# Patient Record
Sex: Male | Born: 1970 | Race: White | Hispanic: No | Marital: Single | Smoking: Never smoker
Health system: Southern US, Community
[De-identification: ages and names within clinical notes are randomized; demographics above are authoritative.]

## PROBLEM LIST (undated history)

## (undated) ENCOUNTER — Inpatient Hospital Stay: Payer: Self-pay | Admitting: *Deleted

## (undated) DIAGNOSIS — E039 Hypothyroidism, unspecified: Secondary | ICD-10-CM

## (undated) DIAGNOSIS — D229 Melanocytic nevi, unspecified: Secondary | ICD-10-CM

## (undated) DIAGNOSIS — Z87898 Personal history of other specified conditions: Secondary | ICD-10-CM

## (undated) DIAGNOSIS — J189 Pneumonia, unspecified organism: Secondary | ICD-10-CM

## (undated) DIAGNOSIS — T7840XA Allergy, unspecified, initial encounter: Secondary | ICD-10-CM

## (undated) DIAGNOSIS — E079 Disorder of thyroid, unspecified: Secondary | ICD-10-CM

## (undated) DIAGNOSIS — E220 Acromegaly and pituitary gigantism: Secondary | ICD-10-CM

## (undated) DIAGNOSIS — M25561 Pain in right knee: Secondary | ICD-10-CM

## (undated) DIAGNOSIS — E349 Endocrine disorder, unspecified: Secondary | ICD-10-CM

## (undated) DIAGNOSIS — Z9989 Dependence on other enabling machines and devices: Secondary | ICD-10-CM

## (undated) DIAGNOSIS — M1990 Unspecified osteoarthritis, unspecified site: Secondary | ICD-10-CM

## (undated) DIAGNOSIS — Z5189 Encounter for other specified aftercare: Secondary | ICD-10-CM

## (undated) DIAGNOSIS — I10 Essential (primary) hypertension: Secondary | ICD-10-CM

## (undated) DIAGNOSIS — C801 Malignant (primary) neoplasm, unspecified: Secondary | ICD-10-CM

## (undated) DIAGNOSIS — M25562 Pain in left knee: Secondary | ICD-10-CM

## (undated) HISTORY — DX: Dependence on other enabling machines and devices: Z99.89

## (undated) HISTORY — DX: Personal history of other specified conditions: Z87.898

## (undated) HISTORY — DX: Acromegaly and pituitary gigantism: E22.0

## (undated) HISTORY — DX: Disorder of thyroid, unspecified: E07.9

## (undated) HISTORY — DX: Encounter for other specified aftercare: Z51.89

## (undated) HISTORY — DX: Pain in right knee: M25.561

## (undated) HISTORY — PX: THORACIC AORTIC ANEURYSM REPAIR: SHX799

## (undated) HISTORY — PX: CRANIOTOMY FOR TUMOR: SUR345

## (undated) HISTORY — DX: Allergy, unspecified, initial encounter: T78.40XA

## (undated) HISTORY — PX: CARDIAC ELECTROPHYSIOLOGY STUDY AND ABLATION: SHX1294

## (undated) HISTORY — PX: SPINAL FUSION: SHX223

## (undated) HISTORY — DX: Endocrine disorder, unspecified: E34.9

## (undated) HISTORY — DX: Pain in left knee: M25.562

---
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
## 2005-01-20 HISTORY — PX: CERVICAL DISC SURGERY: SHX588

## 2006-04-11 ENCOUNTER — Ambulatory Visit: Payer: Self-pay | Admitting: Internal Medicine

## 2006-04-11 ENCOUNTER — Inpatient Hospital Stay (HOSPITAL_COMMUNITY): Admission: EM | Admit: 2006-04-11 | Discharge: 2006-04-14 | Payer: Self-pay | Admitting: Emergency Medicine

## 2010-05-03 ENCOUNTER — Other Ambulatory Visit: Payer: Self-pay | Admitting: Emergency Medicine

## 2010-05-03 DIAGNOSIS — J329 Chronic sinusitis, unspecified: Secondary | ICD-10-CM

## 2011-01-23 ENCOUNTER — Ambulatory Visit (INDEPENDENT_AMBULATORY_CARE_PROVIDER_SITE_OTHER): Payer: Managed Care, Other (non HMO)

## 2011-01-23 DIAGNOSIS — E220 Acromegaly and pituitary gigantism: Secondary | ICD-10-CM

## 2011-02-21 ENCOUNTER — Ambulatory Visit (INDEPENDENT_AMBULATORY_CARE_PROVIDER_SITE_OTHER): Payer: Managed Care, Other (non HMO) | Admitting: Emergency Medicine

## 2011-02-21 DIAGNOSIS — E236 Other disorders of pituitary gland: Secondary | ICD-10-CM

## 2011-02-21 DIAGNOSIS — E220 Acromegaly and pituitary gigantism: Secondary | ICD-10-CM

## 2011-02-21 MED ORDER — OCTREOTIDE ACETATE 30 MG IM KIT
30.0000 mg | PACK | INTRAMUSCULAR | Status: AC
  Administered 2011-02-21 – 2011-06-06 (×2): 30 mg via INTRAMUSCULAR

## 2011-02-24 NOTE — Progress Notes (Signed)
Patient interspersed monthly injection as ordered by his specialist that [facility]. This is for history of acromegaly. He brings the medication with him and and medications administered in our office

## 2011-04-01 ENCOUNTER — Ambulatory Visit (INDEPENDENT_AMBULATORY_CARE_PROVIDER_SITE_OTHER): Payer: Managed Care, Other (non HMO) | Admitting: Family Medicine

## 2011-04-01 DIAGNOSIS — E860 Dehydration: Secondary | ICD-10-CM

## 2011-04-01 DIAGNOSIS — E220 Acromegaly and pituitary gigantism: Secondary | ICD-10-CM | POA: Insufficient documentation

## 2011-04-01 DIAGNOSIS — R112 Nausea with vomiting, unspecified: Secondary | ICD-10-CM

## 2011-04-01 DIAGNOSIS — I429 Cardiomyopathy, unspecified: Secondary | ICD-10-CM | POA: Insufficient documentation

## 2011-04-01 DIAGNOSIS — R197 Diarrhea, unspecified: Secondary | ICD-10-CM

## 2011-04-01 DIAGNOSIS — I456 Pre-excitation syndrome: Secondary | ICD-10-CM | POA: Insufficient documentation

## 2011-04-01 DIAGNOSIS — R1110 Vomiting, unspecified: Secondary | ICD-10-CM

## 2011-04-01 LAB — POCT CBC
Granulocyte percent: 71.3 %G (ref 37–80)
HCT, POC: 40.1 % — AB (ref 43.5–53.7)
Hemoglobin: 13.1 g/dL — AB (ref 14.1–18.1)
Lymph, poc: 1.1 (ref 0.6–3.4)
MCH, POC: 31.2 pg (ref 27–31.2)
MCHC: 32.7 g/dL (ref 31.8–35.4)
MCV: 95.5 fL (ref 80–97)
MID (cbc): 0.5 (ref 0–0.9)
MPV: 8.7 fL (ref 0–99.8)
POC Granulocyte: 3.9 (ref 2–6.9)
POC LYMPH PERCENT: 19.4 %L (ref 10–50)
POC MID %: 9.3 %M (ref 0–12)
Platelet Count, POC: 146 10*3/uL (ref 142–424)
RBC: 4.2 M/uL — AB (ref 4.69–6.13)
RDW, POC: 12.8 %
WBC: 5.5 10*3/uL (ref 4.6–10.2)

## 2011-04-01 LAB — BASIC METABOLIC PANEL
BUN: 22 mg/dL (ref 6–23)
CO2: 27 mEq/L (ref 19–32)
Calcium: 9.4 mg/dL (ref 8.4–10.5)
Chloride: 98 mEq/L (ref 96–112)
Creat: 0.7 mg/dL (ref 0.50–1.35)
Glucose, Bld: 117 mg/dL — ABNORMAL HIGH (ref 70–99)
Potassium: 3.9 mEq/L (ref 3.5–5.3)
Sodium: 135 mEq/L (ref 135–145)

## 2011-04-01 MED ORDER — ONDANSETRON 4 MG PO TBDP: 4.0000 mg | ORAL_TABLET | Freq: Three times a day (TID) | ORAL | Status: AC | PRN

## 2011-04-01 NOTE — Progress Notes (Signed)
Subjective:    Patient ID: Mark Gallagher, male    DOB: 1970/05/28, 41 y.o.   MRN: [id number]  HPI  Mark Gallagher is a 41 y.o. male  Vomiting and diarrhea -6 episodes of each - 2 days ago from noon to 6pm., no vomiting or diarrhea past 2 days, but still feels fatigued, myalgias, chills and HA at times.  Decreased appetite.  Able to drink water, just food not tasting good.  Soup and saltine crackers, cheese sandwich ok.  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    No known sick contacts, no new meds/supplements.  Review of Systems  Constitutional: Positive for fatigue.  Gastrointestinal: Positive for nausea, vomiting and diarrhea.  Genitourinary: Positive for decreased urine volume. Negative for dysuria, urgency, frequency and flank pain.  Skin: Negative for rash.  Neurological: Positive for headaches. Negative for dizziness, weakness and numbness.       Generalized weak.       Objective:   Physical Exam  Constitutional: He is oriented to person, place, and time. He appears well-developed and well-nourished.        Appears fatigued, but not nontoxic.  HENT:  Head: Atraumatic. Macrocephalic.  Mouth/Throat: Mucous membranes are dry.  Eyes: Conjunctivae and EOM are normal. Pupils are equal, round, and reactive to light.  Neck: Neck supple. No thyromegaly present.  Cardiovascular: Normal rate, regular rhythm, normal heart sounds and intact distal pulses.   Pulmonary/Chest: Effort normal and breath sounds normal.  Abdominal: Soft. Bowel sounds are normal. He exhibits no distension. There is no tenderness. There is no rebound.  Lymphadenopathy:    He has no cervical adenopathy.  Neurological: He is alert and oriented to person, place, and time.  Skin: Skin is warm and dry.  Psychiatric: He has a normal mood and affect. His behavior is normal.    Results for orders placed in  visit on 04/01/11  POCT CBC      Component Value Range   WBC 5.5  4.6 - 10.2 (K/uL)   Lymph, poc 1.1  0.6 - 3.4    POC LYMPH PERCENT 19.4  10 - 50 (%L)   MID (cbc) 0.5  0 - 0.9    POC MID % 9.3  0 - 12 (%M)   POC Granulocyte 3.9  2 - 6.9    Granulocyte percent 71.3  37 - 80 (%G)   RBC 4.20 (*) 4.69 - 6.13 (M/uL)   Hemoglobin 13.1 (*) 14.1 - 18.1 (g/dL)   HCT, POC 40.9 (*) 81.1 - 53.7 (%)   MCV 95.5  80 - 97 (fL)   MCH, POC 31.2  27 - 31.2 (pg)   MCHC 32.7  31.8 - 35.4 (g/dL)   RDW, POC 91.4     Platelet Count, POC 146  142 - 424 (K/uL)   MPV 8.7  0 - 99.8 (fL)         Assessment & Plan:   Mark Gallagher is a 41 y.o. male  1. Vomiting  POCT CBC, Basic metabolic panel  2. Diarrhea  POCT CBC, Basic metabolic panel  3. Volume depletion      Resolved diarrhea, vomiting.  Suspected gastroenteritis with residual fatigue, volume depletion. Tolerating po fluids, and few solids.  Reassuring CBC.  Check BMP.  Zofran 4mg  q8h prn, push fluids/electrolyte drinks, slow resumption of solids, BRAT diet. Out of work for 3 days -  can return sooner if feeling well.  RTC if not improving, or sooner if worsening.  Borderline hemglobin - recheck next 1 month, sooner if new symptoms.

## 2011-04-01 NOTE — Patient Instructions (Signed)
Continue to slowly increase your food intake - Bananas, rice, applesauce, toast ok to start first.  Continue to drink plenty of fluids, and rest as needed.  If you are not improving in the next 2-3 days - return to clinic, sooner or to an emergency room if any acute worsening. Dehydration, Adult Dehydration is when you lose more fluids from the body than you take in. Vital organs like the kidneys, brain, and heart cannot function without a proper amount of fluids and salt. Any loss of fluids from the body can cause dehydration.  CAUSES   Vomiting.   Diarrhea.   Excessive sweating.   Excessive urine output.   Fever.  SYMPTOMS  Mild dehydration  Thirst.   Dry lips.   Slightly dry mouth.  Moderate dehydration  Very dry mouth.   Sunken eyes.   Skin does not bounce back quickly when lightly pinched and released.   Dark urine and decreased urine production.   Decreased tear production.   Headache.  Severe dehydration  Very dry mouth.   Extreme thirst.   Rapid, weak pulse (more than 100 beats per minute at rest).   Cold hands and feet.   Not able to sweat in spite of heat and temperature.   Rapid breathing.   Blue lips.   Confusion and lethargy.   Difficulty being awakened.   Minimal urine production.   No tears.  DIAGNOSIS  Your caregiver will diagnose dehydration based on your symptoms and your exam. Blood and urine tests will help confirm the diagnosis. The diagnostic evaluation should also identify the cause of dehydration. TREATMENT  Treatment of mild or moderate dehydration can often be done at home by increasing the amount of fluids that you drink. It is best to drink small amounts of fluid more often. Drinking too much at one time can make vomiting worse. Refer to the home care instructions below. Severe dehydration needs to be treated at the hospital where you will probably be given intravenous (IV) fluids that contain water and electrolytes. HOME CARE  INSTRUCTIONS   Ask your caregiver about specific rehydration instructions.   Drink enough fluids to keep your urine clear or pale yellow.   Drink small amounts frequently if you have nausea and vomiting.   Eat as you normally do.   Avoid:   Foods or drinks high in sugar.   Carbonated drinks.   Juice.   Extremely hot or cold fluids.   Drinks with caffeine.   Fatty, greasy foods.   Alcohol.   Tobacco.   Overeating.   Gelatin desserts.   Wash your hands well to avoid spreading bacteria and viruses.   Only take over-the-counter or prescription medicines for pain, discomfort, or fever as directed by your caregiver.   Ask your caregiver if you should continue all prescribed and over-the-counter medicines.   Keep all follow-up appointments with your caregiver.  SEEK MEDICAL CARE IF:  You have abdominal pain and it increases or stays in one area (localizes).   You have a rash, stiff neck, or severe headache.   You are irritable, sleepy, or difficult to awaken.   You are weak, dizzy, or extremely thirsty.  SEEK IMMEDIATE MEDICAL CARE IF:   You are unable to keep fluids down or you get worse despite treatment.   You have frequent episodes of vomiting or diarrhea.   You have blood or green matter (bile) in your vomit.   You have blood in your stool or your stool looks black  and tarry.   You have not urinated in 6 to 8 hours, or you have only urinated a small amount of very dark urine.   You have a fever.   You faint.  MAKE SURE YOU:   Understand these instructions.   Will watch your condition.   Will get help right away if you are not doing well or get worse.  Document Released: 01/06/2005 Document Revised: 12/26/2010 Document Reviewed: 08/26/2010 [facility] Patient Information 2012 [place], [place].

## 2011-04-08 ENCOUNTER — Ambulatory Visit (INDEPENDENT_AMBULATORY_CARE_PROVIDER_SITE_OTHER): Payer: Managed Care, Other (non HMO) | Admitting: Physician Assistant

## 2011-04-08 ENCOUNTER — Other Ambulatory Visit: Payer: Self-pay | Admitting: Emergency Medicine

## 2011-04-08 DIAGNOSIS — E220 Acromegaly and pituitary gigantism: Secondary | ICD-10-CM

## 2011-04-08 DIAGNOSIS — E291 Testicular hypofunction: Secondary | ICD-10-CM

## 2011-04-08 MED ORDER — OCTREOTIDE ACETATE 30 MG IM KIT
30.0000 mg | PACK | Freq: Once | INTRAMUSCULAR | Status: AC
  Administered 2011-04-08: 30 mg via INTRAMUSCULAR

## 2011-04-08 MED ORDER — OCTREOTIDE ACETATE 30 MG IM KIT: 30.0000 mg | PACK | INTRAMUSCULAR | Status: DC

## 2011-04-11 ENCOUNTER — Ambulatory Visit: Payer: Managed Care, Other (non HMO) | Admitting: *Deleted

## 2011-04-21 ENCOUNTER — Other Ambulatory Visit: Payer: Self-pay

## 2011-04-21 DIAGNOSIS — E291 Testicular hypofunction: Secondary | ICD-10-CM

## 2011-05-09 ENCOUNTER — Ambulatory Visit (INDEPENDENT_AMBULATORY_CARE_PROVIDER_SITE_OTHER): Payer: Managed Care, Other (non HMO) | Admitting: Physician Assistant

## 2011-05-09 ENCOUNTER — Other Ambulatory Visit: Payer: Self-pay | Admitting: Emergency Medicine

## 2011-05-09 DIAGNOSIS — E220 Acromegaly and pituitary gigantism: Secondary | ICD-10-CM

## 2011-05-09 MED ORDER — OCTREOTIDE ACETATE 30 MG IM KIT
30.0000 mg | PACK | Freq: Once | INTRAMUSCULAR | Status: AC
  Administered 2011-05-09: 30 mg via INTRAMUSCULAR

## 2011-06-06 ENCOUNTER — Ambulatory Visit (INDEPENDENT_AMBULATORY_CARE_PROVIDER_SITE_OTHER): Payer: Managed Care, Other (non HMO) | Admitting: Physician Assistant

## 2011-06-06 ENCOUNTER — Other Ambulatory Visit: Payer: Self-pay | Admitting: Family Medicine

## 2011-06-06 DIAGNOSIS — E220 Acromegaly and pituitary gigantism: Secondary | ICD-10-CM

## 2011-06-06 DIAGNOSIS — I456 Pre-excitation syndrome: Secondary | ICD-10-CM

## 2011-06-06 MED ORDER — OCTREOTIDE ACETATE 30 MG IM KIT
30.0000 mg | PACK | INTRAMUSCULAR | Status: AC
  Administered 2011-07-11 – 2012-09-06 (×8): 30 mg via INTRAMUSCULAR

## 2011-06-06 MED ORDER — OCTREOTIDE ACETATE 30 MG IM KIT
30.0000 mg | PACK | Freq: Once | INTRAMUSCULAR | Status: AC
  Administered 2013-02-08: 30 mg via INTRAMUSCULAR

## 2011-06-06 MED ORDER — OCTREOTIDE ACETATE 30 MG IM KIT: 30.0000 mg | PACK | Freq: Once | INTRAMUSCULAR | Status: DC

## 2011-07-11 ENCOUNTER — Ambulatory Visit (INDEPENDENT_AMBULATORY_CARE_PROVIDER_SITE_OTHER): Payer: Managed Care, Other (non HMO) | Admitting: Physician Assistant

## 2011-07-11 DIAGNOSIS — E220 Acromegaly and pituitary gigantism: Secondary | ICD-10-CM

## 2011-08-08 ENCOUNTER — Ambulatory Visit (INDEPENDENT_AMBULATORY_CARE_PROVIDER_SITE_OTHER): Payer: Managed Care, Other (non HMO) | Admitting: Physician Assistant

## 2011-08-08 DIAGNOSIS — E220 Acromegaly and pituitary gigantism: Secondary | ICD-10-CM

## 2011-09-05 ENCOUNTER — Ambulatory Visit (INDEPENDENT_AMBULATORY_CARE_PROVIDER_SITE_OTHER): Payer: Managed Care, Other (non HMO) | Admitting: Physician Assistant

## 2011-09-05 DIAGNOSIS — E220 Acromegaly and pituitary gigantism: Secondary | ICD-10-CM

## 2011-10-01 ENCOUNTER — Ambulatory Visit (INDEPENDENT_AMBULATORY_CARE_PROVIDER_SITE_OTHER): Payer: Managed Care, Other (non HMO) | Admitting: Physician Assistant

## 2011-10-01 DIAGNOSIS — E220 Acromegaly and pituitary gigantism: Secondary | ICD-10-CM

## 2011-10-01 MED ORDER — OCTREOTIDE ACETATE 30 MG IM KIT
30.0000 mg | PACK | Freq: Once | INTRAMUSCULAR | Status: AC
  Administered 2011-10-01: 30 mg via INTRAMUSCULAR

## 2011-10-01 MED ORDER — OCTREOTIDE ACETATE 30 MG IM KIT: 30.0000 mg | PACK | INTRAMUSCULAR | Status: DC

## 2011-11-07 ENCOUNTER — Ambulatory Visit (INDEPENDENT_AMBULATORY_CARE_PROVIDER_SITE_OTHER): Payer: Managed Care, Other (non HMO) | Admitting: *Deleted

## 2011-11-07 DIAGNOSIS — E220 Acromegaly and pituitary gigantism: Secondary | ICD-10-CM

## 2011-11-07 MED ORDER — OCTREOTIDE ACETATE 30 MG IM KIT: 30.0000 mg | PACK | INTRAMUSCULAR | Status: DC

## 2011-11-07 MED ORDER — OCTREOTIDE ACETATE 30 MG IM KIT
30.0000 mg | PACK | Freq: Once | INTRAMUSCULAR | Status: AC
  Administered 2011-11-07: 30 mg via INTRAMUSCULAR

## 2011-11-11 NOTE — Progress Notes (Signed)
  Subjective:    Patient ID: Mark Gallagher, male    DOB: 08/30/1970, 41 y.o.   MRN: [id number]  HPI    Review of Systems     Objective:   Physical Exam        Assessment & Plan:  Patient here for injection only 

## 2011-12-11 ENCOUNTER — Ambulatory Visit (INDEPENDENT_AMBULATORY_CARE_PROVIDER_SITE_OTHER): Payer: Managed Care, Other (non HMO) | Admitting: *Deleted

## 2011-12-11 DIAGNOSIS — E220 Acromegaly and pituitary gigantism: Secondary | ICD-10-CM

## 2011-12-11 MED ORDER — OCTREOTIDE ACETATE 30 MG IM KIT
30.0000 mg | PACK | Freq: Once | INTRAMUSCULAR | Status: AC
  Administered 2011-12-11: 30 mg via INTRAMUSCULAR

## 2011-12-11 MED ORDER — OCTREOTIDE ACETATE 30 MG IM KIT: 30.0000 mg | PACK | INTRAMUSCULAR | Status: DC

## 2012-01-06 ENCOUNTER — Ambulatory Visit (INDEPENDENT_AMBULATORY_CARE_PROVIDER_SITE_OTHER): Payer: Managed Care, Other (non HMO) | Admitting: *Deleted

## 2012-01-06 DIAGNOSIS — E220 Acromegaly and pituitary gigantism: Secondary | ICD-10-CM

## 2012-01-06 MED ORDER — OCTREOTIDE ACETATE 30 MG IM KIT
30.0000 mg | PACK | Freq: Once | INTRAMUSCULAR | Status: AC
  Administered 2012-01-06: 30 mg via INTRAMUSCULAR

## 2012-01-06 NOTE — Progress Notes (Signed)
  Subjective:    Patient ID: Mark Gallagher, male    DOB: [birth year], 41 y.o.   MRN: [id number]  HPI    Review of Systems     Objective:   Physical Exam        Assessment & Plan:  Patient here for Sandostatin LAR depot 30 mg  Injection only.

## 2012-01-18 ENCOUNTER — Ambulatory Visit (INDEPENDENT_AMBULATORY_CARE_PROVIDER_SITE_OTHER): Payer: Managed Care, Other (non HMO) | Admitting: Family Medicine

## 2012-01-18 ENCOUNTER — Encounter: Payer: Self-pay | Admitting: Family Medicine

## 2012-01-18 VITALS — BP 113/73 | HR 80 | Temp 98.0°F | Resp 18 | Ht 79.0 in | Wt 237.8 lb

## 2012-01-18 DIAGNOSIS — J069 Acute upper respiratory infection, unspecified: Secondary | ICD-10-CM

## 2012-01-18 DIAGNOSIS — J029 Acute pharyngitis, unspecified: Secondary | ICD-10-CM

## 2012-01-18 LAB — POCT RAPID STREP A (OFFICE): Rapid Strep A Screen: NEGATIVE

## 2012-01-18 MED ORDER — HYDROCOD POLST-CHLORPHEN POLST 10-8 MG/5ML PO LQCR: 5.0000 mL | Freq: Two times a day (BID) | ORAL | Status: DC | PRN

## 2012-01-18 NOTE — Progress Notes (Signed)
Subjective:    Patient ID: Mark Gallagher, male    DOB: 01/22/1970, 41 y.o.   MRN: [id number] Chief Complaint  Patient presents with  . Cough    mostly dry cough nasal congestion scratchy throat x 2 days    HPI  2d prev dev sore throat which has progressively worsened and developed nasal congestion and chest congestion, now more cough.  Has a h/o pneumonia.  Low grade fever, just illness, fatigue, sleeping a lot.  No myagias/arthralgias, no GI/GU sxs, no chills, no ear pain, HAs or sinus pressure.  No past medical history on file. Current Outpatient Prescriptions on File Prior to Visit  Medication Sig Dispense Refill  . levothyroxine (SYNTHROID, LEVOTHROID) 200 MCG tablet Take 200 mcg by mouth daily.      Marland Kitchen octreotide (SANDOSTATIN LAR DEPOT) 30 MG injection Inject 30 mg into the muscle every 28 (twenty-eight) days.  1 each  0   Current Facility-Administered Medications on File Prior to Visit  Medication Dose Route Frequency Provider Last Rate Last Dose  . octreotide (SANDOSTATIN LAR) IM injection 30 mg  30 mg Intramuscular Q30 days Collene Gobble, MD   30 mg at 06/06/11 1215  . octreotide (SANDOSTATIN LAR) IM injection 30 mg  30 mg Intramuscular Once Collene Gobble, MD      . octreotide (SANDOSTATIN LAR) IM injection 30 mg  30 mg Intramuscular Q30 days Collene Gobble, MD   30 mg at 09/05/11 1130   No Known Allergies  Review of Systems  Constitutional: Positive for fever, activity change and fatigue. Negative for chills, diaphoresis and appetite change.  HENT: Positive for congestion, sore throat and rhinorrhea. Negative for ear pain, trouble swallowing, neck pain, neck stiffness, voice change and sinus pressure.   Respiratory: Positive for cough. Negative for shortness of breath.   Gastrointestinal: Negative for nausea, vomiting and abdominal pain.  Genitourinary: Negative for dysuria and decreased urine volume.  Musculoskeletal: Negative for myalgias and arthralgias.  Neurological:  Negative for headaches.  Psychiatric/Behavioral: Negative for sleep disturbance.      BP 113/73  Pulse 80  Temp 98 F (36.7 C) (Oral)  Resp 18  Ht 6\' 7"  (2.007 m)  Wt 237 lb 12.8 oz (107.865 kg)  BMI 26.79 kg/m2  SpO2 97% Objective:   Physical Exam  Constitutional: He is oriented to person, place, and time. He appears well-developed and well-nourished. No distress.  HENT:  Head: Normocephalic and atraumatic.  Right Ear: Tympanic membrane, external ear and ear canal normal.  Left Ear: Tympanic membrane, external ear and ear canal normal.  Nose: Nose normal.  Mouth/Throat: Uvula is midline and mucous membranes are normal. Oropharyngeal exudate, posterior oropharyngeal edema and posterior oropharyngeal erythema present.  Eyes: Conjunctivae normal are normal. No scleral icterus.  Neck: Normal range of motion. Neck supple. No thyromegaly present.  Cardiovascular: Normal rate, regular rhythm, normal heart sounds and intact distal pulses.   Pulmonary/Chest: Effort normal and breath sounds normal. No respiratory distress.  Musculoskeletal: He exhibits no edema.  Lymphadenopathy:    He has no cervical adenopathy.  Neurological: He is alert and oriented to person, place, and time.  Skin: Skin is warm and dry. He is not diaphoretic. No erythema.  Psychiatric: He has a normal mood and affect. His behavior is normal.       Results for orders placed in visit on 01/18/12  POCT RAPID STREP A (OFFICE)      Component Value Range   Rapid Strep A Screen Negative  Negative    Assessment & Plan:   1. Pharyngitis  POCT rapid strep A  2. URI, acute  chlorpheniramine-HYDROcodone (TUSSIONEX PENNKINETIC ER) 10-8 MG/5ML LQCR  Suspect virally mediated.  Push fluids, rest, and treat symptomatically.  RTC if not improving within several days or if worsening.

## 2012-01-28 ENCOUNTER — Ambulatory Visit (INDEPENDENT_AMBULATORY_CARE_PROVIDER_SITE_OTHER): Payer: Managed Care, Other (non HMO) | Admitting: *Deleted

## 2012-01-28 ENCOUNTER — Ambulatory Visit: Payer: Managed Care, Other (non HMO)

## 2012-01-28 ENCOUNTER — Other Ambulatory Visit: Payer: Self-pay | Admitting: Emergency Medicine

## 2012-01-28 DIAGNOSIS — E220 Acromegaly and pituitary gigantism: Secondary | ICD-10-CM

## 2012-01-28 MED ORDER — OCTREOTIDE ACETATE 30 MG IM KIT
30.0000 mg | PACK | INTRAMUSCULAR | Status: DC
  Administered 2012-01-28: 30 mg via INTRAMUSCULAR

## 2012-01-29 DIAGNOSIS — E349 Endocrine disorder, unspecified: Secondary | ICD-10-CM | POA: Insufficient documentation

## 2012-02-27 ENCOUNTER — Ambulatory Visit (INDEPENDENT_AMBULATORY_CARE_PROVIDER_SITE_OTHER): Payer: Managed Care, Other (non HMO) | Admitting: *Deleted

## 2012-02-27 DIAGNOSIS — E220 Acromegaly and pituitary gigantism: Secondary | ICD-10-CM

## 2012-02-27 MED ORDER — OCTREOTIDE ACETATE 30 MG IM KIT
30.0000 mg | PACK | Freq: Once | INTRAMUSCULAR | Status: AC
  Administered 2012-02-27: 30 mg via INTRAMUSCULAR

## 2012-04-07 ENCOUNTER — Ambulatory Visit (INDEPENDENT_AMBULATORY_CARE_PROVIDER_SITE_OTHER): Payer: Managed Care, Other (non HMO) | Admitting: *Deleted

## 2012-04-07 DIAGNOSIS — E220 Acromegaly and pituitary gigantism: Secondary | ICD-10-CM

## 2012-05-11 ENCOUNTER — Ambulatory Visit (INDEPENDENT_AMBULATORY_CARE_PROVIDER_SITE_OTHER): Payer: Managed Care, Other (non HMO) | Admitting: *Deleted

## 2012-05-11 DIAGNOSIS — E220 Acromegaly and pituitary gigantism: Secondary | ICD-10-CM

## 2012-05-11 NOTE — Progress Notes (Signed)
  Subjective:    Patient ID: Mark Gallagher, male    DOB: 29-Aug-1970, 42 y.o.   MRN: [id number]  HPI    Review of Systems     Objective:   Physical Exam        Assessment & Plan:  Patient here for injection only

## 2012-06-02 ENCOUNTER — Ambulatory Visit (INDEPENDENT_AMBULATORY_CARE_PROVIDER_SITE_OTHER): Payer: Managed Care, Other (non HMO) | Admitting: *Deleted

## 2012-06-02 DIAGNOSIS — E220 Acromegaly and pituitary gigantism: Secondary | ICD-10-CM

## 2012-06-02 MED ORDER — OCTREOTIDE ACETATE 30 MG IM KIT
30.0000 mg | PACK | Freq: Once | INTRAMUSCULAR | Status: AC
  Administered 2012-06-02: 30 mg via INTRAMUSCULAR

## 2012-06-02 NOTE — Progress Notes (Signed)
  Subjective:    Patient ID: Mark Gallagher, male    DOB: [birth year], 42 y.o.   MRN: [id number]  HPI    Review of Systems     Objective:   Physical Exam        Assessment & Plan:  Patient seen for injection only.

## 2012-07-02 ENCOUNTER — Ambulatory Visit (INDEPENDENT_AMBULATORY_CARE_PROVIDER_SITE_OTHER): Payer: Managed Care, Other (non HMO) | Admitting: Internal Medicine

## 2012-07-02 ENCOUNTER — Ambulatory Visit (INDEPENDENT_AMBULATORY_CARE_PROVIDER_SITE_OTHER): Payer: Managed Care, Other (non HMO) | Admitting: *Deleted

## 2012-07-02 DIAGNOSIS — E220 Acromegaly and pituitary gigantism: Secondary | ICD-10-CM

## 2012-07-02 DIAGNOSIS — Z209 Contact with and (suspected) exposure to unspecified communicable disease: Secondary | ICD-10-CM

## 2012-07-02 NOTE — Progress Notes (Signed)
He asked for a quick visit to discuss recent complications from a relationship problem Relationship for 3 years with a woman who is an alcoholic and smokes Finally culminated with his first sexual activity ever in January 2014 She is now pregnant The relationship had dissolved before finding out No outside partners were involved He has no STD symptoms He has questions about proof of paternity, the best course of action would not wanting to be a father, ways to handle his family's disappointment, ways to limit any damage to the fetus from alcohol or cigarettes  He will obtain legal counsel No need for paternity testing at this point DSS may need to be involved She has been spotting for the past 2 days and is referred to [facility] to rule out spontaneous abortion  He may follow up as needed

## 2012-07-02 NOTE — Progress Notes (Signed)
  Subjective:    Patient ID: Mark Gallagher, male    DOB: 03-16-1970, 42 y.o.   MRN: [id number]  HPI    Review of Systems     Objective:   Physical Exam        Assessment & Plan:  Patient here for injection only

## 2012-08-09 ENCOUNTER — Ambulatory Visit (INDEPENDENT_AMBULATORY_CARE_PROVIDER_SITE_OTHER): Payer: Managed Care, Other (non HMO) | Admitting: *Deleted

## 2012-08-09 DIAGNOSIS — E220 Acromegaly and pituitary gigantism: Secondary | ICD-10-CM

## 2012-08-09 NOTE — Progress Notes (Signed)
  Subjective:    Patient ID: Mark Gallagher, male    DOB: 09-19-1970, 42 y.o.   MRN: [id number]  HPI    Review of Systems     Objective:   Physical Exam        Assessment & Plan:  Patient here for Sandostatin LAR Depot 30 mg injection only.

## 2012-08-17 ENCOUNTER — Other Ambulatory Visit: Payer: Self-pay | Admitting: *Deleted

## 2012-08-17 DIAGNOSIS — E220 Acromegaly and pituitary gigantism: Secondary | ICD-10-CM

## 2012-08-17 MED ORDER — OCTREOTIDE ACETATE 30 MG IM KIT
30.0000 mg | PACK | INTRAMUSCULAR | Status: DC
  Administered 2012-12-03 – 2014-09-07 (×11): 30 mg via INTRAMUSCULAR

## 2012-08-23 MED ORDER — OCTREOTIDE ACETATE 30 MG IM KIT
30.0000 mg | PACK | Freq: Once | INTRAMUSCULAR | Status: AC
  Administered 2012-11-05: 30 mg via INTRAMUSCULAR

## 2012-08-23 NOTE — Progress Notes (Signed)
  Subjective:    Patient ID: Mark Gallagher, male    DOB: Aug 23, 1970, 42 y.o.   MRN: [id number]  HPI    Review of Systems     Objective:   Physical Exampatient here for injection only        Assessment & Plan:

## 2012-09-06 ENCOUNTER — Ambulatory Visit (INDEPENDENT_AMBULATORY_CARE_PROVIDER_SITE_OTHER): Payer: Managed Care, Other (non HMO) | Admitting: *Deleted

## 2012-09-06 DIAGNOSIS — E220 Acromegaly and pituitary gigantism: Secondary | ICD-10-CM

## 2012-09-06 NOTE — Progress Notes (Signed)
  Subjective:    Patient ID: Mark Gallagher, male    DOB: [birth year], 42 y.o.   MRN: [id number]  HPI    Review of Systems     Objective:   Physical Exam        Assessment & Plan:  Patient here for Sandostatin LAR injection only.

## 2012-10-06 ENCOUNTER — Ambulatory Visit (INDEPENDENT_AMBULATORY_CARE_PROVIDER_SITE_OTHER): Payer: Managed Care, Other (non HMO) | Admitting: *Deleted

## 2012-10-06 ENCOUNTER — Encounter: Payer: Self-pay | Admitting: *Deleted

## 2012-10-06 VITALS — BP 100/60 | Temp 97.9°F

## 2012-10-06 DIAGNOSIS — E220 Acromegaly and pituitary gigantism: Secondary | ICD-10-CM

## 2012-10-06 NOTE — Progress Notes (Signed)
  Subjective:    Patient ID: Mark Gallagher, male    DOB: May 11, 1970, 42 y.o.   MRN: [id number]  HPI    Review of Systems     Objective:   Physical Exam        Assessment & Plan:  Patient here for injection only.

## 2012-10-20 ENCOUNTER — Ambulatory Visit (INDEPENDENT_AMBULATORY_CARE_PROVIDER_SITE_OTHER): Payer: Managed Care, Other (non HMO) | Admitting: *Deleted

## 2012-10-20 DIAGNOSIS — E291 Testicular hypofunction: Secondary | ICD-10-CM

## 2012-10-20 MED ORDER — TESTOSTERONE CYPIONATE 200 MG/ML IM SOLN
200.0000 mg | INTRAMUSCULAR | Status: DC
  Administered 2012-10-20 – 2012-12-31 (×3): 200 mg via INTRAMUSCULAR

## 2012-10-20 NOTE — Progress Notes (Signed)
  Subjective:    Patient ID: Mark Gallagher, male    DOB: 17-May-1970, 42 y.o.   MRN: [id number]  HPI    Review of Systems     Objective:   Physical Exam        Assessment & Plan:  Patient here for Testosterone injection only.

## 2012-10-20 NOTE — Progress Notes (Signed)
Needs to get T im 200 q 21 d as Mom too old now to give it Meds ordered this encounter  Medications  . testosterone cypionate (DEPOTESTOTERONE CYPIONATE) injection 200 mg    Sig: q 21 days

## 2012-11-05 ENCOUNTER — Ambulatory Visit (INDEPENDENT_AMBULATORY_CARE_PROVIDER_SITE_OTHER): Payer: Managed Care, Other (non HMO) | Admitting: *Deleted

## 2012-11-05 VITALS — BP 112/80 | Temp 98.2°F

## 2012-11-05 DIAGNOSIS — E291 Testicular hypofunction: Secondary | ICD-10-CM

## 2012-11-05 DIAGNOSIS — E220 Acromegaly and pituitary gigantism: Secondary | ICD-10-CM

## 2012-11-05 NOTE — Progress Notes (Signed)
  Subjective:    Patient ID: Mark Gallagher, male    DOB: [birth year], 42 y.o.   MRN: [id number]  HPI    Review of Systems     Objective:   Physical Exam        Assessment & Plan:  Patient here for Sandostatin LAR and testosterone injections only.

## 2012-12-03 ENCOUNTER — Ambulatory Visit (INDEPENDENT_AMBULATORY_CARE_PROVIDER_SITE_OTHER): Payer: Managed Care, Other (non HMO) | Admitting: *Deleted

## 2012-12-03 DIAGNOSIS — E220 Acromegaly and pituitary gigantism: Secondary | ICD-10-CM

## 2012-12-03 NOTE — Progress Notes (Signed)
  Subjective:    Patient ID: Mark Gallagher, male    DOB: 08/13/1970, 42 y.o.   MRN: [id number]  HPI    Review of Systems     Objective:   Physical Exam        Assessment & Plan:  Patient here for Sandostatin LAR injection only

## 2012-12-31 ENCOUNTER — Encounter: Payer: Self-pay | Admitting: *Deleted

## 2012-12-31 ENCOUNTER — Ambulatory Visit (INDEPENDENT_AMBULATORY_CARE_PROVIDER_SITE_OTHER): Payer: Managed Care, Other (non HMO) | Admitting: *Deleted

## 2012-12-31 VITALS — Temp 97.7°F

## 2012-12-31 DIAGNOSIS — E220 Acromegaly and pituitary gigantism: Secondary | ICD-10-CM

## 2012-12-31 NOTE — Progress Notes (Signed)
   Subjective:    Patient ID: Mark Gallagher, male    DOB: August 01, 1970, 42 y.o.   MRN: [id number]  HPI patient here for testosterone injection only.    Review of Systems     Objective:   Physical Exam        Assessment & Plan:

## 2013-02-08 ENCOUNTER — Ambulatory Visit (INDEPENDENT_AMBULATORY_CARE_PROVIDER_SITE_OTHER): Payer: Managed Care, Other (non HMO) | Admitting: *Deleted

## 2013-02-08 DIAGNOSIS — E220 Acromegaly and pituitary gigantism: Secondary | ICD-10-CM

## 2013-02-08 NOTE — Progress Notes (Signed)
   Subjective:    Patient ID: Mark Gallagher, male    DOB: [birth year], 43 y.o.   MRN: [id number]  HPI    Review of Systems     Objective:   Physical Exam        Assessment & Plan:  Patient here for 30 mg Sandostatin LAR Depot injection only.

## 2013-02-23 ENCOUNTER — Ambulatory Visit (INDEPENDENT_AMBULATORY_CARE_PROVIDER_SITE_OTHER): Payer: Managed Care, Other (non HMO) | Admitting: Family Medicine

## 2013-02-23 VITALS — BP 92/60 | HR 50 | Temp 97.6°F | Resp 16 | Ht >= 80 in | Wt 239.0 lb

## 2013-02-23 DIAGNOSIS — R112 Nausea with vomiting, unspecified: Secondary | ICD-10-CM

## 2013-02-23 DIAGNOSIS — R509 Fever, unspecified: Secondary | ICD-10-CM

## 2013-02-23 DIAGNOSIS — E860 Dehydration: Secondary | ICD-10-CM

## 2013-02-23 LAB — POCT CBC
Granulocyte percent: 60.6 %G (ref 37–80)
HCT, POC: 41.8 % — AB (ref 43.5–53.7)
Hemoglobin: 13.3 g/dL — AB (ref 14.1–18.1)
Lymph, poc: 1.3 (ref 0.6–3.4)
MCH, POC: 31.2 pg (ref 27–31.2)
MCHC: 31.8 g/dL (ref 31.8–35.4)
MCV: 98.2 fL — AB (ref 80–97)
MID (cbc): 0.4 (ref 0–0.9)
MPV: 9 fL (ref 0–99.8)
POC Granulocyte: 2.5 (ref 2–6.9)
POC LYMPH PERCENT: 30.2 %L (ref 10–50)
POC MID %: 9.2 %M (ref 0–12)
Platelet Count, POC: 138 10*3/uL — AB (ref 142–424)
RBC: 4.26 M/uL — AB (ref 4.69–6.13)
RDW, POC: 12.7 %
WBC: 4.3 10*3/uL — AB (ref 4.6–10.2)

## 2013-02-23 MED ORDER — IBUPROFEN 200 MG PO TABS: 400.0000 mg | ORAL_TABLET | Freq: Once | ORAL | Status: DC

## 2013-02-23 MED ORDER — ONDANSETRON HCL 8 MG PO TABS: 8.0000 mg | ORAL_TABLET | Freq: Three times a day (TID) | ORAL | Status: DC | PRN

## 2013-02-23 NOTE — Patient Instructions (Addendum)
Thank you for coming in today  We have given you 2 L of fluids Take ibuprofen or tylenol as needed for headache, fever, body aches, sore throat Try to continue to drink fluids - pedialyte is best. Gatorade and water next best I sent ondansetron (zofran) to your pharmacy to take as needed for nausea  Please return tomorrow for recheck or go to ER if your condition worsens (persistent vomiting, increased lethargy or confusion)  Nausea and Vomiting Nausea is a sick feeling that often comes before throwing up (vomiting). Vomiting is a reflex where stomach contents come out of your mouth. Vomiting can cause severe loss of body fluids (dehydration). Children and elderly adults can become dehydrated quickly, especially if they also have diarrhea. Nausea and vomiting are symptoms of a condition or disease. It is important to find the cause of your symptoms. CAUSES   Direct irritation of the stomach lining. This irritation can result from increased acid production (gastroesophageal reflux disease), infection, food poisoning, taking certain medicines (such as nonsteroidal anti-inflammatory drugs), alcohol use, or tobacco use.  Signals from the brain.These signals could be caused by a headache, heat exposure, an inner ear disturbance, increased pressure in the brain from injury, infection, a tumor, or a concussion, pain, emotional stimulus, or metabolic problems.  An obstruction in the gastrointestinal tract (bowel obstruction).  Illnesses such as diabetes, hepatitis, gallbladder problems, appendicitis, kidney problems, cancer, sepsis, atypical symptoms of a heart attack, or eating disorders.  Medical treatments such as chemotherapy and radiation.  Receiving medicine that makes you sleep (general anesthetic) during surgery. DIAGNOSIS Your caregiver may ask for tests to be done if the problems do not improve after a few days. Tests may also be done if symptoms are severe or if the reason for the nausea  and vomiting is not clear. Tests may include:  Urine tests.  Blood tests.  Stool tests.  Cultures (to look for evidence of infection).  X-rays or other imaging studies. Test results can help your caregiver make decisions about treatment or the need for additional tests. TREATMENT You need to stay well hydrated. Drink frequently but in small amounts.You may wish to drink water, sports drinks, clear broth, or eat frozen ice pops or gelatin dessert to help stay hydrated.When you eat, eating slowly may help prevent nausea.There are also some antinausea medicines that may help prevent nausea. HOME CARE INSTRUCTIONS   Take all medicine as directed by your caregiver.  If you do not have an appetite, do not force yourself to eat. However, you must continue to drink fluids.  If you have an appetite, eat a normal diet unless your caregiver tells you differently.  Eat a variety of complex carbohydrates (rice, wheat, potatoes, bread), lean meats, yogurt, fruits, and vegetables.  Avoid high-fat foods because they are more difficult to digest.  Drink enough water and fluids to keep your urine clear or pale yellow.  If you are dehydrated, ask your caregiver for specific rehydration instructions. Signs of dehydration may include:  Severe thirst.  Dry lips and mouth.  Dizziness.  Dark urine.  Decreasing urine frequency and amount.  Confusion.  Rapid breathing or pulse. SEEK IMMEDIATE MEDICAL CARE IF:   You have blood or brown flecks (like coffee grounds) in your vomit.  You have black or bloody stools.  You have a severe headache or stiff neck.  You are confused.  You have severe abdominal pain.  You have chest pain or trouble breathing.  You do not urinate at  least once every 8 hours.  You develop cold or clammy skin.  You continue to vomit for longer than 24 to 48 hours.  You have a fever. MAKE SURE YOU:   Understand these instructions.  Will watch your  condition.  Will get help right away if you are not doing well or get worse. Document Released: 01/06/2005 Document Revised: 03/31/2011 Document Reviewed: 06/05/2010 [facility] Patient Information 2014 [place], [place].  Dehydration, Adult Dehydration is when you lose more fluids from the body than you take in. Vital organs like the kidneys, brain, and heart cannot function without a proper amount of fluids and salt. Any loss of fluids from the body can cause dehydration.  CAUSES   Vomiting.  Diarrhea.  Excessive sweating.  Excessive urine output.  Fever. SYMPTOMS  Mild dehydration  Thirst.  Dry lips.  Slightly dry mouth. Moderate dehydration  Very dry mouth.  Sunken eyes.  Skin does not bounce back quickly when lightly pinched and released.  Dark urine and decreased urine production.  Decreased tear production.  Headache. Severe dehydration  Very dry mouth.  Extreme thirst.  Rapid, weak pulse (more than 100 beats per minute at rest).  Cold hands and feet.  Not able to sweat in spite of heat and temperature.  Rapid breathing.  Blue lips.  Confusion and lethargy.  Difficulty being awakened.  Minimal urine production.  No tears. DIAGNOSIS  Your caregiver will diagnose dehydration based on your symptoms and your exam. Blood and urine tests will help confirm the diagnosis. The diagnostic evaluation should also identify the cause of dehydration. TREATMENT  Treatment of mild or moderate dehydration can often be done at home by increasing the amount of fluids that you drink. It is best to drink small amounts of fluid more often. Drinking too much at one time can make vomiting worse. Refer to the home care instructions below. Severe dehydration needs to be treated at the hospital where you will probably be given intravenous (IV) fluids that contain water and electrolytes. HOME CARE INSTRUCTIONS   Ask your caregiver about specific rehydration  instructions.  Drink enough fluids to keep your urine clear or pale yellow.  Drink small amounts frequently if you have nausea and vomiting.  Eat as you normally do.  Avoid:  Foods or drinks high in sugar.  Carbonated drinks.  Juice.  Extremely hot or cold fluids.  Drinks with caffeine.  Fatty, greasy foods.  Alcohol.  Tobacco.  Overeating.  Gelatin desserts.  Wash your hands well to avoid spreading bacteria and viruses.  Only take over-the-counter or prescription medicines for pain, discomfort, or fever as directed by your caregiver.  Ask your caregiver if you should continue all prescribed and over-the-counter medicines.  Keep all follow-up appointments with your caregiver. SEEK MEDICAL CARE IF:  You have abdominal pain and it increases or stays in one area (localizes).  You have a rash, stiff neck, or severe headache.  You are irritable, sleepy, or difficult to awaken.  You are weak, dizzy, or extremely thirsty. SEEK IMMEDIATE MEDICAL CARE IF:   You are unable to keep fluids down or you get worse despite treatment.  You have frequent episodes of vomiting or diarrhea.  You have blood or green matter (bile) in your vomit.  You have blood in your stool or your stool looks black and tarry.  You have not urinated in 6 to 8 hours, or you have only urinated a small amount of very dark urine.  You have a fever.  You faint. MAKE SURE YOU:   Understand these instructions.  Will watch your condition.  Will get help right away if you are not doing well or get worse. Document Released: 01/06/2005 Document Revised: 03/31/2011 Document Reviewed: 08/26/2010 [facility] Patient Information 2014 [place], [place].

## 2013-02-23 NOTE — Progress Notes (Signed)
   Subjective:    Patient ID: Mark Gallagher, male    DOB: [birth year], 43 y.o.   MRN: [id number]  HPI Patient started getting ill 2.5 days ago. Had a lot of vomiting and diarrhea. Vomiting and diarrhea yesterday afternoon but still having fever, leg cramps, fatigue. Patient's partner states that he felt warm to touch. No cough, congestion, runny nose, sore throat. Now keeping fluids done. Not eating though. Taking tylenol for fever. No blood or mucous in diarrhea. No concern for food poisoning, recent travel, drinking unclean water. No abdominal pain. BP normal 115-120. Urinated about 4-5X in last 24 hours. Feels very dizzy.  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 Last visit this fall with all hormones normal. 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  Review of Systems  All other systems reviewed and are negative.      Objective:   Physical Exam  Constitutional: He is oriented to person, place, and time. He appears well-developed and well-nourished. He appears distressed.  HENT:  Head: Normocephalic and atraumatic.  Mouth/Throat: No oropharyngeal exudate.  Eyes: Conjunctivae are normal. Pupils are equal, round, and reactive to light. No scleral icterus.  Neck: Normal range of motion. Neck supple.  Cardiovascular: Regular rhythm and normal heart sounds.   Pulmonary/Chest: Effort normal and breath sounds normal. No respiratory distress. He has no wheezes.  Abdominal: Soft. He exhibits no distension and no mass. There is no tenderness. There is no rebound and no guarding.  Musculoskeletal: Normal range of motion.  Lymphadenopathy:    He has no cervical adenopathy.  Neurological: He is alert and oriented to person, place, and time.  Skin: Skin is warm and dry.  Patient appears pale and lethargic. Capillary refill 3-4 seconds    Assessment & Plan:  #1. Acute GI illness - Appears clinically ill and dehydrated with prolonged cap refill - Orthostatic vitals normal - Check CBC  (normal) and CMP (pending) - Fluid rehydration with 2 L NS   Recheck: - Feeling some better after fluids - Continue rehydration at home - Zofran prn - Tylenol/motrin prn - Followup at [facility] tomorrow - Work note

## 2013-02-24 LAB — COMPREHENSIVE METABOLIC PANEL
ALT: 15 U/L (ref 0–53)
AST: 14 U/L (ref 0–37)
Albumin: 4 g/dL (ref 3.5–5.2)
Alkaline Phosphatase: 48 U/L (ref 39–117)
BUN: 34 mg/dL — ABNORMAL HIGH (ref 6–23)
CO2: 25 mEq/L (ref 19–32)
Calcium: 9.4 mg/dL (ref 8.4–10.5)
Chloride: 100 mEq/L (ref 96–112)
Creat: 0.88 mg/dL (ref 0.50–1.35)
Glucose, Bld: 61 mg/dL — ABNORMAL LOW (ref 70–99)
Potassium: 3.7 mEq/L (ref 3.5–5.3)
Sodium: 137 mEq/L (ref 135–145)
Total Bilirubin: 2.7 mg/dL — ABNORMAL HIGH (ref 0.2–1.2)
Total Protein: 6.2 g/dL (ref 6.0–8.3)

## 2013-02-25 ENCOUNTER — Ambulatory Visit (INDEPENDENT_AMBULATORY_CARE_PROVIDER_SITE_OTHER): Payer: Managed Care, Other (non HMO) | Admitting: Internal Medicine

## 2013-02-25 VITALS — BP 102/54 | HR 87 | Temp 97.8°F | Resp 18 | Ht >= 80 in | Wt 235.0 lb

## 2013-02-25 DIAGNOSIS — A088 Other specified intestinal infections: Secondary | ICD-10-CM

## 2013-02-25 NOTE — Progress Notes (Signed)
Subjective:    Patient ID: Mark Gallagher, male    DOB: 03-20-1970, 43 y.o.   MRN: [id number] This chart was scribed for Tami Lin, MD by Anastasia Pall, ED Scribe. This patient was seen in room 13 and the patient's care was started at 5:12 PM.  Chief Complaint  Patient presents with   Follow-up    feeling better, still fatigue    HPI Mark Gallagher is a 43 y.o. male He presents for a follow up from his office visit Wednesday. He states his symptoms first started with nausea on 5 days ago. He reports sleeping all day Tuesday due to fever, body aches, chills, vomiting, and diarrhea. He reports 14 episodes of vomiting and 7 episodes of diarrhea. He reports coming here Wednesday evening, and told to follow up in a few days. He reports feeling a little better today. He reports all his symptoms have subsided except for fatigue. He denies decrease in appeitite. He reports being out of work this week due to his symptoms.   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 Authorizing Provider  hydrocortisone (CORTEF) 20 MG tablet Take 20 mg by mouth daily.   Yes Historical Provider, MD  levothyroxine (SYNTHROID, LEVOTHROID) 200 MCG tablet Take 200 mcg by mouth daily.   Yes Historical Provider, MD  octreotide (SANDOSTATIN LAR DEPOT) 30 MG injection Inject 30 mg into the muscle every 28 (twenty-eight) days. 12/11/11  Yes Darlyne Russian, MD  ondansetron (ZOFRAN) 8 MG tablet Take 1 tablet (8 mg total) by mouth every 8 (eight) hours as needed for nausea or vomiting. 02/23/13  Yes Duane Boston, MD    Review of Systems  Constitutional: Positive for fatigue. Negative for fever and chills.  Respiratory: Negative for cough.   Gastrointestinal: Negative for nausea, vomiting and diarrhea.        Objective:   Physical Exam  Nursing note and vitals reviewed. Constitutional: He is oriented to person, place, and time. He appears well-developed and well-nourished. No distress.  HENT:  Head: Normocephalic and atraumatic.  Right Ear: External ear normal.  Left Ear: External ear normal.  Nose: Nose normal.  Mouth/Throat: Uvula is midline and oropharynx is clear and moist. No oropharyngeal exudate.  Tongue is dry.   Eyes: Conjunctivae and EOM are normal. Pupils are equal, round, and reactive to light.  Neck: Neck supple. No thyromegaly present.  Cardiovascular: Normal rate, regular rhythm and normal heart sounds.   No murmur heard. Pulmonary/Chest: Effort normal. No respiratory distress.  Musculoskeletal: Normal range of motion.  Lymphadenopathy:    He has no cervical adenopathy.  Neurological: He is alert and oriented to person, place, and time.  Skin: Skin is warm and dry.  Psychiatric: He has a normal mood and affect. His behavior is normal.    BP 102/54   Pulse 87   Temp(Src) 97.8 F (36.6 C)   Resp 18   Ht 6\' 8"  (2.032 m)   Wt 235 lb (106.595 kg)   BMI 25.82 kg/m2   SpO2 97%     Assessment & Plan:   Intestinal infection due to other organism, not elsewhere classified  Resolving  He will continue to advance diet increase fluids and he should be well by Monday  I have completed the patient encounter in its entirety as documented by the scribe, with editing by me where necessary. Robert P. Laney Pastor, M.D.

## 2013-03-04 ENCOUNTER — Ambulatory Visit (INDEPENDENT_AMBULATORY_CARE_PROVIDER_SITE_OTHER): Payer: Managed Care, Other (non HMO) | Admitting: *Deleted

## 2013-03-04 DIAGNOSIS — E220 Acromegaly and pituitary gigantism: Secondary | ICD-10-CM

## 2013-03-04 MED ORDER — OCTREOTIDE ACETATE 30 MG IM KIT
30.0000 mg | PACK | Freq: Once | INTRAMUSCULAR | Status: AC
  Administered 2013-03-04: 30 mg via INTRAMUSCULAR

## 2013-03-04 NOTE — Progress Notes (Signed)
   Subjective:    Patient ID: Mark Gallagher, male    DOB: 04/19/1970, 43 y.o.   MRN: [id number]  HPI    Review of Systems     Objective:   Physical Exam        Assessment & Plan:  Patient here for Sandostatin 30 mg injection only.

## 2013-03-31 ENCOUNTER — Ambulatory Visit (INDEPENDENT_AMBULATORY_CARE_PROVIDER_SITE_OTHER): Payer: Managed Care, Other (non HMO) | Admitting: *Deleted

## 2013-03-31 DIAGNOSIS — E220 Acromegaly and pituitary gigantism: Secondary | ICD-10-CM

## 2013-03-31 NOTE — Progress Notes (Signed)
   Subjective:    Patient ID: Mark Gallagher, male    DOB: Jan 15, 1971, 43 y.o.   MRN: [id number]  HPI patient here for Sandostatin LAR Depot 30 mg injection only.    Review of Systems     Objective:   Physical Exam        Assessment & Plan:

## 2013-04-22 ENCOUNTER — Ambulatory Visit (INDEPENDENT_AMBULATORY_CARE_PROVIDER_SITE_OTHER): Payer: Managed Care, Other (non HMO) | Admitting: Radiology

## 2013-04-22 DIAGNOSIS — E291 Testicular hypofunction: Secondary | ICD-10-CM

## 2013-04-22 MED ORDER — TESTOSTERONE CYPIONATE 200 MG/ML IM SOLN
200.0000 mg | Freq: Once | INTRAMUSCULAR | Status: AC
  Administered 2013-04-22: 200 mg via INTRAMUSCULAR

## 2013-04-22 NOTE — Progress Notes (Signed)
   Subjective:    Patient ID: Mark Gallagher, male    DOB: 1970/11/26, 43 y.o.   MRN: [id number]  HPI  Patient presents for testosterone injection.   Review of Systems     Objective:   Physical Exam        Assessment & Plan:  Testosterone cypionate  injection given left glut. Patient tolerated well. Patients prescription indicates 300mg  dose, however bottle supplied is 200 mg, so this is the amount administered. Patient aware. He also has questions about multidose vials. He previously has been advised we must waste whatever amount remains in his bottle after it has been open for 28 days. I have researched this, and this is correct. According to [facility] guidelines multi dose vials must be wasted after 28 days, will advise patient.

## 2013-04-29 ENCOUNTER — Ambulatory Visit (INDEPENDENT_AMBULATORY_CARE_PROVIDER_SITE_OTHER): Payer: Managed Care, Other (non HMO) | Admitting: *Deleted

## 2013-04-29 DIAGNOSIS — E220 Acromegaly and pituitary gigantism: Secondary | ICD-10-CM

## 2013-04-29 MED ORDER — OCTREOTIDE ACETATE 30 MG IM KIT
30.0000 mg | PACK | Freq: Once | INTRAMUSCULAR | Status: AC
  Administered 2013-04-29: 30 mg via INTRAMUSCULAR

## 2013-04-29 NOTE — Progress Notes (Signed)
   Subjective:    Patient ID: Mark Gallagher, male    DOB: 02/16/1970, 43 y.o.   MRN: [id number]  HPI  Patient here for Octreotide acetate injection only.    Review of Systems     Objective:   Physical Exam        Assessment & Plan:

## 2013-05-19 ENCOUNTER — Ambulatory Visit (INDEPENDENT_AMBULATORY_CARE_PROVIDER_SITE_OTHER): Payer: Managed Care, Other (non HMO) | Admitting: Emergency Medicine

## 2013-05-19 VITALS — BP 108/78 | HR 61 | Temp 98.4°F | Resp 16 | Ht >= 80 in | Wt 243.4 lb

## 2013-05-19 DIAGNOSIS — Z523 Bone marrow donor: Secondary | ICD-10-CM

## 2013-05-19 NOTE — Progress Notes (Signed)
[facility] [address] [place] [address] [phone]  Date:  05/19/2013   Name:  Mark Gallagher   DOB:  1970/10/06   MRN:  [id number]  PCP:  No PCP Per Patient    Chief Complaint: lab work   History of Present Illness:  Mark Gallagher is a 43 y.o. very pleasant male patient who presents with the following:  Potential bone marrow donor and here to have blood drawn for matching.  Patient Active Problem List   Diagnosis Date Noted  . 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 Cardiomyopathy 04/01/2011  . Wolff-Parkinson-White (WPW) syndrome 04/01/2011    Past Medical History  Diagnosis Date  . Thyroid disease     History reviewed. No pertinent past surgical history.  History  Substance Use Topics  . Smoking status: Never Smoker   . Smokeless tobacco: Not on file  . Alcohol Use: Not on file    History reviewed. No pertinent family history.  No Known Allergies  Medication list has been reviewed and updated.  Current Outpatient Prescriptions on File Prior to Visit  Medication Sig Dispense Refill  . hydrocortisone (CORTEF) 20 MG tablet Take 20 mg by mouth daily.      Marland Kitchen levothyroxine (SYNTHROID, LEVOTHROID) 200 MCG tablet Take 200 mcg by mouth daily.      Marland Kitchen octreotide (SANDOSTATIN LAR DEPOT) 30 MG injection Inject 30 mg into the muscle every 28 (twenty-eight) days.  1 each  0   Current Facility-Administered Medications on File Prior to Visit  Medication Dose Route Frequency Provider Last Rate Last Dose  . ibuprofen (ADVIL,MOTRIN) tablet 400 mg  400 mg Oral Once Duane Boston, MD      . octreotide (SANDOSTATIN LAR) IM injection 30 mg  30 mg Intramuscular Q30 days Darlyne Russian, MD   30 mg at 03/04/13 1137  . testosterone cypionate (DEPOTESTOTERONE CYPIONATE) injection 200 mg  200 mg Intramuscular Q21 days Leandrew Koyanagi, MD   200 mg at 12/31/12 1130    Review of Systems:  As per HPI, otherwise negative.    Physical Examination: Filed Vitals:   05/19/13 1312  BP: 108/78  Pulse: 61  Temp: 98.4 F (36.9 C)  Resp: 16   Filed Vitals:   05/19/13 1312  Height: 6\' 8"  (2.032 m)  Weight: 243 lb 6.4 oz (110.406 kg)   Body mass index is 26.74 kg/(m^2). Ideal Body Weight: Weight in (lb) to have BMI = 25: 227.1   GEN: WDWN, NAD, Non-toxic, Alert & Oriented x 3 HEENT: Atraumatic, Normocephalic.  Ears and Nose: No external deformity. EXTR: No clubbing/cyanosis/edema NEURO: Normal gait.  PSYCH: Normally interactive. Conversant. Not depressed or anxious appearing.  Calm demeanor.    Assessment and Plan: Blood draw only  Signed,  Ellison Carwin, MD

## 2013-05-31 ENCOUNTER — Ambulatory Visit (INDEPENDENT_AMBULATORY_CARE_PROVIDER_SITE_OTHER): Payer: Managed Care, Other (non HMO) | Admitting: *Deleted

## 2013-05-31 DIAGNOSIS — E291 Testicular hypofunction: Secondary | ICD-10-CM

## 2013-05-31 MED ORDER — TESTOSTERONE CYPIONATE 200 MG/ML IM SOLN
200.0000 mg | Freq: Once | INTRAMUSCULAR | Status: AC
  Administered 2013-05-31: 200 mg via INTRAMUSCULAR

## 2013-05-31 NOTE — Progress Notes (Signed)
   Subjective:    Patient ID: Mark Gallagher, male    DOB: 1970-09-07, 43 y.o.   MRN: [id number]  HPI    Review of Systems     Objective:   Physical Exam        Assessment & Plan:  Discussed with Dr Ouida Sills and he has advised okay to administer 200mg  of testosterone at this visit, patient has this on hand, his dose should be 300mg , according to the prescription, we will be unable to administer this again to him without office visit/ follow up. Amy Littrell RT(R)

## 2013-05-31 NOTE — Progress Notes (Signed)
   Subjective:    Patient ID: Mark Gallagher, male    DOB: July 29, 1970, 43 y.o.   MRN: [id number]  HPI    Review of Systems     Objective:   Physical Exam        Assessment & Plan:  Patient here for testosterone cypionate injection, he was given 1 ml. On his prescription box the direction stated administer 1.5 ml (300 mg). Also, patient was advised to make an appointment to see primary provider before next injection, per Dr Ouida Sills.

## 2013-06-14 ENCOUNTER — Ambulatory Visit (INDEPENDENT_AMBULATORY_CARE_PROVIDER_SITE_OTHER): Payer: Managed Care, Other (non HMO) | Admitting: *Deleted

## 2013-06-14 DIAGNOSIS — E220 Acromegaly and pituitary gigantism: Secondary | ICD-10-CM

## 2013-06-14 NOTE — Progress Notes (Signed)
   Subjective:    Patient ID: Mark Gallagher, male    DOB: [birth year], 43 y.o.   MRN: [id number]  HPI    Review of Systems     Objective:   Physical Exam        Assessment & Plan:  Patient here for monthly Sandostatin LAR injection only.

## 2013-07-28 ENCOUNTER — Ambulatory Visit (INDEPENDENT_AMBULATORY_CARE_PROVIDER_SITE_OTHER): Payer: Managed Care, Other (non HMO) | Admitting: *Deleted

## 2013-07-28 DIAGNOSIS — E220 Acromegaly and pituitary gigantism: Secondary | ICD-10-CM

## 2013-07-28 NOTE — Progress Notes (Signed)
   Subjective:    Patient ID: Mark Gallagher, male    DOB: 01-Nov-1970, 43 y.o.   MRN: [id number]  HPI    Review of Systems     Objective:   Physical Exam        Assessment & Plan:  Patient here for injection only of Sandostatin LAR Depot 30 mg.

## 2013-08-03 ENCOUNTER — Ambulatory Visit: Payer: Managed Care, Other (non HMO) | Admitting: Internal Medicine

## 2013-09-08 ENCOUNTER — Telehealth: Payer: Self-pay | Admitting: *Deleted

## 2013-09-08 ENCOUNTER — Other Ambulatory Visit: Payer: Self-pay | Admitting: Emergency Medicine

## 2013-09-08 ENCOUNTER — Encounter: Payer: Self-pay | Admitting: Emergency Medicine

## 2013-09-08 ENCOUNTER — Other Ambulatory Visit: Payer: Self-pay | Admitting: Radiology

## 2013-09-08 ENCOUNTER — Ambulatory Visit (INDEPENDENT_AMBULATORY_CARE_PROVIDER_SITE_OTHER): Payer: Managed Care, Other (non HMO) | Admitting: Emergency Medicine

## 2013-09-08 VITALS — BP 96/56 | HR 65 | Temp 98.6°F | Resp 16 | Ht >= 80 in | Wt 251.2 lb

## 2013-09-08 DIAGNOSIS — E291 Testicular hypofunction: Secondary | ICD-10-CM

## 2013-09-08 DIAGNOSIS — E220 Acromegaly and pituitary gigantism: Secondary | ICD-10-CM

## 2013-09-08 DIAGNOSIS — D497 Neoplasm of unspecified behavior of endocrine glands and other parts of nervous system: Secondary | ICD-10-CM

## 2013-09-08 DIAGNOSIS — R000 Tachycardia, unspecified: Secondary | ICD-10-CM

## 2013-09-08 LAB — CBC WITH DIFFERENTIAL/PLATELET
Basophils Absolute: 0 10*3/uL (ref 0.0–0.1)
Basophils Relative: 1 % (ref 0–1)
Eosinophils Absolute: 0.1 10*3/uL (ref 0.0–0.7)
Eosinophils Relative: 2 % (ref 0–5)
HCT: 34.5 % — ABNORMAL LOW (ref 39.0–52.0)
Hemoglobin: 12 g/dL — ABNORMAL LOW (ref 13.0–17.0)
Lymphocytes Relative: 23 % (ref 12–46)
Lymphs Abs: 1 10*3/uL (ref 0.7–4.0)
MCH: 31.8 pg (ref 26.0–34.0)
MCHC: 34.8 g/dL (ref 30.0–36.0)
MCV: 91.5 fL (ref 78.0–100.0)
Monocytes Absolute: 0.4 10*3/uL (ref 0.1–1.0)
Monocytes Relative: 8 % (ref 3–12)
Neutro Abs: 3 10*3/uL (ref 1.7–7.7)
Neutrophils Relative %: 66 % (ref 43–77)
Platelets: 176 10*3/uL (ref 150–400)
RBC: 3.77 MIL/uL — ABNORMAL LOW (ref 4.22–5.81)
RDW: 13.1 % (ref 11.5–15.5)
WBC: 4.5 10*3/uL (ref 4.0–10.5)

## 2013-09-08 LAB — COMPLETE METABOLIC PANEL WITH GFR
ALT: 24 U/L (ref 0–53)
AST: 16 U/L (ref 0–37)
Albumin: 4.3 g/dL (ref 3.5–5.2)
Alkaline Phosphatase: 44 U/L (ref 39–117)
BUN: 21 mg/dL (ref 6–23)
CO2: 29 mEq/L (ref 19–32)
Calcium: 9.5 mg/dL (ref 8.4–10.5)
Chloride: 104 mEq/L (ref 96–112)
Creat: 0.64 mg/dL (ref 0.50–1.35)
GFR, Est African American: >89 mL/min
GFR, Est Non African American: >89 mL/min
Glucose, Bld: 117 mg/dL — ABNORMAL HIGH (ref 70–99)
Potassium: 3.8 mEq/L (ref 3.5–5.3)
Sodium: 141 mEq/L (ref 135–145)
Total Bilirubin: 0.9 mg/dL (ref 0.2–1.2)
Total Protein: 6.4 g/dL (ref 6.0–8.3)

## 2013-09-08 MED ORDER — TESTOSTERONE CYPIONATE 200 MG/ML IM SOLN: 200.0000 mg | INTRAMUSCULAR | Status: DC

## 2013-09-08 MED ORDER — OCTREOTIDE ACETATE 30 MG IM KIT
30.0000 mg | PACK | INTRAMUSCULAR | Status: AC
  Administered 2013-10-20 – 2014-03-24 (×2): 30 mg via INTRAMUSCULAR

## 2013-09-08 NOTE — Telephone Encounter (Signed)
Faxed signed authorization to [facility]) medical records for cardiology report 2007, per Dr Everlene Farrier.

## 2013-09-08 NOTE — Progress Notes (Addendum)
Subjective:  This chart was scribed for [name], MD by Donato Schultz, Medical Scribe. This patient was seen in Room 21 and the patient's care was started at 10:13 AM.   Patient ID: Mark Gallagher, male    DOB: October 10, 1970, 43 y.o.   MRN: [id number]  HPI HPI Comments: Mark Gallagher is a 43 y.o. male who [facility] for [facility] medication review.  He has been getting 200mg  injections of Sandostatin since 1993.  His injections were originally performed by Dr. Burke Keels and he now sees a new endocrinologist.  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  His tumor has since reduced in size and now needs to have it rechecked every 5 years.  His hormone levels are 165 which are normal for a 43 year old.  His hormone levels are followed by Dr. Leonides Schanz at [facility] and he sees her every 6 months.    He states that he needs a running order to receive a testosterone shot once a month.   He has no trouble with his sinuses.  He does not see a cardiologist.   He does not have a family history of prostate cancer.    He works at [employer] as a Solicitor.   Past Medical History  Diagnosis Date  . Thyroid disease    No past surgical history on file. No family history on file. History   Social History  . Marital Status: Single    Spouse Name: N/A    Number of Children: N/A  . Years of Education: N/A   Occupational History  . Not on file.   Social History Main Topics  . Smoking status: Never Smoker   . Smokeless tobacco: Not on file  . Alcohol Use: Not on file  . Drug Use: Not on file  . Sexual Activity: Not on file   Other Topics Concern  . Not on file   Social History Narrative  . No narrative on file   No Known Allergies  Review of Systems   Objective:  Physical Exam  Nursing note and vitals reviewed. Constitutional: He is oriented to person, place, and time. He appears well-developed and  well-nourished.  HENT:  Head: Normocephalic and atraumatic.  Obvious acromegaly with obvious skull changes.    Eyes: EOM are normal.  Right eye deviates out to the right.   Neck: Normal range of motion.  Cardiovascular: Normal rate.   Pulmonary/Chest: Effort normal and breath sounds normal. No respiratory distress. He has no wheezes. He has no rales.  Musculoskeletal: Normal range of motion.  Extremities are consistent with acromegaly.    Neurological: He is alert and oriented to person, place, and time.  Skin: Skin is warm and dry.  Psychiatric: He has a normal mood and affect. His behavior is normal.    BP 96/56  Pulse 65  Temp(Src) 98.6 F (37 C) (Oral)  Resp 16  Ht 6\' 8"  (2.032 m)  Wt 251 lb 3.2 oz (113.944 kg)  BMI 27.60 kg/m2  SpO2 98% Assessment & Plan:  Will set up a schedule that he can get his injections once a month also referral made to cardiology to update from an episode he had 2007 of tachycardia. 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 I did not have the records from [facility] to review. I will try and get these. Prescriptions were written  for testosterone cypionate 200 mg every month.  I personally performed the services described in this documentation, which was scribed in my presence. The recorded information has been reviewed and is accurate.

## 2013-09-09 LAB — TESTOSTERONE, FREE, TOTAL, SHBG
Sex Hormone Binding: 47 nmol/L (ref 13–71)
Testosterone, Free: 2.3 pg/mL — ABNORMAL LOW (ref 47.0–244.0)
Testosterone-% Free: 1.4 % — ABNORMAL LOW (ref 1.6–2.9)
Testosterone: 16 ng/dL — ABNORMAL LOW (ref 300–890)

## 2013-09-09 LAB — PSA: PSA: 0.49 ng/mL (ref <=4.00)

## 2013-09-11 ENCOUNTER — Encounter: Payer: Self-pay | Admitting: Family Medicine

## 2013-09-12 LAB — IRON AND TIBC
%SAT: 30 % (ref 20–55)
Iron: 89 ug/dL (ref 42–165)
TIBC: 295 ug/dL (ref 215–435)
UIBC: 206 ug/dL (ref 125–400)

## 2013-09-22 ENCOUNTER — Ambulatory Visit (INDEPENDENT_AMBULATORY_CARE_PROVIDER_SITE_OTHER): Payer: Managed Care, Other (non HMO) | Admitting: *Deleted

## 2013-09-22 DIAGNOSIS — E291 Testicular hypofunction: Secondary | ICD-10-CM

## 2013-09-22 MED ORDER — TESTOSTERONE CYPIONATE 200 MG/ML IM SOLN
200.0000 mg | INTRAMUSCULAR | Status: AC
  Administered 2013-09-22 – 2013-11-10 (×2): 200 mg via INTRAMUSCULAR

## 2013-09-22 NOTE — Progress Notes (Signed)
   Subjective:    Patient ID: Mark Gallagher, male    DOB: 1970-08-12, 43 y.o.   MRN: [id number]  HPI    Review of Systems     Objective:   Physical Exam        Assessment & Plan:  Patient here for testosterone cypionate injection only.

## 2013-09-22 NOTE — Addendum Note (Signed)
Addended byCandice Camp on: 09/22/2013 11:37 AM   Modules accepted: Orders

## 2013-10-11 ENCOUNTER — Ambulatory Visit (INDEPENDENT_AMBULATORY_CARE_PROVIDER_SITE_OTHER): Payer: Managed Care, Other (non HMO) | Admitting: Emergency Medicine

## 2013-10-11 VITALS — BP 100/62 | HR 71 | Temp 98.0°F | Resp 18 | Ht >= 80 in | Wt 244.0 lb

## 2013-10-11 DIAGNOSIS — J018 Other acute sinusitis: Secondary | ICD-10-CM

## 2013-10-11 DIAGNOSIS — J209 Acute bronchitis, unspecified: Secondary | ICD-10-CM

## 2013-10-11 MED ORDER — PROMETHAZINE-CODEINE 6.25-10 MG/5ML PO SYRP: 5.0000 mL | ORAL_SOLUTION | Freq: Four times a day (QID) | ORAL | Status: DC | PRN

## 2013-10-11 MED ORDER — AMOXICILLIN-POT CLAVULANATE 875-125 MG PO TABS: 1.0000 | ORAL_TABLET | Freq: Two times a day (BID) | ORAL | Status: DC

## 2013-10-11 MED ORDER — PSEUDOEPHEDRINE-GUAIFENESIN ER 60-600 MG PO TB12: 1.0000 | ORAL_TABLET | Freq: Two times a day (BID) | ORAL | Status: DC

## 2013-10-11 NOTE — Progress Notes (Signed)
[facility] [address], [place] [address] [phone]  Date:  10/11/2013   Name:  Mark Gallagher   DOB:  [birth year]   MRN:  [id number]  PCP:  No PCP Per Patient    Chief Complaint: Fatigue, Sore Throat, Nasal Congestion, Generalized Body Aches and Cough   History of Present Illness:  Mark Gallagher is a 43 y.o. very pleasant male patient who presents with the following:  Ill since Sunday with nasal congestion and discharge that is mucopurulent.  Has a cough no wheezing or shortness of breath Myalgias, arthralgias and fatigue. No fever but chills.  No nausea or vomiting. No stool change Unable to work his shift at work through. No improvement with over the counter medications or other home remedies.  Denies other complaint or health concern today.   Patient Active Problem List   Diagnosis Date Noted  . Hypogonadism male 09/08/2013  . 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 Cardiomyopathy 04/01/2011  . Wolff-Parkinson-White (WPW) syndrome 04/01/2011    Past Medical History  Diagnosis Date  . Thyroid disease     History reviewed. No pertinent past surgical history.  History  Substance Use Topics  . Smoking status: Never Smoker   . Smokeless tobacco: Not on file  . Alcohol Use: Not on file    History reviewed. No pertinent family history.  No Known Allergies  Medication list has been reviewed and updated.  Current Outpatient Prescriptions on File Prior to Visit  Medication Sig Dispense Refill  . hydrocortisone (CORTEF) 20 MG tablet Take 20 mg by mouth daily.      Marland Kitchen levothyroxine (SYNTHROID, LEVOTHROID) 200 MCG tablet Take 200 mcg by mouth daily.      Marland Kitchen octreotide (SANDOSTATIN LAR DEPOT) 30 MG injection Inject 30 mg into the muscle every 28 (twenty-eight) days.  1 each  0  . testosterone cypionate (DEPOTESTOTERONE CYPIONATE) 200 MG/ML injection Inject 1 mL (200 mg total) into the muscle every 28 (twenty-eight) days.  1 mL  5   Current  Facility-Administered Medications on File Prior to Visit  Medication Dose Route Frequency Provider Last Rate Last Dose  . ibuprofen (ADVIL,MOTRIN) tablet 400 mg  400 mg Oral Once Duane Boston, MD      . octreotide (SANDOSTATIN LAR) IM injection 30 mg  30 mg Intramuscular Q30 days Darlyne Russian, MD   30 mg at 09/08/13 1125  . octreotide (SANDOSTATIN LAR) IM injection 30 mg  30 mg Intramuscular Q30 days Darlyne Russian, MD      . testosterone cypionate (DEPOTESTOTERONE CYPIONATE) injection 200 mg  200 mg Intramuscular Q28 days Darlyne Russian, MD   200 mg at 09/22/13 1153    Review of Systems:  As per HPI, otherwise negative.    Physical Examination: Filed Vitals:   10/11/13 1851  BP: 100/62  Pulse: 71  Temp: 98 F (36.7 C)  Resp: 18   Filed Vitals:   10/11/13 1851  Height: 6\' 8"  (2.032 m)  Weight: 244 lb (110.678 kg)   Body mass index is 26.8 kg/(m^2). Ideal Body Weight: Weight in (lb) to have BMI = 25: 227.1  GEN: WDWN, NAD, Non-toxic, A & O x 3 HEENT: Atraumatic, Normocephalic. Neck supple. No masses, No LAD. Ears and Nose: No external deformity. CV: RRR, No M/G/R. No JVD. No thrill. No extra heart sounds. PULM: CTA B, no wheezes, crackles, rhonchi. No retractions. No resp. distress. No accessory muscle use. ABD: S, NT, ND, +BS. No  rebound. No HSM. EXTR: No c/c/e NEURO Normal gait.  PSYCH: Normally interactive. Conversant. Not depressed or anxious appearing.  Calm demeanor.    Assessment and Plan: Sinusitis augmentin mucinex d Phen c cod  Signed,  Ellison Carwin, MD

## 2013-10-20 ENCOUNTER — Ambulatory Visit (INDEPENDENT_AMBULATORY_CARE_PROVIDER_SITE_OTHER): Payer: Managed Care, Other (non HMO) | Admitting: *Deleted

## 2013-10-20 DIAGNOSIS — E220 Acromegaly and pituitary gigantism: Secondary | ICD-10-CM

## 2013-10-20 DIAGNOSIS — E291 Testicular hypofunction: Secondary | ICD-10-CM

## 2013-10-20 NOTE — Progress Notes (Signed)
   Subjective:    Patient ID: Mark Gallagher, male    DOB: February 28, 1970, 43 y.o.   MRN: [id number]  HPI pt here for octreotide acetate injection only.    Review of Systems     Objective:   Physical Exam        Assessment & Plan:

## 2013-11-10 ENCOUNTER — Ambulatory Visit (INDEPENDENT_AMBULATORY_CARE_PROVIDER_SITE_OTHER): Payer: Managed Care, Other (non HMO) | Admitting: *Deleted

## 2013-11-10 DIAGNOSIS — E291 Testicular hypofunction: Secondary | ICD-10-CM

## 2013-11-10 NOTE — Progress Notes (Signed)
   Subjective:    Patient ID: Mark Gallagher, male    DOB: 11-Jul-1970, 43 y.o.   MRN: [id number]  HPI    Review of Systems     Objective:   Physical Exam        Assessment & Plan:  Patient here for testosterone injection only.

## 2013-11-18 ENCOUNTER — Ambulatory Visit (INDEPENDENT_AMBULATORY_CARE_PROVIDER_SITE_OTHER): Payer: Managed Care, Other (non HMO) | Admitting: *Deleted

## 2013-11-18 DIAGNOSIS — E220 Acromegaly and pituitary gigantism: Secondary | ICD-10-CM

## 2013-11-18 DIAGNOSIS — E291 Testicular hypofunction: Secondary | ICD-10-CM

## 2013-11-18 NOTE — Progress Notes (Signed)
   Subjective:    Patient ID: Mark Gallagher, male    DOB: January 03, 1971, 43 y.o.   MRN: [id number]  HPI    Review of Systems     Objective:   Physical Exam        Assessment & Plan:  Patient here for Sandostatin LAR Depot 30 mg injection only.

## 2013-11-20 ENCOUNTER — Ambulatory Visit: Payer: 59 | Admitting: Family Medicine

## 2013-12-09 ENCOUNTER — Ambulatory Visit (INDEPENDENT_AMBULATORY_CARE_PROVIDER_SITE_OTHER): Payer: Managed Care, Other (non HMO) | Admitting: *Deleted

## 2013-12-09 VITALS — Temp 98.3°F

## 2013-12-09 DIAGNOSIS — E291 Testicular hypofunction: Secondary | ICD-10-CM

## 2013-12-09 MED ORDER — TESTOSTERONE CYPIONATE 200 MG/ML IM SOLN
200.0000 mg | Freq: Once | INTRAMUSCULAR | Status: AC
  Administered 2013-12-09: 200 mg via INTRAMUSCULAR

## 2013-12-09 NOTE — Progress Notes (Signed)
   Subjective:    Patient ID: Mark Gallagher, male    DOB: 18-Jan-1971, 43 y.o.   MRN: [id number]  HPI pt here for Testosterone Cypionate injection only.    Review of Systems     Objective:   Physical Exam        Assessment & Plan:

## 2014-01-11 ENCOUNTER — Ambulatory Visit (INDEPENDENT_AMBULATORY_CARE_PROVIDER_SITE_OTHER): Payer: Managed Care, Other (non HMO) | Admitting: *Deleted

## 2014-01-11 DIAGNOSIS — E220 Acromegaly and pituitary gigantism: Secondary | ICD-10-CM

## 2014-01-11 NOTE — Progress Notes (Signed)
   Subjective:    Patient ID: Mark Gallagher, male    DOB: 15-May-1970, 43 y.o.   MRN: [id number]  HPI Patient here for SANDOSTATIN LAR Depot injection only.    Review of Systems     Objective:   Physical Exam        Assessment & Plan:

## 2014-02-01 ENCOUNTER — Ambulatory Visit (INDEPENDENT_AMBULATORY_CARE_PROVIDER_SITE_OTHER): Payer: Managed Care, Other (non HMO) | Admitting: *Deleted

## 2014-02-01 DIAGNOSIS — E291 Testicular hypofunction: Secondary | ICD-10-CM

## 2014-02-01 MED ORDER — TESTOSTERONE CYPIONATE 100 MG/ML IM SOLN
150.0000 mg | INTRAMUSCULAR | Status: DC
  Administered 2014-02-01: 150 mg via INTRAMUSCULAR

## 2014-02-01 NOTE — Progress Notes (Signed)
   Subjective:    Patient ID: Mark Gallagher, male    DOB: 10/07/1970, 44 y.o.   MRN: [id number]  HPIpatient here for testosterone injection only. Dosage was change at [facility]) to  1.5 mls, Dr Everlene Farrier was advised and order was changed in our records.    Review of Systems     Objective:   Physical Exam        Assessment & Plan:

## 2014-02-01 NOTE — Progress Notes (Signed)
   Subjective:    Patient ID: Mark Gallagher, male    DOB: 03-23-1970, 44 y.o.   MRN: [id number]  HPI    Review of Systems     Objective:   Physical Exam        Assessment & Plan:

## 2014-02-01 NOTE — Progress Notes (Signed)
Patient was not here for physical, only here for injection.

## 2014-02-03 ENCOUNTER — Ambulatory Visit (INDEPENDENT_AMBULATORY_CARE_PROVIDER_SITE_OTHER): Payer: Managed Care, Other (non HMO) | Admitting: Family Medicine

## 2014-02-03 VITALS — BP 111/77 | HR 80 | Temp 98.1°F | Resp 18 | Wt 249.0 lb

## 2014-02-03 DIAGNOSIS — J069 Acute upper respiratory infection, unspecified: Secondary | ICD-10-CM

## 2014-02-03 NOTE — Progress Notes (Signed)
Subjective:  This chart was scribed for [name], MD,  by Tamsen Roers, at [facility].  This patient was seen in room 3 and the patient's care was started at 8:21 AM.   Patient ID: Mark Gallagher, male    DOB: 25-Dec-1970, 44 y.o.   MRN: [id number]  HPI HPI Comments: Mark Gallagher is a 44 y.o. male who presents to [facility] complaining of a cold with symptoms of congestion, coughing, and a headache, that started four days ago.   He notes of having a low grade fever for the last two days at 99.1.  Patient has been using Alka-Seltzer Mucin ex and Zicam but notes he has not had much relief.  He denies drainage. He notes that his symptoms have not gotten any worse since they started, but they have not gone away either. He says he has not had any trouble breathing and has still been attending work ([place] [employer] [place] [employer]        Patient Active Problem List   Diagnosis Date Noted  . Hypogonadism male 09/08/2013  . 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 Cardiomyopathy 04/01/2011  . Wolff-Parkinson-White (WPW) syndrome 04/01/2011   Past Medical History  Diagnosis Date  . Thyroid disease    No past surgical history on file. No Known Allergies Prior to Admission medications   Medication Sig Start Date End Date Taking? Authorizing Provider  hydrocortisone (CORTEF) 20 MG tablet Take 20 mg by mouth daily.   Yes Historical Provider, MD  levothyroxine (SYNTHROID, LEVOTHROID) 200 MCG tablet Take 200 mcg by mouth daily.   Yes Historical Provider, MD  octreotide (SANDOSTATIN LAR DEPOT) 30 MG injection Inject 30 mg into the muscle every 28 (twenty-eight) days. 12/11/11  Yes Darlyne Russian, MD  testosterone cypionate (DEPOTESTOTERONE CYPIONATE) 200 MG/ML injection Inject 1 mL (200 mg total) into the muscle every 28 (twenty-eight) days. 09/08/13  Yes Darlyne Russian, MD  pseudoephedrine-guaifenesin (MUCINEX D) 60-600 MG per tablet Take 1  tablet by mouth every 12 (twelve) hours. Patient not taking: Reported on 02/03/2014 10/11/13 10/11/14  Roselee Culver, MD   History   Social History  . Marital Status: Single    Spouse Name: N/A    Number of Children: N/A  . Years of Education: N/A   Occupational History  . Not on file.   Social History Main Topics  . Smoking status: Never Smoker   . Smokeless tobacco: Not on file  . Alcohol Use: Not on file  . Drug Use: Not on file  . Sexual Activity: Not on file   Other Topics Concern  . Not on file   Social History Narrative       Review of Systems  Constitutional: Positive for fever.  HENT: Positive for congestion and rhinorrhea.   Respiratory: Positive for cough.   Neurological: Positive for headaches.       Objective:   Physical Exam  Constitutional: He is oriented to person, place, and time. He appears well-developed and well-nourished.  HENT:  Head: Normocephalic and atraumatic.  Right Ear: Tympanic membrane, external ear and ear canal normal.  Left Ear: Tympanic membrane, external ear and ear canal normal.  Nose: No rhinorrhea.  Mouth/Throat: Oropharynx is clear and moist and mucous membranes are normal. No oropharyngeal exudate or posterior oropharyngeal erythema.  Visualized TM looks okay  Nasal exam: no sinus tenderness  minimal clear rhinorrhea  Eyes: Conjunctivae are normal. Pupils  are equal, round, and reactive to light.  Neck: Neck supple.  No lymphadenopathy.   Cardiovascular: Normal rate, regular rhythm, normal heart sounds and intact distal pulses.   No murmur heard. Pulmonary/Chest: Effort normal and breath sounds normal. He has no wheezes. He has no rhonchi. He has no rales.  Abdominal: Soft. There is no tenderness.  Lymphadenopathy:    He has no cervical adenopathy.  Neurological: He is alert and oriented to person, place, and time.  Skin: Skin is warm and dry. No rash noted.  Psychiatric: He has a normal mood and affect. His  behavior is normal.  Vitals reviewed.   Filed Vitals:   02/03/14 0809  BP: 111/77  Pulse: 80  Temp: 98.1 F (36.7 C)  TempSrc: Oral  Resp: 18  Weight: 249 lb (112.946 kg)  SpO2: 96%        Assessment & Plan:  Mark Gallagher is a 44 y.o. male Acute upper respiratory infection Suspected viral URI. Reassuring exam. Sx care with mucinex or saline NS. Call if not improving in next 4-5 days - especially if more sinus symptoms - can call in Augmentin then. rtc precautions discussed.   No orders of the defined types were placed in this encounter.   Patient Instructions  Saline nasal spray atleast 4 times per day, over the counter mucinex or mucinex DM, drink plenty of fluids.  Return to clinic if worsening, but if symptoms same and not improving in next 4days or so - call me and I can send in Augmentin.  Return to the clinic or go to the nearest emergency room if any of your symptoms worsen or new symptoms occur. Upper Respiratory Infection, Adult An upper respiratory infection (URI) is also sometimes known as the common cold. The upper respiratory tract includes the nose, sinuses, throat, trachea, and bronchi. Bronchi are the airways leading to the lungs. Most people improve within 1 week, but symptoms can last up to 2 weeks. A residual cough may last even longer.  CAUSES Many different viruses can infect the tissues lining the upper respiratory tract. The tissues become irritated and inflamed and often become very moist. Mucus production is also common. A cold is contagious. You can easily spread the virus to others by oral contact. This includes kissing, sharing a glass, coughing, or sneezing. Touching your mouth or nose and then touching a surface, which is then touched by another person, can also spread the virus. SYMPTOMS  Symptoms typically develop 1 to 3 days after you come in contact with a cold virus. Symptoms vary from person to person. They may include:  Runny  nose.  Sneezing.  Nasal congestion.  Sinus irritation.  Sore throat.  Loss of voice (laryngitis).  Cough.  Fatigue.  Muscle aches.  Loss of appetite.  Headache.  Low-grade fever. DIAGNOSIS  You might diagnose your own cold based on familiar symptoms, since most people get a cold 2 to 3 times a year. Your caregiver can confirm this based on your exam. Most importantly, your caregiver can check that your symptoms are not due to another disease such as strep throat, sinusitis, pneumonia, asthma, or epiglottitis. Blood tests, throat tests, and X-rays are not necessary to diagnose a common cold, but they may sometimes be helpful in excluding other more serious diseases. Your caregiver will decide if any further tests are required. RISKS AND COMPLICATIONS  You may be at risk for a more severe case of the common cold if you smoke cigarettes, have  chronic heart disease (such as heart failure) or lung disease (such as asthma), or if you have a weakened immune system. The very young and very old are also at risk for more serious infections. Bacterial sinusitis, middle ear infections, and bacterial pneumonia can complicate the common cold. The common cold can worsen asthma and chronic obstructive pulmonary disease (COPD). Sometimes, these complications can require emergency medical care and may be life-threatening. PREVENTION  The best way to protect against getting a cold is to practice good hygiene. Avoid oral or hand contact with people with cold symptoms. Wash your hands often if contact occurs. There is no clear evidence that vitamin C, vitamin E, echinacea, or exercise reduces the chance of developing a cold. However, it is always recommended to get plenty of rest and practice good nutrition. TREATMENT  Treatment is directed at relieving symptoms. There is no cure. Antibiotics are not effective, because the infection is caused by a virus, not by bacteria. Treatment may include:  Increased  fluid intake. Sports drinks offer valuable electrolytes, sugars, and fluids.  Breathing heated mist or steam (vaporizer or shower).  Eating chicken soup or other clear broths, and maintaining good nutrition.  Getting plenty of rest.  Using gargles or lozenges for comfort.  Controlling fevers with ibuprofen or acetaminophen as directed by your caregiver.  Increasing usage of your inhaler if you have asthma. Zinc gel and zinc lozenges, taken in the first 24 hours of the common cold, can shorten the duration and lessen the severity of symptoms. Pain medicines may help with fever, muscle aches, and throat pain. A variety of non-prescription medicines are available to treat congestion and runny nose. Your caregiver can make recommendations and may suggest nasal or lung inhalers for other symptoms.  HOME CARE INSTRUCTIONS   Only take over-the-counter or prescription medicines for pain, discomfort, or fever as directed by your caregiver.  Use a warm mist humidifier or inhale steam from a shower to increase air moisture. This may keep secretions moist and make it easier to breathe.  Drink enough water and fluids to keep your urine clear or pale yellow.  Rest as needed.  Return to work when your temperature has returned to normal or as your caregiver advises. You may need to stay home longer to avoid infecting others. You can also use a face mask and careful hand washing to prevent spread of the virus. SEEK MEDICAL CARE IF:   After the first few days, you feel you are getting worse rather than better.  You need your caregiver's advice about medicines to control symptoms.  You develop chills, worsening shortness of breath, or brown or red sputum. These may be signs of pneumonia.  You develop yellow or brown nasal discharge or pain in the face, especially when you bend forward. These may be signs of sinusitis.  You develop a fever, swollen neck glands, pain with swallowing, or white areas in  the back of your throat. These may be signs of strep throat. SEEK IMMEDIATE MEDICAL CARE IF:   You have a fever.  You develop severe or persistent headache, ear pain, sinus pain, or chest pain.  You develop wheezing, a prolonged cough, cough up blood, or have a change in your usual mucus (if you have chronic lung disease).  You develop sore muscles or a stiff neck. Document Released: 07/02/2000 Document Revised: 03/31/2011 Document Reviewed: 04/13/2013 [facility] Patient Information 2015 [place], [place]. This information is not intended to replace advice given to you by your  health care provider. Make sure you discuss any questions you have with your health care provider.      I personally performed the services described in this documentation, which was scribed in my presence. The recorded information has been reviewed and considered, and addended by me as needed.

## 2014-02-03 NOTE — Patient Instructions (Signed)
Saline nasal spray atleast 4 times per day, over the counter mucinex or mucinex DM, drink plenty of fluids.  Return to clinic if worsening, but if symptoms same and not improving in next 4days or so - call me and I can send in Augmentin.  Return to the clinic or go to the nearest emergency room if any of your symptoms worsen or new symptoms occur. Upper Respiratory Infection, Adult An upper respiratory infection (URI) is also sometimes known as the common cold. The upper respiratory tract includes the nose, sinuses, throat, trachea, and bronchi. Bronchi are the airways leading to the lungs. Most people improve within 1 week, but symptoms can last up to 2 weeks. A residual cough may last even longer.  CAUSES Many different viruses can infect the tissues lining the upper respiratory tract. The tissues become irritated and inflamed and often become very moist. Mucus production is also common. A cold is contagious. You can easily spread the virus to others by oral contact. This includes kissing, sharing a glass, coughing, or sneezing. Touching your mouth or nose and then touching a surface, which is then touched by another person, can also spread the virus. SYMPTOMS  Symptoms typically develop 1 to 3 days after you come in contact with a cold virus. Symptoms vary from person to person. They may include:  Runny nose.  Sneezing.  Nasal congestion.  Sinus irritation.  Sore throat.  Loss of voice (laryngitis).  Cough.  Fatigue.  Muscle aches.  Loss of appetite.  Headache.  Low-grade fever. DIAGNOSIS  You might diagnose your own cold based on familiar symptoms, since most people get a cold 2 to 3 times a year. Your caregiver can confirm this based on your exam. Most importantly, your caregiver can check that your symptoms are not due to another disease such as strep throat, sinusitis, pneumonia, asthma, or epiglottitis. Blood tests, throat tests, and X-rays are not necessary to diagnose a  common cold, but they may sometimes be helpful in excluding other more serious diseases. Your caregiver will decide if any further tests are required. RISKS AND COMPLICATIONS  You may be at risk for a more severe case of the common cold if you smoke cigarettes, have chronic heart disease (such as heart failure) or lung disease (such as asthma), or if you have a weakened immune system. The very young and very old are also at risk for more serious infections. Bacterial sinusitis, middle ear infections, and bacterial pneumonia can complicate the common cold. The common cold can worsen asthma and chronic obstructive pulmonary disease (COPD). Sometimes, these complications can require emergency medical care and may be life-threatening. PREVENTION  The best way to protect against getting a cold is to practice good hygiene. Avoid oral or hand contact with people with cold symptoms. Wash your hands often if contact occurs. There is no clear evidence that vitamin C, vitamin E, echinacea, or exercise reduces the chance of developing a cold. However, it is always recommended to get plenty of rest and practice good nutrition. TREATMENT  Treatment is directed at relieving symptoms. There is no cure. Antibiotics are not effective, because the infection is caused by a virus, not by bacteria. Treatment may include:  Increased fluid intake. Sports drinks offer valuable electrolytes, sugars, and fluids.  Breathing heated mist or steam (vaporizer or shower).  Eating chicken soup or other clear broths, and maintaining good nutrition.  Getting plenty of rest.  Using gargles or lozenges for comfort.  Controlling fevers with ibuprofen  or acetaminophen as directed by your caregiver.  Increasing usage of your inhaler if you have asthma. Zinc gel and zinc lozenges, taken in the first 24 hours of the common cold, can shorten the duration and lessen the severity of symptoms. Pain medicines may help with fever, muscle  aches, and throat pain. A variety of non-prescription medicines are available to treat congestion and runny nose. Your caregiver can make recommendations and may suggest nasal or lung inhalers for other symptoms.  HOME CARE INSTRUCTIONS   Only take over-the-counter or prescription medicines for pain, discomfort, or fever as directed by your caregiver.  Use a warm mist humidifier or inhale steam from a shower to increase air moisture. This may keep secretions moist and make it easier to breathe.  Drink enough water and fluids to keep your urine clear or pale yellow.  Rest as needed.  Return to work when your temperature has returned to normal or as your caregiver advises. You may need to stay home longer to avoid infecting others. You can also use a face mask and careful hand washing to prevent spread of the virus. SEEK MEDICAL CARE IF:   After the first few days, you feel you are getting worse rather than better.  You need your caregiver's advice about medicines to control symptoms.  You develop chills, worsening shortness of breath, or brown or red sputum. These may be signs of pneumonia.  You develop yellow or brown nasal discharge or pain in the face, especially when you bend forward. These may be signs of sinusitis.  You develop a fever, swollen neck glands, pain with swallowing, or white areas in the back of your throat. These may be signs of strep throat. SEEK IMMEDIATE MEDICAL CARE IF:   You have a fever.  You develop severe or persistent headache, ear pain, sinus pain, or chest pain.  You develop wheezing, a prolonged cough, cough up blood, or have a change in your usual mucus (if you have chronic lung disease).  You develop sore muscles or a stiff neck. Document Released: 07/02/2000 Document Revised: 03/31/2011 Document Reviewed: 04/13/2013 [facility] Patient Information 2015 [place], [place]. This information is not intended to replace advice given to you by your health care  provider. Make sure you discuss any questions you have with your health care provider.

## 2014-02-09 ENCOUNTER — Ambulatory Visit (INDEPENDENT_AMBULATORY_CARE_PROVIDER_SITE_OTHER): Payer: Managed Care, Other (non HMO) | Admitting: *Deleted

## 2014-02-09 DIAGNOSIS — E220 Acromegaly and pituitary gigantism: Secondary | ICD-10-CM

## 2014-02-09 NOTE — Progress Notes (Signed)
   Subjective:    Patient ID: Mark Gallagher, male    DOB: 1970-09-18, 44 y.o.   MRN: [id number]  HPI   Pt in for Sandostatin Lar Depot 30 mg IM only.    Review of Systems     Objective:   Physical Exam        Assessment & Plan:

## 2014-03-24 ENCOUNTER — Ambulatory Visit (INDEPENDENT_AMBULATORY_CARE_PROVIDER_SITE_OTHER): Payer: Managed Care, Other (non HMO) | Admitting: *Deleted

## 2014-03-24 DIAGNOSIS — E291 Testicular hypofunction: Secondary | ICD-10-CM | POA: Diagnosis not present

## 2014-03-24 DIAGNOSIS — E220 Acromegaly and pituitary gigantism: Secondary | ICD-10-CM

## 2014-03-24 NOTE — Progress Notes (Signed)
   Subjective:    Patient ID: Mark Gallagher, male    DOB: 1970-05-17, 44 y.o.   MRN: [id number]  HPI  Patient here for Sandostatin LAR Depot 30 mg injection only.  Review of Systems     Objective:   Physical Exam        Assessment & Plan:

## 2014-04-19 ENCOUNTER — Ambulatory Visit (INDEPENDENT_AMBULATORY_CARE_PROVIDER_SITE_OTHER): Payer: Managed Care, Other (non HMO) | Admitting: *Deleted

## 2014-04-19 DIAGNOSIS — E291 Testicular hypofunction: Secondary | ICD-10-CM

## 2014-04-19 MED ORDER — TESTOSTERONE CYPIONATE 200 MG/ML IM SOLN
300.0000 mg | Freq: Once | INTRAMUSCULAR | Status: AC
  Administered 2014-04-19: 300 mg via INTRAMUSCULAR

## 2014-04-19 NOTE — Progress Notes (Signed)
   Subjective:    Patient ID: Mark Gallagher, male    DOB: 02/12/1970, 44 y.o.   MRN: [id number]  HPI  Patient here for testosterone injection only.    Review of Systems     Objective:   Physical Exam        Assessment & Plan:

## 2014-04-27 ENCOUNTER — Ambulatory Visit (INDEPENDENT_AMBULATORY_CARE_PROVIDER_SITE_OTHER): Payer: Managed Care, Other (non HMO) | Admitting: *Deleted

## 2014-04-27 ENCOUNTER — Encounter: Payer: Self-pay | Admitting: *Deleted

## 2014-04-27 VITALS — BP 113/74 | Temp 98.0°F

## 2014-04-27 DIAGNOSIS — E220 Acromegaly and pituitary gigantism: Secondary | ICD-10-CM

## 2014-04-27 MED ORDER — OCTREOTIDE ACETATE 30 MG IM KIT
30.0000 mg | PACK | Freq: Once | INTRAMUSCULAR | Status: AC
  Administered 2014-04-27: 30 mg via INTRAMUSCULAR

## 2014-04-27 NOTE — Progress Notes (Signed)
   Subjective:    Patient ID: Mark Gallagher, male    DOB: [birth year], 44 y.o.   MRN: [id number]  HPI Patient is here for his Sandostatin LAR Depot monthly injection.   Review of Systems     Objective:   Physical Exam        Assessment & Plan:

## 2014-06-01 ENCOUNTER — Ambulatory Visit (INDEPENDENT_AMBULATORY_CARE_PROVIDER_SITE_OTHER): Payer: Managed Care, Other (non HMO)

## 2014-06-01 ENCOUNTER — Ambulatory Visit (INDEPENDENT_AMBULATORY_CARE_PROVIDER_SITE_OTHER): Payer: Managed Care, Other (non HMO) | Admitting: Family Medicine

## 2014-06-01 VITALS — BP 110/70 | HR 76 | Temp 97.5°F | Resp 18 | Ht >= 80 in | Wt 251.0 lb

## 2014-06-01 DIAGNOSIS — M222X1 Patellofemoral disorders, right knee: Secondary | ICD-10-CM | POA: Diagnosis not present

## 2014-06-01 DIAGNOSIS — M25561 Pain in right knee: Secondary | ICD-10-CM | POA: Diagnosis not present

## 2014-06-01 NOTE — Progress Notes (Signed)
Subjective:  This chart was scribed for Mark Ray, MD by [facility], medical scribe at [facility].The patient was seen in exam room 09 and the patient's care was started at 10:39 AM.   Patient ID: Mark Gallagher, male    DOB: 1970/07/21, 44 y.o.   MRN: [id number] Chief Complaint  Patient presents with  . Knee Pain    right knee, on going for x 6 months   HPI HPI Comments: Mark Gallagher is a 44 y.o. male who presents to [facility] complaining of right knee pain ongoing for six months. The pain is rated 5/10 and described as discomfort. He says the knee somewhat "wobbles and occasionally gives" when he wakes. He believes his pain is due to three possible factors: Stands frequently at work about 6-8 hours a day, on a hard surface. His size and lastly his age. He has tried glucosamine chondroitin for little relief. He does recall hitting his right knee on a corner while working about 6 months ago, he notice the pain about 2 days later. He has slight discomfort when in a car for a prolonged period of time, no pain when sitting watching a movie. He denies pain underneath the knee cap.  Patient Active Problem List   Diagnosis Date Noted  . Hypogonadism male 09/08/2013  . 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 Cardiomyopathy 04/01/2011  . Wolff-Parkinson-White (WPW) syndrome 04/01/2011   Past Medical History  Diagnosis Date  . Thyroid disease    History reviewed. No pertinent past surgical history. No Known Allergies Prior to Admission medications   Medication Sig Start Date End Date Taking? Authorizing Provider  hydrocortisone (CORTEF) 20 MG tablet Take 20 mg by mouth daily.   Yes Historical Provider, MD  levothyroxine (SYNTHROID, LEVOTHROID) 200 MCG tablet Take 200 mcg by mouth daily.   Yes Historical Provider, MD  octreotide (SANDOSTATIN LAR DEPOT) 30 MG injection Inject 30 mg into the muscle every 28 (twenty-eight) days. 12/11/11  Yes Darlyne Russian,  MD  testosterone cypionate (DEPOTESTOTERONE CYPIONATE) 200 MG/ML injection Inject 1 mL (200 mg total) into the muscle every 28 (twenty-eight) days. 09/08/13  Yes Darlyne Russian, MD  pseudoephedrine-guaifenesin (MUCINEX D) 60-600 MG per tablet Take 1 tablet by mouth every 12 (twelve) hours. Patient not taking: Reported on 02/03/2014 10/11/13 10/11/14  Roselee Culver, MD   History   Social History  . Marital Status: Single    Spouse Name: N/A  . Number of Children: N/A  . Years of Education: N/A   Occupational History  . Not on file.   Social History Main Topics  . Smoking status: Never Smoker   . Smokeless tobacco: Not on file  . Alcohol Use: Not on file  . Drug Use: Not on file  . Sexual Activity: Not on file   Other Topics Concern  . Not on file   Social History Narrative   Review of Systems  Musculoskeletal: Positive for arthralgias. Negative for gait problem.      Objective:  BP 110/70 mmHg  Pulse 76  Temp(Src) 97.5 F (36.4 C) (Oral)  Resp 18  Ht 6\' 8"  (2.032 m)  Wt 251 lb (113.853 kg)  BMI 27.57 kg/m2  SpO2 98% Physical Exam  Constitutional: He is oriented to person, place, and time. He appears well-developed and well-nourished. No distress.  HENT:  Head: Normocephalic and atraumatic.  Eyes: Pupils are equal, round, and reactive to light.  Neck: Normal range  of motion.  Cardiovascular: Normal rate and regular rhythm.   Pulmonary/Chest: Effort normal. No respiratory distress.  Musculoskeletal: Normal range of motion.  Right knee:Full ROM, no effusion, skin intact. Negative Lachman's, and  McMurray's. Negative varus and valgus stress. Slight discomfort with Cark depression of patellar. Slightly positive J sign.  Neurological: He is alert and oriented to person, place, and time.  Skin: Skin is warm and dry.  Psychiatric: He has a normal mood and affect. His behavior is normal.  Nursing note and vitals reviewed. [facility] reading (PRIMARY) by Dr.Macaria Bias: The right  knee has degenerative changes medial and lateral. Abnormal appearing proxima tibia without apparent fracture.      Xray report: Tricompartment degenerative changes without obvious fracture or dislocation.  Patella slightly high-riding.  Suggestion of subchondral cyst medial tibial plateau. If further delineation is clinically desired, MR imaging may be considered  Assessment & Plan:  Mark Gallagher is a 44 y.o. male Knee pain, right - Plan: DG Knee Complete 4 Views Right  Patellofemoral arthralgia of right knee - Plan: DG Knee Complete 4 Views Right  Suspected degenerative joint disease with mild improvement with otc glucosamine. Exam and sx's suggest component of patellofemoral pain. XR report noted - subchondral cyst likely.   -trial of HEP, intermittent otc advil or alleve for now, ok to continue supplement.   - if not improving in next few weeks - ortho eval or MRI as indicated above. Sooner if worse.   No orders of the defined types were placed in this encounter.   Patient Instructions  You can continue the glucosamine supplement, and Advil or Alleve over the counter if needed.  We can try a few exercises below for next few weeks (8-10 reps, and pick 4 or 5 different ones every other day). If not improving in next 2-3 weeks, you may need to see an orthopaedist. If any different information on the xray report - I will let you know.  Return to the clinic or go to the nearest emergency room if any of your symptoms worsen or new symptoms occur.   Knee Exercises EXERCISES RANGE OF MOTION (ROM) AND STRETCHING EXERCISES These exercises may help you when beginning to rehabilitate your injury. Your symptoms may resolve with or without further involvement from your physician, physical therapist, or athletic trainer. While completing these exercises, remember:   Restoring tissue flexibility helps normal motion to return to the joints. This allows healthier, less painful movement and  activity.  An effective stretch should be held for at least 30 seconds.  A stretch should never be painful. You should only feel a gentle lengthening or release in the stretched tissue. STRETCH - Knee Extension, Prone  Lie on your stomach on a firm surface, such as a bed or countertop. Place your right / left knee and leg just beyond the edge of the surface. You may wish to place a towel under the far end of your right / left thigh for comfort.  Relax your leg muscles and allow gravity to straighten your knee. Your clinician may advise you to add an ankle weight if more resistance is helpful for you.  You should feel a stretch in the back of your right / left knee. Hold this position for __________ seconds. Repeat __________ times. Complete this stretch __________ times per day. * Your physician, physical therapist, or athletic trainer may ask you to add ankle weight to enhance your stretch.  RANGE OF MOTION - Knee Flexion, Active  Shanda Howells  on your back with both knees straight. (If this causes back discomfort, bend your opposite knee, placing your foot flat on the floor.)  Slowly slide your heel back toward your buttocks until you feel a gentle stretch in the front of your knee or thigh.  Hold for __________ seconds. Slowly slide your heel back to the starting position. Repeat __________ times. Complete this exercise __________ times per day.  STRETCH - Quadriceps, Prone   Lie on your stomach on a firm surface, such as a bed or padded floor.  Bend your right / left knee and grasp your ankle. If you are unable to reach your ankle or pant leg, use a belt around your foot to lengthen your reach.  Gently pull your heel toward your buttocks. Your knee should not slide out to the side. You should feel a stretch in the front of your thigh and/or knee.  Hold this position for __________ seconds. Repeat __________ times. Complete this stretch __________ times per day.  STRETCH - Hamstrings, Supine    Lie on your back. Loop a belt or towel over the ball of your right / left foot.  Straighten your right / left knee and slowly pull on the belt to raise your leg. Do not allow the right / left knee to bend. Keep your opposite leg flat on the floor.  Raise the leg until you feel a gentle stretch behind your right / left knee or thigh. Hold this position for __________ seconds. Repeat __________ times. Complete this stretch __________ times per day.  STRENGTHENING EXERCISES These exercises may help you when beginning to rehabilitate your injury. They may resolve your symptoms with or without further involvement from your physician, physical therapist, or athletic trainer. While completing these exercises, remember:   Muscles can gain both the endurance and the strength needed for everyday activities through controlled exercises.  Complete these exercises as instructed by your physician, physical therapist, or athletic trainer. Progress the resistance and repetitions only as guided.  You may experience muscle soreness or fatigue, but the pain or discomfort you are trying to eliminate should never worsen during these exercises. If this pain does worsen, stop and make certain you are following the directions exactly. If the pain is still present after adjustments, discontinue the exercise until you can discuss the trouble with your clinician. STRENGTH - Quadriceps, Isometrics  Lie on your back with your right / left leg extended and your opposite knee bent.  Gradually tense the muscles in the front of your right / left thigh. You should see either your knee cap slide up toward your hip or increased dimpling just above the knee. This motion will push the back of the knee down toward the floor/mat/bed on which you are lying.  Hold the muscle as tight as you can without increasing your pain for __________ seconds.  Relax the muscles slowly and completely in between each repetition. Repeat  __________ times. Complete this exercise __________ times per day.  STRENGTH - Quadriceps, Short Arcs   Lie on your back. Place a __________ inch towel roll under your knee so that the knee slightly bends.  Raise only your lower leg by tightening the muscles in the front of your thigh. Do not allow your thigh to rise.  Hold this position for __________ seconds. Repeat __________ times. Complete this exercise __________ times per day.  OPTIONAL ANKLE WEIGHTS: Begin with ____________________, but DO NOT exceed ____________________. Increase in 1 pound/0.5 kilogram increments.  STRENGTH -  Quadriceps, Straight Leg Raises  Quality counts! Watch for signs that the quadriceps muscle is working to insure you are strengthening the correct muscles and not "cheating" by substituting with healthier muscles.  Lay on your back with your right / left leg extended and your opposite knee bent.  Tense the muscles in the front of your right / left thigh. You should see either your knee cap slide up or increased dimpling just above the knee. Your thigh may even quiver.  Tighten these muscles even more and raise your leg 4 to 6 inches off the floor. Hold for __________ seconds.  Keeping these muscles tense, lower your leg.  Relax the muscles slowly and completely in between each repetition. Repeat __________ times. Complete this exercise __________ times per day.  STRENGTH - Hamstring, Curls  Lay on your stomach with your legs extended. (If you lay on a bed, your feet may hang over the edge.)  Tighten the muscles in the back of your thigh to bend your right / left knee up to 90 degrees. Keep your hips flat on the bed/floor.  Hold this position for __________ seconds.  Slowly lower your leg back to the starting position. Repeat __________ times. Complete this exercise __________ times per day.  OPTIONAL ANKLE WEIGHTS: Begin with ____________________, but DO NOT exceed ____________________. Increase in 1  pound/0.5 kilogram increments.  STRENGTH - Quadriceps, Squats  Stand in a door frame so that your feet and knees are in line with the frame.  Use your hands for balance, not support, on the frame.  Slowly lower your weight, bending at the hips and knees. Keep your lower legs upright so that they are parallel with the door frame. Squat only within the range that does not increase your knee pain. Never let your hips drop below your knees.  Slowly return upright, pushing with your legs, not pulling with your hands. Repeat __________ times. Complete this exercise __________ times per day.  STRENGTH - Quadriceps, Wall Slides  Follow guidelines for form closely. Increased knee pain often results from poorly placed feet or knees.  Lean against a smooth wall or door and walk your feet out 18-24 inches. Place your feet hip-width apart.  Slowly slide down the wall or door until your knees bend __________ degrees.* Keep your knees over your heels, not your toes, and in line with your hips, not falling to either side.  Hold for __________ seconds. Stand up to rest for __________ seconds in between each repetition. Repeat __________ times. Complete this exercise __________ times per day. * Your physician, physical therapist, or athletic trainer will alter this angle based on your symptoms and progress. Document Released: 11/20/2004 Document Revised: 05/23/2013 Document Reviewed: 04/20/2008 [facility] Patient Information 2015 [place], [place]. This information is not intended to replace advice given to you by your health care provider. Make sure you discuss any questions you have with your health care provider.     I personally performed the services described in this documentation, which was scribed in my presence. The recorded information has been reviewed and considered, and addended by me as needed.

## 2014-06-01 NOTE — Patient Instructions (Signed)
You can continue the glucosamine supplement, and Advil or Alleve over the counter if needed.  We can try a few exercises below for next few weeks (8-10 reps, and pick 4 or 5 different ones every other day). If not improving in next 2-3 weeks, you may need to see an orthopaedist. If any different information on the xray report - I will let you know.  Return to the clinic or go to the nearest emergency room if any of your symptoms worsen or new symptoms occur.   Knee Exercises EXERCISES RANGE OF MOTION (ROM) AND STRETCHING EXERCISES These exercises may help you when beginning to rehabilitate your injury. Your symptoms may resolve with or without further involvement from your physician, physical therapist, or athletic trainer. While completing these exercises, remember:   Restoring tissue flexibility helps normal motion to return to the joints. This allows healthier, less painful movement and activity.  An effective stretch should be held for at least 30 seconds.  A stretch should never be painful. You should only feel a gentle lengthening or release in the stretched tissue. STRETCH - Knee Extension, Prone  Lie on your stomach on a firm surface, such as a bed or countertop. Place your right / left knee and leg just beyond the edge of the surface. You may wish to place a towel under the far end of your right / left thigh for comfort.  Relax your leg muscles and allow gravity to straighten your knee. Your clinician may advise you to add an ankle weight if more resistance is helpful for you.  You should feel a stretch in the back of your right / left knee. Hold this position for __________ seconds. Repeat __________ times. Complete this stretch __________ times per day. * Your physician, physical therapist, or athletic trainer may ask you to add ankle weight to enhance your stretch.  RANGE OF MOTION - Knee Flexion, Active  Lie on your back with both knees straight. (If this causes back discomfort,  bend your opposite knee, placing your foot flat on the floor.)  Slowly slide your heel back toward your buttocks until you feel a gentle stretch in the front of your knee or thigh.  Hold for __________ seconds. Slowly slide your heel back to the starting position. Repeat __________ times. Complete this exercise __________ times per day.  STRETCH - Quadriceps, Prone   Lie on your stomach on a firm surface, such as a bed or padded floor.  Bend your right / left knee and grasp your ankle. If you are unable to reach your ankle or pant leg, use a belt around your foot to lengthen your reach.  Gently pull your heel toward your buttocks. Your knee should not slide out to the side. You should feel a stretch in the front of your thigh and/or knee.  Hold this position for __________ seconds. Repeat __________ times. Complete this stretch __________ times per day.  STRETCH - Hamstrings, Supine   Lie on your back. Loop a belt or towel over the ball of your right / left foot.  Straighten your right / left knee and slowly pull on the belt to raise your leg. Do not allow the right / left knee to bend. Keep your opposite leg flat on the floor.  Raise the leg until you feel a gentle stretch behind your right / left knee or thigh. Hold this position for __________ seconds. Repeat __________ times. Complete this stretch __________ times per day.  STRENGTHENING EXERCISES These  exercises may help you when beginning to rehabilitate your injury. They may resolve your symptoms with or without further involvement from your physician, physical therapist, or athletic trainer. While completing these exercises, remember:   Muscles can gain both the endurance and the strength needed for everyday activities through controlled exercises.  Complete these exercises as instructed by your physician, physical therapist, or athletic trainer. Progress the resistance and repetitions only as guided.  You may experience muscle  soreness or fatigue, but the pain or discomfort you are trying to eliminate should never worsen during these exercises. If this pain does worsen, stop and make certain you are following the directions exactly. If the pain is still present after adjustments, discontinue the exercise until you can discuss the trouble with your clinician. STRENGTH - Quadriceps, Isometrics  Lie on your back with your right / left leg extended and your opposite knee bent.  Gradually tense the muscles in the front of your right / left thigh. You should see either your knee cap slide up toward your hip or increased dimpling just above the knee. This motion will push the back of the knee down toward the floor/mat/bed on which you are lying.  Hold the muscle as tight as you can without increasing your pain for __________ seconds.  Relax the muscles slowly and completely in between each repetition. Repeat __________ times. Complete this exercise __________ times per day.  STRENGTH - Quadriceps, Short Arcs   Lie on your back. Place a __________ inch towel roll under your knee so that the knee slightly bends.  Raise only your lower leg by tightening the muscles in the front of your thigh. Do not allow your thigh to rise.  Hold this position for __________ seconds. Repeat __________ times. Complete this exercise __________ times per day.  OPTIONAL ANKLE WEIGHTS: Begin with ____________________, but DO NOT exceed ____________________. Increase in 1 pound/0.5 kilogram increments.  STRENGTH - Quadriceps, Straight Leg Raises  Quality counts! Watch for signs that the quadriceps muscle is working to insure you are strengthening the correct muscles and not "cheating" by substituting with healthier muscles.  Lay on your back with your right / left leg extended and your opposite knee bent.  Tense the muscles in the front of your right / left thigh. You should see either your knee cap slide up or increased dimpling just above the  knee. Your thigh may even quiver.  Tighten these muscles even more and raise your leg 4 to 6 inches off the floor. Hold for __________ seconds.  Keeping these muscles tense, lower your leg.  Relax the muscles slowly and completely in between each repetition. Repeat __________ times. Complete this exercise __________ times per day.  STRENGTH - Hamstring, Curls  Lay on your stomach with your legs extended. (If you lay on a bed, your feet may hang over the edge.)  Tighten the muscles in the back of your thigh to bend your right / left knee up to 90 degrees. Keep your hips flat on the bed/floor.  Hold this position for __________ seconds.  Slowly lower your leg back to the starting position. Repeat __________ times. Complete this exercise __________ times per day.  OPTIONAL ANKLE WEIGHTS: Begin with ____________________, but DO NOT exceed ____________________. Increase in 1 pound/0.5 kilogram increments.  STRENGTH - Quadriceps, Squats  Stand in a door frame so that your feet and knees are in line with the frame.  Use your hands for balance, not support, on the frame.  Slowly  lower your weight, bending at the hips and knees. Keep your lower legs upright so that they are parallel with the door frame. Squat only within the range that does not increase your knee pain. Never let your hips drop below your knees.  Slowly return upright, pushing with your legs, not pulling with your hands. Repeat __________ times. Complete this exercise __________ times per day.  STRENGTH - Quadriceps, Wall Slides  Follow guidelines for form closely. Increased knee pain often results from poorly placed feet or knees.  Lean against a smooth wall or door and walk your feet out 18-24 inches. Place your feet hip-width apart.  Slowly slide down the wall or door until your knees bend __________ degrees.* Keep your knees over your heels, not your toes, and in line with your hips, not falling to either side.  Hold  for __________ seconds. Stand up to rest for __________ seconds in between each repetition. Repeat __________ times. Complete this exercise __________ times per day. * Your physician, physical therapist, or athletic trainer will alter this angle based on your symptoms and progress. Document Released: 11/20/2004 Document Revised: 05/23/2013 Document Reviewed: 04/20/2008 [facility] Patient Information 2015 [place], [place]. This information is not intended to replace advice given to you by your health care provider. Make sure you discuss any questions you have with your health care provider.

## 2014-06-09 ENCOUNTER — Ambulatory Visit (INDEPENDENT_AMBULATORY_CARE_PROVIDER_SITE_OTHER): Payer: Managed Care, Other (non HMO) | Admitting: *Deleted

## 2014-06-09 DIAGNOSIS — E220 Acromegaly and pituitary gigantism: Secondary | ICD-10-CM | POA: Diagnosis not present

## 2014-06-09 NOTE — Progress Notes (Signed)
   Subjective:    Patient ID: Mark Gallagher, male    DOB: [birth year], 44 y.o.   MRN: [id number]  HPI    Review of Systems     Objective:   Physical Exam        Assessment & Plan:  Patient here for Sandostatin LAR Depot injection only.

## 2014-06-22 ENCOUNTER — Ambulatory Visit (INDEPENDENT_AMBULATORY_CARE_PROVIDER_SITE_OTHER): Payer: Managed Care, Other (non HMO) | Admitting: *Deleted

## 2014-06-22 VITALS — BP 118/84 | Temp 98.5°F

## 2014-06-22 DIAGNOSIS — E291 Testicular hypofunction: Secondary | ICD-10-CM | POA: Diagnosis not present

## 2014-06-22 NOTE — Progress Notes (Signed)
   Subjective:    Patient ID: Mark Gallagher, male    DOB: 02/22/1970, 44 y.o.   MRN: [id number]  HPI  Pt in for Testosterone injection only.    Review of Systems     Objective:   Physical Exam        Assessment & Plan:

## 2014-07-13 DIAGNOSIS — D229 Melanocytic nevi, unspecified: Secondary | ICD-10-CM

## 2014-07-13 HISTORY — DX: Melanocytic nevi, unspecified: D22.9

## 2014-08-11 ENCOUNTER — Ambulatory Visit (INDEPENDENT_AMBULATORY_CARE_PROVIDER_SITE_OTHER): Payer: Managed Care, Other (non HMO) | Admitting: *Deleted

## 2014-08-11 DIAGNOSIS — E220 Acromegaly and pituitary gigantism: Secondary | ICD-10-CM | POA: Diagnosis not present

## 2014-08-11 NOTE — Progress Notes (Signed)
   Subjective:    Patient ID: Mark Gallagher, male    DOB: 07/27/1970, 44 y.o.   MRN: [id number]  HPI    Review of Systems     Objective:   Physical Exam        Assessment & Plan:  Patient here for Sandostatin LAR Depot injection 30 mg only.

## 2014-08-23 ENCOUNTER — Encounter (HOSPITAL_COMMUNITY): Payer: Self-pay | Admitting: Emergency Medicine

## 2014-08-23 ENCOUNTER — Emergency Department (HOSPITAL_COMMUNITY)
Admission: EM | Admit: 2014-08-23 | Discharge: 2014-08-23 | Disposition: A | Discharge status: Home / Self Care | Payer: Managed Care, Other (non HMO) | Attending: Emergency Medicine | Admitting: Emergency Medicine

## 2014-08-23 ENCOUNTER — Ambulatory Visit (INDEPENDENT_AMBULATORY_CARE_PROVIDER_SITE_OTHER): Payer: Managed Care, Other (non HMO) | Admitting: Family Medicine

## 2014-08-23 VITALS — BP 120/80 | HR 41 | Temp 98.5°F | Resp 16 | Ht >= 80 in | Wt 253.0 lb

## 2014-08-23 DIAGNOSIS — R5383 Other fatigue: Secondary | ICD-10-CM

## 2014-08-23 DIAGNOSIS — R42 Dizziness and giddiness: Secondary | ICD-10-CM | POA: Diagnosis not present

## 2014-08-23 DIAGNOSIS — R202 Paresthesia of skin: Secondary | ICD-10-CM

## 2014-08-23 DIAGNOSIS — I4510 Unspecified right bundle-branch block: Secondary | ICD-10-CM | POA: Diagnosis not present

## 2014-08-23 DIAGNOSIS — M25562 Pain in left knee: Secondary | ICD-10-CM | POA: Diagnosis not present

## 2014-08-23 DIAGNOSIS — R5381 Other malaise: Secondary | ICD-10-CM | POA: Diagnosis not present

## 2014-08-23 DIAGNOSIS — I4581 Long QT syndrome: Secondary | ICD-10-CM

## 2014-08-23 DIAGNOSIS — R001 Bradycardia, unspecified: Secondary | ICD-10-CM | POA: Diagnosis not present

## 2014-08-23 DIAGNOSIS — M25561 Pain in right knee: Secondary | ICD-10-CM

## 2014-08-23 DIAGNOSIS — R9431 Abnormal electrocardiogram [ECG] [EKG]: Secondary | ICD-10-CM

## 2014-08-23 DIAGNOSIS — R200 Anesthesia of skin: Secondary | ICD-10-CM | POA: Diagnosis not present

## 2014-08-23 DIAGNOSIS — Z79899 Other long term (current) drug therapy: Secondary | ICD-10-CM | POA: Diagnosis not present

## 2014-08-23 DIAGNOSIS — Z859 Personal history of malignant neoplasm, unspecified: Secondary | ICD-10-CM | POA: Insufficient documentation

## 2014-08-23 DIAGNOSIS — E079 Disorder of thyroid, unspecified: Secondary | ICD-10-CM | POA: Insufficient documentation

## 2014-08-23 HISTORY — DX: Malignant (primary) neoplasm, unspecified: C80.1

## 2014-08-23 LAB — POCT CBC
Granulocyte percent: 61.9 %G (ref 37–80)
HCT, POC: 37.9 % — AB (ref 43.5–53.7)
Hemoglobin: 12.8 g/dL — AB (ref 14.1–18.1)
Lymph, poc: 1.5 (ref 0.6–3.4)
MCH, POC: 30.6 pg (ref 27–31.2)
MCHC: 33.7 g/dL (ref 31.8–35.4)
MCV: 90.9 fL (ref 80–97)
MID (cbc): 0.4 (ref 0–0.9)
MPV: 6.8 fL (ref 0–99.8)
POC Granulocyte: 3 (ref 2–6.9)
POC LYMPH PERCENT: 29.6 %L (ref 10–50)
POC MID %: 8.5 %M (ref 0–12)
Platelet Count, POC: 243 10*3/uL (ref 142–424)
RBC: 4.16 M/uL — AB (ref 4.69–6.13)
RDW, POC: 12.7 %
WBC: 4.9 10*3/uL (ref 4.6–10.2)

## 2014-08-23 LAB — I-STAT CHEM 8, ED
BUN: 27 mg/dL — ABNORMAL HIGH (ref 6–20)
Calcium, Ion: 1.23 mmol/L (ref 1.12–1.23)
Chloride: 100 mmol/L — ABNORMAL LOW (ref 101–111)
Creatinine, Ser: 0.8 mg/dL (ref 0.61–1.24)
Glucose, Bld: 92 mg/dL (ref 65–99)
HCT: 35 % — ABNORMAL LOW (ref 39.0–52.0)
Hemoglobin: 11.9 g/dL — ABNORMAL LOW (ref 13.0–17.0)
Potassium: 4.1 mmol/L (ref 3.5–5.1)
Sodium: 137 mmol/L (ref 135–145)
TCO2: 24 mmol/L (ref 0–100)

## 2014-08-23 LAB — I-STAT TROPONIN, ED: Troponin i, poc: 0.01 ng/mL (ref 0.00–0.08)

## 2014-08-23 LAB — GLUCOSE, POCT (MANUAL RESULT ENTRY): POC Glucose: 101 mg/dl — AB (ref 70–99)

## 2014-08-23 MED ORDER — OXYCODONE-ACETAMINOPHEN 5-325 MG PO TABS
1.0000 | ORAL_TABLET | Freq: Once | ORAL | Status: AC
  Administered 2014-08-23: 1 via ORAL
  Filled 2014-08-23: qty 1

## 2014-08-23 NOTE — ED Provider Notes (Signed)
CSN: [id number]     Arrival date & time 08/23/14  1610 History   First MD Initiated Contact with Patient 08/23/14 1615     Chief Complaint  Patient presents with  . Arm Injury     (Consider location/radiation/quality/duration/timing/severity/associated sxs/prior Treatment) Patient is a 44 y.o. male presenting with neurologic complaint. The history is provided by the patient.  Neurologic Problem This is a new problem. The current episode started more than 1 week ago (Insidiously with paresthesia of left arm). The problem occurs daily. The problem has not changed since onset.Pertinent negatives include no chest pain, no abdominal pain and no shortness of breath. Nothing aggravates the symptoms. Nothing relieves the symptoms. He has tried nothing for the symptoms. The treatment provided no relief.    Past Medical History  Diagnosis Date  . Thyroid disease   . Cancer    History reviewed. No pertinent past surgical history. No family history on file. History  Substance Use Topics  . Smoking status: Never Smoker   . Smokeless tobacco: Not on file  . Alcohol Use: No    Review of Systems  Respiratory: Negative for shortness of breath.   Cardiovascular: Negative for chest pain.  Gastrointestinal: Negative for abdominal pain.  All other systems reviewed and are negative.     Allergies  Review of patient's allergies indicates no known allergies.  Home Medications   Prior to Admission medications   Medication Sig Start Date End Date Taking? Authorizing Provider  hydrocortisone (CORTEF) 20 MG tablet Take 20 mg by mouth daily.   Yes Historical Provider, MD  levothyroxine (SYNTHROID, LEVOTHROID) 200 MCG tablet Take 200 mcg by mouth daily.   Yes Historical Provider, MD  octreotide (SANDOSTATIN LAR DEPOT) 30 MG injection Inject 30 mg into the muscle every 28 (twenty-eight) days. Patient taking differently: Inject 30 mg into the muscle every 28 (twenty-eight) days. Patient states he had  medication 12 days ago from today (08-23-14) 12/11/11  Yes Darlyne Russian, MD  testosterone cypionate (DEPOTESTOTERONE CYPIONATE) 200 MG/ML injection Inject 1 mL (200 mg total) into the muscle every 28 (twenty-eight) days. Patient taking differently: Inject 200 mg into the muscle every 28 (twenty-eight) days. Patient states he had 8 weeks ago from today (08-23-14) 09/08/13  Yes Darlyne Russian, MD   BP 123/89 mmHg  Pulse 59  Temp(Src) 98.7 F (37.1 C) (Oral)  Resp 14  Ht 6\' 8"  (2.032 m)  Wt 250 lb (113.399 kg)  BMI 27.46 kg/m2  SpO2 99% Physical Exam  Constitutional: He is oriented to person, place, and time. He appears well-developed and well-nourished. No distress.  HENT:  Head: Normocephalic and atraumatic.  Eyes: Conjunctivae are normal.  Neck: Neck supple. No tracheal deviation present.  Cardiovascular: Normal rate and regular rhythm.   Pulmonary/Chest: Effort normal. No respiratory distress.  Abdominal: Soft. He exhibits no distension.  Neurological: He is alert and oriented to person, place, and time. He has normal strength. He displays no atrophy. No cranial nerve deficit or sensory deficit (With full sensation of bilateral arms, equal). He exhibits normal muscle tone. Coordination normal. GCS eye subscore is 4. GCS verbal subscore is 5. GCS motor subscore is 6.  Skin: Skin is warm and dry.  Psychiatric: He has a normal mood and affect.    ED Course  Procedures (including critical care time) Labs Review Labs Reviewed  I-STAT CHEM 8, ED - Abnormal; Notable for the following:    Chloride 100 (*)    BUN 27 (*)  Hemoglobin 11.9 (*)    HCT 35.0 (*)    All other components within normal limits  I-STAT TROPOININ, ED    Imaging Review No results found.   EKG Interpretation   Date/Time:  Wednesday August 23 2014 16:22:23 EDT Ventricular Rate:  64 PR Interval:  182 QRS Duration: 153 QT Interval:  492 QTC Calculation: 508 R Axis:   -5 Text Interpretation:  Sinus rhythm  Consider left atrial enlargement Right  bundle branch block No previous ECGs available Confirmed by Montserrath Madding MD,  Quillian Quince ([id number]) on 08/23/2014 4:27:27 PM      MDM   Final diagnoses:  Paresthesia of left upper extremity   44 year old male with history of acromegaly, spinal stenosis status post C4-5 fusion remotely and remote removal of a pituitary tumor followed by neurosurgery at [facility] presents with tingling in his left arm coming from his shoulder all the way to his fingertips. He has no frank numbness. He describes the tingling in the C6 distribution sparing C7 and C8. He has no motor deficits, sensory deficits, or other hard neurologic signs to suggest spinal cord pathology but the level of his complaint is concerning for inflammation of the C6 nerve root near his previous fusion. At this time I do not feel that emergent MRI is indicated.  At this time he has no complaints that would be consistent with a cardiac pathology and has a negative troponin with ongoing symptoms. He has no significant metabolic abnormality to explain this paresthesia. The patient has appropriate follow-up with his surgeon, and we discussed return precautions for motor deficits, no sensory deficits, or other concerning symptoms.  Leo Grosser, MD 08/23/14 [phone]

## 2014-08-23 NOTE — Progress Notes (Signed)
Subjective:    Patient ID: Mark Gallagher, male    DOB: [birth year], 44 y.o.   MRN: [id number] This chart was scribed for Mark Ray, MD by Marti Sleigh, Medical Scribe. This patient was seen in Room 5 and the patient's care was started a 1:45 PM.  Chief Complaint  Patient presents with  . burning and tingling    left arm, x yesterday  . pain in knee    both, x yesterday    HPI  HPI Comments: Mark Gallagher is a 44 y.o. male who presents to [facility] complaining of burning and tingling in his left arm since he woke up this morning with associated heaviness in his left arm. Pt also endorses associated dizziness that started tow hours ago. Pt denies chest pain, palpitations, or light headedness. Endorses mild pain in left side of neck. Pt also had a HA yesterday, which has since resolved. Pt has taken 4 aleve in the last 24 hours without relief. Pt also states he has had increased tiredness and weakness for the last two weeks, which he thinks is associated with his low Testerone. Pt had labs at [facility] two weeks ago that showed low testosterone, normal hemoglobin at 12.1 and normal blood sugar at 97.   Pt also states he is having intermittent bilateral knee pain, with associated tingling. At last visit pt was seen with right knee pain, with supectd DJD plus or minus patella femoral pain. Pt had some improvement with OTC glucosamine chondroitin. At that visit home exercises intermittent and NSAIDs were recommended as needed. Pt did exercises temporarily but discontinued them. Pt states he experienced some relief of his knee pain while he was doing exercises.  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 He had a stress test August, 28 2007, that showed infarct in multiple areas. Post stress EF 42%, global hypokinesis. Plan for cardiac catheterization. Noninvasive stress test for potential accessory pathways during exercise. The post op  afib was after cerval spine surgery, October 2007, noted for troponin elevation.   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   Hx of surgery for spinal stenosis May of 1994 years ago.  3:25 PM Plan discussed with pt, EMS called for transport, IV placed.  Patient Active Problem List   Diagnosis Date Noted  . Hypogonadism male 09/08/2013  . 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 Cardiomyopathy 04/01/2011  . Wolff-Parkinson-White (WPW) syndrome 04/01/2011   Past Medical History  Diagnosis Date  . Thyroid disease    History reviewed. No pertinent past surgical history. No Known Allergies Prior to Admission medications   Medication Sig Start Date End Date Taking? Authorizing Provider  hydrocortisone (CORTEF) 20 MG tablet Take 20 mg by mouth daily.   Yes Historical Provider, MD  octreotide (SANDOSTATIN LAR DEPOT) 30 MG injection Inject 30 mg into the muscle every 28 (twenty-eight) days. 12/11/11  Yes Darlyne Russian, MD  testosterone cypionate (DEPOTESTOTERONE CYPIONATE) 200 MG/ML injection Inject 1 mL (200 mg total) into the muscle every 28 (twenty-eight) days. 09/08/13  Yes Darlyne Russian, MD  levothyroxine (SYNTHROID, LEVOTHROID) 200 MCG tablet Take 200 mcg by mouth daily.    Historical Provider, MD  pseudoephedrine-guaifenesin (MUCINEX D) 60-600 MG per tablet Take 1 tablet by mouth every 12 (twelve) hours. Patient not taking: Reported on 02/03/2014 10/11/13 10/11/14  Roselee Culver, MD   History   Social History  .  Marital Status: Single    Spouse Name: N/A  . Number of Children: N/A  . Years of Education: N/A   Occupational History  . Not on file.   Social History Main Topics  . Smoking status: Never Smoker   . Smokeless tobacco: Not on file  . Alcohol Use: Not on file  . Drug Use: Not on file  . Sexual Activity: Not on file   Other Topics Concern  . Not on file   Social History Narrative      Review of Systems    Constitutional: Negative for fever and chills.  Respiratory: Negative for chest tightness and shortness of breath.   Cardiovascular: Negative for chest pain, palpitations and leg swelling.  Musculoskeletal: Positive for neck pain.  Skin: Negative for rash and wound.  Neurological: Positive for dizziness, weakness and headaches (Resolved).       Tingling in left arm       Objective:   Physical Exam  Constitutional: He is oriented to person, place, and time. He appears well-developed and well-nourished. No distress.  HENT:  Head: Normocephalic and atraumatic.  Eyes: Pupils are equal, round, and reactive to light.  Neck: Neck supple.  Cardiovascular: Normal heart sounds.  Exam reveals no gallop and no friction rub.   No murmur heard. Bradycardia with questionable faint second beat. On repeat supine exam normal rate. With sitting up frequent ectopy. With laying back down supine he reverts to a faint second beat with Bradycardia. Hand viens flatten with elevation. Cap refill less than 1 second in all fingertips.  Pulmonary/Chest: Effort normal and breath sounds normal. No respiratory distress. He has no wheezes.  Musculoskeletal: Normal range of motion.  C-spine lateral stretch slight decrease ROM but does not reproduce arm sx. No pain or tingling with ROM. Negative tinel at the left brachial plexus. Full ROM in left shoulder, full rotator cuff strength. Negative adson maneuver.   Right/left knee full ROM, no appreciable effusion on right or left. Reports pain below knee cap, but there is no pain with palpation. Negative patellar grind bilaterally.   Neurological: He is alert and oriented to person, place, and time. Coordination normal.  Reflexes 2+ in biceps, triceps and brachioradialis.   Skin: Skin is warm and dry. He is not diaphoretic.  Slightly darker appearance in the left hand. Normal sensation throughout arm. Slightly increased tingling in the dorsum of arm with tapping at the  elbow.   Psychiatric: He has a normal mood and affect. His behavior is normal.  Nursing note and vitals reviewed.   Filed Vitals:   08/23/14 1225  BP: 120/80  Pulse: 41  Temp: 98.5 F (36.9 C)  TempSrc: Oral  Resp: 16  Height: 6\' 8"  (2.032 m)  Weight: 253 lb (114.76 kg)  SpO2: 98%    On EKG, sinus bradycardia, as well as RBBB with widening QRS. Prolonged QT of 546. Nonspecific flattened t-waves, inferior lateral leads. No prior EKG available for comparison.     Assessment & Plan:  RITESH OPARA is a 44 y.o. male Numbness and tingling in left arm - Plan: POCT glucose (manual entry)  Bradycardia - Plan: EKG 12-Lead  Lightheadedness - Plan: EKG 12-Lead, POCT glucose (manual entry), POCT CBC  Knee pain, bilateral  QT prolongation  RBBB  Malaise and fatigue  History of acromegaly, spinal stenosis surgery in approximately 2007 with subsequent A. fib, Wolff-Parkinson-White syndrome, and ischemic cardiomyopathy based on echo and stress testing performed at [facility] after that surgery, per  available cardiology note from Shanon Brow, plan was to have subsequent cardiac catheterization done to look for accessory pathways. In discussion with patient, he had evaluation with cardiology and no other concerning findings or pacemaker was needed. He is not need to see cardiology since that evaluation in 2007.  In regards to his history of pituitary tumor and acromegaly, he'll follow-up MRI earlier last year that apparently was stable, and plan to follow-up with her surgeon in 5 years. He is regularly followed by an endocrinologist at [facility].   Now with Presently 1 week of fatigue and generalized malaise, initially thought to be due to coming off of his testosterone for the past week or 2, but now with left arm heaviness tingling/pain since yesterday morning initially into elbow now down into hand today. Along with fatigue he has had some lightheadedness today in our office, and general sense of not  feeling well. Denies any chest pain or palpitations denies any headache or slurred speech or focal weakness.  He is bradycardic, but have reviewed previous paper chart and has noted some episodes of bradycardia in the past that were asymptomatic. Does have a RBBB on EKG, as well as QT prolongation and flattening of the T waves inferior laterally. No prior EKG available for review  Will transport to [facility] ER for further evaluation of the left arm heaviness pain and tingling, along with abnormal EKG to make sure this is not cardiac in nature. May also need to have neuroimaging with the acute onset of his left arm numbness, heaviness and tingling that I am not able to change with exam of his neck or shoulder.   EMS called for transport, IV placed, and charge nurse advised at [facility] ER.  3:44 PM - report given to EMS with transfer of care.  No orders of the defined types were placed in this encounter.   There are no Patient Instructions on file for this visit.   I personally performed the services described in this documentation, which was scribed in my presence. The recorded information has been reviewed and considered, and addended by me as needed.

## 2014-08-23 NOTE — ED Notes (Signed)
Patient is a transfer from Urgent Care with complaints of  Left pain. Patient able to move extremities full ROM. Patient denies any SOB, n/v/d. Patient states urgent Care wanted to rule out cardiac issues. Patient alert and & o does not appear to be in any distress. Patient does states the left arm feel " sleepy". Patient neurologically intact.

## 2014-08-23 NOTE — ED Notes (Signed)
MD at bedside. 

## 2014-08-23 NOTE — ED Notes (Signed)
Family at bedside. 

## 2014-08-23 NOTE — Patient Instructions (Addendum)
Evaluation in ER today, then follow up with me as needed.   For your knees, still may be due to some wear and tear or patellofemoral syndrome as we discussed last time. Follow-up after your emergency department visit to determine next step. We can refer you to orthopedics if needed or physical therapy may be reasonable as well. Follow-up after emergency room visit for your other symptoms today.

## 2014-08-23 NOTE — Discharge Instructions (Signed)

## 2014-08-28 ENCOUNTER — Telehealth: Payer: Self-pay

## 2014-08-28 ENCOUNTER — Ambulatory Visit (INDEPENDENT_AMBULATORY_CARE_PROVIDER_SITE_OTHER): Payer: Managed Care, Other (non HMO) | Admitting: Family Medicine

## 2014-08-28 ENCOUNTER — Ambulatory Visit (INDEPENDENT_AMBULATORY_CARE_PROVIDER_SITE_OTHER): Payer: Managed Care, Other (non HMO)

## 2014-08-28 VITALS — BP 120/90 | HR 45 | Temp 97.7°F | Ht >= 80 in | Wt 242.5 lb

## 2014-08-28 DIAGNOSIS — M25561 Pain in right knee: Secondary | ICD-10-CM

## 2014-08-28 DIAGNOSIS — M25562 Pain in left knee: Secondary | ICD-10-CM

## 2014-08-28 DIAGNOSIS — M5410 Radiculopathy, site unspecified: Secondary | ICD-10-CM | POA: Diagnosis not present

## 2014-08-28 DIAGNOSIS — M792 Neuralgia and neuritis, unspecified: Secondary | ICD-10-CM | POA: Diagnosis not present

## 2014-08-28 NOTE — Telephone Encounter (Signed)
Referral placed.

## 2014-08-28 NOTE — Telephone Encounter (Addendum)
Pt states he is ready to be referred to an [facility], states Dr Carlota Raspberry was going to give him the name of one. Please call [phone]

## 2014-08-28 NOTE — Telephone Encounter (Signed)
Pt called in to check on referral and to let Dr Carlota Raspberry know he was still having issues. When I put message in I seen that the referral had been placed so I called him back and left a message notifying him about this. He can be reached @ [phone]. Thank you

## 2014-08-28 NOTE — Telephone Encounter (Signed)
Mark Gallagher is a 44 y.o. male Knee pain, right - Plan: DG Knee Complete 4 Views Right  Patellofemoral arthralgia of right knee - Plan: DG Knee Complete 4 Views Right  Suspected degenerative joint disease with mild improvement with otc glucosamine. Exam and sx's suggest component of patellofemoral pain. XR report noted - subchondral cyst likely.  -trial of HEP, intermittent otc advil or alleve for now, ok to continue supplement.  - if not improving in next few weeks - ortho eval or MRI as indicated above. Sooner if worse.   No orders of the defined types were placed in this encounter.   Advise on which orthopedist you refer?

## 2014-08-28 NOTE — Progress Notes (Addendum)
Subjective:  This chart was scribed for [name], MD by Thea Alken, ED Scribe. This patient was seen in room 2 and the patient's care was started at 8:37 PM.   Patient ID: Mark Gallagher, male    DOB: [birth year], 44 y.o.   MRN: [id number]  HPI  HPI Comments: Mark Gallagher is a 44 y.o. male who presents to the [facility] complaining of knee pain. See last office visit. Transported to [facility] last visit for numbness and tingling in left arm as well as light headedness. In the ER he had negative troponin. Borderline hemoglobin 11.9 (by istat) compared to 12.8 in office that same day. Suspected nerve irritation at C6 near previous fusion location.  Knee pain  Initially evaluated in May with right knee pain for 6 months. Occasional instability. Suspect DJD with component PFPS a subchondral cyst was noted at the medial tibial plateau. He was having bilateral knee pain when eval 5 fays ago. Minimal improvement with NSAID and home exercise at well at prior OTC glucosamine.  See phone not today. Referral placed to ortho for eval for knee but presents tonight with persistent pain in the knee and left hand  Pt states he feel better but still has some tingling in left wrist and left hand that he rates 3-4/10 He denies CP. He was seen by neurosurgeon, Janell Quiet, the following day and was suspected to have a pinch nerve in neck. He was given medrol dose pack that he states he should finish tomorrow after breakfast. He is also taking oxycodone twice a day, two in the afternoon and 2 at bed time. He denies having HA, light headedness and Dizziness.  Pt reports trouble walking with some knee discomfort, left worse than right. He report improvement with knee pain since last visit  but has noticed occasional tingling in left foot and ankle.He has talked to Dr. Janell Quiet about this and thought pt should see ortho.   He has seen ortho in past several years ago but states physician has possibly  retired.    Patient Active Problem List   Diagnosis Date Noted  . Hypogonadism male 09/08/2013  . 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 Cardiomyopathy 04/01/2011  . Wolff-Parkinson-White (WPW) syndrome 04/01/2011   Past Medical History  Diagnosis Date  . Thyroid disease   . Cancer    No past surgical history on file. No Known Allergies Prior to Admission medications   Medication Sig Start Date End Date Taking? Authorizing Provider  diclofenac (VOLTAREN) 50 MG EC tablet Take by mouth. 08/24/14 08/24/15 Yes Historical Provider, MD  hydrocortisone (CORTEF) 20 MG tablet Take 20 mg by mouth daily.   Yes Historical Provider, MD  levothyroxine (SYNTHROID, LEVOTHROID) 200 MCG tablet Take 200 mcg by mouth daily.   Yes Historical Provider, MD  methylPREDNISolone (MEDROL DOSEPAK) 4 MG TBPK tablet Follow package directions.  Use this medication 1st. 08/24/14  Yes Historical Provider, MD  octreotide (SANDOSTATIN LAR DEPOT) 30 MG injection Inject 30 mg into the muscle every 28 (twenty-eight) days. Patient taking differently: Inject 30 mg into the muscle every 28 (twenty-eight) days. Patient states he had medication 12 days ago from today (08-23-14) 12/11/11  Yes Darlyne Russian, MD  oxyCODONE (OXY IR/ROXICODONE) 5 MG immediate release tablet Take 1 to 2 tabs by mouth every 4 to 6 hours for severe pain. 08/24/14  Yes Historical Provider, MD  testosterone cypionate (DEPOTESTOTERONE CYPIONATE) 200 MG/ML injection Inject 1 mL (200 mg total) into the muscle  every 28 (twenty-eight) days. Patient taking differently: Inject 200 mg into the muscle every 28 (twenty-eight) days. Patient states he had 8 weeks ago from today (08-23-14) 09/08/13  Yes Darlyne Russian, MD   History   Social History  . Marital Status: Single    Spouse Name: N/A  . Number of Children: N/A  . Years of Education: N/A   Occupational History  . Not on file.   Social History Main Topics  . Smoking status: Never Smoker   . Smokeless tobacco: Not on file  .  Alcohol Use: No  . Drug Use: No  . Sexual Activity: Not on file   Other Topics Concern  . Not on file   Social History Narrative   Review of Systems  Respiratory: Negative for chest tightness and shortness of breath.   Cardiovascular: Negative for chest pain.  Musculoskeletal: Positive for arthralgias and gait problem.  Neurological: Negative for dizziness, light-headedness and headaches.       Pt does have left foot, left wrist, and left hand tingling   Objective:   Physical Exam  Constitutional: He is oriented to person, place, and time. He appears well-developed and well-nourished. No distress.  HENT:  Head: Normocephalic and atraumatic.  Eyes: Conjunctivae and EOM are normal.  Neck: Neck supple.  Cardiovascular: Normal rate.   Pulmonary/Chest: Effort normal.  Musculoskeletal: Normal range of motion.  Good ROM of left knee  Neurological: He is alert and oriented to person, place, and time. He displays no Babinski's sign on the right side. He displays no Babinski's sign on the left side.  Reflex Scores:      Patellar reflexes are 2+ on the right side and 2+ on the left side.      Achilles reflexes are 1+ on the right side and 1+ on the left side. He walks with varus gait with intoeing bilaterally.   Skin: Skin is warm and dry.  Psychiatric: He has a normal mood and affect. His behavior is normal.  Nursing note and vitals reviewed.  Filed Vitals:   08/28/14 1954  BP: 120/90  Pulse: 45  Temp: 97.7 F (36.5 C)  TempSrc: Oral  Height: 6\' 8"  (2.032 m)  Weight: 242 lb 8 oz (109.997 kg)  SpO2: 98%    [facility] reading (PRIMARY) by Dr. Carlota Raspberry: L knee: multiple cystic appearing areas/subchondral cysts in proximal tibia and fibular head.   Assessment & Plan:   Mark Gallagher is a 44 y.o. male Left knee pain - Plan: DG Knee Complete 4 Views Left  -Initial bilateral knee pain now worse with left knee pain. Subchondral cysts/degenerative changes likely. However this appears to  be affecting his gait with intoeing and varus component.  This appears to be more prominent than he has had in the past. Discussed crutches, but these were declined at present. If he is having more trouble walking can fit him for some crutches or other walking assistive device if needed.   -Refer to orthopedics for evaluation, may need further imaging with MRI - this can be discussed with ortho.   -He is finishing prednisone taper, then will be on diclofenac, and has oxycodone if needed.  Radicular pain in left arm  -Improving on prednisone. Possible C6 radiculopathy. Has had evaluation with his neurosurgeon, plan for initial treatment with prednisone. Continue follow-up plan with neurosurgeon. If dysesthesias persist, electrodiagnostic testing with nerve conduction studies may be helpful.   Plan on completing out of work paperwork until he is seen  by orthopedic given the nature of his arm and knee symptoms at present and possible sedating component of the medication he's been prescribed.  Meds ordered this encounter  Medications  . oxyCODONE (OXY IR/ROXICODONE) 5 MG immediate release tablet    Sig: Take 1 to 2 tabs by mouth every 4 to 6 hours for severe pain.  . methylPREDNISolone (MEDROL DOSEPAK) 4 MG TBPK tablet    Sig: Follow package directions.  Use this medication 1st.  . diclofenac (VOLTAREN) 50 MG EC tablet    Sig: Take by mouth.   Patient Instructions  Continue the medication as prescribed by Dr. Tommi Rumps for the patient for suspected pinched nerve in the neck. The numbness and tingling in the hand should continue to improve, but if any increase in pain, weakness, or worsening of symptoms return here or call your neurosurgeon.  For your knee pain, I will coordinate continue in to see an orthopedist in the next 10 days.  The medication prescribed by Dr. Domingo Cocking may also help the knees, but will have the orthopedist evaluate to see if we need to do other imaging for your knees.  If you  are having trouble walking or feeling unstable, let me know and we can get you some crutches. I can complete your paperwork for being out of work until evaluated by orthopedic if you are still having difficulty with walking.  Return to the clinic or go to the nearest emergency room if any of your symptoms worsen or new symptoms occur.       I personally performed the services described in this documentation, which was scribed in my presence. The recorded information has been reviewed and considered, and addended by me as needed.

## 2014-08-28 NOTE — Patient Instructions (Signed)
Continue the medication as prescribed by Dr. Tommi Rumps for the patient for suspected pinched nerve in the neck. The numbness and tingling in the hand should continue to improve, but if any increase in pain, weakness, or worsening of symptoms return here or call your neurosurgeon.  For your knee pain, I will coordinate continue in to see an orthopedist in the next 10 days.  The medication prescribed by Dr. Domingo Cocking may also help the knees, but will have the orthopedist evaluate to see if we need to do other imaging for your knees.  If you are having trouble walking or feeling unstable, let me know and we can get you some crutches. I can complete your paperwork for being out of work until evaluated by orthopedic if you are still having difficulty with walking.  Return to the clinic or go to the nearest emergency room if any of your symptoms worsen or new symptoms occur.

## 2014-08-29 ENCOUNTER — Telehealth: Payer: Self-pay | Admitting: Family Medicine

## 2014-08-29 NOTE — Telephone Encounter (Signed)
Patient's employer faxed FMLA forms on 08/28/2014 at 8:12pm. I have placed these forms in Dr. Vonna Kotyk box on 08/29/2014  for completion within 5-7 business days.

## 2014-08-31 ENCOUNTER — Ambulatory Visit (INDEPENDENT_AMBULATORY_CARE_PROVIDER_SITE_OTHER): Payer: Managed Care, Other (non HMO) | Admitting: *Deleted

## 2014-08-31 VITALS — BP 118/90 | Temp 98.0°F

## 2014-08-31 DIAGNOSIS — E291 Testicular hypofunction: Secondary | ICD-10-CM | POA: Diagnosis not present

## 2014-08-31 MED ORDER — TESTOSTERONE CYPIONATE 200 MG/ML IM SOLN
200.0000 mg | Freq: Once | INTRAMUSCULAR | Status: AC
  Administered 2014-08-31: 200 mg via INTRAMUSCULAR

## 2014-08-31 NOTE — Telephone Encounter (Signed)
Patient receives Testosterone Cypionate 200 mg/ml, dosage is 1.5 ml. He was only given 9ml, called pt to come back in to receive 0.5 ml, was unable to reach patient. Message left for pt to call the office.

## 2014-08-31 NOTE — Progress Notes (Signed)
   Subjective:    Patient ID: Mark Gallagher, male    DOB: 06/11/1970, 44 y.o.   MRN: [id number]  HPI Testosterone Cypionate injection only.    Review of Systems     Objective:   Physical Exam        Assessment & Plan:

## 2014-09-01 ENCOUNTER — Telehealth: Payer: Self-pay | Admitting: *Deleted

## 2014-09-01 NOTE — Telephone Encounter (Signed)
Spoke with patient this morning regarding remaining dosage of Testosterone Cypionate 200mg /ml, he receives 1.5 ml, but was only given 1 ml and still needs to receive 0.5 ml to complete the dosage. Patient stated that"He will be in by or before 11:00 am today". I explained to him that I will be leaving at 12:30 pm.

## 2014-09-04 ENCOUNTER — Telehealth: Payer: Self-pay | Admitting: Family Medicine

## 2014-09-04 NOTE — Telephone Encounter (Signed)
They should be ready for pickup or fax tomorrow afternoon.

## 2014-09-04 NOTE — Telephone Encounter (Signed)
Patient called to check the status of his FMLA paperwork. Patient's forms were put in your box on 08/29/2014. When they are completed please return to FMLA/Disabiilties department and I will fax them.

## 2014-09-05 NOTE — Telephone Encounter (Signed)
Done. Placed in FMLA box.

## 2014-09-06 ENCOUNTER — Telehealth: Payer: Self-pay | Admitting: Family Medicine

## 2014-09-06 NOTE — Telephone Encounter (Signed)
Notified patient that his FMLA forms have been completed and faxed. Patient wants to update Dr. Carlota Raspberry on his situation. He states that his knee is giving him a lot trouble and on a scale of 1-10 his pain is a 7. He states that his knees buckles whenever he walks. He has spoken to an orthopaedic specialist and a neurologist. They have both mentioned knee replacement and spinal surgery in the future for him. Please call patient back if needed at [phone] (H)

## 2014-09-06 NOTE — Telephone Encounter (Signed)
Relative male came to pick up fmla forms

## 2014-09-07 ENCOUNTER — Ambulatory Visit (INDEPENDENT_AMBULATORY_CARE_PROVIDER_SITE_OTHER): Payer: Managed Care, Other (non HMO) | Admitting: *Deleted

## 2014-09-07 DIAGNOSIS — E291 Testicular hypofunction: Secondary | ICD-10-CM | POA: Diagnosis not present

## 2014-09-07 NOTE — Progress Notes (Signed)
   Subjective:    Patient ID: Mark Gallagher, male    DOB: November 17, 1970, 44 y.o.   MRN: [id number]  HPI Pt in for Sandostatin LAR [place]ot 30 mg injection only.    Review of Systems     Objective:   Physical Exam        Assessment & Plan:

## 2014-09-14 DIAGNOSIS — I483 Typical atrial flutter: Secondary | ICD-10-CM | POA: Insufficient documentation

## 2014-09-18 ENCOUNTER — Inpatient Hospital Stay (HOSPITAL_COMMUNITY)
Admission: RE | Admit: 2014-09-18 | Discharge: 2014-10-05 | DRG: 551 | Disposition: A | Discharge status: Home / Self Care | Payer: Managed Care, Other (non HMO) | Source: Other Acute Inpatient Hospital | Attending: Physical Medicine & Rehabilitation | Admitting: Physical Medicine & Rehabilitation

## 2014-09-18 ENCOUNTER — Encounter: Payer: Self-pay | Admitting: *Deleted

## 2014-09-18 ENCOUNTER — Inpatient Hospital Stay: Admit: 2014-09-18 | Payer: Self-pay | Admitting: Physical Medicine & Rehabilitation

## 2014-09-18 ENCOUNTER — Encounter (HOSPITAL_COMMUNITY): Payer: Self-pay | Admitting: Physical Medicine and Rehabilitation

## 2014-09-18 ENCOUNTER — Other Ambulatory Visit (HOSPITAL_COMMUNITY): Payer: Self-pay | Admitting: Physical Medicine and Rehabilitation

## 2014-09-18 DIAGNOSIS — F4321 Adjustment disorder with depressed mood: Secondary | ICD-10-CM | POA: Insufficient documentation

## 2014-09-18 DIAGNOSIS — R1310 Dysphagia, unspecified: Secondary | ICD-10-CM

## 2014-09-18 DIAGNOSIS — E039 Hypothyroidism, unspecified: Secondary | ICD-10-CM

## 2014-09-18 DIAGNOSIS — R1314 Dysphagia, pharyngoesophageal phase: Secondary | ICD-10-CM | POA: Diagnosis not present

## 2014-09-18 DIAGNOSIS — D62 Acute posthemorrhagic anemia: Secondary | ICD-10-CM | POA: Diagnosis not present

## 2014-09-18 DIAGNOSIS — G8250 Quadriplegia, unspecified: Secondary | ICD-10-CM | POA: Diagnosis not present

## 2014-09-18 DIAGNOSIS — B370 Candidal stomatitis: Secondary | ICD-10-CM | POA: Diagnosis not present

## 2014-09-18 DIAGNOSIS — K5900 Constipation, unspecified: Secondary | ICD-10-CM

## 2014-09-18 DIAGNOSIS — M4712 Other spondylosis with myelopathy, cervical region: Principal | ICD-10-CM

## 2014-09-18 DIAGNOSIS — E871 Hypo-osmolality and hyponatremia: Secondary | ICD-10-CM

## 2014-09-18 DIAGNOSIS — Z981 Arthrodesis status: Secondary | ICD-10-CM | POA: Diagnosis not present

## 2014-09-18 DIAGNOSIS — E291 Testicular hypofunction: Secondary | ICD-10-CM

## 2014-09-18 DIAGNOSIS — R609 Edema, unspecified: Secondary | ICD-10-CM

## 2014-09-18 DIAGNOSIS — E2740 Unspecified adrenocortical insufficiency: Secondary | ICD-10-CM | POA: Diagnosis not present

## 2014-09-18 DIAGNOSIS — R0982 Postnasal drip: Secondary | ICD-10-CM

## 2014-09-18 DIAGNOSIS — I471 Supraventricular tachycardia: Secondary | ICD-10-CM

## 2014-09-18 DIAGNOSIS — E220 Acromegaly and pituitary gigantism: Secondary | ICD-10-CM | POA: Diagnosis not present

## 2014-09-18 MED ORDER — HYDROCORTISONE 10 MG PO TABS
10.0000 mg | ORAL_TABLET | Freq: Every evening | ORAL | Status: DC
  Administered 2014-09-18 – 2014-10-04 (×17): 10 mg via ORAL
  Filled 2014-09-18 (×19): qty 1

## 2014-09-18 MED ORDER — ONDANSETRON HCL 4 MG PO TABS
4.0000 mg | ORAL_TABLET | Freq: Four times a day (QID) | ORAL | Status: DC | PRN
  Administered 2014-09-28: 4 mg via ORAL
  Filled 2014-09-18: qty 1

## 2014-09-18 MED ORDER — GUAIFENESIN-DM 100-10 MG/5ML PO SYRP
5.0000 mL | ORAL_SOLUTION | Freq: Four times a day (QID) | ORAL | Status: DC | PRN
  Administered 2014-10-01 – 2014-10-03 (×3): 10 mL via ORAL
  Filled 2014-09-18 (×4): qty 10

## 2014-09-18 MED ORDER — ONDANSETRON HCL 4 MG/2ML IJ SOLN: 4.0000 mg | Freq: Four times a day (QID) | INTRAMUSCULAR | Status: DC | PRN

## 2014-09-18 MED ORDER — BOOST / RESOURCE BREEZE PO LIQD
1.0000 | Freq: Three times a day (TID) | ORAL | Status: DC
  Administered 2014-09-22 – 2014-09-25 (×2): 1 via ORAL

## 2014-09-18 MED ORDER — DIPHENHYDRAMINE HCL 12.5 MG/5ML PO ELIX: 12.5000 mg | ORAL_SOLUTION | Freq: Four times a day (QID) | ORAL | Status: DC | PRN

## 2014-09-18 MED ORDER — TRAMADOL HCL 50 MG PO TABS
50.0000 mg | ORAL_TABLET | Freq: Four times a day (QID) | ORAL | Status: DC | PRN
  Administered 2014-09-18 – 2014-10-04 (×21): 50 mg via ORAL
  Filled 2014-09-18 (×26): qty 1

## 2014-09-18 MED ORDER — ENOXAPARIN SODIUM 40 MG/0.4ML ~~LOC~~ SOLN
40.0000 mg | SUBCUTANEOUS | Status: DC
  Administered 2014-09-18 – 2014-10-04 (×17): 40 mg via SUBCUTANEOUS
  Filled 2014-09-18 (×17): qty 0.4

## 2014-09-18 MED ORDER — LEVOTHYROXINE SODIUM 100 MCG PO TABS
100.0000 ug | ORAL_TABLET | Freq: Every day | ORAL | Status: DC
  Administered 2014-09-19 – 2014-10-05 (×17): 100 ug via ORAL
  Filled 2014-09-18 (×18): qty 1

## 2014-09-18 MED ORDER — HYDROCODONE-ACETAMINOPHEN 7.5-325 MG PO TABS
1.0000 | ORAL_TABLET | ORAL | Status: DC | PRN
  Administered 2014-10-02 – 2014-10-03 (×4): 2 via ORAL
  Filled 2014-09-18 (×4): qty 2
  Filled 2014-09-18: qty 1

## 2014-09-18 MED ORDER — BACITRACIN ZINC 500 UNIT/GM EX OINT
TOPICAL_OINTMENT | Freq: Every day | CUTANEOUS | Status: DC
  Administered 2014-09-19 – 2014-09-23 (×5): via TOPICAL
  Administered 2014-09-24: 1 via TOPICAL
  Administered 2014-09-25 – 2014-09-27 (×3): via TOPICAL
  Filled 2014-09-18: qty 28.35

## 2014-09-18 MED ORDER — TESTOSTERONE CYPIONATE 200 MG/ML IM SOLN
300.0000 mg | Freq: Once | INTRAMUSCULAR | Status: DC
  Filled 2014-09-18: qty 1.5

## 2014-09-18 MED ORDER — TRAZODONE HCL 50 MG PO TABS
25.0000 mg | ORAL_TABLET | Freq: Every evening | ORAL | Status: DC | PRN
  Filled 2014-09-18 (×3): qty 1

## 2014-09-18 MED ORDER — FLEET ENEMA 7-19 GM/118ML RE ENEM
1.0000 | ENEMA | Freq: Once | RECTAL | Status: DC | PRN
Start: 2014-09-18 — End: 2014-10-05

## 2014-09-18 MED ORDER — ACETAMINOPHEN 325 MG PO TABS
325.0000 mg | ORAL_TABLET | ORAL | Status: DC | PRN
  Administered 2014-09-19 – 2014-10-04 (×32): 650 mg via ORAL
  Filled 2014-09-18 (×32): qty 2

## 2014-09-18 MED ORDER — SENNOSIDES-DOCUSATE SODIUM 8.6-50 MG PO TABS
2.0000 | ORAL_TABLET | Freq: Two times a day (BID) | ORAL | Status: DC
  Administered 2014-09-18 – 2014-10-05 (×31): 2 via ORAL
  Filled 2014-09-18 (×34): qty 2

## 2014-09-18 MED ORDER — HYDROCORTISONE 20 MG PO TABS
20.0000 mg | ORAL_TABLET | Freq: Every day | ORAL | Status: DC
  Administered 2014-09-19 – 2014-10-05 (×17): 20 mg via ORAL
  Filled 2014-09-18 (×20): qty 1

## 2014-09-18 MED ORDER — BISACODYL 10 MG RE SUPP
10.0000 mg | Freq: Every day | RECTAL | Status: DC | PRN
  Administered 2014-09-23: 10 mg via RECTAL
  Filled 2014-09-18 (×2): qty 1

## 2014-09-18 MED ORDER — METHOCARBAMOL 500 MG PO TABS: 500.0000 mg | ORAL_TABLET | Freq: Four times a day (QID) | ORAL | Status: DC | PRN

## 2014-09-18 MED ORDER — ALUM & MAG HYDROXIDE-SIMETH 200-200-20 MG/5ML PO SUSP: 30.0000 mL | ORAL | Status: DC | PRN

## 2014-09-18 MED ORDER — HYDROGEN PEROXIDE 3 % EX SOLN
Freq: Every day | CUTANEOUS | Status: DC
  Administered 2014-09-19 – 2014-09-22 (×4): via TOPICAL
  Administered 2014-09-23: 1 via TOPICAL
  Administered 2014-09-24 – 2014-09-27 (×4): via TOPICAL
  Filled 2014-09-18: qty 473

## 2014-09-18 MED ORDER — FAMOTIDINE 20 MG PO TABS
20.0000 mg | ORAL_TABLET | Freq: Two times a day (BID) | ORAL | Status: DC
  Administered 2014-09-18 – 2014-10-05 (×34): 20 mg via ORAL
  Filled 2014-09-18 (×34): qty 1

## 2014-09-18 NOTE — H&P (Signed)
Physical Medicine and Rehabilitation Admission H&P   CC: LUE tingling/pain with weakness. BLE weakness with left foot drop anddifficulty walking.    HPI: This is a 44 year old male with history of acromegaly, s/p pituitary adenoma resection, s/p cervical vertebrectomy C4/5 with C3-6 plating 2007 who started developing progressive pain LUE with tingling left arm, weakness BLE with left foot drop and multiple falls. MRI spine revealed cervical canal stenosis C6/7 with T2 signal in adjacent spinal cord. He was admitted to [facility] on 09/09/14 for C2-T3 decompression and fusion by Dr. Tommi Rumps. Post op treated with one unit PRBC for ABLA. On 08/23, he developed SVT requiring cardioversion and spontaneously converted to NSR. He had developed recurrent SVT with hypotension and underwent cardiac ablation on 08/27 with resolution of arrhthymias. He has had some improvement in LUE as well as BLE strength post op. Therapy ongoing and CIR recommended by MD and rehab team. Notes were evaluated by Rehab CM as well as Rehab MD and patient was deemed to be a good rehab candidate. He was cleared medically for admission today.    Review of Systems  HENT: Negative for hearing loss.  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  Respiratory: Negative for cough, shortness of breath and wheezing.  Cardiovascular: Negative for chest pain and palpitations.  Gastrointestinal: Negative for heartburn, nausea, vomiting, abdominal pain and constipation.  Genitourinary: Negative for urgency and frequency.  Musculoskeletal: Positive for myalgias and back pain.  Neurological: Positive for tingling (left hand and left foot), sensory change, focal weakness (left foot drop with LLE instability for past few weeks. ) and weakness. Negative for dizziness, speech change and headaches.  Psychiatric/Behavioral: Negative for depression. The patient is nervous/anxious and has insomnia.      Past Medical History  Diagnosis Date  . Thyroid disease   . Cancer   . Acromegaly   . Testosterone deficiency   . Knee pain, bilateral   . History of pituitary tumor     Past Surgical History  Procedure Laterality Date  . Cervical disc surgery  2007    Anterior C4/5 vertebrectomy and c3-C6 laminoplasty with plating  . Craniotomy for tumor      pituitary adenoma     Family History  Problem Relation Age of Onset  . High blood pressure Mother   . Acute myelogenous leukemia Brother      Social History: Single and live alone. Works for [employer] and independent till few days PTA. He reports that he has never smoked. He does not have any smokeless tobacco history on file. He reports that he does not drink alcohol or use illicit drugs.    Allergies: No Known Allergies    (Not in a hospital admission)  Home: 2 level home with 6 STE. Has 1/2 bath on 1st level 16 stairs to bedroom.   Functional History: Independent without AD PTA.   Functional Status:  Mobility: Min assist for transfers.  Min assist for ambulating short distance with walker    ADL: Min assist for bathing. Min assist with UB dressing. Mod assist with LB dressing.   Cognition:       There were no vitals taken for this visit. Physical Exam  Nursing note and vitals reviewed. Constitutional: He is oriented to person, place, and time. He appears well-developed and well-nourished. Large frame/head HENT: oral mucosa pink and moist Head: Atraumatic.  Eyes: Conjunctivae and EOM are normal. Pupils are equal, round,  and reactive to light. Right eye exhibits no discharge.  Neck:  Cervical collar in place--appears a little small  Cardiovascular: Normal rate and regular rhythm.  No murmur heard. Respiratory: Effort normal and breath sounds normal. No respiratory distress. He has no wheezes. He exhibits no tenderness.  GI: Soft.  Bowel sounds are normal. He exhibits no distension. There is no tenderness.  Musculoskeletal: He exhibits edema (1+ pedal edema bilaterally).  Ecchymosis left 4 th toe and left lateral malleolus. 1+ edema left foot and left hand.  Neurological: He is alert and oriented to person, place, and time.  RUE: 4+/5 deltoid, bicep, tricep, HI. LUE: 4/5 delt, bicep, tricep, 4-wrist and HI. Positive left pronator drift. Sensation 1+/2 LUE and LLE. RLE: 4- HF, 4/5 KE and ADF/APF. LLE: 3+HF and KE and 1/5 ADF/APF. Sensation 1++/2 left foot to LT and pain.  Able to follow commands without difficulty. Soft voice with oral secretions. Able to manage secretions with difficulty.  Skin: Skin is warm and dry.  Psychiatric: He has a normal mood and affect. His behavior is normal. Judgment and thought content normal.     Lab Results Last 48 Hours    No results found for this or any previous visit (from the past 48 hour(s)).    Imaging Results (Last 48 hours)    No results found.       Medical Problem List and Plan: 1. Functional deficits secondary to C6 SCI with incomplete myelopathy 2. DVT Prophylaxis/Anticoagulation: Pharmaceutical: Lovenox 3. Pain Management: Will continue oxycodone prn.  -complaining of low back pain more recently. Treat as indicated. kpad prn 4. Mood: LCSW to follow for evaluation and support.  5. Neuropsych: This patient is capable of making decisions on his own behalf. 6. Skin/Wound Care: Monitor wound daily. Continue wound care dialy.  7. Fluids/Electrolytes/Nutrition: Monitor I/O. Check lytes in am. Appetite has not been great 8. ABLA: follow up admission labs in the morning 9. Adrenal insufficiency: On cortef bid for supplement. 10 Hypothyroidism: On synthroid.  11. Hypogonadism: Depotestosterone every 8 weeks--follow up with patient regarding next dose  12. 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 To follow up with cardiology after discharge--currently in normal rhythm with  rate control 13. H/o Acromegaly: Treated with Sandostatin? 14. Dysphagia: likely related to surgery/collar---D3 diet initially.  Will hold on formal evaluation at present.      Post Admission Physician Evaluation: 1. Functional deficits secondary to C6 cervical myelopathy s/p decompression. 2. Patient is admitted to receive collaborative, interdisciplinary care between the physiatrist, rehab nursing staff, and therapy team. 3. Patient's level of medical complexity and substantial therapy needs in context of that medical necessity cannot be provided at a lesser intensity of care such as a SNF. 4. Patient has experienced substantial functional loss from his/her baseline which was documented above under the "Functional History" and "Functional Status" headings. Judging by the patient's diagnosis, physical exam, and functional history, the patient has potential for functional progress which will result in measurable gains while on inpatient rehab. These gains will be of substantial and practical use upon discharge in facilitating mobility and self-care at the household level. 5. Physiatrist will provide 24 hour management of medical needs as well as oversight of the therapy plan/treatment and provide guidance as appropriate regarding the interaction of the two. 6. 24 hour rehab nursing will assist with bladder management, bowel management, safety, skin/wound care, disease management, medication administration, pain management and patient education and help integrate therapy concepts, techniques,education, etc. 7. PT will  assess and treat for/with: Lower extremity strength, range of motion, stamina, balance, functional mobility, safety, adaptive techniques and equipment, NMR, surgical precautions, pain mgt, family education. Goals are: mod I. 8. OT will assess and treat for/with: ADL's, functional mobility, safety, upper extremity strength, adaptive techniques and equipment, NMR, pain control,  surgical precautions, family education. Goals are: mod I. Therapy may proceed with showering this patient with collar on only. 9. SLP will assess and treat for/with: n/a. Goals are: n/a---hold for present although he's having some dysphagia for solids. 10. Case Management and Social Worker will assess and treat for psychological issues and discharge planning. 11. Team conference will be held weekly to assess progress toward goals and to determine barriers to discharge. 12. Patient will receive at least 3 hours of therapy per day at least 5 days per week. 13. ELOS: 7-10 days  14. Prognosis: excellent     Meredith Staggers, MD, [facility] 09/18/2014

## 2014-09-18 NOTE — Progress Notes (Signed)
[facility] PMR Admission Coordinator Pre-Admission Assessment  Patient: Mark Gallagher is an 44 y.o., male MRN: [id number] DOB: [birth year] Height: 6'8" Weight: 250  Insurance Information HMO: yes PPO: PCP: IPA: 80/20: OTHER:  PRIMARY: Aetna Policy#: [id number] Subscriber: pt CM Name: Mark Gallagher Phone#: [phone] Fax#: [phone] Pre-Cert#: [id number] Employer: [employer] Benefits: Phone #: [phone] Name: 8/25/16approved 8/29 thru 9/5 when updates are due Eff. Date: 01/20/14 Deduct: $200 Out of Pocket Max: $1800 Life Max: none CIR: 90% SNF: 90% 60 days Outpatient: $35 copay per visit Co-Pay: 60 visits combined Home Health: 100% Co-Pay: no visit limit DME: 90% Co-Pay: 10% Providers: in network  SECONDARY: none  Medicaid Application Date: Case Manager:  Disability Application Date: Case Worker:  Pt on FMLA. His goal is to return to work by 10/15. I have discussed that he will have to be cleared by surgeon Mark Gallagher. 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 He developed weakness in his legs with a foot drop and reports falls. Also noted he had difficulty writing with  his left hand ( left handed). MRI performed which showed cervical canal stenosis at C6/7 with T2 signal in the adjacent spinal cord.  On 8/21 OR with Mark Gallagher and Dr. Marvel Plan for C3-T2 fusion. On 8/23 pt HR up to 204, EKG sinus tach with first degree AV block chronic and non specific T wave changes chronic. 1l bolus NS given, lopressor. Pt asymptomatic. Cardiology consulted. Transferred to ICU for cardioversion and treated with adenosine. Pt returned to sinus rhythm and palced on amiodarone drip . Upon further review by cardiology, pt had a similar episode in 2007 after surgery. 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   On 8/25 around 11 am pt went back into SVT. Pt given adenosine and returned to sinus tachycardia. Cardiac ablation 8/26 Continued with some wide complex QRS and drop in BP pt asymptomatic.8/27 but no further persistent arrhythmia.8/28.Cardiology cleared for discharge.    Patient's medical record from [facility] has been reviewed by the rehabilitation admission coordinator and physician. NIH Stroke scale:n/a Glascow Coma Scale:  Past Medical History  Past Medical History  Diagnosis Date  . Thyroid disease   . 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  Prior Rehab/Hospitalizations Has the patient had major surgery during 100 days prior to admission? No   Current Medications Aspirin Senokot Oxycodone Cortef Levothroid Octreotide inj Depo-testosterone inj  Patients Current Diet: Regular diet with thin liquids  Precautions / Restrictions Precautions Precautions: Cervical Precaution Comments: [place] J cervical immobilizer Restrictions Weight Bearing Restrictions: No   Has  the patient had 2 or more falls  or a fall with injury in the past year?Yes Fell 2 to 3 times the week pta  Prior Activity Level Community (5-7x/wk): patient worked fulltime at [employer] at [employer] [address] until 1 am fulltime for 23 years. Takes nap for 1- 2 hrs before work for always may have to work past 1 am due to work until last flight arrives at [employer]. Usually to bed by 230 am.  Prior Functional Level Self Care: Did the patient need help bathing, dressing, using the toilet or eating? Independent  Indoor Mobility: Did the patient need assistance with walking from room to room (with or without device)? Independent  Stairs: Did the patient need assistance with internal or external stairs (with or without device)? Independent  Functional Cognition: Did the patient need help planning regular tasks such as shopping or remembering to take medications? Independent  Home Assistive Devices / Equipment Home Assistive Devices/Equipment: None  Prior Device Use: Indicate devices/aids used by the patient prior to current illness, exacerbation or injury? None of the above   Prior Functional Level Current Functional Level  Bed Mobility  Independent  Min assist   Transfers  Independent  Min assist   Mobility - Walk/Wheelchair  Independent  Min assist   Upper Body Dressing  Independent  Min assist   Lower Body Dressing  Independent  Mod assist   Grooming  Independent  Min assist   Eating/Drinking  Independent  Min assist   Toilet Transfer  Independent  Min assist   Bladder Continence   continent  continent   Bowel Management  continent  continent   Stair Climbing    Other (not attempted)   Communication  intact  independent   Memory  intact  intact   Cooking/Meal Prep  independent     Housework  independent    Money Management  independent     Driving       0/03 ambulated approximately 60 feet with therapy. Some l knee weakness greater than right. Needed two brief standing rest breaks.  Special needs/care consideration Special Bed bed extender needed for pt 6'8" acromegaly Skin surgical incision Bowel mgmt:continent LBM 8/25 Bladder mgmt:continent Diabetic mgmt n/a  Previous Home Environment Living Arrangements: Alone Lives With: Alone Available Help at Discharge: Family, Available 24 hours/day (parents in their 58s can provide supervision . Sister, Mered) Type of Home: Other(Comment) (townhouse) Home Layout: Two level, Bed/bath upstairs, 1/2 bath on main level Alternate Level Stairs-Rails: Right, Left, Can reach both Alternate Level Stairs-Number of Steps: 14 Home Access: Stairs to enter Entrance Stairs-Rails: None Entrance Stairs-Number of Steps: 5 steps then 1 step in from porch Bathroom Shower/Tub: Tub/shower unit, Architectural technologist: Standard Bathroom Accessibility: Yes How Accessible: Accessible via walker [place]: No  Discharge Living Setting Plans for Discharge Living Setting: Patient's home, Alone Type of Home at Discharge: Other (Comment) (townhome) Discharge Home Layout: Two level, 1/2 bath on main level, Bed/bath upstairs Alternate Level Stairs-Rails: Right, Left, Can reach both Alternate Level Stairs-Number of Steps: 14 Discharge Home Access: Stairs to enter Entrance Stairs-Rails: None Entrance Stairs-Number of Steps: 5 then 1 step from porch in Discharge Bathroom Shower/Tub: Tub/shower unit, Curtain Discharge Bathroom Toilet: Standard Discharge Bathroom Accessibility: Yes How Accessible: Accessible via walker Does the patient have any problems obtaining your medications?: No  Social/Family/Support Systems Patient Roles: Other (Comment) (fulltime employee) Contact Information: Henrene Pastor, sister in [place] Anticipated Caregiver: parents can provide supervision  but Ailene Ravel can do anythng physical. She is a  RN who works weekends as Retail buyer of a retirement commnity in [place]: [phone] Ability/Limitations of Caregiver: parents in their 18s can provide supervision level. Ailene Ravel can provide any physical assist during the week as needed Caregiver Availability: 24/7 Discharge Plan Discussed with Primary Caregiver: Yes Is Caregiver In Agreement with Plan?: Yes Does Caregiver/Family have Issues with Lodging/Transportation while Pt is in Rehab?: No  Goals/Additional Needs Patient/Family Goal for Rehab: Mod I wiht PT and OT to possible some supervision Expected length of stay: ELOS 7-10 days Pt/Family Agrees to Admission and willing to participate: Yes Program Orientation Provided & Reviewed with Pt/Caregiver Including Roles & Responsibilities: Yes  Patient Condition: Patient will benefit from an inpatient acute rehabilitation admission .  Preadmission Screen Completed By: Cleatrice Burke, 09/18/2014 7:21 AM ______________________________________________________________________  Discussed status with Dr. Naaman Plummer on 09/18/2014 at 60 and received telephone approval for admission today.  Admission Coordinator: Cleatrice Burke, time 1106 Date 09/18/2014   Assessment/Plan: Diagnosis: cervical stenosis with myelopathy s/p fusioin 1. Does the need for close, 24 hr/day Medical supervision in concert with the patient's rehab needs make it unreasonable for this patient to be served in a less intensive setting? Yes 2. Co-Morbidities requiring supervision/potential complications: Acromegaly , pituitary adenoma 3. Due to bladder management, bowel management, safety, skin/wound care, disease management, medication administration, pain management and patient education, does the patient require 24 hr/day rehab nursing? Yes 4. Does the patient require coordinated care of a physician, rehab  nurse, PT (1-2 hrs/day, 5 days/week) and OT (1-2 hrs/day, 5 days/week) to address physical and functional deficits in the context of the above medical diagnosis(es)? Yes Addressing deficits in the following areas: balance, endurance, locomotion, strength, transferring, bowel/bladder control, bathing, dressing, feeding and grooming 5. Can the patient actively participate in an intensive therapy program of at least 3 hrs of therapy 5 days a week? Yes 6. The potential for patient to make measurable gains while on inpatient rehab is excellent 7. Anticipated functional outcomes upon discharge from inpatients are: modified independent PT, modified independent OT, n/a SLP 8. Estimated rehab length of stay to reach the above functional goals is: 7-10 days 9. Does the patient have adequate social supports to accommodate these discharge functional goals? Yes 10. Anticipated D/C setting: Home 11. Anticipated post D/C treatments: No Name therapy 12. Overall Rehab/Functional Prognosis: excellent    RECOMMENDATIONS: This patient's condition is appropriate for continued rehabilitative care in the following setting: CIR Patient has agreed to participate in recommended program. Yes Note that insurance prior authorization may be required for reimbursement for recommended care.  Comment: admit to CIR today  Meredith Staggers, MD, [facility] 09/18/2014   Cleatrice Burke 09/18/2014

## 2014-09-18 NOTE — PMR Pre-admission (Signed)
[facility] PMR Admission Coordinator Pre-Admission Assessment  Patient: Mark Gallagher is an 44 y.o., male MRN: [id number] DOB: 30-May-1970 Height:   6'8" Weight:  250  Insurance Information HMO: yes PPO: PCP: IPA: 80/20: OTHER:  PRIMARY: Aetna Policy#: [id number] Subscriber: pt CM Name: [name] Phone#: [phone] Fax#: [phone] Pre-Cert#: [id number] Employer: [employer] Benefits: Phone #: [phone] Name: 8/25/16approved 8/29 thru 9/5 when updates are due Eff. Date: 01/20/14 Deduct: $200 Out of Pocket Max: $1800 Life Max: none CIR: 90% SNF: 90% 60 days Outpatient: $35 copay per visit Co-Pay: 60 visits combined Home Health: 100% Co-Pay: no visit limit DME: 90% Co-Pay: 10% Providers: in network  SECONDARY: none  Medicaid Application Date: Case Manager:  Disability Application Date: Case Worker:  Pt on FMLA. His goal is to return to work by 10/15. I have discussed that he will have to be cleared by surgeon Dr. Tommi Rumps. 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 He developed weakness in his legs with a foot drop and reports falls. Also noted he had difficulty writing with his left hand  ( left handed). MRI performed which showed cervical canal stenosis at C6/7 with T2 signal in the adjacent spinal cord.  On 8/21 OR with Dr. Tommi Rumps and Dr. Marvel Plan for C3-T2 fusion. On 8/23 pt HR up to 204, EKG sinus tach with first degree AV block chronic and non specific T wave changes chronic. 1l bolus NS given, lopressor. Pt asymptomatic. Cardiology consulted. Transferred to ICU for cardioversion and treated with adenosine. Pt returned to sinus rhythm and palced on amiodarone drip . Upon further review by cardiology, pt had a similar episode in 2007 after surgery. 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   On 8/25 around 11 am pt went back into SVT. Pt given adenosine and returned to sinus tachycardia. Cardiac ablation 8/26 Continued with some wide complex QRS and drop in BP pt asymptomatic.8/27 but no further persistent arrhythmia.8/28.Cardiology cleared for discharge.    Patient's medical record from [facility] has been reviewed by the rehabilitation admission coordinator and physician. NIH Stroke scale:n/a Glascow Coma Scale:  Past Medical History  Past Medical History  Diagnosis Date  . Thyroid disease   . 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  Prior Rehab/Hospitalizations Has the patient had major surgery during 100 days prior to admission? No   Current Medications Aspirin Senokot Oxycodone Cortef Levothroid Octreotide inj Depo-testosterone inj  Patients Current Diet: Regular diet with thin liquids  Precautions / Restrictions Precautions Precautions: Cervical Precaution Comments: [place] J cervical immobilizer Restrictions Weight Bearing Restrictions: No   Has the patient had 2 or  more falls or a fall with injury in the past year?Yes Fell 2 to 3 times the week pta  Prior Activity Level Community (5-7x/wk): patient worked fulltime at [employer] at [employer] [address] until 1 am fulltime for 23 years. Takes nap for 1- 2 hrs before work for always may have to work past 1 am due to work until last flight arrives at [employer]. Usually to bed by 230 am.  Prior Functional Level Self Care: Did the patient need help bathing, dressing, using the toilet or eating? Independent  Indoor Mobility: Did the patient need assistance with walking from room to room (with or without device)? Independent  Stairs: Did the patient need assistance with internal or external stairs (with or without device)? Independent  Functional Cognition: Did the patient need help planning regular tasks such as shopping or remembering to take medications? Independent  Home Assistive Devices / Equipment Home Assistive Devices/Equipment: None  Prior Device Use: Indicate devices/aids used by the patient prior to current illness, exacerbation or injury? None of the above   Prior Functional Level Current Functional Level  Bed Mobility  Independent  Min assist   Transfers  Independent  Min assist   Mobility - Walk/Wheelchair  Independent  Min assist   Upper Body Dressing  Independent  Min assist   Lower Body Dressing  Independent  Mod assist   Grooming  Independent  Min assist   Eating/Drinking  Independent  Min assist   Toilet Transfer  Independent  Min assist   Bladder Continence   continent  continent   Bowel Management  continent  continent   Stair Climbing     Other (not attempted)   Communication  intact  independent   Memory  intact  intact   Cooking/Meal Prep  independent     Housework  independent    Money Management  independent    Driving        1/60 ambulated approximately 60 feet with  therapy. Some l knee weakness greater than right. Needed two brief standing rest breaks.  Special needs/care consideration Special Bed bed extender needed for pt 6'8" acromegaly Skin surgical incision Bowel mgmt:continent LBM 8/25 Bladder mgmt:continent Diabetic mgmt n/a  Previous Home Environment Living Arrangements: Alone Lives With: Alone Available Help at Discharge: Family, Available 24 hours/day (parents in their 33s can provide supervision . Sister, Mered) Type of Home: Other(Comment) (townhouse) Home Layout: Two level, Bed/bath upstairs, 1/2 bath on main level Alternate Level Stairs-Rails: Right, Left, Can reach both Alternate Level Stairs-Number of Steps: 14 Home Access: Stairs to enter Entrance Stairs-Rails: None Entrance Stairs-Number of Steps: 5 steps then 1 step in from porch Bathroom Shower/Tub: Tub/shower unit, Architectural technologist: Standard Bathroom Accessibility: Yes How Accessible: Accessible via walker [place]: No  Discharge Living Setting Plans for Discharge Living Setting: Patient's home, Alone Type of Home at Discharge: Other (Comment) (townhome) Discharge Home Layout: Two level, 1/2 bath on main level, Bed/bath upstairs Alternate Level Stairs-Rails: Right, Left, Can reach both Alternate Level Stairs-Number of Steps: 14 Discharge Home Access: Stairs to enter Entrance Stairs-Rails: None Entrance Stairs-Number of Steps: 5 then 1 step from porch in Discharge Bathroom Shower/Tub: Tub/shower unit, Curtain Discharge Bathroom Toilet: Standard Discharge Bathroom Accessibility: Yes How Accessible: Accessible via walker Does the patient have any problems obtaining your medications?: No  Social/Family/Support Systems Patient Roles: Other (Comment) (fulltime employee) Contact Information: Henrene Pastor, sister in [place] Anticipated Caregiver: parents can provide supervision but Ailene Ravel can do anythng physical.  She is a Therapist, sports who works weekends as  Retail buyer of a retirement commnity in [place]: [phone] Ability/Limitations of Caregiver: parents in their 53s can provide supervision level. Ailene Ravel can provide any physical assist during the week as needed Caregiver Availability: 24/7 Discharge Plan Discussed with Primary Caregiver: Yes Is Caregiver In Agreement with Plan?: Yes Does Caregiver/Family have Issues with Lodging/Transportation while Pt is in Rehab?: No  Goals/Additional Needs Patient/Family Goal for Rehab: Mod I wiht PT and OT to possible some supervision Expected length of stay: ELOS 7-10 days Pt/Family Agrees to Admission and willing to participate: Yes Program Orientation Provided & Reviewed with Pt/Caregiver Including Roles & Responsibilities: Yes  Patient Condition: Patient will benefit from an inpatient acute rehabilitation admission .  Preadmission Screen Completed By: Cleatrice Burke, 09/18/2014 7:21 AM ______________________________________________________________________  Discussed status with Dr. Naaman Plummer on 09/18/2014 at 56 and received telephone approval for admission today.  Admission Coordinator: Cleatrice Burke, time 1106 Date 09/18/2014   Assessment/Plan: Diagnosis: cervical stenosis with myelopathy s/p fusioin 1. Does the need for close, 24 hr/day Medical supervision in concert with the patient's rehab needs make it unreasonable for this patient to be served in a less intensive setting? Yes 2. Co-Morbidities requiring supervision/potential complications: Acromegaly , pituitary adenoma 3. Due to bladder management, bowel management, safety, skin/wound care, disease management, medication administration, pain management and patient education, does the patient require 24 hr/day rehab nursing? Yes 4. Does the patient require coordinated care of a physician, rehab nurse, PT (1-2 hrs/day, 5 days/week) and OT (1-2 hrs/day, 5 days/week)  to address physical and functional deficits in the context of the above medical diagnosis(es)? Yes Addressing deficits in the following areas: balance, endurance, locomotion, strength, transferring, bowel/bladder control, bathing, dressing, feeding and grooming 5. Can the patient actively participate in an intensive therapy program of at least 3 hrs of therapy 5 days a week? Yes 6. The potential for patient to make measurable gains while on inpatient rehab is excellent 7. Anticipated functional outcomes upon discharge from inpatients are: modified independent PT, modified independent OT, n/a SLP 8. Estimated rehab length of stay to reach the above functional goals is: 7-10 days 9. Does the patient have adequate social supports to accommodate these discharge functional goals? Yes 10. Anticipated D/C setting: Home 11. Anticipated post D/C treatments: [place] therapy 12. Overall Rehab/Functional Prognosis: excellent    RECOMMENDATIONS: This patient's condition is appropriate for continued rehabilitative care in the following setting: CIR Patient has agreed to participate in recommended program. Yes Note that insurance prior authorization may be required for reimbursement for recommended care.  Comment: admit to CIR today  Meredith Staggers, MD, [facility] 09/18/2014   Cleatrice Burke 09/18/2014

## 2014-09-18 NOTE — PMR Pre-admission (Deleted)
[facility] PMR Admission Coordinator Pre-Admission Assessment  Patient: Mark Gallagher is an 44 y.o., male MRN: [id number] DOB: [birth year] Height:   6'8" Weight:  250  Insurance Information HMO: yes    PPO:      PCP:      IPA:      80/20:      OTHER:  PRIMARY: [employer]      Policy#: [id number]      Subscriber: pt CM Name: [name]      Phone#: [phone]     Fax#: [phone] Pre-Cert#: [id number]      Employer: [employer] Benefits:  Phone #: [phone] Name: 8/25/16approved 8/29 thru 9/5 when updates are due Eff. Date: 01/20/14     Deduct: $200      Out of Pocket Max: $1800      Life Max: none CIR: 90%      SNF: 90% 60 days Outpatient: $35 copay per visit     Co-Pay: 60 visits combined Home Health: 100%      Co-Pay: no visit limit DME: 90%     Co-Pay: 10% Providers: in network  SECONDARY: none       Medicaid Application Date:       Case Manager:  Disability Application Date:       Case Worker:  Pt on FMLA. His goal is to return to work by 10/15. I have discussed that he will have to be cleared by surgeon Dr. Tommi Rumps. 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 He developed weakness in his legs with a foot drop and reports falls. Also noted he had difficulty writing with his left hand ( left  handed). MRI performed which showed cervical canal stenosis at C6/7 with T2 signal in the adjacent spinal cord.  On 8/21 OR with Dr. Tommi Rumps and Dr. Marvel Plan for C3-T2 fusion. On 8/23 pt HR up to 204, EKG sinus tach with first degree AV block chronic and non specific T wave changes chronic. 1l bolus NS given, lopressor. Pt asymptomatic. Cardiology consulted. Transferred to ICU for cardioversion and treated with adenosine. Pt returned to sinus rhythm and palced on amiodarone drip . Upon further review by cardiology, pt had a similar episode in 2007 after surgery. 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   On 8/25 around 11 am pt went back into SVT. Pt given adenosine and returned to sinus tachycardia. Cardiac ablation 8/26  Continued  with some wide complex QRS and drop in BP pt asymptomatic.8/27 but no further persistent arrhythmia.8/28.Cardiology cleared for discharge.    Patient's medical record from [facility] has been reviewed by the rehabilitation admission coordinator and physician. NIH Stroke scale:n/a Glascow Coma Scale:  Past Medical History  Past Medical History  Diagnosis Date  . Thyroid disease   . Cancer  Acromegaly diagnosed 1994 Pituitary tumor Arm pain Bilateral knee pain Stenosis cervical spine C3-6 anterior and posterior cervical fusion 2007 Craniotomy for resection of pituitary adenoma  Family History   family history is not on file.  Prior Rehab/Hospitalizations Has the patient had major surgery during 100 days prior to admission? No    Current Medications Aspirin Senokot Oxycodone Cortef Levothroid Octreotide inj Depo-testosterone inj  Patients Current Diet:  Regular diet with thin liquids  Precautions / Restrictions Precautions Precautions: Cervical Precaution Comments: [place] J cervical immobilizer Restrictions Weight Bearing Restrictions: No   Has the patient had 2 or more falls or a  fall with injury in the past year?Yes Fell 2 to 3 times the week pta  Prior Activity Level Community (5-7x/wk): patient worked fulltime at [employer] at [employer] [address] until 1 am fulltime for 23 years. Takes nap for 1- 2 hrs before work for always may have to work past 1 am due to work until last flight arrives at [employer]. Usually to bed by 230 am.  Prior Functional Level Self Care: Did the patient need help bathing, dressing, using the toilet or eating?  Independent  Indoor Mobility: Did the patient need assistance with walking from room to room (with or without device)? Independent  Stairs: Did the patient need assistance with internal or external stairs (with or without device)? Independent  Functional Cognition: Did the patient need help planning regular tasks such as shopping or remembering to take medications? Independent  Home Assistive Devices / Equipment Home Assistive Devices/Equipment: None  Prior Device Use: Indicate devices/aids used by the patient prior to current illness, exacerbation or injury? None of the above   Prior Functional Level Current Functional Level  Bed Mobility  Independent  Min assist   Transfers  Independent  Min assist   Mobility - Walk/Wheelchair  Independent  Min assist   Upper Body Dressing  Independent  Min assist   Lower Body Dressing  Independent  Mod assist   Grooming  Independent  Min assist   Eating/Drinking  Independent  Min assist   Toilet Transfer  Independent  Min assist   Bladder Continence   continent  continent   Bowel Management  continent  continent   Stair Climbing     Other (not attempted)   Communication  intact  independent   Memory  intact  intact   Cooking/Meal Prep  independent      Housework  independent    Money Management  independent    Driving        3/74 ambulated approximately 60 feet with therapy. Some l knee weakness greater than right. Needed two brief standing  rest breaks.  Special needs/care consideration Special Bed bed extender needed for pt 6'8" acromegaly Skin surgical incision Bowel mgmt:continent LBM 8/25 Bladder mgmt:continent Diabetic mgmt n/a  Previous Home Environment Living Arrangements: Alone  Lives With: Alone Available Help at Discharge: Family, Available 24 hours/day (parents in their 10s can provide supervision . Sister, Mered) Type of Home: Other(Comment) (townhouse) Home Layout: Two level, Bed/bath upstairs, 1/2 bath on main level Alternate Level Stairs-Rails: Right, Left, Can reach both Alternate Level Stairs-Number of Steps: 14 Home Access: Stairs to enter Entrance Stairs-Rails: None Entrance Stairs-Number of Steps: 5 steps then 1 step in from porch Bathroom Shower/Tub: Tub/shower unit, Architectural technologist: Standard Bathroom Accessibility: Yes How Accessible: Accessible via walker [place]: No  Discharge Living Setting Plans for Discharge Living Setting: Patient's home, Alone Type of Home at  Discharge: Other (Comment) (townhome) Discharge Home Layout: Two level, 1/2 bath on main level, Bed/bath upstairs Alternate Level Stairs-Rails: Right, Left, Can reach both Alternate Level Stairs-Number of Steps: 14 Discharge Home Access: Stairs to enter Entrance Stairs-Rails: None Entrance Stairs-Number of Steps: 5 then 1 step from porch in Discharge Bathroom Shower/Tub: Tub/shower unit, Curtain Discharge Bathroom Toilet: Standard Discharge Bathroom Accessibility: Yes How Accessible: Accessible via walker Does the patient have any problems obtaining your medications?: No  Social/Family/Support Systems Patient Roles: Other (Comment) (fulltime employee) Contact Information: Henrene Pastor, sister in [place] Anticipated Caregiver: parents can provide supervision but Ailene Ravel can do anythng physical. She is a Therapist, sports who works weekends as Retail buyer of a retirement commnity in [place]: [phone] Ability/Limitations of Caregiver: parents in their 70s can provide supervision level. Ailene Ravel can provide any physical assist during the week as needed Caregiver Availability: 24/7 Discharge Plan Discussed with Primary Caregiver: Yes Is Caregiver In Agreement with Plan?: Yes Does Caregiver/Family have Issues with Lodging/Transportation while Pt is in Rehab?: No  Goals/Additional Needs Patient/Family Goal for Rehab: Mod I wiht PT and OT to possible some supervision Expected length of stay: ELOS 7-10 days Pt/Family Agrees to Admission and willing to participate: Yes Program Orientation Provided & Reviewed with Pt/Caregiver Including Roles  & Responsibilities: Yes  Patient Condition: Patient will benefit from an inpatient acute rehabilitation admission .  Preadmission Screen Completed By:  Cleatrice Burke, 09/18/2014 7:21 AM ______________________________________________________________________   Discussed status with Dr. Naaman Plummer  on  09/18/2014 at 80 and received telephone approval for admission today.  Admission Coordinator:  Cleatrice Burke, time 1106 Date 09/18/2014   Assessment/Plan: Diagnosis: cervical stenosis with myelopathy s/p fusioin 1. Does the need for close, 24 hr/day  Medical supervision in concert with the patient's rehab needs make it unreasonable for this patient to be served in a less intensive setting? Yes 2. Co-Morbidities requiring supervision/potential complications:  Acromegaly , pituitary adenoma 3. Due to bladder management, bowel management, safety, skin/wound care, disease management, medication administration, pain management and patient education, does the patient require 24 hr/day rehab nursing? Yes 4. Does the patient require coordinated care of a physician, rehab nurse, PT (1-2 hrs/day, 5 days/week) and OT (1-2 hrs/day, 5 days/week) to address physical and functional deficits in the context of the  above medical diagnosis(es)? Yes Addressing deficits in the following areas: balance, endurance, locomotion, strength, transferring, bowel/bladder control, bathing, dressing, feeding and grooming 5. Can the patient actively participate in an intensive therapy program of at least 3 hrs of therapy 5 days a week? Yes 6. The potential for patient to make measurable gains while on inpatient rehab is excellent 7. Anticipated functional outcomes upon discharge from inpatients are: modified independent PT, modified independent OT, n/a SLP 8. Estimated rehab length of stay to reach the above functional goals is: 7-10 days 9. Does the patient have adequate social supports to accommodate these discharge functional goals? Yes 10. Anticipated D/C setting: Home 11. Anticipated post D/C treatments: [place] therapy 12. Overall Rehab/Functional Prognosis: excellent    RECOMMENDATIONS: This patient's condition is appropriate for continued rehabilitative care in the following setting: CIR Patient has agreed to participate in recommended program. Yes Note that insurance prior authorization may be required for reimbursement for recommended care.  Comment: admit to CIR today  Meredith Staggers, MD, [place] 09/18/2014   Cleatrice Burke 09/18/2014

## 2014-09-19 ENCOUNTER — Inpatient Hospital Stay (HOSPITAL_COMMUNITY): Payer: Managed Care, Other (non HMO) | Admitting: Occupational Therapy

## 2014-09-19 ENCOUNTER — Inpatient Hospital Stay (HOSPITAL_COMMUNITY): Payer: Managed Care, Other (non HMO) | Admitting: Physical Therapy

## 2014-09-19 DIAGNOSIS — G8250 Quadriplegia, unspecified: Secondary | ICD-10-CM

## 2014-09-19 DIAGNOSIS — D62 Acute posthemorrhagic anemia: Secondary | ICD-10-CM

## 2014-09-19 LAB — CBC WITH DIFFERENTIAL/PLATELET
Basophils Absolute: 0 10*3/uL (ref 0.0–0.1)
Basophils Relative: 1 % (ref 0–1)
Eosinophils Absolute: 0.1 10*3/uL (ref 0.0–0.7)
Eosinophils Relative: 2 % (ref 0–5)
HCT: 29.4 % — ABNORMAL LOW (ref 39.0–52.0)
Hemoglobin: 9.6 g/dL — ABNORMAL LOW (ref 13.0–17.0)
Lymphocytes Relative: 20 % (ref 12–46)
Lymphs Abs: 1.2 10*3/uL (ref 0.7–4.0)
MCH: 31 pg (ref 26.0–34.0)
MCHC: 32.7 g/dL (ref 30.0–36.0)
MCV: 94.8 fL (ref 78.0–100.0)
Monocytes Absolute: 0.5 10*3/uL (ref 0.1–1.0)
Monocytes Relative: 8 % (ref 3–12)
Neutro Abs: 4.3 10*3/uL (ref 1.7–7.7)
Neutrophils Relative %: 69 % (ref 43–77)
Platelets: 207 10*3/uL (ref 150–400)
RBC: 3.1 MIL/uL — ABNORMAL LOW (ref 4.22–5.81)
RDW: 13.9 % (ref 11.5–15.5)
WBC: 6.2 10*3/uL (ref 4.0–10.5)

## 2014-09-19 LAB — COMPREHENSIVE METABOLIC PANEL
ALT: 29 U/L (ref 17–63)
AST: 10 U/L — ABNORMAL LOW (ref 15–41)
Albumin: 2.6 g/dL — ABNORMAL LOW (ref 3.5–5.0)
Alkaline Phosphatase: 92 U/L (ref 38–126)
Anion gap: 6 (ref 5–15)
BUN: 5 mg/dL — ABNORMAL LOW (ref 6–20)
CO2: 29 mmol/L (ref 22–32)
Calcium: 8.4 mg/dL — ABNORMAL LOW (ref 8.9–10.3)
Chloride: 98 mmol/L — ABNORMAL LOW (ref 101–111)
Creatinine, Ser: 0.58 mg/dL — ABNORMAL LOW (ref 0.61–1.24)
GFR calc Af Amer: >60 mL/min (ref >60)
GFR calc non Af Amer: >60 mL/min (ref >60)
Glucose, Bld: 105 mg/dL — ABNORMAL HIGH (ref 65–99)
Potassium: 3.7 mmol/L (ref 3.5–5.1)
Sodium: 133 mmol/L — ABNORMAL LOW (ref 135–145)
Total Bilirubin: 1.2 mg/dL (ref 0.3–1.2)
Total Protein: 5 g/dL — ABNORMAL LOW (ref 6.5–8.1)

## 2014-09-19 MED ORDER — FERROUS SULFATE 325 (65 FE) MG PO TABS
325.0000 mg | ORAL_TABLET | Freq: Two times a day (BID) | ORAL | Status: DC
  Administered 2014-09-19 – 2014-10-05 (×33): 325 mg via ORAL
  Filled 2014-09-19 (×32): qty 1

## 2014-09-19 NOTE — Evaluation (Signed)
Physical Therapy Assessment and Plan  Patient Details  Name: Mark Gallagher MRN: [id number] Date of Birth: [birth year]  PT Diagnosis: Abnormality of gait, Cognitive deficits, Difficulty walking, Edema, Impaired sensation, Muscle weakness, Pain in neck and Quadriplegia Rehab Potential: Good ELOS: 2.5-3 weeks   Today's Date: 09/19/2014 PT Individual Time: 0900-1044 PT Individual Time Calculation (min): 104 min    Problem List:  Patient Active Problem List   Diagnosis Date Noted  . Tetraplegia 09/19/2014  . Acute blood loss anemia 09/19/2014  . Cervical spondylosis with myelopathy 09/18/2014  . Radiculopathy of arm 08/28/2014  . Hypogonadism male 09/08/2013  . 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 Cardiomyopathy 04/01/2011  . Wolff-Parkinson-White (WPW) syndrome 04/01/2011    Past Medical History:  Past Medical History  Diagnosis Date  . Thyroid disease   . Cancer   . Acromegaly   . Testosterone deficiency   . Knee pain, bilateral   . History of pituitary tumor    Past Surgical History:  Past Surgical History  Procedure Laterality Date  . Cervical disc surgery  2007    Anterior C4/5 vertebrectomy and c3-C6 laminoplasty with plating  . Craniotomy for tumor      pituitary adenoma    Assessment & Plan Clinical Impression: Patient is a 44 year old male with history of acromegaly, s/p pituitary adenoma resection, s/p cervical vertebrectomy C4/5 with C3-6 plating 2007 who started developing progressive pain LUE with tingling left arm, weakness BLE with left foot drop and multiple falls. MRI spine revealed cervical canal stenosis C6/7 with T2 signal in adjacent spinal cord. He was admitted to [facility] on 09/09/14 for C2-T3 decompression and fusion by Dr. Tommi Rumps. Post op treated with one unit PRBC for ABLA. On 08/23, he developed SVT requiring cardioversion and spontaneously converted to NSR. He had developed recurrent SVT with hypotension and underwent cardiac ablation on 08/27 with resolution  of arrhthymias.  Patient transferred to CIR on 09/18/2014 .   Patient currently requires max with mobility secondary to muscle weakness, impaired timing and sequencing, decreased coordination and decreased motor planning, decreased attention and decreased problem solving and decreased standing balance, decreased postural control and decreased balance strategies.  Prior to hospitalization, patient was independent  with mobility and lived with Alone in a Other(Comment) (pt lives in town home but will D/C to parent's home) home.  Home access is curb + 4STE + 1 from porch to enter his town home; 3 STE parent's homeStairs to enter.  Patient will benefit from skilled PT intervention to maximize safe functional mobility, minimize fall risk and decrease caregiver burden for planned discharge home with intermittent assist.  Anticipate patient will benefit from follow up [facility] at discharge.  PT - End of Session Activity Tolerance: Tolerates 30+ min activity with multiple rests Endurance Deficit: No PT Assessment Rehab Potential (ACUTE/IP ONLY): Good Barriers to Discharge: Decreased caregiver support (parents can only provide supervision-min A) PT Patient demonstrates impairments in the following area(s): Balance;Edema;Motor;Pain;Sensory;Other (comment) (cognition/sequencing) PT Transfers Functional Problem(s): Bed Mobility;Bed to Chair;Car;Furniture PT Locomotion Functional Problem(s): Ambulation;Wheelchair Mobility;Stairs PT Plan PT Intensity: Minimum of 1-2 x/day ,45 to 90 minutes PT Frequency: 5 out of 7 days PT Duration Estimated Length of Stay: 2.5-3 weeks PT Treatment/Interventions: Ambulation/gait training;Balance/vestibular training;Cognitive remediation/compensation;Community reintegration;Discharge planning;DME/adaptive equipment instruction;Functional mobility training;Neuromuscular re-education;Pain management;Patient/family education;Splinting/orthotics;Stair training;Psychosocial  support;Therapeutic Activities;Therapeutic Exercise;UE/LE Strength taining/ROM;UE/LE Coordination activities;Wheelchair propulsion/positioning PT Transfers Anticipated Outcome(s): Supervision PT Locomotion Anticipated Outcome(s): Min A PT Recommendation Recommendations for Other Services: Speech consult;Neuropsych consult Follow Up Recommendations: Home health PT  Patient destination: Home Equipment Recommended: Rolling walker with 5" wheels;Wheelchair cushion (measurements);Wheelchair (measurements)  Skilled Therapeutic Intervention Pt participated in therapy evaluation.  Performed w/c mobility in regular manual w/c, gait assessment and stair negotiation, and car transfer as below.  Due to patient's height and need for back support pt changed to reclining w/c for increased back support.  W/c fit with ELR due to edema in bilat LE and assisted with donning KHT.  Educated pt on exercises, positioning and use of taping for edema management.  At end of session discussed goals, LOS and focus of therapy with pt.  Pt verbalized agreement.  At end of session pt left in reclining w/c for OT session.   PT Evaluation Precautions/Restrictions Precautions Precautions: Cervical Precaution Comments: [place] cervical immobilizer Required Braces or Orthoses: Cervical Brace Cervical Brace: Hard collar;At all times Restrictions Weight Bearing Restrictions: No General Chart Reviewed: Yes Response to Previous Treatment: Patient with no complaints from previous session. Family/Caregiver Present: No Vital Signs  Pain Pain Assessment Pain Score: 3  Pain Type: Acute pain Pain Location: Neck Pain Descriptors / Indicators: Sore Pain Intervention(s): Heat applied Home Living/Prior Functioning Home Living Available Help at Discharge: Family;Available 24 hours/day Type of Home: Other(Comment) (pt lives in town home but will D/C to parent's home) Home Access: Stairs to enter [employer] of Steps:  curb + 4STE + 1 from porch to enter his town home; 3 STE parent's home Entrance Stairs-Rails: None (father in process of installing rails) Home Layout: Two level;Bed/bath upstairs;Other (Comment) (town home is two levels; can live on main level at parent's) Alternate Level Stairs-Number of Steps: 14 Alternate Level Stairs-Rails: Right;Left;Can reach both Bathroom Shower/Tub: Tub/shower unit;Curtain Bathroom Toilet: Standard Bathroom Accessibility: Yes Additional Comments: Parents can only provide supervision-min A  Lives With: Alone Prior Function Level of Independence: Independent with homemaking with ambulation;Independent with gait;Independent with transfers  Able to Take Stairs?: Yes Driving: Yes Vocation: Full time employment Vocation Requirements: Works 5pm-1am at Production assistant, radio at [employer] [place] Vision/Perception  Vision - Assessment Additional Comments: Pt voiced no change from baseline pt with irregular eye allignment at baseline. Did not wear corrective lenses PTA.   Cognition Orientation Level: Oriented X4 Sensation Sensation Light Touch: Impaired Detail Light Touch Impaired Details: Impaired LUE;Impaired LLE;Impaired RLE Stereognosis: Not tested Hot/Cold: Not tested Proprioception: Impaired by gross assessment Coordination Gross Motor Movements are Fluid and Coordinated: No Fine Motor Movements are Fluid and Coordinated: No Finger Nose Finger Test: Decreased accuracy and speed with R UE; WFL L UE Motor  Motor Motor: Tetraplegia;Abnormal postural alignment and control Motor - Skilled Clinical Observations: UE and LE weakness L>R; impaired postural control during transfers and standing-posterior bias.  Impaired motor planning/sequencing  Mobility   Locomotion  Gait Gait: Yes Gait Pattern: Impaired Gait Pattern: Step-through pattern;Decreased dorsiflexion - left;Left genu recurvatum;Poor foot clearance - left  Trunk/Postural Assessment  Cervical  Assessment Cervical Assessment: Exceptions to [facility] (c-collar) Thoracic Assessment Thoracic Assessment: Within Functional Limits Lumbar Assessment Lumbar Assessment: Exceptions to [facility] (posterior pelvic tilt) Postural Control Postural Control: Deficits on evaluation (posterior lean/bias)  Balance Balance Balance Assessed: Yes Static Sitting Balance Static Sitting - Balance Support: Feet supported;Right upper extremity supported;Left upper extremity supported Static Sitting - Level of Assistance: 6: Modified independent (Device/Increase time) Static Sitting - Comment/# of Minutes: Sitting EOB to eat breakfast Dynamic Sitting Balance Dynamic Sitting - Balance Support: Feet supported;Right upper extremity supported;Left upper extremity supported Dynamic Sitting - Level of Assistance: 4: Min assist Dynamic Sitting  Balance - Compensations: B UE supprt Dynamic Sitting - Balance Activities: Lateral lean/weight shifting;Forward lean/weight shifting;Reaching across midline Sitting balance - Comments: Sitting EOB to dress Static Standing Balance Static Standing - Balance Support: Right upper extremity supported;Left upper extremity supported Static Standing - Level of Assistance: 3: Mod assist Static Standing - Comment/# of Minutes: Standing to complete LB dressing Dynamic Standing Balance Dynamic Standing - Balance Support: Right upper extremity supported;Left upper extremity supported Dynamic Standing - Level of Assistance: 2: Max assist;1: +2 Total assist Dynamic Standing - Balance Activities: Reaching for objects;Lateral lean/weight shifting;Forward lean/weight shifting Dynamic Standing - Comments: Sitting EOB to dress Extremity Assessment  RUE Assessment RUE Assessment: Within Functional Limits (5/5 strength) LUE Assessment LUE Assessment: Exceptions to WFL LUE AROM (degrees) Overall AROM Left Upper Extremity: Due to pain;Deficits;Other (comment) (Able to achieve ~170 degrees shoulder  flexion, voiced increased pain with overhead movement. ) LUE Strength LUE Overall Strength: Deficits (4/5 overall; decreased grip strength. ) RLE Assessment RLE Assessment: Within Functional Limits (intact strength; poor motor planning) LLE Assessment LLE Assessment: Exceptions to [facility] LLE Strength LLE Overall Strength: Deficits LLE Overall Strength Comments: 3/5 overall  Function: Toileting Toileting             Bed Mobility Roll left and right activity        Sit to lying activity   Assist level: Touching or steadying assistance (Pt > 75%, lift 1 leg)    Lying to sitting activity   Assist level: Moderate assist (Pt 50 - 74%, lift 2 legs) Lying to sitting assistive device: Bedrails  Mobility details     Transfers Sit to stand transfer   Sit to stand assist level: Moderate assist (Pt 50 - 74%/lift 2 legs/lift or lower) Sit to stand assistive device: Armrests;Walker  Chair/bed transfer   Chair/bed transfer method: Stand pivot Chair/bed transfer assist level: Maximal assist (Pt 25 - 49%/lift and lower) Chair/bed transfer assistive device: Walker;Armrests   Chair/bed transfer details: Tactile cues for initiation;Tactile cues for sequencing;Tactile cues for weight shifting;Tactile cues for posture;Verbal cues for sequencing;Verbal cues for technique;Verbal cues for precautions/safety;Verbal cues for safe use of DME/AE;Manual facilitation for weight shifting;Manual facilitation for weight bearing   Toilet transfer Toilet transfer activity did not occur: Safety/medical concerns              Geneticist, molecular transfer assistive device: Publishing rights manager transfer assist level: Maximal assist (Pt 25 - 49%/lift and lower)    Locomotion Ambulation   Assistive device: Walker-rolling Max distance: 75 Assist level: 2 helpers  Walk 10 feet activity   Assist level: 2 helpers  Walk 50 feet with 2 turns activity   Assist level: 2 helpers  Walk 150 feet activity Walk 150 feet  activity did not occur: Safety/medical concerns (Tetraparesis)    Walk 10 feet on uneven surfaces activity Walk 10 feet on uneven surfaces activity did not occur: Safety/medical concerns    Stairs   Stairs assistive device: 2 hand rails Max number of stairs: 4 Stairs assist level: 2 helpers  Walk up/down 1 step activity     Walk up/down 1 step (curb) assist level: 2 helpers  Walk up/down 4 steps activity   Walk up/down 4 steps assist level: 2 helpers  Walk up/down 12 steps activity Walk up/down 12 steps activity did not occur: Safety/medical concerns (tetraparesis)    Pick up small objects from floor Pick up small object from the floor (from standing position) activity did not occur: Safety/medical concerns (  tetraparesis)    Wheelchair   Type: Manual Max wheelchair distance: 150 Assist Level: Moderate assistance (Pt 50 - 74%)  Wheel 50 feet with 2 turns activity   Assist Level: Moderate assistance (Pt 50 - 74%)  Wheel 150 feet activity   Assist Level: Moderate assistance (Pt 50 - 74%)   Cognition Comprehension Comprehension assist level: Follows complex conversation/direction with extra time/assistive device  Expression Expression assist level: Expresses complex ideas: With extra time/assistive device  Social Interaction Social Interaction assist level: Interacts appropriately with others - No medications needed.  Problem Solving Problem solving assist level: Solves basic 50 - 74% of the time/requires cueing 25 - 49% of the time  Memory Memory assist level: Recognizes or recalls 50 - 74% of the time/requires cueing 25 - 49% of the time    Refer to Care Plan for Long Term Goals  Recommendations for other services: Neuropsych and Other: Speech Therapy  Discharge Criteria: Patient will be discharged from PT if patient refuses treatment 3 consecutive times without medical reason, if treatment goals not met, if there is a change in medical status, if patient makes no progress towards  goals or if patient is discharged from hospital.  The above assessment, treatment plan, treatment alternatives and goals were discussed and mutually agreed upon: by patient  Malachy Mood 09/19/2014, 12:46 PM

## 2014-09-19 NOTE — Evaluation (Signed)
Occupational Therapy Assessment and Plan  Patient Details  Name: Mark Gallagher MRN: [id number] Date of Birth: 1970/10/07  OT Diagnosis: abnormal posture, acute pain, ataxia, cognitive deficits and quadriplegia at level C-6 Rehab Potential: Rehab Potential (ACUTE ONLY): Good ELOS: 18-20 days   Today's Date: 09/19/2014 OT Individual Time: 4034-7425 and 1100-1210 OT Individual Time Calculation (min): 70 min   And 70 min  Problem List:  Patient Active Problem List   Diagnosis Date Noted  . Tetraplegia 09/19/2014  . Acute blood loss anemia 09/19/2014  . Cervical spondylosis with myelopathy 09/18/2014  . Radiculopathy of arm 08/28/2014  . Hypogonadism male 09/08/2013  . 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 Cardiomyopathy 04/01/2011  . Wolff-Parkinson-White (WPW) syndrome 04/01/2011    Past Medical History:  Past Medical History  Diagnosis Date  . Thyroid disease   . Cancer   . Acromegaly   . Testosterone deficiency   . Knee pain, bilateral   . History of pituitary tumor    Past Surgical History:  Past Surgical History  Procedure Laterality Date  . Cervical disc surgery  2007    Anterior C4/5 vertebrectomy and c3-C6 laminoplasty with plating  . Craniotomy for tumor      pituitary adenoma    Assessment & Plan Clinical Impression: This is a 44 year old male with history of acromegaly, s/p pituitary adenoma resection, s/p cervical vertebrectomy C4/5 with C3-6 plating 2007 who started developing progressive pain LUE with tingling left arm, weakness BLE with left foot drop and multiple falls. MRI spine revealed cervical canal stenosis C6/7 with T2 signal in adjacent spinal cord. He was admitted to [facility] on 09/09/14 for C2-T3 decompression and fusion by Dr. Tommi Rumps. Post op treated with one unit PRBC for ABLA. On 08/23, he developed SVT requiring cardioversion and spontaneously converted to NSR. He had developed recurrent SVT with hypotension and underwent cardiac ablation on 08/27 with  resolution of arrhthymias. He has had some improvement in LUE as well as BLE strength post op. Therapy ongoing and CIR recommended by MD and rehab team. Notes were evaluated by Rehab CM as well as Rehab MD and patient was deemed to be a good rehab candidate. Patient transferred to CIR on 09/18/2014 .    Patient currently requires mod with basic self-care skills secondary to muscle weakness and muscle paralysis, impaired timing and sequencing, unbalanced muscle activation, ataxia, decreased coordination and decreased motor planning, decreased problem solving and delayed processing and decreased sitting balance, decreased standing balance, decreased postural control and decreased balance strategies.  Prior to hospitalization, patient could complete ADL/IADLs with independent .  Patient will benefit from skilled intervention to decrease level of assist with basic self-care skills, increase independence with basic self-care skills and increase level of independence with iADL prior to discharge home independently.  Anticipate patient will require 24 hour supervision and minimal physical assistance and follow up home health.      Skilled Therapeutic Intervention Session One: Pt seen for OT eval and ADL re-training session. Pt in supine upon arrival, agreeable to tx session. He transferred to EOB with mod A and VCs for technique. Pt voiced slight dizziness upon upright sitting, however, this disapated following short rest break. Pt declined bathing task this session, opting to complete it at next OT session. He dressed seated on EOB, see below for assist level required. Pt required assist due to decreased ROM in L UEs, LE pain, and decreased flexibity. Pt's height is also limiting his ability to currently complete tasks independently. Pt  set-up with chair to place foot on to assist with donning socks, however, due to increased edema, pt required assist in donning TED hose and socks. Pt able to stand from  elevated bed with RW and mod A while blocking L knee and steadying assist provided while pt pulled up pants. Pt left sitting EOB at end of session with RN present to assist in remaining tasks.  Pt educated throughout session regarding OT goals, POC, CIR, and d/c planning.   Session Two: Pt seen for OT ADL bathing session and for fine motor tasks. Pt sitting up in w/c upon arrival, agreeable to tx session. OT explained to pt that MDs have cleared pt to shower, however, pt very apprehensive regarding showering, wishing instead to complete bathing task seated in w/c at sink. See below for level of assist required. Max A required to stand at sink and steadying assist while completing LB bathing.  Attempted toilet transfer standing at RW > BSC, however, due to decreased LE strength and ataxic movements, pt unable to complete sit > stand transfer from w/c level. Attempted to do squat pivot transfer, however, pt unable to completely clear butt from surface to complete transfer. Attempted to complete toilet transfer using standard STEDY, however, pt requires use of bariatric STEADY which was not immediately available. Pt used handheld urinal for toileting task during session, requiring assist for clothing management and positioning of urinal. He completed 9 hole peg test, results below. Pt left sitting up in w/c at end of session, set-up with meal tray and all needs in reach.  9 hole peg test R 39 seconds L 4 pegs placed after 5 min.   OT Evaluation Precautions/Restrictions  Precautions Precautions: Cervical Precaution Comments: [place] cervical immobilizer Required Braces or Orthoses: Cervical Brace Cervical Brace: Hard collar;At all times Restrictions Weight Bearing Restrictions: No General Chart Reviewed: Yes Pain Patient voiced discomfort in L shoulder with movement  Home Living/Prior Functioning Home Living Living Arrangements: Alone Available Help at Discharge: Family, Available 24  hours/day Type of Home: Other(Comment) ([place]) Home Access: Stairs to enter Technical brewer of Steps: 4steps to enter house then 1 step in from [employer]:  (Pt's father in the process of building railings. ) Home Layout: Two level, Bed/bath upstairs, 1/2 bath on main level Alternate Level Stairs-Number of Steps: 14 Alternate Level Stairs-Rails: Right, Left, Can reach both Bathroom Shower/Tub: Tub/shower unit, Industrial/product designer: Yes  Lives With: Alone IADL History Homemaking Responsibilities: Yes Meal Prep Responsibility: Primary Laundry Responsibility: Primary Current License: Yes Mode of Transportation: Car Occupation: Full time employment Prior Function Level of Independence: Independent with basic ADLs, Independent with homemaking with ambulation  Able to Take Stairs?: Yes Driving: Yes Vocation: Full time employment Vision/Perception  Vision- History Baseline Vision/History: No visual deficits Patient Visual Report: No change from baseline Vision- Assessment Additional Comments: Pt voiced no change from baseline pt with irregular eye allignment at baseline. Did not wear corrective lenses PTA.   Cognition Overall Cognitive Status: Impaired/Different from baseline Arousal/Alertness: Awake/alert Orientation Level: Person;Place;Situation Person: Oriented Place: Oriented Situation: Oriented Awareness: Appears intact Problem Solving: Impaired Problem Solving Impairment: Functional basic;Verbal basic Executive Function: Sequencing Sequencing: Impaired Sequencing Impairment: Verbal complex;Functional basic;Functional complex Safety/Judgment: Appears intact Sensation Sensation Light Touch: Impaired Detail Light Touch Impaired Details: Impaired LUE;Impaired LLE;Impaired RLE Stereognosis: Not tested Hot/Cold: Not tested Proprioception: Impaired by gross assessment Coordination Gross Motor Movements are  Fluid and Coordinated: No Fine Motor Movements are Fluid and Coordinated:  No Finger Nose Finger Test: Decreased accuracy and speed with R UE; WFL L UE 9 Hole Peg Test: R: 39 seconds L: 4 pegs placed after 5 minutes Motor  Motor Motor: Tetraplegia;Abnormal postural alignment and control Motor - Skilled Clinical Observations: UE and LE weakness L>R; impaired postural control during transfers and standing-posterior bias.  Impaired motor planning/sequencing  Trunk/Postural Assessment  Cervical Assessment Cervical Assessment: Exceptions to [facility] (Cervical hard collar) Thoracic Assessment Thoracic Assessment: Within Functional Limits Lumbar Assessment Lumbar Assessment: Exceptions to [facility] (Posterior pelvic tilt) Postural Control Postural Control: Deficits on evaluation (Posterior Lean)  Balance Balance Balance Assessed: Yes Static Sitting Balance Static Sitting - Balance Support: Feet supported;Right upper extremity supported;Left upper extremity supported Static Sitting - Level of Assistance: 6: Modified independent (Device/Increase time);5: Stand by assistance Static Sitting - Comment/# of Minutes: Sitting EOB to eat breakfast Dynamic Sitting Balance Dynamic Sitting - Balance Support: Feet supported;Right upper extremity supported;Left upper extremity supported Dynamic Sitting - Level of Assistance: 4: Min assist Dynamic Sitting Balance - Compensations: B UE supprt Dynamic Sitting - Balance Activities: Lateral lean/weight shifting;Forward lean/weight shifting;Reaching across midline Sitting balance - Comments: Sitting EOB to dress Static Standing Balance Static Standing - Level of Assistance: 4: Min assist Static Standing - Comment/# of Minutes: Standing to complete LB dressing Dynamic Standing Balance Dynamic Standing - Balance Support: Bilateral upper extremity supported Dynamic Standing - Level of Assistance: 3: Mod assist;4: Min assist Dynamic Standing - Balance Activities: Reaching  for objects;Lateral lean/weight shifting;Forward lean/weight shifting Dynamic Standing - Comments: Sitting EOB to dress Extremity/Trunk Assessment RUE Assessment RUE Assessment: Within Functional Limits (5/5 strength) LUE Assessment LUE Assessment: Exceptions to WFL LUE AROM (degrees) Overall AROM Left Upper Extremity: Due to pain;Deficits;Other (comment) (Able to achieve ~170 degrees shoulder flexion, voiced increased pain with overhead movement. ) LUE Strength LUE Overall Strength: Deficits (4/5 overall; decreased grip strength. )  Function:   Eating Eating   Eating Assist Level: Set up assist for   Eating Set Up Assist For: Applying device (includes dentures)       Grooming Oral Care,Brush Teeth, Clean Dentures Activity:             Wash, Rinse, Dry Face Activity   Assist Level: Set up   Set up : To obtain items  Wash, Rinse, Dry Hands Activity   Assist Level: Set up   Set up : To obtain items  Brush, Comb Hair Activity        Shave Activity Shave activity did not occur: Safety/medical concerns        Apply Makeup Activity Apply makeup activity did not occur: Patient does not wear makeup                                                           Bathing Bathing position   Position: Wheelchair/chair at sink  Bathing parts Body parts bathed by patient: Right arm;Left arm;Chest;Abdomen;Front perineal area;Buttocks;Right upper leg;Left upper leg Body parts bathed by helper: Right lower leg;Left lower leg;Back  Bathing assist         Upper Body Dressing/Undressing Upper body dressing   What is the patient wearing?: Pull over shirt/dress     Pull over shirt/dress - Perfomed by patient: Thread/unthread right sleeve;Thread/unthread left sleeve Pull over shirt/dress - Perfomed by helper: Put  head through opening;Pull shirt over trunk        Upper body assist         Lower Body Dressing/Undressing Lower body dressing   What is the patient wearing?:  Underwear;Pants;Socks;[facility] Underwear - Performed by patient: Thread/unthread right underwear leg;Thread/unthread left underwear leg;Pull underwear up/down   Pants- Performed by patient: Thread/unthread right pants leg;Thread/unthread left pants leg;Pull pants up/down         Socks - Performed by helper: Don/doff right sock;Don/doff left sock           TED Hose - Performed by helper: Don/doff right TED hose;Don/doff left TED hose  Lower body assist         Toileting Toileting   Toileting steps completed by patient: Performs perineal hygiene Toileting steps completed by helper: Adjust clothing prior to toileting;Adjust clothing after toileting    Toileting assist     Bed Mobility Roll left and right activity      Sit to lying activity   Assist level: Touching or steadying assistance (Pt > 75%, lift 1 leg)  Lying to sitting activity   Assist level: Moderate assist (Pt 50 - 74%, lift 2 legs)  Mobility details     Transfers Sit to stand transfer   Sit to stand assist level: Moderate assist (Pt 50 - 74%/lift 2 legs/lift or lower) Sit to stand assistive device: Armrests;Walker  Chair/bed transfer   Chair/bed transfer method: Stand pivot Chair/bed transfer assist level: Maximal assist (Pt 25 - 49%/lift and lower) Chair/bed transfer assistive device: Walker;Armrests   Chair/bed transfer details: Tactile cues for initiation;Tactile cues for sequencing;Tactile cues for weight shifting;Tactile cues for posture;Verbal cues for sequencing;Verbal cues for technique;Verbal cues for precautions/safety;Verbal cues for safe use of DME/AE;Manual facilitation for weight shifting;Manual facilitation for weight bearing  Toilet transfer Toilet transfer activity did not occur: Safety/medical concerns              Tub/shower transfer Tub/shower transfer activity did not occur: Safety/medical concerns           Cognition Comprehension Comprehension assist level: Follows complex  conversation/direction with extra time/assistive device  Expression Expression assist level: Expresses complex ideas: With extra time/assistive device  Social Interaction Social Interaction assist level: Interacts appropriately with others - No medications needed.  Problem Solving Problem solving assist level: Solves basic 50 - 74% of the time/requires cueing 25 - 49% of the time  Memory Memory assist level: Recognizes or recalls 50 - 74% of the time/requires cueing 25 - 49% of the time    Refer to Care Plan for Long Term Goals  Recommendations for other services: Other: Speech Therapy  Discharge Criteria: Patient will be discharged from OT if patient refuses treatment 3 consecutive times without medical reason, if treatment goals not met, if there is a change in medical status, if patient makes no progress towards goals or if patient is discharged from hospital.  The above assessment, treatment plan, treatment alternatives and goals were discussed and mutually agreed upon: by patient  Ernestina Patches 09/19/2014, 12:34 PM

## 2014-09-19 NOTE — Progress Notes (Signed)
[place] PHYSICAL MEDICINE & REHABILITATION     PROGRESS NOTE    Subjective/Complaints: Feels that left arm tingling less but has had swelling in the hand. Generally had a good night.   ROS: Pt denies fever, rash/itching, headache, blurred or double vision, nausea, vomiting, abdominal pain, diarrhea, chest pain, shortness of breath, palpitations, dysuria, dizziness, neck or back pain, bleeding, anxiety, or depression   Objective: Vital Signs: Blood pressure 113/85, pulse 86, temperature 98.4 F (36.9 C), temperature source Oral, resp. rate 18, height 6\' 8"  (2.032 m), weight 125.465 kg (276 lb 9.6 oz), SpO2 96 %. No results found.  Recent Labs  09/19/14 0601  WBC 6.2  HGB 9.6*  HCT 29.4*  PLT 207    Recent Labs  09/19/14 0601  NA 133*  K 3.7  CL 98*  GLUCOSE 105*  BUN 5*  CREATININE 0.58*  CALCIUM 8.4*   CBG (last 3)  No results for input(s): GLUCAP in the last 72 hours.  Wt Readings from Last 3 Encounters:  09/18/14 125.465 kg (276 lb 9.6 oz)  08/28/14 109.997 kg (242 lb 8 oz)  08/23/14 113.399 kg (250 lb)    Physical Exam:  Constitutional: He is oriented to person, place, and time. He appears well-developed and well-nourished. Large frame/head HENT: oral mucosa pink and moist Head: Atraumatic.  Eyes: Conjunctivae and EOM are normal. Pupils are equal, round, and reactive to light. Right eye exhibits no discharge.  Neck:  Cervical collar in place--appears a little small  Cardiovascular: Normal rate and regular rhythm.  No murmur heard. Respiratory: Effort normal and breath sounds normal. No respiratory distress. He has no wheezes. He exhibits no tenderness.  GI: Soft. Bowel sounds are normal. He exhibits no distension. There is no tenderness.  Musculoskeletal: He exhibits edema (1+ pedal edema bilaterally). tracet edema left hand Ecchymosis left 4 th toe and left lateral malleolus. 1+ edema left foot and left hand.  Neurological: He is alert and  oriented to person, place, and time. RUE: 4+/5 deltoid, bicep, tricep, HI. LUE: 4/5 delt, bicep, tricep, 4-wrist and HI. Positive left pronator drift. Sensation 1+/2 LUE and LLE. RLE: 4- HF, 4/5 KE and ADF/APF. LLE: 3+HF and KE and 1/5 ADF/APF. Sensation 1++/2 left foot to LT and pain.  Able to follow commands without difficulty. Soft voice still with oral secretions.  Skin: Skin is warm and dry.  Psychiatric: He has a normal mood and affect. His behavior is normal. Judgment and thought content normal.    Assessment/Plan: 1. Functional deficits secondary to C6 SCI  which require 3+ hours per day of interdisciplinary therapy in a comprehensive inpatient rehab setting. Physiatrist is providing close team supervision and 24 hour management of active medical problems listed below. Physiatrist and rehab team continue to assess barriers to discharge/monitor patient progress toward functional and medical goals.  Function:  Bathing Bathing position      Bathing parts      Bathing assist        Upper Body Dressing/Undressing Upper body dressing                    Upper body assist        Lower Body Dressing/Undressing Lower body dressing                                  Lower body assist        Toileting Toileting  Toileting assist     Transfers Chair/bed Physiological scientist Comprehension    Expression    Social Interaction    Problem Solving    Memory      Medical Problem List and Plan: 1. Functional deficits secondary to C6 SCI with incomplete myelopathy 2. DVT Prophylaxis/Anticoagulation: Pharmaceutical: Lovenox indicated 3. Pain Management: Will continue oxycodone prn.  -complaining of low back pain more recently.   kpad prn 4. Mood: LCSW to follow for evaluation and support.  5. Neuropsych: This patient is capable of making decisions on his own  behalf. 6. Skin/Wound Care: Monitor wound daily. Continue wound care dialy.  7. Fluids/Electrolytes/Nutrition: Monitor I/O. Encourage po. Sodium sl low but otherwise bmet normal 8. ABLA: hgb with drop to 9.6. Add fee+ supplement, check stool for ob. Recheck cbc tomorrow. I personally reviewed the patient's labs today.  9. Adrenal insufficiency: On cortef bid for supplement. 10 Hypothyroidism: On synthroid.  11. Hypogonadism: Depotestosterone every 8 weeks--follow up with patient regarding next dose  12. 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 To follow up with cardiology after discharge--currently in normal rhythm with rate control 13. H/o Acromegaly: Treated with Sandostatin? 14. Dysphagia: likely related to surgery/collar---D3 diet initially. Consider formal evaluation if further problems.   LOS (Days) 1 A FACE TO FACE EVALUATION WAS PERFORMED  SWARTZ,ZACHARY T 09/19/2014 8:06 AM

## 2014-09-19 NOTE — Progress Notes (Signed)
Patient information reviewed and entered into eRehab system by Anup Brigham, RN, CRRN, PPS Coordinator.  Information including medical coding and functional independence measure will be reviewed and updated through discharge.    

## 2014-09-20 ENCOUNTER — Inpatient Hospital Stay (HOSPITAL_COMMUNITY): Payer: Managed Care, Other (non HMO) | Admitting: Physical Therapy

## 2014-09-20 ENCOUNTER — Inpatient Hospital Stay (HOSPITAL_COMMUNITY): Payer: Managed Care, Other (non HMO) | Admitting: *Deleted

## 2014-09-20 ENCOUNTER — Inpatient Hospital Stay (HOSPITAL_COMMUNITY): Payer: Managed Care, Other (non HMO) | Admitting: Occupational Therapy

## 2014-09-20 ENCOUNTER — Inpatient Hospital Stay (HOSPITAL_COMMUNITY): Payer: Managed Care, Other (non HMO) | Admitting: Speech Pathology

## 2014-09-20 LAB — CBC
HCT: 28.5 % — ABNORMAL LOW (ref 39.0–52.0)
Hemoglobin: 9.3 g/dL — ABNORMAL LOW (ref 13.0–17.0)
MCH: 30.9 pg (ref 26.0–34.0)
MCHC: 32.6 g/dL (ref 30.0–36.0)
MCV: 94.7 fL (ref 78.0–100.0)
Platelets: 224 10*3/uL (ref 150–400)
RBC: 3.01 MIL/uL — ABNORMAL LOW (ref 4.22–5.81)
RDW: 14.1 % (ref 11.5–15.5)
WBC: 5 10*3/uL (ref 4.0–10.5)

## 2014-09-20 MED ORDER — MUSCLE RUB 10-15 % EX CREA
TOPICAL_CREAM | CUTANEOUS | Status: DC | PRN
  Filled 2014-09-20: qty 85

## 2014-09-20 NOTE — Progress Notes (Signed)
Mark Gallagher PHYSICAL MEDICINE & REHABILITATION     PROGRESS NOTE    Subjective/Complaints: Left shoulder still sore but improving. Tingling better. Concerned about swelling in feet.  ROS: Pt denies fever, rash/itching, headache, blurred or double vision, nausea, vomiting, abdominal pain, diarrhea, chest pain, shortness of breath, palpitations, dysuria, dizziness, neck or back pain, bleeding, anxiety, or depression   Objective: Vital Signs: Blood pressure 111/66, pulse 82, temperature 98.5 F (36.9 C), temperature source Oral, resp. rate 18, height 6\' 8"  (2.032 m), weight 125.465 kg (276 lb 9.6 oz), SpO2 97 %. No results found.  Recent Labs  09/19/14 0601 09/20/14 0602  WBC 6.2 5.0  HGB 9.6* 9.3*  HCT 29.4* 28.5*  PLT 207 224    Recent Labs  09/19/14 0601  NA 133*  K 3.7  CL 98*  GLUCOSE 105*  BUN 5*  CREATININE 0.58*  CALCIUM 8.4*   CBG (last 3)  No results for input(s): GLUCAP in the last 72 hours.  Wt Readings from Last 3 Encounters:  09/18/14 125.465 kg (276 lb 9.6 oz)  08/28/14 109.997 kg (242 lb 8 oz)  08/23/14 113.399 kg (250 lb)    Physical Exam:  Constitutional: He is oriented to person, place, and time. He appears well-developed and well-nourished. Large frame/head HENT: oral mucosa pink and moist Head: Atraumatic.  Eyes: Conjunctivae and EOM are normal. Pupils are equal, round, and reactive to light. Right eye exhibits no discharge.  Neck:  Cervical collar in place  Cardiovascular: Normal rate and regular rhythm.  No murmur heard. Respiratory: Effort normal and breath sounds normal. No respiratory distress. He has no wheezes. He exhibits no tenderness.  GI: Soft. Bowel sounds are normal. He exhibits no distension. There is no tenderness.  Musculoskeletal: He exhibits edema (1+ pedal edema bilaterally). trace edema left hand Ecchymosis left 4 th toe and left lateral malleolus. 1+ edema left foot and left hand resolving. Pain over left pec  with palpation.  Neurological: He is alert and oriented to person, place, and time. RUE: 4+/5 deltoid, bicep, tricep, HI. LUE: 4/5 delt, bicep, tricep, 4-wrist and HI. Positive left pronator drift. Sensation 1+/2 LUE and LLE. RLE: 4- HF, 4/5 KE and ADF/APF. LLE: 3+HF and KE and 1/5 ADF/APF. Sensation 1++/2 left foot to LT and pain.  Able to follow commands without difficulty. Soft voice still with oral secretions.  Skin: Skin is warm and dry.  Psychiatric: He has a normal mood and affect. His behavior is normal. Judgment and thought content normal.    Assessment/Plan: 1. Functional deficits secondary to C6 SCI  which require 3+ hours per day of interdisciplinary therapy in a comprehensive inpatient rehab setting. Physiatrist is providing close team supervision and 24 hour management of active medical problems listed below. Physiatrist and rehab team continue to assess barriers to discharge/monitor patient progress toward functional and medical goals.  Function:  Bathing Bathing position   Position: Wheelchair/chair at sink  Bathing parts Body parts bathed by patient: Right arm, Left arm, Chest, Abdomen, Front perineal area, Buttocks, Right upper leg, Left upper leg Body parts bathed by helper: Right lower leg, Left lower leg, Back  Bathing assist        Upper Body Dressing/Undressing Upper body dressing   What is the patient wearing?: Pull over shirt/dress     Pull over shirt/dress - Perfomed by patient: Thread/unthread right sleeve, Thread/unthread left sleeve Pull over shirt/dress - Perfomed by helper: Put head through opening, Pull shirt over trunk  Upper body assist        Lower Body Dressing/Undressing Lower body dressing   What is the patient wearing?: Underwear, Pants, Socks, [employer]  - Performed by patient: Thread/unthread right underwear leg, Thread/unthread left underwear leg, Pull underwear up/down   Pants- Performed by patient: Thread/unthread  right pants leg, Thread/unthread left pants leg, Pull pants up/down         Socks - Performed by helper: Don/doff right sock, Don/doff left sock           TED Hose - Performed by helper: Don/doff right TED hose, Don/doff left TED hose  Lower body assist        Toileting Toileting   Toileting steps completed by patient: Performs perineal hygiene Toileting steps completed by helper: Adjust clothing prior to toileting, Adjust clothing after toileting    Toileting assist     Transfers Chair/bed transfer   Chair/bed transfer method: Stand pivot Chair/bed transfer assist level: Maximal assist (Pt 25 - 49%/lift and lower) Chair/bed transfer assistive device: Walker, Air cabin crew     Max distance: 75 Assist level: 2 helpers   Wheelchair   Type: Manual Max wheelchair distance: 150 Assist Level: Moderate assistance (Pt 50 - 74%)  Cognition Comprehension Comprehension assist level: Follows complex conversation/direction with extra time/assistive device  Expression Expression assist level: Expresses complex ideas: With extra time/assistive device  Social Interaction Social Interaction assist level: Interacts appropriately with others - No medications needed.  Problem Solving Problem solving assist level: Solves basic 90% of the time/requires cueing < 10% of the time  Memory Memory assist level: Recognizes or recalls 75 - 89% of the time/requires cueing 10 - 24% of the time    Medical Problem List and Plan: 1. Functional deficits secondary to C6 SCI with incomplete myelopathy 2. DVT Prophylaxis/Anticoagulation: Pharmaceutical: Lovenox indicated 3. Pain Management: Will continue oxycodone prn.   -  kpad prn  -  Sports cream to left pec 4. Mood: LCSW to follow for evaluation and support.  5. Neuropsych: This patient is capable of making decisions on his own behalf. 6. Skin/Wound Care: Monitor wound daily. Continue wound care dialy.  7.  Fluids/Electrolytes/Nutrition: Monitor I/O. Encourage po. Sodium sl low but otherwise bmet normal 8. ABLA: hgb with drop to 9.3. Added fe++ supplement, need stool for ob  -monitor serial cbc's 9. Adrenal insufficiency: On cortef bid for supplement. 10 Hypothyroidism: On synthroid.  11. Hypogonadism: Depotestosterone every 8 weeks--follow up with patient regarding next dose  12. 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 To follow up with cardiology after discharge--currently in normal rhythm with rate control 13. H/o Acromegaly: Treated with Sandostatin? 14. Dysphagia: likely related to surgery/collar---D3 diet initially.   -speech to eval today. Also will see for higher level cognition  15. Edema: TEDS  -ACE wrap both lower ext  LOS (Days) 2 A FACE TO FACE EVALUATION WAS PERFORMED  Gallagher,Mark T 09/20/2014 8:09 AM

## 2014-09-20 NOTE — Progress Notes (Addendum)
Social Work Assessment and Plan  Patient Details  Name: Mark Gallagher MRN: [id number] Date of Birth: 08/19/1970  Today's Date: 09/20/2014  Problem List:  Patient Active Problem List   Diagnosis Date Noted  . Tetraplegia 09/19/2014  . Acute blood loss anemia 09/19/2014  . Cervical spondylosis with myelopathy 09/18/2014  . Radiculopathy of arm 08/28/2014  . Hypogonadism male 09/08/2013  . 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 Cardiomyopathy 04/01/2011  . Wolff-Parkinson-White (WPW) syndrome 04/01/2011   Past Medical History:  Past Medical History  Diagnosis Date  . Thyroid disease   . Cancer   . Acromegaly   . Testosterone deficiency   . Knee pain, bilateral   . History of pituitary tumor    Past Surgical History:  Past Surgical History  Procedure Laterality Date  . Cervical disc surgery  2007    Anterior C4/5 vertebrectomy and c3-C6 laminoplasty with plating  . Craniotomy for tumor      pituitary adenoma   Social History:  reports that he has never smoked. He does not have any smokeless tobacco history on file. He reports that he does not drink alcohol or use illicit drugs.  Family / Support Systems Marital Status: Single Patient Roles: Other (Comment) (son; brother; friend; fulltime employee) Other Supports: Mark Gallagher - father - [phone] (h); [phone] (c)     Mark Gallagher - sister - [phone] Anticipated Caregiver: parents can provide supervision, but Mark Gallagher can do anythng physical. She is a Therapist, sports who works weekends as Retail buyer of a retirement Health and safety inspector in [place].  She is only available mon-thur. Ability/Limitations of Caregiver: parents in their 73s can provide supervision level. Mark Gallagher can provide any physical assist during the week as needed. Pt will probably need minimal assistance with some tasks.  CSW will recommend they come for family education. 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  He understands he needs to have medical clearance from his doctors, although he's very determined to return to work and not lose his insurance and position. Legal History/Current Legal Issues: none reported Guardian/Conservator: N/A - Sister is his Medical POA   Abuse/Neglect Physical Abuse: Denies Verbal Abuse: Denies Sexual Abuse: Denies Exploitation of patient/patient's resources: Denies Self-Neglect: Denies  Emotional Status Pt's affect, behavior and adjustment status: Pt is very motivated and determined to recover and get back to being able to work.  He reports being anxious during a stressful time at [facility], but he has not felt anxious since that time.  Recent Psychosocial Issues: Pt is used to his routine of work, church, and spending time with friends and family.  He is very motivated to return to this. Psychiatric History: none reported Substance Abuse History: none reported  Patient / Family Perceptions, Expectations & Goals Pt/Family understanding of illness & functional limitations: Pt seems to have a good understanding of his condition and feels his questions have been answered thus far.  Pt does seem to have some problem remembering things CSW and he had already talked about. Premorbid pt/family roles/activities: Pt enjoys sports and attending sporting events. Anticipated changes in roles/activities/participation: Pt knows he will need to be off of work for more time, but  is very eager to return to work when his FMLA time is finished. Pt/family expectations/goals: Pt wants "to be 100%" and he wants therapists to be tough on him and not sugar coat  things with him.  Community Resources Express Scripts: None Premorbid Home Care/DME Agencies: None Transportation available at discharge: family Resource referrals recommended: Neuropsychology  Discharge Planning Living Arrangements: Alone Support Systems: Parent, Other relatives, Water engineer, Social worker community Type of Residence: Private residence Insurance Resources: Multimedia programmer (specify) Scientist, clinical (histocompatibility and immunogenetics)) Financial Resources: Employment Museum/gallery curator Screen Referred: No Money Management: Patient Does the patient have any problems obtaining your medications?: No Home Management: Pt was doing this inside his townhome, but feel his family will assist him as needed. Patient/Family Preliminary Plans: Pt plans to go to his parents' home initially with transistioning to his home during the daytime and then eventually transitioning to home alone. Barriers to Discharge: Steps Social Work Anticipated Follow Up Needs: HH/OP Expected length of stay: 2 1/2 to 3 weeks  Clinical Impression CSW met with pt to introduce self and role of CSW to pt, as well as to complete assessment.  Pt was very open with CSW and we talked easily.  Pt would like to be as independent as possible, but he realizes he may need some light physical assistance.  He thinks his parents can help with this and gets a little tearful when talking about how wonderfully supportive they have been to him.  CSW to talk with pt's father to confirm d/c plan and to offer family education so that he and pt's mother will know what pt will need assistance with at d/c.  Pt is highly motivated and determined to return to work.  CSW reminded him that he will need to be cleared by his physicians before he can return to work.  He expressed understanding.  Pt has no current needs/concerns/questions, but CSW will continue to follow pt and assist with d/c plan and as needs arise.  Mark Gallagher, Silvestre Mesi 09/20/2014, 10:48 AM

## 2014-09-20 NOTE — Patient Care Conference (Signed)
Inpatient RehabilitationTeam Conference and Plan of Care Update Date: 09/19/2014   Time: 2:00 PM    Patient Name: Mark Gallagher      Medical Record Number: [id number]  Date of Birth: 30-Dec-1970 Sex: Male         Room/Bed: 4M08C/4M08C-01 Payor Info: Payor: AETNA / Plan: AETNA MANAGED / Product Type: *No Product type* /    Admitting Diagnosis: acromegaly with cervical fusion and cardiac ablation  Admit Date/Time:  09/18/2014  2:07 PM Admission Comments: No comment available   Primary Diagnosis:  Cervical spondylosis with myelopathy Principal Problem: Cervical spondylosis with myelopathy  Patient Active Problem List   Diagnosis Date Noted  . Tetraplegia 09/19/2014  . Acute blood loss anemia 09/19/2014  . Cervical spondylosis with myelopathy 09/18/2014  . Radiculopathy of arm 08/28/2014  . Hypogonadism male 09/08/2013  . 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 Cardiomyopathy 04/01/2011  . Wolff-Parkinson-White (WPW) syndrome 04/01/2011    Expected Discharge Date: Expected Discharge Date: 10/07/14  Team Members Present: Physician leading conference: Dr. Alger Simons Social Worker Present: Alfonse Alpers, LCSW Nurse Present: Heather Roberts, RN PT Present: Raylene Everts, PT OT Present: Jeannette How, OT SLP Present: Weston Anna, SLP PPS Coordinator present : Daiva Nakayama, RN, CRRN     Current Status/Progress Goal Weekly Team Focus  Medical   cervical spondylosis with myelopathy. s/p decompression, ?swallowing and cognitive issues  improve activity tolerance  swallowing, neuro-rehab, edema control   Bowel/Bladder   Pt continent of bowel and bladder. LBM 8-29.   manage bowel and bladder Mod I  assess need for any need for laxative   Swallow/Nutrition/ Hydration             ADL's   Max A LB dressing, min A UB dressing. +2 functional transfers; max- total A toileting  Overall supervision- min A  Sit <> stands, functional transfers, B dressing, AE training, fine mottor coordination    Mobility   Max to +2 A for transfers, gait, stairs; mod A w/c mobility  Mod I w/c, supervision transfers, min A gait and stairs  Motor planning, strength, safety with transfers, gait, balance   Communication             Safety/Cognition/ Behavioral Observations            Pain   pain noted to left shoulder and neck. PRN ultram 50mg  and tylenol 650mg . K PAD in use for shoulder pain  3 or less  assess pain and medicate as needed   Skin   incision to posterior neck sutures inplace. cleanse as per order and OTA.   free of skin breakdown and infection min assist. manage incision care total assist from caregiver  assess skin q shift and educate dressing change to neck    Rehab Goals Patient on target to meet rehab goals: Yes Rehab Goals Revised: none *See Care Plan and progress notes for long and short-term goals.  Barriers to Discharge: size, cognitive deficits?    Possible Resolutions to Barriers:  ongoing therapies. SLP evaluation,     Discharge Planning/Teaching Needs:  Pt plans to go to his parents' home initially and then to eventually return to his home alone.  Parents will come for family education closer to pt's d/c.   Team Discussion:  Pt transferred to CIR from [facility] and is doing well medically.  Pt has some swallowing issues and is on a D3 diet and has had some coughing.  Speech Therapy to evaluate.  Pt's good with  bowel and bladder and needs to where his collar at all times.  Pt needs cues for sequencing and motor planning.  He has been anything from min -total assist for standing.  His goals are for supervision->min assist.  Revisions to Treatment Plan:  Pt to receive 4 hours of therapy a day due to being a spinal cord patient.  He only had 3 hours scheduled on day of eval, but this will be changed going forward.   Continued Need for Acute Rehabilitation Level of Care: The patient requires daily medical management by a physician with specialized training in  physical medicine and rehabilitation for the following conditions: Daily direction of a multidisciplinary physical rehabilitation program to ensure safe treatment while eliciting the highest outcome that is of practical value to the patient.: Yes Daily medical management of patient stability for increased activity during participation in an intensive rehabilitation regime.: Yes Daily analysis of laboratory values and/or radiology reports with any subsequent need for medication adjustment of medical intervention for : Neurological problems;Post surgical problems  Mark Gallagher, Mark Gallagher 09/20/2014, 1:18 PM

## 2014-09-20 NOTE — Progress Notes (Signed)
[facility] Individual Statement of Services  Patient Name:  LADON VANDENBERGHE  Date:  09/20/2014  Welcome to the [facility].  Our goal is to provide you with an individualized program based on your diagnosis and situation, designed to meet your specific needs.  With this comprehensive rehabilitation program, you will be expected to participate in at least 3 hours of rehabilitation therapies Monday-Friday, with modified therapy programming on the weekends.  Your rehabilitation program will include the following services:  Physical Therapy (PT), Occupational Therapy (OT), Speech Therapy (ST), 24 hour per day rehabilitation nursing, Therapeutic Recreaction (TR), Case Management (Social Worker), Rehabilitation Medicine, Nutrition Services and Pharmacy Services  Weekly team conferences will be held on Tuesdays to discuss your progress.  Your Social Worker will talk with you frequently to get your input and to update you on team discussions.  Team conferences with you and your family in attendance may also be held.  Expected length of stay:  2 1/2 weeks  Overall anticipated outcome:  Minimal assistance  Depending on your progress and recovery, your program may change. Your Social Worker will coordinate services and will keep you informed of any changes. Your Social Worker's name and contact numbers are listed  below.  The following services may also be recommended but are not provided by the [facility] will be made to provide these services after discharge if needed.  Arrangements include referral to agencies that provide these services.  Your insurance has been verified to be:  Airline pilot Your primary doctor is:  Dr. Wendie Agreste  Pertinent information will be shared with your doctor and your  insurance company.  Social Worker:  Alfonse Alpers, LCSW  [phone] or ([phone]  Information discussed with and copy given to patient by: Trey Sailors, 09/20/2014, 9:55 AM

## 2014-09-20 NOTE — Progress Notes (Signed)
Occupational Therapy Session Note  Patient Details  Name: Mark Gallagher MRN: [id number] Date of Birth: 09/11/1970  Today's Date: 09/20/2014 OT Individual Time: 1025-8527 and 1100-1200 OT Individual Time Calculation (min): 60 min and 60 min   Short Term Goals: Week 1:  OT Short Term Goal 1 (Week 1): Pt will complete UB dressing with set-up OT Short Term Goal 2 (Week 1): Pt will complete toilet transfer with max A OT Short Term Goal 3 (Week 1): Pt will complete toileting task with steadying assist OT Short Term Goal 4 (Week 1): Pt will complete LB dressing with min A.  Skilled Therapeutic Interventions/Progress Updates:    Session One: Pt seen for OT ADL bathing and dressing session. Pt in supine upon arrival, agreeable to tx session. Pt ate breakfast seated on EOB, requiring increased time to use L UE to functionally feed with standard utensil and ate independently using R UE.  See below for required assist for transfers and bathing/ dressing tasks. Pt required increased assist for LB dressing due to decreased sitting balance and ROM. Assist provided to position pt's ankle over knee for pt to access B feet to wash and to thread. Demonstration and VCs provided for various techniques to try for LB bathing/ dressing. Will cont to educate and provide AE as needed. Pt completed sit <> stands from elevated bed with max A with blocking of L knee to RW. Steadying assist required while completing buttock hygiene and pulling pants up. Pt left sitting EOB with NT present assisting with TED hose and shoes.   Education provided throughout session regarding OT goals, pt progress, SCI, and d/c planning.   Session Two: Pt seen for OT therapy session focusing on functional transfers and neuromuscular re-education. Pt sitting up in w/c upon arrival, agreeable to tx session. He completed simulated toilet transfer to [facility] from w/c. +2 assist required to stand. Pt with very strong posterior push back when standing,  verbal and demonstrational cues provided for forward weight shift during transfer. Once standing, pt min A stand pivot to/from [facility] and VCs provided for sequencing of footwork and sit <> stand hand placement. Pt educated regarding use of lateral leans for clothing management while seated on BSC, however, pt felt pulling in groin area at surgical site when leaning to L and shoulder pain when leaning to R. Recommend that nursing cont to use STEADY lift when assist pt with mobility. In therapy gym, pt stood for 14 minutes in standing frame while completingclothes pin tree. Pt able to manipulate all clothes pins with exception of high resistance black clothes pin using L UE. VCs provided throughout for weight shift as pt has tendency to lean to R. Pt required mod- max A for controlled descent with VCs for technique. Pt then completed x2 sit <> stands using RW. Pt required +2 assist to stand with B LEs blocked and pushing up from w/c armrests and then RW placed in front of pt for stability. Pt stood with min A-CGA for static standing. Pt returned to room at end of session, left sitting in w/c with all needs in reach.   Therapy Documentation Precautions:  Precautions Precautions: Cervical Precaution Comments: [place] J cervical immobilizer Required Braces or Orthoses: Cervical Brace Cervical Brace: Hard collar, At all times Restrictions Weight Bearing Restrictions: No Pain: Pain Assessment Pain Score: 2  Pain Type: Acute pain Pain Location: Neck Pain Descriptors / Indicators: Aching Pain Intervention(s): Medication (See eMAR)  Function:   Eating Eating   Eating  Assist Level: Assistive Device Eating Assistive Device Comment: red foam handle                                                             Bathing Bathing position   Position: Sitting EOB  Bathing parts Body parts bathed by patient: Right arm;Left arm;Chest;Abdomen;Front perineal area;Buttocks;Right upper leg;Left upper leg Body  parts bathed by helper: Right lower leg;Left lower leg;Back  Bathing assist         Upper Body Dressing/Undressing Upper body dressing   What is the patient wearing?: Pull over shirt/dress     Pull over shirt/dress - Perfomed by patient: Thread/unthread right sleeve;Thread/unthread left sleeve;Put head through opening;Pull shirt over trunk          Upper body assist Assist Level: Set up   Set up : To obtain clothing/put away;To apply TLSO, cervical collar   Lower Body Dressing/Undressing Lower body dressing   What is the patient wearing?: Underwear;Pants;Ted Hose;Socks Underwear - Performed by patient: Pull underwear up/down   Pants- Performed by patient: Pull pants up/down         Socks - Performed by helper: Don/doff right sock;Don/doff left sock           TED Hose - Performed by helper: Don/doff right TED hose;Don/doff left TED hose  Lower body assist          Bed Mobility Roll left and right activity   Assist level: Supervision or verbal cues    Sit to lying activity   Assist level: Supervision or verbal cues    Lying to sitting activity   Assist level: Moderate assist (Pt 50 - 74%, lift 2 legs) Lying to sitting assistive device: Bedrails  Mobility details Bed mobility details: Verbal cues for precautions/safety;Verbal cues for techniques;Verbal cues for sequencing    Transfers Sit to stand transfer   Sit to stand assist level: 2 helpers Sit to stand assistive device: Armrests;Walker  Chair/bed transfer   Chair/bed transfer method: Stand pivot Chair/bed transfer assist level: Maximal assist (Pt 25 - 49%/lift and lower) Chair/bed transfer assistive device: Walker;Armrests   Chair/bed transfer details: Verbal cues for precautions/safety;Verbal cues for technique;Tactile cues for weight bearing  Toilet transfer   Toilet transfer assistive device: Bedside commode Assist level to bedside commode (at bedside): 2 helpers Assist level from bedside  commode (at bedside): 2 helpers        Tub/shower transfer             Cognition Comprehension Comprehension assist level: Follows complex conversation/direction with extra time/assistive device  Expression Expression assist level: Expresses complex ideas: With extra time/assistive device  Social Interaction Social Interaction assist level: Interacts appropriately with others with medication or extra time (anti-anxiety, antidepressant).  Problem Solving Problem solving assist level: Solves basic 90% of the time/requires cueing < 10% of the time  Memory Memory assist level: Recognizes or recalls 75 - 89% of the time/requires cueing 10 - 24% of the time    Therapy/Group: Individual Therapy  Lewis, Lofton Leon C 09/20/2014, 12:15 PM

## 2014-09-20 NOTE — Progress Notes (Signed)
Physical Therapy Session Note  Patient Details  Name: Mark Gallagher MRN: [id number] Date of Birth: 14-Dec-1970  Today's Date: 09/20/2014 PT Individual Time: 0900-1000 PT Individual Time Calculation (min): 60 min   Short Term Goals: Week 1:  PT Short Term Goal 1 (Week 1): Pt will perform bed mobility with min A PT Short Term Goal 2 (Week 1): Pt will perform bed <> chair transfers mod A of one person PT Short Term Goal 3 (Week 1): Pt will perform w/c mobility x 150' with supervision in controlled environment PT Short Term Goal 4 (Week 1): Pt will perform gait x 100' with LRAD and mod A of one person PT Short Term Goal 5 (Week 1): Pt will negotiate 4 steps with 2 rails and mod A of one person  Skilled Therapeutic Interventions/Progress Updates:   Session focused on functional mobility training, NMR, standing balance, and activity tolerance. Patient sitting edge of bed, RN present for nursing care and therapist assisted with repositioning cervical collar. Performed sit <> stand from slightly raised hospital bed with verbal cues for forward weight shift and max A, +2 present for safety and to stabilize bed to prevent from sliding. Patient performed stand step transfers using heavy duty RW with min A overall, light tactile cuing at L quadriceps in case of buckling. Patient propelled wheelchair varying from S-mod A due to LUE weakness and difficulty steering/sequencing turning. Performed bed mobility as below with dizziness noted with rolling to R and L lasting about 20-30 sec at a time, verbal cues for gaze stabilization. Patient attempted to perform supine > sit using primarily abdominal muscles, requiring mod A to come to seated position, patient instructed in log rolling technique for supine > sidelying > sit and verbalized understanding. Patient c/o soreness at L hip with L sidelying from previous falls. Gait using heavy duty RW x 90 ft with min A x 2 in controlled environment, light tactile cuing at  L knee with instability noted but did not buckle during gait. Performed multiple sit <> stand transfers using RW from 23.5-inch mat and from wheelchair with mod-max A x 2 overall, max verbal/demonstration cues for anterior weight shift and translation of weight over BLE in standing with cues to "pull belly button forward" due to posterior lean. NMR from seated position to facilitate squat position, reaching with R and L hand to place object to target with +2 assist to facilitate upright trunk and anterior weight shift. Patient left sitting in wheelchair with all needs within reach.  Therapy Documentation Precautions:  Precautions Precautions: Cervical Precaution Comments: [place] J cervical immobilizer Required Braces or Orthoses: Cervical Brace Cervical Brace: Hard collar, At all times Restrictions Weight Bearing Restrictions: No Pain: Pain Assessment Pain Score: 2  Pain Type: Acute pain Pain Location: Neck Pain Descriptors / Indicators: Aching Pain Intervention(s): Medication (See eMAR)   Function:  Toileting Toileting             Bed Mobility Roll left and right activity   Assist level: Supervision or verbal cues    Sit to lying activity   Assist level: Supervision or verbal cues    Lying to sitting activity   Assist level: Moderate assist (Pt 50 - 74%, lift 2 legs)    Mobility details Bed mobility details: Verbal cues for precautions/safety;Verbal cues for techniques;Verbal cues for sequencing   Transfers Sit to stand transfer   Sit to stand assist level: 2 helpers Sit to stand assistive device: Armrests;Walker  Chair/bed transfer  Chair/bed transfer method: Stand pivot Chair/bed transfer assist level: Maximal assist (Pt 25 - 49%/lift and lower) Chair/bed transfer assistive device: Walker;Armrests   Chair/bed transfer details: Verbal cues for precautions/safety;Verbal cues for technique;Tactile cues for weight bearing   Barista device: Walker-rolling Max distance: 90 ft Assist level: 2 helpers  Walk 10 feet activity   Assist level: 2 helpers  Walk 50 feet with 2 turns activity   Assist level: 2 helpers  Walk 150 feet activity      Walk 10 feet on uneven surfaces activity      Stairs          Walk up/down 1 step activity        Walk up/down 4 steps activity      Walk up/down 12 steps activity      Pick up small objects from floor      Wheelchair   Type: Manual Max wheelchair distance: 150 ft Assist Level: Moderate assistance (Pt 50 - 74%)  Wheel 50 feet with 2 turns activity   Assist Level: Moderate assistance (Pt 50 - 74%)  Wheel 150 feet activity   Assist Level: Moderate assistance (Pt 50 - 74%)   Cognition Comprehension Comprehension assist level: Follows complex conversation/direction with extra time/assistive device  Expression Expression assist level: Expresses complex ideas: With extra time/assistive device  Social Interaction Social Interaction assist level: Interacts appropriately with others with medication or extra time (anti-anxiety, antidepressant).  Problem Solving Problem solving assist level: Solves basic 90% of the time/requires cueing < 10% of the time  Memory Memory assist level: Recognizes or recalls 75 - 89% of the time/requires cueing 10 - 24% of the time    Therapy/Group: Individual Therapy  Carney Living Aa 09/20/2014, 10:17 AM

## 2014-09-20 NOTE — Evaluation (Signed)
Speech Language Pathology Assessment and Plan  Patient Details  Name: Mark Gallagher MRN: [id number] Date of Birth: 1970/02/28  SLP Diagnosis: Dysphagia  Rehab Potential: Excellent ELOS: 10/07/14    Today's Date: 09/20/2014 SLP Individual Time: 1400-1510 SLP Individual Time Calculation (min): 70 min   Problem List:  Patient Active Problem List   Diagnosis Date Noted  . Tetraplegia 09/19/2014  . Acute blood loss anemia 09/19/2014  . Cervical spondylosis with myelopathy 09/18/2014  . Radiculopathy of arm 08/28/2014  . Hypogonadism male 09/08/2013  . 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 Cardiomyopathy 04/01/2011  . Wolff-Parkinson-White (WPW) syndrome 04/01/2011   Past Medical History:  Past Medical History  Diagnosis Date  . Thyroid disease   . Cancer   . Acromegaly   . Testosterone deficiency   . Knee pain, bilateral   . History of pituitary tumor    Past Surgical History:  Past Surgical History  Procedure Laterality Date  . Cervical disc surgery  2007    Anterior C4/5 vertebrectomy and c3-C6 laminoplasty with plating  . Craniotomy for tumor      pituitary adenoma    Assessment / Plan / Recommendation Clinical Impression Patient is a 44 year old male with history of acromegaly, s/p pituitary adenoma resection, s/p cervical vertebrectomy C4/5 with C3-6 plating 2007 who started developing progressive pain LUE with tingling left arm, weakness BLE with left foot drop and multiple falls. MRI spine revealed cervical canal stenosis C6/7 with T2 signal in adjacent spinal cord. He was admitted to [facility] on 09/09/14 for C2-T3 decompression and fusion by Dr. Tommi Rumps.  Post op treated with one unit PRBC for ABLA. On 08/23, he developed SVT requiring cardioversion and spontaneously converted to NSR. He had developed recurrent SVT with hypotension and underwent cardiac ablation on 08/27 with resolution of arrhthymias.  Transferred to CIR on 8/29 and reporting difficulty with swallowing.  SLP  administered a cognitive-linguistic evaluation and BSE. Patient's overall cognitive-linguistic function appears [facility] for all tasks assessed. Patient was able to direct his care in regards to a transfer from the wheelchair to the bed with the [facility] with Mod I. Patient consumed thin liquids via straw without overt s/s of aspiration and utilized multiple swallows as a "precaution." Patient also demonstrated efficient mastication with Dys. 3 textures without overt s/s of aspiration but reported difficulty consuming meats. SLP educated patient in regards to current swallowing function, diet recommendations, appropriate textures and swallowing compensatory strategies (sitting EOB or OOB, self-feeding and utilizing extra sauce/gravy for meat). Patient verbalized understanding of all information. Patient would benefit from skilled SLP intervention to maximize swallowing function and overall functional independence prior to discharge.   Skilled Therapeutic Interventions          Administered a cognitive-linguistic evaluation and BSE. Please see above for details. Educated patient in regards to current swallowing function, diet recommendations, appropriate textures and swallowing compensatory strategies (sitting EOB or OOB, self-feeding and utilizing extra sauce/gravy for meat). Patient verbalized understanding of all information.   SLP Assessment  Patient will need skilled Speech Lanaguage Pathology Services during CIR admission    Recommendations  SLP Diet Recommendations: Dysphagia 3 (Mech soft);Thin Liquid Administration via: Straw Medication Administration: Whole meds with liquid (or puree, per patient preference ) Supervision: Patient able to self feed;Intermittent supervision to cue for compensatory strategies Compensations: Slow rate Postural Changes and/or Swallow Maneuvers: Out of bed for meals;Seated upright 90 degrees;Upright 30-60 min after meal Oral Care Recommendations: Oral care BID Patient  destination: Home Follow up Recommendations:  (  TBD) Equipment Recommended: None recommended by SLP    SLP Frequency 1 to 3 out of 7 days   SLP Treatment/Interventions Environmental controls;Dysphagia/aspiration precaution training;Patient/family education;Therapeutic Activities;Functional tasks;Cueing hierarchy   Pain Pain Assessment Pain Score: 4  Pain Type: Acute pain Pain Location: Neck Pain Descriptors / Indicators: Aching Pain Intervention(s): Medication (See eMAR)  Function:  Eating Eating   Modified Consistency Diet: Yes Eating Assist Level: Assistive Device Eating Assistive Device Comment: red foam handle Eating Set Up Assist For: Opening containers       Cognition Comprehension Comprehension assist level: Follows complex conversation/direction with extra time/assistive device  Expression   Expression assist level: Expresses complex ideas: With extra time/assistive device  Social Interaction Social Interaction assist level: Interacts appropriately with others with medication or extra time (anti-anxiety, antidepressant).  Problem Solving Problem solving assist level: Solves basic problems with no assist  Memory Memory assist level: More than reasonable amount of time   Short Term Goals: Week 1: SLP Short Term Goal 1 (Week 1): Patient will consume current diet with minimal overt s/s of aspiration with supervision verbal cues for use of swallowing compensatory strategies.  SLP Short Term Goal 2 (Week 1): Patient will demonstrate efficient masticaiton of regular textures without overt s/s of aspiration with supervision over 3 sessions prior to upgrade.   Refer to Care Plan for Long Term Goals  Recommendations for other services: None  Discharge Criteria: Patient will be discharged from SLP if patient refuses treatment 3 consecutive times without medical reason, if treatment goals not met, if there is a change in medical status, if patient makes no progress towards goals  or if patient is discharged from hospital.  The above assessment, treatment plan, treatment alternatives and goals were discussed and mutually agreed upon: by patient  Jaysie Benthall 09/20/2014, 4:16 PM

## 2014-09-21 ENCOUNTER — Inpatient Hospital Stay (HOSPITAL_COMMUNITY): Payer: Managed Care, Other (non HMO) | Admitting: Occupational Therapy

## 2014-09-21 ENCOUNTER — Inpatient Hospital Stay (HOSPITAL_COMMUNITY): Payer: Managed Care, Other (non HMO) | Admitting: Speech Pathology

## 2014-09-21 ENCOUNTER — Inpatient Hospital Stay (HOSPITAL_COMMUNITY): Payer: Managed Care, Other (non HMO) | Admitting: Physical Therapy

## 2014-09-21 LAB — CBC
HCT: 28.4 % — ABNORMAL LOW (ref 39.0–52.0)
Hemoglobin: 9.3 g/dL — ABNORMAL LOW (ref 13.0–17.0)
MCH: 31 pg (ref 26.0–34.0)
MCHC: 32.7 g/dL (ref 30.0–36.0)
MCV: 94.7 fL (ref 78.0–100.0)
Platelets: 251 10*3/uL (ref 150–400)
RBC: 3 MIL/uL — ABNORMAL LOW (ref 4.22–5.81)
RDW: 14.1 % (ref 11.5–15.5)
WBC: 4.7 10*3/uL (ref 4.0–10.5)

## 2014-09-21 NOTE — Progress Notes (Signed)
Social Work Patient ID: [name], male   DOB: [birth year], 44 y.o.   MRN: [id number]   CSW met with pt to update him on team conference discussion and later spoke with his mother via telephone to update her, as well.  She was already aware of pt's d/c date, as pt shared it with her.  She stated that she and her husband will be prepared to take pt to their home at d/c.  CSW explained that he will have some tasks he will need min assist with and she seemed to feel comfortable with this.  CSW told her that we would schedule family education time prior to d/c so that family knew what would be needed.  She was appreciative.  She stated that pt's sister would be here 09-21-14 to visit pt and observe him in therapies.  She is a Marine scientist in [place].  Pt and mother are appreciative of pt's opportunity for rehab through 10-07-14, although pt would be happy to d/c sooner should he progress faster than anticipated.  CSW will continue to follow for support and assistance as needed.

## 2014-09-21 NOTE — Progress Notes (Signed)
[place] PHYSICAL MEDICINE & REHABILITATION     PROGRESS NOTE    Subjective/Complaints: Left shoulder feels better. Slept well last night---rehab tired him out!! Wore ACE's all night.  ROS: Pt denies fever, rash/itching, headache, blurred or double vision, nausea, vomiting, abdominal pain, diarrhea, chest pain, shortness of breath, palpitations, dysuria, dizziness, neck or back pain, bleeding, anxiety, or depression   Objective: Vital Signs: Blood pressure 112/73, pulse 71, temperature 98 F (36.7 C), temperature source Oral, resp. rate 16, height 6\' 8"  (2.032 m), weight 121.292 kg (267 lb 6.4 oz), SpO2 97 %. No results found.  Recent Labs  09/20/14 0602 09/21/14 0406  WBC 5.0 4.7  HGB 9.3* 9.3*  HCT 28.5* 28.4*  PLT 224 251    Recent Labs  09/19/14 0601  NA 133*  K 3.7  CL 98*  GLUCOSE 105*  BUN 5*  CREATININE 0.58*  CALCIUM 8.4*   CBG (last 3)  No results for input(s): GLUCAP in the last 72 hours.  Wt Readings from Last 3 Encounters:  09/20/14 121.292 kg (267 lb 6.4 oz)  08/28/14 109.997 kg (242 lb 8 oz)  08/23/14 113.399 kg (250 lb)    Physical Exam:  Constitutional: He is oriented to person, place, and time. He appears well-developed and well-nourished. Large frame/head HENT: oral mucosa pink and moist Head: Atraumatic.  Eyes: Conjunctivae and EOM are normal. Pupils are equal, round, and reactive to light. Right eye exhibits no discharge.  Neck:  Cervical collar in place  Cardiovascular: Normal rate and regular rhythm.  No murmur heard. Respiratory: Effort normal and breath sounds normal. No respiratory distress. He has no wheezes. He exhibits no tenderness.  GI: Soft. Bowel sounds are normal. He exhibits no distension. There is no tenderness.  Musculoskeletal: He exhibits edema (1+ pedal edema bilaterally). trace edema left hand Ecchymosis left 4 th toe and left lateral malleolus. Trace to 1+ edema left foot and left hand improved Pain over  left pec with palpation.  Neurological: He is alert and oriented to person, place, and time. RUE: 4+/5 deltoid, bicep, tricep, HI. LUE: 4/5 delt, bicep, tricep, 4-wrist and HI. Positive left pronator drift. Sensation 1+/2 LUE and LLE. RLE: 4- HF, 4/5 KE and ADF/APF. LLE: 3+HF and KE and 1/5 ADF/APF. Sensation 1++/2 left foot to LT and pain.  Able to follow commands without difficulty. Soft voice still with oral secretions.  Skin: Skin is warm and dry.  Psychiatric: He has a normal mood and affect. His behavior is normal. Judgment and thought content normal.    Assessment/Plan: 1. Functional deficits secondary to C6 SCI  which require 3+ hours per day of interdisciplinary therapy in a comprehensive inpatient rehab setting. Physiatrist is providing close team supervision and 24 hour management of active medical problems listed below. Physiatrist and rehab team continue to assess barriers to discharge/monitor patient progress toward functional and medical goals.  Function:  Bathing Bathing position   Position: Sitting EOB  Bathing parts Body parts bathed by patient: Right arm, Left arm, Chest, Abdomen, Front perineal area, Buttocks, Right upper leg, Left upper leg Body parts bathed by helper: Right lower leg, Left lower leg, Back  Bathing assist        Upper Body Dressing/Undressing Upper body dressing   What is the patient wearing?: Pull over shirt/dress     Pull over shirt/dress - Perfomed by patient: Thread/unthread right sleeve, Thread/unthread left sleeve, Put head through opening, Pull shirt over trunk Pull over shirt/dress - Perfomed by helper:  Put head through opening, Pull shirt over trunk        Upper body assist Assist Level: Set up   Set up : To obtain clothing/put away, To apply TLSO, cervical collar  Lower Body Dressing/Undressing Lower body dressing   What is the patient wearing?: Underwear, Pants, [employer], Socks Underwear - Performed by patient: Pull underwear  up/down   Pants- Performed by patient: Pull pants up/down         Socks - Performed by helper: Don/doff right sock, Don/doff left sock           TED Hose - Performed by helper: Don/doff right TED hose, Don/doff left TED hose  Lower body assist        Toileting Toileting   Toileting steps completed by patient: Performs perineal hygiene Toileting steps completed by helper: Adjust clothing after toileting    Toileting assist     Transfers Chair/bed transfer   Chair/bed transfer method: Stand pivot Chair/bed transfer assist level: Maximal assist (Pt 25 - 49%/lift and lower) Chair/bed transfer assistive device: Walker, Air cabin crew     Max distance: 90 ft Assist level: 2 helpers   Wheelchair   Type: Manual Max wheelchair distance: 150 ft Assist Level: Moderate assistance (Pt 50 - 74%)  Cognition Comprehension Comprehension assist level: Follows complex conversation/direction with no assist  Expression Expression assist level: Expresses complex ideas: With no assist  Social Interaction Social Interaction assist level: Interacts appropriately 90% of the time - Needs monitoring or encouragement for participation or interaction.  Problem Solving Problem solving assist level: Solves basic 25 - 49% of the time - needs direction more than half the time to initiate, plan or complete simple activities  Memory Memory assist level: More than reasonable amount of time    Medical Problem List and Plan: 1. Functional deficits secondary to C6 SCI with incomplete myelopathy 2. DVT Prophylaxis/Anticoagulation: Pharmaceutical: Lovenox indicated 3. Pain Management: Will continue oxycodone prn.   -  kpad prn  -  Sports cream to left pec prn 4. Mood: LCSW to follow for evaluation and support.  5. Neuropsych: This patient is capable of making decisions on his own behalf. 6. Skin/Wound Care: Monitor wound daily. Continue wound care dialy.  7.  Fluids/Electrolytes/Nutrition: Monitor I/O. Encourage po. Sodium sl low but otherwise bmet normal 8. ABLA: hgb stable at 9.3. Added fe++ supplement, need stool for ob still  -check hgb again tomorrow 9. Adrenal insufficiency: On cortef bid for supplement. 10 Hypothyroidism: On synthroid.  11. Hypogonadism: Depotestosterone every 8 weeks--follow up with patient regarding next dose  12. 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 To follow up with cardiology after discharge--currently in normal rhythm with rate control 13. H/o Acromegaly: Treated with Sandostatin? 14. Dysphagia: likely related to surgery/collar---D3 diet initially.   -speech to eval today. Also will see for higher level cognition  15. Edema: TEDS  -continue ACE wraps  LOS (Days) 3 A FACE TO FACE EVALUATION WAS PERFORMED  Darryll Raju T 09/21/2014 9:01 AM

## 2014-09-21 NOTE — Progress Notes (Signed)
Occupational Therapy Session Note  Patient Details  Name: Mark Gallagher MRN: [id number] Date of Birth: 08/20/1970  Today's Date: 09/21/2014 OT Individual Time: 2458-0998 and 1030-1100 OT Individual Time Calculation (min): 60 min and 30 min   Short Term Goals: Week 1:  OT Short Term Goal 1 (Week 1): Pt will complete UB dressing with set-up OT Short Term Goal 2 (Week 1): Pt will complete toilet transfer with max A OT Short Term Goal 3 (Week 1): Pt will complete toileting task with steadying assist OT Short Term Goal 4 (Week 1): Pt will complete LB dressing with min A.  Skilled Therapeutic Interventions/Progress Updates:    Session One: Pt seen for OT ADL bathing and dressing session. Pt in supine upon arrival, agreeable to tx session. See below to required assist for transfers and bathing/ dressing tasks. Pt provided with and educated regarding use of LH sponge for LB bathing, demonstrating understanding. Pt likes to use L UE in order to strengthen weaker extremity, recommended using B UEs on LH sponge in order to have control and pressure to use sponge in functional manner while also strengthening L UE. Pt required max A to stand with VCs for technique. He has strong posterior push when coming sit > stand, pushing back seating surface when coming into standing. Verbal and tactile cues provided for sit <> stand technique. Pt left in sitting in recliner at end of session, set-up with breakfast tray.   Session Two: Pt seen for OT therapy session focusing on fine motor coordination, specifically with handwriting. Pt's sister present for session.  Pt in recliner upon arrival, requiring max A to stand and complete stand pivot transfer using RW to w/c. Pt taken to therapy day room total A for time. Pt set-up with hand witting task, writing out his personal address using his dominant L UE. Various techniques and set-up trials provided with pt demonstrated most success at high/low table with elbow supported  on pillow. Stress ball placed in palm of hand and pen on top. Pt required increased time and concentration to write address, able to write address legibly. Pt returned to room at end of session, left sitting in w/c with all needs in reach.   Pt educated throughout session regarding rate of return,  Fine motor coordination, 3:1 hospital stay to recovery ration, energy conservation, and d/c planning.    Therapy Documentation Precautions:  Precautions Precautions: Cervical Precaution Comments: [place] cervical immobilizer Required Braces or Orthoses: Cervical Brace Cervical Brace: Hard collar, At all times Restrictions Weight Bearing Restrictions: No Pain: Pain Assessment Pain Assessment: 0-10 Pain Score: 3  Pain Type: Acute pain Pain Location: Neck Pain Orientation: Posterior Pain Descriptors / Indicators: Aching Pain Onset: On-going Pain Intervention(s): Rest  Function:   Eating Eating   Eating Assist Level: Assistive Device Eating Assistive Device Comment: red foam handle         Grooming Oral Care,Brush Teeth, Clean Dentures Activity:      Assist Level: Set up   Set up : To obtain items  Wash, Rinse, Dry Face Activity   Assist Level: Set up      Wash, Rinse, Dry Hands Activity          Brush, Comb Hair Activity   Assist Level: Set up to obtain items    Shave Activity Shave activity did not occur: Safety/medical concerns        Apply Makeup Activity Apply makeup activity did not occur: Patient does not wear makeup  Bathing Bathing position   Position: Sitting EOB  Bathing parts Body parts bathed by patient: Right arm;Left arm;Chest;Abdomen;Front perineal area;Buttocks;Right upper leg;Left upper leg;Right lower leg;Left lower leg Body parts bathed by helper: Back  Bathing assist Assist Level: Supervision or verbal cues;Assistive device Assistive Device Comment: LH Sponge     Upper Body  Dressing/Undressing Upper body dressing   What is the patient wearing?: Pull over shirt/dress     Pull over shirt/dress - Perfomed by patient: Thread/unthread right sleeve;Thread/unthread left sleeve;Put head through opening;Pull shirt over trunk          Upper body assist Assist Level: Set up   Set up : To obtain clothing/put away;To apply TLSO, cervical collar   Lower Body Dressing/Undressing Lower body dressing   What is the patient wearing?: Underwear;Pants;Ted Hose;Socks Underwear - Performed by patient: Thread/unthread right underwear leg;Thread/unthread left underwear leg;Pull underwear up/down   Pants- Performed by patient: Thread/unthread right pants leg;Thread/unthread left pants leg;Pull pants up/down         Socks - Performed by helper: Don/doff right sock;Don/doff left sock           TED Hose - Performed by helper: Don/doff right TED hose;Don/doff left TED hose  Lower body assist Assist Level: Touching or steadying assistance (Pt > 75%)        Bed Mobility Roll left and right activity        Sit to lying activity   Assist level: No help, No cues, assistive device, takes more than a reasonable amount of time Sit to lying assistive device: Bedrails;HOB elevated  Lying to sitting activity        Mobility details      Transfers Sit to stand transfer   Sit to stand assist level: Maximal assist (Pt 25 - 49%/lift and lower) Sit to stand assistive device: Armrests;Walker  Chair/bed transfer   Chair/bed transfer method: Stand pivot Chair/bed transfer assist level: Maximal assist (Pt 25 - 49%/lift and lower) Chair/bed transfer assistive device: Walker;Armrests   Chair/bed transfer details: Verbal cues for precautions/safety;Verbal cues for technique;Tactile cues for weight bearing  Toilet transfer                Tub/shower transfer             Cognition Comprehension Comprehension assist level: Follows complex conversation/direction with  extra time/assistive device  Expression Expression assist level: Expresses complex ideas: With extra time/assistive device  Social Interaction Social Interaction assist level: Interacts appropriately with others with medication or extra time (anti-anxiety, antidepressant).  Problem Solving Problem solving assist level: Solves basic problems with no assist  Memory Memory assist level: More than reasonable amount of time    Therapy/Group: Individual Therapy  Lewis, Ladasia Sircy C 09/21/2014, 11:25 AM

## 2014-09-21 NOTE — IPOC Note (Signed)
Overall Plan of Care [facility]) Patient Details Name: Mark Gallagher MRN: [id number] DOB: Jan 17, 1971  Admitting Diagnosis: acromegaly with cervical fusion and cardiac ablation  Hospital Problems: Principal Problem:   Cervical spondylosis with myelopathy Active Problems:   Acromegaly   Tetraplegia   Acute blood loss anemia     Functional Problem List: Nursing Behavior, Edema, Endurance, Medication Management, Motor, Nutrition, Pain, Safety, Skin Integrity  PT Balance, Edema, Motor, Pain, Sensory, Other (comment) (cognition/sequencing)  OT Balance, Cognition, Edema, Pain, Sensory, Safety  SLP    TR Edema, Endurance, Motor, Pain, Safety, Skin Integrity       Basic ADL's: OT Eating, Grooming, Bathing, Dressing, Toileting     Advanced  ADL's: OT Simple Meal Preparation, Laundry     Transfers: PT Bed Mobility, Bed to Chair, Car, Manufacturing systems engineer, Metallurgist: PT Ambulation, Emergency planning/management officer, Stairs     Additional Impairments: OT Fuctional Use of Upper Extremity  SLP Swallowing      TR      Anticipated Outcomes Item Anticipated Outcome  Self Feeding Mod I  Swallowing  Mod I with least restrictive diet    Basic self-care  Media planner Transfers Supervision- Min A  Bowel/Bladder  Min assist  Transfers  Supervision  Locomotion  Min A  Communication     Cognition     Pain  <3 on a 0-10 scale  Safety/Judgment  Mod a ssist   Therapy Plan: PT Intensity: Minimum of 1-2 x/day ,45 to 90 minutes PT Frequency: 5 out of 7 days PT Duration Estimated Length of Stay: 2.5-3 weeks OT Intensity: Minimum of 1-2 x/day, 45 to 90 minutes OT Frequency: 5 out of 7 days OT Duration/Estimated Length of Stay: 18-20 days SLP Intensity: Minumum of 1-2 x/day, 30 to 90 minutes SLP Frequency: 1 to 3 out of 7 days SLP Duration/Estimated Length of Stay: 10/07/14       Team Interventions: Nursing Interventions Patient/Family  Education, Disease Management/Prevention, Pain Management, Medication Management, Skin Care/Wound Management, Discharge Planning, Psychosocial Support  PT interventions Ambulation/gait training, Training and development officer, Cognitive remediation/compensation, Community reintegration, Discharge planning, DME/adaptive equipment instruction, Functional mobility training, Neuromuscular re-education, Pain management, Patient/family education, Splinting/orthotics, Stair training, Psychosocial support, Therapeutic Activities, Therapeutic Exercise, UE/LE Strength taining/ROM, UE/LE Coordination activities, Wheelchair propulsion/positioning  OT Interventions Training and development officer, Cognitive remediation/compensation, Academic librarian, Discharge planning, DME/adaptive equipment instruction, Functional mobility training, Pain management, Patient/family education, Psychosocial support, Self Care/advanced ADL retraining, Therapeutic Activities, Therapeutic Exercise, UE/LE Strength taining/ROM, UE/LE Coordination activities, Visual/perceptual remediation/compensation, Wheelchair propulsion/positioning  SLP Interventions Environmental controls, Dysphagia/aspiration precaution training, Patient/family education, Therapeutic Activities, Functional tasks, Cueing hierarchy  TR Interventions Adaptive equipment instruction, 1:1 session, Training and development officer, Functional mobility training, Academic librarian, Barrister's clerk education, Therapeutic activities, Recreation/leisure participation, Therapeutic exercise, UE/LE Coordination activities, Wheelchair propulsion/positioning  SW/CM Interventions Discharge Planning, Barrister's clerk, Engineer, mining    Team Discharge Planning: Destination: PT-Home ,OT- Home (Parent's home until able to live independently) , SLP-Home Projected Follow-up: PT-Home health PT, OT-  Home health OT, SLP- (TBD) Projected Equipment Needs: PT-Rolling walker with 5"  wheels, Wheelchair cushion (measurements), Wheelchair (measurements), OT- To be determined, SLP-None recommended by SLP Equipment Details: PT- , OT-  Patient/family involved in discharge planning: PT- Patient,  OT-Patient, SLP-Patient  MD ELOS: 18-20 days Medical Rehab Prognosis:  Excellent Assessment: The patient has been admitted for CIR therapies with the diagnosis of cervical myelopathy. The team will be addressing functional mobility, strength, stamina, balance, safety, adaptive  techniques and equipment, self-care, bowel and bladder mgt, patient and caregiver education, pain mgt, edema control, orthotics, cognition, swallowing. Goals have been set at supervision for basic self-care, ADL's, transfers, min assist for ambulation, supervision to mod I with swallowing/cognition.    Meredith Staggers, MD, FAAPMR      See Team Conference Notes for weekly updates to the plan of care

## 2014-09-21 NOTE — Progress Notes (Signed)
Speech Language Pathology Daily Session Note  Patient Details  Name: Mark Gallagher MRN: [id number] Date of Birth: 16-Sep-1970  Today's Date: 09/21/2014 SLP Individual Time: 1305-1405 SLP Individual Time Calculation (min): 60 min  Short Term Goals: Week 1: SLP Short Term Goal 1 (Week 1): Patient will consume current diet with minimal overt s/s of aspiration with supervision verbal cues for use of swallowing compensatory strategies.  SLP Short Term Goal 2 (Week 1): Patient will demonstrate efficient masticaiton of regular textures without overt s/s of aspiration with supervision over 3 sessions prior to upgrade.   Skilled Therapeutic Interventions: Skilled treatment session focused on dysphagia goals. Upon arrival, patient was asleep while supine in bed but was easily awakened and agreeable to participate in treatment session. SLP transferred the patient to the wheelchair via the [facility] with +2 assist for safety. Patient consumed lunch meal of Dys. 3 textures with thin liquids via straw without overt s/s of aspiration and required supervision verbal cues for use of small bites. Recommend to continue current diet. Patient's sister present and educated on patient's current swallowing function, diet recommendations and appropriate textures. She verbalized understanding of all information. Patient left upright in wheelchair with all needs within reach and sister present. Continue with current plan of care.    Function:  Eating Eating   Modified Consistency Diet: Yes Eating Assist Level: Assistive Device;Supervision or verbal cues Eating Assistive Device Comment: red foam handle         Cognition Comprehension Comprehension assist level: Follows complex conversation/direction with extra time/assistive device  Expression   Expression assist level: Expresses complex ideas: With extra time/assistive device  Social Interaction Social Interaction assist level: Interacts appropriately with others  with medication or extra time (anti-anxiety, antidepressant).  Problem Solving Problem solving assist level: Solves basic problems with no assist  Memory Memory assist level: More than reasonable amount of time    Pain No/Denies Pain   Therapy/Group: Individual Therapy  [name] 09/21/2014, 2:12 PM

## 2014-09-21 NOTE — Progress Notes (Signed)
Physical Therapy Session Note  Patient Details  Name: Mark Gallagher MRN: [id number] Date of Birth: October 07, 1970  Today's Date: 09/21/2014 PT Individual Time: 0900-1000 PT Individual Time Calculation (min): 60 min   Short Term Goals: Week 1:  PT Short Term Goal 1 (Week 1): Pt will perform bed mobility with min A PT Short Term Goal 2 (Week 1): Pt will perform bed <> chair transfers mod A of one person PT Short Term Goal 3 (Week 1): Pt will perform w/c mobility x 150' with supervision in controlled environment PT Short Term Goal 4 (Week 1): Pt will perform gait x 100' with LRAD and mod A of one person PT Short Term Goal 5 (Week 1): Pt will negotiate 4 steps with 2 rails and mod A of one person  Skilled Therapeutic Interventions/Progress Updates:   Session focused wheelchair mobility in controlled environment using BUE with supervision and more than reasonable time due to inefficient steering/path corrections, neuromuscular re-education with sit <> stand transfers from raised mat working in squat position with clothespin reaching activity using alternating UE with emphasis on anterior weight shift and decreasing posterior pushing using BLE against surface with max multimodal cues and up to max A to facilitate movement, functional ambulation using heavy duty RW x 200 ft with min tactile cuing at L quad with no buckling noted and intermittent knee hyperextension/instability, and patient/family education with patient's sister regarding focus of therapy and goals. Patient was able to transfer sit > stand from wheelchair with max A of one person, improved from +2A required yesterday. Patient left sitting in recliner with BLE elevated, needs within reach, and sister in room.   Therapy Documentation Precautions:  Precautions Precautions: Cervical Precaution Comments: [place] J cervical immobilizer Required Braces or Orthoses: Cervical Brace Cervical Brace: Hard collar, At all times Restrictions Weight  Bearing Restrictions: No Pain: Pain Assessment Pain Assessment: 0-10 Pain Score: 3  Pain Type: Acute pain Pain Location: Neck Pain Orientation: Posterior Pain Descriptors / Indicators: Aching Pain Onset: On-going Pain Intervention(s): Rest   Function:  Toileting Toileting             Bed Mobility Roll left and right activity        Sit to lying activity        Lying to sitting activity        Mobility details     Transfers Sit to stand transfer   Sit to stand assist level: Maximal assist (Pt 25 - 49%/lift and lower) Sit to stand assistive device: Armrests;Walker  Chair/bed transfer   Chair/bed transfer method: Stand pivot Chair/bed transfer assist level: Maximal assist (Pt 25 - 49%/lift and lower) Chair/bed transfer assistive device: Walker;Armrests   Chair/bed transfer details: Verbal cues for precautions/safety;Verbal cues for technique;Tactile cues for weight bearing   Personnel officer device: Walker-rolling Max distance: 200 ft Assist level: Touching or steadying assistance (Pt > 75%)  Walk 10 feet activity   Assist level: Touching or steadying assistance (Pt > 75%)  Walk 50 feet with 2 turns activity   Assist level: Touching or steadying assistance (Pt > 75%)  Walk 150 feet activity   Assist level: Touching or steadying assistance (Pt > 75%)  Walk 10 feet on uneven surfaces activity      Stairs  Walk up/down 1 step activity        Walk up/down 4 steps activity      Walk up/down 12 steps activity      Pick up small objects from floor      Wheelchair   Type: Manual Max wheelchair distance: 200 ft Assist Level: Supervision or verbal cues  Wheel 50 feet with 2 turns activity   Assist Level: Supervision or verbal cues  Wheel 150 feet activity   Assist Level: Supervision or verbal cues   Cognition Comprehension Comprehension assist level: Follows  complex conversation/direction with extra time/assistive device  Expression Expression assist level: Expresses complex ideas: With extra time/assistive device  Social Interaction Social Interaction assist level: Interacts appropriately with others with medication or extra time (anti-anxiety, antidepressant).  Problem Solving Problem solving assist level: Solves basic problems with no assist  Memory Memory assist level: More than reasonable amount of time    Therapy/Group: Individual Therapy  Laretta Alstrom 09/21/2014, 10:19 AM

## 2014-09-21 NOTE — Progress Notes (Signed)
Occupational Therapy Session Note  Patient Details  Name: Mark Gallagher MRN: [id number] Date of Birth: 17-Sep-1970  Today's Date: 09/21/2014 OT Individual Time: 1500-1535 OT Individual Time Calculation (min): 35 min    Skilled Therapeutic Interventions/Progress Updates:    Pt worked on sit to stand transitions from elevated therapy mat during session.  Pt with decreased timing of hip and knee extension during transitions both to standing and to sitting.  Pt with increased posterior lean during attempted standing.  Performed multiple sit to stand transitions with hands on knees with mod facilitation needed.  Transferred back to the wheelchair at end of session and returned back to room.  Pt's sister also present for session.    Therapy Documentation Precautions:  Precautions Precautions: Cervical Precaution Comments: [place] J cervical immobilizer Required Braces or Orthoses: Cervical Brace Cervical Brace: Hard collar, At all times Restrictions Weight Bearing Restrictions: No  Pain: Pain Assessment Pain Assessment: No/denies pain ADL:  Function:                 Cognition Comprehension Comprehension assist level: Follows complex conversation/direction with extra time/assistive device  Expression Expression assist level: Expresses complex ideas: With extra time/assistive device  Social Interaction Social Interaction assist level: Interacts appropriately with others with medication or extra time (anti-anxiety, antidepressant).  Problem Solving Problem solving assist level: Solves basic problems with no assist  Memory Memory assist level: More than reasonable amount of time    Therapy/Group: Individual Therapy  Davette Nugent OTR/L 09/21/2014, 3:51 PM

## 2014-09-22 ENCOUNTER — Inpatient Hospital Stay (HOSPITAL_COMMUNITY): Payer: Managed Care, Other (non HMO) | Admitting: Occupational Therapy

## 2014-09-22 ENCOUNTER — Inpatient Hospital Stay (HOSPITAL_COMMUNITY): Payer: Managed Care, Other (non HMO)

## 2014-09-22 ENCOUNTER — Inpatient Hospital Stay (HOSPITAL_COMMUNITY): Payer: Managed Care, Other (non HMO) | Admitting: Speech Pathology

## 2014-09-22 LAB — CBC
HCT: 28.1 % — ABNORMAL LOW (ref 39.0–52.0)
Hemoglobin: 9.1 g/dL — ABNORMAL LOW (ref 13.0–17.0)
MCH: 30.8 pg (ref 26.0–34.0)
MCHC: 32.4 g/dL (ref 30.0–36.0)
MCV: 95.3 fL (ref 78.0–100.0)
Platelets: 271 10*3/uL (ref 150–400)
RBC: 2.95 MIL/uL — ABNORMAL LOW (ref 4.22–5.81)
RDW: 14.2 % (ref 11.5–15.5)
WBC: 4.7 10*3/uL (ref 4.0–10.5)

## 2014-09-22 NOTE — Progress Notes (Signed)
Physical Therapy Session Note  Patient Details  Name: Mark Gallagher MRN: [id number] Date of Birth: February 28, 1970  Today's Date: 09/22/2014 PT Individual Time: 1100-1200 PT Individual Time Calculation (min): 60 min   Short Term Goals: Week 1:  PT Short Term Goal 1 (Week 1): Pt will perform bed mobility with min A PT Short Term Goal 2 (Week 1): Pt will perform bed <> chair transfers mod A of one person PT Short Term Goal 3 (Week 1): Pt will perform w/c mobility x 150' with supervision in controlled environment PT Short Term Goal 4 (Week 1): Pt will perform gait x 100' with LRAD and mod A of one person PT Short Term Goal 5 (Week 1): Pt will negotiate 4 steps with 2 rails and mod A of one person  Skilled Therapeutic Interventions/Progress Updates:   Session focused on education on edema control and positioning while in the chair/OOB, functional transfer training with RW (mod A for sit to stands), neuro re-ed for sit to stand technique with focus on eccentric control and not hyperextending knees to push off from bed during sit to stand x 5 reps each at 23 and 24" height with min A/close S and facilitation for weightshift, gait training to/from therapy gym with focus on knee control in stance phase on L with cues for heel strike and step length at min A level, stair negotiation for home entry practice (see details below) with min to mod A and verbal cues for which foot to lead with, and recommended to get measurements of height of furniture at parent's home to determine ease for transfers especially due to pt's tall height. Pt very proactive and asking great questions in regards to d/c planning and progress towards goals. Pt making great gains.   Therapy Documentation Precautions:  Precautions Precautions: Cervical Precaution Comments: [place] J cervical immobilizer Required Braces or Orthoses: Cervical Brace Cervical Brace: Hard collar, At all times Restrictions Weight Bearing Restrictions:  No  Pain:  Reports some L pectoral pain - using heat. Suggested trial with ice as well.   Function:    Transfers Sit to stand transfer   Sit to stand assist level: Moderate assist (Pt 50 - 74%/lift 2 legs/lift or lower) Sit to stand assistive device: Armrests;Walker  Chair/bed transfer   Chair/bed transfer method: Ambulatory Chair/bed transfer assist level: Moderate assist (Pt 50 - 74%/lift or lower) Chair/bed transfer assistive device: Walker;Armrests   Chair/bed transfer details: Verbal cues for precautions/safety;Verbal cues for technique;Manual facilitation for weight shifting;Verbal cues for safe use of DME/AE   Toilet transfer                Car transfer          Locomotion Ambulation   Assistive device: Walker-rolling Max distance: 200 ft Assist level: Touching or steadying assistance (Pt > 75%)  Walk 10 feet activity   Assist level: Touching or steadying assistance (Pt > 75%)  Walk 50 feet with 2 turns activity   Assist level: Touching or steadying assistance (Pt > 75%)  Walk 150 feet activity   Assist level: Touching or steadying assistance (Pt > 75%)  Walk 10 feet on uneven surfaces activity      Stairs   Stairs assistive device: 2 hand rails Max number of stairs: 4 Stairs assist level: Moderate assist (Pt 50 - 74%)  Walk up/down 1 step activity     Walk up/down 1 step (curb) assist level: Touching or steadying assistance (Pt > 75%)  Walk up/down 4  steps activity   Walk up/down 4 steps assist level: Moderate assist (Pt 50 - 74%)  Walk up/down 12 steps activity      Pick up small objects from floor      Wheelchair          Wheel 50 feet with 2 turns activity      Wheel 150 feet activity       Cognition Comprehension    Expression    Social Interaction    Problem Solving    Memory      Therapy/Group: Individual Therapy  Canary Brim Ivory Broad, PT, DPT  09/22/2014, 12:29 PM

## 2014-09-22 NOTE — Progress Notes (Signed)
Occupational Therapy Session Note  Patient Details  Name: Mark Gallagher MRN: [id number] Date of Birth: 09/06/1970  Today's Date: 09/22/2014 OT Individual Time: 0800-0900  1st session:                                     1400-1500  (60 min)        2nd session OT Individual Time Calculation (min): 60 min    Short Term Goals: Week 1:  OT Short Term Goal 1 (Week 1): Pt will complete UB dressing with set-up OT Short Term Goal 2 (Week 1): Pt will complete toilet transfer with max A OT Short Term Goal 3 (Week 1): Pt will complete toileting task with steadying assist OT Short Term Goal 4 (Week 1): Pt will complete LB dressing with min A. Week 2:     Skilled Therapeutic Interventions/Progress Updates:    1st session:  Addressed bathing and dressing at shower level.  Pt. Was lying in bed.  Went from supine to sit with min assist. Pt. Sat EOB and engaged in donning footies.  SEE Below for levels.  Pt. Ambulated with RW to shower stall.  Transferred to shower bench.and bathed self with sit to stand with mod assist.  Pt completed dressing at tub bench level with increased time for reaching feet.  Pt. Ambulated back to wc and left with all needs in reach.         2nd session:  Addressed sit to stand from hosp bed, wc, tub bench, regular bed with 10 inch raise.    Pt. Able to go from sit to stand with min assist form 22 inch hard surface bathtub bench.  Needed mod assist from soft surface.sofa.  Rased bed toto 29 inch height and pt was supervision.  SEE below for levels    Therapy Documentation Precautions:  Precautions Precautions: Cervical Precaution Comments: [place] J cervical immobilizer Required Braces or Orthoses: Cervical Brace Cervical Brace: Hard collar, At all times Restrictions Weight Bearing Restrictions: No      Pain: Pain Assessment Pain Assessment: No/denies pain Pain Score: 2            Function:   Eating Eating   Eating Assist Level: No help, No cues            Grooming Oral Care,Brush Teeth, Clean Dentures Activity:      Assist Level: Set up   Set up : To obtain items  Wash, Rinse, Dry Face Activity   Assist Level: Set up   Set up : To obtain items  Wash, Rinse, Dry Hands Activity   Assist Level: Set up      Brush, Comb Hair Activity   Assist Level: Set up to obtain items    Shave Activity Shave activity did not occur: Safety/medical concerns        Apply Makeup Activity Apply makeup activity did not occur: Patient does not wear makeup                                                           Bathing Bathing position   Position: Sitting EOB  Bathing parts Body parts bathed by patient: Right arm;Left arm;Chest;Abdomen;Front perineal area;Right upper leg;Left upper leg;Right lower  leg;Left lower leg Body parts bathed by helper: Back;Buttocks  Bathing assist Assist Level: Touching or steadying assistance(Pt > 75%) Assistive Device Comment: LH Sponge     Upper Body Dressing/Undressing Upper body dressing   What is the patient wearing?: Pull over shirt/dress     Pull over shirt/dress - Perfomed by patient: Thread/unthread right sleeve;Thread/unthread left sleeve;Put head through opening;Pull shirt over trunk Pull over shirt/dress - Perfomed by helper: Put head through opening;Pull shirt over trunk        Upper body assist Assist Level: Set up   Set up : To obtain clothing/put away;To apply TLSO, cervical collar   Lower Body Dressing/Undressing Lower body dressing   What is the patient wearing?: Underwear;Pants;Socks Underwear - Performed by patient: Thread/unthread right underwear leg;Thread/unthread left underwear leg;Pull underwear up/down   Pants- Performed by patient: Thread/unthread right pants leg;Thread/unthread left pants leg;Pull pants up/down         Socks - Performed by helper: Don/doff right sock;Don/doff left sock           TED Hose - Performed by helper: Don/doff right TED hose;Don/doff left TED  hose  Lower body assist         Toileting Toileting          Toileting assist      Bed Mobility Roll left and right activity        Sit to lying activity        Lying to sitting activity        Mobility details      Transfers Sit to stand transfer        Chair/bed transfer              Toilet transfer   Toilet transfer assistive device: Elevated toilet seat/BSC over toilet;Walker       Assist level to toilet: Moderate assist (Pt 50 - 74%/lift or lower) Assist level from toilet: Moderate assist (Pt 50 - 74%/lift or lower)  Tub/shower transfer   Tub/shower assistive device: Grab bars;Walk in shower;Tub transfer bench   Assist level into tub: Touching or steadying assistance (Pt > 75%/lift 1 leg) Assist level out of tub: Touching or steadying assistance (Pt > 75%)   Cognition Comprehension Comprehension assist level: Follows complex conversation/direction with no assist  Expression Expression assist level: Expresses complex 90% of the time/cues < 10% of the time  Social Interaction Social Interaction assist level: Interacts appropriately with others with medication or extra time (anti-anxiety, antidepressant).  Problem Solving Problem solving assist level: Solves basic problems with no assist  Memory Memory assist level: More than reasonable amount of time    Therapy/Group: Individual Therapy  Lisa Roca 09/22/2014, 5:52 PM

## 2014-09-22 NOTE — Progress Notes (Signed)
[facility]     PROGRESS NOTE    Subjective/Complaints: Left shoulder feels better. Slept well last night---rehab tired him out!! Wore ACE's all night.  ROS: Pt denies fever, rash/itching, headache, blurred or double vision, nausea, vomiting, abdominal pain, diarrhea, chest pain, shortness of breath, palpitations, dysuria, dizziness, neck or back pain, bleeding, anxiety, or depression   Objective: Vital Signs: Blood pressure 107/59, pulse 72, temperature 97.6 F (36.4 C), temperature source Oral, resp. rate 18, height 6\' 8"  (2.032 m), weight 121.292 kg (267 lb 6.4 oz), SpO2 96 %. No results found.  Recent Labs  09/21/14 0406 09/22/14 0450  WBC 4.7 4.7  HGB 9.3* 9.1*  HCT 28.4* 28.1*  PLT 251 271   No results for input(s): NA, K, CL, GLUCOSE, BUN, CREATININE, CALCIUM in the last 72 hours.  Invalid input(s): CO CBG (last 3)  No results for input(s): GLUCAP in the last 72 hours.  Wt Readings from Last 3 Encounters:  09/20/14 121.292 kg (267 lb 6.4 oz)  08/28/14 109.997 kg (242 lb 8 oz)  08/23/14 113.399 kg (250 lb)    Physical Exam:  Constitutional: He is oriented to person, place, and time. He appears well-developed and well-nourished. Large frame/head HENT: oral mucosa pink and moist Head: Atraumatic.  Eyes: Conjunctivae and EOM are normal. Pupils are equal, round, and reactive to light. Right eye exhibits no discharge.  Neck:  Cervical collar in place  Cardiovascular: Normal rate and regular rhythm.  No murmur heard. Respiratory: Effort normal and breath sounds normal. No respiratory distress. He has no wheezes. He exhibits no tenderness.  GI: Soft. Bowel sounds are normal. He exhibits no distension. There is no tenderness.  Musculoskeletal: He exhibits edema (1+ pedal edema bilaterally). Trace to no edema left hand Ecchymosis left 4 th toe and left lateral malleolus. Trace to 1+ edema left foot and left hand improved Pain  over left pec with palpation is improved Neurological: He is alert and oriented to person, place, and time. RUE: 4+/5 deltoid, bicep, tricep, HI. LUE: 4/5 delt, bicep, tricep, 4-wrist and HI. Positive left pronator drift. Sensation 1+/2 LUE and LLE. RLE: 4- HF, 4/5 KE and ADF/APF. LLE: 3+HF and KE and 1/5 ADF/APF. Sensation 1++/2 left foot to LT and pain.  Able to follow commands without difficulty. Soft voice still with oral secretions.  Skin: Skin is warm and dry.  Psychiatric: He has a normal mood and affect. His behavior is normal. Judgment and thought content normal.    Assessment/Plan: 1. Functional deficits secondary to C6 SCI  which require 3+ hours per day of interdisciplinary therapy in a comprehensive inpatient rehab setting. Physiatrist is providing close team supervision and 24 hour management of active medical problems listed below. Physiatrist and rehab team continue to assess barriers to discharge/monitor patient progress toward functional and medical goals.  Function:  Bathing Bathing position   Position: Shower  Bathing parts Body parts bathed by patient: Right arm, Left arm, Chest, Abdomen, Front perineal area, Right upper leg, Left upper leg, Right lower leg, Left lower leg Body parts bathed by helper: Back, Buttocks  Bathing assist Assist Level: Touching or steadying assistance(Pt > 75%) Assistive Device Comment: LH Sponge    Upper Body Dressing/Undressing Upper body dressing   What is the patient wearing?: Pull over shirt/dress     Pull over shirt/dress - Perfomed by patient: Thread/unthread right sleeve, Thread/unthread left sleeve, Put head through opening, Pull shirt over trunk Pull over shirt/dress - Perfomed  by helper: Put head through opening, Pull shirt over trunk        Upper body assist Assist Level: Set up   Set up : To obtain clothing/put away, To apply TLSO, cervical collar  Lower Body Dressing/Undressing Lower body dressing   What is the  patient wearing?: Underwear, Pants, Socks Underwear - Performed by patient: Thread/unthread right underwear leg, Thread/unthread left underwear leg, Pull underwear up/down   Pants- Performed by patient: Thread/unthread right pants leg, Thread/unthread left pants leg, Pull pants up/down         Socks - Performed by helper: Don/doff right sock, Don/doff left sock           TED Hose - Performed by helper: Don/doff right TED hose, Don/doff left TED hose  Lower body assist Assist Level: Touching or steadying assistance (Pt > 75%)      Toileting Toileting   Toileting steps completed by patient: Performs perineal hygiene Toileting steps completed by helper: Adjust clothing after toileting    Toileting assist Assist level: Two helpers (per [employer], NT)   Transfers Chair/bed transfer   Chair/bed transfer method: Stand pivot Chair/bed transfer assist level: Maximal assist (Pt 25 - 49%/lift and lower) Chair/bed transfer assistive device: Walker, Air cabin crew     Max distance: 200 ft Assist level: Touching or steadying assistance (Pt > 75%)   Wheelchair   Type: Manual Max wheelchair distance: 200 ft Assist Level: Supervision or verbal cues  Cognition Comprehension Comprehension assist level: Follows complex conversation/direction with no assist  Expression Expression assist level: Expresses complex 90% of the time/cues < 10% of the time  Social Interaction Social Interaction assist level: Interacts appropriately with others with medication or extra time (anti-anxiety, antidepressant).  Problem Solving Problem solving assist level: Solves basic problems with no assist  Memory Memory assist level: More than reasonable amount of time    Medical Problem List and Plan: 1. Functional deficits secondary to C6 SCI with incomplete myelopathy 2. DVT Prophylaxis/Anticoagulation: Pharmaceutical: Lovenox indicated 3. Pain Management: Will continue oxycodone prn.    -  kpad prn  -  Sports cream to left pec prn -discussed appropriate technique with mobilty/transfers at pertains to his upper extremities 4. Mood: LCSW to follow for evaluation and support.  5. Neuropsych: This patient is capable of making decisions on his own behalf. 6. Skin/Wound Care: Monitor wound daily. Continue wound care dialy.  7. Fluids/Electrolytes/Nutrition: Monitor I/O. Encourage po. Sodium sl low but otherwise bmet normal 8. ABLA: hgb stable at 9.3 to 9.1. continue fe++ supplement, need stool for ob still  -check serially----check again monday 9. Adrenal insufficiency: On cortef bid for supplement. 10 Hypothyroidism: On synthroid.  11. Hypogonadism: Depotestosterone every 8 weeks--follow up with patient regarding next dose  12. 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 To follow up with cardiology after discharge--currently in normal rhythm with rate control 13. H/o Acromegaly: Treated with Sandostatin? 14. Dysphagia: likely related to surgery/collar---D3 diet still   -speech eval/treatment appreciated  15. Edema: TEDS  -continue ACE wraps and elevation   -improved  LOS (Days) 4 A FACE TO FACE EVALUATION WAS PERFORMED  SWARTZ,ZACHARY T 09/22/2014 11:11 AM

## 2014-09-22 NOTE — Progress Notes (Signed)
Speech Language Pathology Daily Session Note  Patient Details  Name: Mark Gallagher MRN: [id number] Date of Birth: 10/16/1970  Today's Date: 09/22/2014 SLP Individual Time: 1300-1400 SLP Individual Time Calculation (min): 60 min  Short Term Goals: Week 1: SLP Short Term Goal 1 (Week 1): Patient will consume current diet with minimal overt s/s of aspiration with supervision verbal cues for use of swallowing compensatory strategies.  SLP Short Term Goal 2 (Week 1): Patient will demonstrate efficient masticaiton of regular textures without overt s/s of aspiration with supervision over 3 sessions prior to upgrade.   Skilled Therapeutic Interventions: Skilled treatment session focused on dysphagia goals. Upon arrival, patient was awake while sitting upright in the wheelchair and agreeable to participate in treatment session. Patient consumed lunch meal of Dys. 3 textures with thin liquids via straw without overt s/s of aspiration and was Mod I for use of swallowing compensatory strategies. Patient reported he felt he would be "ready" for trials of regular textures at the first of the week, therefore, will administer trials of regular textures at next scheduled appointment.  Patient asking appropriate questions in regards to progress and f/u therapy, all questions were answered at this time. Patient left upright in wheelchair with all needs within reach and sister present. Continue with current plan of care.   Function:  Eating Eating   Modified Consistency Diet: Yes Eating Assist Level: No help, No cues           Cognition Comprehension Comprehension assist level: Follows complex conversation/direction with no assist  Expression   Expression assist level: Expresses complex 90% of the time/cues < 10% of the time  Social Interaction Social Interaction assist level: Interacts appropriately with others with medication or extra time (anti-anxiety, antidepressant).  Problem Solving Problem  solving assist level: Solves basic problems with no assist  Memory Memory assist level: More than reasonable amount of time    Pain Pain Assessment Pain Assessment: No/denies pain  Therapy/Group: Individual Therapy  Sally Reimers 09/22/2014, 4:00 PM

## 2014-09-23 ENCOUNTER — Inpatient Hospital Stay (HOSPITAL_COMMUNITY): Payer: Managed Care, Other (non HMO) | Admitting: Occupational Therapy

## 2014-09-23 ENCOUNTER — Inpatient Hospital Stay (HOSPITAL_COMMUNITY): Payer: Managed Care, Other (non HMO) | Admitting: *Deleted

## 2014-09-23 DIAGNOSIS — M4712 Other spondylosis with myelopathy, cervical region: Principal | ICD-10-CM

## 2014-09-23 LAB — OCCULT BLOOD X 1 CARD TO LAB, STOOL: Fecal Occult Bld: NEGATIVE

## 2014-09-23 NOTE — Plan of Care (Signed)
Problem: SCI BOWEL ELIMINATION Goal: RH STG SCI MANAGE BOWEL WITH MEDICATION WITH ASSISTANCE STG SCI Manage bowel with medication with Mod I assistance.  Outcome: Progressing Patient denied use of suppository HS but agreed to have in AM since [facility] 8/29.

## 2014-09-23 NOTE — Progress Notes (Addendum)
RN went in to check on patient at approximately 1420, called his name to see if he was awake but he did not respond and was sound asleep. 1422 he called and Rn told him that she will be with him shortly. In less than 15 seconds he called again and ask for the restroom, Rn told him that she will be with him after she was done with a patient in less than 5 minutes as nobody was available to assist. Patient then had an incontinent episode and was furious claiming that he had been waiting for over 40 minutes. RN apologized and helped patient get cleaned.

## 2014-09-23 NOTE — Progress Notes (Signed)
Physical Therapy Session Note  Patient Details  Name: Mark Gallagher MRN: [id number] Date of Birth: 23-Sep-1970  Today's Date: 09/23/2014 PT Individual Time: 0800-0900 PT Individual Time Calculation (min): 60 min     Skilled Therapeutic Interventions/Progress Updates:  Session I 0800-0900 (60 min )  Patient in the bathroom at the beginning of the session, with standing frame assisted to stand, totally dependant for hygiene. Assisted patient in donning socks and shoes.  Training in gait to and from gym with FWW and min A and manual guidance for step length and to decrease scissoring.  Sit to stand training from mat positioned at 23 and 22 inches with and w/o use of UE. Facilitation of weight shifting and quad activation by going half way up and stopping in half squat position, followed by full standing.  Gross motor control training for b LE.  Session II 1450-1550 (60 min )  Session delayed due to patient having an incontinence episode and needed to be cleaned by nursing staff.  Training in gait with RW to and from gym with decreased speed but increased accuracy of steps and decreased buckling of L knee.  Focus of this session was to increase ability to transfer in and out of bed-trained in an apartment x 2 with min A and cues for technique when standing up from bed.  Training in sit to stand from mat placed at 21 and 20 inches with 2 yoga blocks to push up from , -min to CGA with repeated practice. Triceps dips 2 x 10 to increase strength and ability to push up needed for improved independence in transfers. Educated patient in isometric exercises for neck. Patient returned to room with all needs within reach.   Therapy Documentation Precautions:  Precautions Precautions: Cervical Precaution Comments: [place] J cervical immobilizer Required Braces or Orthoses: Cervical Brace Cervical Brace: Hard collar, At all times Restrictions Weight Bearing Restrictions: No Pain: Pain Assessment Pain  Assessment: 0-10 Pain Score: 2  Pain Type: Acute pain Pain Location: Neck Pain Orientation: Left Pain Descriptors / Indicators: Aching Pain Frequency: Intermittent Pain Onset: On-going Patients Stated Pain Goal: 1 Pain Intervention(s): Medication (See eMAR)                    Therapy/Group: Individual Therapy  [name] 09/23/2014, 12:16 PM

## 2014-09-23 NOTE — Progress Notes (Signed)
Occupational Therapy Session Note  Patient Details  Name: Mark Gallagher MRN: [id number] Date of Birth: 1970-02-19  Today's Date: 09/23/2014 OT Individual Time: 1030-1200 OT Individual Time Calculation (min): 90 min    Short Term Goals: Week 1:  OT Short Term Goal 1 (Week 1): Pt will complete UB dressing with set-up OT Short Term Goal 2 (Week 1): Pt will complete toilet transfer with max A OT Short Term Goal 3 (Week 1): Pt will complete toileting task with steadying assist OT Short Term Goal 4 (Week 1): Pt will complete LB dressing with min A.  Skilled Therapeutic Interventions/Progress Updates:  Upon entering the room, pt seated in wheelchair awaiting therapist. Pt with 2/10 c/o pain in neck this session. Skilled OT intervention with focus on self care retraining, functional mobility/transfers, and sit <>stands. Pt ambulating 20' from wheelchair to shower with min A and use of RW this session. Pt requiring min verbal cues for hand placement,safety,and proper technique for transfers this session. Pt performed bathing from shower level while seated on TTB. See below for details. Pt sitting on edge of TTB to don shorts by crossing ankles over to knee in order to maintain restrictions. Pt ambulated back to wheelchair in same manner as above. Pt completed further dressing from wheelchair. Sit <>stand from wheelchair x 3 this session with mod A , RW, and min verbal cues for proper technique. Pt remained seated in wheelchair at end of session with TEDs donned and B LEs elevated with leg rests. Call bell and all other needed items within reach.   Therapy Documentation Precautions:  Precautions Precautions: Cervical Precaution Comments: [place] J cervical immobilizer Required Braces or Orthoses: Cervical Brace Cervical Brace: Hard collar, At all times Restrictions Weight Bearing Restrictions: No Pain: Pain Assessment Pain Assessment: 0-10 Pain Score: 2  Pain Type: Acute pain Pain Location:  Neck Pain Orientation: Left Pain Descriptors / Indicators: Aching Pain Frequency: Intermittent Pain Onset: On-going Patients Stated Pain Goal: 1 Pain Intervention(s): Medication (See eMAR)  Function:                                                       Bathing Bathing position   Position: Shower  Bathing parts Body parts bathed by patient: Right arm;Left arm;Chest;Abdomen;Front perineal area;Right upper leg;Left upper leg;Right lower leg;Left lower leg Body parts bathed by helper: Back;Buttocks  Bathing assist Assist Level: Touching or steadying assistance(Pt > 75%) Assistive Device Comment: LH Sponge     Upper Body Dressing/Undressing Upper body dressing   What is the patient wearing?: Pull over shirt/dress     Pull over shirt/dress - Perfomed by patient: Thread/unthread right sleeve;Thread/unthread left sleeve;Put head through opening;Pull shirt over trunk          Upper body assist Assist Level: Set up       Lower Body Dressing/Undressing Lower body dressing   What is the patient wearing?: Pants;Socks     Pants- Performed by patient: Thread/unthread right pants leg;Thread/unthread left pants leg;Pull pants up/down         Socks - Performed by helper: Don/doff right sock;Don/doff left sock           TED Hose - Performed by helper: Don/doff right TED hose;Don/doff left TED hose  Lower body assist Assist Level: Touching or steadying assistance (Pt > 75%)  Transfers Sit to stand transfer   Sit to stand assist level: Moderate assist (Pt 50 - 74%/lift 2 legs/lift or lower) Sit to stand assistive device: Armrests;Walker  Chair/bed Brewing technologist Comprehension Comprehension assist level: Follows complex conversation/direction with no assist  Expression Expression assist level: Expresses complex 90% of the time/cues < 10% of the time  Social  Interaction Social Interaction assist level: Interacts appropriately with others with medication or extra time (anti-anxiety, antidepressant).  Problem Solving Problem solving assist level: Solves basic problems with no assist  Memory      Therapy/Group: Individual Therapy  Mark Gallagher 09/23/2014, 12:45 PM

## 2014-09-23 NOTE — Progress Notes (Signed)
  [facility] PHYSICAL MEDICINE & REHABILITATION     PROGRESS NOTE    Subjective/Complaints: Mark Gallagher has a lot of ss this morning. He wonders why yesterday evening his right foot tingles at times.He wonders whether his left arm is swelling. Overall he is feeling quite well. Appetite is normal.   Objective: Vital Signs: Blood pressure 113/72, pulse 72, temperature 98.5 F (36.9 C), temperature source Oral, resp. rate 18, height 6\' 8"  (2.032 m), weight 267 lb 6.4 oz (121.292 kg), SpO2 96 %.  o acute distress. Acromegaly features. Chest clear to auscultation Cardiac exam S1 and S2 are regular Abdominal exam active bowel sounds, soft Extremities trace edema at the ankles. No edema ofthe upper extremities.   Assessment/Plan: 1. Functional deficits secondary to C6 SCI    Medical Problem List and Plan: 1. Functional deficits secondary to C6 SCI with incomplete myelopathy 2. DVT Prophylaxis/Anticoagulation: Pharmaceutical: Lovenox indicated 3. Pain Management: continue current regimen.  4. Mood: LCSW to follow for evaluation and support.  5. Neuropsych: This patient is capable of making decisions on his own behalf. 6. Skin/Wound Care: Monitor wound daily. Continue wound care dialy.  7. Fluids/Electrolytes/Nutrition: Monitor I/O. Encourage po. Sodium sl low but otherwise bmet normal 8. ABLA: hgb stable  Lab Results  Component Value Date   HGB 9.1* 09/22/2014    9. Adrenal insufficiency: On cortef bid for supplement. 10 Hypothyroidism: On synthroid.  11. Hypogonadism: Depotestosterone every 8 weeks--follow up with patient regarding next dose  12. 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 To follow up with cardiology after discharge--currently in normal rhythm with rate control 13. H/o Acromegaly: Treated with Sandostatin? 14. Dysphagia: likely related to surgery/collar---D3 diet still   -speech eval/treatment appreciated  15. Edema: minimal edema at this time.  LOS (Days) 5 A  FACE TO FACE EVALUATION WAS PERFORMED  Mark Gallagher 09/23/2014 6:50 AM

## 2014-09-24 ENCOUNTER — Inpatient Hospital Stay (HOSPITAL_COMMUNITY): Payer: Managed Care, Other (non HMO)

## 2014-09-24 NOTE — Plan of Care (Signed)
Problem: RH SKIN INTEGRITY Goal: RH STG ABLE TO PERFORM INCISION/WOUND CARE W/ASSISTANCE STG Able To Perform Incision/Wound Care With total Assistance from caregiver Outcome: Progressing Staff provide care for incision under [place].

## 2014-09-24 NOTE — Plan of Care (Signed)
Problem: SCI BLADDER ELIMINATION Goal: RH STG MANAGE BLADDER WITH ASSISTANCE STG Manage Bladder With Mod I Assistance  Outcome: Progressing Staff hold urinal in place.

## 2014-09-24 NOTE — Progress Notes (Signed)
  [place] PHYSICAL MEDICINE & REHABILITATION     PROGRESS NOTE    Subjective/Complaints: Patient feels well today. He is eating well.No specific complaints.   Objective: Vital Signs: Blood pressure 109/74, pulse 71, temperature 98.8 F (37.1 C), temperature source Oral, resp. rate 19, height 6\' 8"  (2.032 m), weight 267 lb 6.4 oz (121.292 kg), SpO2 97 %.  no acute distress. Acromegaly features. Chest clear to auscultation Cardiac exam S1 and S2 are regular Abdominal exam active bowel sounds, soft Extremities Bilateral lower extremities are wrapped this morning.  UNABLE TO PALPATE ANY EDEMA  UNDER THE WRAPPING   Assessment/Plan: 1. Functional deficits secondary to C6 SCI    Medical Problem List and Plan: 1. Functional deficits secondary to C6 SCI with incomplete myelopathy 2. DVT Prophylaxis/Anticoagulation: Pharmaceutical: Lovenox indicated 3. Pain Management: continue current regimen.  4. Mood: LCSW to follow for evaluation and support.  5. Neuropsych: This patient is capable of making decisions on his own behalf. 6. Skin/Wound Care: Monitor wound daily. Continue wound care dialy.  7. Fluids/Electrolytes/Nutrition: Monitor I/O. Encourage po. Sodium sl low but otherwise bmet normal 8. ABLA: hgb stable  Lab Results  Component Value Date   HGB 9.1* 09/22/2014    9. Adrenal insufficiency: On cortef bid for supplement. 10 Hypothyroidism: On synthroid.  11. Hypogonadism: Depotestosterone every 8 weeks--follow up with patient regarding next dose  12. 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 To follow up with cardiology after discharge--currently in normal rhythm with rate control 13. H/o Acromegaly: Treated with Sandostatin? 14. Dysphagia: likely related to surgery/collar---D3 diet still   -speech eval/treatment appreciated  15. Edema: minimal/if any edema at this time.  LOS (Days) 6 A FACE TO FACE EVALUATION WAS PERFORMED  SWORDS,BRUCE HENRY 09/24/2014 8:24 AM

## 2014-09-24 NOTE — Progress Notes (Signed)
Physical Therapy Session Note  Patient Details  Name: Mark Gallagher MRN: [id number] Date of Birth: 27-Nov-1970  Today's Date: 09/24/2014 PT Individual Time: 7356-7014 PT Individual Time Calculation (min): 65 min   Short Term Goals: Week 1:  PT Short Term Goal 1 (Week 1): Pt will perform bed mobility with min A PT Short Term Goal 2 (Week 1): Pt will perform bed <> chair transfers mod A of one person PT Short Term Goal 3 (Week 1): Pt will perform w/c mobility x 150' with supervision in controlled environment PT Short Term Goal 4 (Week 1): Pt will perform gait x 100' with LRAD and mod A of one person PT Short Term Goal 5 (Week 1): Pt will negotiate 4 steps with 2 rails and mod A of one person  Skilled Therapeutic Interventions/Progress Updates:    Session focused on functional transfer training including furniture transfer (low couch in ADL apartment required mod A) and various height surfaces to simulate home environment (measurement sheet for pt's home present in room with lowest at 17 1/2" with highest at 20" (his bed)) transfers, education on recommending to build up surfaces to decrease burden with sit to stands due to tall stature and increase independence/confidence to modify positioning and set-up at home as well, gait training with RW to/from therapy gym and on carpeted surface at min A level > 150' with cues for shorter step length, step through pattern, and quad contraction in stance on L for increased knee stability, and stair training on curb step with RW to simulate home entry with min A and verbal cues for positioning of RW and technique.   Pt reports he plans now to d/c to his home instead of his parent's which will require him to climb a flight of stairs (B handrails) once a day to get to his bedroom. Will benefit from continued stair training for increased independence and safety with this task. Also discussed walking with nursing staff into the bathroom instead of using the [place] at  this point to simulate home environment more - NT also made aware. Pt still unable to fit into his shoes due to edema though it is decreasing. Once has shoes, discussed going outdoors to practice uneven surfaces for gait.   End of session pt returned to supine at S level including transfer back to bed and into supine with extra time. Positioned for comfort and all needs in reach.     Therapy Documentation Precautions:  Precautions Precautions: Cervical Precaution Comments: [place] J cervical immobilizer Required Braces or Orthoses: Cervical Brace Cervical Brace: Hard collar, At all times Restrictions Weight Bearing Restrictions: No   Pain: Neck pain at end of session - request to lay down for relief.    Therapy/Group: Individual Therapy  Canary Brim Ivory Broad, PT, DPT  09/24/2014, 2:45 PM

## 2014-09-25 ENCOUNTER — Inpatient Hospital Stay (HOSPITAL_COMMUNITY): Payer: Managed Care, Other (non HMO) | Admitting: Occupational Therapy

## 2014-09-25 ENCOUNTER — Inpatient Hospital Stay (HOSPITAL_COMMUNITY): Payer: Managed Care, Other (non HMO)

## 2014-09-25 ENCOUNTER — Inpatient Hospital Stay (HOSPITAL_COMMUNITY): Payer: Managed Care, Other (non HMO) | Admitting: Speech Pathology

## 2014-09-25 LAB — CBC
HCT: 28.8 % — ABNORMAL LOW (ref 39.0–52.0)
Hemoglobin: 9.4 g/dL — ABNORMAL LOW (ref 13.0–17.0)
MCH: 31.1 pg (ref 26.0–34.0)
MCHC: 32.6 g/dL (ref 30.0–36.0)
MCV: 95.4 fL (ref 78.0–100.0)
Platelets: 304 10*3/uL (ref 150–400)
RBC: 3.02 MIL/uL — ABNORMAL LOW (ref 4.22–5.81)
RDW: 14.3 % (ref 11.5–15.5)
WBC: 4.1 10*3/uL (ref 4.0–10.5)

## 2014-09-25 NOTE — Progress Notes (Signed)
Mark Gallagher     PROGRESS NOTE    Subjective/Complaints: Swelling better LUE. Needs replacement pads for collar  ROS: Pt denies fever, rash/itching, headache, blurred or double vision, nausea, vomiting, abdominal pain, diarrhea, chest pain, shortness of breath, palpitations, dysuria, dizziness, neck or back pain, bleeding, anxiety, or depression   Objective: Vital Signs: Blood pressure 114/69, pulse 68, temperature 98.7 F (37.1 C), temperature source Oral, resp. rate 18, height 6\' 8"  (2.032 m), weight 121.292 kg (267 lb 6.4 oz), SpO2 98 %. No results found.  Recent Labs  09/25/14 0519  WBC 4.1  HGB 9.4*  HCT 28.8*  PLT 304   No results for input(s): NA, K, CL, GLUCOSE, BUN, CREATININE, CALCIUM in the last 72 hours.  Invalid input(s): CO CBG (last 3)  No results for input(s): GLUCAP in the last 72 hours.  Wt Readings from Last 3 Encounters:  09/20/14 121.292 kg (267 lb 6.4 oz)  08/28/14 109.997 kg (242 lb 8 oz)  08/23/14 113.399 kg (250 lb)    Physical Exam:  Constitutional: Mark Gallagher is oriented to person, place, and time. Mark Gallagher appears well-developed and well-nourished. Large frame/head HENT: oral mucosa pink and moist Head: Atraumatic.  Eyes: Conjunctivae and EOM are normal. Pupils are equal, round, and reactive to light. Right eye exhibits no discharge.  Neck:  Cervical collar in place  Cardiovascular: Normal rate and regular rhythm.  No murmur heard. Respiratory: Effort normal and breath sounds normal. No respiratory distress. Mark Gallagher has no wheezes. Mark Gallagher exhibits no tenderness.  GI: Soft. Bowel sounds are normal. Mark Gallagher exhibits no distension. There is no tenderness.  Musculoskeletal: Mark Gallagher exhibits edema (1+ pedal edema bilaterally). Trace to no edema left hand Ecchymosis left 4th toe and left lateral malleolus. Trace to 1+ edema left foot and left hand improved Pain over left pec with palpation is improved Neurological: Mark Gallagher is alert and  oriented to person, place, and time. RUE: 4+/5 deltoid, bicep, tricep, HI. LUE: 4/5 delt, bicep, tricep, 4-wrist and HI. Positive left pronator drift. Sensation 1+/2 LUE and LLE. RLE: 4- HF, 4/5 KE and ADF/APF. LLE: 3+HF and KE and 1/5 ADF/APF. Sensation 1++/2 left foot to LT and pain.  Able to follow commands without difficulty. Soft voice still with oral secretions.  Skin: Skin is warm and dry.  Psychiatric: Mark Gallagher has a normal mood and affect. His behavior is normal. Judgment and thought content normal.    Assessment/Plan: 1. Functional deficits secondary to C6 SCI  which require 3+ hours per day of interdisciplinary therapy in a comprehensive inpatient rehab setting. Physiatrist is providing close team supervision and 24 hour management of active medical problems listed below. Physiatrist and rehab team continue to assess barriers to discharge/monitor patient progress toward functional and medical goals.  Function:  Bathing Bathing position   Position: Shower  Bathing parts Body parts bathed by patient: Right arm, Left arm, Chest, Abdomen, Front perineal area, Right upper leg, Left upper leg, Right lower leg, Left lower leg Body parts bathed by helper: Buttocks, Back  Bathing assist Assist Level: Touching or steadying assistance(Pt > 75%) Assistive Device Comment: LH Sponge    Upper Body Dressing/Undressing Upper body dressing   What is the patient wearing?: Pull over shirt/dress     Pull over shirt/dress - Perfomed by patient: Thread/unthread right sleeve, Thread/unthread left sleeve, Put head through opening, Pull shirt over trunk Pull over shirt/dress - Perfomed by helper: Put head through opening, Pull shirt over trunk  Upper body assist Assist Level: Set up   Set up : To obtain clothing/put away, To apply TLSO, cervical collar  Lower Body Dressing/Undressing Lower body dressing   What is the patient wearing?: Pants, Socks Underwear - Performed by patient:  Thread/unthread right underwear leg, Thread/unthread left underwear leg, Pull underwear up/down   Pants- Performed by patient: Thread/unthread right pants leg, Thread/unthread left pants leg, Pull pants up/down         Socks - Performed by helper: Don/doff right sock, Don/doff left sock           TED Hose - Performed by helper: Don/doff right TED hose, Don/doff left TED hose  Lower body assist Assist Level: Touching or steadying assistance (Pt > 75%)      Toileting Toileting   Toileting steps completed by patient: Performs perineal hygiene Toileting steps completed by helper: Adjust clothing after toileting    Toileting assist Assist level: Two helpers (per [employer], NT)   Transfers Chair/bed transfer   Chair/bed transfer method: Ambulatory Chair/bed transfer assist level: Touching or steadying assistance (Pt > 75%) Chair/bed transfer assistive device: Walker, Air cabin crew     Max distance: 200 Assist level: Touching or steadying assistance (Pt > 75%)   Wheelchair   Type: Manual Max wheelchair distance: 200 ft Assist Level: Supervision or verbal cues  Cognition Comprehension Comprehension assist level: Follows complex conversation/direction with no assist  Expression Expression assist level: Expresses complex ideas: With no assist  Social Interaction Social Interaction assist level: Interacts appropriately with others - No medications needed.  Problem Solving Problem solving assist level: Solves complex problems: Recognizes & self-corrects  Memory Memory assist level: More than reasonable amount of time    Medical Problem List and Plan: 1. Functional deficits secondary to C6 SCI with incomplete myelopathy  -will contact orthotist about getting replacement pads 2. DVT Prophylaxis/Anticoagulation: Pharmaceutical: Lovenox indicated 3. Pain Management: Will continue oxycodone prn.   -  kpad prn  -  Sports cream to left pec prn -discussed  appropriate technique with mobilty/transfers at pertains to his upper extremities 4. Mood: LCSW to follow for evaluation and support.  5. Neuropsych: This patient is capable of making decisions on his own behalf. 6. Skin/Wound Care: Monitor wound daily. Continue wound care dialy.  7. Fluids/Electrolytes/Nutrition: Monitor I/O. Encourage po. Sodium sl low but otherwise bmet normal 8. ABLA: hgb stable at 9.4. continue fe++ supplement  9. Adrenal insufficiency: On cortef bid for supplement. 10 Hypothyroidism: On synthroid.  11. Hypogonadism: Depotestosterone every 8 weeks--follow up with patient regarding next dose  12. 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 To follow up with cardiology after discharge--currently in normal rhythm with rate control 13. H/o Acromegaly: Treated with Sandostatin? 14. Dysphagia: likely related to surgery/collar---D3 diet still   -speech eval/treatment appreciated  15. Edema: TEDS  -continue ACE wraps and elevation   -improved LUE  -slower in LE's due to size  LOS (Days) 7 A FACE TO FACE EVALUATION WAS PERFORMED  SWARTZ,ZACHARY T 09/25/2014 9:15 AM

## 2014-09-25 NOTE — Progress Notes (Signed)
Speech Language Pathology Daily Session Note  Patient Details  Name: Mark Gallagher MRN: [id number] Date of Birth: 1970-10-19  Today's Date: 09/25/2014 SLP Individual Time: 1300-1400 SLP Individual Time Calculation (min): 60 min  Short Term Goals: Week 1: SLP Short Term Goal 1 (Week 1): Patient will consume current diet with minimal overt s/s of aspiration with supervision verbal cues for use of swallowing compensatory strategies.  SLP Short Term Goal 2 (Week 1): Patient will demonstrate efficient masticaiton of regular textures without overt s/s of aspiration with supervision over 3 sessions prior to upgrade.   Skilled Therapeutic Interventions: Skilled treatment session focused on dysphagia goals. Upon arrival, patient was awake while sitting upright in the wheelchair and agreeable to participate in treatment session. Patient consumed lunch meal of Dys. 3 textures with thin liquids via straw without overt s/s of aspiration and was Mod I for use of swallowing compensatory strategies. Patient also consumed trials of regular textures without overt s/s of aspiration and required intermittent liquid washes to clear oral residue. Recommend trial tray of regular textures prior to upgrade. Patient educated on goals and plan of skilled SLP intervention, he verbalized understanding. Patient left upright in wheelchair with all needs within reach and sister present. Continue with current plan of care.   Function:  Eating Eating   Modified Consistency Diet: Yes Eating Assist Level: Assistive Device Eating Assistive Device Comment: adaptive equipment Eating Set Up Assist For: Opening containers;Cutting food;Applying device (includes dentures)       Cognition Comprehension Comprehension assist level: Follows complex conversation/direction with no assist  Expression   Expression assist level: Expresses complex ideas: With no assist  Social Interaction Social Interaction assist level: Interacts  appropriately with others - No medications needed.  Problem Solving Problem solving assist level: Solves complex problems: Recognizes & self-corrects  Memory Memory assist level: More than reasonable amount of time    Pain No/Denies Pain   Therapy/Group: Individual Therapy  Mark Gallagher 09/25/2014, 3:35 PM

## 2014-09-25 NOTE — Progress Notes (Signed)
Occupational Therapy Session Note  Patient Details  Name: Mark Gallagher MRN: [id number] Date of Birth: 04/13/1970  Today's Date: 09/25/2014 OT Individual Time:  - 0740-900  (80 min)  1st session                                         1115-1150  (35 min)  2nd session      Short Term Goals: Week 1:  OT Short Term Goal 1 (Week 1): Pt will complete UB dressing with set-up OT Short Term Goal 2 (Week 1): Pt will complete toilet transfer with max A OT Short Term Goal 3 (Week 1): Pt will complete toileting task with steadying assist OT Short Term Goal 4 (Week 1): Pt will complete LB dressing with min A. Week 2:    Week 3:     Skilled Therapeutic Interventions/Progress Updates:     1st session:  Pt. Sitting EOB with breakfast tray.  Pt practiced eating with left hand.  Pt.ate 100% with LUE.  He was able to drink from cup with no spills.  Pt.ambulated with RW to shower bench.  Needed cues for stand to sit safely with min assist.  Utilized LH sponge for feet, back with instructional cues.  Instructed pt on using reacher for LB drying and towel on floor for bottoms of feet.  Pt ambulated out to sink.  Stood to [employer] hair using left hand.  Pt. Ambulated back to wc and left with all needs in reach.   2nd session: Instructed patient in handwriting exercises using red gripper adaptation to ink pen.  Pt used red ball on palmer surface for support.  Provided exercises to do when not in therapy.    Therapy Documentation Precautions:  Precautions Precautions: Cervical Precaution Comments: [place] J cervical immobilizer Required Braces or Orthoses: Cervical Brace Cervical Brace: Hard collar, At all times Restrictions Weight Bearing Restrictions: No General:   Vital Signs: TOxygen Therapy SpO2: 98 % O2 Device: Not Delivered Pain:  3/10 neck pain 1st and 2nd session  Pain Assessment Pain Assessment: 0-10 Pain Score: 0-No pain Pain Type: Acute pain Pain Location: Shoulder Pain Orientation:  Left Pain Descriptors / Indicators: Aching Pain Frequency: Intermittent Pain Onset: With Activity Patients Stated Pain Goal: 3 Pain Intervention(s): Medication (See eMAR) ADL:   Exercises:   Other Treatments:    Function:   Eating Eating   Eating Assist Level: More than reasonable amount of time Eating Assistive Device Comment: red gripper (red gripper) Eating Set Up Assist For: Opening containers;Cutting food;Applying device (includes dentures)       Grooming Oral Care,Brush Teeth, Clean Dentures Activity:             Wash, Rinse, Dry Face Activity          Wash, Rinse, Dry Hands Activity          Brush, Comb Hair Activity        Shave Activity          Apply Makeup Activity                                                             Bathing Bathing position   Position: Shower  Bathing parts Body parts bathed by patient: Right arm;Left arm;Chest;Abdomen;Front perineal area;Right upper leg;Left upper leg;Right lower leg;Left lower leg Body parts bathed by helper: Buttocks;Back  Bathing assist Assist Level: Touching or steadying assistance(Pt > 75%) Assistive Device Comment: LH Sponge     Upper Body Dressing/Undressing Upper body dressing   What is the patient wearing?: Pull over shirt/dress     Pull over shirt/dress - Perfomed by patient: Thread/unthread right sleeve;Thread/unthread left sleeve;Put head through opening;Pull shirt over trunk          Upper body assist Assist Level: Set up   Set up : To obtain clothing/put away;To apply TLSO, cervical collar   Lower Body Dressing/Undressing Lower body dressing                                  Lower body assist         Toileting Toileting          Toileting assist      Bed Mobility Roll left and right activity        Sit to lying activity        Lying to sitting activity        Mobility details      Transfers Sit to stand transfer   Sit to stand assist  level: Touching or steadying assistance (Pt > 75%/lift 1 leg) (Simultaneous filing. User may not have seen previous data.) Sit to stand assistive device: Armrests;Walker  Chair/bed transfer   Chair/bed transfer method: Ambulatory Chair/bed transfer assist level: Touching or steadying assistance (Pt > 75%) Chair/bed transfer assistive device: Walker;Armrests   Chair/bed transfer details: Verbal cues for safe use of DME/AE  Toilet transfer                Tub/shower transfer   Tub/shower assistive device: Grab bars;Walk in shower;Tub transfer bench   Assist level into tub: Touching or steadying assistance (Pt > 75%/lift 1 leg) Assist level out of tub: Touching or steadying assistance (Pt > 75%)   Cognition Comprehension Comprehension assist level: Follows complex conversation/direction with no assist  Expression Expression assist level: Expresses complex ideas: With no assist  Social Interaction Social Interaction assist level: Interacts appropriately with others - No medications needed.  Problem Solving Problem solving assist level: Solves complex problems: Recognizes & self-corrects  Memory Memory assist level: More than reasonable amount of time    Therapy/Group: Individual Therapy  Lisa Roca 09/25/2014, 12:21 PM

## 2014-09-25 NOTE — Progress Notes (Signed)
Physical Therapy Weekly Progress Note  Patient Details  Name: Mark Gallagher MRN: [id number] Date of Birth: 1970/03/08  Beginning of progress report period: September 19, 2014 End of progress report period: September 24, 2013  Today's Date: 09/25/2014   Session #1: PT Individual Time: 0900-1000 PT Individual Time Calculation (min): 60 min   2/10 pain in neck - premedicated. Session focused on functional gait training with RW (therapist donned B Tedhose for edema control and able to tolerate shoes today) with focus on gait pattern (progressing from step-to to step-through) with demonstrating improved L knee stability noted at steady A progressing to close S level > 200', gait trial without AD in parallel bars (in parallel bars for safety in case pt needed UE support suddenly) with mod A x 20' (back and forth) to challenge balance and allow patient to feel where he is at without AD at this time (pt aware that this was very difficult and unsafe for him), stair negotiation with RW for curb step (min A level) with verbal cues for technique and sequencing and up/down 4 steps with rails at min A level to prepare for accessing second level at home. Pt very appreciative of recommendations and therapy session. Left up in w/c with all needs in reach.   Session #2: Time: 2637-8588 (30 min) No complaints of pain. Introduced HEP for [employer] exercises to address strength and balance. Pt performed 10 reps for BLE including LAQ with 5 second hold, standing hip abduction, standing hamstring curls, mini squats, and standing heel raises and seated toe raises (unabel to perform in standing). Pt with increased difficulty with L side due to weakness and pt made aware of deficits on L side agreeing that he needs to work on this more. Recommended to do this at home 1-2 times a day with someone there to assist at this time.    Patient has met 5 of 5 short term goals.  Pt is making excellent progress with mobility since  admission. LTG's were upgraded due to progress.   Patient continues to demonstrate the following deficits: decreased coordination, decreased strength, decreased sensation, decreased endurance, decreased balance, decreased functional mobility and therefore will continue to benefit from skilled PT intervention to enhance overall performance with activity tolerance, balance, ability to compensate for deficits, functional use of  left upper extremity and left lower extremity, awareness and coordination.  Patient progressing toward long term goals..  Plan of care revisions: Goals upgraded due to progress. See Care Plan for details..  PT Short Term Goals Week 1:  PT Short Term Goal 1 (Week 1): Pt will perform bed mobility with min A PT Short Term Goal 1 - Progress (Week 1): Met PT Short Term Goal 2 (Week 1): Pt will perform bed <> chair transfers mod A of one person PT Short Term Goal 2 - Progress (Week 1): Met PT Short Term Goal 3 (Week 1): Pt will perform w/c mobility x 150' with supervision in controlled environment PT Short Term Goal 3 - Progress (Week 1): Met PT Short Term Goal 4 (Week 1): Pt will perform gait x 100' with LRAD and mod A of one person PT Short Term Goal 4 - Progress (Week 1): Met PT Short Term Goal 5 (Week 1): Pt will negotiate 4 steps with 2 rails and mod A of one person PT Short Term Goal 5 - Progress (Week 1): Met Week 2:  PT Short Term Goal 1 (Week 2): = LTGs  Skilled Therapeutic Interventions/Progress Updates:  Ambulation/gait training;Balance/vestibular training;Cognitive remediation/compensation;Community reintegration;Discharge planning;DME/adaptive equipment instruction;Functional mobility training;Neuromuscular re-education;Pain management;Patient/family education;Splinting/orthotics;Stair training;Psychosocial support;Therapeutic Activities;Therapeutic Exercise;UE/LE Strength taining/ROM;UE/LE Coordination activities;Wheelchair propulsion/positioning;Skin care/wound  management;Disease management/prevention   Therapy Documentation Precautions:  Precautions Precautions: Cervical Precaution Comments: [place] J cervical immobilizer Required Braces or Orthoses: Cervical Brace Cervical Brace: Hard collar, At all times Restrictions Weight Bearing Restrictions: No   Therapy/Group: Individual Therapy  Canary Brim Ivory Broad, PT, DPT  09/25/2014, 3:50 PM

## 2014-09-26 ENCOUNTER — Inpatient Hospital Stay (HOSPITAL_COMMUNITY): Payer: Managed Care, Other (non HMO) | Admitting: Speech Pathology

## 2014-09-26 ENCOUNTER — Inpatient Hospital Stay (HOSPITAL_COMMUNITY): Payer: Managed Care, Other (non HMO) | Admitting: Occupational Therapy

## 2014-09-26 ENCOUNTER — Inpatient Hospital Stay (HOSPITAL_COMMUNITY): Payer: Managed Care, Other (non HMO)

## 2014-09-26 NOTE — Progress Notes (Signed)
Occupational Therapy Session Note  Patient Details  Name: Mark Gallagher MRN: [id number] Date of Birth: December 15, 1970  Today's Date: 09/26/2014 OT Individual Time: 0900-1000 OT Individual Time Calculation (min): 60 min    Short Term Goals: Week 1:  OT Short Term Goal 1 (Week 1): Pt will complete UB dressing with set-up OT Short Term Goal 2 (Week 1): Pt will complete toilet transfer with max A OT Short Term Goal 3 (Week 1): Pt will complete toileting task with steadying assist OT Short Term Goal 4 (Week 1): Pt will complete LB dressing with min A.  Skilled Therapeutic Interventions/Progress Updates:  Patient found supine in bed. Pt engaged in bed mobility and sat EOB. Therapist raised height of bed slightly to increase sit>stand independence. Pt attempted to stand once, but feet started slipping and he was unsuccessful with stand. Pt attempted again with successful sit>stand. Pt ambulated into BR for ADL retraining using tub transfer bench. Verbally educated pt on use of TTB in tub/shower combination for safety and to maximize independence with shower post rehab, at home. Pt using reacher prn to increase overall independence. Pt with no complaints of pain during session and eager to regain strength and independence. Therapist encouraged use of LUE as much as possible as pt is left handed and pt with weaker LUE. Therapist administered bath mit for LUE to increase independence with bathing, pt with complaints of not being able to hold onto wash cloth. Donned bilateral XL TEDs. At end of session, left pt seated in w/c with all needs within reach.   Therapy Documentation Precautions:  Precautions Precautions: Cervical Precaution Comments: [place] J cervical immobilizer Required Braces or Orthoses: Cervical Brace Cervical Brace: Hard collar, At all times Restrictions Weight Bearing Restrictions: No   Function:   Eating Eating   Eating Assist Level: Assistive Device Eating Assistive Device  Comment: adaptive equipment Eating Set Up Assist For: Applying device (includes dentures)       Grooming Oral Care,Brush Teeth, Clean Dentures Activity:      Assist Level: Supervision or verbal cues      Wash, Rinse, Dry Face Activity   Assist Level: Supervision or verbal cues      Wash, Rinse, Dry Hands Activity   Assist Level: Supervision or verbal cues      Brush, Comb Hair Activity   Assist Level: Supervision or verbal cues    Shave Activity    did not occur      Apply Makeup Activity    n/a                                                         Bathing Bathing position   Position: Shower  Bathing parts Body parts bathed by patient: Right arm;Left arm;Chest;Abdomen;Front perineal area;Right upper leg;Left upper leg;Right lower leg;Left lower leg;Buttocks Body parts bathed by helper: Back  Bathing assist Assist Level: Supervision or verbal cues Assistive Device Comment: LH Sponge, administerred bath mit for pt to use next session     Upper Body Dressing/Undressing Upper body dressing   What is the patient wearing?: Pull over shirt/dress     Pull over shirt/dress - Perfomed by patient: Thread/unthread right sleeve;Thread/unthread left sleeve;Put head through opening;Pull shirt over trunk          Upper body assist Assist Level: Set  up   Set up : To obtain clothing/put away;To apply TLSO, cervical collar   Lower Body Dressing/Undressing Lower body dressing   What is the patient wearing?: Pants;Underwear;Shoes;Non-skid slipper socks;[facility] Underwear - Performed by patient: Thread/unthread right underwear leg;Thread/unthread left underwear leg;Pull underwear up/down   Pants- Performed by patient: Thread/unthread right pants leg;Thread/unthread left pants leg;Pull pants up/down   Non-skid slipper socks- Performed by patient: Don/doff right sock Non-skid slipper socks- Performed by helper: Don/doff left sock     Shoes - Performed by patient: Don/doff  right shoe;Don/doff left shoe (left unfastened when therapist left room, pt aware not to get up without assistance from staff)         TED Hose - Performed by helper: Don/doff right TED hose;Don/doff left TED hose  Lower body assist Assist Level: Supervision or verbal cues (close supervision)       Toileting Toileting Toileting activity did not occur: No continent bowel/bladder event        Toileting assist      Bed Mobility Roll left and right activity   Assist level: Supervision or verbal cues    Sit to lying activity    did not occur    Lying to sitting activity    supervision    Mobility details      Transfers Sit to stand transfer   Sit to stand assist level: Touching or steadying assistance (Pt > 75%/lift 1 leg) Sit to stand assistive device: Walker  Chair/bed transfer      did not Counselling psychologist transfer activity did not occur: N/A              Tub/shower transfer   Tub/shower assistive device: Grab bars;Walk in shower;Tub transfer bench   Assist level into tub: Touching or steadying assistance (Pt > 75%/lift 1 leg) Assist level out of tub: Touching or steadying assistance (Pt > 75%)   Cognition Comprehension Comprehension assist level: Follows complex conversation/direction with no assist  Expression Expression assist level: Expresses complex ideas: With no assist  Social Interaction Social Interaction assist level: Interacts appropriately with others - No medications needed.  Problem Solving Problem solving assist level: Solves complex problems: Recognizes & self-corrects  Memory Memory assist level: More than reasonable amount of time    Therapy/Group: Individual Therapy  Josemaria Brining , MS, OTR/L, CLT Pager: [phone]  09/26/2014, 11:13 AM

## 2014-09-26 NOTE — Progress Notes (Signed)
Occupational Therapy Session Note  Patient Details  Name: Mark Gallagher MRN: [id number] Date of Birth: 27-Jan-1970  Today's Date: 09/26/2014 OT Individual Time: 3734-2876 OT Individual Time Calculation (min): 60 min    Short Term Goals: Week 1:  OT Short Term Goal 1 (Week 1): Pt will complete UB dressing with set-up OT Short Term Goal 2 (Week 1): Pt will complete toilet transfer with max A OT Short Term Goal 3 (Week 1): Pt will complete toileting task with steadying assist OT Short Term Goal 4 (Week 1): Pt will complete LB dressing with min A.  Skilled Therapeutic Interventions/Progress Updates:    ADL retraining with focus on functional mobility, sit <> stand, and functional use of dominant LUE.  Pt seated EOB eating breakfast upon arrival.  Encouraged increased use of dominant LUE with self-feeding.  Pt reports built up handle helping with grasp and functional use of LUE.  Pt donned gripper sock on Lt foot, requiring setup assist to don Rt sock.  Pt completed sit <> stand from EOB with min guard, demonstrating increased trunk control and LB strength.  Ambulated around room with RW and min guard to retrieve clothing in preparation for next OT session.  Grooming completed in standing at sink with supervision.   Therapy Documentation Precautions:  Precautions Precautions: Cervical Precaution Comments: [place] J cervical immobilizer Required Braces or Orthoses: Cervical Brace Cervical Brace: Hard collar, At all times Restrictions Weight Bearing Restrictions: No Pain: Pt reports pain 1-2/10 in neck, repositioned.  Function:   Eating Eating   Eating Assist Level: Assistive Device Eating Assistive Device Comment: adaptive equipment Eating Set Up Assist For: Applying device (includes dentures)       Grooming Oral Care,Brush Teeth, Clean Dentures Activity:      Assist Level: Supervision or verbal cues      Wash, Rinse, Dry Face Activity   Assist Level: Supervision or verbal  cues      Wash, Rinse, Dry Hands Activity   Assist Level: Supervision or verbal cues      Brush, Comb Hair Activity   Assist Level: Supervision or verbal cues    Shave Activity          Apply Makeup Activity                                                              Cognition Comprehension Comprehension assist level: Follows complex conversation/direction with no assist  Expression Expression assist level: Expresses complex ideas: With no assist  Social Interaction Social Interaction assist level: Interacts appropriately with others - No medications needed.  Problem Solving Problem solving assist level: Solves complex problems: Recognizes & self-corrects  Memory Memory assist level: More than reasonable amount of time    Therapy/Group: Individual Therapy  Jesse Nosbisch, [place] 09/26/2014, 10:14 AM

## 2014-09-26 NOTE — Progress Notes (Signed)
Speech Language Pathology Daily Session Note  Patient Details  Name: Mark Gallagher MRN: [id number] Date of Birth: 1970/03/16  Today's Date: 09/26/2014 SLP Individual Time: 1120-1200 SLP Individual Time Calculation (min): 40 min  Short Term Goals: Week 1: SLP Short Term Goal 1 (Week 1): Patient will consume current diet with minimal overt s/s of aspiration with supervision verbal cues for use of swallowing compensatory strategies.  SLP Short Term Goal 2 (Week 1): Patient will demonstrate efficient masticaiton of regular textures without overt s/s of aspiration with supervision over 3 sessions prior to upgrade.   Skilled Therapeutic Interventions: Skilled treatment session focused on dysphagia goals. SLP facilitated session by providing skilled observation with lunch meal of regular textures with thin liquids. Patient consumed meal without overt s/s of aspiration and was Mod I for use of swallowing compensatory strategies, therefore, recommend patient upgrade to regular textures. Patient was also Mod I for self-feeding. Patient left upright in wheelchair with RN present. Continue with current plan of care.    Function:  Eating Eating   Modified Consistency Diet: No Eating Assist Level: Assistive Device Eating Assistive Device Comment: adaptive equipment Eating Set Up Assist For: Applying device (includes dentures)       Cognition Comprehension Comprehension assist level: Follows complex conversation/direction with no assist  Expression   Expression assist level: Expresses complex ideas: With no assist  Social Interaction Social Interaction assist level: Interacts appropriately with others - No medications needed.  Problem Solving Problem solving assist level: Solves complex problems: Recognizes & self-corrects  Memory Memory assist level: More than reasonable amount of time    Pain Reports pain in left arm, patient administered medications   Therapy/Group: Individual  Therapy  Clista Rainford 09/26/2014, 2:08 PM

## 2014-09-26 NOTE — Progress Notes (Signed)
Recreational Therapy Session Note  Patient Details  Name: Mark Gallagher MRN: [id number] Date of Birth: 1970/09/27 Today's Date: 09/26/2014  Pain: no c/o Skilled Therapeutic Interventions/Progress Updates: Session focused on activity tolerance, dynamic standing balance and UE use.  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  Pt performed sit-stand with contact guard assist.    Therapy/Group: Co-Treatment   Evelean Bigler 09/26/2014, 4:00 PM

## 2014-09-26 NOTE — Progress Notes (Signed)
Occupational Therapy Session Note  Patient Details  Name: COLEBY YETT MRN: [id number] Date of Birth: [birth year]  Today's Date: 09/26/2014 OT Individual Time: 1030-1100 OT Individual Time Calculation (min): 30 min    Short Term Goals: Week 1:  OT Short Term Goal 1 (Week 1): Pt will complete UB dressing with set-up OT Short Term Goal 2 (Week 1): Pt will complete toilet transfer with max A OT Short Term Goal 3 (Week 1): Pt will complete toileting task with steadying assist OT Short Term Goal 4 (Week 1): Pt will complete LB dressing with min A.  Skilled Therapeutic Interventions/Progress Updates: Therapeutic activity with focus on improved left hand functioning, grip/pinch strengthening.   Reinforcement provided on functional hand-writing task from earlier treatments.  Pt advised to practice printing letters of the alphabet, individually, and later combining letters to form words.  Pt instructed on 3 of 11 grip/pinch strengthening exercises for left hand.    Pt completes 3 of 11 with mod instructional cues and hand guidance to perform exercise correctly.   Recommended hand grip/pinch strength assessment and continued functional tasks to enhance [facility].     Therapy Documentation Precautions:  Precautions Precautions: Cervical Precaution Comments: [place] J cervical immobilizer Required Braces or Orthoses: Cervical Brace Cervical Brace: Hard collar, At all times Restrictions Weight Bearing Restrictions: No  Pain: No/denies pain   Therapy/Group: Individual Therapy  [place] 09/26/2014, 12:50 PM

## 2014-09-26 NOTE — Progress Notes (Signed)
Physical Therapy Session Note  Patient Details  Name: Mark Gallagher MRN: [id number] Date of Birth: Jun 07, 1970  Today's Date: 09/26/2014 PT Individual Time: 1430-1530 PT Individual Time Calculation (min): 60 min   Short Term Goals: Week 2:  PT Short Term Goal 1 (Week 2): = LTGs  Skilled Therapeutic Interventions/Progress Updates:    Session focused on functional gait training with RW with focus on improved gait pattern (trying to progress from step to pattern to more of a step through pattern) > 150' at close S/steady A level for balance and cues for step length, gait pattern, and placement of RW, stair negotiation for home mobility to second floor x 12 steps with min A with B rails with cues for technique, and dynamic standing balance standing in front of computer to simulate work setting x 6 min at S/steady A level working on fine motor task to type on computer. Discussed home and potential work modifications to accommodate patient's deficits and impairments.   Therapy Documentation Precautions:  Precautions Precautions: Cervical Precaution Comments: [place] J cervical immobilizer Required Braces or Orthoses: Cervical Brace Cervical Brace: Hard collar, At all times Restrictions Weight Bearing Restrictions: No  Pain: Denies pain.   Function:   Transfers Sit to stand transfer   Sit to stand assist level: Touching or steadying assistance (Pt > 75%/lift 1 leg) Sit to stand assistive device: Walker;Orthosis  Chair/bed transfer   Chair/bed transfer method: Ambulatory Chair/bed transfer assist level: Touching or steadying assistance (Pt > 75%) Chair/bed transfer assistive device: Scientist, clinical (histocompatibility and immunogenetics) device: Walker-rolling Max distance: 200 Assist level: Touching or steadying assistance (Pt > 75%)  Walk 10 feet activity   Assist level: Supervision or verbal cues  Walk 50 feet  with 2 turns activity   Assist level: Touching or steadying assistance (Pt > 75%)  Walk 150 feet activity   Assist level: Touching or steadying assistance (Pt > 75%)  Walk 10 feet on uneven surfaces activity      Stairs   Stairs assistive device: 2 hand rails;Orthosis Max number of stairs: 12 Stairs assist level: Touching or steadying assistance (Pt > 75%)  Walk up/down 1 step activity     Walk up/down 1 step (curb) assist level: Touching or steadying assistance (Pt > 75%)  Walk up/down 4 steps activity   Walk up/down 4 steps assist level: Touching or steadying assistance (Pt > 75%)  Walk up/down 12 steps activity   Walk up/down 12 steps assist level: Touching or steadying assistance (Pt > 75%)  Pick up small objects from floor      Wheelchair          Wheel 50 feet with 2 turns activity      Wheel 150 feet activity          Therapy/Group: Individual Therapy  Canary Brim Ivory Broad, PT, DPT  09/26/2014, 3:38 PM

## 2014-09-26 NOTE — Progress Notes (Signed)
[facility] PHYSICAL MEDICINE & REHABILITATION     PROGRESS NOTE    Subjective/Complaints: Left pec a little sore. Otherwise doing well. Asked why he had three OT sessions today  ROS: Pt denies fever, rash/itching, headache, blurred or double vision, nausea, vomiting, abdominal pain, diarrhea, chest pain, shortness of breath, palpitations, dysuria, dizziness, neck or back pain, bleeding, anxiety, or depression   Objective: Vital Signs: Blood pressure 123/68, pulse 67, temperature 98.2 F (36.8 C), temperature source Tympanic, resp. rate 16, height 6\' 8"  (2.032 m), weight 121.292 kg (267 lb 6.4 oz), SpO2 98 %. No results found.  Recent Labs  09/25/14 0519  WBC 4.1  HGB 9.4*  HCT 28.8*  PLT 304   No results for input(s): NA, K, CL, GLUCOSE, BUN, CREATININE, CALCIUM in the last 72 hours.  Invalid input(s): CO CBG (last 3)  No results for input(s): GLUCAP in the last 72 hours.  Wt Readings from Last 3 Encounters:  09/20/14 121.292 kg (267 lb 6.4 oz)  08/28/14 109.997 kg (242 lb 8 oz)  08/23/14 113.399 kg (250 lb)    Physical Exam:  Constitutional: He is oriented to person, place, and time. He appears well-developed and well-nourished. Large frame/head HENT: oral mucosa pink and moist Head: Atraumatic.  Eyes: Conjunctivae and EOM are normal. Pupils are equal, round, and reactive to light. Right eye exhibits no discharge.  Neck:  Cervical collar in place  Cardiovascular: Normal rate and regular rhythm.  No murmur heard. Respiratory: Effort normal and breath sounds normal. No respiratory distress. He has no wheezes. He exhibits no tenderness.  GI: Soft. Bowel sounds are normal. He exhibits no distension. There is no tenderness.  Musculoskeletal: He exhibits improved edema ( trace 1+ pedal edema bilaterally). Trace to no edema left hand Ecchymosis left 4th toe and left lateral malleolus. Trace to 1+ edema left foot and left hand improved Pain over left pec with  palpation is improved Neurological: He is alert and oriented to person, place, and time. RUE: 4+/5 deltoid, bicep, tricep, HI. LUE: 4/5 delt, bicep, tricep, 4-wrist and HI. Positive left pronator drift. Sensation 1+/2 LUE and LLE. RLE: 4- HF, 4/5 KE and ADF/APF. LLE: 3+HF and KE and 1/5 ADF/APF. Sensation 1++/2 left foot to LT and pain.  Able to follow commands without difficulty. Soft voice still with oral secretions.  Skin: Skin is warm and dry.  Psychiatric: He has a normal mood and affect. His behavior is normal. Judgment and thought content normal.    Assessment/Plan: 1. Functional deficits secondary to C6 SCI  which require 3+ hours per day of interdisciplinary therapy in a comprehensive inpatient rehab setting. Physiatrist is providing close team supervision and 24 hour management of active medical problems listed below. Physiatrist and rehab team continue to assess barriers to discharge/monitor patient progress toward functional and medical goals.  Function:  Bathing Bathing position   Position: Shower  Bathing parts Body parts bathed by patient: Right arm, Left arm, Chest, Abdomen, Front perineal area, Right upper leg, Left upper leg, Right lower leg, Left lower leg Body parts bathed by helper: Buttocks, Back  Bathing assist Assist Level: Touching or steadying assistance(Pt > 75%) Assistive Device Comment: LH Sponge    Upper Body Dressing/Undressing Upper body dressing   What is the patient wearing?: Pull over shirt/dress     Pull over shirt/dress - Perfomed by patient: Thread/unthread right sleeve, Thread/unthread left sleeve, Put head through opening, Pull shirt over trunk Pull over shirt/dress - Perfomed by helper: Put  head through opening, Pull shirt over trunk        Upper body assist Assist Level: Set up   Set up : To obtain clothing/put away, To apply TLSO, cervical collar  Lower Body Dressing/Undressing Lower body dressing   What is the patient wearing?: Pants,  Socks Underwear - Performed by patient: Thread/unthread right underwear leg, Thread/unthread left underwear leg, Pull underwear up/down   Pants- Performed by patient: Thread/unthread right pants leg, Thread/unthread left pants leg, Pull pants up/down         Socks - Performed by helper: Don/doff right sock, Don/doff left sock           TED Hose - Performed by helper: Don/doff right TED hose, Don/doff left TED hose  Lower body assist Assist Level: Touching or steadying assistance (Pt > 75%)      Toileting Toileting   Toileting steps completed by patient: Adjust clothing prior to toileting, Performs perineal hygiene, Adjust clothing after toileting Toileting steps completed by helper: Adjust clothing after toileting Toileting Assistive Devices: Grab bar or rail  Toileting assist Assist level: More than reasonable time   Transfers Chair/bed transfer   Chair/bed transfer method: Ambulatory Chair/bed transfer assist level: Touching or steadying assistance (Pt > 75%) Chair/bed transfer assistive device: Walker, Air cabin crew     Max distance: 200 Assist level: Touching or steadying assistance (Pt > 75%)   Wheelchair   Type: Manual Max wheelchair distance: 200 ft Assist Level: Supervision or verbal cues  Cognition Comprehension Comprehension assist level: Follows complex conversation/direction with no assist  Expression Expression assist level: Expresses complex ideas: With no assist  Social Interaction Social Interaction assist level: Interacts appropriately with others - No medications needed.  Problem Solving Problem solving assist level: Solves complex problems: Recognizes & self-corrects  Memory Memory assist level: More than reasonable amount of time    Medical Problem List and Plan: 1. Functional deficits secondary to C6 SCI with incomplete myelopathy  -have contacted orthotist about getting replacement pads  -team conference today 2. DVT  Prophylaxis/Anticoagulation: Pharmaceutical: Lovenox indicated 3. Pain Management: Will continue oxycodone prn.   -  kpad prn  -  Sports cream to left pec prn 4. Mood: LCSW to follow for evaluation and support.  5. Neuropsych: This patient is capable of making decisions on his own behalf. 6. Skin/Wound Care: Monitor wound daily. Continue wound care dialy.  7. Fluids/Electrolytes/Nutrition: Monitor I/O. Encourage po. Sodium sl low but otherwise bmet normal 8. ABLA: hgb stable at 9.4. continue fe++ supplement  9. Adrenal insufficiency: On cortef bid for supplement. 10 Hypothyroidism: On synthroid.  11. Hypogonadism: Depotestosterone every 8 weeks--follow up with patient regarding next dose  12. 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 To follow up with cardiology after discharge--currently in normal rhythm with rate control 13. H/o Acromegaly: Treated with Sandostatin? 14. Dysphagia: likely related to surgery/collar---D3 diet still   -speech eval/treatment appreciated  15. Edema: TEDS  - ACE wraps and elevation helping      LOS (Days) 8 A FACE TO FACE EVALUATION WAS PERFORMED  SWARTZ,ZACHARY T 09/26/2014 7:50 AM

## 2014-09-27 ENCOUNTER — Inpatient Hospital Stay (HOSPITAL_COMMUNITY): Payer: Managed Care, Other (non HMO) | Admitting: Occupational Therapy

## 2014-09-27 ENCOUNTER — Inpatient Hospital Stay (HOSPITAL_COMMUNITY): Payer: Managed Care, Other (non HMO)

## 2014-09-27 ENCOUNTER — Inpatient Hospital Stay (HOSPITAL_COMMUNITY): Payer: Managed Care, Other (non HMO) | Admitting: *Deleted

## 2014-09-27 ENCOUNTER — Inpatient Hospital Stay (HOSPITAL_COMMUNITY): Payer: Managed Care, Other (non HMO) | Admitting: Physical Therapy

## 2014-09-27 NOTE — Progress Notes (Signed)
Occupational Therapy Session Note  Patient Details  Name: Mark Gallagher MRN: [id number] Date of Birth: 26-Dec-1970  Today's Date: 09/27/2014 OT Individual Time: 1000-1058 OT Individual Time Calculation (min): 58 min    Short Term Goals: Week 1:  OT Short Term Goal 1 (Week 1): Pt will complete UB dressing with set-up OT Short Term Goal 2 (Week 1): Pt will complete toilet transfer with max A OT Short Term Goal 3 (Week 1): Pt will complete toileting task with steadying assist OT Short Term Goal 4 (Week 1): Pt will complete LB dressing with min A.  Skilled Therapeutic Interventions/Progress Updates:  Pt seated in wheelchair  with 3/10 c/o pain this session in B shoulders upon entering the room.However, pt is agreeable to OT intervention this session. OT propelled pt wheelchair to day room this session for energy conservation. OT educated and demonstrated fine motor coordination tasks for L hand for finger isolation, palmar translation tasks, and use of card game for coordination and to encourage past leisure activity. Pt having increased difficulty with finger isolation especially in regards to 4th and 5th digits this session. With card game, pt having multiple drops of cards from L hand, pt unable to shuffle cards, and difficulty sliding cards from palm onto table with use of L thumb. OT also providing pencil grip to encourage tripod grasp for writing. Pt will practice handwriting on his own further when not in therapy session. Pt returned to room with call bell and all needed items within reach upon exiting the room.   Therapy Documentation Precautions:  Precautions Precautions: Cervical Precaution Comments: [place] J cervical immobilizer Required Braces or Orthoses: Cervical Brace Cervical Brace: Hard collar, At all times Restrictions Weight Bearing Restrictions: No  See Function Navigator for Current Functional Status.   Therapy/Group: Individual Therapy  Phineas Semen 09/27/2014,  12:51 PM

## 2014-09-27 NOTE — Patient Care Conference (Signed)
Inpatient RehabilitationTeam Conference and Plan of Care Update Date: 09/26/2014   Time: 2:00 PM    Patient Name: Mark Gallagher      Medical Record Number: [id number]  Date of Birth: 21-Jun-1970 Sex: Male         Room/Bed: 4M08C/4M08C-01 Payor Info: Payor: [employer] / Plan: [employer] MANAGED / Product Type: *No Product type* /    Admitting Diagnosis: acromegaly with cervical fusion and cardiac ablation  Admit Date/Time:  09/18/2014  2:07 PM Admission Comments: No comment available   Primary Diagnosis:  Cervical spondylosis with myelopathy Principal Problem: Cervical spondylosis with myelopathy  Patient Active Problem List   Diagnosis Date Noted  . Tetraplegia 09/19/2014  . Acute blood loss anemia 09/19/2014  . Cervical spondylosis with myelopathy 09/18/2014  . Radiculopathy of arm 08/28/2014  . Hypogonadism male 09/08/2013  . 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 Cardiomyopathy 04/01/2011  . Wolff-Parkinson-White (WPW) syndrome 04/01/2011    Expected Discharge Date: Expected Discharge Date: 10/07/14  Team Members Present: Physician leading conference: Dr. Alger Gallagher Social Worker Present: Mark Alpers, LCSW Nurse Present: Other (comment) Mark Frederickson, RN) PT Present: Mark Gallagher, PT OT Present: Mark Gallagher, OT SLP Present: Mark Gallagher, SLP Other (Discipline and Name): Mark Baxter, RN [facility]) PPS Coordinator present : Mark Nakayama, RN, [facility]     Current Status/Progress Goal Weekly Team Focus  Medical   pain issues left shoulder/neck. gradual neurological progress. swallowing progresssing  improve balance  edema control, pain, anemia   Bowel/Bladder   Continent of bowel and bladder; LBM 9/4  Mod I  Assess and treat for constipation as needed   Swallow/Nutrition/ Hydration   Regular textures with thin liquids, Mod I  Mod I  Tolerance of current diet upgrade    ADL's   Min assist sit <> stand and basic transfers, setup grooming and UB dressing, min-mod assist LB dressing   Overall supervision- min A  sit <> stand, functional transfers, LB dressing, dominant LUE NMR and FMC   Mobility   min to light mod A for transfers, min A gait and stairs  S gait and transfers; min A stairs; d/c w/c mobility goal  strength, balance, gait, stair training, d/c planning, neuro re-ed, endurance, bed mobility   Communication             Safety/Cognition/ Behavioral Observations            Pain   Soreness to left shoulder and neck; takes tylenol 650mg  q4h and ultram 50mg  po prn  < 3  Assess and treat for pain q shift and prn   Skin   Removed sutures to neck; approximated; performing cleanse as ordered  Remain free from skin breakdown or infection with min assist  Assess skin q shift and prn; educate on incisional care to neck    Rehab Goals Patient on target to meet rehab goals: Yes Rehab Goals Revised: Many of pt's goals have been upgraded to provide pt with more functional independence and to alleviate elderly parents of hands on physical care. *See Care Plan and progress notes for long and short-term goals.  Barriers to Discharge: size,persistent neuro deficits    Possible Resolutions to Barriers:  supervision at home.     Discharge Planning/Teaching Needs:  Pt and family have decided that it would be better for pt/family to provide pt with 24/7 supervision in his own home.  His sister and parents will be his caregivers. Pt also has a flight of stairs he will need to be able  to navigate at least twice a day and therapists will be focusing on that throughout his stay, in addition to other goals. Parents will come for family education closer to pt's d/c.   Team Discussion:  Pt is improving neurologically and his edema is decreasing.  He has mild pain issues in his neck and shoulder, but is working through these.  His anemia is becoming more stable, although MD will continue to monitor this closely.  Pt is continent, but is a high fall risk, especially with hx of falls.   Pt's on a regular diet.  Pt's mobility is improving and PT has talked with pt about building up lower surfaces at home - bed, chair, sofa, etc.  PT is working on flight of stairs.  OT is working with pt to get him to have more functionally use of his left hand.   Revisions to Treatment Plan:  none   Continued Need for Acute Rehabilitation Level of Care: The patient requires daily medical management by a physician with specialized training in physical medicine and rehabilitation for the following conditions: Daily direction of a multidisciplinary physical rehabilitation program to ensure safe treatment while eliciting the highest outcome that is of practical value to the patient.: Yes Daily medical management of patient stability for increased activity during participation in an intensive rehabilitation regime.: Yes Daily analysis of laboratory values and/or radiology reports with any subsequent need for medication adjustment of medical intervention for : Neurological problems;Post surgical problems  Mark Gallagher, Mark Gallagher 09/27/2014, 9:50 AM

## 2014-09-27 NOTE — Progress Notes (Signed)
Recreational Therapy Session Note  Patient Details  Name: Mark Gallagher MRN: [id number] Date of Birth: 06/08/1970 Today's Date: 09/27/2014  Pain: no c/o Skilled Therapeutic Interventions/Progress Updates: Session focused on activity tolerance & functional mobility throughout the hospital and outside on community surfaces.  Pt ambulated using RW with supervision throughout the hospital >1000' including accessing elevators.  Pt required close supervision and min instructional cues for ambulating on outdoor uneven surfaces/ramp and going up & down a curb using RW.  Pt stood at [employer] to sign in/out of unit log with supervision.  Also practiced sit-stands from various heights of benches with 1UE and education provided on identifying potential seating locations if choices available.  Pt stated understanding.  Therapy/Group: Co-Treatment   Nobuo Nunziata 09/27/2014, 9:41 AM

## 2014-09-27 NOTE — Progress Notes (Signed)
Occupational Therapy Session Note  Patient Details  Name: LINK BURGESON MRN: [id number] Date of Birth: 08-01-1970  Today's Date: 09/27/2014 OT Individual Time: 0700-0800 OT Individual Time Calculation (min): 60 min    Short Term Goals: Week 1:  OT Short Term Goal 1 (Week 1): Pt will complete UB dressing with set-up OT Short Term Goal 2 (Week 1): Pt will complete toilet transfer with max A OT Short Term Goal 3 (Week 1): Pt will complete toileting task with steadying assist OT Short Term Goal 4 (Week 1): Pt will complete LB dressing with min A.  Skilled Therapeutic Interventions/Progress Updates:    Pt engaged in BADL retraining including bathing at shower level and dressing with sit<>stand from w/c.  Pt amb with RW to bathroom and completed bathing tasks with supervision (See Functional Navigator). Pt required min A for sit<>stand from tub bench without use of grab bars.  Pt states he does not have grab bars at home but will have them installed.  Pt required more than a reasonable amount of time to complete tasks.  Focus on sit<>stand, standing balance, functional amb with RW, safety awareness, and discharge planning to increase independence with BADLs.  Therapy Documentation Precautions:  Precautions Precautions: Cervical Precaution Comments: [place] J cervical immobilizer Required Braces or Orthoses: Cervical Brace Cervical Brace: Hard collar, At all times Restrictions Weight Bearing Restrictions: No Pain: Pain Assessment Pain Assessment: No/denies pain Pain Score: 2  Pain Type: Acute pain;Surgical pain Pain Location: Neck Pain Orientation: Left Pain Descriptors / Indicators: Sore Pain Onset: On-going Pain Intervention(s):Meds admin prior to therapy session Multiple Pain Sites: No    See Function Navigator for Current Functional Status.   Therapy/Group: Individual Therapy  Leroy Libman 09/27/2014, 9:01 AM

## 2014-09-27 NOTE — Progress Notes (Signed)
Physical Therapy Session Note  Patient Details  Name: Mark Gallagher MRN: [id number] Date of Birth: [birth year]  Today's Date: 09/27/2014 PT Individual Time: 0830-0930 Treatment Session 2: 1405-1510 PT Individual Time Calculation (min): 60 min Treatment Session 2: 65 min  Short Term Goals: Week 2:  PT Short Term Goal 1 (Week 2): = LTGs  Skilled Therapeutic Interventions/Progress Updates:    Treatment Session 1:  Pt received up in high back w/c with hard collar in place, agreeable to PT session. Community Integration - See below for ambulation details. Pt walks a distance of > 1,000' with RW and close SBA for safety on uneven terrain, negotiating elevator thresholds, up/down a steep ramp - with verbal cues for technique. PT instructs pt in managing a curb with RW req min A for safety. Pt completes furniture transfer from low park bench with one armrail req verbal cues for technique. Pt ended up in w/c with all needs in reach. Community ambulation is coming along - one LOB that pt self corrects.   Treatment Session 2: Pt received sitting eob - SW present. Therapeutic Activity - see Function tab for bed mobility details. Gait Training - From room to/from gym with RW req SBA - focus on neutral foot position (toes/hips tend to IR). See Function for stair training - ascending with strong leg, descending with weak leg - in step to pattern. Neuromuscular Reeducation - PT instructs pt in [place] level A exercises req verbal and visual cues for technique x 10 reps each. Pt demonstrates very weak L knee flexors, extremely weak calves bilaterally with attempted heel raises (knee flex and pt is at danger of buckling both knees) and instability in L knee on stance leg. PT takes standing heel/toe raises out of HEP and gives pt seated AROM and theraband resisted ankle PF/DF and pt req max cues for use of theraband with this exercise. Pt ended up in w/c with all needs in reach. Continue per PT POC.   Therapy  Documentation Precautions:  Precautions Precautions: Cervical Precaution Comments: [place] J cervical immobilizer Required Braces or Orthoses: Cervical Brace Cervical Brace: Hard collar, At all times Restrictions Weight Bearing Restrictions: No Pain: Pain Assessment Pain Assessment: 0-10 Pain Score: 6  Pain Type: Acute pain Pain Location: Shoulder Pain Orientation: Right;Left Pain Descriptors / Indicators: Aching Pain Frequency: Intermittent Pain Onset: With Activity Patients Stated Pain Goal: 3 Pain Intervention(s): Medication (See eMAR) Multiple Pain Sites: No Treatment Session 2: Pt c/o 3/10 neck pain soreness from surgery and PT uses rest to decrease pt's pain.   See Function Navigator for Current Functional Status.   Therapy/Group: Individual Therapy, Co-Treatment and with Rec Therapy during AM session  Swanson Farnell M 09/27/2014, 12:12 PM

## 2014-09-27 NOTE — Progress Notes (Signed)
[place] PHYSICAL MEDICINE & REHABILITATION     PROGRESS NOTE    Subjective/Complaints: UP on toilet with OT. No new complaints. Feels that swelling is better  ROS: Pt denies fever, rash/itching, headache, blurred or double vision, nausea, vomiting, abdominal pain, diarrhea, chest pain, shortness of breath, palpitations, dysuria, dizziness, neck or back pain, bleeding, anxiety, or depression   Objective: Vital Signs: Blood pressure 108/67, pulse 61, temperature 97.7 F (36.5 C), temperature source Oral, resp. rate 16, height 6\' 8"  (2.032 m), weight 121.292 kg (267 lb 6.4 oz), SpO2 97 %. No results found.  Recent Labs  09/25/14 0519  WBC 4.1  HGB 9.4*  HCT 28.8*  PLT 304   No results for input(s): NA, K, CL, GLUCOSE, BUN, CREATININE, CALCIUM in the last 72 hours.  Invalid input(s): CO CBG (last 3)  No results for input(s): GLUCAP in the last 72 hours.  Wt Readings from Last 3 Encounters:  09/20/14 121.292 kg (267 lb 6.4 oz)  08/28/14 109.997 kg (242 lb 8 oz)  08/23/14 113.399 kg (250 lb)    Physical Exam:  Constitutional: He is oriented to person, place, and time. He appears well-developed and well-nourished. Large frame/head HENT: oral mucosa pink and moist Head: Atraumatic.  Eyes: Conjunctivae and EOM are normal. Pupils are equal, round, and reactive to light. Right eye exhibits no discharge.  Neck:  Cervical collar in place  Cardiovascular: Normal rate and regular rhythm.  No murmur heard. Respiratory: Effort normal and breath sounds normal. No respiratory distress. He has no wheezes. He exhibits no tenderness.  GI: Soft. Bowel sounds are normal. He exhibits no distension. There is no tenderness.  Musculoskeletal: He exhibits improved edema ( trace to 1+ pedal edema bilaterally). Trace to no edema left hand Ecchymosis left 4th toe and left lateral malleolus. Trace to 1+ edema left foot and left hand improved Pain over left pec with palpation is  improved Neurological: He is alert and oriented to person, place, and time. RUE: 4+/5 deltoid, bicep, tricep, HI. LUE: 4/5 delt, bicep, tricep, 4-wrist and HI. Positive left pronator drift. Sensation 1+/2 LUE and LLE. RLE: 4- HF, 4/5 KE and ADF/APF. LLE: 3+HF and KE and 1/5 ADF/APF. Sensation 1++/2 left foot to LT and pain.  Skin: Skin is warm and dry.  Psychiatric: He has a normal mood and affect. His behavior is normal. Judgment and thought content normal.    Assessment/Plan: 1. Functional deficits secondary to C6 SCI  which require 3+ hours per day of interdisciplinary therapy in a comprehensive inpatient rehab setting. Physiatrist is providing close team supervision and 24 hour management of active medical problems listed below. Physiatrist and rehab team continue to assess barriers to discharge/monitor patient progress toward functional and medical goals.  Function:  Bathing Bathing position   Position: Shower  Bathing parts Body parts bathed by patient: Right arm, Left arm, Chest, Abdomen, Front perineal area, Right upper leg, Left upper leg, Right lower leg, Left lower leg, Buttocks Body parts bathed by helper: Back  Bathing assist Assist Level: Supervision or verbal cues Assistive Device Comment: LH Sponge, administerred bath mit for pt to use next session    Upper Body Dressing/Undressing Upper body dressing   What is the patient wearing?: Pull over shirt/dress     Pull over shirt/dress - Perfomed by patient: Thread/unthread right sleeve, Thread/unthread left sleeve, Put head through opening, Pull shirt over trunk Pull over shirt/dress - Perfomed by helper: Put head through opening, Pull shirt over trunk  Upper body assist Assist Level: Set up   Set up : To obtain clothing/put away, To apply TLSO, cervical collar  Lower Body Dressing/Undressing Lower body dressing   What is the patient wearing?: Pants, Underwear, Shoes, Non-skid slipper socks, Ted Hose Underwear -  Performed by patient: Thread/unthread right underwear leg, Thread/unthread left underwear leg, Pull underwear up/down   Pants- Performed by patient: Thread/unthread right pants leg, Thread/unthread left pants leg, Pull pants up/down   Non-skid slipper socks- Performed by patient: Don/doff right sock Non-skid slipper socks- Performed by helper: Don/doff left sock   Socks - Performed by helper: Don/doff right sock, Don/doff left sock Shoes - Performed by patient: Don/doff right shoe, Don/doff left shoe (left unfastened when therapist left room, pt aware not to get up without assistance from staff)         TED Hose - Performed by helper: Don/doff right TED hose, Don/doff left TED hose  Lower body assist Assist Level: Supervision or verbal cues (close supervision)      Toileting Toileting Toileting activity did not occur: No continent bowel/bladder event Toileting steps completed by patient: Adjust clothing prior to toileting, Performs perineal hygiene, Adjust clothing after toileting Toileting steps completed by helper: Adjust clothing prior to toileting Toileting Assistive Devices: Grab bar or rail  Toileting assist Assist level: More than reasonable time   Transfers Chair/bed transfer   Chair/bed transfer method: Ambulatory Chair/bed transfer assist level: Touching or steadying assistance (Pt > 75%) Chair/bed transfer assistive device: Walker, Air cabin crew     Max distance: 200 Assist level: Touching or steadying assistance (Pt > 75%)   Wheelchair   Type: Manual Max wheelchair distance: 200 ft Assist Level: Supervision or verbal cues  Cognition Comprehension Comprehension assist level: Follows basic conversation/direction with no assist  Expression Expression assist level: Expresses complex ideas: With no assist  Social Interaction Social Interaction assist level: Interacts appropriately with others - No medications needed.  Problem Solving Problem  solving assist level: Solves complex problems: Recognizes & self-corrects  Memory Memory assist level: More than reasonable amount of time    Medical Problem List and Plan: 1. Functional deficits secondary to C6 SCI with incomplete myelopathy  -have contacted orthotist about getting replacement pads---brought aspen pads mistakenly    2. DVT Prophylaxis/Anticoagulation: Pharmaceutical: Lovenox indicated 3. Pain Management: Will continue oxycodone prn.   -  kpad prn  -  Sports cream to left pec prn 4. Mood: LCSW to follow for evaluation and support.  5. Neuropsych: This patient is capable of making decisions on his own behalf. 6. Skin/Wound Care: Monitor wound daily. Continue wound care dialy.  7. Fluids/Electrolytes/Nutrition: Monitor I/O. Encourage po. Sodium sl low but otherwise bmet normal 8. ABLA: hgb stable at 9.4. continue fe++ supplement  9. Adrenal insufficiency: On cortef bid for supplement. 10 Hypothyroidism: On synthroid.  11. Hypogonadism: Depotestosterone every 8 weeks--follow up with patient regarding next dose  12. 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 To follow up with cardiology after discharge--currently in normal rhythm with rate control 13. H/o Acromegaly: Treated with Sandostatin? 14. Dysphagia: likely related to surgery/collar---improved---advanced to regular diet   -speech eval/treatment appreciated  15. Edema: TEDS  - ACE wraps and elevation helping---continue as long as needed      LOS (Days) 9 A FACE TO FACE EVALUATION WAS PERFORMED  Mark Gallagher T 09/27/2014 8:12 AM

## 2014-09-28 ENCOUNTER — Inpatient Hospital Stay (HOSPITAL_COMMUNITY): Payer: Managed Care, Other (non HMO)

## 2014-09-28 ENCOUNTER — Encounter (HOSPITAL_COMMUNITY): Payer: Managed Care, Other (non HMO)

## 2014-09-28 ENCOUNTER — Inpatient Hospital Stay (HOSPITAL_COMMUNITY): Payer: Managed Care, Other (non HMO) | Admitting: Speech Pathology

## 2014-09-28 ENCOUNTER — Inpatient Hospital Stay (HOSPITAL_COMMUNITY): Payer: Managed Care, Other (non HMO) | Admitting: Physical Therapy

## 2014-09-28 DIAGNOSIS — F4321 Adjustment disorder with depressed mood: Secondary | ICD-10-CM

## 2014-09-28 MED ORDER — TESTOSTERONE CYPIONATE 200 MG/ML IM SOLN: 300.0000 mg | Freq: Once | INTRAMUSCULAR | Status: DC

## 2014-09-28 NOTE — Progress Notes (Signed)
Occupational Therapy Session Note  Patient Details  Name: Mark Gallagher MRN: [id number] Date of Birth: 06-06-1970  Today's Date: 09/28/2014 OT Individual Time: 0700-0800 OT Individual Time Calculation (min): 60 min    Short Term Goals: Week 1:  OT Short Term Goal 1 (Week 1): Pt will complete UB dressing with set-up OT Short Term Goal 2 (Week 1): Pt will complete toilet transfer with max A OT Short Term Goal 3 (Week 1): Pt will complete toileting task with steadying assist OT Short Term Goal 4 (Week 1): Pt will complete LB dressing with min A.  Skilled Therapeutic Interventions/Progress Updates:    Pt engaged in BADL retraining including bathing at shower level and dressing with sit<>stand from w/c.  See Function Navigator for functional status.  Pt amb with RW to bathroom to enter shower.  Pt completed bathing tasks with sit<>stand to bathe buttocks and used AE to assist with LB bathing tasks.  Pt was able to perform sit<>stand from tub bench this morning without assistance while using grab bar.  Pt states he is going to have grab bars installed at home.  Pt is very methodical and requires more than a reasonable amount of time to complete tasks.  Focus on functional amb with RW, sit<>stand, standing balance, continued discharge planning, and safety awareness to increase independence with BADLs.   Therapy Documentation Precautions:  Precautions Precautions: Cervical Precaution Comments: [place] J cervical immobilizer Required Braces or Orthoses: Cervical Brace Cervical Brace: Hard collar, At all times Restrictions Weight Bearing Restrictions: No  Pain: Pain Assessment Pain Assessment: No/denies pain   See Function Navigator for Current Functional Status.   Therapy/Group: Individual Therapy  Leroy Libman 09/28/2014, 8:01 AM

## 2014-09-28 NOTE — Progress Notes (Signed)
[place] PHYSICAL MEDICINE & REHABILITATION     PROGRESS NOTE    Subjective/Complaints: Walked quite a bit yesterday. Received pads for collar  ROS: Pt denies fever, rash/itching, headache, blurred or double vision, nausea, vomiting, abdominal pain, diarrhea, chest pain, shortness of breath, palpitations, dysuria, dizziness, neck or back pain, bleeding, anxiety, or depression   Objective: Vital Signs: Blood pressure 126/91, pulse 64, temperature 97.9 F (36.6 C), temperature source Oral, resp. rate 18, height 6\' 8"  (2.032 m), weight 121.292 kg (267 lb 6.4 oz), SpO2 96 %. No results found. No results for input(s): WBC, HGB, HCT, PLT in the last 72 hours. No results for input(s): NA, K, CL, GLUCOSE, BUN, CREATININE, CALCIUM in the last 72 hours.  Invalid input(s): CO CBG (last 3)  No results for input(s): GLUCAP in the last 72 hours.  Wt Readings from Last 3 Encounters:  09/20/14 121.292 kg (267 lb 6.4 oz)  08/28/14 109.997 kg (242 lb 8 oz)  08/23/14 113.399 kg (250 lb)    Physical Exam:  Constitutional: He is oriented to person, place, and time. He appears well-developed and well-nourished. Large frame/head HENT: oral mucosa pink and moist Head: Atraumatic.  Eyes: Conjunctivae and EOM are normal. Pupils are equal, round, and reactive to light. Right eye exhibits no discharge.  Neck:  Cervical collar fitting appropriately Cardiovascular: Normal rate and regular rhythm.  No murmur heard. Respiratory: Effort normal and breath sounds normal. No respiratory distress. He has no wheezes. He exhibits no tenderness.  GI: Soft. Bowel sounds are normal. He exhibits no distension. There is no tenderness.  Musculoskeletal: He exhibits improving edema ( trace   pedal edema bilaterally---none in legs).   no edema left hand   Pain over left pec with palpation is improved Neurological: He is alert and oriented to person, place, and time. RUE: 4+/5 deltoid, bicep, tricep, HI. LUE:  4/5 delt, bicep, tricep, 4-wrist and HI. Positive left pronator drift. Sensation 1+/2 LUE and LLE. RLE: 4- HF, 4/5 KE and ADF/APF. LLE: 3+HF and KE and 1/5 ADF/APF. Sensation 1++/2 left foot to LT and pain.  Skin: Skin is warm and dry.  Psychiatric: He has a normal mood and affect. His behavior is normal. Judgment and thought content normal.    Assessment/Plan: 1. Functional deficits secondary to C6 SCI  which require 3+ hours per day of interdisciplinary therapy in a comprehensive inpatient rehab setting. Physiatrist is providing close team supervision and 24 hour management of active medical problems listed below. Physiatrist and rehab team continue to assess barriers to discharge/monitor patient progress toward functional and medical goals.  Function:  Bathing Bathing position   Position: Shower  Bathing parts Body parts bathed by patient: Right arm, Left arm, Chest, Abdomen, Front perineal area, Right upper leg, Left upper leg, Right lower leg, Left lower leg, Buttocks Body parts bathed by helper: Back  Bathing assist Assist Level: Supervision or verbal cues Assistive Device Comment: LH Sponge    Upper Body Dressing/Undressing Upper body dressing   What is the patient wearing?: Pull over shirt/dress     Pull over shirt/dress - Perfomed by patient: Thread/unthread right sleeve, Thread/unthread left sleeve, Put head through opening, Pull shirt over trunk Pull over shirt/dress - Perfomed by helper: Put head through opening, Pull shirt over trunk        Upper body assist Assist Level: Set up   Set up : To obtain clothing/put away, To apply TLSO, cervical collar  Lower Body Dressing/Undressing Lower body dressing  What is the patient wearing?: Pants, Underwear, Shoes, [employer]  - Performed by patient: Thread/unthread right underwear leg, Thread/unthread left underwear leg, Pull underwear up/down   Pants- Performed by patient: Thread/unthread right pants leg,  Thread/unthread left pants leg, Pull pants up/down   Non-skid slipper socks- Performed by patient: Don/doff right sock Non-skid slipper socks- Performed by helper: Don/doff left sock   Socks - Performed by helper: Don/doff right sock, Don/doff left sock Shoes - Performed by patient: Don/doff right shoe, Don/doff left shoe         TED Hose - Performed by helper: Don/doff right TED hose, Don/doff left TED hose  Lower body assist Assist Level: Supervision or verbal cues (close supervision)      Toileting Toileting Toileting activity did not occur: No continent bowel/bladder event Toileting steps completed by patient: Adjust clothing prior to toileting, Performs perineal hygiene, Adjust clothing after toileting Toileting steps completed by helper: Adjust clothing prior to toileting Toileting Assistive Devices: Grab bar or rail  Toileting assist Assist level: More than reasonable time   Transfers Chair/bed transfer   Chair/bed transfer method: Ambulatory Chair/bed transfer assist level: Supervision or verbal cues Chair/bed transfer assistive device: Orthosis, Environmental consultant, Air cabin crew     Max distance: 500 Assist level: Supervision or verbal cues   Wheelchair   Type: Manual Max wheelchair distance: 200 ft Assist Level: Supervision or verbal cues  Cognition Comprehension Comprehension assist level: Follows complex conversation/direction with extra time/assistive device  Expression Expression assist level: Expresses complex ideas: With no assist  Social Interaction Social Interaction assist level: Interacts appropriately with others - No medications needed.  Problem Solving Problem solving assist level: Solves complex problems: Recognizes & self-corrects  Memory Memory assist level: More than reasonable amount of time    Medical Problem List and Plan: 1. Functional deficits secondary to C6 SCI with incomplete myelopathy  -continues to progress. On track for  goals    2. DVT Prophylaxis/Anticoagulation: Pharmaceutical: Lovenox indicated 3. Pain Management: Will continue oxycodone prn.   -  kpad prn  -  Sports cream to left pec prn 4. Mood: LCSW to follow for evaluation and support.  5. Neuropsych: This patient is capable of making decisions on his own behalf. 6. Skin/Wound Care: Monitor wound daily. Continue wound care dialy.  7. Fluids/Electrolytes/Nutrition: Monitor I/O. Encourage po. Sodium sl low but otherwise bmet normal 8. ABLA: hgb stable at 9.4. continue fe++ supplement  9. Adrenal insufficiency: On cortef bid for supplement. 10 Hypothyroidism: On synthroid.  11. Hypogonadism: Depotestosterone every 8 weeks--follow up with patient regarding next dose  12. 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 To follow up with cardiology after discharge--currently in normal rhythm with rate control 13. H/o Acromegaly: Treated with Sandostatin? 14. Dysphagia: -advanced to regular diet   -speech eval/treatment appreciated  15. Edema: TEDS  - ACE wraps and elevation effective---continue as long as needed      LOS (Days) 10 A FACE TO FACE EVALUATION WAS PERFORMED  Kikuye Korenek T 09/28/2014 7:12 AM

## 2014-09-28 NOTE — Progress Notes (Signed)
Social Work Patient ID: Mark Gallagher, male   DOB: 1970-04-19, 44 y.o.   MRN: [id number]  CSW spoke with pt 09-27-14 to update him on team conference discussion.  Pt is pleased with his progress and shared this with CSW.  Pt was asking CSW questions about raising sofa and other surfaces and PT, [employer], entered as we were talking and CSW suggested pt discuss this further with her.  Pt also asked CSW about recommended supervision at home and if his parent could leave for a few hours at a time.  CSW looked to [facility] to answer this question, as well.  She feels pt would be okay to be alone for a chunk of time and they plan to work toward this goal with the time they have left.  CSW left a message for pt's parents to keep them updated on team conference discussion and pt's progress.  Await return call.  CSW also sent updated clinicals to [facility] yesterday and today and await continued stay approval from them.

## 2014-09-28 NOTE — Progress Notes (Signed)
Speech Language Pathology Session Note & Discharge Summary  Patient Details  Name: Mark Gallagher MRN: [id number] Date of Birth: October 09, 1970  Today's Date: 09/28/2014 SLP Individual Time: 1000-1055 SLP Individual Time Calculation (min): 55 min   Skilled Therapeutic Interventions:  Skilled treatment session focused on dysphagia goals. SLP facilitated session by providing skilled observation with snack of regular textures. Patient demonstrated efficient mastication with cough X 1, suspect due to talking with a full oral cavity. Patient reports he feels his swallowing function is at baseline and that he is tolerating his current diet without difficulty, therefore, patient will be discharged from skilled SLP intervention. Patient educated on recommendations and verbalized understanding and agreement. Patient left upright in wheelchair with all needs within reach.  Patient has met 3 of 3 long term goals.  Patient to discharge at overall Modified Independent level.   Reasons goals not met: N/A    Clinical Impression/Discharge Summary: Patient has made functional gains and has met 3 of 3 LTG's this admission due to improved swallowing function. Currently, patient is consuming regular textures with thin liquids without overt s/s of aspiration and is Mod I for use of swallowing compensatory strategies. Patient reports he feels his swallowing function is now at baseline and is reporting no difficulty at this time with his current diet, therefore, skilled SLP intervention is no longer warranted at this time.    Recommendation:  None      Equipment: N/A    Reasons for discharge: Treatment goals met   Patient/Family Agrees with Progress Made and Goals Achieved: Yes   Function:  Eating Eating   Modified Consistency Diet: No Eating Assist Level: Assistive Device Eating Assistive Device Comment: adaptive equipment         Cognition Comprehension Comprehension assist level: Follows complex  conversation/direction with extra time/assistive device  Expression   Expression assist level: Expresses complex ideas: With no assist  Social Interaction Social Interaction assist level: Interacts appropriately with others - No medications needed.  Problem Solving Problem solving assist level: Solves complex problems: Recognizes & self-corrects  Memory Memory assist level: More than reasonable amount of time   [name] 09/28/2014, 3:14 PM

## 2014-09-28 NOTE — Progress Notes (Signed)
Occupational Therapy Weekly Progress Note  Patient Details  Name: Mark Gallagher MRN: [id number] Date of Birth: [birth year]  Beginning of progress report period: September 21, 2014 End of progress report period: September 28, 2014   Patient has met 4 of 4 short term goals.  Pt has made steady progress with BADLs since admission and currently requires supervision/steady A to complete tasks.  Pt requires more than a reasonable amount of time to complete all tasks.  Pt requires min A/steady A for sit<>stand from lower surfaces.   Patient continues to demonstrate the following deficits: decreased I in self care, decreased I in balance, decreased I in functional mobility/transfers, decreased coordination and decreased safety awareness,  and therefore will continue to benefit from skilled OT intervention to enhance overall performance with BADL.  Patient progressing toward long term goals..  Continue plan of care.  OT Short Term Goals Week 1:  OT Short Term Goal 1 (Week 1): Pt will complete UB dressing with set-up OT Short Term Goal 1 - Progress (Week 1): Met OT Short Term Goal 2 (Week 1): Pt will complete toilet transfer with max A OT Short Term Goal 2 - Progress (Week 1): Met OT Short Term Goal 3 (Week 1): Pt will complete toileting task with steadying assist OT Short Term Goal 3 - Progress (Week 1): Met OT Short Term Goal 4 (Week 1): Pt will complete LB dressing with min A. OT Short Term Goal 4 - Progress (Week 1): Met Week 2:  OT Short Term Goal 1 (Week 2): STG=LTG secondary to ELOS      Therapy Documentation Precautions:  Precautions Precautions: Cervical Precaution Comments: [place] J cervical immobilizer Required Braces or Orthoses: Cervical Brace Cervical Brace: Hard collar, At all times Restrictions Weight Bearing Restrictions: No  See Function Navigator for Current Functional Status.     Leotis Shames [facility] 09/28/2014, 9:00 AM

## 2014-09-28 NOTE — Progress Notes (Signed)
Physical Therapy Session Note  Patient Details  Name: Mark Gallagher MRN: [id number] Date of Birth: January 14, 1971  Today's Date: 09/28/2014 PT Individual Time: 1305-1400 PT Individual Time Calculation (min): 55 min   Short Term Goals: Week 1:  PT Short Term Goal 1 (Week 1): Pt will perform bed mobility with min A PT Short Term Goal 1 - Progress (Week 1): Met PT Short Term Goal 2 (Week 1): Pt will perform bed <> chair transfers mod A of one person PT Short Term Goal 2 - Progress (Week 1): Met PT Short Term Goal 3 (Week 1): Pt will perform w/c mobility x 150' with supervision in controlled environment PT Short Term Goal 3 - Progress (Week 1): Met PT Short Term Goal 4 (Week 1): Pt will perform gait x 100' with LRAD and mod A of one person PT Short Term Goal 4 - Progress (Week 1): Met PT Short Term Goal 5 (Week 1): Pt will negotiate 4 steps with 2 rails and mod A of one person PT Short Term Goal 5 - Progress (Week 1): Met Week 2:  PT Short Term Goal 1 (Week 2): = LTGs  Skilled Therapeutic Interventions/Progress Updates:   Session focused on functional transfers with RW (close S to min A needed with cues for hand placement and technique), gait training with RW with focus on heel strike on L and L knee stability in stance, dynamic gait through obstacle course to simulate home and community mobility including sidestepping and stepping over simulated thresholds, neuro re-ed for balance retraining and coordination with and without UE support including ball toss/catch, standing kicks with alternating LE, and progressing to alternating between the two tasks at min A level, and addressing overall endurance and strengthening. On the way back to room with gait, used ACE wrap on L foot for increased dorsiflexion which has some improvement with increasing heel strike. Continue PT POC.   LTG's upgraded to mod I transfers and household gait as well as modified stair goal to reflect new d/c plan. See Care Plan  for details.   Therapy Documentation Precautions:  Precautions Precautions: Cervical Precaution Comments: [place] J cervical immobilizer Required Braces or Orthoses: Cervical Brace Cervical Brace: Hard collar, At all times Restrictions Weight Bearing Restrictions: No  Pain: 3/10 pain in neck and L pectoral region - rest breaks as needed and using heat. Declines pain medication at this time.   See Function Navigator for Current Functional Status.   Therapy/Group: Individual Therapy  Canary Brim Ivory Broad, PT, DPT  09/28/2014, 2:33 PM

## 2014-09-28 NOTE — Progress Notes (Signed)
Physical Therapy Session Note  Patient Details  Name: Mark Gallagher MRN: [id number] Date of Birth: 1970-07-12  Today's Date: 09/28/2014 PT Individual Time: 1100-1200 PT Individual Time Calculation (min): 60 min   Short Term Goals: Week 2:  PT Short Term Goal 1 (Week 2): = LTGs  Skilled Therapeutic Interventions/Progress Updates:   Session focused on functional ambulation, standing balance, LLE NMR, and activity tolerance. Patient performed sit <> stands using RW and ambulated to/from gym using RW with supervision. Patient demonstrates L hip hike and absent L knee flexion in terminal stance/initial swing phase with decreased ankle DF. Patient negotiated up/down 16 6-inch stairs using 2 rails for functional strengthening and access to second floor of home with close supervision and reciprocal pattern ascending/step-to pattern descending. Provided patient with left DF ace wrap assist for improved gait mechanics/increased DF to facilitate L heel strike during gait. Patient requested to work on gait in parallel bars without AD. Patient ambulated in // bars with min A and greatly increased compensatory strategies. Therefore remainder of session focused on swing phase and L heel strike with ball kicks using LLE, starting in split stance and progressed to walking forward while kicking ball with BUE support. Patient demonstrates decreased L hip hike, increased L knee flexion, and improved L heel strike following intervention. Performed sit <> stand from slightly raised mat to focus on functional BLE strengthening as patient noted to be very dependent on BUE during standing with hands on knees progressed to no UE support with mod-max A to facilitate forward weight shift during sit <> stand. Patient ambulated back to room and left sitting in wheelchair with all needs within reach.   Therapy Documentation Precautions:  Precautions Precautions: Cervical Precaution Comments: [place] J cervical  immobilizer Required Braces or Orthoses: Cervical Brace Cervical Brace: Hard collar, At all times Restrictions Weight Bearing Restrictions: No Pain: Pain Assessment Pain Assessment: No/denies pain  See Function Navigator for Current Functional Status.   Therapy/Group: Individual Therapy  Laretta Alstrom 09/28/2014, 12:17 PM

## 2014-09-29 ENCOUNTER — Inpatient Hospital Stay (HOSPITAL_COMMUNITY): Payer: Managed Care, Other (non HMO) | Admitting: Physical Therapy

## 2014-09-29 ENCOUNTER — Inpatient Hospital Stay (HOSPITAL_COMMUNITY): Payer: Managed Care, Other (non HMO) | Admitting: Occupational Therapy

## 2014-09-29 ENCOUNTER — Inpatient Hospital Stay (HOSPITAL_COMMUNITY): Payer: Managed Care, Other (non HMO)

## 2014-09-29 DIAGNOSIS — F4321 Adjustment disorder with depressed mood: Secondary | ICD-10-CM | POA: Insufficient documentation

## 2014-09-29 MED ORDER — NYSTATIN 100000 UNIT/ML MT SUSP
5.0000 mL | Freq: Four times a day (QID) | OROMUCOSAL | Status: AC
  Administered 2014-09-29 – 2014-10-03 (×17): 500000 [IU] via ORAL
  Filled 2014-09-29 (×20): qty 5

## 2014-09-29 MED ORDER — MENTHOL 3 MG MT LOZG
1.0000 | LOZENGE | OROMUCOSAL | Status: DC | PRN
  Administered 2014-09-30 – 2014-10-03 (×5): 3 mg via ORAL
  Filled 2014-09-29 (×10): qty 9

## 2014-09-29 NOTE — Progress Notes (Signed)
Physical Therapy Session Note  Patient Details  Name: Mark Gallagher MRN: [id number] Date of Birth: 08-Nov-1970  Today's Date: 09/29/2014 PT Individual Time: 0800-0900 PT Individual Time Calculation (min): 60 min   Short Term Goals: Week 2:  PT Short Term Goal 1 (Week 2): = LTGs  Skilled Therapeutic Interventions/Progress Updates:    Pt received ambulating out of bathroom with NA present with supervision and RW. Pt washed hands at sink with supervision. Pt sat on EOB while donning ted hose and shoes. Required maxA for donning TEDs, shoes with supervision with exception of tieing L shoe which pt states is due to L hip discomfort from recent fall. Ambulation to gym with RW and supervision x150'; noted LLE hip hiking and decrease L knee flexion, with foot drop and inversion. Stabilized pelvis and cues for knee pull-through with decrease in compensatory movements, however unable to maintain without stabilization. Stair training x12 stairs 6" height with supervision and BUEs; performed leading ascent with LLE for functional strengthening. Assessed LLE strength; no palpable contraction of tibialis anterior; educated pt on foot drop and use of brace to assist with gait and increase safety. Gait for 3 trials of 18' with PLS and GRAFO to assess function. Noted improvements in knee flexion, reduced hip hike during swing phase, with improved foot clearance. Pt returned to room and remained seated in w/c with all needs within reach and RN present at completion of session.   Therapy Documentation Precautions:  Precautions Precautions: Cervical Precaution Comments: [place] J cervical immobilizer Required Braces or Orthoses: Cervical Brace Cervical Brace: Hard collar, At all times Restrictions Weight Bearing Restrictions: No Pain: Pain Assessment Pain Assessment: 0-10 Pain Score: 2  Faces Pain Scale: Hurts a little bit Pain Type: Acute pain Pain Location: Head Pain Orientation: Mid Pain Descriptors /  Indicators: Headache Pain Onset: Gradual Patients Stated Pain Goal: 0 Pain Intervention(s): Ambulation/increased activity Multiple Pain Sites: No   See Function Navigator for Current Functional Status.   Therapy/Group: Individual Therapy  Luberta Mutter 09/29/2014, 12:14 PM

## 2014-09-29 NOTE — Progress Notes (Signed)
PHYSICAL MEDICINE & REHABILITATION     PROGRESS NOTE    Subjective/Complaints: Continues to progress functionally. Complains of sore throat  ROS: Pt denies fever, rash/itching, headache, blurred or double vision, nausea, vomiting, abdominal pain, diarrhea, chest pain, shortness of breath, palpitations, dysuria, dizziness, neck or back pain, bleeding, anxiety, or depression   Objective: Vital Signs: Blood pressure 112/74, pulse 60, temperature 98.3 F (36.8 C), temperature source Oral, resp. rate 18, height 6\' 8"  (2.032 m), weight 121.292 kg (267 lb 6.4 oz), SpO2 98 %. No results found. No results for input(s): WBC, HGB, HCT, PLT in the last 72 hours. No results for input(s): NA, K, CL, GLUCOSE, BUN, CREATININE, CALCIUM in the last 72 hours.  Invalid input(s): CO CBG (last 3)  No results for input(s): GLUCAP in the last 72 hours.  Wt Readings from Last 3 Encounters:  09/20/14 121.292 kg (267 lb 6.4 oz)  08/28/14 109.997 kg (242 lb 8 oz)  08/23/14 113.399 kg (250 lb)    Physical Exam:  Constitutional: He is oriented to person, place, and time. He appears well-developed and well-nourished. Large frame/head HENT: thrush on tongue Head: Atraumatic.  Eyes: Conjunctivae and EOM are normal. Pupils are equal, round, and reactive to light. Right eye exhibits no discharge.  Neck:  Cervical collar fitting appropriately Cardiovascular: Normal rate and regular rhythm.  No murmur heard. Respiratory: Effort normal and breath sounds normal. No respiratory distress. He has no wheezes. He exhibits no tenderness.  GI: Soft. Bowel sounds are normal. He exhibits no distension. There is no tenderness.  Musculoskeletal: He exhibits improving edema ( trace   pedal edema bilaterally---none in legs).   no edema left hand   Pain over left pec with palpation is improved Neurological: He is alert and oriented to person, place, and time. RUE: 4+/5 deltoid, bicep, tricep, HI. LUE: 4/5  delt, bicep, tricep, 4-wrist and HI. Positive left pronator drift. Sensation 1+/2 LUE and LLE. RLE: 4- HF, 4/5 KE and ADF/APF. LLE: 3+HF and KE and 1/5 ADF/APF. Sensation 1++/2 left foot to LT and pain.  Skin: Skin is warm and dry.  Psychiatric: He has a normal mood and affect. His behavior is normal. Judgment and thought content normal.    Assessment/Plan: 1. Functional deficits secondary to C6 SCI  which require 3+ hours per day of interdisciplinary therapy in a comprehensive inpatient rehab setting. Physiatrist is providing close team supervision and 24 hour management of active medical problems listed below. Physiatrist and rehab team continue to assess barriers to discharge/monitor patient progress toward functional and medical goals.  Function:  Bathing Bathing position   Position: Shower  Bathing parts Body parts bathed by patient: Right arm, Left arm, Chest, Abdomen, Front perineal area, Right upper leg, Left upper leg, Right lower leg, Left lower leg, Buttocks, Back Body parts bathed by helper: Back  Bathing assist Assist Level: Supervision or verbal cues Assistive Device Comment: LH Sponge    Upper Body Dressing/Undressing Upper body dressing   What is the patient wearing?: Pull over shirt/dress     Pull over shirt/dress - Perfomed by patient: Thread/unthread right sleeve, Thread/unthread left sleeve, Put head through opening, Pull shirt over trunk Pull over shirt/dress - Perfomed by helper: Put head through opening, Pull shirt over trunk        Upper body assist Assist Level: Set up   Set up : To obtain clothing/put away, To apply TLSO, cervical collar  Lower Body Dressing/Undressing Lower body dressing   What  is the patient wearing?: Pants, Underwear, Shoes, [employer]  - Performed by patient: Thread/unthread right underwear leg, Thread/unthread left underwear leg, Pull underwear up/down   Pants- Performed by patient: Thread/unthread right pants leg,  Thread/unthread left pants leg, Pull pants up/down   Non-skid slipper socks- Performed by patient: Don/doff right sock Non-skid slipper socks- Performed by helper: Don/doff right sock, Don/doff left sock   Socks - Performed by helper: Don/doff right sock, Don/doff left sock Shoes - Performed by patient: Don/doff right shoe, Don/doff left shoe         TED Hose - Performed by helper: Don/doff right TED hose, Don/doff left TED hose  Lower body assist Assist Level: Supervision or verbal cues      Toileting Toileting Toileting activity did not occur: No continent bowel/bladder event Toileting steps completed by patient: Adjust clothing prior to toileting, Performs perineal hygiene, Adjust clothing after toileting Toileting steps completed by helper: Adjust clothing prior to toileting Toileting Assistive Devices: Grab bar or rail  Toileting assist Assist level: More than reasonable time   Transfers Chair/bed transfer   Chair/bed transfer method: Ambulatory Chair/bed transfer assist level: Touching or steadying assistance (Pt > 75%) Chair/bed transfer assistive device: Walker, Air cabin crew     Max distance: 200 ft Assist level: Supervision or verbal cues   Wheelchair   Type: Manual Max wheelchair distance: 200 ft Assist Level: Supervision or verbal cues  Cognition Comprehension Comprehension assist level: Follows complex conversation/direction with no assist  Expression Expression assist level: Expresses complex ideas: With no assist  Social Interaction Social Interaction assist level: Interacts appropriately with others - No medications needed.  Problem Solving Problem solving assist level: Solves complex problems: Recognizes & self-corrects  Memory Memory assist level: More than reasonable amount of time    Medical Problem List and Plan: 1. Functional deficits secondary to C6 SCI with incomplete myelopathy   2. DVT Prophylaxis/Anticoagulation:  Pharmaceutical: Lovenox indicated 3. Pain Management: Will continue oxycodone prn.   -  kpad prn  -  Sports cream to left pec prn 4. Mood: LCSW to follow for evaluation and support.  5. Neuropsych: This patient is capable of making decisions on his own behalf. 6. Skin/Wound Care: Monitor wound daily. Continue wound care dialy.  7. Fluids/Electrolytes/Nutrition: Monitor I/O. Encourage po. Sodium sl low but otherwise bmet normal 8. ABLA: hgb stable at 9.4. continue fe++ supplement  9. Adrenal insufficiency: On cortef bid for supplement. 10 Hypothyroidism: On synthroid.  11. Hypogonadism: Depotestosterone every 8 weeks--follow up with patient regarding next dose  12. 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 To follow up with cardiology after discharge--currently in normal rhythm with rate control 13. H/o Acromegaly: Treated with Sandostatin? 14. Dysphagia: -advanced to regular diet     15. Edema: TEDS  - ACE wraps and elevation effective---continue as long as needed  16. Thrush: nystatin rinse     LOS (Days) 11 A FACE TO FACE EVALUATION WAS PERFORMED  Mark Gallagher,Mark Gallagher 09/29/2014 8:12 AM

## 2014-09-29 NOTE — Progress Notes (Signed)
Physical Therapy Session Note  Patient Details  Name: Mark Gallagher MRN: [id number] Date of Birth: [birth year]  Today's Date: 09/29/2014 PT Individual Time: 1300-1330 PT Individual Time Calculation (min): 30 min   Short Term Goals: Week 2:  PT Short Term Goal 1 (Week 2): = LTGs  Skilled Therapeutic Interventions/Progress Updates:    Pt received up in w/c with sister present entire session. Pt agreeable to participate in therapy, but sad his LOS may be cut short. Therapeutic Activity - see function tab for transfer and bed mobility details. PT instructs pt in hand placement when rolling R/L. PT instructs pt in car transfer at simulated [place] height req up to min A overall to stand from low car. PT instructs pt to practice x 5 reps total with various hand placements, including family training of sister stabilizing RW and providing CGA at pt's mid back - pt and sister verbalize and demonstrate understanding and comfort with this assist. Therapeutic Exercise: PT instructs pt in L LE strengthening exercises: ankle PF with orange resistance band around metatarsal heads x 10 reps, L SLR x 10 reps, and bilateral hip ER in supine hook lie with orange resistance band around knees. Pt unable to perform a clam shell or side lie hip abduction and c/o significant pain in L hip when attempting, but the hook lie exercise is fine. Pt assisted back to room and w/c with sister present. Pt continues to req an AFO for L DF assist, maintaining ankle in neutral inversion/eversion, and assisting in L knee stability with gait in order to improve safety. Continue per PT POC.   Therapy Documentation Precautions:  Precautions Precautions: Cervical Precaution Comments: [place] J cervical immobilizer Required Braces or Orthoses: Cervical Brace Cervical Brace: Hard collar, At all times Restrictions Weight Bearing Restrictions: No Pain: Pain Assessment Pain Assessment: No/denies pain   See Function Navigator for  Current Functional Status.   Therapy/Group: Individual Therapy  Rhianne Soman M 09/29/2014, 4:26 PM

## 2014-09-29 NOTE — Progress Notes (Signed)
Orthopedic Tech Progress Note Patient Details:  Mark Gallagher February 09, 1970 [id number] Brace order completed by [employer] vendor. Patient ID: KORT STETTLER, male   DOB: 05/06/1970, 44 y.o.   MRN: [id number]   Braulio Bosch 09/29/2014, 4:14 PM

## 2014-09-29 NOTE — Progress Notes (Signed)
Occupational Therapy Session Note  Patient Details  Name: Mark Gallagher MRN: [id number] Date of Birth: March 02, 1970  Today's Date: 09/29/2014 OT Individual Time: 1100-1200 OT Individual Time Calculation (min): 60 min    Short Term Goals: Week 2:  OT Short Term Goal 1 (Week 2): STG=LTG secondary to ELOS  Skilled Therapeutic Interventions/Progress Updates: Pt received in w/c awaiting therapist although tearful d/t anxiety relating to unplanned imminent discharge.   OT provides emotional support and encourages pt to progress through planned task, bathing/dressing at shower level.   Pt engages in session and is able to direct caregiver for needed setup assist.   Pt ambulates to shower with standby assist and doffs clothing sitting/standing from tub bench with steadying assist.   Pt bathes below level of his neck d/t hard collar and requests assist to wash back; pt uses LH sponge to wash his feet.   Pt dresses in bathroom with steadying assist while donning pants and mod assist to don TEDs and shoes.  Pt reports attempts at using AE for lower body dressing are unproductive d/t decreased left hand [place] and limited reach, restricted by orthotics.    Pt requests setup assist to self-feed at end of session.   Pt reports that his home has not been modified as needed with grab bars and rails and he is fearful of falling.   Pt reports need for BSC and tub bench.   OT alerts SW to pt's concerns and equipment needs; staff aware and in process of advocating for pt's needs.     Therapy Documentation Precautions:  Precautions Precautions: Cervical Precaution Comments: [place] J cervical immobilizer Required Braces or Orthoses: Cervical Brace Cervical Brace: Hard collar, At all times Restrictions Weight Bearing Restrictions: No  Pain: Pain Assessment Pain Assessment: 0-10 Pain Score: 2  Faces Pain Scale: Hurts a little bit Pain Type: Acute pain Pain Location: Head Pain Orientation: Mid Pain Descriptors /  Indicators: Headache Pain Onset: Gradual Patients Stated Pain Goal: 0 Pain Intervention(s): Ambulation/increased activity Multiple Pain Sites: No  See Function Navigator for Current Functional Status.   Therapy/Group: Individual Therapy  Trueman Worlds 09/29/2014, 1:22 PM

## 2014-09-29 NOTE — Progress Notes (Signed)
Social Work Patient ID: Mark Gallagher, male   DOB: July 29, 1970, 44 y.o.   MRN: [id number]   CSW received a phone call this morning at 8:30 am from Koren Bound, [employer] case manager, stating that pt's last covered day was indeed 09-27-15 even after updated clinicals sent.  CSW asked that this decision be appealed and requested a peer to peer with [facility] and Dr. Naaman Plummer. Meanwhile, CSW talked with pt and pt's sister to update them on the situation.  Obviously, pt and sister were upset and sister kept pt's parents updated.  CSW explained appeal and peer to peer to them.  This occurred today and pt is authorized through 10-04-14.  CSW informed pt/family and staff working with pt.  Pt/sister were very pleased with the extension of his therapy.  CSW to continue to make arrangements for pt's d/c.  Pt to have outpt therapy and DME and AFO.

## 2014-09-29 NOTE — Progress Notes (Signed)
Physical Therapy Session Note  Patient Details  Name: Mark Gallagher MRN: [id number] Date of Birth: Nov 17, 1970  Today's Date: 09/29/2014 PT Individual Time: 1400-1430 PT Individual Time Calculation (min): 30 min   Short Term Goals: Week 2:  PT Short Term Goal 1 (Week 2): = LTGs  Skilled Therapeutic Interventions/Progress Updates:    Pt received sitting in w/c and agreeable to therapy.  Pt ambulated to/from therapy gym with RW and supervision with verbal cues for L knee control during gait and upright posture.  PT instructed patient in LLE D1 PNF pattern x2 minutes PROM, x20 reps AROM, and 2x10 reps with resistance from PT. PT provided verbal and tactile cues for correct form and muscle control.  Pt demonstrates improved L knee control during ambulation returning to room where he was positioned in w/c with call bell in reach and needs met.    Therapy Documentation Precautions:  Precautions Precautions: Cervical Precaution Comments: [place] J cervical immobilizer Required Braces or Orthoses: Cervical Brace Cervical Brace: Hard collar, At all times Restrictions Weight Bearing Restrictions: No General:   Vital Signs: Therapy Vitals Temp: 97.4 F (36.3 C) Temp Source: Oral Pulse Rate: 66 Resp: 20 BP: 128/78 mmHg Patient Position (if appropriate): Sitting Oxygen Therapy SpO2: 98 % O2 Device: Not Delivered Pain: Pain Assessment Pain Assessment: No/denies pain Mobility:   Locomotion :    Trunk/Postural Assessment :    Balance:   Exercises:   Other Treatments:     See Function Navigator for Current Functional Status.   Therapy/Group: Individual Therapy  Earnest Conroy Penven-Crew 09/29/2014, 4:14 PM

## 2014-09-29 NOTE — Consult Note (Signed)
INITIAL PSYCHODIAGNOSTIC EXAMINATION - [place]   [name] is a 44 year old man, who was seen for an initial psychodiagnostic examination to evaluate his emotional state following spinal and cardiac surgery.  According to his medical record, he was admitted to [facility] on 09/09/14 for C2-T3 decompression and fusion.  On 09/12/14 he developed symptoms that ultimately required a cardiac ablation procedure on 09/16/14, which was felt to successfully resolve precipitating symptoms.  Although it is reportedly improving, owing to resultant left upper extremity weakness and bilateral lower extremity weakness, he was deemed appropriate for inpatient rehabilitation.    During the clinical interview, Mark Gallagher described general optimism for recovery and stated that he has found success with using faith (e.g. prayer) and taking one day at a time to help keep him motivated throughout his physical recovery.  He also stated that he is able to appreciate small improvements that he is making physically and that he has significant support from his family.  However, he noted that he desires to return to work eventually and expressed concern that he may not be physically ready to return when his short-term disability timeframe is over.  He was tearful when discussing this.  He acknowledged that when he thinks about his future and it is uncertain, he sometimes experiences low mood.  However, he denied other symptoms of depression, including suicidal ideation and he denied any history of major depression.  His fears and frustrations were validated and normalized.  Time was spent challenging his fears and exploring his options in case he is unable to return to work.  He was able to consider alternative plans that he felt would suit him, but said that changing his visions of his future is difficult.    In addition to the aforementioned more pronounced concerns,  he mentioned that he has had trouble sleeping on the unit due to nighttime frequency of urination and noise on the unit.  He plans to start listening to an "ocean waves" CD that his niece gave him to hopefully drown out ancillary noise and improve his sleep.    IMPRESSIONS AND RECOMMENDATIONS: Mark Gallagher seems to be experiencing mild depressed mood in response to his physical limitations, which likely represents an adjustment disorder.  As such, he does not likely require medication intervention to improve his mood.  He would, however, likely benefit from continued psychological support while on the unit.  Follow-up with the neuropsychologist can be requested for that purpose, as time allows.  In addition, he could consider engaging in individual psychotherapy post-discharge and contact information on providers in his area should be included in his discharge paperwork for that purpose.    DIAGNOSIS:   Adjustment disorder with depressed mood  Marlane Hatcher, Psy.D.  Clinical Neuropsychologist

## 2014-09-29 NOTE — Progress Notes (Signed)
Orthopedic Tech Progress Note Patient Details:  Mark Gallagher [birth year] [id number]  Patient ID: Mark Gallagher, male   DOB: April 19, 1970, 44 y.o.   MRN: [id number] Called in advanced brace order; spoke with Mark Gallagher, Mark Gallagher 09/29/2014, 12:44 PM

## 2014-09-29 NOTE — Progress Notes (Signed)
Occupational Therapy Session Note  Patient Details  Name: Mark Gallagher MRN: [id number] Date of Birth: August 05, 1970  Today's Date: 09/29/2014 OT Individual Time: 1500-1557 OT Individual Time Calculation (min): 57 min    Short Term Goals: Week 2:  OT Short Term Goal 1 (Week 2): STG=LTG secondary to ELOS  Skilled Therapeutic Interventions/Progress Updates:   Pt received in hallway, family present and pt just fitted for new AFO this session. His sister, Ailene Ravel, remained to observe therapy session. Pt ambulated with use of RW and AFO donned on L LE 1000+ feet this session. Pt getting onto elevator and ambulating in gift shop. Pt able to navigate tight aisles by side stepping with RW and min verbal guidance to do so. Pt taking 3 standing rest breaks this session for fatigue but never sitting. Pt then ambulated further with RW on various outside surfaces with continued overall supervision. Pt requiring min verbal cues to navigate back to room. OT educated pt and sister on outpatient OT services and answered questions regarding any concerns they had with this next venue of care. All questions answered. Pt seated on EOB and calling for pain medication and sister remaining present in room as therapist exited.   Therapy Documentation Precautions:  Precautions Precautions: Cervical Precaution Comments: [place] J cervical immobilizer Required Braces or Orthoses: Cervical Brace Cervical Brace: Hard collar, At all times Restrictions Weight Bearing Restrictions: No Vital Signs: Therapy Vitals Temp: 97.4 F (36.3 C) Temp Source: Oral Pulse Rate: 66 Resp: 20 BP: 128/78 mmHg Patient Position (if appropriate): Sitting Oxygen Therapy SpO2: 98 % O2 Device: Not Delivered Pain: Pain Assessment Pain Assessment: No/denies pain  See Function Navigator for Current Functional Status.   Therapy/Group: Individual Therapy  Phineas Semen 09/29/2014, 4:20 PM

## 2014-09-30 ENCOUNTER — Inpatient Hospital Stay (HOSPITAL_COMMUNITY): Payer: Managed Care, Other (non HMO) | Admitting: Physical Therapy

## 2014-09-30 DIAGNOSIS — D62 Acute posthemorrhagic anemia: Secondary | ICD-10-CM

## 2014-09-30 DIAGNOSIS — G8250 Quadriplegia, unspecified: Secondary | ICD-10-CM

## 2014-09-30 DIAGNOSIS — E220 Acromegaly and pituitary gigantism: Secondary | ICD-10-CM

## 2014-09-30 MED ORDER — PHENOL 1.4 % MT LIQD
1.0000 | OROMUCOSAL | Status: DC | PRN
  Administered 2014-09-30: 1 via OROMUCOSAL
  Filled 2014-09-30 (×2): qty 177

## 2014-09-30 NOTE — Progress Notes (Signed)
Cepacol lozenge given.wbb

## 2014-09-30 NOTE — Progress Notes (Signed)
Physical Therapy Session Note  Patient Details  Name: Mark Gallagher MRN: [id number] Date of Birth: 10/10/1970  Today's Date: 09/30/2014 PT Individual Time: 1020-1105 PT Individual Time Calculation (min): 45 min   Short Term Goals: Week 2:  PT Short Term Goal 1 (Week 2): = LTGs  Skilled Therapeutic Interventions/Progress Updates:   Session focused on functional mobility training, NMR, standing balance, and activity tolerance. Patient c/o sore throat, requesting to postpone therapy until receiving medication, missed 15 minutes of skilled intervention. Gait training with L AFO donned using RW 2 x 200 ft with supervision overall. Stair training up/down 16 stairs using 2 rails with step-to pattern, decreased L foot clearance despite AFO donned and required max verbal cues for L hip flexion to clear step, supervision overall with one episode of min A due to getting L foot caught behind R foot. Neuro re-ed from slightly raised mat working on sit <> stands without UE support and anterior weight shift, mod A > min A to facilitate weight shifting and max verbal/tactile cues for technique. Standing alternating R/L step taps without UE support to 4" step, mod-max A for balance and weight shifting. Upon returning to room, patient requested to return to bed. Patient left supine in bed with all needs within reach.   Therapy Documentation Precautions:  Precautions Precautions: Cervical Precaution Comments: [place] J cervical immobilizer Required Braces or Orthoses: Cervical Brace Cervical Brace: Hard collar, At all times Restrictions Weight Bearing Restrictions: No Pain: Pain Assessment Pain Assessment: Faces Faces Pain Scale: Hurts whole lot Pain Type: Acute pain Pain Location: Throat Pain Descriptors / Indicators: Discomfort;Grimacing (painful to speak and swallow) Pain Onset: On-going Pain Intervention(s): RN made aware;Emotional support  See Function Navigator for Current Functional  Status.   Therapy/Group: Individual Therapy  Laretta Alstrom 09/30/2014, 11:09 AM

## 2014-09-30 NOTE — Progress Notes (Signed)
Mark Gallagher is a 44 y.o. male 08-13-1970 [id number]  Subjective: No new complaints. C/o ST - not new. No new problems. Slept well. Feeling OK.  Objective: Vital signs in last 24 hours: Temp:  [97.4 F (36.3 C)-97.6 F (36.4 C)] 97.6 F (36.4 C) (09/10 0623) Pulse Rate:  [59-66] 59 (09/10 0623) Resp:  [18-20] 18 (09/10 0623) BP: (117-128)/(78) 117/78 mmHg (09/10 0623) SpO2:  [95 %-98 %] 95 % (09/10 0623) Weight change:  Last BM Date: 09/28/14  Intake/Output from previous day: 09/09 0701 - 09/10 0700 In: 1080 [P.O.:1080] Out: 400 [Urine:400] Last cbgs: CBG (last 3)  No results for input(s): GLUCAP in the last 72 hours.   Physical Exam General: No apparent distress   HEENT: not dry Lungs: Normal effort. Lungs clear to auscultation, no crackles or wheezes. Cardiovascular: Regular rate and rhythm, no edema Abdomen: S/NT/ND; BS(+) Musculoskeletal: in a w/c unchanged - acromegaly Neurological: No new neurological deficits Wounds: N/A    Skin: clear  Aging changes Mental state: Alert, oriented, cooperative, talkative    Lab Results: BMET    Component Value Date/Time   NA 133* 09/19/2014 0601   K 3.7 09/19/2014 0601   CL 98* 09/19/2014 0601   CO2 29 09/19/2014 0601   GLUCOSE 105* 09/19/2014 0601   BUN 5* 09/19/2014 0601   CREATININE 0.58* 09/19/2014 0601   CREATININE 0.64 09/08/2013 1221   CALCIUM 8.4* 09/19/2014 0601   GFRNONAA >60 09/19/2014 0601   GFRNONAA >89 09/08/2013 1221   GFRAA >60 09/19/2014 0601   GFRAA >89 09/08/2013 1221   CBC    Component Value Date/Time   WBC 4.1 09/25/2014 0519   WBC 4.9 08/23/2014 1452   RBC 3.02* 09/25/2014 0519   RBC 4.16* 08/23/2014 1452   HGB 9.4* 09/25/2014 0519   HGB 12.8* 08/23/2014 1452   HCT 28.8* 09/25/2014 0519   HCT 37.9* 08/23/2014 1452   PLT 304 09/25/2014 0519   MCV 95.4 09/25/2014 0519   MCV 90.9 08/23/2014 1452   MCH 31.1 09/25/2014 0519   MCH 30.6 08/23/2014 1452   MCHC 32.6 09/25/2014 0519   MCHC 33.7 08/23/2014 1452   RDW 14.3 09/25/2014 0519   LYMPHSABS 1.2 09/19/2014 0601   MONOABS 0.5 09/19/2014 0601   EOSABS 0.1 09/19/2014 0601   BASOSABS 0.0 09/19/2014 0601    Studies/Results: No results found.  Medications: I have reviewed the patient's current medications.  Assessment/Plan:  1. Functional deficits secondary to C6 SCI with incomplete myelopathy  2. DVT Prophylaxis/Anticoagulation: Pharmaceutical: Lovenox indicated 3. Pain Management: Will continue oxycodone prn.  - kpad prn - Sports cream to left pec prn 4. Mood: LCSW to follow for evaluation and support.  5. Neuropsych: This patient is capable of making decisions on his own behalf. 6. Skin/Wound Care: Monitor wound daily. Continue wound care dialy.  7. Fluids/Electrolytes/Nutrition: Monitor I/O. Encourage po. Sodium sl low but otherwise bmet normal 8. ABLA: hgb stable at 9.4. continue fe++ supplement 9. Adrenal insufficiency: On cortef bid for supplement. 10 Hypothyroidism: On synthroid.  11. Hypogonadism: Depotestosterone every 8 weeks--follow up with patient regarding next dose  12. 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 To follow up with cardiology after discharge--currently in normal rhythm with rate control 13. H/o Acromegaly: Treated with Sandostatin? 14. Dysphagia: -advanced to regular diet    15. Edema: TEDS - ACE wraps and elevation effective---continue as long as needed 16. Thrush: nystatin rinse, chloraseptic prn    Length of stay, days: 12  [name] ,  MD 09/30/2014, 9:14 AM

## 2014-10-01 ENCOUNTER — Inpatient Hospital Stay (HOSPITAL_COMMUNITY): Payer: Managed Care, Other (non HMO) | Admitting: Physical Therapy

## 2014-10-01 MED ORDER — FLUTICASONE PROPIONATE 50 MCG/ACT NA SUSP
2.0000 | Freq: Every day | NASAL | Status: DC
  Administered 2014-10-01 – 2014-10-05 (×5): 2 via NASAL
  Filled 2014-10-01: qty 16

## 2014-10-01 NOTE — Progress Notes (Signed)
Mark Gallagher is a 44 y.o. male 07/17/1970 [id number]  Subjective: No new complaints. C/o post-nasal drip and cough. No new problems otherwise. Slept well. Feeling OK.  Objective: Vital signs in last 24 hours: Temp:  [98.1 F (36.7 C)-98.4 F (36.9 C)] 98.4 F (36.9 C) (09/11 0610) Pulse Rate:  [70-78] 78 (09/11 0610) Resp:  [18] 18 (09/11 0610) BP: (109-125)/(78-80) 109/78 mmHg (09/11 0610) SpO2:  [97 %-98 %] 97 % (09/11 0610) Weight change:  Last BM Date: 09/28/14  Intake/Output from previous day: 09/10 0701 - 09/11 0700 In: 360 [P.O.:360] Out: -  Last cbgs: CBG (last 3)  No results for input(s): GLUCAP in the last 72 hours.   Physical Exam General: No apparent distress   HEENT: not dry, clear Lungs: Normal effort. Lungs clear to auscultation, no crackles or wheezes. Cardiovascular: Regular rate and rhythm, no edema Abdomen: S/NT/ND; BS(+) Musculoskeletal: in a w/c unchanged - acromegaly Neurological: No new neurological deficits Wounds: N/A    Skin: clear  Mental state: Alert, oriented, cooperative    Lab Results: BMET    Component Value Date/Time   NA 133* 09/19/2014 0601   K 3.7 09/19/2014 0601   CL 98* 09/19/2014 0601   CO2 29 09/19/2014 0601   GLUCOSE 105* 09/19/2014 0601   BUN 5* 09/19/2014 0601   CREATININE 0.58* 09/19/2014 0601   CREATININE 0.64 09/08/2013 1221   CALCIUM 8.4* 09/19/2014 0601   GFRNONAA >60 09/19/2014 0601   GFRNONAA >89 09/08/2013 1221   GFRAA >60 09/19/2014 0601   GFRAA >89 09/08/2013 1221   CBC    Component Value Date/Time   WBC 4.1 09/25/2014 0519   WBC 4.9 08/23/2014 1452   RBC 3.02* 09/25/2014 0519   RBC 4.16* 08/23/2014 1452   HGB 9.4* 09/25/2014 0519   HGB 12.8* 08/23/2014 1452   HCT 28.8* 09/25/2014 0519   HCT 37.9* 08/23/2014 1452   PLT 304 09/25/2014 0519   MCV 95.4 09/25/2014 0519   MCV 90.9 08/23/2014 1452   MCH 31.1 09/25/2014 0519   MCH 30.6 08/23/2014 1452   MCHC 32.6 09/25/2014 0519   MCHC 33.7  08/23/2014 1452   RDW 14.3 09/25/2014 0519   LYMPHSABS 1.2 09/19/2014 0601   MONOABS 0.5 09/19/2014 0601   EOSABS 0.1 09/19/2014 0601   BASOSABS 0.0 09/19/2014 0601    Studies/Results: No results found.  Medications: I have reviewed the patient's current medications.  Assessment/Plan:  1. Functional deficits secondary to C6 SCI with incomplete myelopathy  2. DVT Prophylaxis/Anticoagulation: Pharmaceutical: Lovenox indicated 3. Pain Management: Will continue oxycodone prn.  - kpad prn - Sports cream to left pec prn 4. Mood: LCSW to follow for evaluation and support.  5. Neuropsych: This patient is capable of making decisions on his own behalf. 6. Skin/Wound Care: Monitor wound daily. Continue wound care dialy.  7. Fluids/Electrolytes/Nutrition: Monitor I/O. Encourage po. Sodium sl low but otherwise bmet normal 8. ABLA: hgb stable at 9.4. continue fe++ supplement 9. Adrenal insufficiency: On cortef bid for supplement. 10 Hypothyroidism: On synthroid.  11. Hypogonadism: Depotestosterone every 8 weeks--follow up with patient regarding next dose  12. 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 To follow up with cardiology after discharge--currently in normal rhythm with rate control 13. H/o Acromegaly: Treated with Sandostatin? 14. Dysphagia: -advanced to regular diet    15. Edema: TEDS - ACE wraps and elevation effective---continue as long as needed 16. Thrush: nystatin rinse, chloraseptic prn 17. Post-nasal drip. Start [place]. May add Afrin    Length  of stay, days: [place] , MD 10/01/2014, 8:49 AM

## 2014-10-01 NOTE — Progress Notes (Signed)
Cepacol lozenge given.wbb

## 2014-10-01 NOTE — Progress Notes (Signed)
Congested productive cough, thick, clear secretions,A; robitussin Dm given.wbb

## 2014-10-01 NOTE — Progress Notes (Signed)
Physical Therapy Session Note  Patient Details  Name: Mark Gallagher MRN: [id number] Date of Birth: 07-30-1970  Today's Date: 10/01/2014 PT Individual Time: 6979-4801 PT Individual Time Calculation (min): 45 min   Short Term Goals: Week 1:  PT Short Term Goal 1 (Week 1): Pt will perform bed mobility with min A PT Short Term Goal 1 - Progress (Week 1): Met PT Short Term Goal 2 (Week 1): Pt will perform bed <> chair transfers mod A of one person PT Short Term Goal 2 - Progress (Week 1): Met PT Short Term Goal 3 (Week 1): Pt will perform w/c mobility x 150' with supervision in controlled environment PT Short Term Goal 3 - Progress (Week 1): Met PT Short Term Goal 4 (Week 1): Pt will perform gait x 100' with LRAD and mod A of one person PT Short Term Goal 4 - Progress (Week 1): Met PT Short Term Goal 5 (Week 1): Pt will negotiate 4 steps with 2 rails and mod A of one person PT Short Term Goal 5 - Progress (Week 1): Met Week 2:  PT Short Term Goal 1 (Week 2): = LTGs Week 3:    Week 4:     Skilled Therapeutic Interventions/Progress Updates:      Therapy Documentation Precautions:  Precautions Precautions: Cervical Precaution Comments: [place] J cervical immobilizer Required Braces or Orthoses: Cervical Brace Cervical Brace: Hard collar, At all times Restrictions Weight Bearing Restrictions: No General:   Vital Signs:   Pain: Pain Assessment Pain Assessment: 0-10 Pain Score: 2  Pain Location: Neck Pain Descriptors / Indicators:  (graded activity) Pain Onset: On-going Pain Intervention(s): Medication (See eMAR) Mobility:  SBA with cues for weight shift Locomotion :   SBA with RW with cues for AD, Mod A 40' with cues for posture, weight shift, and sequencing Other Treatments:  Pt educated on rehab plan, progressing mobility, and role of core stability. Heel raises, standing B/L weight shifts, toe-taps, marching 2x10. Pre-gait BLE advancement 2x10. Transfers x10 in session.  Static standing without AD 2'x2. Pt performs sitting dynamic balance with dual motor tasks within functional context.   See Function Navigator for Current Functional Status.   Therapy/Group: Individual Therapy  Monia Pouch 10/01/2014, 1:35 PM

## 2014-10-02 ENCOUNTER — Inpatient Hospital Stay (HOSPITAL_COMMUNITY): Payer: Managed Care, Other (non HMO) | Admitting: Occupational Therapy

## 2014-10-02 ENCOUNTER — Inpatient Hospital Stay (HOSPITAL_COMMUNITY): Payer: Managed Care, Other (non HMO)

## 2014-10-02 ENCOUNTER — Inpatient Hospital Stay (HOSPITAL_COMMUNITY): Payer: Managed Care, Other (non HMO) | Admitting: Physical Therapy

## 2014-10-02 MED ORDER — BROMPHENIRAMINE-PSEUDOEPH 1-15 MG/5ML PO ELIX
5.0000 mL | ORAL_SOLUTION | Freq: Three times a day (TID) | ORAL | Status: DC
  Filled 2014-10-02 (×2): qty 5

## 2014-10-02 MED ORDER — DM-GUAIFENESIN ER 30-600 MG PO TB12: 1.0000 | ORAL_TABLET | Freq: Two times a day (BID) | ORAL | Status: DC

## 2014-10-02 MED ORDER — LORATADINE 10 MG PO TABS
10.0000 mg | ORAL_TABLET | Freq: Every day | ORAL | Status: DC
  Administered 2014-10-02 – 2014-10-05 (×4): 10 mg via ORAL
  Filled 2014-10-02 (×4): qty 1

## 2014-10-02 MED ORDER — PSEUDOEPHEDRINE HCL ER 120 MG PO TB12
120.0000 mg | ORAL_TABLET | Freq: Two times a day (BID) | ORAL | Status: DC
  Administered 2014-10-02 – 2014-10-04 (×5): 120 mg via ORAL
  Filled 2014-10-02 (×9): qty 1

## 2014-10-02 NOTE — Progress Notes (Signed)
Occupational Therapy Session Note  Patient Details  Name: Mark Gallagher MRN: [id number] Date of Birth: 1970/05/08  Today's Date: 10/02/2014 OT Individual Time: 1310-1400 OT Individual Time Calculation (min): 50 min    Short Term Goals: Week 2:  OT Short Term Goal 1 (Week 2): STG=LTG secondary to ELOS  Skilled Therapeutic Interventions/Progress Updates:    Pt seen for OT therapy session with co-tx with recreational therapy. Pt sitting up in w/c upon arrival, agreeable to tx session. Pt ambulated throughout unit with RW and supervision with min VCs provided for RW management. In therapy day room, pt participated in game of [place] with emphasis on fine motor coordination, as pt required to roll and grasp/releae small dice and using pen to write out scores. He demonstrated functional pincer grasp and finger opposition with non-dominant L hand to pick up game pieces and wrote legibaly with dominant hand using standard pen without AE. Pt returned to room at end of session, left sitting in w/c with all needs in reach.  Pt educated regarding energy conservation, POC, and d/c planning.   Therapy Documentation Precautions:  Precautions Precautions: Cervical Precaution Comments: [place] J cervical immobilizer Required Braces or Orthoses: Cervical Brace Cervical Brace: Hard collar, At all times Restrictions Weight Bearing Restrictions: No Pain: Pain Assessment Pain Assessment: No/denies pain   See Function Navigator for Current Functional Status.   Therapy/Group: Individual Therapy  Lewis, Cherysh Epperly C 10/02/2014, 7:26 AM

## 2014-10-02 NOTE — Progress Notes (Signed)
Physical Therapy Session Note  Patient Details  Name: Mark Gallagher MRN: [id number] Date of Birth: 08-26-1970  Today's Date: 10/02/2014 PT Individual Time: 1400-1500 PT Individual Time Calculation (min): 60 min   Short Term Goals: Week 2:  PT Short Term Goal 1 (Week 2): = LTGs  Skilled Therapeutic Interventions/Progress Updates:    Pt received in w/c and agreeable to PT.  Pt performed sit<>stand with supervision and amb to therapy gym with RW and supervision demonstrating increased L knee control in stance phase.  PT instructed patient in LLE D1/D2 PNF pattern x2 PROM, 2x10 reps AROM, and 2x10 reps with resistance for each pattern.  Rest breaks as needed between PROM, AROM, and resistance.  PT instructed patient in dynamic standing balance activity focusing on balance during activity with no UE support.  Pt played Wii tennis standing with RW with no UE support x12 minutes.  PT instructed patient in ambulation with RW back towards room with hand off to OT in hallway as pt stated he wanted to use the Wii some more today.    Therapy Documentation Precautions:  Precautions Precautions: Cervical Precaution Comments: [place] J cervical immobilizer Required Braces or Orthoses: Cervical Brace Cervical Brace: Hard collar, At all times Restrictions Weight Bearing Restrictions: No Pain: Pain Assessment Pain Assessment: No/denies pain  See Function Navigator for Current Functional Status.   Therapy/Group: Individual Therapy  Tin Engram E Penven-Crew 10/02/2014, 4:20 PM

## 2014-10-02 NOTE — Progress Notes (Signed)
Occupational Therapy Session Note  Patient Details  Name: Mark Gallagher MRN: [id number] Date of Birth: 06/02/1970  Today's Date: 10/02/2014 OT Individual Time: 0935-1005 OT Individual Time Calculation (min): 30 min    Short Term Goals: Week 1:  OT Short Term Goal 1 (Week 1): Pt will complete UB dressing with set-up OT Short Term Goal 1 - Progress (Week 1): Met OT Short Term Goal 2 (Week 1): Pt will complete toilet transfer with max A OT Short Term Goal 2 - Progress (Week 1): Met OT Short Term Goal 3 (Week 1): Pt will complete toileting task with steadying assist OT Short Term Goal 3 - Progress (Week 1): Met OT Short Term Goal 4 (Week 1): Pt will complete LB dressing with min A. OT Short Term Goal 4 - Progress (Week 1): Met Week 2:  OT Short Term Goal 1 (Week 2): STG=LTG secondary to ELOS  Skilled Therapeutic Interventions/Progress Updates:  Pt found supine in bed. Pt with no complaints of pain. Pt requested that therapist doff bilateral ace wraps and don bilateral TEDs. Encouraged pt to doff ace wraps himself to focus on functional use & strengthening of BUEs/hands, especially left hand. Pt then used bilateral hands to roll ace wraps up. Therapist donned bilateral TEDs. Pt then sat EOB without use of bed rails and HOB down. Pt stood with RW, then ambulated from EOB > sink for grooming task of brushing teeth. Therapist encouraged pt to use cup for rinse/spit after brushing teeth to avoid breaking cervical precautions. After grooming task, pt ambulated > w/c beside bed. Therapist left pt seated in w/c with bilateral leg rests donned, call bell/phone within reach, and went over theraputty exercises for left hand. Pt aware to call for all needs.   Therapy Documentation Precautions:  Precautions Precautions: Cervical Precaution Comments: [place] J cervical immobilizer Required Braces or Orthoses: Cervical Brace Cervical Brace: Hard collar, At all times Restrictions Weight Bearing  Restrictions: No  See Function Navigator for Current Functional Status.  Therapy/Group: Individual Therapy  Cyree Chuong , MS, OTR/L, CLT Pager: [phone]  10/02/2014, 10:30 AM

## 2014-10-02 NOTE — Progress Notes (Signed)
Recreational Therapy Session Note  Patient Details  Name: Mark Gallagher MRN: [id number] Date of Birth: Jul 26, 1970 Today's Date: 10/02/2014  Pain: no c/o Skilled Therapeutic Interventions/Progress Updates: Session focused on activity tolerance, sit-stands, ambulation using RW, fine & gross motor use of BUE's.  Pt ambulated to and from therapy using RW with supervision.  Pt sat to play tabletop board game, [employer] utilizing LUE to [employer] during game play and to write scoring on score card.  Pt states noticeable improvement in his handwriting abilities since last week and is encouraged by progress . Therapy/Group: Co-Treatment   Alcie Runions 10/02/2014, 3:38 PM

## 2014-10-02 NOTE — Progress Notes (Signed)
[facility] PHYSICAL MEDICINE & REHABILITATION     PROGRESS NOTE    Subjective/Complaints: No new issues. Had a quiet day.   ROS: Pt denies fever, rash/itching, headache, blurred or double vision, nausea, vomiting, abdominal pain, diarrhea, chest pain, shortness of breath, palpitations, dysuria, dizziness, neck or back pain, bleeding, anxiety, or depression   Objective: Vital Signs: Blood pressure 105/72, pulse 76, temperature 98.7 F (37.1 C), temperature source Oral, resp. rate 18, height 6\' 8"  (2.032 m), weight 121.292 kg (267 lb 6.4 oz), SpO2 96 %. No results found. No results for input(s): WBC, HGB, HCT, PLT in the last 72 hours. No results for input(s): NA, K, CL, GLUCOSE, BUN, CREATININE, CALCIUM in the last 72 hours.  Invalid input(s): CO CBG (last 3)  No results for input(s): GLUCAP in the last 72 hours.  Wt Readings from Last 3 Encounters:  09/20/14 121.292 kg (267 lb 6.4 oz)  08/28/14 109.997 kg (242 lb 8 oz)  08/23/14 113.399 kg (250 lb)    Physical Exam:  Constitutional: He is oriented to person, place, and time. He appears well-developed and well-nourished. Large frame/head HENT: thrush on tongue Head: Atraumatic.  Eyes: Conjunctivae and EOM are normal. Pupils are equal, round, and reactive to light. Right eye exhibits no discharge.  Neck:  Cervical collar fitting appropriately Cardiovascular: Normal rate and regular rhythm.  No murmur heard. Respiratory: Effort normal and breath sounds normal. No respiratory distress. He has no wheezes. He exhibits no tenderness.  GI: Soft. Bowel sounds are normal. He exhibits no distension. There is no tenderness.  Musculoskeletal: He exhibits improving edema (trace pedal edema bilaterally---none in legs).   no edema left hand   Pain over left pec with palpation is improved Neurological: He is alert and oriented to person, place, and time. RUE: 4+/5 deltoid, bicep, tricep, HI. LUE: 4/5 delt, bicep, tricep, 4-wrist and  HI. Positive left pronator drift. Sensation 1+/2 LUE and LLE. RLE: 4- HF, 4/5 KE and ADF/APF. LLE: 3+HF and KE and 1/5 ADF/APF. Sensation 1++/2 left foot to LT and pain.  Skin: Skin is warm and dry.  Psychiatric: He has a normal mood and affect. His behavior is normal. Judgment and thought content normal.    Assessment/Plan: 1. Functional deficits secondary to C6 SCI  which require 3+ hours per day of interdisciplinary therapy in a comprehensive inpatient rehab setting. Physiatrist is providing close team supervision and 24 hour management of active medical problems listed below. Physiatrist and rehab team continue to assess barriers to discharge/monitor patient progress toward functional and medical goals.  Function:  Bathing Bathing position   Position: Shower  Bathing parts Body parts bathed by patient: Right arm, Left arm, Chest, Abdomen, Front perineal area, Buttocks, Right upper leg, Left upper leg, Right lower leg, Left lower leg Body parts bathed by helper: Back  Bathing assist Assist Level: Touching or steadying assistance(Pt > 75%) Assistive Device Comment: LH Sponge    Upper Body Dressing/Undressing Upper body dressing   What is the patient wearing?: Pull over shirt/dress     Pull over shirt/dress - Perfomed by patient: Thread/unthread right sleeve, Thread/unthread left sleeve, Put head through opening, Pull shirt over trunk Pull over shirt/dress - Perfomed by helper: Put head through opening, Pull shirt over trunk        Upper body assist Assist Level: Set up   Set up : To obtain clothing/put away, To apply TLSO, cervical collar  Lower Body Dressing/Undressing Lower body dressing   What is the  patient wearing?: Underwear, Pants Underwear - Performed by patient: Thread/unthread right underwear leg, Thread/unthread left underwear leg, Pull underwear up/down   Pants- Performed by patient: Thread/unthread right pants leg, Thread/unthread left pants leg, Pull pants  up/down, Fasten/unfasten pants   Non-skid slipper socks- Performed by patient: Don/doff right sock Non-skid slipper socks- Performed by helper: Don/doff right sock, Don/doff left sock   Socks - Performed by helper: Don/doff right sock, Don/doff left sock Shoes - Performed by patient: Don/doff right shoe, Don/doff left shoe, Fasten right Shoes - Performed by helper: Don/doff right shoe, Don/doff left shoe, Fasten right, Fasten left       TED Hose - Performed by helper: Don/doff right TED hose, Don/doff left TED hose  Lower body assist Assist Level: Touching or steadying assistance (Pt > 75%)      Toileting Toileting Toileting activity did not occur: No continent bowel/bladder event Toileting steps completed by patient: Adjust clothing prior to toileting, Performs perineal hygiene, Adjust clothing after toileting Toileting steps completed by helper: Adjust clothing prior to toileting Toileting Assistive Devices: Grab bar or rail  Toileting assist Assist level: More than reasonable time   Transfers Chair/bed transfer   Chair/bed transfer method: Ambulatory Chair/bed transfer assist level: Supervision or verbal cues Chair/bed transfer assistive device: Walker, Air cabin crew     Max distance: 200 ft Assist level: Supervision or verbal cues   Wheelchair   Type: Manual Max wheelchair distance: 200 ft Assist Level: Supervision or verbal cues  Cognition Comprehension Comprehension assist level: Follows complex conversation/direction with no assist  Expression Expression assist level: Expresses complex ideas: With no assist  Social Interaction Social Interaction assist level: Interacts appropriately with others - No medications needed.  Problem Solving Problem solving assist level: Solves complex problems: Recognizes & self-corrects  Memory Memory assist level: More than reasonable amount of time    Medical Problem List and Plan: 1. Functional deficits  secondary to C6 SCI with incomplete myelopathy   2. DVT Prophylaxis/Anticoagulation: Pharmaceutical: Lovenox indicated 3. Pain Management: Will continue oxycodone prn.   -prn het 4. Mood: LCSW to follow for evaluation and support.  5. Neuropsych: This patient is capable of making decisions on his own behalf. 6. Skin/Wound Care: Monitor wound daily. Continue wound care dialy.  7. Fluids/Electrolytes/Nutrition: Monitor I/O. Encourage po. Sodium sl low but otherwise bmet normal 8. ABLA: hgb stable at 9.4. continue fe++ supplement  9. Adrenal insufficiency: On cortef bid for supplement. 10 Hypothyroidism: On synthroid.  11. Hypogonadism: Depotestosterone every 8 weeks--follow up with patient regarding next dose  12. 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 To follow up with cardiology after discharge--currently in normal rhythm with rate control 13. H/o Acromegaly: Treated with Sandostatin? 14. Dysphagia: -advanced to regular diet     15. Edema: TEDS  - ACE wraps and elevation effective---continue for now 16. Thrush: nystatin rinse     LOS (Days) 14 A FACE TO FACE EVALUATION WAS PERFORMED  SWARTZ,ZACHARY T 10/02/2014 7:58 AM

## 2014-10-02 NOTE — Progress Notes (Signed)
Occupational Therapy Session Note  Patient Details  Name: Mark Gallagher MRN: [id number] Date of Birth: [birth year]  Today's Date: 10/02/2014 OT Individual Time: 1500-1533 OT Individual Time Calculation (min): 33 min   Skilled Therapeutic Interventions/Progress Updates:    Pt worked on standing sit to stand in the therapy gym while incorporating LUE functional use and coordination.  Provided low chair with 19 inch chair to work on sit to stand.  Min to mod assist for sit to stand with min guard for standing while using LUE to engage in Wii bowling task. Pt able to isolate digit movement in coordination with arm movement to play game with mod instructional cueing.  Pt perform 4-5 intervals of sit to stand with decreased ability to perform adequate timing with knee and hip extension.  Pt tends to flex trunk forward in combination with knee extension but lacks hip extension until after achieving almost full knee flexion.  Pt ambulated back to the room with supervision using the RW.   Pt's family present at end of session.  (brother and mom)  Call button within reach.   Therapy Documentation Precautions:  Precautions Precautions: Cervical Precaution Comments: [place] J cervical immobilizer Required Braces or Orthoses: Cervical Brace Cervical Brace: Hard collar, At all times Restrictions Weight Bearing Restrictions: No  Pain: Pain Assessment Pain Assessment: No/denies pain ADL: See Function Navigator for Current Functional Status.   Therapy/Group: Individual Therapy  Wynelle Dreier OTR/L 10/02/2014, 5:11 PM

## 2014-10-03 ENCOUNTER — Inpatient Hospital Stay (HOSPITAL_COMMUNITY): Payer: Managed Care, Other (non HMO)

## 2014-10-03 ENCOUNTER — Inpatient Hospital Stay (HOSPITAL_COMMUNITY): Payer: Managed Care, Other (non HMO) | Admitting: Occupational Therapy

## 2014-10-03 ENCOUNTER — Inpatient Hospital Stay (HOSPITAL_COMMUNITY): Payer: Managed Care, Other (non HMO) | Admitting: Physical Therapy

## 2014-10-03 ENCOUNTER — Inpatient Hospital Stay (HOSPITAL_COMMUNITY): Payer: Managed Care, Other (non HMO) | Admitting: *Deleted

## 2014-10-03 NOTE — Progress Notes (Signed)
Physical Therapy Session Note  Patient Details  Name: Mark Gallagher MRN: [id number] Date of Birth: [birth year]  Today's Date: 9/12/16PT Individual Time:1105-1205  PT Individual Time Calculation (min): 60 min   Short Term Goals: Week 2:  PT Short Term Goal 1 (Week 2): = LTGs  Skilled Therapeutic Interventions/Progress Updates:    Patient seen with focus of treatment on gait on grass due to planning to go through grass to enter home from back where he has one step (4 steps no rails at front entrance).  Patient in room in wheelchair sit to stand supervision with UE support.  Gait to therapy gym with RW and left AFO supervision occasional minguard due to left knee buckling x 16'.  Negotiated 8 steps bilat rails supervision.  Patient ambulated to central elevators and down elevators to atrium and outside over brick, sloped concrete with RW and close S, then over grass for about 60' with min assist cues to lift walker each step and place carefully.  Overall about 500-600'.  Patient left sitting in chair in room all needs in reach.  Therapy Documentation Precautions:  Precautions Precautions: Cervical Precaution Comments: [place] J cervical immobilizer Required Braces or Orthoses: Cervical Brace Cervical Brace: Hard collar, At all times Restrictions Weight Bearing Restrictions: No Pain: Pain Assessment Pain Assessment: No/denies pain Pain Score: 2    See Function Navigator for Current Functional Status.   Therapy/Group: Individual Therapy  Mark Gallagher, Mark Gallagher 10/03/2014  10/03/2014, 9:20 AM

## 2014-10-03 NOTE — Progress Notes (Signed)
Occupational Therapy Session Note  Patient Details  Name: Mark Gallagher MRN: [id number] Date of Birth: [birth year]  Today's Date: 10/03/2014 OT Individual Time: 8828-0034 and 1300-1400 OT Individual Time Calculation (min): 60 min and 60 min   Short Term Goals: Week 2:  OT Short Term Goal 1 (Week 2): STG=LTG secondary to ELOS  Skilled Therapeutic Interventions/Progress Updates:    Session One: Pt seen for OT eval and ADL bathing/dressing session. Pt in supine upon arrival, agreeable to tx session. He completed all mobility and transfers with supervision using RW. He gathered clothing items from drawer, demonstrating functional standing balance to bend down to obtain items. He bathed seated on shower chair utilizing LH sponge to assist. Dressing completed on tub transfer bench for shirt and pants. Assist provided for shoes/socks due to time constraints. Total A provided for donning TED hose. Pt left sitting EOB at end of session set-up with breakfast tray, all needs in reach anticipating hand off to PT.  Pt inquiring regarding possibility for return to work, HHOT vs OPOT, and d/c planning. Education provided regarding OT goals, need for safety/ assist, energy conservation, and rate of return/progression of deficits. Will cont to work with pt and other members of medical team regarding d/c planning. Pt currently requires supervision with functional mobility while utilizing RW.  Session Two: Pt seen for OT therapy session focusing on ADL re-training. Pt received in w/c. He completed LB dressing tasks of shoes, socks, and orthosis with emphasis on increased independence with task. Pt able to complete task at overall supervision level with VCs for problem solving and technique to don orthosis. Pt able to cross ankle over knee to access B feet. Education provided regarding sock aid and LH shoe horn, however, pt demonstrated ability to complete task without need for AE. He then ambulated with RW to ADL  kitchen where he completed functional task for removing/ replacing items from cabinet with emphasis on IADL re-training, functional activity tolerance, and UE strengthening/ coordination. Completed task at supervision level. He then completed simulated tub bench transfer with VCs for technique/ sequencing. He then completed toilet transfer to standard toilet of 22" which is what he believes his toilet seat height is at home. He completed transfer with use of grab bars and supervision. Education provided regarding use of 3-1 BSC over toilet to raise toilet seat height and provide arm rests to assist with safe sit<> stand. Pt ambulated back to room at end of session and requested return to bed. Pt left in supine at end of session, all needs in reach.   Therapy Documentation Precautions:  Precautions Precautions: Cervical Precaution Comments: [place] J cervical immobilizer Required Braces or Orthoses: Cervical Brace Cervical Brace: Hard collar, At all times Restrictions Weight Bearing Restrictions: No Pain: Pain Assessment Pain Assessment: 0-10 Pain Score: 2   See Function Navigator for Current Functional Status.   Therapy/Group: Individual Therapy  Lewis, Jontay Maston C 10/03/2014, 7:07 AM

## 2014-10-03 NOTE — Progress Notes (Addendum)
Physical Therapy Session Note  Patient Details  Name: Mark Gallagher MRN: [id number] Date of Birth: 12/31/1970  Today's Date: 10/03/2014 PT Individual Time: 0830-0930  PT Individual Time Calculation (min): 60 min   Short Term Goals: Week 2:  PT Short Term Goal 1 (Week 2): = LTGs  Skilled Therapeutic Interventions/Progress Updates:    Patient seated edge of bed, sit<>stand supervision and ambulated 130' to therapy gym.  Demonstrated floor transfer and patient performed with min assist and cues for technique.  Discussed/educated in fall risk reduction techniques for home and patient verbalized understanding.  Car transfers with min assist from low car position (reports going home in [place]).  Therex for left LE strengthening x 10 reps each with orange theraband, seated LAQ, ankle DF, hamstring curls, sidelying hip abduction (with assist,) SLR, bilateral and unilateral bridging x 10 each.  Patient ambulated back to room and left seated in wheelchair with breakfast re-heated and call bell in reach.  Therapy Documentation Precautions:  Precautions Precautions: Cervical Precaution Comments: [place] J cervical immobilizer Required Braces or Orthoses: Cervical Brace Cervical Brace: Hard collar, At all times Restrictions Weight Bearing Restrictions: No Pain: Pain Assessment Pain Assessment: No/denies pain   See Function Navigator for Current Functional Status.   Therapy/Group: Individual Therapy  McDonald, [place] 10/03/2014  10/03/2014, 12:54 PM

## 2014-10-03 NOTE — Progress Notes (Signed)
Recreational Therapy Session Note  Patient Details  Name: Mark Gallagher MRN: [id number] Date of Birth: Apr 09, 1970 Today's Date: 10/03/2014  Pain: no c/o Skilled Therapeutic Interventions/Progress Updates: Session focused on discharge planning during co-treat with PT.  Initial conversation with pt about purpose of community reintegration with plans to schedule an outing tomorrow.  Pt agreeable.  Further discussion with pt about discharge home revealed that hand rails would not be installed prior to discharge.  Clinicians concerned about home entry based on pt's description.  Home evaluation discussed and pt agreeable for this to be scheduled tomorrow instead of an outing.  SW & OT made aware. Therapy/Group: Co-Treatment  Merie Wulf 10/03/2014, 3:06 PM

## 2014-10-03 NOTE — Progress Notes (Signed)
Physical Therapy Session Note  Patient Details  Name: Mark Gallagher MRN: [id number] Date of Birth: 02-Jan-1971  Today's Date: 10/03/2014 PT Individual Time: 1006-1100 PT Individual Time Calculation (min): 54 min   Short Term Goals: Week 2:  PT Short Term Goal 1 (Week 2): = LTGs  Skilled Therapeutic Interventions/Progress Updates:   Co-treat with recreation therapy.  Pt received in w/c; majority of session consisted of problem solving discussion/education with pt regarding current level of function and progress, goals for remainder of therapy, D/C plan and barriers to D/C, ability to return to work in one month, equipment needs and plan for f/u therapy at D/C due to D/C date being moved up to Thursday due to insurance.  RW has been ordered.  Pt feels he should return to his town home with his parents present to assist with home entry/exit and transportation to outpatient therapy and eventually work.  Pt states he will stay on main level and sleep in the recliner so that he does not have to negotiate full flight of stairs on his own.  Pt did not wish to use a hospital bed.  He also states that his outside entry rails have not been constructed because he was awaiting approval by [facility] and is now Futures trader; he reports he will have walk across the grass into the level back entrance.  He reports his parents have not completed training for this or car transfer training yet.  Will set up outing/home evaluation tomorrow with parents at town home to assess safety with home entry/exit, gait across grass, car transfer and safety in the town home.  Discussed possibility of [place] therapy of staying with his parents if town home is not a safe option currently.  Pt states his parents also do no have a rail on stairs for home entry/exit; pt is also very adamant about starting outpatient therapy as soon as possible to reach functional level to return to work in October; pt very concerned about losing his job.   Discussed with pt realistic expectations and likelihood of being able to stand all day and ambulate to/from parking lot independently following SCI and discussed job concessions to allow pt to work part time.  Pt willing to discuss this with his employer.   Performed gait with pt without AD but with bilat HHA mod A +2 to assess pt reliance on UE for support/trunk control.  Pt still presents with decreased L knee control and increased reliance on UE due to impaired proximal stability.  Also performed one step/curb negotiation training with RW up/down one step x 4 reps and min A overall and min verbal cues for sequencing and safety.  Pt left in w/c in room with all items within reach.           Therapy Documentation Precautions:  Precautions Precautions: Cervical Precaution Comments: [place] J cervical immobilizer Required Braces or Orthoses: Cervical Brace Cervical Brace: Hard collar, At all times Restrictions Weight Bearing Restrictions: No Pain:  No c/o pain  See Function Navigator for Current Functional Status.   Therapy/Group: Individual Therapy  Raylene Everts [facility] 10/03/2014, 12:21 PM

## 2014-10-03 NOTE — Progress Notes (Signed)
PHYSICAL MEDICINE & REHABILITATION     PROGRESS NOTE    Subjective/Complaints: Up with therapy getting into shower. Pleased with progress.   ROS: Pt denies fever, rash/itching, headache, blurred or double vision, nausea, vomiting, abdominal pain, diarrhea, chest pain, shortness of breath, palpitations, dysuria, dizziness, neck or back pain, bleeding, anxiety, or depression   Objective: Vital Signs: Blood pressure 109/74, pulse 74, temperature 97.7 F (36.5 C), temperature source Oral, resp. rate 20, height 6\' 8"  (2.032 m), weight 121.292 kg (267 lb 6.4 oz), SpO2 99 %. No results found. No results for input(s): WBC, HGB, HCT, PLT in the last 72 hours. No results for input(s): NA, K, CL, GLUCOSE, BUN, CREATININE, CALCIUM in the last 72 hours.  Invalid input(s): CO CBG (last 3)  No results for input(s): GLUCAP in the last 72 hours.  Wt Readings from Last 3 Encounters:  09/20/14 121.292 kg (267 lb 6.4 oz)  08/28/14 109.997 kg (242 lb 8 oz)  08/23/14 113.399 kg (250 lb)    Physical Exam:  Constitutional: He is oriented to person, place, and time. He appears well-developed and well-nourished. Large frame/head HENT: thrush on tongue Head: Atraumatic.  Eyes: Conjunctivae and EOM are normal. Pupils are equal, round, and reactive to light. Right eye exhibits no discharge.  Neck:  Cervical collar fitting appropriately. New pads Cardiovascular: Normal rate and regular rhythm.  No murmur heard. Respiratory: Effort normal and breath sounds normal. No respiratory distress. He has no wheezes. He exhibits no tenderness.  GI: Soft. Bowel sounds are normal. He exhibits no distension. There is no tenderness.  Musculoskeletal: He exhibits improving edema (trace pedal edema bilaterally---none in legs).   no edema left hand   Pain over left pec with palpation is improved Neurological: He is alert and oriented to person, place, and time. RUE: 4+/5 deltoid, bicep, tricep, HI.  LUE: 4/5 delt, bicep, tricep, 4wrist and HI. Positive left pronator drift. Sensation 1+/2 LUE and LLE. RLE: 4 HF, 4 to 4+/5 KE and ADF/APF. LLE: 3+HF and KE and 1/5 ADF/APF. Sensation 1++/2 left foot to LT and pain. Improved balance with rolling walker Skin: Skin is warm and dry.  Psychiatric: He has a normal mood and affect. His behavior is normal. Judgment and thought content normal.    Assessment/Plan: 1. Functional deficits secondary to C6 SCI  which require 3+ hours per day of interdisciplinary therapy in a comprehensive inpatient rehab setting. Physiatrist is providing close team supervision and 24 hour management of active medical problems listed below. Physiatrist and rehab team continue to assess barriers to discharge/monitor patient progress toward functional and medical goals.  Function:  Bathing Bathing position   Position: Shower  Bathing parts Body parts bathed by patient: Right arm, Left arm, Chest, Abdomen, Front perineal area, Buttocks, Right upper leg, Left upper leg, Right lower leg, Left lower leg Body parts bathed by helper: Back  Bathing assist Assist Level: Touching or steadying assistance(Pt > 75%) Assistive Device Comment: LH Sponge    Upper Body Dressing/Undressing Upper body dressing   What is the patient wearing?: Pull over shirt/dress     Pull over shirt/dress - Perfomed by patient: Thread/unthread right sleeve, Thread/unthread left sleeve, Put head through opening, Pull shirt over trunk Pull over shirt/dress - Perfomed by helper: Put head through opening, Pull shirt over trunk        Upper body assist Assist Level: Set up   Set up : To obtain clothing/put away, To apply TLSO, cervical collar  Lower  Body Dressing/Undressing Lower body dressing   What is the patient wearing?: Underwear, Pants Underwear - Performed by patient: Thread/unthread right underwear leg, Thread/unthread left underwear leg, Pull underwear up/down   Pants- Performed by patient:  Thread/unthread right pants leg, Thread/unthread left pants leg, Pull pants up/down, Fasten/unfasten pants   Non-skid slipper socks- Performed by patient: Don/doff right sock Non-skid slipper socks- Performed by helper: Don/doff right sock, Don/doff left sock   Socks - Performed by helper: Don/doff right sock, Don/doff left sock Shoes - Performed by patient: Don/doff right shoe, Don/doff left shoe, Fasten right Shoes - Performed by helper: Don/doff right shoe, Don/doff left shoe, Fasten right, Fasten left       TED Hose - Performed by helper: Don/doff right TED hose, Don/doff left TED hose  Lower body assist Assist Level: Touching or steadying assistance (Pt > 75%)      Toileting Toileting Toileting activity did not occur: No continent bowel/bladder event Toileting steps completed by patient: Adjust clothing prior to toileting, Performs perineal hygiene, Adjust clothing after toileting Toileting steps completed by helper: Adjust clothing prior to toileting Toileting Assistive Devices: Grab bar or rail  Toileting assist Assist level: More than reasonable time   Transfers Chair/bed transfer   Chair/bed transfer method: Ambulatory Chair/bed transfer assist level: Supervision or verbal cues Chair/bed transfer assistive device: Walker, Air cabin crew     Max distance: 200 ft Assist level: Supervision or verbal cues   Wheelchair   Type: Manual Max wheelchair distance: 200 ft Assist Level: Supervision or verbal cues  Cognition Comprehension Comprehension assist level: Follows basic conversation/direction with no assist  Expression Expression assist level: Expresses basic needs/ideas: With no assist  Social Interaction Social Interaction assist level: Interacts appropriately with others - No medications needed.  Problem Solving Problem solving assist level: Solves complex 90% of the time/cues < 10% of the time  Memory Memory assist level: Recognizes or recalls  90% of the time/requires cueing < 10% of the time    Medical Problem List and Plan: 1. Functional deficits secondary to C6 SCI with incomplete myelopathy   2. DVT Prophylaxis/Anticoagulation: Pharmaceutical: Lovenox indicated 3. Pain Management: Will continue oxycodone prn.   -prn heat to left shoulder 4. Mood: LCSW to follow for evaluation and support.  5. Neuropsych: This patient is capable of making decisions on his own behalf. 6. Skin/Wound Care: Monitor wound daily. Continue wound care dialy.  7. Fluids/Electrolytes/Nutrition: Monitor I/O. Encourage po. Sodium sl low but otherwise bmet normal 8. ABLA: hgb stable at 9.4. continue fe++ supplement  9. Adrenal insufficiency: On cortef bid for supplement. 10 Hypothyroidism: On synthroid.  11. Hypogonadism: Depotestosterone every 8 weeks--follow up with patient regarding next dose  12. 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 To follow up with cardiology after discharge--currently in normal rhythm with rate control 13. H/o Acromegaly: Treated with Sandostatin? 14. Dysphagia: -advanced to regular diet     15. Edema: TEDS  - ACE wraps and elevation effective---continue for now and PRN after discharge 16. Thrush: nystatin rinse---has improved     LOS (Days) 15 A FACE TO FACE EVALUATION WAS PERFORMED  SWARTZ,ZACHARY T 10/03/2014 8:23 AM

## 2014-10-03 NOTE — Progress Notes (Signed)
Occupational Therapy Discharge Summary  Patient Details  Name: Mark Gallagher MRN: [id number] Date of Birth: 1970/11/20   Patient has met 77 of 14 long term goals due to improved activity tolerance, improved balance, postural control, ability to compensate for deficits, functional use of  RIGHT upper and LEFT upper extremity and improved coordination.  Patient to discharge at overall Modified Independent level.  Patient's care partner is independent to provide the necessary physical assistance at discharge.    Extensive family education including home visit completed. Caregivers and pt voiced feeling comfortable and confident with pt's d/c disposition and overall plan of mod I with supervision for stairs, uneven terrain, and shower transfers.   Recommendation:  Patient will benefit from ongoing skilled OT services in outpatient setting to continue to advance functional skills in the area of BADL, iADL, Vocation and Reduce care partner burden.  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)  Cognition Overall Cognitive Status: Within Functional Limits for tasks assessed Arousal/Alertness: Awake/alert Orientation Level: Oriented X4 Selective Attention: Appears intact Memory: Appears intact Awareness: Appears intact Problem Solving: Appears intact Problem Solving Impairment: Functional basic;Verbal basic Sequencing: Appears  intact Safety/Judgment: Appears intact Sensation Sensation Light Touch: Impaired Detail Light Touch Impaired Details: Impaired RUE;Impaired LUE Coordination Gross Motor Movements are Fluid and Coordinated: No Fine Motor Movements are Fluid and Coordinated: No 9 Hole Peg Test: R: 33 seconds L: 1 min 34 sec Motor  Motor Motor: Tetraplegia;Abnormal postural alignment and control  Trunk/Postural Assessment  Cervical Assessment Cervical Assessment: Exceptions to WFL (C- collar) Thoracic Assessment Thoracic Assessment: Within Functional Limits Lumbar Assessment Lumbar Assessment: Exceptions to [facility] (Posterior Pelvic tilt) Postural Control Postural Control: Deficits on evaluation (Slight posterior lean)  Balance Balance Balance Assessed: Yes Static Sitting Balance Static Sitting - Level of Assistance: 6: Modified independent (Device/Increase time) Dynamic Sitting Balance Dynamic Sitting - Balance Support: Feet supported Dynamic Sitting - Level of Assistance: 6: Modified independent (Device/Increase time) Static Standing Balance Static Standing - Level of Assistance: 6: Modified independent (Device/Increase time) Dynamic Standing Balance Dynamic Standing - Balance Support: During functional activity Dynamic Standing - Level of Assistance: 6: Modified independent (Device/Increase time) Dynamic Standing - Balance Activities: Reaching for objects;Lateral lean/weight shifting;Forward lean/weight shifting Dynamic Standing - Comments: During functional bathing/ dressing tasks.  Extremity/Trunk Assessment RUE Assessment RUE Assessment: Within Functional Limits (Deminished grip strength/ fine motor coordination.) LUE Assessment LUE Assessment: Within Functional Limits LUE AROM (degrees) Overall AROM Left Upper Extremity: Within functional limits for tasks assessed LUE Strength LUE Overall Strength: Within Functional Limits for tasks assessed (Deminished grip strength/ fine motor  coordination. )   See Function Navigator for Current Functional Status.  Lewis, Mark Gallagher C 10/04/2014, 3:30 PM

## 2014-10-04 ENCOUNTER — Inpatient Hospital Stay (HOSPITAL_COMMUNITY): Payer: Managed Care, Other (non HMO) | Admitting: Occupational Therapy

## 2014-10-04 ENCOUNTER — Encounter (HOSPITAL_COMMUNITY): Payer: Managed Care, Other (non HMO) | Admitting: Physical Therapy

## 2014-10-04 ENCOUNTER — Inpatient Hospital Stay (HOSPITAL_COMMUNITY): Payer: Managed Care, Other (non HMO) | Admitting: Physical Therapy

## 2014-10-04 NOTE — Progress Notes (Signed)
Physical Therapy Session Note  Patient Details  Name: RANKIN COOLMAN MRN: [id number] Date of Birth: [birth year]  Today's Date: 10/04/2014 PT Individual Time: 0830-1030 PT Individual Time Calculation (min): 120 min   Short Term Goals: Week 2:  PT Short Term Goal 1 (Week 2): = LTGs  Skilled Therapeutic Interventions/Progress Updates:   Pt participated in home evaluation with focus on hands on training with parents and sister.    Pt and parents performed actual low car transfer x 2 reps with pt performing sit <> stand from low car with min A but able to bring LE into and out of car with extra time to clear head/neck.    Pt and parents performed gait across back yard/uneven grassy terrain and level patio with RW x150' x2 and parents providing min A for balance and therapist providing verbal cues for safe negotiation with RW.  Once on patio pt and parents educated on and return demonstrated one step negotiation from patio <> living room with verbal cues for sequence and parent's providing min A for safety/balance.  Pt demonstrated safe ambulation in the home across carpet and linoleum floor with RW Mod I.  Pt educated on use of lateral stepping with RW to enter/exit 1/2 bath downstairs (due to doorway width) and use of raised toilet seat due to low height of toilet.  Pt also performed transfer into recliner downstairs; pt unable to stand from recliner due to height and soft seating and required mod A to stand; discussed building and placing recliner on 6" platform to allow independent transfers during the day and moving recliner to the side to avoid hitting pt's head on chandelier.  Pt also demonstrated difficulty with rising from kitchen chair; recommending pt utilize bar stool at counter initially or be set up with simple meals during the day in a cooler beside recliner to avoid having to carry/ambulate with RW and food.    Performed stair negotiation training up/down full flight 14 stairs with bilat  rails initially changing to L rail only and then L rail + rail posts at top of stairs.  Pt performed stair negotiation with mother providing min A with pt beginning with forwards to ascend and descend and then switching to lateral negotiation for 3-4 steps when only one rail present on L; pt and family required verbal cues for safe step to sequence.  Also discussed sequence of having pt's mother put RW at top and bottom of stairs prior to beginning negotiation.  At top of stairs pt demonstrated safe sit <> stand from bed (23" high) and lateral stepping into bathroom with RW.  In bathroom discussed placement of tub bench and raised toilet seat and placement of grab bars to allow pt to have UE support without use of RW due to small space.    Recommending pt have two raise toilet seats for each bathroom and will ask OT to measure height of tub bench to determine specific height of grab bar placement in shower.    Discussed barriers and recommendations with pt on trip to return to hospital and option of staying at his parents if unable to manage safely initially.  Pt verbalized understanding and states parents front entrance does have one rail.       Therapy Documentation Precautions:  Precautions Precautions: Cervical Precaution Comments: [place] J cervical immobilizer Required Braces or Orthoses: Cervical Brace Cervical Brace: Hard collar, At all times Restrictions Weight Bearing Restrictions: No Pain: Pain Assessment Pain Assessment: 0-10 Pain Score: 0-No  pain (premedicated)  See Function Navigator for Current Functional Status.   Therapy/Group: Home evaluation  Raylene Everts [facility] 10/04/2014, 11:22 AM

## 2014-10-04 NOTE — Progress Notes (Signed)
Recreational Therapy Session Note  Patient Details  Name: Mark Gallagher MRN: [id number] Date of Birth: [birth year] Today's Date: 10/04/2014  Pain: no c/o Skilled Therapeutic Interventions/Progress Updates: Pt participated in home eval with focus on family education.  Education included home entry-walking over grassy surface using RW, car transfers, step/thresh hold negotiation, going up and down 14 stairs, functional mobility throughout his home& problem solving through meal planning.  All appreciated of home eval.  See PT not for further details.  Therapy/Group: Co-Treatment   Ioana Louks 10/04/2014, 12:12 PM

## 2014-10-04 NOTE — Progress Notes (Signed)
Occupational Therapy Session Note  Patient Details  Name: Mark Gallagher MRN: [id number] Date of Birth: 1970-03-18  Today's Date: 10/04/2014 OT Individual Time: 1100-1203 and 1400-1430 OT Individual Time Calculation (min): 63 min and 30 min   Short Term Goals: Week 2:  OT Short Term Goal 1 (Week 2): STG=LTG secondary to ELOS  Skilled Therapeutic Interventions/Progress Updates:    Session One: Pt seen for OT ADL bathing and dressing session with emphasis on family training. Pt in w/c upon arrival, voicing fatigue from home visit, however, agreeable to tx session. Pt ambulated throughout room using RW. He gathered clothing items from low drawer. Pt verbalized feeling slightly dizzy when bending over, however, subsided with standing rest break. Education provided regarding keeping commonly used items at easy to reach level and squating with knees vs bending over. He bathed seated on tub bench mod I using LH sponge. He returned to supine in bed following shower. Caregivers provided education and demonstration regarding changing of cervical brace pads and care. Pt returned to w/c at end of session, left sitting in w/c with all needs in reach and caregivers present.   Session Two:  Pt seen for OT session focusing on family training and functional transfers/ DME training. Pt sitting up in w/c upon arrival with caregivers present, agreeable to tx session requesting need for toileting task. Pt ambulated throughout room and completed standing toileting task using RW with supervision- mod I. He ambulated to ADL apartment where pt and family educated regarding set-up of tub transfer bench. Discussion/ education regarding placement of grab bars in bathroom with recommendations made for safest transfer methods.  Pt then completed 9 hole peg test, see results below. Pt and caregivers very pleased with progress made on peg board score and with gains in independence made during rehab stay. He returned to room at  end of session, left sitting up in w/c with all needs in reach.   9 Hole Peg Test:  L: 1 min 34 sec R: 33 sec  Therapy Documentation Precautions:  Precautions Precautions: Cervical Precaution Comments: [place] J cervical immobilizer Required Braces or Orthoses: Cervical Brace Cervical Brace: Hard collar, At all times Restrictions Weight Bearing Restrictions: No Pain: Pain Assessment Pain Assessment: No/denies pain Pain Score: 0-No pain  See Function Navigator for Current Functional Status.   Therapy/Group: Individual Therapy  Lewis, Keiarah Orlowski C 10/04/2014, 7:17 AM

## 2014-10-04 NOTE — Progress Notes (Signed)
Physical Therapy Discharge Summary  Patient Details  Name: Mark Gallagher MRN: [id number] Date of Birth: Dec 13, 1970  Today's Date: 10/04/2014 PT Individual Time: 1300-1330 PT Individual Time Calculation (min): 30 min    Patient has met 8 of 8 long term goals due to improved activity tolerance, improved balance, improved postural control, increased strength, decreased pain, ability to compensate for deficits, functional use of  right upper extremity, right lower extremity, left upper extremity and left lower extremity, improved attention, improved awareness and improved coordination.  Patient to discharge at an ambulatory level Modified Independent for household ambulation but min A for gait outside over uneven terrain, for car transfers and stair negotiation.   Patient's care partner is independent to provide the necessary physical assistance at discharge.  Reasons goals not met: All goals met  Recommendation:  Patient will benefit from ongoing skilled PT services in outpatient setting to continue to advance safe functional mobility, address ongoing impairments in UE, LE and core weakness, impaired coordination, impaired postural control, balance, gait, and minimize fall risk.  Equipment: RW  Reasons for discharge: treatment goals met and discharge from hospital  Patient/family agrees with progress made and goals achieved: Yes  PT Discharge Pt received in room with family present.  Pt and family engaged in continued education and hands on training for home.  Performed bed mobility training on flat bed with pt performing transfer to/from bed and sit <>supine and rolling L and R mod I.  Reviewed stair negotiation training again on full flight of stairs with mother providing min A and pt performing with improved sequencing.  Also performed one step/larger curb training with RW x 2 reps with father performing hands on min A.  Pt returned to room and left in w/c to rest before OT session; no  further questions or concerns from family.  Pain Pain Assessment Pain Assessment: 0-10 Pain Score: Asleep Pain Type: Acute pain Pain Location: Neck Pain Descriptors / Indicators: Aching Pain Frequency: Intermittent Pain Onset: With Activity Patients Stated Pain Goal: 0 Pain Intervention(s): Medication (See eMAR) Sensation Sensation Light Touch: Impaired by gross assessment Light Touch Impaired Details: Impaired RLE;Impaired LLE Proprioception: Impaired by gross assessment Coordination Gross Motor Movements are Fluid and Coordinated: No Motor  Motor Motor: Tetraplegia;Abnormal postural alignment and control  Mobility Bed Mobility Bed Mobility: Rolling Right;Rolling Left;Supine to Sit;Sit to Supine Rolling Right: 6: Modified independent (Device/Increase time) Rolling Left: 6: Modified independent (Device/Increase time) Supine to Sit: 6: Modified independent (Device/Increase time) Sit to Supine: 6: Modified independent (Device/Increase time) Locomotion  Gait Gait Pattern: Impaired Gait Pattern: Step-through pattern;Decreased step length - left;Decreased dorsiflexion - left;Left flexed knee in stance;Trunk flexed Stairs / Additional Locomotion Curb: 4: Min assist  Trunk/Postural Assessment  Cervical Assessment Cervical Assessment: Exceptions to [facility] (in c-collar) Thoracic Assessment Thoracic Assessment: Within Functional Limits Lumbar Assessment Lumbar Assessment: Exceptions to [facility] (posterior tilt) Postural Control Postural Control: Deficits on evaluation (impaired balance reactions)  Balance Static Sitting Balance Static Sitting - Level of Assistance: 7: Independent Dynamic Sitting Balance Dynamic Sitting - Level of Assistance: 6: Modified independent (Device/Increase time) Static Standing Balance Static Standing - Balance Support: Right upper extremity supported;Left upper extremity supported Static Standing - Level of Assistance: 6: Modified independent  (Device/Increase time) Dynamic Standing Balance Dynamic Standing - Balance Support: Right upper extremity supported;Left upper extremity supported Dynamic Standing - Level of Assistance: 5: Stand by assistance Extremity Assessment  RLE Assessment RLE Assessment: Within Functional Limits (poor functional endurance and motor planning) LLE  Strength LLE Overall Strength Comments: Strength improved to 4-5/5 but still with poor functional endurance and motor planning   See Function Navigator for Current Functional Status.  Raylene Everts [place] 10/04/2014, 8:57 PM

## 2014-10-04 NOTE — Progress Notes (Signed)
Recreational Therapy Discharge Summary Patient Details  Name: JEREMYAH JELLEY MRN: [id number] Date of Birth: 1970-11-10 Today's Date: 10/04/2014  Comments on progress toward goals: Pt has made great progress during LOS and is ready for discharge home tomorrow with intermittent assistance from family.  Pt is Mod I for simple TR tasks and is anxious to return to previously enjoyed leisure activities and community pursuits. Reasons for discharge: discharge from hospital   Patient/family agrees with progress made and goals achieved: Yes  Vaughn Beaumier 10/04/2014, 12:18 PM

## 2014-10-04 NOTE — Discharge Summary (Signed)
Physician Discharge Summary  Patient ID: Mark Gallagher MRN: [id number] DOB/AGE: Jul 25, 1970 44 y.o.  Admit date: 09/18/2014 Discharge date: 10/05/2014  Discharge Diagnoses:  Principal Problem:   Cervical spondylosis with myelopathy Active Problems:   Acromegaly   Tetraplegia   Acute blood loss anemia   Adjustment disorder with depressed mood   Discharged Condition:  Stable.   Significant Diagnostic Studies: No results found.  Labs:  Basic Metabolic Panel: BMP Latest Ref Rng 09/19/2014 08/23/2014 09/08/2013  Glucose 65 - 99 mg/dL 105(H) 92 117(H)  BUN 6 - 20 mg/dL 5(L) 27(H) 21  Creatinine 0.61 - 1.24 mg/dL 0.58(L) 0.80 0.64  Sodium 135 - 145 mmol/L 133(L) 137 141  Potassium 3.5 - 5.1 mmol/L 3.7 4.1 3.8  Chloride 101 - 111 mmol/L 98(L) 100(L) 104  CO2 22 - 32 mmol/L 29 - 29  Calcium 8.9 - 10.3 mg/dL 8.4(L) - 9.5     CBC: CBC Latest Ref Rng 09/25/2014 09/22/2014 09/21/2014  WBC 4.0 - 10.5 K/uL 4.1 4.7 4.7  Hemoglobin 13.0 - 17.0 g/dL 9.4(L) 9.1(L) 9.3(L)  Hematocrit 39.0 - 52.0 % 28.8(L) 28.1(L) 28.4(L)  Platelets 150 - 400 K/uL 304 271 251     CBG: No results for input(s): GLUCAP in the last 168 hours.  Brief HPI:   This is a 44 year old male with history of acromegaly, s/p pituitary adenoma resection, s/p cervical vertebrectomy C4/5 with C3-6 plating 2007 who started developing progressive pain LUE with tingling left arm, weakness BLE with left foot drop and multiple falls. MRI spine revealed cervical canal stenosis C6/7 with T2 signal in adjacent spinal cord. He was admitted to [facility] on 09/09/14 for C2-T3 decompression and fusion by Dr. Tommi Rumps. Post op treated with one unit PRBC for ABLA. On 08/23, he developed SVT requiring cardioversion and spontaneously converted to NSR. He had developed recurrent SVT with hypotension and underwent cardiac ablation on 08/27 with resolution of arrhthymias. He has had some improvement in LUE as well as BLE strength post op. Therapy ongoing  and CIR recommended by MD and rehab team. Notes were evaluated by Rehab CM as well as Rehab MD and patient was deemed to be a good rehab candidate. He was cleared medically for admission today.     Hospital Course: Mark Gallagher was admitted to rehab 09/18/2014 for inpatient therapies to consist of PT, ST and OT at least three hours five days a week. Past admission physiatrist, therapy team and rehab RN have worked together to provide customized collaborative inpatient rehab. Neck incision has healed well without signs or symptoms of infection.  Pain in neck and left shoulder has improve and is currently managed on ultram alone.  He was started on senna bid to help manage constipation.  Blood pressure and heart rate were monitored on bid basis and have been controlled without recurrent tachycardia.  Follow up labs reveal mild hyponatremia. Serial CBC shows improvement in ABLA. Po intake has been good and he is continent of bowel and bladder. He is tolerating regular diet without difficulty. He has made steady progress and is independent in supervised setting. He will continue to receive follow up Outpatient PT and OT at [facility] after discharge.    Rehab course: During patient's stay in rehab weekly team conferences were held to monitor patient's progress, set goals and discuss barriers to discharge. At admission, patient required moderate assist with ADL tasks and max assist with mobility. He has had improvement in activity tolerance, balance, postural control,  as well as ability to compensate for deficits. Speech therapy has educated patient on swallow strategies as well as diet recommendations and he is able to follow this independently  He is modified independent for transfers and ambulation in home setting. He requires min assist for ambulating out of home, on uneven terrain, for stair navigation and car transfers.  He is able to complete bathing and upper body dressing tasks with set up  assist and use of AD. He requires min assist to don/ doff AFO and TEDs.  Family education as well as home evaluation was done prior to discharge.     Disposition: 01-Home or Self Care  Diet: Regular  Special Instructions: 1. Intermittent supervision. 2. No driving.  3. No alcohol.      Medication List    STOP taking these medications        methylPREDNISolone 4 MG Tbpk tablet  Commonly known as:  MEDROL DOSEPAK     oxyCODONE 5 MG immediate release tablet  Commonly known as:  Oxy IR/ROXICODONE     testosterone cypionate 200 MG/ML injection  Commonly known as:  DEPOTESTOSTERONE CYPIONATE      TAKE these medications        acetaminophen 325 MG tablet  Commonly known as:  TYLENOL  Take 1-2 tablets (325-650 mg total) by mouth every 4 (four) hours as needed for mild pain.     diclofenac 50 MG EC tablet  Commonly known as:  VOLTAREN  Take by mouth.     famotidine 20 MG tablet  Commonly known as:  PEPCID  Take 1 tablet (20 mg total) by mouth 2 (two) times daily. For reflux     ferrous sulfate 325 (65 FE) MG tablet  Take 1 tablet (325 mg total) by mouth 2 (two) times daily with a meal.     fluticasone 50 MCG/ACT nasal spray  Commonly known as:  FLONASE  Place 2 sprays into both nostrils daily.     hydrocortisone 20 MG tablet  Commonly known as:  CORTEF  Take 20 mg in am and 10 mg in evenings     levothyroxine 200 MCG tablet  Commonly known as:  SYNTHROID, LEVOTHROID  Take 1 tablet (200 mcg total) by mouth daily.     MUSCLE RUB 10-15 % Crea  Apply 1 application topically as needed for muscle pain.     octreotide 30 MG injection  Commonly known as:  SANDOSTATIN LAR DEPOT  Inject 30 mg into the muscle every 28 (twenty-eight) days.     senna-docusate 8.6-50 MG per tablet  Commonly known as:  Senokot-S  Take 2 tablets by mouth 2 (two) times daily. For constipation--adjust as needed     traMADol 50 MG tablet  Commonly known as:  ULTRAM  Take 1 tablet (50 mg  total) by mouth every 6 (six) hours as needed for moderate pain.       Follow-up Information    Follow up with Meredith Staggers, MD On 10/30/2014.   Specialty:  Physical Medicine and Rehabilitation   Why:  Be there at 1:20 for 1:40 pm appointment   Contact information:   [address], [place] [address] [phone]       Follow up with Leandrew Koyanagi, MD On 10/16/2014.   Specialties:  Internal Medicine, Adolescent Medicine   Why:  @ 11:15 AM with Dr. Carlota Raspberry - If you need to be seen sooner, he will be at the walk-in clinic on 10-09-14 from 2pm-8:30pm  Contact information:   [place] [id number] [phone]       Signed: Bary Leriche 10/09/2014, 2:19 PM

## 2014-10-04 NOTE — Progress Notes (Signed)
[facility]ICINE & RE[facility]ABILITATION     PROGRESS NOTE    Subjective/Complaints: Up with therapy getting into shower. Pleased with progress.   ROS: Pt denies fever, rash/itching, headache, blurred or double vision, nausea, vomiting, abdominal pain, diarrhea, chest pain, shortness of breath, palpitations, dysuria, dizziness, neck or back pain, bleeding, anxiety, or depression   Objective: Vital Signs: Blood pressure 105/66, pulse 65, temperature 98.5 F (36.9 C), temperature source Oral, resp. rate 18, height 6\' 8"  (2.032 m), weight 121.292 kg (267 lb 6.4 oz), SpO2 98 %. No results found. No results for input(s): WBC, HGB, HCT, PLT in the last 72 hours. No results for input(s): NA, K, CL, GLUCOSE, BUN, CREATININE, CALCIUM in the last 72 hours.  Invalid input(s): CO CBG (last 3)  No results for input(s): GLUCAP in the last 72 hours.  Wt Readings from Last 3 Encounters:  09/20/14 121.292 kg (267 lb 6.4 oz)  08/28/14 109.997 kg (242 lb 8 oz)  08/23/14 113.399 kg (250 lb)    Physical Exam:  Constitutional: He is oriented to person, place, and time. He appears well-developed and well-nourished. Large frame/head HENT: thrush on tongue Head: Atraumatic.  Eyes: Conjunctivae and EOM are normal. Pupils are equal, round, and reactive to light. Right eye exhibits no discharge.  Neck:  Cervical collar fitting appropriately. New pads Cardiovascular: Normal rate and regular rhythm.  No murmur heard. Respiratory: Effort normal and breath sounds normal. No respiratory distress. He has no wheezes. He exhibits no tenderness.  GI: Soft. Bowel sounds are normal. He exhibits no distension. There is no tenderness.  Musculoskeletal: He exhibits improving edema (trace pedal edema bilaterally---none in legs).   no edema left hand   Pain over left pec with palpation is improved Neurological: He is alert and oriented to person, place, and time. RUE: 4+/5 deltoid, bicep, tricep, HI.  LUE: 4/5 delt, bicep, tricep, 4wrist and HI. Positive left pronator drift. Sensation 1+/2 LUE and LLE. RLE: 4 HF, 4 to 4+/5 KE and ADF/APF. LLE: 3+HF and KE and 1/5 ADF/APF. Sensation 1++/2 left foot to LT and pain. Improved balance with rolling walker Skin: Skin is warm and dry.  Psychiatric: He has a normal mood and affect. His behavior is normal. Judgment and thought content normal.    Assessment/Plan: 1. Functional deficits secondary to C6 SCI  which require 3+ hours per day of interdisciplinary therapy in a comprehensive inpatient rehab setting. Physiatrist is providing close team supervision and 24 hour management of active medical problems listed below. Physiatrist and rehab team continue to assess barriers to discharge/monitor patient progress toward functional and medical goals.  Function:  Bathing Bathing position   Position: Shower  Bathing parts Body parts bathed by patient: Right arm, Left arm, Chest, Abdomen, Front perineal area, Buttocks, Right upper leg, Left upper leg, Right lower leg, Left lower leg, Back Body parts bathed by helper: Back  Bathing assist Assist Level: More than reasonable time Assistive Device Comment: LH Sponge    Upper Body Dressing/Undressing Upper body dressing   What is the patient wearing?: Pull over shirt/dress     Pull over shirt/dress - Perfomed by patient: Thread/unthread right sleeve, Thread/unthread left sleeve, Put head through opening, Pull shirt over trunk Pull over shirt/dress - Perfomed by helper: Put head through opening, Pull shirt over trunk        Upper body assist Assist Level: Set up   Set up : To apply TLSO, cervical collar  Lower Body Dressing/Undressing Lower body dressing  What is the patient wearing?: Underwear, Pants, Ted Hose, AFO, Shoes Underwear - Performed by patient: Thread/unthread right underwear leg, Thread/unthread left underwear leg, Pull underwear up/down   Pants- Performed by patient: Thread/unthread  right pants leg, Thread/unthread left pants leg, Pull pants up/down, Fasten/unfasten pants   Non-skid slipper socks- Performed by patient: Don/doff right sock Non-skid slipper socks- Performed by helper: Don/doff right sock, Don/doff left sock   Socks - Performed by helper: Don/doff right sock, Don/doff left sock Shoes - Performed by patient: Don/doff right shoe, Don/doff left shoe, Fasten right Shoes - Performed by helper: Don/doff right shoe, Don/doff left shoe, Fasten right, Fasten left   AFO - Performed by helper: Don/doff left AFO   TED Hose - Performed by helper: Don/doff right TED hose, Don/doff left TED hose  Lower body assist Assist Level: Touching or steadying assistance (Pt > 75%)      Toileting Toileting Toileting activity did not occur: No continent bowel/bladder event Toileting steps completed by patient: Adjust clothing prior to toileting, Performs perineal hygiene, Adjust clothing after toileting Toileting steps completed by helper: Adjust clothing prior to toileting Toileting Assistive Devices: Grab bar or rail  Toileting assist Assist level: More than reasonable time   Transfers Chair/bed transfer   Chair/bed transfer method: Ambulatory Chair/bed transfer assist level: Supervision or verbal cues Chair/bed transfer assistive device: Medical sales representative     Max distance: 160 Assist level: Supervision or verbal cues   Wheelchair   Type: Manual Max wheelchair distance: 200 ft Assist Level: Supervision or verbal cues  Cognition Comprehension Comprehension assist level: Follows basic conversation/direction with no assist  Expression Expression assist level: Expresses basic needs/ideas: With no assist  Social Interaction Social Interaction assist level: Interacts appropriately with others with medication or extra time (anti-anxiety, antidepressant).  Problem Solving Problem solving assist level: Solves basic 75 - 89% of the time/requires cueing 10 -  24% of the time  Memory Memory assist level: Recognizes or recalls 90% of the time/requires cueing < 10% of the time    Medical Problem List and Plan: 1. Functional deficits secondary to C6 SCI with incomplete myelopathy   -home eval today. Dc tomorrow  -had discussion about realistic goals, return to work. 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 DVT Prophylaxis/Anticoagulation: Pharmaceutical: Lovenox indicated 3. Pain Management: Will continue oxycodone prn.   -prn heat to left shoulder 4. Mood: LCSW to follow for evaluation and support.  5. Neuropsych: This patient is capable of making decisions on his own behalf. 6. Skin/Wound Care: Monitor wound daily. Continue wound care dialy.  7. Fluids/Electrolytes/Nutrition: Monitor I/O. Encourage po. Sodium sl low but otherwise bmet normal 8. ABLA: hgb stable at 9.4. continue fe++ supplement. Recheck tomorrow before dc  9. Adrenal insufficiency: On cortef bid for supplement. 10 Hypothyroidism: On synthroid.  11. Hypogonadism: Depotestosterone every 8 weeks--follow up with patient regarding next dose  12. 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 To follow up with cardiology after discharge--currently in normal rhythm with rate control 13. H/o Acromegaly: Treated with Sandostatin? 14. Dysphagia: -advanced to regular diet     15. Edema: TEDS  - ACE wraps and elevation effective---continue for now and PRN after discharge 16. Thrush: nystatin rinse---has improved     LOS (Days) 16 A FACE TO FACE EVALUATION WAS PERFORMED  Anayla Giannetti T 10/04/2014 8:14 AM

## 2014-10-05 ENCOUNTER — Ambulatory Visit (INDEPENDENT_AMBULATORY_CARE_PROVIDER_SITE_OTHER): Payer: Managed Care, Other (non HMO) | Admitting: *Deleted

## 2014-10-05 DIAGNOSIS — E220 Acromegaly and pituitary gigantism: Secondary | ICD-10-CM

## 2014-10-05 LAB — CBC
HCT: 34.8 % — ABNORMAL LOW (ref 39.0–52.0)
Hemoglobin: 10.9 g/dL — ABNORMAL LOW (ref 13.0–17.0)
MCH: 30.4 pg (ref 26.0–34.0)
MCHC: 31.3 g/dL (ref 30.0–36.0)
MCV: 96.9 fL (ref 78.0–100.0)
Platelets: 249 10*3/uL (ref 150–400)
RBC: 3.59 MIL/uL — ABNORMAL LOW (ref 4.22–5.81)
RDW: 14.1 % (ref 11.5–15.5)
WBC: 3.6 10*3/uL — ABNORMAL LOW (ref 4.0–10.5)

## 2014-10-05 MED ORDER — FERROUS SULFATE 325 (65 FE) MG PO TABS: 325.0000 mg | ORAL_TABLET | Freq: Two times a day (BID) | ORAL | Status: AC

## 2014-10-05 MED ORDER — HYDROCORTISONE 20 MG PO TABS: ORAL_TABLET | ORAL | Status: DC

## 2014-10-05 MED ORDER — SENNOSIDES-DOCUSATE SODIUM 8.6-50 MG PO TABS: 2.0000 | ORAL_TABLET | Freq: Two times a day (BID) | ORAL | Status: DC

## 2014-10-05 MED ORDER — MUSCLE RUB 10-15 % EX CREA: 1.0000 "application " | TOPICAL_CREAM | CUTANEOUS | Status: DC | PRN

## 2014-10-05 MED ORDER — LEVOTHYROXINE SODIUM 200 MCG PO TABS: 100.0000 ug | ORAL_TABLET | Freq: Every day | ORAL | Status: DC

## 2014-10-05 MED ORDER — OCTREOTIDE ACETATE 30 MG IM KIT
30.0000 mg | PACK | Freq: Once | INTRAMUSCULAR | Status: AC
  Administered 2014-10-05: 30 mg via INTRAMUSCULAR

## 2014-10-05 MED ORDER — FLUTICASONE PROPIONATE 50 MCG/ACT NA SUSP: 2.0000 | Freq: Every day | NASAL | Status: DC

## 2014-10-05 MED ORDER — TRAMADOL HCL 50 MG PO TABS: 50.0000 mg | ORAL_TABLET | Freq: Four times a day (QID) | ORAL | Status: DC | PRN

## 2014-10-05 MED ORDER — ACETAMINOPHEN 325 MG PO TABS: 325.0000 mg | ORAL_TABLET | ORAL | Status: DC | PRN

## 2014-10-05 MED ORDER — FAMOTIDINE 20 MG PO TABS: 20.0000 mg | ORAL_TABLET | Freq: Two times a day (BID) | ORAL | Status: DC

## 2014-10-05 MED ORDER — LEVOTHYROXINE SODIUM 200 MCG PO TABS: 200.0000 ug | ORAL_TABLET | Freq: Every day | ORAL | Status: AC

## 2014-10-05 NOTE — Progress Notes (Signed)
[place] PHYSICAL MEDICINE & REHABILITATION     PROGRESS NOTE    Subjective/Complaints: Home eval went fairly well. Stairs will be a bit of a challenge but doable.    ROS: Pt denies fever, rash/itching, headache, blurred or double vision, nausea, vomiting, abdominal pain, diarrhea, chest pain, shortness of breath, palpitations, dysuria, dizziness, neck or back pain, bleeding, anxiety, or depression   Objective: Vital Signs: Blood pressure 118/82, pulse 72, temperature 97.7 F (36.5 C), temperature source Oral, resp. rate 18, height 6\' 8"  (2.032 m), weight 121.292 kg (267 lb 6.4 oz), SpO2 97 %. No results found. No results for input(s): WBC, HGB, HCT, PLT in the last 72 hours. No results for input(s): NA, K, CL, GLUCOSE, BUN, CREATININE, CALCIUM in the last 72 hours.  Invalid input(s): CO CBG (last 3)  No results for input(s): GLUCAP in the last 72 hours.  Wt Readings from Last 3 Encounters:  09/20/14 121.292 kg (267 lb 6.4 oz)  08/28/14 109.997 kg (242 lb 8 oz)  08/23/14 113.399 kg (250 lb)    Physical Exam:  Constitutional: He is oriented to person, place, and time. He appears well-developed and well-nourished. Large frame/head HENT: thrush on tongue Head: Atraumatic.  Eyes: Conjunctivae and EOM are normal. Pupils are equal, round, and reactive to light. Right eye exhibits no discharge.  Neck:  Cervical collar fitting appropriately.  Cardiovascular: Normal rate and regular rhythm.  No murmur heard. Respiratory: Effort normal and breath sounds normal. No respiratory distress. He has no wheezes. He exhibits no tenderness.  GI: Soft. Bowel sounds are normal. He exhibits no distension. There is no tenderness.  Musculoskeletal: He exhibits improving edema (trace pedal edema bilaterally---none in legs).   no edema left hand   Pain over left pec with palpation is improved Neurological: He is alert and oriented to person, place, and time. RUE: 4+/5 deltoid, bicep,  tricep, HI. LUE: 4/5 delt, bicep, tricep, 4wrist and HI. Positive left pronator drift. Sensation 1+/2 LUE and LLE. RLE: 4 HF, 4 to 4+/5 KE and ADF/APF. LLE: 3+HF and KE and 1/5 ADF/APF. Sensation 1++/2 left foot to LT and pain. Improved balance with rolling walker Skin: Skin is warm and dry.  Psychiatric: He has a normal mood and affect. His behavior is normal. Judgment and thought content normal.    Assessment/Plan: 1. Functional deficits secondary to C6 SCI  which require 3+ hours per day of interdisciplinary therapy in a comprehensive inpatient rehab setting. Physiatrist is providing close team supervision and 24 hour management of active medical problems listed below. Physiatrist and rehab team continue to assess barriers to discharge/monitor patient progress toward functional and medical goals.  Function:  Bathing Bathing position   Position: Shower  Bathing parts Body parts bathed by patient: Right arm, Left arm, Chest, Abdomen, Front perineal area, Buttocks, Right upper leg, Left upper leg, Right lower leg, Left lower leg, Back Body parts bathed by helper: Back  Bathing assist Assist Level: More than reasonable time Assistive Device Comment: LH Sponge    Upper Body Dressing/Undressing Upper body dressing   What is the patient wearing?: Pull over shirt/dress     Pull over shirt/dress - Perfomed by patient: Thread/unthread right sleeve, Thread/unthread left sleeve, Put head through opening, Pull shirt over trunk Pull over shirt/dress - Perfomed by helper: Put head through opening, Pull shirt over trunk        Upper body assist Assist Level: Set up   Set up : To apply TLSO, cervical collar  Lower Body Dressing/Undressing Lower body dressing   What is the patient wearing?: Underwear, Pants, Ted Hose, AFO, Shoes Underwear - Performed by patient: Thread/unthread right underwear leg, Thread/unthread left underwear leg, Pull underwear up/down   Pants- Performed by patient:  Thread/unthread right pants leg, Thread/unthread left pants leg, Pull pants up/down   Non-skid slipper socks- Performed by patient: Don/doff right sock, Don/doff left sock Non-skid slipper socks- Performed by helper: Don/doff right sock, Don/doff left sock   Socks - Performed by helper: Don/doff right sock, Don/doff left sock Shoes - Performed by patient: Don/doff right shoe, Don/doff left shoe, Fasten right, Fasten left Shoes - Performed by helper: Don/doff right shoe, Don/doff left shoe, Fasten right, Fasten left   AFO - Performed by helper: Don/doff left AFO   TED Hose - Performed by helper: Don/doff right TED hose, Don/doff left TED hose  Lower body assist Assist Level: Touching or steadying assistance (Pt > 75%)      Toileting Toileting Toileting activity did not occur: No continent bowel/bladder event Toileting steps completed by patient: Adjust clothing prior to toileting, Performs perineal hygiene, Adjust clothing after toileting Toileting steps completed by helper: Adjust clothing prior to toileting Toileting Assistive Devices:  (Standing at [employer])  Toileting assist Assist level: More than reasonable time   Transfers Chair/bed transfer   Chair/bed transfer method: Ambulatory Chair/bed transfer assist level: No Help, no cues, assistive device, takes more than a reasonable amount of time Chair/bed transfer assistive device: Environmental consultant, Orthosis     Locomotion Ambulation     Max distance: 150 Assist level: Supervision or verbal cues   Wheelchair Wheelchair activity did not occur: N/A Type: Manual Max wheelchair distance: 200 ft Assist Level: Supervision or verbal cues  Cognition Comprehension Comprehension assist level: Follows basic conversation/direction with no assist  Expression Expression assist level: Expresses basic needs/ideas: With no assist  Social Interaction Social Interaction assist level: Interacts appropriately with others with medication or extra time  (anti-anxiety, antidepressant).  Problem Solving Problem solving assist level: Solves basic 75 - 89% of the time/requires cueing 10 - 24% of the time  Memory Memory assist level: Recognizes or recalls 90% of the time/requires cueing < 10% of the time    Medical Problem List and Plan: 1. Functional deficits secondary to C6 SCI with incomplete myelopathy   -home today  -follow up with me in 4 weeks. 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 DVT Prophylaxis/Anticoagulation: Pharmaceutical: Lovenox indicated 3. Pain Management: Will continue oxycodone prn.   -prn heat to left shoulder 4. Mood: LCSW to follow for evaluation and support.  5. Neuropsych: This patient is capable of making decisions on his own behalf. 6. Skin/Wound Care: Monitor wound daily. Continue wound care dialy.  7. Fluids/Electrolytes/Nutrition: Monitor I/O. Encourage po. Sodium sl low but otherwise bmet normal 8. ABLA: hgb stable at 9.4. continue fe++ supplement. Today's cbc pending  9. Adrenal insufficiency: On cortef bid for supplement. 10 Hypothyroidism: On synthroid.  11. Hypogonadism: Depotestosterone every 8 weeks--follow up with patient regarding next dose  12. 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 To follow up with cardiology after discharge--currently in normal rhythm with rate control 13. H/o Acromegaly: Treated with Sandostatin? 14. Dysphagia: -advanced to regular diet     15. Edema: TEDS  - ACE wraps and elevation effective---  PRN once home 16. Thrush: nystatin rinse---has improved     LOS (Days) 17 A FACE TO FACE EVALUATION WAS PERFORMED  Seeley Hissong T 10/05/2014 7:55 AM    [facility]IL[facility]     PROGRESS NOTE    Subjective/Complaints: Up with therapy getting into shower. Pleased with progress.   ROS: Pt denies fever, rash/itching, headache, blurred or double vision, nausea, vomiting, abdominal pain, diarrhea, chest pain, shortness of breath, palpitations, dysuria,  dizziness, neck or back pain, bleeding, anxiety, or depression   Objective: Vital Signs: Blood pressure 118/82, pulse 72, temperature 97.7 F (36.5 C), temperature source Oral, resp. rate 18, height 6\' 8"  (2.032 m), weight 121.292 kg (267 lb 6.4 oz), SpO2 97 %. No results found. No results for input(s): WBC, HGB, HCT, PLT in the last 72 hours. No results for input(s): NA, K, CL, GLUCOSE, BUN, CREATININE, CALCIUM in the last 72 hours.  Invalid input(s): CO CBG (last 3)  No results for input(s): GLUCAP in the last 72 hours.  Wt Readings from Last 3 Encounters:  09/20/14 121.292 kg (267 lb 6.4 oz)  08/28/14 109.997 kg (242 lb 8 oz)  08/23/14 113.399 kg (250 lb)    Physical Exam:  Constitutional: He is oriented to person, place, and time. He appears well-developed and well-nourished. Large frame/head HENT: thrush on tongue Head: Atraumatic.  Eyes: Conjunctivae and EOM are normal. Pupils are equal, round, and reactive to light. Right eye exhibits no discharge.  Neck:  Cervical collar fitting appropriately. New pads Cardiovascular: Normal rate and regular rhythm.  No murmur heard. Respiratory: Effort normal and breath sounds normal. No respiratory distress. He has no wheezes. He exhibits no tenderness.  GI: Soft. Bowel sounds are normal. He exhibits no distension. There is no tenderness.  Musculoskeletal: He exhibits improving edema (trace pedal edema bilaterally---none in legs).   no edema left hand   Pain over left pec with palpation is improved Neurological: He is alert and oriented to person, place, and time. RUE: 4+/5 deltoid, bicep, tricep, HI. LUE: 4/5 delt, bicep, tricep, 4wrist and HI. Positive left pronator drift. Sensation 1+/2 LUE and LLE. RLE: 4 HF, 4 to 4+/5 KE and ADF/APF. LLE: 3+HF and KE and 1/5 ADF/APF. Sensation 1++/2 left foot to LT and pain. Improved balance with rolling walker Skin: Skin is warm and dry.  Psychiatric: He has a normal mood and affect. His  behavior is normal. Judgment and thought content normal.    Assessment/Plan: 1. Functional deficits secondary to C6 SCI  which require 3+ hours per day of interdisciplinary therapy in a comprehensive inpatient rehab setting. Physiatrist is providing close team supervision and 24 hour management of active medical problems listed below. Physiatrist and rehab team continue to assess barriers to discharge/monitor patient progress toward functional and medical goals.  Function:  Bathing Bathing position   Position: Shower  Bathing parts Body parts bathed by patient: Right arm, Left arm, Chest, Abdomen, Front perineal area, Buttocks, Right upper leg, Left upper leg, Right lower leg, Left lower leg, Back Body parts bathed by helper: Back  Bathing assist Assist Level: More than reasonable time Assistive Device Comment: LH Sponge    Upper Body Dressing/Undressing Upper body dressing   What is the patient wearing?: Pull over shirt/dress     Pull over shirt/dress - Perfomed by patient: Thread/unthread right sleeve, Thread/unthread left sleeve, Put head through opening, Pull shirt over trunk Pull over shirt/dress - Perfomed by helper: Put head through opening, Pull shirt over trunk        Upper body assist Assist Level: Set up   Set up : To apply TLSO, cervical collar  Lower Body Dressing/Undressing Lower body dressing  What is the patient wearing?: Underwear, Pants, Ted Hose, AFO, Shoes Underwear - Performed by patient: Thread/unthread right underwear leg, Thread/unthread left underwear leg, Pull underwear up/down   Pants- Performed by patient: Thread/unthread right pants leg, Thread/unthread left pants leg, Pull pants up/down   Non-skid slipper socks- Performed by patient: Don/doff right sock, Don/doff left sock Non-skid slipper socks- Performed by helper: Don/doff right sock, Don/doff left sock   Socks - Performed by helper: Don/doff right sock, Don/doff left sock Shoes - Performed  by patient: Don/doff right shoe, Don/doff left shoe, Fasten right, Fasten left Shoes - Performed by helper: Don/doff right shoe, Don/doff left shoe, Fasten right, Fasten left   AFO - Performed by helper: Don/doff left AFO   TED Hose - Performed by helper: Don/doff right TED hose, Don/doff left TED hose  Lower body assist Assist Level: Touching or steadying assistance (Pt > 75%)      Toileting Toileting Toileting activity did not occur: No continent bowel/bladder event Toileting steps completed by patient: Adjust clothing prior to toileting, Performs perineal hygiene, Adjust clothing after toileting Toileting steps completed by helper: Adjust clothing prior to toileting Toileting Assistive Devices:  (Standing at [employer])  Toileting assist Assist level: More than reasonable time   Transfers Chair/bed transfer   Chair/bed transfer method: Ambulatory Chair/bed transfer assist level: No Help, no cues, assistive device, takes more than a reasonable amount of time Chair/bed transfer assistive device: Environmental consultant, Orthosis     Locomotion Ambulation     Max distance: 150 Assist level: Supervision or verbal cues   Wheelchair Wheelchair activity did not occur: N/A Type: Manual Max wheelchair distance: 200 ft Assist Level: Supervision or verbal cues  Cognition Comprehension Comprehension assist level: Follows basic conversation/direction with no assist  Expression Expression assist level: Expresses basic needs/ideas: With no assist  Social Interaction Social Interaction assist level: Interacts appropriately with others with medication or extra time (anti-anxiety, antidepressant).  Problem Solving Problem solving assist level: Solves basic 75 - 89% of the time/requires cueing 10 - 24% of the time  Memory Memory assist level: Recognizes or recalls 90% of the time/requires cueing < 10% of the time    Medical Problem List and Plan: 1. Functional deficits secondary to C6 SCI with incomplete  myelopathy   -home eval today. Dc tomorrow  -had discussion about realistic goals, return to work. 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 DVT Prophylaxis/Anticoagulation: Pharmaceutical: Lovenox indicated 3. Pain Management: Will continue oxycodone prn.   -prn heat to left shoulder 4. Mood: LCSW to follow for evaluation and support.  5. Neuropsych: This patient is capable of making decisions on his own behalf. 6. Skin/Wound Care: Monitor wound daily. Continue wound care dialy.  7. Fluids/Electrolytes/Nutrition: Monitor I/O. Encourage po. Sodium sl low but otherwise bmet normal 8. ABLA: hgb stable at 9.4. continue fe++ supplement. Recheck tomorrow before dc  9. Adrenal insufficiency: On cortef bid for supplement. 10 Hypothyroidism: On synthroid.  11. Hypogonadism: Depotestosterone every 8 weeks--follow up with patient regarding next dose  12. 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 To follow up with cardiology after discharge--currently in normal rhythm with rate control 13. H/o Acromegaly: Treated with Sandostatin? 14. Dysphagia: -advanced to regular diet     15. Edema: TEDS  - ACE wraps and elevation effective---continue for now and PRN after discharge 16. Thrush: nystatin rinse---has improved     LOS (Days) 17 A FACE TO FACE EVALUATION WAS PERFORMED  Alyshia Kernan T 10/05/2014 7:55 AM

## 2014-10-05 NOTE — Progress Notes (Signed)
Social Work Patient ID: Mark Gallagher, male   DOB: 02/11/1970, 44 y.o.   MRN: [id number]   CSW spoke with pt and his family (sister and parents) multiple times after team conference to update them on discussion and d/c plans.  CSW worked with them to arrange outpt rehab, DME, handicap placard application, etc.  Pt requested a letter from MD to support his case to expedite the installation of handrails at townhome, as the family was told it would be 3 weeks.  CSW aided pt in receiving letter and faxed and mailed it to company hired to install rails.  Pt and family were pleased with care.  Pt received BSC, but questioned if pt could physically use it.  Sister stated they would go home and try it and problem solve once they get home.  CSW remains available as needed, however pt is set for d/c today.

## 2014-10-05 NOTE — Patient Care Conference (Signed)
Inpatient RehabilitationTeam Conference and Plan of Care Update Date: 10/03/2014   Time: 2:05 PM    Patient Name: Mark Gallagher      Medical Record Number: [id number]  Date of Birth: 1970-02-12 Sex: Male         Room/Bed: 4M08C/4M08C-01 Payor Info: Payor: A[employer] / Plan: [employer] MANAGED / Product Type: *No Product type* /    Admitting Diagnosis: acromegaly with cervical fusion and cardiac ablation  Admit Date/Time:  09/18/2014  2:07 PM Admission Comments: No comment available   Primary Diagnosis:  Cervical spondylosis with myelopathy Principal Problem: Cervical spondylosis with myelopathy  Patient Active Problem List   Diagnosis Date Noted  . Adjustment disorder with depressed mood   . Tetraplegia 09/19/2014  . Acute blood loss anemia 09/19/2014  . Cervical spondylosis with myelopathy 09/18/2014  . Radiculopathy of arm 08/28/2014  . Hypogonadism male 09/08/2013  . 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 Cardiomyopathy 04/01/2011  . Wolff-Parkinson-White (WPW) syndrome 04/01/2011    Expected Discharge Date: Expected Discharge Date: 10/05/14  Team Members Present: Physician leading conference: Dr. Alger Simons Social Worker Present: Alfonse Alpers, LCSW Nurse Present: Heather Roberts, RN PT Present: Raylene Everts, PT OT Present: Napoleon Form, OT SLP Present: Weston Anna, SLP PPS Coordinator present : Ileana Ladd, PT     Current Status/Progress Goal Weekly Team Focus  Medical   continued progress. edema improved  improve stamina, safety  safety, pain   Bowel/Bladder   Continent to bowel and bladder.  To continue continent to B & B with Mod. I.  Assess Bowel and Bladder function Q shift.   Swallow/Nutrition/ Hydration             ADL's   Supervision overall   Mod I overall  functional transfers, family training, LB dressing, IADLs, [place]   Mobility   Supervision-min A for bed mobility, transfers, gait with RW and stairs  Mod I transfers and home gait; min A car transfer and stairs; d/c w/c  mobility  Home eval to assess parent's ability to assist across grass; education and D/C preparation   Communication             Safety/Cognition/ Behavioral Observations            Pain   Soreness on neck and shoulders.Using 2 Norco Q 4 hrs. PRN.  pain level less than 3 on scale 1 to 10  Assess pain levels Q 2-3 hrs and PRN   Skin   Incion on the neck OTA.  Remain free from breakdown or infection with mod. assisst.  Assess skin Q shift and PRN.    Rehab Goals Patient on target to meet rehab goals: Yes Rehab Goals Revised: none *See Care Plan and progress notes for long and short-term goals.  Barriers to Discharge: safety issues at home    Possible Resolutions to Barriers:  home eval    Discharge Planning/Teaching Needs:  Pt and family have decided that it would be better for pt/family to provide pt with 24/7 supervision in his own home.  His sister and parents will be his caregivers.  Pt's parents and sister will meet pt and therapist at pt's townhome for home eval/outing/family education.   Team Discussion:  Pt is doing well medically minor pain issues.  His swelling continues to improve.  PT to do home eval/outing/family education at pt's home on 10-04-14.  Pt plans to only climb the stairs in his home for bed and come down in the morning. OT continues to work  on home plans, as well.  Revisions to Treatment Plan:  none   Continued Need for Acute Rehabilitation Level of Care: The patient requires daily medical management by a physician with specialized training in physical medicine and rehabilitation for the following conditions: Daily direction of a multidisciplinary physical rehabilitation program to ensure safe treatment while eliciting the highest outcome that is of practical value to the patient.: Yes Daily analysis of laboratory values and/or radiology reports with any subsequent need for medication adjustment of medical intervention for : Post surgical problems;Pulmonary  problems  Menucha Dicesare, Silvestre Mesi 10/05/2014, 2:09 PM

## 2014-10-05 NOTE — Discharge Instructions (Signed)
Inpatient Rehab Discharge Instructions  Mark Gallagher Discharge date and time:  10/05/14  Activities/Precautions/ Functional Status: Activity: No lifting, driving, or strenuous exercise till cleared by MD. Wear collar at all times. Needs supervision if out of home setting.  Diet: regular diet Wound Care: keep wound clean and dry   Functional status:  ___ No restrictions     ___ Walk up steps independently ___ 24/7 supervision/assistance   ___ Walk up steps with assistance _X__ Intermittent supervision/assistance  ___ Bathe/dress independently ___ Walk with walker     ___ Bathe/dress with assistance ___ Walk Independently    ___ Shower independently ___ Walk with assistance    _X__ Shower with assistance _X__ No alcohol     ___ Return to work/school ________    COMMUNITY REFERRALS UPON DISCHARGE:   Outpatient: PT     OT  Agency:  [facility]   Phone:  [phone]  Appointment Date/Time:  10-09-14 at 2:00 PM (OT) and 2:45 PM (PT)  Arrive 20 minutes early Medical Equipment/Items Ordered:  Wide rolling walker with double wheels; bedside commode; tub transfer bench  Agency/Supplier:  [facility]                   Phone:  [phone]   Special Instructions:     My questions have been answered and I understand these instructions. I will adhere to these goals and the provided educational materials after my discharge from the hospital.  Patient/Caregiver Signature _______________________________ Date __________  Clinician Signature _______________________________________ Date __________  Please bring this form and your medication list with you to all your follow-up doctor's appointments.

## 2014-10-05 NOTE — Progress Notes (Signed)
Social Work Discharge Note  The overall goal for the admission was met for:   Discharge location: Yes - pt's home  Length of Stay: Yes - 17 days  Discharge activity level: Yes - supervision with mod I for some tasks  Home/community participation: Yes  Services provided included: MD, RD, PT, OT, SLP, RN, TR, Pharmacy, Neuropsych and SW  Financial Services: Private Insurance: [employer]  Follow-up services arranged: Outpatient: PT/OT, DME: wide rolling walker with double wheels; tub transfer bench; bedside commode and Patient/Family request agency HH: Pt wants to receive OT and PT in same facility, therefore he chose [facility],, DME: [facility] (per insurance, also)  Comments (or additional information):  Pt went to his home with his parents and sister to assist him there.  They will also get him to outpt appointments and therapy.  Pt was pleased with rehab and is looking forward to going home.  Patient/Family verbalized understanding of follow-up arrangements: Yes  Individual responsible for coordination of the follow-up plan: pt with his parents and sister for support  Confirmed correct DME delivered: Trey Sailors 10/05/2014    Sincere Berlanga, Silvestre Mesi

## 2014-10-05 NOTE — Progress Notes (Signed)
   Subjective:    Patient ID: Mark Gallagher, male    DOB: 03/13/1970, 44 y.o.   MRN: [id number]  HPI    Review of Systems     Objective:   Physical Exam        Assessment & Plan:  Patient is here for Sandostatin [place] 30 mg injection only.

## 2014-10-05 NOTE — Progress Notes (Signed)
Patient discharged home.  Patient left floor ambulatory with walker, escorted by nursing staff and family.  Patient verbalized understanding of discharge instructions as given by Algis Liming, PA.  All patient belongings sent with patient.  Appears to be in no immediate distress at this time.  Brita Romp, RN

## 2014-10-09 ENCOUNTER — Ambulatory Visit: Payer: Managed Care, Other (non HMO) | Admitting: Occupational Therapy

## 2014-10-09 ENCOUNTER — Encounter: Payer: Self-pay | Admitting: Occupational Therapy

## 2014-10-09 ENCOUNTER — Ambulatory Visit: Payer: Managed Care, Other (non HMO) | Attending: Physical Medicine & Rehabilitation | Admitting: Physical Therapy

## 2014-10-09 ENCOUNTER — Encounter: Payer: Self-pay | Admitting: Physical Therapy

## 2014-10-09 DIAGNOSIS — R531 Weakness: Secondary | ICD-10-CM | POA: Diagnosis present

## 2014-10-09 DIAGNOSIS — M6289 Other specified disorders of muscle: Secondary | ICD-10-CM | POA: Insufficient documentation

## 2014-10-09 DIAGNOSIS — G8250 Quadriplegia, unspecified: Secondary | ICD-10-CM | POA: Diagnosis present

## 2014-10-09 DIAGNOSIS — M6281 Muscle weakness (generalized): Secondary | ICD-10-CM | POA: Diagnosis present

## 2014-10-09 DIAGNOSIS — R278 Other lack of coordination: Secondary | ICD-10-CM

## 2014-10-09 DIAGNOSIS — R279 Unspecified lack of coordination: Secondary | ICD-10-CM | POA: Insufficient documentation

## 2014-10-09 DIAGNOSIS — M21372 Foot drop, left foot: Secondary | ICD-10-CM | POA: Insufficient documentation

## 2014-10-09 DIAGNOSIS — R29898 Other symptoms and signs involving the musculoskeletal system: Secondary | ICD-10-CM

## 2014-10-09 DIAGNOSIS — R269 Unspecified abnormalities of gait and mobility: Secondary | ICD-10-CM

## 2014-10-09 NOTE — Therapy (Signed)
[place] [address] [place], [place], [address] Phone: [phone]   Fax:  [phone]  Physical Therapy Evaluation  Patient Details  Name: Mark Gallagher MRN: [id number] Date of Birth: 1970/05/12 Referring Provider:  Charlett Blake, MD  Encounter Date: 10/09/2014      PT End of Session - 10/09/14 2057    Visit Number 1   Number of Visits 21  eval + 20 visits   Authorization Type Aetna   PT Start Time 1451   PT Stop Time 1537   PT Time Calculation (min) 46 min   Equipment Utilized During Treatment Cervical collar   Activity Tolerance Patient tolerated treatment well   Behavior During Therapy [facility] for tasks assessed/performed      Past Medical History  Diagnosis Date  . Thyroid disease   . Cancer   . Acromegaly   . Testosterone deficiency   . Knee pain, bilateral   . History of pituitary tumor     Past Surgical History  Procedure Laterality Date  . Cervical disc surgery  2007    Anterior C4/5 vertebrectomy and c3-C6 laminoplasty with plating  . Craniotomy for tumor      pituitary adenoma    There were no vitals filed for this visit.  Visit Diagnosis:  Abnormality of gait - Plan: PT plan of care cert/re-cert  Muscle weakness (generalized) - Plan: PT plan of care cert/re-cert  Foot drop, left - Plan: PT plan of care cert/re-cert  Tetraparesis - Plan: PT plan of care cert/re-cert      Subjective Assessment - 10/09/14 1458    Subjective Prior to surgery, pt was living a normal life, working FT, and walking without assistance/assistive device. On 8/23, pt required cardioversion due to SVT, then required cardiac ablation on 8/27 due to recurrent SVT and hypotension. [facility] inpatient [facility]hab unit from 8/29 - 10/04/14. Discharged home, where parents are currently staying with pt.Currently, pt utilizes RW for all mobility and reports LLE instability. Pt would like to return to work at [employer], which would require 6.5 - 7 hours of standing/walking per day. Pt's bedroom is on second floor of home; therefore, pt must negotiate 14 stairs to access room. Pt does so twice per day with assist of both parents.   Pertinent History Cervical precautions. PMH: cervical spondylosis with myelopathy; s/p cervical vertebrectomy C4/5 with C3-6 plating 2007 acromegaly s/p pituitary tumor resection (1994); hypogonadism; Wolff-Parkinson-White Syndrome; cardiomyopathy; acute blood loss anemia; adjustment disorder with depressed mood   Patient Stated Goals "I'd like to be able to walk by myself with no assistance, stand up out of a chair by myself, be able to get up and down stairs without assistance.   Currently in Pain? Yes   Pain Score 3    Pain Location Neck   Pain Orientation Mid   Pain Descriptors / Indicators Sharp   Pain Type Acute pain;Surgical pain   Pain Onset 1 to 4 weeks ago   Pain Frequency Intermittent   Aggravating Factors  lean over to eat   Pain Relieving Factors pain meds   Multiple Pain Sites No            [facility] PT Assessment - 10/09/14 1503    Assessment   Onset Date/Surgical Date 09/10/14   Hand Dominance Left   Precautions   Precautions Cervical  no bending, no lifting >5lbs, driving, collar all the time   Required Braces or Orthoses Cervical Brace;Other  Brace/Splint   Other Brace/Splint L AFO (Blue rocker)   Balance Screen   Has the patient fallen in the past 6 months Yes   How many times? 3   Has the patient had a decrease in activity level because of a fear of falling?  Yes   Is the patient reluctant to leave their home because of a fear of falling?  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   ROM / Strength   AROM / PROM / Strength Strength   Strength   Overall Strength Deficits   Overall Strength Comments RLE grossly WF. L hip flexion/knee extension 4/5; L ankle PF 2+/5; DF 0/5.   Transfers   Transfers Sit to Stand;Stand to [employer] Transfers   Sit to Stand 5: Supervision   Sit to Stand Details (indicate cue type and reason) with RW; requires UE use   Stand to Sit 5: Supervision;4: Min guard   Stand to Sit Details with RW; min guard for safe positioning in front of standard chair due to inability to rotate nec   Ambulation/Gait   Ambulation/Gait Yes   Ambulation/Gait Assistance 5: Supervision;4: Min guard   Ambulation Distance (Feet) 275 Feet   Assistive device Rolling walker  bariatric RW; L AFO   Gait Pattern Step-through pattern;Decreased hip/knee flexion - left;Decreased weight shift to left;Left flexed knee in stance   Ambulation Surface Level;Indoor   Stairs Yes   Stairs Assistance 1: +2 Total assist;4: Min assist   Stairs Assistance Details (indicate cue type and reason) additional therapist providing SBA for safety only   Stair Management Technique Two rails;Step to pattern;Forwards   Number of Stairs 4   Height of Stairs 6                            PT Education - 10/09/14 2055    Education provided Yes   Education Details PT eval findings, goals, and POC.   Person(s) Educated Patient;Parent(s);Other (comment)  sister   Methods Explanation   Comprehension Verbalized understanding          PT Short Term Goals - 10/09/14 2113    PT SHORT TERM GOAL #1   Title Pt will perform initial HEP with mod I using paper handout to indicate safe HEP compliance, maximize functional gains made in PT. Target date: 11/06/14   PT SHORT TERM GOAL #2   Title Perform 6  Minute Walk Test to assess/address functional endurance. Target date: 11/06/14   PT SHORT TERM GOAL #3   Title Perform TUG to assess/address efficiency of functional mobility. Target date: 11/06/14   PT SHORT TERM GOAL #4   Title Pt will ambulate 200' over level, indoor surfaces with mod I using LRAD to indicate increased safety/indepedence with household ambulation. Target date: 11/06/14   PT SHORT TERM GOAL #5   Title Pt will negotiate 14 stairs with B rails and min A of single person to indicate increased independence accessing second floor of home. Target date: 11/06/14           PT Long Term Goals - 10/09/14 2125    PT LONG TERM GOAL #1   Title Pt will improve 6 Minute Walk distance by 150' from baseline to indicate significant improvement in functional endurance. Target date: 12/04/14   PT LONG TERM GOAL #2   Title Pt will improve TUG score by 10.8 seconds from baseline to indicate significant improvement in efficiency of functional mobility.  Target date: 12/04/14   PT LONG TERM GOAL #3   Title Pt will ambulate 500' over unlevel, paved surfaces with mod I using LRAD to indicate increased stability/independence with community mobility. Target date: 12/04/14   PT LONG TERM GOAL #4   Title Pt will negotiate standard ramp and curb step with mod I using LRAD to indicate increased safety/indepedence traversing community obstacles.  Target date: 12/04/14   PT LONG TERM GOAL #5   Title Pt will negotiate 14 stairs with 2 rails and mod I to indicate increased independence accessing second floor of home.  Target date: 12/04/14               Plan - 10/09/14 2140    Clinical Impression Statement Pt is a 44 y/o M referred to outpatient PT to address functional impairments associated with myelopathy s/p C3-T2 spinal fusion surgery (09/10/14). Pt hospitalized 8/20 - 10/04/14 (including inpatient rehab from 8/29 - 10/04/14). Prior to surgery, pt was completely independent with all functional mobility without AD and working FT. PT evaluation reveals the following impairments: generalized weakness (most prominent in LLE); gait impairments, including L foot drop; impaired sensation and proprioception in LLE; decreased coordination on L > R LE; impaired standing balance; decreased stability/independence with all aspects of functional mobility. Pt will benefit from skilled outpatient PT 3x/week for 4 weeks followed by 2x/week for subsequent 4 weeks to address said impairments.    Pt will benefit from skilled therapeutic intervention in order to improve on the following deficits Abnormal gait;Decreased activity tolerance;Decreased balance;Decreased endurance;Decreased coordination;Decreased mobility;Decreased strength;Impaired sensation;Pain;Other (comment)  pain will be monitored but not directly address in PT due to nature of pain (surgical)   Rehab Potential Good   PT Frequency Other (comment)  3x/week for 4 weeks followed by 2x/week for subsequent 4 weeks    PT Duration Other (comment)  See above.   PT Treatment/Interventions Vestibular;ADLs/Self Care Home Management;DME Instruction;Balance training;Therapeutic exercise;Therapeutic activities;Functional mobility training;Stair training;Gait training;Neuromuscular re-education;Patient/family education   PT Next Visit Plan Perform TUG and 6MWT. Address L knee control. Initiate HEP.    Consulted and Agree with Plan of Care Patient;Family member/caregiver   Family Member Consulted mother, father, and sister         Problem List Patient Active Problem List   Diagnosis Date Noted  . Adjustment disorder with depressed mood   . Tetraplegia 09/19/2014  . Acute blood loss anemia 09/19/2014  . Cervical spondylosis with myelopathy  09/18/2014  . Radiculopathy of arm 08/28/2014  . Hypogonadism male 09/08/2013  . 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 Cardiomyopathy 04/01/2011  . Wolff-Parkinson-White (WPW) syndrome 04/01/2011    Billie Ruddy, PT, DPT [facility] [address] [place], [place], [address] Phone: [phone]   Fax:  [phone] 10/09/2014, 9:54 PM

## 2014-10-09 NOTE — Therapy (Signed)
[place] [address] [place], [place], [address] Phone: [phone]   Fax:  [phone]  Occupational Therapy Evaluation  Patient Details  Name: Mark Gallagher MRN: [id number] Date of Birth: 1970/01/23 Referring Provider:  Meredith Staggers, MD  Encounter Date: 10/09/2014      OT End of Session - 10/09/14 1542    Visit Number 1   Number of Visits 17   Date for OT Re-Evaluation 12/08/14   Authorization Type Aetna--not yet verified   OT Start Time 4193   OT Stop Time 1450   OT Time Calculation (min) 45 min   Activity Tolerance Patient tolerated treatment well   Behavior During Therapy [facility] for tasks assessed/performed      Past Medical History  Diagnosis Date  . Thyroid disease   . Cancer   . Acromegaly   . Testosterone deficiency   . Knee pain, bilateral   . History of pituitary tumor     Past Surgical History  Procedure Laterality Date  . Cervical disc surgery  2007    Anterior C4/5 vertebrectomy and c3-C6 laminoplasty with plating  . Craniotomy for tumor      pituitary adenoma    There were no vitals filed for this visit.  Visit Diagnosis:  Weakness generalized  Left hand weakness  Decreased coordination      Subjective Assessment - 10/09/14 1410    Subjective  Pt/family reports increasing difficulty walking with fast progression and 3 falls before surgery   Patient is accompained by: Family member  parents, sister   Pertinent History C3-T2 fusion 09/10/14 with cardiac surgery after surgery, hx of previous C1-C6 fusion (2007)   Patient Stated Goals return to work   Currently in Pain? Yes   Pain Score 3    Pain Location Neck   Pain Descriptors / Indicators Sharp   Pain Type Surgical pain;Acute pain   Aggravating Factors  lean over to eat   Pain Relieving Factors pain meds   Effect of Pain on Daily Activities OT will monitor, but will not directly address due to location/nature            [facility] OT Assessment - 10/09/14 0001    Assessment   Diagnosis cervical stenosis, mylelopathy s/p Cervical fusion, C3-T2 fusion   Onset Date 09/02/14  surgery   Precautions   Precautions Cervical  no bending, no lifting >5lbs, driving, collar all the time   Required Braces or Orthoses Cervical Brace   Balance Screen   Has the patient fallen in the past 6 months Yes   How many times? 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   Vocation Requirements standing 6-7.5 hrs, typing, writing   Leisure spending time with family, swim, walk, be outdoord  ADL   Eating/Feeding Set up   Grooming Shaving  assist due to cervical collar, performing lying down   Upper Body Bathing Minimal assistance   Lower Body Bathing Minimal assistance   Upper Body Dressing Minimal assistance  for cervical collar   Lower Body Dressing Moderate assistance   Toilet Tranfer Modified independent   Toileting - Clothing Manipulation Modified independent   Tub/Shower Transfer Minimal assistance   Tub/Shower Transfer Equipment Grab bars   IADL   Prior Level of Function Shopping independent   Shopping --  not performing   Prior Level of Function Light Housekeeping independent   Light Housekeeping --  not  performing   Prior Level of Function Meal Prep independent   Meal Prep --  not performing   Prior Level of Function Community Mobility drove'   Community Mobility Relies on family or friends for transportation   Mobility   Mobility Status History of falls  with supervision and RW, 2 person assist for stairs   Written Expression   Dominant Hand Left   Handwriting Increased time;100% legible   Vision - History   Additional Comments not assessed, pt denies difficulty reading   Cognition   Overall Cognitive Status --  WFL for eval   Sensation   Light Touch Impaired by gross assessment   Additional Comments tingling in L hand   Coordination   9 Hole Peg Test Right;Left   Right 9 Hole Peg Test 39.53   Left 9 Hole Peg Test 77.34   ROM / Strength   AROM / PROM / Strength AROM   AROM   Overall AROM  Within functional limits for tasks performed  did not test overhead movements due to cervical precautions   Strength   Overall Strength Due to precautions  proximal UE strength   Hand Function   Right Hand Grip (lbs) 76   Left Hand Grip (lbs) 30                         OT Education - 10/09/14 1504    Education provided Yes   Education Details OT plan of care, review of precautions, recommended pt/family call insurance company regarding benefits, recommended crossing legs to get pants/underwear started on feet and then standing to pull up, recommended pt/family call Dr. Naaman Plummer regarding how long he needs to wear compression stockings   Person(s) Educated Patient;Parent(s);Other (comment)  sister   Methods Explanation   Comprehension Verbalized understanding          OT Short Term Goals - 10/09/14 1556    OT SHORT TERM GOAL #1   Title Pt will verbalize understanding of coordination/hand strength HEP--check STGs 11/08/14   Time 4   Period Weeks   Status New   OT SHORT TERM GOAL #2   Title Pt will perfom BADLs mod I.   Time 4   Period Weeks   Status New    OT SHORT TERM GOAL #3   Title Pt will improve L hand coordination for ADLs as shown by completing 9-hole peg test in less than 55sec.   Baseline 77.34sec   Time 4   Period Weeks   Status New   OT SHORT TERM GOAL #4   Title Pt will demo at least 40lbs L grip strength for ADLs/opening containers.   Baseline 30lbs   Time 4   Period Weeks   Status New           OT Long  Term Goals - 10/09/14 1558    OT LONG TERM GOAL #1   Title Pt will be independent with proximal UE HEP when cleared from cervical/lifting precautions.--check LTGs 12/09/14   Time 8   Period Weeks   Status New   OT LONG TERM GOAL #2   Title Pt will perform simple meal prep/home maintenance tasks mod I.   Time 8   Period Weeks   Status New   OT LONG TERM GOAL #3   Title Pt will improve L hand coordination for ADLs as shown by completing 9-hole peg test in less than 45sec.   Baseline 77.34sec   Time 8   Period Weeks   Status New   OT LONG TERM GOAL #4   Title Pt will demo at least 55lbs L grip strength for ADLs/opening containers.   Baseline 30lbs   Time 8   Period Weeks   Status New               Plan - 10/09/14 1547    Clinical Impression Statement Pt is a 44 y.o. male with hx of cervical stenosis and previous C1-C6 fusion (2007).  Pt s/p C3-T2 fusion 09/10/14 after progressive weakness and falls.  Pt presents today with decreased strength (including dominant LUE weakness), decreased coordination, decreased balance for ADLs/IADLs and cervical precautions limiting ADL/IADL performance.  Pt would benefit from occupational therapy for coordination, strengthening, and ADL/IADL performance including balance for ADLs.     Pt will benefit from skilled therapeutic intervention in order to improve on the following deficits (Retired) Decreased activity tolerance;Decreased balance;Decreased mobility;Decreased strength;Pain;Impaired UE functional use;Decreased knowledge of use of DME;Decreased coordination   Rehab  Potential Good   OT Frequency 2x / week   OT Duration 8 weeks  +eval   OT Treatment/Interventions Passive range of motion;Moist Heat;Cryotherapy;DME and/or AE instruction;Therapeutic activities;Energy conservation;Therapeutic exercises;Neuromuscular education;Manual Therapy;Self-care/ADL training;Therapeutic exercise;Functional Mobility Training;Patient/family education   Plan coordination/hand strength HEP   Consulted and Agree with Plan of Care Patient;Family member/caregiver   Family Member Consulted parents, sister        Problem List Patient Active Problem List   Diagnosis Date Noted  . Adjustment disorder with depressed mood   . Tetraplegia 09/19/2014  . Acute blood loss anemia 09/19/2014  . Cervical spondylosis with myelopathy 09/18/2014  . Radiculopathy of arm 08/28/2014  . Hypogonadism male 09/08/2013  . 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 Cardiomyopathy 04/01/2011  . Wolff-Parkinson-White (WPW) syndrome 04/01/2011    [facility] 10/09/2014, 4:04 PM  [place] [address] [place], [place], [address] Phone: [phone]   Fax:  [place], OTR/L 10/09/2014 4:05 PM

## 2014-10-10 ENCOUNTER — Ambulatory Visit: Payer: Managed Care, Other (non HMO) | Admitting: Physical Therapy

## 2014-10-10 DIAGNOSIS — M6281 Muscle weakness (generalized): Secondary | ICD-10-CM

## 2014-10-10 DIAGNOSIS — M21372 Foot drop, left foot: Secondary | ICD-10-CM

## 2014-10-10 DIAGNOSIS — R269 Unspecified abnormalities of gait and mobility: Secondary | ICD-10-CM | POA: Diagnosis not present

## 2014-10-10 NOTE — Therapy (Signed)
[facility] [address] [place], [place], [address] Phone: [phone]   Fax:  [phone]  Physical Therapy Treatment  Patient Details  Name: Mark Gallagher MRN: [id number] Date of Birth: [birth year] Referring Provider:  Leandrew Koyanagi, MD  Encounter Date: 10/10/2014      PT End of Session - 10/10/14 1828    Visit Number 2   Number of Visits 21   Authorization Type [employer]    PT Start Time 2542   PT Stop Time 1541   PT Time Calculation (min) 54 min   Equipment Utilized During Treatment Cervical collar   Activity Tolerance Patient tolerated treatment well   Behavior During Therapy [facility] for tasks assessed/performed      Past Medical History  Diagnosis Date  . Thyroid disease   . Cancer   . Acromegaly   . Testosterone deficiency   . Knee pain, bilateral   . History of pituitary tumor     Past Surgical History  Procedure Laterality Date  . Cervical disc surgery  2007    Anterior C4/5 vertebrectomy and c3-C6 laminoplasty with plating  . Craniotomy for tumor      pituitary adenoma    There were no vitals filed for this visit.  Visit Diagnosis:  Abnormality of gait  Muscle weakness (generalized)  Foot drop, left      Subjective Assessment - 10/10/14 1452    Pertinent History Cervical precautions. PMH: cervical spondylosis with myelopathy; s/p cervical vertebrectomy C4/5 with C3-6 plating 2007 acromegaly s/p pituitary tumor resection (1994); hypogonadism; Wolff-Parkinson-White Syndrome; cardiomyopathy; acute blood loss anemia; adjustment disorder with depressed mood   Patient Stated Goals "I'd like to be able to walk by myself with no assistance, stand up out of a chair by myself, be able to get up and down stairs without assistance.   Currently in Pain? Yes   Pain Score 2    Pain Location Neck   Pain Orientation Mid   Pain Descriptors / Indicators Aching   Pain Type Acute pain;Surgical pain   Pain Onset 1 to 4 weeks ago   Pain Frequency Intermittent   Aggravating Factors  leaning over to eat   Pain Relieving Factors pain meds   Multiple Pain Sites No            [facility] PT Assessment - 10/10/14 0001    6 minute walk test results    Aerobic Endurance Distance Walked 556   Endurance additional comments with bariatric RW and L AFO  no rest breaks   Standardized Balance Assessment   Standardized Balance Assessment Timed Up and Go Test   Timed Up and Go Test   TUG Normal TUG   Normal TUG (seconds) 28.3  with bariatric RW and L AFO; avg of 3 trials                     [facility] Adult PT Treatment/Exercise - 10/10/14 0001    Transfers   Transfers Sit to Stand;Stand to Sit;Stand Pivot Transfers   Sit to Stand 5: Supervision   Stand to Sit 5: Supervision;4: Min guard   Ambulation/Gait   Ambulation/Gait Yes   Ambulation/Gait Assistance 5: Supervision;4: Min guard   Ambulation Distance (Feet) 556 Feet  6MWT   Assistive device Rolling walker  bariatric RW; L AFO   Gait Pattern Step-through pattern;Decreased hip/knee flexion - left;Decreased weight shift to left;Left flexed knee in stance   Stairs --   Stairs Assistance --  Stair Management Technique --   Number of Stairs --   Height of Stairs --   Gait Comments Completed 6MWT and TUG; see specific outcome measures for detailed findings.   Neuro Re-ed    Neuro Re-ed Details  Performed sit <> stand from 23" mat table (to simulate pt's home bed) with verbal, tactile, and demonstration cueing for anterior pelvic tilt/ B foot placement during setup, full anterior weight shift, erect trunk flexion (focus on hinging at waist for upright posture, to prevent strain on cervical spine). Initially utilized wedge (to address posterior pelvic tilt), provided tactile cueing at L knee for increased proprioceptive input; and provided tactile cueing for full anterior weight shift. Within-session, pt able to perform sit > stand  without wedge or said cueing with supervision, no UE use.   Exercises   Exercises Other Exercises   Other Exercises  With PT instruction and multimodal cueing for technique, pt performed sit <> stand without UE support, Tband-resisted L TKE. See Pt Instructions for details of exercises, reps, sets, resistance.                PT Education - 10/10/14 1825    Education provided Yes   Education Details Explained findings on 6MWT and TUG, functional implications, fall risk. HEP intiated; see Pt instructions.   Person(s) Educated Patient;Parent(s)   Methods Explanation;Demonstration;Tactile cues;Verbal cues;Handout   Comprehension Verbalized understanding;Need further instruction;Verbal cues required          PT Short Term Goals - 10/10/14 1517    PT SHORT TERM GOAL #1   Title Pt will perform initial HEP with mod I using paper handout to indicate safe HEP compliance, maximize functional gains made in PT. Target date: 11/06/14   Baseline Initiated 9/20.   PT SHORT TERM GOAL #2   Title Perform 6 Minute Walk Test to assess/address functional endurance. Target date: 11/06/14   Baseline 9/20: 556' with RW   Status Achieved   PT SHORT TERM GOAL #3   Title Perform TUG to assess/address efficiency of functional mobility. Target date: 11/06/14   Baseline 9/20: TUG time = 28.3 seconds with RW   Status Achieved   PT SHORT TERM GOAL #4   Title Pt will ambulate 200' over level, indoor surfaces with mod I using LRAD to indicate increased safety/indepedence with household ambulation. Target date: 11/06/14   PT SHORT TERM GOAL #5   Title Pt will negotiate 14 stairs with B rails and min A of single person to indicate increased independence accessing second floor of home. Target date: 11/06/14           PT Long Term Goals - 10/09/14 2125    PT LONG TERM GOAL #1   Title Pt will improve 6 Minute Walk distance by 150' from baseline to indicate significant improvement in functional endurance.  Target date: 12/04/14   PT LONG TERM GOAL #2   Title Pt will improve TUG score by 10.8 seconds from baseline to indicate significant improvement in efficiency of functional mobility.  Target date: 12/04/14   PT LONG TERM GOAL #3   Title Pt will ambulate 500' over unlevel, paved surfaces with mod I using LRAD to indicate increased stability/independence with community mobility. Target date: 12/04/14   PT LONG TERM GOAL #4   Title Pt will negotiate standard ramp and curb step with mod I using LRAD to indicate increased safety/indepedence traversing community obstacles. Target date: 12/04/14   PT LONG TERM GOAL #5   Title Pt will  negotiate 14 stairs with 2 rails and mod I to indicate increased independence accessing second floor of home.  Target date: 12/04/14               Plan - 10/10/14 1850    Clinical Impression Statement Skilled session focused on initiating HEP and assessing/addressing functional endurance and fall risk with 6MWT and TUG, respectively. TUG time of 28.3 seconds indicates fall risk. Initiated HEP with focus on functional LE strengthening. Continue per POC.   Pt will benefit from skilled therapeutic intervention in order to improve on the following deficits Abnormal gait;Decreased activity tolerance;Decreased balance;Decreased endurance;Decreased coordination;Decreased mobility;Decreased strength;Impaired sensation;Pain;Other (comment)  pain will be monitored but not directly addressed in PT due to nature of pain   Rehab Potential Good   PT Frequency Other (comment)  3x/week for 4 weeks followed by 2x/week for subsequent 4 weeks    PT Duration Other (comment)  See above.   PT Treatment/Interventions Vestibular;ADLs/Self Care Home Management;DME Instruction;Balance training;Therapeutic exercise;Therapeutic activities;Functional mobility training;Stair training;Gait training;Neuromuscular re-education;Patient/family education   PT Next Visit Plan Check for safe/correct  HEP performance. Add to HEP.   Consulted and Agree with Plan of Care Patient;Family member/caregiver   Family Member Consulted mother/father        Problem List Patient Active Problem List   Diagnosis Date Noted  . Adjustment disorder with depressed mood   . Tetraplegia 09/19/2014  . Acute blood loss anemia 09/19/2014  . Cervical spondylosis with myelopathy 09/18/2014  . Radiculopathy of arm 08/28/2014  . Hypogonadism male 09/08/2013  . 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 Cardiomyopathy 04/01/2011  . Wolff-Parkinson-White (WPW) syndrome 04/01/2011    Billie Ruddy, PT, DPT [facility] [address] [place], [place], [address] Phone: [phone]   Fax:  [phone] 10/10/2014, 6:53 PM

## 2014-10-10 NOTE — Patient Instructions (Signed)
Knee Extension: Terminal - Standing (Single Leg)   Stand with both hands on rolling walker. LEFT foot should be positioned slightly in front of right foot. Stand up nice and tall. Loop RED theraband around the top of your LEFT knee; anchor the other side of the band by placing the knotted ends of the stockinette in the door closure (as we discussed during PT).  Use your glute muscles to straighten out your LEFT knee. * Be sure not to let it go too straight. Relax. Repeat. Perform 20 reps, 3 times per day.  Then, turn around 180 degrees with the band still looped just above LEFT knee. Perform the same exercise, ensuring your knee doesn't straighten out too much. Perform 15 reps, 3 times per day.   Functional Quadriceps: Sit to Stand   Sit on edge of you 23 inch bed with your rolling walker in front of you (just in case). Stand up SLOWLY without using hands. Then SLOWLY sit back down (without plopping).  Perform 10 reps. 3 times per day.

## 2014-10-12 ENCOUNTER — Ambulatory Visit: Payer: Managed Care, Other (non HMO) | Admitting: Occupational Therapy

## 2014-10-12 ENCOUNTER — Ambulatory Visit: Payer: Managed Care, Other (non HMO) | Admitting: Physical Therapy

## 2014-10-12 ENCOUNTER — Telehealth: Payer: Self-pay | Admitting: *Deleted

## 2014-10-12 DIAGNOSIS — R279 Unspecified lack of coordination: Principal | ICD-10-CM

## 2014-10-12 DIAGNOSIS — R269 Unspecified abnormalities of gait and mobility: Secondary | ICD-10-CM | POA: Diagnosis not present

## 2014-10-12 DIAGNOSIS — M21372 Foot drop, left foot: Secondary | ICD-10-CM

## 2014-10-12 DIAGNOSIS — M6281 Muscle weakness (generalized): Secondary | ICD-10-CM

## 2014-10-12 DIAGNOSIS — R278 Other lack of coordination: Secondary | ICD-10-CM

## 2014-10-12 DIAGNOSIS — R29898 Other symptoms and signs involving the musculoskeletal system: Secondary | ICD-10-CM

## 2014-10-12 NOTE — Therapy (Signed)
[facility] [address] [place], [place], [address] Phone: [phone]   Fax:  [phone]  Physical Therapy Treatment  Patient Details  Name: Mark Gallagher MRN: [id number] Date of Birth: 1970/07/17 Referring Provider:  Leandrew Koyanagi, MD  Encounter Date: 10/12/2014      PT End of Session - 10/12/14 2104    Visit Number 3   Number of Visits 21   Date for PT Re-Evaluation 12/08/14   Authorization Type Aet[employer]    PT Start Time [phone]   PT Stop Time 1021   PT Time Calculation (min) 47 min   Equipment Utilized During Treatment Cervical collar   Activity Tolerance Patient tolerated treatment well;No increased pain;Patient limited by fatigue  frequent rest breaks required   Behavior During Therapy [facility] for tasks assessed/performed      Past Medical History  Diagnosis Date  . Thyroid disease   . Cancer   . Acromegaly   . Testosterone deficiency   . Knee pain, bilateral   . History of pituitary tumor     Past Surgical History  Procedure Laterality Date  . Cervical disc surgery  2007    Anterior C4/5 vertebrectomy and c3-C6 laminoplasty with plating  . Craniotomy for tumor      pituitary adenoma    There were no vitals filed for this visit.  Visit Diagnosis:  Abnormality of gait  Muscle weakness (generalized)  Foot drop, left      Subjective Assessment - 10/12/14 0938    Subjective Parents report pt able to get out of standard chair in kitchen now, which he has been unable to do thusfar. Surgical pain grossly unchanged. Pt/parents report no issues with HEP.   Patient is accompained by: Family member   Pertinent History Cervical precautions. Hard cervical collar at all times. PMH: cervical spondylosis with myelopathy; s/p cervical vertebrectomy C4/5 with C3-6 plating 2007 acromegaly s/p pituitary tumor resection (1994); hypogonadism; Wolff-Parkinson-White Syndrome; cardiomyopathy; acute blood loss  anemia; adjustment disorder with depressed mood   Patient Stated Goals "I'd like to be able to walk by myself with no assistance, stand up out of a chair by myself, be able to get up and down stairs without assistance.   Currently in Pain? Yes   Pain Score 2    Pain Location Neck   Pain Orientation Mid   Pain Descriptors / Indicators Aching   Pain Type Acute pain;Surgical pain   Pain Onset 1 to 4 weeks ago   Pain Frequency Intermittent   Aggravating Factors  leaning over to eat   Pain Relieving Factors pain meds   Effect of Pain on Daily Activities PT will monitor but will not directly address due to nature of pain   Multiple Pain Sites No                         [facility] Adult PT Treatment/Exercise - 10/12/14 0001    Transfers   Transfers Sit to Stand;Stand to Sit   Sit to Stand 5: Supervision;4: Min assist;6: Modified independent (Device/Increase time)   Sit to Stand Details (indicate cue type and reason) mod I for sit > stand with use of BUE's. (S) to stand with single UE use. Min A to stand from 23" surface (to simulate home bed) without UE support. Cueing focused on avoiding pushing legs against bed to stand.    Stand to Sit 5: Supervision;4: Min assist;6: Modified independent (Device/Increase time)  Stand to Sit Details See above.   Ambulation/Gait   Ambulation/Gait Yes   Ambulation/Gait Assistance 5: Supervision   Ambulation Distance (Feet) 300 Feet   Assistive device Rolling walker  bariatric RW; L AFO   Gait Pattern Step-through pattern;Decreased hip/knee flexion - left;Decreased weight shift to left;Left flexed knee in stance;Trendelenburg  L Trendelenburg with increased fatigue   Ambulation Surface Level;Indoor   Exercises   Exercises Other Exercises   Other Exercises  Pt performed home exercises provided during last session with mod I using paper handout. Modified sit <> stand to enable single UE use to prevent compensation via pt pushing  LE's again  surface. Also modified Tband-resisted TKE such that resistance line of pull is posterior only. Added supine resisted hip ABD. See Pt Instructions for details on all exercises, reps, and sets performed.                PT Education - 10/12/14 2055    Education provided Yes   Education Details HEP: added hip ABD strengthening exercise.   Person(s) Educated Patient   Methods Explanation;Demonstration;Tactile cues;Verbal cues;Handout   Comprehension Verbalized understanding;Returned demonstration          PT Short Term Goals - 10/12/14 2053    PT SHORT TERM GOAL #1   Title Pt will perform initial HEP with mod I using paper handout to indicate safe HEP compliance, maximize functional gains made in PT. Target date: 11/06/14   Baseline Met 9/22.   Status Achieved   PT SHORT TERM GOAL #2   Title Perform 6 Minute Walk Test to assess/address functional endurance. Target date: 11/06/14   Baseline 9/20: 556' with RW   Status Achieved   PT SHORT TERM GOAL #3   Title Perform TUG to assess/address efficiency of functional mobility. Target date: 11/06/14   Baseline 9/20: TUG time = 28.3 seconds with RW   Status Achieved   PT SHORT TERM GOAL #4   Title Pt will ambulate 200' over level, indoor surfaces with mod I using LRAD to indicate increased safety/indepedence with household ambulation. Target date: 11/06/14   PT SHORT TERM GOAL #5   Title Pt will negotiate 14 stairs with B rails and min A of single person to indicate increased independence accessing second floor of home. Target date: 11/06/14           PT Long Term Goals - 10/09/14 2125    PT LONG TERM GOAL #1   Title Pt will improve 6 Minute Walk distance by 150' from baseline to indicate significant improvement in functional endurance. Target date: 12/04/14   PT LONG TERM GOAL #2   Title Pt will improve TUG score by 10.8 seconds from baseline to indicate significant improvement in efficiency of functional mobility.  Target date:  12/04/14   PT LONG TERM GOAL #3   Title Pt will ambulate 500' over unlevel, paved surfaces with mod I using LRAD to indicate increased stability/independence with community mobility. Target date: 12/04/14   PT LONG TERM GOAL #4   Title Pt will negotiate standard ramp and curb step with mod I using LRAD to indicate increased safety/indepedence traversing community obstacles. Target date: 12/04/14   PT LONG TERM GOAL #5   Title Pt will negotiate 14 stairs with 2 rails and mod I to indicate increased independence accessing second floor of home.  Target date: 12/04/14               Plan - 10/12/14 2105    Clinical  Impression Statement STG 1 met for safe/proper performance of initial HEP. Added hip ABD strengthening to address L Trendelenburg noted during this session with increased fatigue. Continue per POC.   Pt will benefit from skilled therapeutic intervention in order to improve on the following deficits Abnormal gait;Decreased activity tolerance;Decreased balance;Decreased endurance;Decreased coordination;Decreased mobility;Decreased strength;Impaired sensation;Pain;Other (comment)  pain will be monitored but not directly addressed in PT due to nature of pain   Rehab Potential Good   PT Frequency Other (comment)  3x/week for 4 weeks followed by 2x/week for subsequent 4 weeks    PT Duration Other (comment)  See above.   PT Treatment/Interventions Vestibular;ADLs/Self Care Home Management;DME Instruction;Balance training;Therapeutic exercise;Therapeutic activities;Functional mobility training;Stair training;Gait training;Neuromuscular re-education;Patient/family education   PT Next Visit Plan Assess outdoor ambulation/mobility. Re-visit stair negotiation.   Consulted and Agree with Plan of Care Patient;Family member/caregiver   Family Member Consulted mother/father        Problem List Patient Active Problem List   Diagnosis Date Noted  . Adjustment disorder with depressed mood    . Tetraplegia 09/19/2014  . Acute blood loss anemia 09/19/2014  . Cervical spondylosis with myelopathy 09/18/2014  . Radiculopathy of arm 08/28/2014  . Hypogonadism male 09/08/2013  . 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 Cardiomyopathy 04/01/2011  . Wolff-Parkinson-White (WPW) syndrome 04/01/2011    Billie Ruddy, PT, DPT [facility] [address] [place], [place], [address] Phone: [phone]   Fax:  [phone] 10/12/2014, 9:08 PM

## 2014-10-12 NOTE — Patient Instructions (Signed)
Knee Extension: Terminal - Standing (Single Leg)   Stand with both hands on rolling walker. LEFT foot should be positioned slightly in front of right foot. Stand up nice and tall. Loop RED theraband around the top of your LEFT knee; anchor the other side of the band by placing the knotted ends of the stockinette in the door closure (as we discussed during PT).  Use your glute muscles to straighten out your LEFT knee. * Be sure not to let it go too straight. Relax. Repeat. Perform 15 reps, 3 times per day.    Functional Quadriceps: Sit to Stand   Sit on edge of you 23 inch bed with your rolling walker in front of you (just in case). Stand up SLOWLY using one hand only. Try to avoid pushing the back fo your knees against the bed to stand. Then SLOWLY sit back down (without plopping).  Perform 10 reps. 3 times per day.    Strengthening: Hip Abductor - Resisted   Lie down flat on back and loop RED band around both legs above knees. Push BOTH thighs outward.Then, SLOWLY  return to starting position. Repeat __10__ times per set. Do __3__ sets per day.

## 2014-10-12 NOTE — Telephone Encounter (Signed)
Mark Gallagher called saying that he is in outpt therapy and he is wearing TED hose.  He is asking for Dr Naaman Plummer phone number.  He is wanting to know about if he has to continue with the TED hose.  His mother Stanton Kidney showed up at the front desk of the office this am (10/12/14) asking about the TED hose I have asked Zella Ball to contact Algis Liming PA about this.

## 2014-10-12 NOTE — Patient Instructions (Signed)
  Coordination Activities  Perform the following activities for 20 minutes 1-2 times per day with left hand(s).   Rotate ball in fingertips (clockwise and counter-clockwise).  Ball push-ups:  Hold ball in fingertips and then bring palm then straighten fingers out.  Rotate 2 golf balls in palm (clock-wise and counter clockwise)  Flip cards 1 at a time as fast as you can.  Deal cards with your thumb (Hold deck in hand and push card off top with thumb).  Pick up coins, buttons, marbles, dried beans/pasta, paperclips, beads, etc. of different sizes and place in container.  Pick up coins and stack.  Pick up coins one at a time until you get 5-10 in your hand, then move coins from palm to fingertips to stack one at a time.  Practice writing.  Finger taps  Continue red putty exercises from the hospital  Adult coloring books

## 2014-10-12 NOTE — Telephone Encounter (Signed)
[name] mother showed up to office asking why her son Mark Gallagher has to wear the TED hose. I called and spoke to Ms. Mark Chew PA regarding the above. She states while [name] was in hospital on rehab floor his lower extremities was noted with pitting edema. This was relayed to Ms. Mark Gallagher ( mother), she states her son is walking 30 minutes a day and attending physical and occupational therapy. She also stated the TEDS stockings were cutting into her son's skin. Mark Gallagher states they have decided not wear the TED's stocking. She was encouraged to follow up with her PCP. Also reiterated why the TEDS were being used, she's adamant about not using them. She will F/U with Dr. Naaman Plummer.

## 2014-10-12 NOTE — Therapy (Signed)
[place] [address] [place], [place], [address] Phone: [phone]   Fax:  [phone]  Occupational Therapy Treatment  Patient Details  Name: Mark Gallagher MRN: [id number] Date of Birth: November 02, 1970 Referring Provider:  Leandrew Koyanagi, MD  Encounter Date: 10/12/2014      OT End of Session - 10/12/14 1335    Visit Number 2   Number of Visits 17   Date for OT Re-Evaluation 12/08/14   Authorization Type [employer], 60 visit limit combined   OT Start Time 1320   OT Stop Time 1407   OT Time Calculation (min) 47 min   Activity Tolerance Patient tolerated treatment well   Behavior During Therapy [facility] for tasks assessed/performed      Past Medical History  Diagnosis Date  . Thyroid disease   . Cancer   . Acromegaly   . Testosterone deficiency   . Knee pain, bilateral   . History of pituitary tumor     Past Surgical History  Procedure Laterality Date  . Cervical disc surgery  2007    Anterior C4/5 vertebrectomy and c3-C6 laminoplasty with plating  . Craniotomy for tumor      pituitary adenoma    There were no vitals filed for this visit.  Visit Diagnosis:  Decreased coordination  Left hand weakness      Subjective Assessment - 10/12/14 1324    Subjective  Pt rports working on handwriting at home.  Pt expresses concerns on whether hand will improve   Patient is accompained by: Family member  parents   Pertinent History C3-T2 fusion 09/10/14 with cardiac surgery after surgery, hx of previous C1-C6 fusion (2007)   Patient Stated Goals return to work   Currently in Pain? Yes   Pain Score 2    Pain Location Neck   Pain Descriptors / Indicators Aching   Aggravating Factors  leaning over to eat   Pain Relieving Factors pain meds           Distal control worksheet for incr pen control in prep for writing with mod difficulty/decr accuracy.                   OT Education -  10/12/14 1325    Education Details Coordination HEP (has red putty from hospital- verbally reviewed)   Person(s) Educated Patient   Methods Explanation;Demonstration;Handout;Verbal cues   Comprehension Verbalized understanding;Returned demonstration          OT Short Term Goals - 10/09/14 1556    OT SHORT TERM GOAL #1   Title Pt will verbalize understanding of coordination/hand strength HEP--check STGs 11/08/14   Time 4   Period Weeks   Status New   OT SHORT TERM GOAL #2   Title Pt will perfom BADLs mod I.   Time 4   Period Weeks   Status New   OT SHORT TERM GOAL #3   Title Pt will improve L hand coordination for ADLs as shown by completing 9-hole peg test in less than 55sec.   Baseline 77.34sec   Time 4   Period Weeks   Status New   OT SHORT TERM GOAL #4   Title Pt will demo at least 40lbs L grip strength for ADLs/opening containers.   Baseline 30lbs   Time 4   Period Weeks   Status New           OT Long Term Goals - 10/09/14 1558    OT LONG TERM GOAL #  1   Title Pt will be independent with proximal UE HEP when cleared from cervical/lifting precautions.--check LTGs 12/09/14   Time 8   Period Weeks   Status New   OT LONG TERM GOAL #2   Title Pt will perform simple meal prep/home maintenance tasks mod I.   Time 8   Period Weeks   Status New   OT LONG TERM GOAL #3   Title Pt will improve L hand coordination for ADLs as shown by completing 9-hole peg test in less than 45sec.   Baseline 77.34sec   Time 8   Period Weeks   Status New   OT LONG TERM GOAL #4   Title Pt will demo at least 55lbs L grip strength for ADLs/opening containers.   Baseline 30lbs   Time 8   Period Weeks   Status New               Plan - 10/12/14 1413    Clinical Impression Statement Pt/family verbalized understanding of coordination HEP once instructed.  Pt demo difficulty with in hand manipulation.   Plan coordination, hand strengthening   Consulted and Agree with Plan of  Care Patient;Family member/caregiver   Family Member Consulted parents        Problem List Patient Active Problem List   Diagnosis Date Noted  . Adjustment disorder with depressed mood   . Tetraplegia 09/19/2014  . Acute blood loss anemia 09/19/2014  . Cervical spondylosis with myelopathy 09/18/2014  . Radiculopathy of arm 08/28/2014  . Hypogonadism male 09/08/2013  . 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 Cardiomyopathy 04/01/2011  . Wolff-Parkinson-White (WPW) syndrome 04/01/2011    [facility] 10/12/2014, 2:20 PM  [place] [address] [place], [place], [address] Phone: [phone]   Fax:  [place], OTR/L 10/12/2014 2:20 PM

## 2014-10-16 ENCOUNTER — Ambulatory Visit: Payer: Managed Care, Other (non HMO) | Admitting: Physical Therapy

## 2014-10-16 ENCOUNTER — Encounter: Payer: Self-pay | Admitting: Occupational Therapy

## 2014-10-16 ENCOUNTER — Ambulatory Visit: Payer: Managed Care, Other (non HMO) | Admitting: Family Medicine

## 2014-10-16 ENCOUNTER — Ambulatory Visit: Payer: Managed Care, Other (non HMO) | Admitting: Occupational Therapy

## 2014-10-16 DIAGNOSIS — R279 Unspecified lack of coordination: Principal | ICD-10-CM

## 2014-10-16 DIAGNOSIS — R269 Unspecified abnormalities of gait and mobility: Secondary | ICD-10-CM

## 2014-10-16 DIAGNOSIS — M21372 Foot drop, left foot: Secondary | ICD-10-CM

## 2014-10-16 DIAGNOSIS — M6281 Muscle weakness (generalized): Secondary | ICD-10-CM

## 2014-10-16 DIAGNOSIS — R278 Other lack of coordination: Secondary | ICD-10-CM

## 2014-10-16 DIAGNOSIS — R29898 Other symptoms and signs involving the musculoskeletal system: Secondary | ICD-10-CM

## 2014-10-16 NOTE — Therapy (Signed)
[place] [address] [place], [place], [address] Phone: [phone]   Fax:  [phone]  Physical Therapy Treatment  Patient Details  Name: Mark Gallagher MRN: [id number] Date of Birth: 1970-09-06 Referring Provider:  Leandrew Koyanagi, MD  Encounter Date: 10/16/2014      PT End of Session - 10/16/14 1803    Visit Number 4   Number of Visits 21   Date for PT Re-Evaluation 12/08/14   Authorization Type [employer]    PT Start Time 9476   PT Stop Time 1617   PT Time Calculation (min) 44 min   Equipment Utilized During Treatment Cervical collar   Activity Tolerance Patient tolerated treatment well;No increased pain   Behavior During Therapy [facility] for tasks assessed/performed      Past Medical History  Diagnosis Date  . Thyroid disease   . Cancer   . Acromegaly   . Testosterone deficiency   . Knee pain, bilateral   . History of pituitary tumor     Past Surgical History  Procedure Laterality Date  . Cervical disc surgery  2007    Anterior C4/5 vertebrectomy and c3-C6 laminoplasty with plating  . Craniotomy for tumor      pituitary adenoma    There were no vitals filed for this visit.  Visit Diagnosis:  Abnormality of gait  Muscle weakness (generalized)  Foot drop, left      Subjective Assessment - 10/16/14 1544    Subjective Pt reports no significant changes. Pt's mother reports that rails have been installed on 7 stairs to enter home. Both parents still helping patient up/down stairs inside townhome.   Patient is accompained by: Family member  mother   Pertinent History Cervical precautions. Hard cervical collar at all times. PMH: cervical spondylosis with myelopathy; s/p cervical vertebrectomy C4/5 with C3-6 plating 2007 acromegaly s/p pituitary tumor resection (1994); hypogonadism; Wolff-Parkinson-White Syndrome; cardiomyopathy; acute blood loss anemia; adjustment disorder with depressed mood   Patient Stated Goals "I'd like to be able to walk by myself with no assistance, stand up out of a chair by myself, be able to get up and down stairs without assistance.   Currently in Pain? Yes   Pain Score 2    Pain Location Neck   Pain Orientation Mid   Pain Descriptors / Indicators Aching   Pain Type Acute pain;Surgical pain   Pain Onset 1 to 4 weeks ago   Pain Frequency Intermittent   Aggravating Factors  leaning over to eat   Pain Relieving Factors rest   Effect of Pain on Daily Activities PT will monitor    Multiple Pain Sites No                         [facility] Adult PT Treatment/Exercise - 10/16/14 1558    Transfers   Transfers Sit to Stand;Stand to Sit   Sit to Stand 5: Supervision;4: Min assist;6: Modified independent (Device/Increase time)   Stand to Sit 5: Supervision;4: Min assist;6: Modified independent (Device/Increase time)   Ambulation/Gait   Ambulation/Gait Yes   Ambulation/Gait Assistance 5: Supervision   Ambulation/Gait Assistance Details x350' outdoors; x250' total indoors  seated rest breaks (< 3 minutes duration) between trials   Ambulation Distance (Feet) 600 Feet   Assistive device Rolling walker;Straight cane  bariatric RW and SPC; L AFO   Gait Pattern Step-through pattern;Decreased hip/knee flexion - left;Decreased weight shift to left;Left flexed knee in stance;Trendelenburg  L Trendelenburg  with increased fatigue   Ambulation Surface Level;Unlevel;Indoor;Outdoor;Paved   Stairs Yes   Stairs Assistance 4: Min guard;4: Tree surgeon Details (indicate cue type and reason) Pt negotiated 4 stairs x4 trials total; initial 3 consecutive trials with single UE support at L rail (to simulate stairs to access 2nd floor of home) with min guard of PT to ascend, min A and tactile cueing for placement during descent.Cueing required for technique (BUE support at L rail, negotiating laterally then diagonally). Final trial with min guard of  mother and cueing for technique, safe LE placement on step during descent.    Stair Management Technique Sideways;Step to pattern;One rail Left   Number of Stairs 12  3 stairs x4 trials   Height of Stairs 6   Door Management 1: +1 Total assist  due to heavy door, cervical precautions   Curb 5: Supervision   Curb Details (indicate cue type and reason) with bariatric RW   Gait Comments Outdoor gait training performed with bariatric RW; indoor gait with bariatric SPC and min guard to min A, multimodal cueing for proper technique/sequencing with SPC.   Exercises   Exercises --   Other Exercises  --                PT Education - 10/16/14 1650    Education provided Yes   Education Details Recommend waiting until further practice of negotiating stairs with single rail in PT prior to attempting at home.   Person(s) Educated Patient;Parent(s)   Methods Explanation   Comprehension Verbalized understanding          PT Short Term Goals - 10/12/14 2053    PT SHORT TERM GOAL #1   Title Pt will perform initial HEP with mod I using paper handout to indicate safe HEP compliance, maximize functional gains made in PT. Target date: 11/06/14   Baseline Met 9/22.   Status Achieved   PT SHORT TERM GOAL #2   Title Perform 6 Minute Walk Test to assess/address functional endurance. Target date: 11/06/14   Baseline 9/20: 556' with RW   Status Achieved   PT SHORT TERM GOAL #3   Title Perform TUG to assess/address efficiency of functional mobility. Target date: 11/06/14   Baseline 9/20: TUG time = 28.3 seconds with RW   Status Achieved   PT SHORT TERM GOAL #4   Title Pt will ambulate 200' over level, indoor surfaces with mod I using LRAD to indicate increased safety/indepedence with household ambulation. Target date: 11/06/14   PT SHORT TERM GOAL #5   Title Pt will negotiate 14 stairs with B rails and min A of single person to indicate increased independence accessing second floor of home.  Target date: 11/06/14           PT Long Term Goals - 10/09/14 2125    PT LONG TERM GOAL #1   Title Pt will improve 6 Minute Walk distance by 150' from baseline to indicate significant improvement in functional endurance. Target date: 12/04/14   PT LONG TERM GOAL #2   Title Pt will improve TUG score by 10.8 seconds from baseline to indicate significant improvement in efficiency of functional mobility.  Target date: 12/04/14   PT LONG TERM GOAL #3   Title Pt will ambulate 500' over unlevel, paved surfaces with mod I using LRAD to indicate increased stability/independence with community mobility. Target date: 12/04/14   PT LONG TERM GOAL #4   Title Pt will negotiate standard ramp and  curb step with mod I using LRAD to indicate increased safety/indepedence traversing community obstacles. Target date: 12/04/14   PT LONG TERM GOAL #5   Title Pt will negotiate 14 stairs with 2 rails and mod I to indicate increased independence accessing second floor of home.  Target date: 12/04/14               Plan - 10/16/14 1808    Clinical Impression Statement Skilled session focused on gait training with SPC and stair negotiation. Pt required min guard - min A for all gait with bariatric SPC. Pt will require further practice of lateral stair negotiation technique prior to performing with assistance of parents at home. Continue per POC.   Pt will benefit from skilled therapeutic intervention in order to improve on the following deficits Abnormal gait;Decreased activity tolerance;Decreased balance;Decreased endurance;Decreased coordination;Decreased mobility;Decreased strength;Impaired sensation;Pain;Other (comment)  pain will be monitored but not directly addressed in PT due to nature of pain   Rehab Potential Good   PT Frequency Other (comment)  3x/week for 4 weeks followed by 2x/week for subsequent 4 weeks    PT Duration Other (comment)  See above.   PT Treatment/Interventions Vestibular;ADLs/Self  Care Home Management;DME Instruction;Balance training;Therapeutic exercise;Therapeutic activities;Functional mobility training;Stair training;Gait training;Neuromuscular re-education;Patient/family education   PT Next Visit Plan Practice lateral stair negotiation with mother/father. Continue gait training with SPC.   Consulted and Agree with Plan of Care Patient;Family member/caregiver   Family Member Consulted mother        Problem List Patient Active Problem List   Diagnosis Date Noted  . Adjustment disorder with depressed mood   . Tetraplegia 09/19/2014  . Acute blood loss anemia 09/19/2014  . Cervical spondylosis with myelopathy 09/18/2014  . Radiculopathy of arm 08/28/2014  . Hypogonadism male 09/08/2013  . 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 Cardiomyopathy 04/01/2011  . Wolff-Parkinson-White (WPW) syndrome 04/01/2011    Billie Ruddy, PT, DPT [facility] [address] [place], [place], [address] Phone: [phone]   Fax:  [phone] 10/16/2014, 6:10 PM

## 2014-10-16 NOTE — Therapy (Signed)
[place] [address] [place], [place], [address] Phone: [phone]   Fax:  [phone]  Occupational Therapy Treatment  Patient Details  Name: Mark Gallagher MRN: [id number] Date of Birth: 12/17/1970 Referring Provider:  Leandrew Koyanagi, MD  Encounter Date: 10/16/2014      OT End of Session - 10/16/14 1515    Visit Number 3   Number of Visits 17   Date for OT Re-Evaluation 12/08/14   Authorization Type [employer], 60 visit limit combined   OT Start Time 1445   OT Stop Time 1530   OT Time Calculation (min) 45 min   Activity Tolerance Patient tolerated treatment well   Behavior During Therapy [facility] for tasks assessed/performed      Past Medical History  Diagnosis Date  . Thyroid disease   . Cancer   . Acromegaly   . Testosterone deficiency   . Knee pain, bilateral   . History of pituitary tumor     Past Surgical History  Procedure Laterality Date  . Cervical disc surgery  2007    Anterior C4/5 vertebrectomy and c3-C6 laminoplasty with plating  . Craniotomy for tumor      pituitary adenoma    There were no vitals filed for this visit.  Visit Diagnosis:  Decreased coordination  Muscle weakness (generalized)  Left hand weakness      Subjective Assessment - 10/16/14 1459    Subjective  Been working on HEP, goes to surgeons next week   Patient is accompained by: Family member  mother   Pertinent History C3-T2 fusion 09/10/14 with cardiac surgery after surgery, hx of previous C1-C6 fusion (2007)   Patient Stated Goals return to work   Currently in Pain? Yes   Pain Score 2    Pain Location Neck   Pain Descriptors / Indicators Aching   Pain Type Acute pain;Surgical pain   Aggravating Factors  leaning over to eat   Pain Relieving Factors pain meds                      OT Treatments/Exercises (OP) - 10/16/14 0001    ADLs   Work Typing test with 5wpm and 90% accuracy.  Pt demo  incr time/difficulty with L hand.   Exercises   Exercises Hand   Hand Exercises   Hand Gripper with Medium Beads Picking up blocks using 25lbs sustained grip strength with mod difficulty.   Fine Motor Coordination   Fine Motor Coordination Grooved pegs;Small Pegboard   Small Pegboard Placing small pegs to place in pegboard to copy design with incr time/min difficulty   Grooved pegs placing in 2 rows with significant incr time/mod-max difficulty.                 OT Education - 10/16/14 1506    Education Details Recommended performing light home maintenance tasks in standing (without bending) to incr endurance (folding laundry)   Person(s) Educated Patient   Methods Explanation   Comprehension Verbalized understanding          OT Short Term Goals - 10/09/14 1556    OT SHORT TERM GOAL #1   Title Pt will verbalize understanding of coordination/hand strength HEP--check STGs 11/08/14   Time 4   Period Weeks   Status New   OT SHORT TERM GOAL #2   Title Pt will perfom BADLs mod I.   Time 4   Period Weeks   Status New   OT SHORT  TERM GOAL #3   Title Pt will improve L hand coordination for ADLs as shown by completing 9-hole peg test in less than 55sec.   Baseline 77.34sec   Time 4   Period Weeks   Status New   OT SHORT TERM GOAL #4   Title Pt will demo at least 40lbs L grip strength for ADLs/opening containers.   Baseline 30lbs   Time 4   Period Weeks   Status New           OT Long Term Goals - 10/09/14 1558    OT LONG TERM GOAL #1   Title Pt will be independent with proximal UE HEP when cleared from cervical/lifting precautions.--check LTGs 12/09/14   Time 8   Period Weeks   Status New   OT LONG TERM GOAL #2   Title Pt will perform simple meal prep/home maintenance tasks mod I.   Time 8   Period Weeks   Status New   OT LONG TERM GOAL #3   Title Pt will improve L hand coordination for ADLs as shown by completing 9-hole peg test in less than 45sec.    Baseline 77.34sec   Time 8   Period Weeks   Status New   OT LONG TERM GOAL #4   Title Pt will demo at least 55lbs L grip strength for ADLs/opening containers.   Baseline 30lbs   Time 8   Period Weeks   Status New               Plan - 10/16/14 1538    Clinical Impression Statement Pt continues to demo difficulty with in-hand manipulation, but demo improved ability to pick up small objects.   Plan coordination, hand strengthening, typing   Consulted and Agree with Plan of Care Patient;Family member/caregiver   Family Member Consulted mother        Problem List Patient Active Problem List   Diagnosis Date Noted  . Adjustment disorder with depressed mood   . Tetraplegia 09/19/2014  . Acute blood loss anemia 09/19/2014  . Cervical spondylosis with myelopathy 09/18/2014  . Radiculopathy of arm 08/28/2014  . Hypogonadism male 09/08/2013  . 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 Cardiomyopathy 04/01/2011  . Wolff-Parkinson-White (WPW) syndrome 04/01/2011    [facility] 10/16/2014, 3:39 PM  [place] [address] [place], [place], [address] Phone: [phone]   Fax:  [place], OTR/L 10/16/2014 3:39 PM

## 2014-10-17 ENCOUNTER — Ambulatory Visit: Payer: Managed Care, Other (non HMO) | Admitting: Physical Therapy

## 2014-10-17 ENCOUNTER — Ambulatory Visit: Payer: Managed Care, Other (non HMO) | Admitting: Occupational Therapy

## 2014-10-17 DIAGNOSIS — R269 Unspecified abnormalities of gait and mobility: Secondary | ICD-10-CM

## 2014-10-17 DIAGNOSIS — R531 Weakness: Secondary | ICD-10-CM

## 2014-10-17 DIAGNOSIS — M6281 Muscle weakness (generalized): Secondary | ICD-10-CM

## 2014-10-17 DIAGNOSIS — R279 Unspecified lack of coordination: Principal | ICD-10-CM

## 2014-10-17 DIAGNOSIS — R278 Other lack of coordination: Secondary | ICD-10-CM

## 2014-10-17 DIAGNOSIS — R29898 Other symptoms and signs involving the musculoskeletal system: Secondary | ICD-10-CM

## 2014-10-17 NOTE — Therapy (Signed)
[place] [address] [place], [place], [address] Phone: [phone]   Fax:  [phone]  Occupational Therapy Treatment  Patient Details  Name: Mark Gallagher MRN: [id number] Date of Birth: 1970/12/29 Referring Provider:  Leandrew Koyanagi, MD  Encounter Date: 10/17/2014      OT End of Session - 10/17/14 1408    Visit Number 4   Number of Visits 17   Date for OT Re-Evaluation 12/08/14   Authorization Type [employer], 60 visit limit combined   OT Start Time 1402   OT Stop Time 1445   OT Time Calculation (min) 43 min   Activity Tolerance Patient tolerated treatment well   Behavior During Therapy [facility] for tasks assessed/performed      Past Medical History  Diagnosis Date  . Thyroid disease   . Cancer   . Acromegaly   . Testosterone deficiency   . Knee pain, bilateral   . History of pituitary tumor     Past Surgical History  Procedure Laterality Date  . Cervical disc surgery  2007    Anterior C4/5 vertebrectomy and c3-C6 laminoplasty with plating  . Craniotomy for tumor      pituitary adenoma    There were no vitals filed for this visit.  Visit Diagnosis:  Decreased coordination  Muscle weakness (generalized)  Left hand weakness      Subjective Assessment - 10/17/14 1406    Pain Score 2    Pain Location Neck   Pain Descriptors / Indicators Aching   Pain Type Acute pain;Surgical pain   Pain Frequency Intermittent   Aggravating Factors  leaning over   Pain Relieving Factors rest                      OT Treatments/Exercises (OP) - 10/17/14 0001    ADLs   Eating Simulated eating with LUE to scoop dried beans with spoon and place in raised container.  Pt with only 1 minor spill initially, but then good control.   Writing Writing 5 sentences with significant incr time and 95% legibility.   Work Practiced typing [employer]  approx 90% accuracy, but required approx 53min to type  50 words.   Exercises   Exercises Hand   Hand Exercises   Theraputty - Locate Pegs pulling pegs from red putty for incr hand strength                OT Education - 10/17/14 1506    Education Details Recommended pt attempt to eat with LUE some at home (with spoon with "sticky" foods)   Person(s) Educated Patient;Parent(s)   Methods Explanation   Comprehension Verbalized understanding          OT Short Term Goals - 10/09/14 1556    OT SHORT TERM GOAL #1   Title Pt will verbalize understanding of coordination/hand strength HEP--check STGs 11/08/14   Time 4   Period Weeks   Status New   OT SHORT TERM GOAL #2   Title Pt will perfom BADLs mod I.   Time 4   Period Weeks   Status New   OT SHORT TERM GOAL #3   Title Pt will improve L hand coordination for ADLs as shown by completing 9-hole peg test in less than 55sec.   Baseline 77.34sec   Time 4   Period Weeks   Status New   OT SHORT TERM GOAL #4   Title Pt will demo at least 40lbs  L grip strength for ADLs/opening containers.   Baseline 30lbs   Time 4   Period Weeks   Status New           OT Long Term Goals - 10/09/14 1558    OT LONG TERM GOAL #1   Title Pt will be independent with proximal UE HEP when cleared from cervical/lifting precautions.--check LTGs 12/09/14   Time 8   Period Weeks   Status New   OT LONG TERM GOAL #2   Title Pt will perform simple meal prep/home maintenance tasks mod I.   Time 8   Period Weeks   Status New   OT LONG TERM GOAL #3   Title Pt will improve L hand coordination for ADLs as shown by completing 9-hole peg test in less than 45sec.   Baseline 77.34sec   Time 8   Period Weeks   Status New   OT LONG TERM GOAL #4   Title Pt will demo at least 55lbs L grip strength for ADLs/opening containers.   Baseline 30lbs   Time 8   Period Weeks   Status New               Plan - 10/17/14 1505    Clinical Impression Statement Pt progressing towards goals with incr ease  with writing and typing, but needs significant amount of time.   Plan coordination, hand strengthening   Consulted and Agree with Plan of Care Patient;Family member/caregiver   Family Member Consulted parents        Problem List Patient Active Problem List   Diagnosis Date Noted  . Adjustment disorder with depressed mood   . Tetraplegia 09/19/2014  . Acute blood loss anemia 09/19/2014  . Cervical spondylosis with myelopathy 09/18/2014  . Radiculopathy of arm 08/28/2014  . Hypogonadism male 09/08/2013  . 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 Cardiomyopathy 04/01/2011  . Wolff-Parkinson-White (WPW) syndrome 04/01/2011    [facility] 10/17/2014, 3:09 PM  [place] [address] [place], [place], [address] Phone: [phone]   Fax:  [place], OTR/L 10/17/2014 3:09 PM

## 2014-10-17 NOTE — Therapy (Signed)
[facility] [address] [place], [place], [address] Phone: [phone]   Fax:  [phone]  Physical Therapy Treatment  Patient Details  Name: Mark Gallagher MRN: [id number] Date of Birth: 1970/03/19 Referring Provider:  Leandrew Koyanagi, MD  Encounter Date: 10/17/2014      PT End of Session - 10/17/14 1637    Visit Number 5   Number of Visits 21   Date for PT Re-Evaluation 12/08/14   Authorization Type Aetna    PT Start Time 1446   PT Stop Time 1533   PT Time Calculation (min) 47 min   Equipment Utilized During Treatment Cervical collar   Activity Tolerance Patient tolerated treatment well;No increased pain   Behavior During Therapy [facility] for tasks assessed/performed      Past Medical History  Diagnosis Date  . Thyroid disease   . Cancer   . Acromegaly   . Testosterone deficiency   . Knee pain, bilateral   . History of pituitary tumor     Past Surgical History  Procedure Laterality Date  . Cervical disc surgery  2007    Anterior C4/5 vertebrectomy and c3-C6 laminoplasty with plating  . Craniotomy for tumor      pituitary adenoma    There were no vitals filed for this visit.  Visit Diagnosis:  Abnormality of gait  Weakness generalized      Subjective Assessment - 10/17/14 1507    Subjective "Mark Gallagher bathed and dressed himself completely for the first time today," per mother. No significant changes to report.    Patient is accompained by: Family member  mother/father                         [facility] Adult PT Treatment/Exercise - 10/17/14 1625    Transfers   Transfers Sit to Stand;Stand to Sit   Sit to Stand 5: Supervision;4: Min assist;6: Modified independent (Device/Increase time)   Stand to Sit 5: Supervision;4: Min assist;6: Modified independent (Device/Increase time)   Ambulation/Gait   Ambulation/Gait Yes   Ambulation/Gait Assistance 5: Supervision;4: Min guard;4: Min  assist   Ambulation/Gait Assistance Details Performed multiple trials of gait training x10' with BUE support at parallel bars then single UE support at parallel bars with multimodal cueing for increased L hip/knee flexion during LLE advancement, L heel strike, anterior weight shift (emphasis during RLE advancement), and L stance stability to prevent L genu recurvatum (1 occurence this session).   Ambulation Distance (Feet) 450 Feet   Assistive device Rolling walker;Straight cane  bariatric RW and SPC; L AFO   Gait Pattern Step-through pattern;Decreased hip/knee flexion - left;Decreased weight shift to left;Left flexed knee in stance;Trendelenburg;Left genu recurvatum  L Trendelenburg with increased fatigue   Ambulation Surface Level;Indoor   Stairs Yes   Stairs Assistance 4: Min guard;4: Min assist   Stairs Assistance Details (indicate cue type and reason) with min guard of mother (front) and father (back) and intermittent verbal cues from father for LLE placement on descent   Stair Management Technique Sideways;Step to pattern;One rail Left   Number of Stairs 12  4 stairs x3 trials   Height of Stairs 6   Door Management --   Curb --   Gait Comments --   Exercises   Exercises Other Exercises   Other Exercises  Pt performed supine Tband-resisted ABD x10 reps with cueing for technique with effective return demonstration.  PT Education - 10/17/14 1624    Education provided Yes   Education Details Lateral stair negotiatio technique   Person(s) Educated Patient;Parent(s)   Methods Explanation;Demonstration;Tactile cues;Verbal cues   Comprehension Verbalized understanding;Returned demonstration          PT Short Term Goals - 10/12/14 2053    PT SHORT TERM GOAL #1   Title Pt will perform initial HEP with mod I using paper handout to indicate safe HEP compliance, maximize functional gains made in PT. Target date: 11/06/14   Baseline Met 9/22.   Status Achieved    PT SHORT TERM GOAL #2   Title Perform 6 Minute Walk Test to assess/address functional endurance. Target date: 11/06/14   Baseline 9/20: 556' with RW   Status Achieved   PT SHORT TERM GOAL #3   Title Perform TUG to assess/address efficiency of functional mobility. Target date: 11/06/14   Baseline 9/20: TUG time = 28.3 seconds with RW   Status Achieved   PT SHORT TERM GOAL #4   Title Pt will ambulate 200' over level, indoor surfaces with mod I using LRAD to indicate increased safety/indepedence with household ambulation. Target date: 11/06/14   PT SHORT TERM GOAL #5   Title Pt will negotiate 14 stairs with B rails and min A of single person to indicate increased independence accessing second floor of home. Target date: 11/06/14           PT Long Term Goals - 10/09/14 2125    PT LONG TERM GOAL #1   Title Pt will improve 6 Minute Walk distance by 150' from baseline to indicate significant improvement in functional endurance. Target date: 12/04/14   PT LONG TERM GOAL #2   Title Pt will improve TUG score by 10.8 seconds from baseline to indicate significant improvement in efficiency of functional mobility.  Target date: 12/04/14   PT LONG TERM GOAL #3   Title Pt will ambulate 500' over unlevel, paved surfaces with mod I using LRAD to indicate increased stability/independence with community mobility. Target date: 12/04/14   PT LONG TERM GOAL #4   Title Pt will negotiate standard ramp and curb step with mod I using LRAD to indicate increased safety/indepedence traversing community obstacles. Target date: 12/04/14   PT LONG TERM GOAL #5   Title Pt will negotiate 14 stairs with 2 rails and mod I to indicate increased independence accessing second floor of home.  Target date: 12/04/14               Plan - 10/17/14 1647    Clinical Impression Statement Session continued to focus on gait training with bariatric SPC and stair negotiation. Educated pt's parent's on guarding/cueing pt lateral  stair negotiation to increase pt independence getting up/down stairs to access bedroom on second floor of home. Continue per POC   Pt will benefit from skilled therapeutic intervention in order to improve on the following deficits Abnormal gait;Decreased activity tolerance;Decreased balance;Decreased endurance;Decreased coordination;Decreased mobility;Decreased strength;Impaired sensation;Pain;Other (comment)  pain will be monitored but not directly addressed in PT due to nature of pain   Rehab Potential Good   PT Frequency Other (comment)  3x/week for 4 weeks followed by 2x/week for subsequent 4 weeks    PT Duration Other (comment)  See above.   PT Treatment/Interventions Vestibular;ADLs/Self Care Home Management;DME Instruction;Balance training;Therapeutic exercise;Therapeutic activities;Functional mobility training;Stair training;Gait training;Neuromuscular re-education;Patient/family education   PT Next Visit Plan Continue functional LE strengthening, gait training. Consider tall kneeling for increased proximal stability, proprioceptive input in  LLE   Consulted and Agree with Plan of Care Patient;Family member/caregiver   Family Member Consulted mother and father        Problem List Patient Active Problem List   Diagnosis Date Noted  . Adjustment disorder with depressed mood   . Tetraplegia 09/19/2014  . Acute blood loss anemia 09/19/2014  . Cervical spondylosis with myelopathy 09/18/2014  . Radiculopathy of arm 08/28/2014  . Hypogonadism male 09/08/2013  . 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 Cardiomyopathy 04/01/2011  . Wolff-Parkinson-White (WPW) syndrome 04/01/2011    Billie Ruddy, PT, DPT [facility] [address] [place], [place], [address] Phone: [phone]   Fax:  [phone] 10/17/2014, 4:48 PM

## 2014-10-19 ENCOUNTER — Ambulatory Visit: Payer: Managed Care, Other (non HMO) | Admitting: Rehabilitation

## 2014-10-19 ENCOUNTER — Ambulatory Visit (INDEPENDENT_AMBULATORY_CARE_PROVIDER_SITE_OTHER): Payer: Managed Care, Other (non HMO)

## 2014-10-19 ENCOUNTER — Ambulatory Visit (INDEPENDENT_AMBULATORY_CARE_PROVIDER_SITE_OTHER): Payer: Managed Care, Other (non HMO) | Admitting: Family Medicine

## 2014-10-19 VITALS — BP 120/78 | HR 72 | Temp 98.4°F | Resp 16 | Ht >= 80 in | Wt 231.0 lb

## 2014-10-19 DIAGNOSIS — M4322 Fusion of spine, cervical region: Secondary | ICD-10-CM | POA: Diagnosis not present

## 2014-10-19 DIAGNOSIS — R001 Bradycardia, unspecified: Secondary | ICD-10-CM | POA: Diagnosis not present

## 2014-10-19 DIAGNOSIS — E291 Testicular hypofunction: Secondary | ICD-10-CM

## 2014-10-19 MED ORDER — TESTOSTERONE CYPIONATE 200 MG/ML IM SOLN
200.0000 mg | INTRAMUSCULAR | Status: DC
  Administered 2014-10-19 – 2015-06-15 (×9): 200 mg via INTRAMUSCULAR

## 2014-10-19 NOTE — Patient Instructions (Addendum)
Please move slowly and carefully, and be sure to use your walker.   If you have any further problems overnight please call me or go to the ER ([facility] might make sense if you do have to go)  Your x-rays look ok to me; I will let you know if the radiologist has any other concerns   Appt tomorrow 9/30 at 8 am with [facility] electrophysiology with Talbot Grumbling Address is [place]

## 2014-10-19 NOTE — Progress Notes (Addendum)
[facility] [address] [place] [address] [phone]  Date:  10/19/2014   Name:  Mark Gallagher   DOB:  Oct 30, 1970   MRN:  [id number]  PCP:  Leandrew Koyanagi, MD    Chief Complaint: Dizziness   History of Present Illness:  Mark Gallagher is a 44 y.o. very pleasant male patient who presents with the following:  History of acromegaly, tetraplegia, cervical spondylosis.  Here today to evaluate following a fall that occurred yesterday.   He was recently admitted to [facility] rehab from 8/29 to 9/15; he had a C2-T3 decompression and fusion on 8/20 at [facility].  He had anemia post-op requiring transfusion and then SVT requiring cardioversion.  Recurrent SVT required an ablation on 8/27 Prior to this he did not have any known cardiac concerns.  Most regent Hg in our system was 10.9, then 11.9 at appt yesterday  They were at [facility] yesterday for endocrine appt- noted to have a pulse of 40. However he was feeling ok so nothing further was done at that time He has an appt to be seen by [facility] cardiology- however this is not for about 10 days from now.  Last night he had a couple of dizzy episodes and fell once- it sounds like he actually passed out momentarily but is not quite sure, his sister noted that his pulse was down to 40 BPM around the time of his syncope. He was only out for a moment or so, no incontinence occurred.   He does not think that he injured himself when he fell, but would like to have an x-ray of his neck to ensure all ok He describes the dizziness as vertigo, but also seemed to have some lightheadedness  He was also given his testosterone Rx yesterday- they filled this and would like to get his shot today.  He has the medication with him.   Per instructions given by [facility] endocrinology yesterday (they have his AVS from their system) he is to receive depo- testosterone 200/mg, inject 15ml (200 mg) every 14 days.  We will give this today  Garlon denies any  pain, no headache. His nerve pain that triggered his neck surgery is improved.   He does feel that he is tired today but no more specific sx.   Patient Active Problem List   Diagnosis Date Noted  . Adjustment disorder with depressed mood   . Tetraplegia 09/19/2014  . Acute blood loss anemia 09/19/2014  . Cervical spondylosis with myelopathy 09/18/2014  . Radiculopathy of arm 08/28/2014  . Hypogonadism male 09/08/2013  . 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 Cardiomyopathy 04/01/2011  . Wolff-Parkinson-White (WPW) syndrome 04/01/2011    Past Medical History  Diagnosis Date  . Thyroid disease   . Cancer   . Acromegaly   . Testosterone deficiency   . Knee pain, bilateral   . History of pituitary tumor     Past Surgical History  Procedure Laterality Date  . Cervical disc surgery  2007    Anterior C4/5 vertebrectomy and c3-C6 laminoplasty with plating  . Craniotomy for tumor      pituitary adenoma    Social History  Substance Use Topics  . Smoking status: Never Smoker   . Smokeless tobacco: None  . Alcohol Use: No    Family History  Problem Relation Age of Onset  . High blood pressure Mother   . Acute myelogenous leukemia Brother     Allergies  Allergen Reactions  .  Oxycodone     Nausea?    Medication list has been reviewed and updated.  Current Outpatient Prescriptions on File Prior to Visit  Medication Sig Dispense Refill  . ferrous sulfate 325 (65 FE) MG tablet Take 1 tablet (325 mg total) by mouth 2 (two) times daily with a meal. 60 tablet 1  . levothyroxine (SYNTHROID, LEVOTHROID) 200 MCG tablet Take 1 tablet (200 mcg total) by mouth daily.    . Naproxen Sodium (ALEVE PO) Take by mouth.    Marland Kitchen octreotide (SANDOSTATIN LAR DEPOT) 30 MG injection Inject 30 mg into the muscle every 28 (twenty-eight) days. 1 each 0  . senna-docusate (SENOKOT-S) 8.6-50 MG per tablet Take 2 tablets by mouth 2 (two) times daily. For constipation--adjust as needed 100 tablet 1  . acetaminophen  (TYLENOL) 325 MG tablet Take 1-2 tablets (325-650 mg total) by mouth every 4 (four) hours as needed for mild pain. (Patient not taking: Reported on 10/09/2014)    . diclofenac (VOLTAREN) 50 MG EC tablet Take by mouth.    . famotidine (PEPCID) 20 MG tablet Take 1 tablet (20 mg total) by mouth 2 (two) times daily. For reflux (Patient not taking: Reported on 10/09/2014) 60 tablet 1  . fluticasone (FLONASE) 50 MCG/ACT nasal spray Place 2 sprays into both nostrils daily. (Patient not taking: Reported on 10/09/2014) 9.9 g 1  . hydrocortisone (CORTEF) 20 MG tablet Take 20 mg in am and 10 mg in evenings (Patient not taking: Reported on 10/19/2014)    . Menthol-Methyl Salicylate (MUSCLE RUB) 10-15 % CREA Apply 1 application topically as needed for muscle pain. (Patient not taking: Reported on 10/09/2014)  0  . traMADol (ULTRAM) 50 MG tablet Take 1 tablet (50 mg total) by mouth every 6 (six) hours as needed for moderate pain. (Patient not taking: Reported on 10/09/2014) 60 tablet 0   Current Facility-Administered Medications on File Prior to Visit  Medication Dose Route Frequency Provider Last Rate Last Dose  . testosterone cypionate (DEPOTESTOTERONE CYPIONATE) injection 150 mg  150 mg Intramuscular Q21 days Darlyne Russian, MD   150 mg at 02/01/14 1215    Review of Systems:  As per HPI- otherwise negative.   Physical Examination: Filed Vitals:   10/19/14 1154  BP: 120/78  Pulse: 72  Temp: 98.4 F (36.9 C)  Resp: 16   Filed Vitals:   10/19/14 1154  Height: 6\' 8"  (2.032 m)  Weight: 231 lb (104.781 kg)   Body mass index is 25.38 kg/(m^2). Ideal Body Weight: Weight in (lb) to have BMI = 25: 227.1  GEN: WDWN, NAD, Non-toxic, A & O x 3, evidence of acromegaly HEENT: Atraumatic, acromegaly changes of skull. No masses, No LAD.  PEERL Wearing a hard cervical collar- did not remove Ears and Nose: No external deformity. CV: RRR, No M/G/R. No JVD. No thrill. No extra heart sounds. PULM: CTA B, no wheezes,  crackles, rhonchi. No retractions. No resp. distress. No accessory muscle use. ABD: S, NT.   No rebound. No HSM. EXTR: No c/c/e NEURO slow wide based gait, uses a walker.  Normal strength of both hands.  He does wear a left sided AFO PSYCH: Normally interactive. Conversant. Not depressed or anxious appearing.  Calm demeanor.   EKG: sinus rhythm with rate of 68 BPM, RBB that is stable from August.  No other abnl noted.  [facility] reading (PRIMARY) by  Dr. Lorelei Pont. Cervical spine: hardware and fusion, no acute fracture noted, he does have acromegaly.    CERVICAL  SPINE 4+ VIEWS  COMPARISON: None.  FINDINGS: Osteopenia. Bony interbody fusion C3-4 and C4-5 and C5-6. Anterior plate at U9-3 with missing upper screws.  Total laminectomy C3 through C7. Posterior rod and screw fusion C3 through T1. No hardware failure.  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  Negative for fracture.  IMPRESSION: Postsurgical changes as above. 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  Negative for fracture.  Assessment and Plan: Acquired fusion of cervical spine - Plan: DG Cervical Spine Complete  Bradycardia - Plan: EKG 12-Lead  Hypogonadism in male - Plan: testosterone cypionate (DEPOTESTOSTERONE CYPIONATE) injection 200 mg  Here today following an episode of syncope yesterday- may have been triggered by bradycardia.  He does not appear to have damaged his neck- continue to wear hard collar Called [facility] and was able to move his electrophysiology appt to tomorrow am No evidence of acute fracture on neck films today Did his T injection as per endocrinology instructions His other sx of fatigue are non- specific.  Reviewed labs from [facility] drawn yesterday- Hg 11.9. Na 134, TSH low/ T4 normal, LFTs low but OW labs are ok.  In any case, given his recent health issues encouraged them to go to [facility] ER if any changing of his sx or any further syncopal episodes   Called in  the evening and checked in with his sister Ailene Ravel who reports that Ilir is doing well and has felt better as the day went on Signed Lamar Blinks, MD

## 2014-10-20 DIAGNOSIS — M25362 Other instability, left knee: Secondary | ICD-10-CM | POA: Insufficient documentation

## 2014-10-23 ENCOUNTER — Ambulatory Visit: Payer: Managed Care, Other (non HMO) | Attending: Physical Medicine & Rehabilitation | Admitting: Physical Therapy

## 2014-10-23 ENCOUNTER — Encounter: Payer: Self-pay | Admitting: Occupational Therapy

## 2014-10-23 ENCOUNTER — Ambulatory Visit: Payer: Managed Care, Other (non HMO) | Admitting: Occupational Therapy

## 2014-10-23 DIAGNOSIS — R278 Other lack of coordination: Secondary | ICD-10-CM

## 2014-10-23 DIAGNOSIS — R531 Weakness: Secondary | ICD-10-CM | POA: Diagnosis present

## 2014-10-23 DIAGNOSIS — R269 Unspecified abnormalities of gait and mobility: Secondary | ICD-10-CM | POA: Diagnosis present

## 2014-10-23 DIAGNOSIS — M6281 Muscle weakness (generalized): Secondary | ICD-10-CM

## 2014-10-23 DIAGNOSIS — M6289 Other specified disorders of muscle: Secondary | ICD-10-CM | POA: Insufficient documentation

## 2014-10-23 DIAGNOSIS — M21372 Foot drop, left foot: Secondary | ICD-10-CM | POA: Diagnosis present

## 2014-10-23 DIAGNOSIS — R29898 Other symptoms and signs involving the musculoskeletal system: Secondary | ICD-10-CM

## 2014-10-23 DIAGNOSIS — R2681 Unsteadiness on feet: Secondary | ICD-10-CM | POA: Diagnosis present

## 2014-10-23 DIAGNOSIS — R42 Dizziness and giddiness: Secondary | ICD-10-CM | POA: Insufficient documentation

## 2014-10-23 DIAGNOSIS — R279 Unspecified lack of coordination: Principal | ICD-10-CM

## 2014-10-23 NOTE — Therapy (Signed)
[facility] [address] [place], [place], [address] Phone: [phone]   Fax:  [phone]  Occupational Therapy Treatment  Patient Details  Name: Mark Gallagher MRN: [id number] Date of Birth: April 26, 1970 Referring Provider:  Leandrew Koyanagi, MD  Encounter Date: 10/23/2014      OT End of Session - 10/23/14 1459    Visit Number 5   Number of Visits 17   Date for OT Re-Evaluation 12/08/14   Authorization Type Aetna, 60 visit limit combined   OT Start Time 1451   OT Stop Time 1530   OT Time Calculation (min) 39 min   Activity Tolerance Patient tolerated treatment well   Behavior During Therapy [facility] for tasks assessed/performed      Past Medical History  Diagnosis Date  . Thyroid disease   . Cancer ([place])   . Acromegaly ([place])   . Testosterone deficiency   . Knee pain, bilateral   . History of pituitary tumor     Past Surgical History  Procedure Laterality Date  . Cervical disc surgery  2007    Anterior C4/5 vertebrectomy and c3-C6 laminoplasty with plating  . Craniotomy for tumor      pituitary adenoma    There were no vitals filed for this visit.  Visit Diagnosis:  Decreased coordination  Muscle weakness (generalized)  Left hand weakness      Subjective Assessment - 10/23/14 1454    Subjective  Had 2 falls with low heart rate over weekend.  Went to cardiologist last week and had 48hr heart monitor due to low HR.     Patient is accompained by: Family member   Pertinent History C3-T2 fusion 09/10/14 with cardiac surgery after surgery, hx of previous C1-C6 fusion (2007)   Patient Stated Goals return to work   Currently in Pain? Yes   Pain Score 2    Pain Location Neck   Pain Descriptors / Indicators Sore   Aggravating Factors  leaning over   Pain Relieving Factors rest                      OT Treatments/Exercises (OP) - 10/23/14 0001    ADLs   Overall ADLs Pt dressing with  min A for L brace only.   Fine Motor Coordination   Fine Motor Coordination Small Pegboard   Small Pegboard Functional mid-range reaching with LUE to place small pegs in vertical pegboard for coordination and LUE endurance.  Pt with min drops.   Hand Exercises   Other Hand Exercises Picking up blocks using 25lbs sustained grip strength with LUE with min-mod difficulty/drops   Functional Reaching Activities   Mid Level LUE functional reaching to place washers on vertical poles (various sizes) with min difficulty for incr coordination, LUE activity tolerance.                OT Education - 10/23/14 1512    Education Details Recommended pt/parents ask ortho/neuro surgeons about collar, lifting restrictions, if cleared for overhead reaching/movement   Person(s) Educated Patient   Methods Explanation   Comprehension Verbalized understanding          OT Short Term Goals - 10/09/14 1556    OT SHORT TERM GOAL #1   Title Pt will verbalize understanding of coordination/hand strength HEP--check STGs 11/08/14   Time 4   Period Weeks   Status New   OT SHORT TERM GOAL #2   Title Pt will perfom BADLs mod I.  Time 4   Period Weeks   Status New   OT SHORT TERM GOAL #3   Title Pt will improve L hand coordination for ADLs as shown by completing 9-hole peg test in less than 55sec.   Baseline 77.34sec   Time 4   Period Weeks   Status New   OT SHORT TERM GOAL #4   Title Pt will demo at least 40lbs L grip strength for ADLs/opening containers.   Baseline 30lbs   Time 4   Period Weeks   Status New           OT Long Term Goals - 10/09/14 1558    OT LONG TERM GOAL #1   Title Pt will be independent with proximal UE HEP when cleared from cervical/lifting precautions.--check LTGs 12/09/14   Time 8   Period Weeks   Status New   OT LONG TERM GOAL #2   Title Pt will perform simple meal prep/home maintenance tasks mod I.   Time 8   Period Weeks   Status New   OT LONG TERM GOAL #3    Title Pt will improve L hand coordination for ADLs as shown by completing 9-hole peg test in less than 45sec.   Baseline 77.34sec   Time 8   Period Weeks   Status New   OT LONG TERM GOAL #4   Title Pt will demo at least 55lbs L grip strength for ADLs/opening containers.   Baseline 30lbs   Time 8   Period Weeks   Status New               Plan - 10/23/14 1509    Clinical Impression Statement Pt progressing with ADLs per pt report  (dressing with min A for L brace ony).  Pt/family report falls due to low HR.    Plan coordination, hand strengthening, UBE no resistance   Consulted and Agree with Plan of Care Patient;Family member/caregiver   Family Member Consulted parents        Problem List Patient Active Problem List   Diagnosis Date Noted  . Adjustment disorder with depressed mood   . Tetraplegia ([place]) 09/19/2014  . Acute blood loss anemia 09/19/2014  . Cervical spondylosis with myelopathy 09/18/2014  . Radiculopathy of arm 08/28/2014  . Hypogonadism male 09/08/2013  . Acromegaly ([place]) 04/01/2011  . Cardiomyopathy 04/01/2011  . Wolff-Parkinson-White (WPW) syndrome 04/01/2011    [facility] 10/23/2014, 4:47 PM  [facility] [address] [place], [place], [address] Phone: [phone]   Fax:  [place], OTR/L 10/23/2014 4:47 PM

## 2014-10-23 NOTE — Therapy (Signed)
[facility] [address] [place], [place], [address] Phone: [phone]   Fax:  [phone]  Physical Therapy Treatment  Patient Details  Name: Mark Gallagher MRN: [id number] Date of Birth: 08-09-1970 Referring Provider:  Leandrew Koyanagi, MD  Encounter Date: 10/23/2014      PT End of Session - 10/23/14 1929    Visit Number 6   Number of Visits 21   Date for PT Re-Evaluation 12/08/14   Authorization Type [employer]et[employer]    PT Start Time 1530   PT Stop Time 1615   PT Time Calculation (min) 45 min   Equipment Utilized During Treatment Cervical collar   Activity Tolerance Patient tolerated treatment well   Behavior During Therapy [facility] for tasks assessed/performed      Past Medical History  Diagnosis Date  . Thyroid disease   . Cancer ([place])   . 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 Testosterone deficiency   . Knee pain, bilateral   . History of pituitary tumor     Past Surgical History  Procedure Laterality Date  . Cervical disc surgery  2007    Anterior C4/5 vertebrectomy and c3-C6 laminoplasty with plating  . Craniotomy for tumor      pituitary adenoma    There were no vitals filed for this visit.  Visit Diagnosis:  Dizziness and giddiness  Abnormality of gait      Subjective Assessment - 10/23/14 1535    Subjective Pt has sustained 2 falls since last session. Initial LOB was last Thursday (9/29); father was able to catch patient. Pt fell 2 additional times and did hit the ground but was not injured. Pt describing room-spinning dizziness prior to said falls.   Patient is accompained by: Family member  mother and father   Pertinent History Cervical precautions. Hard cervical collar at all times. PMH: cervical spondylosis with myelopathy; s/p cervical vertebrectomy C4/5 with C3-6 plating 2007 acromegaly s/p pituitary tumor resection (1994); hypogonadism; Wolff-Parkinson-White Syndrome; cardiomyopathy; acute blood loss anemia;  adjustment disorder with depressed mood   Patient Stated Goals "I'd like to be able to walk by myself with no assistance, stand up out of a chair by myself, be able to get up and down stairs without assistance.   Currently in Pain? Yes   Pain Score 3    Pain Location Neck                Vestibular Assessment - 10/23/14 0001    Vestibular Assessment   General Observation Pt reporting "room-spinning dizziness" preceding recent falls x3   Symptom Behavior   Type of Dizziness Spinning   Frequency of Dizziness "mostly at night time"   Duration of Dizziness < 1 minute   Aggravating Factors Rolling to right;Comment  R side lying   Relieving Factors No known relieving factors   Occulomotor Exam   Occulomotor Alignment Abnormal   Positional Testing   Horizontal Canal Testing Horizontal Canal Right;Horizontal Canal Right Intensity;Horizontal Canal Left;Horizontal Canal Left Intensity   Horizontal Canal Right   Horizontal Canal Right Duration 25 seconds   Horizontal Canal Right Symptoms Geotrophic;Nystagmus   Horizontal Canal Left   Horizontal Canal Left Duration 20 seconds   Horizontal Canal Left Symptoms Geotrophic;Nystagmus   Horizontal Canal Right Intensity   Horizontal Canal Right Intensity Severe   Horizontal Canal Left Intensity   Horizontal Canal Left Intensity Moderate                 [facility] Adult PT  Treatment/Exercise - 10/23/14 1925    Neuro Re-ed    Neuro Re-ed Details  Performed Modified Roll Test, which revealed geotropic nystagmus intensity on R > L (20-25 seconds duration) with accompanying vertigo. Findings suggestive of R horizontal canal BPPV. Due to cervical spine collar and precautions, unable to perform BBQ roll maneuver. Therefore, educated pt/parents on modified self-treatment for R horizontal canal BPPV. Also educated pt/parents on how to safely lay on L side in bed for pressure relief/positional change during sleep. Recommending mother or father  provide min guard during all standing/ambulation due to 2 falls and 1 near-fall within the past week. Pt/parents verbalized understanding and were in full agreement.         Vestibular Treatment/Exercise - 10/23/14 0001    Vestibular Treatment/Exercise   Vestibular Treatment Provided Canalith Repositioning   Canalith Repositioning Comment   OTHER   Comment Unable to perform BBQ roll maneuver or Gufani due to cervical spine precautions; therefore instructed pt/parents in self-treatment using modified rolling technique.               PT Education - 10/23/14 1924    Education provided Yes   Education Details Recommending min guard of parents during all standing/walking due to recent onset of frequent falls. Modified self-treatment for R horizontal canal BPPV while adhering to cervical spine precautions.   Person(s) Educated Patient   Methods Explanation;Demonstration   Comprehension Verbalized understanding;Returned demonstration          PT Short Term Goals - 10/12/14 2053    PT SHORT TERM GOAL #1   Title Pt will perform initial HEP with mod I using paper handout to indicate safe HEP compliance, maximize functional gains made in PT. Target date: 11/06/14   Baseline Met 9/22.   Status Achieved   PT SHORT TERM GOAL #2   Title Perform 6 Minute Walk Test to assess/address functional endurance. Target date: 11/06/14   Baseline 9/20: 556' with RW   Status Achieved   PT SHORT TERM GOAL #3   Title Perform TUG to assess/address efficiency of functional mobility. Target date: 11/06/14   Baseline 9/20: TUG time = 28.3 seconds with RW   Status Achieved   PT SHORT TERM GOAL #4   Title Pt will ambulate 200' over level, indoor surfaces with mod I using LRAD to indicate increased safety/indepedence with household ambulation. Target date: 11/06/14   PT SHORT TERM GOAL #5   Title Pt will negotiate 14 stairs with B rails and min A of single person to indicate increased independence  accessing second floor of home. Target date: 11/06/14           PT Long Term Goals - 10/09/14 2125    PT LONG TERM GOAL #1   Title Pt will improve 6 Minute Walk distance by 150' from baseline to indicate significant improvement in functional endurance. Target date: 12/04/14   PT LONG TERM GOAL #2   Title Pt will improve TUG score by 10.8 seconds from baseline to indicate significant improvement in efficiency of functional mobility.  Target date: 12/04/14   PT LONG TERM GOAL #3   Title Pt will ambulate 500' over unlevel, paved surfaces with mod I using LRAD to indicate increased stability/independence with community mobility. Target date: 12/04/14   PT LONG TERM GOAL #4   Title Pt will negotiate standard ramp and curb step with mod I using LRAD to indicate increased safety/indepedence traversing community obstacles. Target date: 12/04/14   PT LONG TERM GOAL #5  Title Pt will negotiate 14 stairs with 2 rails and mod I to indicate increased independence accessing second floor of home.  Target date: 12/04/14               Plan - 10/23/14 1934    Clinical Impression Statement Pt with recent onset of falls x2 within past week. Pt reports that falls are preceded by "spinning" dizziness. Modified testing (due to cervical spine precautions) reveals R horizontal canalithiasis BPPV. Unable to use traditional treatment (BBQ roill maneuver) due to cervical spine precautions; therefore, educated pt/family on modified self-treatment to adhere to cervical precautions. Will plan on re-testing when cervical precautions lifted within 2 weeks. Continue per POC.   Pt will benefit from skilled therapeutic intervention in order to improve on the following deficits Abnormal gait;Decreased activity tolerance;Decreased balance;Decreased endurance;Decreased coordination;Decreased mobility;Decreased strength;Impaired sensation;Pain;Other (comment)  pain will be monitored but not directly addressed in PT due to  nature of pain   Rehab Potential Good   PT Frequency Other (comment)  3x/week for 4 weeks followed by 2x/week for subsequent 4 weeks    PT Duration Other (comment)  See above.   PT Treatment/Interventions Vestibular;ADLs/Self Care Home Management;DME Instruction;Balance training;Therapeutic exercise;Therapeutic activities;Functional mobility training;Stair training;Gait training;Neuromuscular re-education;Patient/family education   PT Next Visit Plan Continue functional LE strengthening, gait training. Consider tall kneeling for increased proximal stability, proprioceptive input in LLE   PT Home Exercise Plan Ask about dizziness, falls. Continue to address goals.   Consulted and Agree with Plan of Care Patient;Family member/caregiver   Family Member Consulted mother and father        Problem List Patient Active Problem List   Diagnosis Date Noted  . Adjustment disorder with depressed mood   . Tetraplegia ([place]) 09/19/2014  . Acute blood loss anemia 09/19/2014  . Cervical spondylosis with myelopathy 09/18/2014  . Radiculopathy of arm 08/28/2014  . Hypogonadism male 09/08/2013  . 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 Cardiomyopathy 04/01/2011  . Wolff-Parkinson-White (WPW) syndrome 04/01/2011   Billie Ruddy, PT, DPT [facility] [address] [place], [place], [address] Phone: [phone]   Fax:  [phone] 10/23/2014, 7:35 PM

## 2014-10-25 ENCOUNTER — Telehealth: Payer: Self-pay

## 2014-10-25 NOTE — Telephone Encounter (Signed)
Pt has followed the directions that dr copland gave him the other day to  Help with constipation but it is not working and it has been 4 days and no improvement and he would like to know what to do next

## 2014-10-25 NOTE — Telephone Encounter (Signed)
Called them back- Mark Gallagher had me talk to his dad. I actually had not discussed constipation with them yet.  They wondered if I had any ideas about how to relieve his constipation that is now going on 5 days. They have tried miralax but only one dose a day- suggested that they increase to 2-3 doses a day until effect. They will do so and keep me posted

## 2014-10-26 ENCOUNTER — Telehealth: Payer: Self-pay

## 2014-10-26 ENCOUNTER — Ambulatory Visit: Payer: Managed Care, Other (non HMO) | Admitting: Occupational Therapy

## 2014-10-26 ENCOUNTER — Ambulatory Visit: Payer: Managed Care, Other (non HMO) | Admitting: Physical Therapy

## 2014-10-26 DIAGNOSIS — R269 Unspecified abnormalities of gait and mobility: Secondary | ICD-10-CM

## 2014-10-26 DIAGNOSIS — F320 Major depressive disorder, single episode, mild: Secondary | ICD-10-CM

## 2014-10-26 DIAGNOSIS — R29898 Other symptoms and signs involving the musculoskeletal system: Secondary | ICD-10-CM

## 2014-10-26 DIAGNOSIS — M6281 Muscle weakness (generalized): Secondary | ICD-10-CM

## 2014-10-26 DIAGNOSIS — R278 Other lack of coordination: Secondary | ICD-10-CM

## 2014-10-26 DIAGNOSIS — R42 Dizziness and giddiness: Secondary | ICD-10-CM | POA: Diagnosis not present

## 2014-10-26 DIAGNOSIS — R279 Unspecified lack of coordination: Secondary | ICD-10-CM

## 2014-10-26 NOTE — Therapy (Signed)
[facility] [address] [place], [place], [address] Phone: [phone]   Fax:  [phone]  Physical Therapy Treatment  Patient Details  Name: Mark Gallagher MRN: [id number] Date of Birth: 12/18/1970 Referring Provider:  Leandrew Koyanagi, MD  Encounter Date: 10/26/2014      PT End of Session - 10/26/14 1629    Visit Number 7   Number of Visits 21   Date for PT Re-Evaluation 12/08/14   Authorization Type Aetna    PT Start Time 1445   PT Stop Time 1527   PT Time Calculation (min) 42 min   Equipment Utilized During Treatment Cervical collar   Activity Tolerance Patient tolerated treatment well   Behavior During Therapy [facility] for tasks assessed/performed      Past Medical History  Diagnosis Date  . Thyroid disease   . Cancer ([place])   . 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 Testosterone deficiency   . Knee pain, bilateral   . History of pituitary tumor     Past Surgical History  Procedure Laterality Date  . Cervical disc surgery  2007    Anterior C4/5 vertebrectomy and c3-C6 laminoplasty with plating  . Craniotomy for tumor      pituitary adenoma    There were no vitals filed for this visit.  Visit Diagnosis:  Dizziness and giddiness  Abnormality of gait      Subjective Assessment - 10/26/14 1611    Subjective Pt denies falls since prior to last PT session. When asked about vertigo, pt replied, "I think it's a little better." Pt/mother report that MD cleared pt to remove hard cervical collar for short times to perform ROM. Cervical collar planned to removed on 12/20/14.   Patient is accompained by: Family member  mother   Pertinent History Cervical precautions. Hard cervical collar at all times. PMH: cervical spondylosis with myelopathy; s/p cervical vertebrectomy C4/5 with C3-6 plating 2007 acromegaly s/p pituitary tumor resection (1994); hypogonadism; Wolff-Parkinson-White Syndrome; cardiomyopathy; acute blood loss  anemia; adjustment disorder with depressed mood   Patient Stated Goals "I'd like to be able to walk by myself with no assistance, stand up out of a chair by myself, be able to get up and down stairs without assistance.   Currently in Pain? Yes   Pain Score 1    Pain Location Neck   Pain Orientation Mid   Pain Descriptors / Indicators Sore   Pain Type Acute pain;Surgical pain   Pain Onset 1 to 4 weeks ago   Pain Frequency Intermittent   Aggravating Factors  eating, malpositioning   Pain Relieving Factors repositioning   Effect of Pain on Daily Activities Pt will monitor but will not directly address cervical spine pain due to nature.   Multiple Pain Sites No                Vestibular Assessment - 10/26/14 0001    Positional Testing   Horizontal Canal Testing Horizontal Canal Right   Horizontal Canal Right   Horizontal Canal Right Duration Persistent   Horizontal Canal Right Symptoms Ageotrophic;Nystagmus   Horizontal Canal Left   Horizontal Canal Left Duration Persistent   Horizontal Canal Left Symptoms Ageotrophic;Nystagmus   Horizontal Canal Right Intensity   Horizontal Canal Right Intensity Moderate   Horizontal Canal Left Intensity   Horizontal Canal Left Intensity Severe                 [facility] Adult PT Treatment/Exercise - 10/26/14 0001  Ambulation/Gait   Ambulation/Gait Yes   Ambulation/Gait Assistance 5: Supervision   Ambulation Distance (Feet) 115 Feet   Assistive device Rolling walker  bariatric   Gait Pattern Step-through pattern;Decreased hip/knee flexion - left;Decreased weight shift to left;Left flexed knee in stance  L Trendelenburg with increased fatigue   Ambulation Surface Level;Indoor         Vestibular Treatment/Exercise - 10/26/14 0001    Vestibular Treatment/Exercise   Vestibular Treatment Provided Canalith Repositioning   Canalith Repositioning Comment   OTHER   Comment Performed R horizontal cupulolithiasis treatment [place]  2012) modified for rotation of body (as opposed to cervical spine rotation) with the following modifications due to cervical precautions, recent cervical/thoracic spinal fusion surgery (09/10/14): to exclude vibration; for rotation of body as opposed to cervical spine. Each position held for 3 minutes. Pt denied nausea throughout maneuver.                PT Education - 10/26/14 1615    Education provided Yes   Education Details Recommending parents continue to provide min guard with all standing/walking until vertiginous symptoms consistently resolved. Horizontal roll habirutation see Pt Instructions.   Person(s) Educated Patient;Parent(s)   Methods Explanation   Comprehension Verbalized understanding          PT Short Term Goals - 10/12/14 2053    PT SHORT TERM GOAL #1   Title Pt will perform initial HEP with mod I using paper handout to indicate safe HEP compliance, maximize functional gains made in PT. Target date: 11/06/14   Baseline Met 9/22.   Status Achieved   PT SHORT TERM GOAL #2   Title Perform 6 Minute Walk Test to assess/address functional endurance. Target date: 11/06/14   Baseline 9/20: 556' with RW   Status Achieved   PT SHORT TERM GOAL #3   Title Perform TUG to assess/address efficiency of functional mobility. Target date: 11/06/14   Baseline 9/20: TUG time = 28.3 seconds with RW   Status Achieved   PT SHORT TERM GOAL #4   Title Pt will ambulate 200' over level, indoor surfaces with mod I using LRAD to indicate increased safety/indepedence with household ambulation. Target date: 11/06/14   PT SHORT TERM GOAL #5   Title Pt will negotiate 14 stairs with B rails and min A of single person to indicate increased independence accessing second floor of home. Target date: 11/06/14           PT Long Term Goals - 10/09/14 2125    PT LONG TERM GOAL #1   Title Pt will improve 6 Minute Walk distance by 150' from baseline to indicate significant improvement in  functional endurance. Target date: 12/04/14   PT LONG TERM GOAL #2   Title Pt will improve TUG score by 10.8 seconds from baseline to indicate significant improvement in efficiency of functional mobility.  Target date: 12/04/14   PT LONG TERM GOAL #3   Title Pt will ambulate 500' over unlevel, paved surfaces with mod I using LRAD to indicate increased stability/independence with community mobility. Target date: 12/04/14   PT LONG TERM GOAL #4   Title Pt will negotiate standard ramp and curb step with mod I using LRAD to indicate increased safety/indepedence traversing community obstacles. Target date: 12/04/14   PT LONG TERM GOAL #5   Title Pt will negotiate 14 stairs with 2 rails and mod I to indicate increased independence accessing second floor of home.  Target date: 12/04/14  Plan - 10/26/14 1656    Clinical Impression Statement Modified supine Roll Test suggested R Horizontal Cupulolithiasis. Performed Cupulolith Repositioning Maneuver Maudie Mercury 2012) modified to adhere to cervical spine precautions and to exclude vibration. Pt tolerated treatment well, reported no nausea, and was able to ambulate x115' without dizziness post-maneuver. Verbal and written instructions provided for horizontal roll habituation. Continue per POC.   Pt will benefit from skilled therapeutic intervention in order to improve on the following deficits Abnormal gait;Decreased activity tolerance;Decreased balance;Decreased endurance;Decreased coordination;Decreased mobility;Decreased strength;Impaired sensation;Pain;Other (comment)  pain will be monitored but not directly addressed due to nature of pain   Rehab Potential Good   PT Frequency Other (comment)  3x/week for 4 weeks followed by 2x/week for susequent 4 weeks   PT Duration Other (comment)  see above   PT Treatment/Interventions Vestibular;ADLs/Self Care Home Management;DME Instruction;Balance training;Therapeutic exercise;Therapeutic  activities;Functional mobility training;Stair training;Gait training;Neuromuscular re-education;Patient/family education   PT Home Exercise Plan Ask about dizziness, falls. Reassess BPPV. Continue to address goals.   Consulted and Agree with Plan of Care Patient;Family member/caregiver   Family Member Consulted mother        Problem List Patient Active Problem List   Diagnosis Date Noted  . Adjustment disorder with depressed mood   . Tetraplegia ([place]) 09/19/2014  . Acute blood loss anemia 09/19/2014  . Cervical spondylosis with myelopathy 09/18/2014  . Radiculopathy of arm 08/28/2014  . Hypogonadism male 09/08/2013  . 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 Cardiomyopathy 04/01/2011  . Wolff-Parkinson-White (WPW) syndrome 04/01/2011    Billie Ruddy, PT, DPT [facility] [address] [place], [place], [address] Phone: [phone]   Fax:  [phone] 10/26/2014, 5:06 PM

## 2014-10-26 NOTE — Patient Instructions (Addendum)
Do not start until 10/27/14.  Horizontal Canal   Sit on bed, head straight forward. Lie slowly on right side. Hold position for 2 minutes. Roll from the right to the left side. Repeat sequence __2__ times per session. Do __2__ sessions per day.

## 2014-10-26 NOTE — Telephone Encounter (Signed)
Called his mom [facility] and [facility]- an enema sounds like a good idea. OW I do not understand message I'm afraid; not sure if it means they want me to rx Elavil or if a Dr. Domingo Cocking is doing this.  Let me know if I can help

## 2014-10-26 NOTE — Telephone Encounter (Signed)
Patient has not had a BM in five days, will do an enema today.   Mother Stanton [facility]ney [phone]  Saw neurosurgeon at [facility] - patient is depressed.  Put on elivel in the past and it worked well and wants more for a brief period of time.  Dr. Domingo Cocking.    Dr. Lorelei Pont

## 2014-10-26 NOTE — Therapy (Signed)
[facility] [address] [place], [place], [address] Phone: [phone]   Fax:  [phone]  Occupational Therapy Treatment  Patient Details  Name: Mark Gallagher MRN: [id number] Date of Birth: Jun 25, 1970 Referring Provider:  Leandrew Koyanagi, MD  Encounter Date: 10/26/2014      OT End of Session - 10/26/14 1614    Visit Number 6   Number of Visits 17   Date for OT Re-Evaluation 12/08/14   Authorization Type A[employer], 60 visit limit combined   OT Start Time 1535   OT Stop Time 1615   OT Time Calculation (min) 40 min   Activity Tolerance Patient tolerated treatment well   Behavior During Therapy [facility] for tasks assessed/performed      Past Medical History  Diagnosis Date  . Thyroid disease   . Cancer ([place])   . 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 Testosterone deficiency   . Knee pain, bilateral   . History of pituitary tumor     Past Surgical History  Procedure Laterality Date  . Cervical disc surgery  2007    Anterior C4/5 vertebrectomy and c3-C6 laminoplasty with plating  . Craniotomy for tumor      pituitary adenoma    There were no vitals filed for this visit.  Visit Diagnosis:  Muscle weakness (generalized)  Decreased coordination  Left hand weakness      Subjective Assessment - 10/26/14 1537    Patient is accompained by: Family member   Pertinent History C3-T2 fusion 09/10/14 with cardiac surgery after surgery, hx of previous C1-C6 fusion (2007)   Patient Stated Goals return to work   Currently in Pain? Yes   Pain Score 1    Pain Location Neck   Pain Type Acute pain;Surgical pain   Pain Onset 1 to 4 weeks ago   Pain Frequency Intermittent   Aggravating Factors  malpositioning   Pain Relieving Factors reposistioning   Multiple Pain Sites No       Treatment: Putty exercises for grip and pinch, pulling small pegs from putty for increased coordination LUE, min v.c. Gripper set at 25 lbs for 1/2  blocks and 35 lbs for 1/2 blocks to pick up with sustained grip Handwriting/ tracing activity for increased hand / eye coordination with foam grip, min v.c./ difficulty Standing to remove graded clothespins form vertical antennae with LUE for sustained pinch/ functional midrange reach, min v.c. for weightshift to left side Arm bike x 6 mins no resistance for conditioning                         OT Short Term Goals - 10/09/14 1556    OT SHORT TERM GOAL #1   Title Pt will verbalize understanding of coordination/hand strength HEP--check STGs 11/08/14   Time 4   Period Weeks   Status New   OT SHORT TERM GOAL #2   Title Pt will perfom BADLs mod I.   Time 4   Period Weeks   Status New   OT SHORT TERM GOAL #3   Title Pt will improve L hand coordination for ADLs as shown by completing 9-hole peg test in less than 55sec.   Baseline 77.34sec   Time 4   Period Weeks   Status New   OT SHORT TERM GOAL #4   Title Pt will demo at least 40lbs L grip strength for ADLs/opening containers.   Baseline 30lbs   Time 4   Period Weeks  Status New           OT Long Term Goals - 10/09/14 1558    OT LONG TERM GOAL #1   Title Pt will be independent with proximal UE HEP when cleared from cervical/lifting precautions.--check LTGs 12/09/14   Time 8   Period Weeks   Status New   OT LONG TERM GOAL #2   Title Pt will perform simple meal prep/home maintenance tasks mod I.   Time 8   Period Weeks   Status New   OT LONG TERM GOAL #3   Title Pt will improve L hand coordination for ADLs as shown by completing 9-hole peg test in less than 45sec.   Baseline 77.34sec   Time 8   Period Weeks   Status New   OT LONG TERM GOAL #4   Title Pt will demo at least 55lbs L grip strength for ADLs/opening containers.   Baseline 30lbs   Time 8   Period Weeks   Status New               Plan - 10/26/14 1605    Clinical Impression Statement Pt is progressing towards goals for LUE  strength and coordination. Pt saw MD and he reports MD said he could remove his neck collar some.   Pt will benefit from skilled therapeutic intervention in order to improve on the following deficits (Retired) Decreased activity tolerance;Decreased balance;Decreased mobility;Decreased strength;Pain;Impaired UE functional use;Decreased knowledge of use of DME;Decreased coordination   Rehab Potential Good   OT Frequency 2x / week   OT Duration 8 weeks   OT Treatment/Interventions Passive range of motion;Moist Heat;Cryotherapy;DME and/or AE instruction;Therapeutic activities;Energy conservation;Therapeutic exercises;Neuromuscular education;Manual Therapy;Self-care/ADL training;Therapeutic exercise;Functional Mobility Training;Patient/family education   Plan coordiantion, hand strengthening, UBE no resistance   Consulted and Agree with Plan of Care Patient;Family member/caregiver   Family Member Consulted mother        Problem List Patient Active Problem List   Diagnosis Date Noted  . Adjustment disorder with depressed mood   . Tetraplegia ([place]) 09/19/2014  . Acute blood loss anemia 09/19/2014  . Cervical spondylosis with myelopathy 09/18/2014  . Radiculopathy of arm 08/28/2014  . Hypogonadism male 09/08/2013  . 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 Cardiomyopathy 04/01/2011  . Wolff-Parkinson-White (WPW) syndrome 04/01/2011    RINE,KATHRYN 10/26/2014, 4:17 PM Theone Murdoch, OTR/L [phone] Phone: [phone] 4:17 PM 10/26/2014 [facility] [address] [place], [place], [address] Phone: [phone]   Fax:  [phone]

## 2014-10-26 NOTE — Telephone Encounter (Signed)
Mother would like Dr. Lorelei Pont to call in the prescription.    [facility] [address]   Wants to know if they have to continue the enema on a regular basis.  Unless you have a better idea.  [phone]  Medication is causing constipation.

## 2014-10-27 ENCOUNTER — Ambulatory Visit: Payer: Managed Care, Other (non HMO) | Admitting: Physical Therapy

## 2014-10-27 DIAGNOSIS — M6281 Muscle weakness (generalized): Secondary | ICD-10-CM

## 2014-10-27 DIAGNOSIS — R42 Dizziness and giddiness: Secondary | ICD-10-CM | POA: Diagnosis not present

## 2014-10-27 DIAGNOSIS — R269 Unspecified abnormalities of gait and mobility: Secondary | ICD-10-CM

## 2014-10-27 MED ORDER — AMITRIPTYLINE HCL 25 MG PO TABS: 25.0000 mg | ORAL_TABLET | Freq: Every day | ORAL | Status: DC

## 2014-10-27 NOTE — Patient Instructions (Signed)
Tip Card 1.The goal of habituation training is to assist in decreasing symptoms of vertigo, dizziness, or nausea provoked by specific head and body motions. 2.These exercises may initially increase symptoms; however, be persistent and work through symptoms. With repetition and time, the exercises will assist in reducing or eliminating symptoms. 3.Exercises should be stopped and discussed with the therapist if you experience any of the following: - Sudden change or fluctuation in hearing - New onset of ringing in the ears, or increase in current intensity - Any fluid discharge from the ear - Severe pain in neck or back - Extreme nausea  Copyright  VHI. All rights reserved.  Rolling   With pillow under head, start on back. Roll to your right side.  Hold until dizziness stops, plus 20 seconds and then roll to the left side.  Hold until dizziness stops, plus 20 seconds.  Repeat sequence 5 times per session. Do 2 sessions per day.       

## 2014-10-27 NOTE — Telephone Encounter (Signed)
Called but was not able to reach her. LMOM. I think taking amitriptyline should be ok.  However because he has had some issues with his heart that are not clear to me (his cardiologist is at [facility]) I would recommend that they make sure this ok prior to starting the medication.  I'm afraid that I do not have any great ideas about the constipation I wish there was more that I could do to help

## 2014-10-28 NOTE — Therapy (Signed)
[facility] [address] [place], [place], [address] Phone: [phone]   Fax:  [phone]  Physical Therapy Treatment  Patient Details  Name: Mark Gallagher MRN: [id number] Date of Birth: 09-Jun-1970 Referring Provider:  Leandrew Koyanagi, MD  Encounter Date: 10/27/2014      PT End of Session - 10/28/14 1545    Visit Number 8   Number of Visits 21   Date for PT Re-Evaluation 12/08/14   Authorization Type Aetna    PT Start Time 1316   PT Stop Time 1407   PT Time Calculation (min) 51 min   Equipment Utilized During Treatment Cervical collar   Activity Tolerance Patient tolerated treatment well;No increased pain   Behavior During Therapy [facility] for tasks assessed/performed      Past Medical History  Diagnosis Date  . Thyroid disease   . Cancer ([place])   . 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 Testosterone deficiency   . Knee pain, bilateral   . History of pituitary tumor     Past Surgical History  Procedure Laterality Date  . Cervical disc surgery  2007    Anterior C4/5 vertebrectomy and c3-C6 laminoplasty with plating  . Craniotomy for tumor      pituitary adenoma    There were no vitals filed for this visit.  Visit Diagnosis:  Dizziness and giddiness  Abnormality of gait  Muscle weakness (generalized)      Subjective Assessment - 10/27/14 1341    Subjective Pt expressing discouragement with slow progress, stating, "I'm like an 44 year old man." When this PT offered information on resources, like couseling, for coping, pt declined. Pt's mother explained that pt planning to start antidepressants soon.  Pt reports having had dizziness for "just a few seconds" when getting in and out of bed last night and this morning.   Patient is accompained by: Family member  mother and father   Pertinent History Cervical precautions. Hard cervical collar at all times. PMH: cervical spondylosis with myelopathy; s/p cervical  vertebrectomy C4/5 with C3-6 plating 2007 acromegaly s/p pituitary tumor resection (1994); hypogonadism; Wolff-Parkinson-White Syndrome; cardiomyopathy; acute blood loss anemia; adjustment disorder with depressed mood   Patient Stated Goals "I'd like to be able to walk by myself with no assistance, stand up out of a chair by myself, be able to get up and down stairs without assistance.   Currently in Pain? Yes   Pain Score 2    Pain Location Neck   Pain Orientation Mid   Pain Descriptors / Indicators Sore   Pain Type Acute pain;Surgical pain   Pain Onset 1 to 4 weeks ago   Pain Frequency Intermittent   Aggravating Factors  malpositioning   Pain Relieving Factors repositioning   Multiple Pain Sites No                Vestibular Assessment - 10/28/14 0001    Vestibular Assessment   General Observation Pt reporing having had dizziness for "only a few seconds" on night of 10/6 and morning of 10/7.   Symptom Behavior   Type of Dizziness Spinning   Positional Testing   Horizontal Canal Testing Horizontal Canal Right   Horizontal Canal Right   Horizontal Canal Right Duration < 3 seconds   Horizontal Canal Right Symptoms Geotrophic;Nystagmus   Horizontal Canal Left   Horizontal Canal Left Duration < 3 seconds   Horizontal Canal Left Symptoms Geotrophic;Nystagmus   Horizontal Canal Right Intensity   Horizontal Canal Right Intensity Mild  Horizontal Canal Left Intensity   Horizontal Canal Left Intensity Mild                 [facility] Adult PT Treatment/Exercise - 10/28/14 0001    Transfers   Transfers Sit to Stand;Stand to Sit   Sit to Stand 5: Supervision;6: Modified independent (Device/Increase time)   Sit to Stand Details (indicate cue type and reason) mod I with RW; (S) with SPC   Stand to Sit 6: Modified independent (Device/Increase time);5: Supervision   Stand to Sit Details Mod I with RW; (S) with SPC   Ambulation/Gait   Ambulation/Gait Yes   Ambulation/Gait  Assistance 4: Min guard;4: Min assist   Ambulation Distance (Feet) 345 Feet   Assistive device Straight cane  bariatric SPC: L AFO    Gait Pattern Step-through pattern;Decreased hip/knee flexion - left;Decreased weight shift to left;Left flexed knee in stance;Poor foot clearance - left  intermittent L toe catch   Ambulation Surface Level;Indoor   Gait Comments Gait x345' with SPC with cueing for sequencing, for L heel strike. Required min guard - min A secondary to intermittent L toe catch.    Self-Care   Self-Care Other Self-Care Comments   Other Self-Care Comments  Per pt/parents questions about vertigo, explained BPPV, treatment, nystagmus. Pt/parents verbalized understanding of all education.   Neuro Re-ed    Neuro Re-ed Details  See vestibular assessment/treatment for details/         Vestibular Treatment/Exercise - 10/28/14 0001    Vestibular Treatment/Exercise   Vestibular Treatment Provided Habituation   OTHER   Comment Explained, demonstrated horizontal canal habituation, explaining that pt should wait 20 seconds after symptoms stop to change position. Pt verbalized understanding and gave effective return demonstration. Paper handout provided to promote carryover.               PT Education - 10/28/14 1534    Education provided Yes   Education Details Horizontal roll habituation (decreased time in each position due to pt presentation today); see Pt Instructions.   Person(s) Educated Patient;Parent(s)   Methods Explanation;Demonstration;Verbal cues;Handout   Comprehension Verbalized understanding;Returned demonstration          PT Short Term Goals - 10/12/14 2053    PT SHORT TERM GOAL #1   Title Pt will perform initial HEP with mod I using paper handout to indicate safe HEP compliance, maximize functional gains made in PT. Target date: 11/06/14   Baseline Met 9/22.   Status Achieved   PT SHORT TERM GOAL #2   Title Perform 6 Minute Walk Test to assess/address  functional endurance. Target date: 11/06/14   Baseline 9/20: 556' with RW   Status Achieved   PT SHORT TERM GOAL #3   Title Perform TUG to assess/address efficiency of functional mobility. Target date: 11/06/14   Baseline 9/20: TUG time = 28.3 seconds with RW   Status Achieved   PT SHORT TERM GOAL #4   Title Pt will ambulate 200' over level, indoor surfaces with mod I using LRAD to indicate increased safety/indepedence with household ambulation. Target date: 11/06/14   PT SHORT TERM GOAL #5   Title Pt will negotiate 14 stairs with B rails and min A of single person to indicate increased independence accessing second floor of home. Target date: 11/06/14           PT Long Term Goals - 10/09/14 2125    PT LONG TERM GOAL #1   Title Pt will improve 6 Minute Walk distance  by 150' from baseline to indicate significant improvement in functional endurance. Target date: 12/04/14   PT LONG TERM GOAL #2   Title Pt will improve TUG score by 10.8 seconds from baseline to indicate significant improvement in efficiency of functional mobility.  Target date: 12/04/14   PT LONG TERM GOAL #3   Title Pt will ambulate 500' over unlevel, paved surfaces with mod I using LRAD to indicate increased stability/independence with community mobility. Target date: 12/04/14   PT LONG TERM GOAL #4   Title Pt will negotiate standard ramp and curb step with mod I using LRAD to indicate increased safety/indepedence traversing community obstacles. Target date: 12/04/14   PT LONG TERM GOAL #5   Title Pt will negotiate 14 stairs with 2 rails and mod I to indicate increased independence accessing second floor of home.  Target date: 12/04/14               Plan - 10/28/14 1549    Clinical Impression Statement Per modified Roll Test, R horziontal cupulolithiasis converted to very mild R horizontal canalithiasis. Provided education on horizontal canal habituation. Pt continues to require min guard-min A for ambulation  with bariatric SPC. Continue per POC.   Pt will benefit from skilled therapeutic intervention in order to improve on the following deficits Abnormal gait;Decreased activity tolerance;Decreased balance;Decreased endurance;Decreased coordination;Decreased mobility;Decreased strength;Impaired sensation;Pain;Other (comment)  pain will be monitored but not directly addressed due to nature of pain   Rehab Potential Good   PT Frequency Other (comment)  3x/week for 4 weeks followed by 2x/week for susequent 4 weeks   PT Duration Other (comment)  see above   PT Treatment/Interventions Vestibular;ADLs/Self Care Home Management;DME Instruction;Balance training;Therapeutic exercise;Therapeutic activities;Functional mobility training;Stair training;Gait training;Neuromuscular re-education;Patient/family education   PT Next Visit Plan Again, ask pt/parents about dizziness/falls. Reassess BPPV.  Progress current HEP. Consider tall kneelng for increased hip strengthening, increased propioceptive input in LLE.   PT Home Exercise Plan Progress current HEP.   Consulted and Agree with Plan of Care Patient;Family member/caregiver   Family Member Consulted mother and father        Problem List Patient Active Problem List   Diagnosis Date Noted  . Adjustment disorder with depressed mood   . Tetraplegia ([place]) 09/19/2014  . Acute blood loss anemia 09/19/2014  . Cervical spondylosis with myelopathy 09/18/2014  . Radiculopathy of arm 08/28/2014  . Hypogonadism male 09/08/2013  . 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 Cardiomyopathy 04/01/2011  . Wolff-Parkinson-White (WPW) syndrome 04/01/2011    Billie Ruddy, PT, DPT [facility] [address] [place], [place], [address] Phone: [phone]   Fax:  [phone] 10/28/2014, 3:52 PM

## 2014-10-30 ENCOUNTER — Encounter: Payer: Self-pay | Admitting: Physical Medicine & Rehabilitation

## 2014-10-30 ENCOUNTER — Ambulatory Visit: Payer: Managed Care, Other (non HMO) | Admitting: Physical Therapy

## 2014-10-30 ENCOUNTER — Encounter
Payer: Managed Care, Other (non HMO) | Attending: Physical Medicine & Rehabilitation | Admitting: Physical Medicine & Rehabilitation

## 2014-10-30 VITALS — BP 124/77 | HR 89

## 2014-10-30 DIAGNOSIS — H8113 Benign paroxysmal vertigo, bilateral: Secondary | ICD-10-CM

## 2014-10-30 DIAGNOSIS — K5900 Constipation, unspecified: Secondary | ICD-10-CM | POA: Diagnosis not present

## 2014-10-30 DIAGNOSIS — R531 Weakness: Secondary | ICD-10-CM | POA: Diagnosis not present

## 2014-10-30 DIAGNOSIS — M25562 Pain in left knee: Secondary | ICD-10-CM | POA: Diagnosis not present

## 2014-10-30 DIAGNOSIS — R202 Paresthesia of skin: Secondary | ICD-10-CM | POA: Diagnosis not present

## 2014-10-30 DIAGNOSIS — E220 Acromegaly and pituitary gigantism: Secondary | ICD-10-CM | POA: Insufficient documentation

## 2014-10-30 DIAGNOSIS — M25561 Pain in right knee: Secondary | ICD-10-CM | POA: Insufficient documentation

## 2014-10-30 DIAGNOSIS — R296 Repeated falls: Secondary | ICD-10-CM | POA: Insufficient documentation

## 2014-10-30 DIAGNOSIS — M25362 Other instability, left knee: Secondary | ICD-10-CM | POA: Insufficient documentation

## 2014-10-30 DIAGNOSIS — C801 Malignant (primary) neoplasm, unspecified: Secondary | ICD-10-CM | POA: Insufficient documentation

## 2014-10-30 DIAGNOSIS — M4804 Spinal stenosis, thoracic region: Secondary | ICD-10-CM | POA: Diagnosis not present

## 2014-10-30 DIAGNOSIS — M4802 Spinal stenosis, cervical region: Secondary | ICD-10-CM | POA: Diagnosis not present

## 2014-10-30 DIAGNOSIS — R42 Dizziness and giddiness: Secondary | ICD-10-CM | POA: Diagnosis not present

## 2014-10-30 DIAGNOSIS — Z79899 Other long term (current) drug therapy: Secondary | ICD-10-CM | POA: Diagnosis not present

## 2014-10-30 DIAGNOSIS — M4712 Other spondylosis with myelopathy, cervical region: Secondary | ICD-10-CM | POA: Diagnosis not present

## 2014-10-30 DIAGNOSIS — R269 Unspecified abnormalities of gait and mobility: Secondary | ICD-10-CM

## 2014-10-30 DIAGNOSIS — M21372 Foot drop, left foot: Secondary | ICD-10-CM | POA: Diagnosis not present

## 2014-10-30 DIAGNOSIS — E079 Disorder of thyroid, unspecified: Secondary | ICD-10-CM | POA: Diagnosis not present

## 2014-10-30 DIAGNOSIS — M6281 Muscle weakness (generalized): Secondary | ICD-10-CM

## 2014-10-30 DIAGNOSIS — R278 Other lack of coordination: Secondary | ICD-10-CM

## 2014-10-30 DIAGNOSIS — R279 Unspecified lack of coordination: Secondary | ICD-10-CM

## 2014-10-30 DIAGNOSIS — D352 Benign neoplasm of pituitary gland: Secondary | ICD-10-CM | POA: Insufficient documentation

## 2014-10-30 NOTE — Progress Notes (Deleted)
   Subjective:    Patient ID: Mark Gallagher, male    DOB: March 05, 1970, 44 y.o.   MRN: [id number]  HPI    Review of Systems     Objective:   Physical Exam        Assessment & Plan:

## 2014-10-30 NOTE — Progress Notes (Signed)
Subjective:    Patient ID: Mark Gallagher, male    DOB: 1970/08/24, 44 y.o.   MRN: [id number]  HPI This is a 44 year old male with history of acromegaly, s/p pituitary adenoma resection, s/p cervical vertebrectomy C4/5 with C3-6 plating 2007 who started developing progressive pain LUE with tingling left arm, weakness BLE with left foot drop and multiple falls. MRI spine revealed cervical canal stenosis C6/7 with T2 signal in adjacent spinal cord. Pt was admitted to [facility] and discharged on 10/05/14.  Since discharge, pt has had dizziness for about 1 week. He saw someone at [facility] was placed on a Holter monitor.  He was having vertigo as well.  However, over the last 2 days he has not any spells.  The results of the Holter monitor have not been recorded.  He notes the dizziness when in particular positions.  He had associated falls. He was given exercises by therapies and notes improvement with therapies.  His parents are providing 24/7 support currently.   He also notes persistent tingling predominately in his 4th and 5th digits of his left hand.  Overall, this has improved.  He continues to have knee instability, which is improved with the brace.  He also continues to left foot drop, which is improved with his AFO.   He continues with 3 days PT and 2 days with OT.   He is using a knee brace, AFO on LLE and rolling walker for ambulation.    Pain Inventory Average Pain 2 Pain Right Now 2 My pain is tingling  In the last 24 hours, has pain interfered with the following? General activity 0 Relation with others 0 Enjoyment of life 0 What TIME of day is your pain at its worst? all Sleep (in general) Fair  Pain is worse with: other Pain improves with: rest Relief from Meds: 8  Mobility use a walker how many minutes can you walk? 10 ability to climb steps?  yes do you drive?  no  Function disabled: date disabled 08/22/2014 I need assistance with the following:  dressing, bathing, toileting,  meal prep, household duties and shopping  Neuro/Psych weakness numbness tingling trouble walking dizziness  Prior Studies Any changes since last visit?  no  Physicians involved in your care Any changes since last visit?  no   Family History  Problem Relation Age of Onset  . High blood pressure Mother   . Acute myelogenous leukemia Brother    Social History   Social History  . Marital Status: Single    Spouse Name: N/A  . Number of Children: N/A  . Years of Education: N/A   Social History Main Topics  . Smoking status: Never Smoker   . Smokeless tobacco: None  . Alcohol Use: No  . Drug Use: No  . Sexual Activity: Not Asked   Other Topics Concern  . None   Social History Narrative   Past Surgical History  Procedure Laterality Date  . Cervical disc surgery  2007    Anterior C4/5 vertebrectomy and c3-C6 laminoplasty with plating  . Craniotomy for tumor      pituitary adenoma   Past Medical History  Diagnosis Date  . Thyroid disease   . Cancer ([place])   . 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 Testosterone deficiency   . Knee pain, bilateral   . History of pituitary tumor    BP 124/77 mmHg  Pulse 89  SpO2 97%  Opioid Risk Score:   Fall Risk Score:  `  1  Depression screen PHQ 2/9  Depression screen [facility] 2/9 10/19/2014 08/23/2014 09/08/2013  Decreased Interest 0 0 0  Down, Depressed, Hopeless 0 0 0  PHQ - 2 Score 0 0 0    Review of Systems  Constitutional: Positive for unexpected weight change.  Gastrointestinal: Positive for constipation.  Musculoskeletal: Positive for gait problem.  Neurological: Positive for dizziness, weakness and numbness.       Tingling  All other systems reviewed and are negative.      Objective:   Physical Exam  Constitutional: He is oriented to person, place, and time. He appears well-developed and well-nourished.  HENT:  Head: Normocephalic.  Right eye deviation  Eyes: Conjunctivae and EOM are normal.  Neck:  Cervical collar in place   Cardiovascular: Normal rate and regular rhythm.   Pulmonary/Chest: Effort normal and breath sounds normal.  Abdominal: Soft. Bowel sounds are normal.  Musculoskeletal: He exhibits no edema.  RUE, RLE 5/5 grossly LUE, 4/5 grossly LLE hip flex 4/5, knee brace and AFO in place Gait: Step to gait  Neurological: He is alert and oriented to person, place, and time.  Sensation intact to light touch throughout Negative Homan's b/l  Skin: Skin is warm and dry.  Psychiatric: He has a normal mood and affect. His behavior is normal.  Vitals reviewed.         Assessment & Plan:  44 y/o male with pmh o facromegaly, s/p pituitary adenoma resection, s/p cervical vertebrectomy C4/5 with C3-6 plating 2007 who started developing progressive pain LUE with tingling left arm, weakness BLE with left foot drop and multiple falls. MRI spine revealed cervical canal stenosis C6/7 with T2 signal in adjacent spinal cord   1. Cervical myelopathy  Gait instability  Cont therapies  Cont braces and rolling walker for safety   2. BPPV  Cont PT, with stress on vestibular rehab being mindful of cervical precautions  Aggressive therapy will have to wait  Pt's symptoms are improving, however will consider medications if symptoms return or become exacerbated  3. Constipation  Cont Miralax daily  Cont Senna 2 tabs  Will add colace 100mg  BID  Pt may also be stopping his Verapamil soon, which may help as well  Will consider further medications if these two changes do not provide result  4. Tingling 4th and 5th digits of LUE  Pt would not like to try medications at present  5. Left knee instability  Cont knee brace  Cont PT/OT  Cont rolling walker  6. Left foot drop  Cont AFO   Cont PT/OT  Pt and family prefer to follow up in 2 months

## 2014-10-30 NOTE — Therapy (Signed)
[facility] [address] [place], [place], [address] Phone: [phone]   Fax:  [phone]  Physical Therapy Treatment  Patient Details  Name: Mark Gallagher MRN: [id number] Date of Birth: 1970-12-28 Referring Provider:  Leandrew Koyanagi, MD  Encounter Date: 10/30/2014      PT End of Session - 10/30/14 1819    Visit Number 9   Number of Visits 21   Date for PT Re-Evaluation 12/08/14   Authorization Type Aetna    PT Start Time 4235   PT Stop Time 1619   PT Time Calculation (min) 44 min   Equipment Utilized During Treatment Cervical collar   Activity Tolerance Patient tolerated treatment well;No increased pain   Behavior During Therapy [facility] for tasks assessed/performed      Past Medical History  Diagnosis Date  . Thyroid disease   . Cancer ([place])   . 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 Testosterone deficiency   . Knee pain, bilateral   . History of pituitary tumor     Past Surgical History  Procedure Laterality Date  . Cervical disc surgery  2007    Anterior C4/5 vertebrectomy and c3-C6 laminoplasty with plating  . Craniotomy for tumor      pituitary adenoma    There were no vitals filed for this visit.  Visit Diagnosis:  Abnormality of gait  Muscle weakness (generalized)  Decreased coordination  Weakness generalized      Subjective Assessment - 10/30/14 1540    Subjective Pt reports no dizziness for the past 2 days. Denies falls. Both parents still helping patient up/down stairs daily. Pt received new hinged L knee orthosis from orthopedist at [facility]. Parents inquiring as to how to put it on.   Patient is accompained by: Family member  mother and father   Pertinent History Cervical precautions. Hard cervical collar at all times. PMH: cervical spondylosis with myelopathy; s/p cervical vertebrectomy C4/5 with C3-6 plating 2007 acromegaly s/p pituitary tumor resection (1994); hypogonadism; Wolff-Parkinson-White  Syndrome; cardiomyopathy; acute blood loss anemia; adjustment disorder with depressed mood   Patient Stated Goals "I'd like to be able to walk by myself with no assistance, stand up out of a chair by myself, be able to get up and down stairs without assistance.   Currently in Pain? Yes   Pain Score 2    Pain Location Neck   Pain Orientation Mid   Pain Descriptors / Indicators Sore   Pain Type Acute pain   Pain Onset 1 to 4 weeks ago   Pain Frequency Intermittent   Aggravating Factors  malpositioning   Pain Relieving Factors repositioning   Effect of Pain on Daily Activities PT will monitor but will not directly address cervical spine pain due to nature.   Multiple Pain Sites No                         [facility] Adult PT Treatment/Exercise - 10/30/14 0001    Transfers   Transfers Sit to Stand;Stand to Sit   Sit to Stand 6: Modified independent (Device/Increase time)   Sit to Stand Details (indicate cue type and reason) with single UE use; unable to perform without UE support   Stand to Sit 6: Modified independent (Device/Increase time)   Ambulation/Gait   Ambulation/Gait Yes   Ambulation/Gait Assistance 6: Modified independent (Device/Increase time)   Ambulation Distance (Feet) 180 Feet   Assistive device Rolling walker  L AFO   Gait Pattern  Step-through pattern;Decreased hip/knee flexion - left;Decreased weight shift to left;Left flexed knee in stance;Poor foot clearance - left  intermittent L toe catch   Ambulation Surface Level;Indoor   Stairs Assistance 4: Min guard   Stairs Assistance Details (indicate cue type and reason) Negotiated 4 stairs x4 consecutive trials with supervision, cueing for sequencing with effective within-session carryover. Ascended forward-facing for 12 stairs, laterally with BUE support on L rail for 4 stairs. Descended all stairs laterally with BUE support at L rail (to simulate home environment).   Stair Management Technique One rail Left;Two  rails;Step to pattern;Sideways;Forwards   Number of Stairs 16   Height of Stairs 6   Self-Care   Self-Care Other Self-Care Comments   Other Self-Care Comments  Explained, demonstrated how to properly don/doff L hinged knee orthosis with effective return demonstration from pt's mother.   Neuro Re-ed    Neuro Re-ed Details  Pt performed the following in tall kneeling without UE support to increase proximal stability, to increase proproprioceptive input in LLE: forward/retro stepping 4 steps x4 reps; sidestepping 3 x8 steps per direction with mirror in front of pt, multimodal cueing for increased attention to LLE position during sidestepping to L side, as pt with poor LLE placement, excessive lateral weight shift to L causing near LOB to L side.   Exercises   Exercises Other Exercises   Other Exercises  Reviewed and progressed all current home exercises, with exception of sit <> stand. See Pt Instructions for all details on exercises, reps, and sets.                PT Education - 10/30/14 1808    Education provided Yes   Education Details HEP progressed. See Pt Instructions. Proper technique for donning/doffing L hinged knee orthosis. Recommended pt negotiate stairs with assist of only 1 parent; educated on technique for guarding.   Person(s) Educated Patient;Parent(s)   Methods Explanation;Demonstration;Handout   Comprehension Returned demonstration;Verbalized understanding          PT Short Term Goals - 10/30/14 1822    PT SHORT TERM GOAL #1   Title Pt will perform initial HEP with mod I using paper handout to indicate safe HEP compliance, maximize functional gains made in PT. Target date: 11/06/14   Baseline Met 9/22.   Status Achieved   PT SHORT TERM GOAL #2   Title Perform 6 Minute Walk Test to assess/address functional endurance. Target date: 11/06/14   Baseline 9/20: 556' with RW   Status Achieved   PT SHORT TERM GOAL #3   Title Perform TUG to assess/address efficiency  of functional mobility. Target date: 11/06/14   Baseline 9/20: TUG time = 28.3 seconds with RW   Status Achieved   PT SHORT TERM GOAL #4   Title Pt will ambulate 200' over level, indoor surfaces with mod I using LRAD to indicate increased safety/indepedence with household ambulation. Target date: 11/06/14   PT SHORT TERM GOAL #5   Title Pt will negotiate 14 stairs with B rails and min A of single person to indicate increased independence accessing second floor of home. Target date: 11/06/14   Baseline 10/10: required min guard of PT for stairs x16   Status Achieved           PT Long Term Goals - 10/09/14 2125    PT LONG TERM GOAL #1   Title Pt will improve 6 Minute Walk distance by 150' from baseline to indicate significant improvement in functional endurance. Target date: 12/04/14  PT LONG TERM GOAL #2   Title Pt will improve TUG score by 10.8 seconds from baseline to indicate significant improvement in efficiency of functional mobility.  Target date: 12/04/14   PT LONG TERM GOAL #3   Title Pt will ambulate 500' over unlevel, paved surfaces with mod I using LRAD to indicate increased stability/independence with community mobility. Target date: 12/04/14   PT LONG TERM GOAL #4   Title Pt will negotiate standard ramp and curb step with mod I using LRAD to indicate increased safety/indepedence traversing community obstacles. Target date: 12/04/14   PT LONG TERM GOAL #5   Title Pt will negotiate 14 stairs with 2 rails and mod I to indicate increased independence accessing second floor of home.  Target date: 12/04/14               Plan - 10/30/14 1820    Clinical Impression Statement Session focused on education with emphasis on progressing HEP and donning/doffing L knee orthosis. STG met for negotiation of 14 stairs with min A of one person; therefoe, pt will require assist of only one parent when negotiating 14 stairs to access second floor fo home. Continue per POC.    Pt will  benefit from skilled therapeutic intervention in order to improve on the following deficits Abnormal gait;Decreased activity tolerance;Decreased balance;Decreased endurance;Decreased coordination;Decreased mobility;Decreased strength;Impaired sensation;Pain;Other (comment)  pain will be monitored but not directly addressed due to nature of pain   Rehab Potential Good   PT Frequency Other (comment)  3x/week for 4 weeks followed by 2x/week for susequent 4 weeks   PT Duration Other (comment)  see above   PT Treatment/Interventions Vestibular;ADLs/Self Care Home Management;DME Instruction;Balance training;Therapeutic exercise;Therapeutic activities;Functional mobility training;Stair training;Gait training;Neuromuscular re-education;Patient/family education   PT Next Visit Plan Continue to address functional strengthening, proximal stability, decreased LLE proprioception.   PT Home Exercise Plan --   Consulted and Agree with Plan of Care Patient;Family member/caregiver   Family Member Consulted mother and father        Problem List Patient Active Problem List   Diagnosis Date Noted  . Adjustment disorder with depressed mood   . Tetraplegia ([place]) 09/19/2014  . Acute blood loss anemia 09/19/2014  . Cervical spondylosis with myelopathy 09/18/2014  . Radiculopathy of arm 08/28/2014  . Hypogonadism male 09/08/2013  . 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 Cardiomyopathy 04/01/2011  . Wolff-Parkinson-White (WPW) syndrome 04/01/2011    Billie Ruddy, PT, DPT [facility] [address] [place], [place], [address] Phone: [phone]   Fax:  [phone] 10/30/2014, 6:30 PM

## 2014-10-30 NOTE — Patient Instructions (Addendum)
Knee Extension: Terminal - Standing (Single Leg)   Stand with both hands on rolling walker. LEFT foot should be positioned slightly in front of right foot. Stand up nice and tall. Loop GREEN theraband around the top of your LEFT knee; anchor the other side of the band by placing the knotted ends of the stockinette in the door closure (as we discussed during PT).  Use your glute muscles to straighten out your LEFT knee. * Be sure not to let it go too straight. Relax. Repeat. Perform 15 reps, 3 times per day.    Functional Quadriceps: Sit to Stand   Sit on edge of you 23 inch bed with your rolling walker in front of you (just in case). Stand up SLOWLY using one hand only. Try to avoid pushing the back fo your knees against the bed to stand. Then SLOWLY sit back down (without plopping).  Perform 10 reps. 3 times per day.    Strengthening: Hip Abductor - Resisted   Lie down flat on back and loop GREEN band around both legs above knees. Push BOTH thighs outward.Then, SLOWLY return to starting position. Repeat __20__ times per set. Do __3__ sets per day.

## 2014-11-01 ENCOUNTER — Ambulatory Visit: Payer: Managed Care, Other (non HMO) | Admitting: Physical Therapy

## 2014-11-01 ENCOUNTER — Ambulatory Visit: Payer: Managed Care, Other (non HMO) | Admitting: Occupational Therapy

## 2014-11-01 DIAGNOSIS — R42 Dizziness and giddiness: Secondary | ICD-10-CM | POA: Diagnosis not present

## 2014-11-01 DIAGNOSIS — M6281 Muscle weakness (generalized): Secondary | ICD-10-CM

## 2014-11-01 DIAGNOSIS — R279 Unspecified lack of coordination: Secondary | ICD-10-CM

## 2014-11-01 DIAGNOSIS — R269 Unspecified abnormalities of gait and mobility: Secondary | ICD-10-CM

## 2014-11-01 DIAGNOSIS — R531 Weakness: Secondary | ICD-10-CM

## 2014-11-01 DIAGNOSIS — R278 Other lack of coordination: Secondary | ICD-10-CM

## 2014-11-01 NOTE — Therapy (Signed)
[facility] [address] [place], [place], [address] Phone: [phone]   Fax:  [phone]  Occupational Therapy Treatment  Patient Details  Name: Mark Gallagher MRN: [id number] Date of Birth: [birth year] Referring Provider:  Leandrew Koyanagi, MD  Encounter Date: 11/01/2014      OT End of Session - 11/01/14 1657    Visit Number 7   Number of Visits 17   Date for OT Re-Evaluation 12/08/14   Authorization Type Aetna, 60 visit limit combined   OT Start Time 1622   OT Stop Time 1700   OT Time Calculation (min) 38 min   Activity Tolerance Patient tolerated treatment well   Behavior During Therapy [facility] for tasks assessed/performed      Past Medical History  Diagnosis Date  . Thyroid disease   . Cancer ([place])   . 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 Testosterone deficiency   . Knee pain, bilateral   . History of pituitary tumor     Past Surgical History  Procedure Laterality Date  . Cervical disc surgery  2007    Anterior C4/5 vertebrectomy and c3-C6 laminoplasty with plating  . Craniotomy for tumor      pituitary adenoma    There were no vitals filed for this visit.  Visit Diagnosis:  Muscle weakness (generalized)  Decreased coordination      Subjective Assessment - 11/01/14 1630    Subjective  pt reports he is removing neck brace some   Pertinent History C3-T2 fusion 09/10/14 with cardiac surgery after surgery, hx of previous C1-C6 fusion (2007)   Patient Stated Goals return to work   Currently in Pain? Yes   Pain Score 2    Pain Location Neck   Pain Descriptors / Indicators Sore   Pain Type Acute pain   Pain Onset 1 to 4 weeks ago   Pain Frequency Intermittent   Aggravating Factors  malpositioning   Pain Relieving Factors repositioning   Multiple Pain Sites No         Treatment: Arm bike x 6 mins level 1 for conditioning. Gripper set at level 3 for picking up 1 inch blocks several rest breaks required  due to fatigue. Standing to perform functional mid range reaching to place graded clothespins on vertical antennae with LUE for sustained pinch.  Therapist  recommended pt use long handled sponge for washing his back as pt reports his father is washing his back for him. Pt reports he will try at home. Handwriting activity, min v.c., Pt demonstrates grossly 80% legibility due to fatigue. Therapist provided pt with built up grip for his pen at home.                        OT Short Term Goals - 11/01/14 1640    OT SHORT TERM GOAL #1   Title Pt will verbalize understanding of coordination/hand strength HEP--check STGs 11/08/14   Time 4   Period Weeks   Status On-going   OT SHORT TERM GOAL #2   Title Pt will perfom BADLs mod I.   Baseline min A bathing   Time 4   Period Weeks   Status On-going   OT SHORT TERM GOAL #3   Title Pt will improve L hand coordination for ADLs as shown by completing 9-hole peg test in less than 55sec.   Baseline 77.34sec   Time 4   Period Weeks   Status New   OT SHORT  TERM GOAL #4   Title Pt will demo at least 40lbs L grip strength for ADLs/opening containers.   Baseline 30lbs   Time 4   Period Weeks   Status New           OT Long Term Goals - 10/09/14 1558    OT LONG TERM GOAL #1   Title Pt will be independent with proximal UE HEP when cleared from cervical/lifting precautions.--check LTGs 12/09/14   Time 8   Period Weeks   Status New   OT LONG TERM GOAL #2   Title Pt will perform simple meal prep/home maintenance tasks mod I.   Time 8   Period Weeks   Status New   OT LONG TERM GOAL #3   Title Pt will improve L hand coordination for ADLs as shown by completing 9-hole peg test in less than 45sec.   Baseline 77.34sec   Time 8   Period Weeks   Status New   OT LONG TERM GOAL #4   Title Pt will demo at least 55lbs L grip strength for ADLs/opening containers.   Baseline 30lbs   Time 8   Period Weeks   Status New                Plan - 11/01/14 1638    Clinical Impression Statement Pt is progressing towards goals for LUE strength and endurance   Pt will benefit from skilled therapeutic intervention in order to improve on the following deficits (Retired) Decreased activity tolerance;Decreased balance;Decreased mobility;Decreased strength;Pain;Impaired UE functional use;Decreased knowledge of use of DME;Decreased coordination   Rehab Potential Good   OT Frequency 2x / week   OT Duration 8 weeks   OT Treatment/Interventions Passive range of motion;Moist Heat;Cryotherapy;DME and/or AE instruction;Therapeutic activities;Energy conservation;Therapeutic exercises;Neuromuscular education;Manual Therapy;Self-care/ADL training;Therapeutic exercise;Functional Mobility Training;Patient/family education   Plan coordination, hand strength, endurance   OT Home Exercise Plan issued coordination 10/12/14   Consulted and Agree with Plan of Care Patient;Family member/caregiver   Family Member Consulted mother        Problem List Patient Active Problem List   Diagnosis Date Noted  . Adjustment disorder with depressed mood   . Tetraplegia ([place]) 09/19/2014  . Acute blood loss anemia 09/19/2014  . Cervical spondylosis with myelopathy 09/18/2014  . Radiculopathy of arm 08/28/2014  . Hypogonadism male 09/08/2013  . 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 Cardiomyopathy 04/01/2011  . Wolff-Parkinson-White (WPW) syndrome 04/01/2011    Lylliana Kitamura 11/01/2014, 5:25 PM Theone Murdoch, OTR/L [phone] Phone: [phone] 5:25 PM 11/01/2014 [facility] [address] [place], [place], [address] Phone: [phone]   Fax:  [phone]

## 2014-11-01 NOTE — Patient Instructions (Addendum)
Knee Extension: Terminal - Standing (Single Leg)   Stand with both hands on rolling walker. LEFT foot should be positioned slightly in front of right foot. Stand up nice and tall. Loop GREEN theraband around the top of your LEFT knee; anchor the other side of the band by placing the knotted ends of the stockinette in the door closure (as we discussed during PT).  The band should be pulling from behind your knee.  Use your glute muscles to straighten out your LEFT knee. * Be sure not to let it go too straight. Relax. Repeat. Perform 20 reps, 3 times per day.

## 2014-11-01 NOTE — Therapy (Signed)
[facility] [address] [place], [place], [address] Phone: [phone]   Fax:  [phone]  Physical Therapy Treatment  Patient Details  Name: Mark Gallagher MRN: [id number] Date of Birth: 24-Jul-1970 Referring Provider:  Leandrew Koyanagi, MD  Encounter Date: 11/01/2014      PT End of Session - 11/01/14 1659    Visit Number 10   Number of Visits 21   Date for PT Re-Evaluation 12/08/14   Authorization Type Aetna    PT Start Time 0254   PT Stop Time 2706   PT Time Calculation (min) 38 min   Equipment Utilized During Treatment Cervical collar   Activity Tolerance Patient tolerated treatment well;No increased pain   Behavior During Therapy [facility] for tasks assessed/performed      Past Medical History  Diagnosis Date  . Thyroid disease   . Cancer ([place])   . 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 Testosterone deficiency   . Knee pain, bilateral   . History of pituitary tumor     Past Surgical History  Procedure Laterality Date  . Cervical disc surgery  2007    Anterior C4/5 vertebrectomy and c3-C6 laminoplasty with plating  . Craniotomy for tumor      pituitary adenoma    There were no vitals filed for this visit.  Visit Diagnosis:  Abnormality of gait  Muscle weakness (generalized)  Weakness generalized      Subjective Assessment - 11/01/14 1544    Subjective Pt reports that dizziness has not returned for several days. Pt/mother report no falls. Mother also reports pt has been going up/down the stairs with one person. Pt reports feeling "a little tired and out of it today." Asking for clarification on knee exercise.   Patient is accompained by: Family member   Pertinent History Cervical precautions. Hard cervical collar at all times. PMH: cervical spondylosis with myelopathy; s/p cervical vertebrectomy C4/5 with C3-6 plating 2007 acromegaly s/p pituitary tumor resection (1994); hypogonadism; Wolff-Parkinson-White  Syndrome; cardiomyopathy; acute blood loss anemia; adjustment disorder with depressed mood   Patient Stated Goals "I'd like to be able to walk by myself with no assistance, stand up out of a chair by myself, be able to get up and down stairs without assistance.   Currently in Pain? Yes   Pain Score 2    Pain Location Neck   Pain Orientation Mid   Pain Descriptors / Indicators Sore   Pain Type Acute pain   Pain Onset 1 to 4 weeks ago   Pain Frequency Intermittent   Aggravating Factors  malpositioning   Pain Relieving Factors repositioning   Effect of Pain on Daily Activities PT will monitor but will not directly address pain due to nature.   Multiple Pain Sites No                         [facility] Adult PT Treatment/Exercise - 11/01/14 0001    Transfers   Transfers Sit to Stand;Stand to Sit   Sit to Stand 6: Modified independent (Device/Increase time);4: Min assist   Sit to Stand Details (indicate cue type and reason) to bariatric RW; required min A for sit > stand from low chair.   Stand to Sit 6: Modified independent (Device/Increase time)   Ambulation/Gait   Ambulation/Gait Yes   Ambulation/Gait Assistance 6: Modified independent (Device/Increase time)   Ambulation/Gait Assistance Details x115' with bariatric [facility] with supervision to min guard. x200' with bariatric RW with  mod I   Ambulation Distance (Feet) 315 Feet   Assistive device Rolling walker;Large base quad cane  L AFO   Gait Pattern Step-through pattern;Decreased hip/knee flexion - left;Decreased weight shift to left;Left flexed knee in stance;Poor foot clearance - left  intermittent L toe catch   Ambulation Surface Level;Indoor   Stairs Assistance 4: Min guard   Stair Management Technique One rail Left;Two rails;Step to pattern;Sideways;Forwards   Number of Stairs 16   Height of Stairs 6   Standardized Balance Assessment   Standardized Balance Assessment Timed Up and Go Test   Timed Up and Go Test   TUG  Normal TUG   Normal TUG (seconds) 22.72  with RW   Self-Care   Self-Care --   Other Self-Care Comments  --   Neuro Re-ed    Neuro Re-ed Details  --   Exercises   Exercises Other Exercises   Other Exercises  Per to request, reviewed L TKE resisted by green Tband; increased reps to 20; see Pt Instructions. Cueing focused on ensuring L knee flexion/extension rather than AP weight shifting with effective within-session carryover. Reminded pt to remove L AFO during exericse.                PT Education - 11/01/14 1646    Education provided Yes   Education Details TKE home exercise progressed. Discussed stopping habituation, as vertigo resolved. Discussed bariatric LBQC for household mobility.   Person(s) Educated Patient;Parent(s)   Methods Explanation;Demonstration;Tactile cues;Verbal cues;Handout   Comprehension Verbalized understanding;Returned demonstration          PT Short Term Goals - 11/01/14 1657    PT SHORT TERM GOAL #1   Title Pt will perform initial HEP with mod I using paper handout to indicate safe HEP compliance, maximize functional gains made in PT. Target date: 11/06/14   Baseline Met 9/22.   Status Achieved   PT SHORT TERM GOAL #2   Title Perform 6 Minute Walk Test to assess/address functional endurance. Target date: 11/06/14   Baseline 9/20: 556' with RW   Status Achieved   PT SHORT TERM GOAL #3   Title Perform TUG to assess/address efficiency of functional mobility. Target date: 11/06/14   Baseline 9/20: TUG time = 28.3 seconds with RW   Status Achieved   PT SHORT TERM GOAL #4   Title Pt will ambulate 200' over level, indoor surfaces with mod I using LRAD to indicate increased safety/indepedence with household ambulation. Target date: 11/06/14   Baseline Met 10/12 with bariatric RW.   Status Achieved   PT SHORT TERM GOAL #5   Title Pt will negotiate 14 stairs with B rails and min A of single person to indicate increased independence accessing second  floor of home. Target date: 11/06/14   Baseline 10/10: required min guard of PT for stairs x16   Status Achieved           PT Long Term Goals - 10/09/14 2125    PT LONG TERM GOAL #1   Title Pt will improve 6 Minute Walk distance by 150' from baseline to indicate significant improvement in functional endurance. Target date: 12/04/14   PT LONG TERM GOAL #2   Title Pt will improve TUG score by 10.8 seconds from baseline to indicate significant improvement in efficiency of functional mobility.  Target date: 12/04/14   PT LONG TERM GOAL #3   Title Pt will ambulate 500' over unlevel, paved surfaces with mod I using LRAD to indicate increased stability/independence  with community mobility. Target date: 12/04/14   PT LONG TERM GOAL #4   Title Pt will negotiate standard ramp and curb step with mod I using LRAD to indicate increased safety/indepedence traversing community obstacles. Target date: 12/04/14   PT LONG TERM GOAL #5   Title Pt will negotiate 14 stairs with 2 rails and mod I to indicate increased independence accessing second floor of home.  Target date: 12/04/14               Plan - 11/01/14 1709    Clinical Impression Statement 5 of 5 STG's met, suggesting impovement in stability/independence with ambulation, stair negotiation, and safety/compliance with HEP. Pt required supervision to min guard with for gait withLBQC during this session. Continue per POC.   Pt will benefit from skilled therapeutic intervention in order to improve on the following deficits Abnormal gait;Decreased activity tolerance;Decreased balance;Decreased endurance;Decreased coordination;Decreased mobility;Decreased strength;Impaired sensation;Pain;Other (comment)  pain will be monitored but not directly addressed due to nature of pain   Rehab Potential Good   PT Frequency Other (comment)  3x/week for 4 weeks followed by 2x/week for susequent 4 weeks   PT Duration Other (comment)  see above   PT  Treatment/Interventions Vestibular;ADLs/Self Care Home Management;DME Instruction;Balance training;Therapeutic exercise;Therapeutic activities;Functional mobility training;Stair training;Gait training;Neuromuscular re-education;Patient/family education   PT Next Visit Plan Continue gait training with bariatric LBQC. Check to see if MD placed order for [facility].   Consulted and Agree with Plan of Care Patient;Family member/caregiver   Family Member Consulted mother        Problem List Patient Active Problem List   Diagnosis Date Noted  . Adjustment disorder with depressed mood   . Tetraplegia ([place]) 09/19/2014  . Acute blood loss anemia 09/19/2014  . Cervical spondylosis with myelopathy 09/18/2014  . Radiculopathy of arm 08/28/2014  . Hypogonadism male 09/08/2013  . 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 Cardiomyopathy 04/01/2011  . Wolff-Parkinson-White (WPW) syndrome 04/01/2011    Billie Ruddy, PT, DPT [facility] [address] [place], [place], [address] Phone: [phone]   Fax:  [phone] 11/01/2014, 5:11 PM

## 2014-11-02 ENCOUNTER — Ambulatory Visit (INDEPENDENT_AMBULATORY_CARE_PROVIDER_SITE_OTHER): Payer: Managed Care, Other (non HMO) | Admitting: *Deleted

## 2014-11-02 ENCOUNTER — Telehealth: Payer: Self-pay | Admitting: Physical Therapy

## 2014-11-02 VITALS — BP 110/82 | HR 76 | Temp 98.1°F

## 2014-11-02 DIAGNOSIS — Z23 Encounter for immunization: Secondary | ICD-10-CM

## 2014-11-02 DIAGNOSIS — E291 Testicular hypofunction: Secondary | ICD-10-CM

## 2014-11-02 DIAGNOSIS — E220 Acromegaly and pituitary gigantism: Secondary | ICD-10-CM

## 2014-11-02 MED ORDER — OCTREOTIDE ACETATE 30 MG IM KIT
30.0000 mg | PACK | Freq: Once | INTRAMUSCULAR | Status: AC
  Administered 2014-11-02: 30 mg via INTRAMUSCULAR

## 2014-11-02 NOTE — Telephone Encounter (Signed)
Dr. Posey Pronto,  Mr. Dismore has been utilizing a bariatric large base quad cane during outpatient PT/OT sessions. Pt would benefit from a personal quad cane to enable pt to begin to progress to ambulating household distances with assistance of parents using cane.  If you agree, please submit an order for a bariatric large base quad cane.  Thank you,  Billie Ruddy, PT, DPT [facility] [address] [place], [place], [address] Phone: [phone]   Fax:  [phone] 11/02/2014, 8:36 AM

## 2014-11-02 NOTE — Progress Notes (Signed)
   Subjective:    Patient ID: Mark Gallagher, male    DOB: 1970-08-03, 44 y.o.   MRN: [id number]  HPI  Pt in for injections only. Sandostatin LAR 30 MG KT., pt also here for Testosterone Cypionate Injection 200mg /ml  and Flu injection.    Review of Systems     Objective:   Physical Exam        Assessment & Plan:

## 2014-11-03 ENCOUNTER — Ambulatory Visit: Payer: Managed Care, Other (non HMO) | Admitting: Rehabilitative and Restorative Service Providers"

## 2014-11-03 ENCOUNTER — Ambulatory Visit: Payer: Managed Care, Other (non HMO) | Admitting: *Deleted

## 2014-11-03 ENCOUNTER — Telehealth: Payer: Self-pay | Admitting: Physical Medicine & Rehabilitation

## 2014-11-03 DIAGNOSIS — R42 Dizziness and giddiness: Secondary | ICD-10-CM | POA: Diagnosis not present

## 2014-11-03 DIAGNOSIS — R269 Unspecified abnormalities of gait and mobility: Secondary | ICD-10-CM

## 2014-11-03 DIAGNOSIS — M6281 Muscle weakness (generalized): Secondary | ICD-10-CM

## 2014-11-03 DIAGNOSIS — R278 Other lack of coordination: Secondary | ICD-10-CM

## 2014-11-03 DIAGNOSIS — R279 Unspecified lack of coordination: Principal | ICD-10-CM

## 2014-11-03 DIAGNOSIS — R29898 Other symptoms and signs involving the musculoskeletal system: Secondary | ICD-10-CM

## 2014-11-03 DIAGNOSIS — R531 Weakness: Secondary | ICD-10-CM

## 2014-11-03 NOTE — Therapy (Signed)
[facility] [address] [place], [place], [address] Phone: [phone]   Fax:  [phone]  Physical Therapy Treatment  Patient Details  Name: Mark Gallagher MRN: [id number] Date of Birth: May 30, 1970 No Data Recorded  Encounter Date: 11/03/2014      PT End of Session - 11/03/14 1603    Visit Number 11   Number of Visits 21   Date for PT Re-Evaluation 12/08/14   Authorization Type Aetna    PT Start Time 1194   PT Stop Time 1535   PT Time Calculation (min) 40 min   Equipment Utilized During Treatment Cervical collar;Gait belt   Activity Tolerance Patient tolerated treatment well;No increased pain   Behavior During Therapy [facility] for tasks assessed/performed      Past Medical History  Diagnosis Date  . Thyroid disease   . Cancer ([place])   . 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 Testosterone deficiency   . Knee pain, bilateral   . History of pituitary tumor     Past Surgical History  Procedure Laterality Date  . Cervical disc surgery  2007    Anterior C4/5 vertebrectomy and c3-C6 laminoplasty with plating  . Craniotomy for tumor      pituitary adenoma    There were no vitals filed for this visit.  Visit Diagnosis:  Abnormality of gait  Weakness generalized      Subjective Assessment - 11/03/14 1451    Subjective The patient reports feeling fatigue this week.  His mother reports finding a tall large based quad cane at [facility].  PT provided the patient's mother with printout of physician order for quad cane and she plans to pick it up this afternoon.   Patient is accompained by: Family member   Pertinent History Cervical precautions. Hard cervical collar at all times. PMH: cervical spondylosis with myelopathy; s/p cervical vertebrectomy C4/5 with C3-6 plating 2007 acromegaly s/p pituitary tumor resection (1994); hypogonadism; Wolff-Parkinson-White Syndrome; cardiomyopathy; acute blood loss anemia; adjustment  disorder with depressed mood   Patient Stated Goals "I'd like to be able to walk by myself with no assistance, stand up out of a chair by myself, be able to get up and down stairs without assistance.   Currently in Pain? Yes   Pain Score 2    Pain Location Neck   Pain Orientation Mid   Pain Descriptors / Indicators Sore   Pain Type Acute pain   Pain Onset 1 to 4 weeks ago   Pain Frequency Intermittent          [facility] Adult PT Treatment/Exercise - 11/03/14 1552    Ambulation/Gait   Ambulation/Gait Yes   Ambulation/Gait Assistance 4: Min guard   Ambulation/Gait Assistance Details Patient initially required close sup to CGA with LBQC, at end of session when fatigued pt requires min guard with L knee increased flexion during stance phase     Ambulation Distance (Feet) 345 Feet  ft with [facility] and then 115 ft with [facility]   Assistive device Large base quad cane   Gait Pattern Step-through pattern;Decreased hip/knee flexion - left;Decreased weight shift to left;Left flexed knee in stance;Poor foot clearance - left  intermittent L toe catch   Ambulation Surface Level   Self-Care   Self-Care Other Self-Care Comments   Other Self-Care Comments  Patient inquired about sleeping positions, PT reviewed UE placement when in R sidelying    Neuro Re-ed    Neuro Re-ed Details  Standing side-stepping with UE decreasing UE support  with progression in parallel bars with min A x 10 feet x 4 reps each direction, lateral weight shift with return to midline with min A with multiple losses of balance requiring min A to recover, marching in place with R UE support x 10 reps each leg, weight shifting in stride position for hip strategy and balance control with CGA to min A for balance/support.  Seated on physioball with lateral and ant/post weight shifting, attempted march with hold x 2 sec R and L sides with min A while sitting on physioball.   Exercises   Exercises Other Exercises   Other Exercises  Seated  hamstring stretching, standing mini squats x 10 reps with UE support and L knee guidance, seated marching with cues on posture and core contraction 10 times R and L, sit<>stand x 10 reps without UE support focusing on stnading posture upon rising.           PT Education - 11/03/14 1601    Education provided Yes   Education Details Provided patient's family printed MD order for [facility].  REcommended he only use for household mobility when family member providing contact guard assist.  Discussed benefits of using a gait belt for safety.   Person(s) Educated Patient;Parent(s)   Methods Explanation;Demonstration   Comprehension Verbalized understanding          PT Short Term Goals - 11/01/14 1657    PT SHORT TERM GOAL #1   Title Pt will perform initial HEP with mod I using paper handout to indicate safe HEP compliance, maximize functional gains made in PT. Target date: 11/06/14   Baseline Met 9/22.   Status Achieved   PT SHORT TERM GOAL #2   Title Perform 6 Minute Walk Test to assess/address functional endurance. Target date: 11/06/14   Baseline 9/20: 556' with RW   Status Achieved   PT SHORT TERM GOAL #3   Title Perform TUG to assess/address efficiency of functional mobility. Target date: 11/06/14   Baseline 9/20: TUG time = 28.3 seconds with RW   Status Achieved   PT SHORT TERM GOAL #4   Title Pt will ambulate 200' over level, indoor surfaces with mod I using LRAD to indicate increased safety/indepedence with household ambulation. Target date: 11/06/14   Baseline Met 10/12 with bariatric RW.   Status Achieved   PT SHORT TERM GOAL #5   Title Pt will negotiate 14 stairs with B rails and min A of single person to indicate increased independence accessing second floor of home. Target date: 11/06/14   Baseline 10/10: required min guard of PT for stairs x16   Status Achieved           PT Long Term Goals - 10/09/14 2125    PT LONG TERM GOAL #1   Title Pt will improve 6 Minute Walk  distance by 150' from baseline to indicate significant improvement in functional endurance. Target date: 12/04/14   PT LONG TERM GOAL #2   Title Pt will improve TUG score by 10.8 seconds from baseline to indicate significant improvement in efficiency of functional mobility.  Target date: 12/04/14   PT LONG TERM GOAL #3   Title Pt will ambulate 500' over unlevel, paved surfaces with mod I using LRAD to indicate increased stability/independence with community mobility. Target date: 12/04/14   PT LONG TERM GOAL #4   Title Pt will negotiate standard ramp and curb step with mod I using LRAD to indicate increased safety/indepedence traversing community obstacles. Target date: 12/04/14  PT LONG TERM GOAL #5   Title Pt will negotiate 14 stairs with 2 rails and mod I to indicate increased independence accessing second floor of home.  Target date: 12/04/14               Plan - 11/03/14 1603    Clinical Impression Statement The patient is progressing gait distance with [facility].  PT continues to recomment caregiver proving CGA due to decreased balance strategies in standing.  Patient demonstrates L LE fatigue after exercise today.   PT Next Visit Plan Continue gait training with bariatric LBQC, balance training, core strengthening   Consulted and Agree with Plan of Care Patient;Family member/caregiver   Family Member Consulted mother        Problem List Patient Active Problem List   Diagnosis Date Noted  . Adjustment disorder with depressed mood   . Tetraplegia ([place]) 09/19/2014  . Acute blood loss anemia 09/19/2014  . Cervical spondylosis with myelopathy 09/18/2014  . Radiculopathy of arm 08/28/2014  . Hypogonadism male 09/08/2013  . 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 Cardiomyopathy 04/01/2011  . Wolff-Parkinson-White (WPW) syndrome 04/01/2011    Ruberta Holck, PT 11/03/2014, 4:05 PM  [facility] [address] [place], [place], [address] Phone: [phone]   Fax:  [phone]  Name: PASCO MARCHITTO MRN: [id number] Date of Birth: Apr 30, 1970

## 2014-11-03 NOTE — Telephone Encounter (Signed)
Therapists believe pt would benefit from a personal bariatric wide based quad cane, which he has been using in therapies.  Will order for pt.

## 2014-11-03 NOTE — Therapy (Signed)
[facility] [address] [place], [place], [address] Phone: [phone]   Fax:  [phone]  Occupational Therapy Treatment  Patient Details  Name: Mark Gallagher MRN: [id number] Date of Birth: 1970-03-15 No Data Recorded  Encounter Date: 11/03/2014      OT End of Session - 11/03/14 1620    Visit Number 8   Number of Visits 17   Date for OT Re-Evaluation 12/08/14   Authorization Type Aetna, 60 visit limit combined   OT Start Time 1410   OT Stop Time 1455   OT Time Calculation (min) 45 min   Activity Tolerance Patient tolerated treatment well   Behavior During Therapy [facility] for tasks assessed/performed      Past Medical History  Diagnosis Date  . Thyroid disease   . Cancer ([place])   . 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 Testosterone deficiency   . Knee pain, bilateral   . History of pituitary tumor     Past Surgical History  Procedure Laterality Date  . Cervical disc surgery  2007    Anterior C4/5 vertebrectomy and c3-C6 laminoplasty with plating  . Craniotomy for tumor      pituitary adenoma    There were no vitals filed for this visit.  Visit Diagnosis:  Decreased coordination  Muscle weakness (generalized)  Left hand weakness      Subjective Assessment - 11/03/14 1417    Subjective  Pt reports pain in neck and still some numbness & tingling in palm/digits 3-5. Pt says he is taking his neck brace off for ~10 minutes a day.   Patient is accompained by: Family member  mom   Pertinent History C3-T2 fusion 09/10/14 with cardiac surgery after surgery, hx of previous C1-C6 fusion (2007)   Patient Stated Goals return to work   Currently in Pain? Yes   Pain Score 2    Pain Location Neck   Pain Orientation Mid   Pain Descriptors / Indicators Sore   Pain Type Acute pain   Pain Onset 1 to 4 weeks ago   Pain Frequency Intermittent   Multiple Pain Sites No        Treatment: Engaged patient in LUE therapeutic  activities in order to increase overall strength and fine/gross motor coordination/control. Patient's 9-hole peg test improved to 62 seconds (77 seconds on eval). Patient's grip strength improved to 35lbs(30lbs on eval). Patient put small pegs in peg board, used tweezers for grooved pegboard, in hand manipulation using pennies, and used 35lb gripper for picking up blocks. Pt eager to regain his strength and function. Discussed cervical precautions with patient, no bending/arching/twisting of neck.                         OT Short Term Goals - 11/03/14 1619    OT SHORT TERM GOAL #1   Title Pt will verbalize understanding of coordination/hand strength HEP--check STGs 11/08/14   Time 4   Period Weeks   Status On-going   OT SHORT TERM GOAL #2   Title Pt will perfom BADLs mod I.   Baseline min A bathing   Time 4   Period Weeks   Status On-going   OT SHORT TERM GOAL #3   Title Pt will improve L hand coordination for ADLs as shown by completing 9-hole peg test in less than 55sec.   Baseline 77.34sec   Time 4   Period Weeks   Status New   OT SHORT  TERM GOAL #4   Title Pt will demo at least 40lbs L grip strength for ADLs/opening containers.   Baseline 30lbs   Time 4   Period Weeks   Status New           OT Long Term Goals - 11/03/14 1620    OT LONG TERM GOAL #1   Title Pt will be independent with proximal UE HEP when cleared from cervical/lifting precautions.--check LTGs 12/09/14   Time 8   Period Weeks   Status New   OT LONG TERM GOAL #2   Title Pt will perform simple meal prep/home maintenance tasks mod I.   Time 8   Period Weeks   Status New   OT LONG TERM GOAL #3   Title Pt will improve L hand coordination for ADLs as shown by completing 9-hole peg test in less than 45sec.   Baseline 77.34sec   Time 8   Period Weeks   Status New   OT LONG TERM GOAL #4   Title Pt will demo at least 55lbs L grip strength for ADLs/opening containers.   Baseline 30lbs    Time 8   Period Weeks   Status New               Plan - 11/03/14 1618    Clinical Impression Statement Pt continues to progress towards goals for LUE strength. Patient's 9-hole peg test time decreased from 77 seconds to 62 seconds. Patient's left grip strength increased from 30lbs to 35lbs.    Pt will benefit from skilled therapeutic intervention in order to improve on the following deficits (Retired) Decreased activity tolerance;Decreased balance;Decreased mobility;Decreased strength;Pain;Impaired UE functional use;Decreased knowledge of use of DME;Decreased coordination   Rehab Potential Good   OT Frequency 2x / week   OT Duration 8 weeks   OT Treatment/Interventions Passive range of motion;Moist Heat;Cryotherapy;DME and/or AE instruction;Therapeutic activities;Energy conservation;Therapeutic exercises;Neuromuscular education;Manual Therapy;Self-care/ADL training;Therapeutic exercise;Functional Mobility Training;Patient/family education   Plan coordination, hand strength, endurance, fine motor   OT Home Exercise Plan issued coordination 10/12/14   Consulted and Agree with Plan of Care Patient;Family member/caregiver   Family Member Consulted mother        Problem List Patient Active Problem List   Diagnosis Date Noted  . Adjustment disorder with depressed mood   . Tetraplegia ([place]) 09/19/2014  . Acute blood loss anemia 09/19/2014  . Cervical spondylosis with myelopathy 09/18/2014  . Radiculopathy of arm 08/28/2014  . Hypogonadism male 09/08/2013  . 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 Cardiomyopathy 04/01/2011  . Wolff-Parkinson-White (WPW) syndrome 04/01/2011    Decklin Weddington , MS, OTR/L, CLT  11/03/2014, 4:21 PM  [facility] [address] [place], [place], [address] Phone: [phone]   Fax:  [phone]  Name: Mark Gallagher MRN: [id number] Date of Birth: 1970/08/11

## 2014-11-06 ENCOUNTER — Ambulatory Visit: Payer: Managed Care, Other (non HMO) | Admitting: Occupational Therapy

## 2014-11-06 ENCOUNTER — Ambulatory Visit: Payer: Managed Care, Other (non HMO) | Admitting: Physical Therapy

## 2014-11-06 DIAGNOSIS — R29898 Other symptoms and signs involving the musculoskeletal system: Secondary | ICD-10-CM

## 2014-11-06 DIAGNOSIS — M6281 Muscle weakness (generalized): Secondary | ICD-10-CM

## 2014-11-06 DIAGNOSIS — R269 Unspecified abnormalities of gait and mobility: Secondary | ICD-10-CM

## 2014-11-06 DIAGNOSIS — R42 Dizziness and giddiness: Secondary | ICD-10-CM | POA: Diagnosis not present

## 2014-11-06 DIAGNOSIS — M21372 Foot drop, left foot: Secondary | ICD-10-CM

## 2014-11-06 DIAGNOSIS — R279 Unspecified lack of coordination: Secondary | ICD-10-CM

## 2014-11-06 DIAGNOSIS — R278 Other lack of coordination: Secondary | ICD-10-CM

## 2014-11-06 NOTE — Therapy (Addendum)
[facility] [address] [place], [place], [address] Phone: [phone]   Fax:  [phone]  Physical Therapy Treatment  Patient Details  Name: Mark Gallagher MRN: [id number] Date of Birth: [birth year] No Data Recorded  Encounter Date: 11/06/2014      PT End of Session - 11/06/14 1624    Visit Number 12   Number of Visits 21   Date for PT Re-Evaluation 12/08/14   Authorization Type [employer]    PT Start Time 3007   PT Stop Time 1539   PT Time Calculation (min) 50 min   Equipment Utilized During Treatment Cervical collar;Gait belt   Activity Tolerance Patient tolerated treatment well;No increased pain   Behavior During Therapy [facility] for tasks assessed/performed      Past Medical History  Diagnosis Date  . Thyroid disease   . Cancer ([place])   . 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 Testosterone deficiency   . Knee pain, bilateral   . History of pituitary tumor     Past Surgical History  Procedure Laterality Date  . Cervical disc surgery  2007    Anterior C4/5 vertebrectomy and c3-C6 laminoplasty with plating  . Craniotomy for tumor      pituitary adenoma    There were no vitals filed for this visit.  Visit Diagnosis:  Abnormality of gait  Muscle weakness (generalized)  Foot drop, left      Subjective Assessment - 11/06/14 1501    Subjective Pt reporting ongoing fatigue. Continues to report 2/10 pain in neck.   Patient is accompained by: Family member   Pertinent History Cervical precautions. Hard cervical collar at all times. PMH: cervical spondylosis with myelopathy; s/p cervical vertebrectomy C4/5 with C3-6 plating 2007 acromegaly s/p pituitary tumor resection (1994); hypogonadism; Wolff-Parkinson-White Syndrome; cardiomyopathy; acute blood loss anemia; adjustment disorder with depressed mood   Patient Stated Goals "I'd like to be able to walk by myself with no assistance, stand up out of a chair by myself, be able to  get up and down stairs without assistance.   Currently in Pain? Yes   Pain Score 2    Pain Location Neck   Pain Orientation Mid   Pain Descriptors / Indicators Sore   Pain Type Acute pain   Pain Onset 1 to 4 weeks ago   Pain Frequency Intermittent   Aggravating Factors  malpositioning   Pain Relieving Factors repositioning   Effect of Pain on Daily Activities PT will monitor but will not directly address pain due to nature.                         [facility] Adult PT Treatment/Exercise - 11/06/14 1611    Ambulation/Gait   Ambulation/Gait Yes   Ambulation/Gait Assistance 4: Min assist;6: Modified independent (Device/Increase time);4: Min guard   Ambulation/Gait Assistance Details Mod I with bariatric RW; min guard-min A with bariatric LBQC   Ambulation Distance (Feet) 600 Feet  x200' outdoors; 2 x200' indoors   Assistive device Large base quad cane;Rolling walker  L hinged knee brace; L AFO   Gait Pattern Step-through pattern;Decreased hip/knee flexion - left;Decreased weight shift to left;Left flexed knee in stance;Poor foot clearance - left;Left genu recurvatum  intermittent L toe catch   Ambulation Surface Level;Unlevel;Indoor;Outdoor;Paved   Curb 4: Min assist   Curb Details (indicate cue type and reason) negotiated standard curb step  x2 trials with [facility] requiring min A to ascend, min guard to descend;  initial trial with cueing for sequencing/technique, subsequent trial with tactile cueing at L knee for proprioceptive input.   Gait Comments During ambulation over outdoor surfaces with [facility], noted L knee wavering between buckling (flexion) and recurvatum   Neuro Re-ed    Neuro Re-ed Details  Pt performed the following in tall kneeling without UE support to increase proximal stability/control in LLE, for proprioceptive input in LLE: RLE forward/retro advancement x10 reps per direction; sidestepping    Exercises   Exercises Other Exercises   Other Exercises  Partial  squats x8 reps, x10 reps with mirror in front/to L of pt for visual feedback requiring manual approximation at L knee for proprioception, tactile cueing at posterior L knee for HS control.                 PT Education - 11/06/14 1610    Education provided Yes   Education Details Per pt/mother questions, discussed options for obtaining bariatric LBQC (ordering vs. buying from medical supply store).   Person(s) Educated Patient;Parent(s)  mother   Methods Explanation   Comprehension Verbalized understanding          PT Short Term Goals - 11/01/14 1657    PT SHORT TERM GOAL #1   Title Pt will perform initial HEP with mod I using paper handout to indicate safe HEP compliance, maximize functional gains made in PT. Target date: 11/06/14   Baseline Met 9/22.   Status Achieved   PT SHORT TERM GOAL #2   Title Perform 6 Minute Walk Test to assess/address functional endurance. Target date: 11/06/14   Baseline 9/20: 556' with RW   Status Achieved   PT SHORT TERM GOAL #3   Title Perform TUG to assess/address efficiency of functional mobility. Target date: 11/06/14   Baseline 9/20: TUG time = 28.3 seconds with RW   Status Achieved   PT SHORT TERM GOAL #4   Title Pt will ambulate 200' over level, indoor surfaces with mod I using LRAD to indicate increased safety/indepedence with household ambulation. Target date: 11/06/14   Baseline Met 10/12 with bariatric RW.   Status Achieved   PT SHORT TERM GOAL #5   Title Pt will negotiate 14 stairs with B rails and min A of single person to indicate increased independence accessing second floor of home. Target date: 11/06/14   Baseline 10/10: required min guard of PT for stairs x16   Status Achieved           PT Long Term Goals - 10/09/14 2125    PT LONG TERM GOAL #1   Title Pt will improve 6 Minute Walk distance by 150' from baseline to indicate significant improvement in functional endurance. Target date: 12/04/14   PT LONG TERM GOAL #2    Title Pt will improve TUG score by 10.8 seconds from baseline to indicate significant improvement in efficiency of functional mobility.  Target date: 12/04/14   PT LONG TERM GOAL #3   Title Pt will ambulate 500' over unlevel, paved surfaces with mod I using LRAD to indicate increased stability/independence with community mobility. Target date: 12/04/14   PT LONG TERM GOAL #4   Title Pt will negotiate standard ramp and curb step with mod I using LRAD to indicate increased safety/indepedence traversing community obstacles. Target date: 12/04/14   PT LONG TERM GOAL #5   Title Pt will negotiate 14 stairs with 2 rails and mod I to indicate increased independence accessing second floor of home.  Target date: 12/04/14  Plan - 11/06/14 1625    Clinical Impression Statement Initiated gait with [facility] over unlevel, paved surfaces as well as curb step negotiation with LBQC. Due to increased L knee instability (wavering between buckling and L genu recurvatum). Continue per POC.   Pt will benefit from skilled therapeutic intervention in order to improve on the following deficits Abnormal gait;Decreased activity tolerance;Decreased balance;Decreased endurance;Decreased coordination;Decreased mobility;Decreased strength;Impaired sensation;Pain   Rehab Potential Good   PT Frequency Other (comment)  3x/week for initial 4 weeks; 2x/week for subsequent 4 weeks   PT Duration Other (comment)  see above   PT Treatment/Interventions Vestibular;ADLs/Self Care Home Management;DME Instruction;Balance training;Therapeutic exercise;Therapeutic activities;Functional mobility training;Stair training;Gait training;Neuromuscular re-education;Patient/family education   PT Next Visit Plan Continue gait training with bariatric LBQC, balance training, core strengthening   Consulted and Agree with Plan of Care Patient;Family member/caregiver   Family Member Consulted mother        Problem List Patient  Active Problem List   Diagnosis Date Noted  . Adjustment disorder with depressed mood   . Tetraplegia ([place]) 09/19/2014  . Acute blood loss anemia 09/19/2014  . Cervical spondylosis with myelopathy 09/18/2014  . Radiculopathy of arm 08/28/2014  . Hypogonadism male 09/08/2013  . 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 Cardiomyopathy 04/01/2011  . Wolff-Parkinson-White (WPW) syndrome 04/01/2011    Billie Ruddy, PT, DPT [facility] [address] [place], [place], [address] Phone: [phone]   Fax:  [phone] 11/06/2014, 4:57 PM   Name: Mark Gallagher MRN: [id number] Date of Birth: [birth year]

## 2014-11-06 NOTE — Therapy (Signed)
[facility] [address] [place], [place], [address] Phone: [phone]   Fax:  [phone]  Occupational Therapy Treatment  Patient Details  Name: Mark Gallagher MRN: [id number] Date of Birth: 07/06/1970 No Data Recorded  Encounter Date: 11/06/2014      OT End of Session - 11/06/14 1410    Visit Number 9   Number of Visits 17   Date for OT Re-Evaluation 12/08/14   Authorization Type [employer], 60 visit limit combined   OT Start Time 1402   OT Stop Time 1445   OT Time Calculation (min) 43 min   Activity Tolerance Patient tolerated treatment well   Behavior During Therapy [facility] for tasks assessed/performed      Past Medical History  Diagnosis Date  . Thyroid disease   . Cancer ([place])   . 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 Testosterone deficiency   . Knee pain, bilateral   . History of pituitary tumor     Past Surgical History  Procedure Laterality Date  . Cervical disc surgery  2007    Anterior C4/5 vertebrectomy and c3-C6 laminoplasty with plating  . Craniotomy for tumor      pituitary adenoma    There were no vitals filed for this visit.  Visit Diagnosis:  Muscle weakness (generalized)  Left hand weakness  Decreased coordination      Subjective Assessment - 11/06/14 1407    Subjective  Pt reports continued tingling in L hand and concerns regarding fatigue.   Patient is accompained by: Family member   Pertinent History C3-T2 fusion 09/10/14 with cardiac surgery after surgery, hx of previous C1-C6 fusion (2007)   Patient Stated Goals return to work   Currently in Pain? Yes   Pain Score 2    Pain Location Neck   Pain Orientation Mid   Pain Descriptors / Indicators Sore   Pain Type Acute pain   Aggravating Factors  malpositioning    Pain Relieving Factors repositioning                      OT Treatments/Exercises (OP) - 11/06/14 0001    ADLs   Overall ADLs Checked STGs and discussed  progress--see goals section.  BADL performance limited by cervical precautions/brace.  Discussed that pt is not ready to return to work as he originally hoped, but that he is still making slow, steady progress.  Will send note to spine surgeon via fax regarding continued precautions regarding overhead reaching, low-range strengthening, and lifting restrictions.  Pt/mother verbalized understanding.     Hand Exercises   Other Hand Exercises Picking up blocks using 35lbs sustained grip strength with LUE with min difficulty/drops and incr time   Fine Motor Coordination   Fine Motor Coordination Purdue Pegboard   Purdue Pegboard with min difficulty and incr time for coordination.                  OT Short Term Goals - 11/06/14 1417    OT SHORT TERM GOAL #1   Title Pt will verbalize understanding of coordination/hand strength HEP--check STGs 11/08/14   Time 4   Period Weeks   Status Achieved   OT SHORT TERM GOAL #2   Title Pt will perfom BADLs mod I.   Baseline min A bathing   Time 4   Period Weeks   Status On-going  11/06/14:  supervision for shower transfer, min A for R brace/neck brace   OT SHORT TERM GOAL #3  Title Pt will improve L hand coordination for ADLs as shown by completing 9-hole peg test in less than 55sec.   Baseline 77.34sec   Time 4   Period Weeks   Status On-going  11/06/14:  56.94sec   OT SHORT TERM GOAL #4   Title Pt will demo at least 40lbs L grip strength for ADLs/opening containers.   Baseline 30lbs   Time 4   Period Weeks   Status Achieved  11/06/14:  45lbs           OT Long Term Goals - 11/03/14 1620    OT LONG TERM GOAL #1   Title Pt will be independent with proximal UE HEP when cleared from cervical/lifting precautions.--check LTGs 12/09/14   Time 8   Period Weeks   Status New   OT LONG TERM GOAL #2   Title Pt will perform simple meal prep/home maintenance tasks mod I.   Time 8   Period Weeks   Status New   OT LONG TERM GOAL #3    Title Pt will improve L hand coordination for ADLs as shown by completing 9-hole peg test in less than 45sec.   Baseline 77.34sec   Time 8   Period Weeks   Status New   OT LONG TERM GOAL #4   Title Pt will demo at least 55lbs L grip strength for ADLs/opening containers.   Baseline 30lbs   Time 8   Period Weeks   Status New               Plan - 11/06/14 1526    Clinical Impression Statement Pt has been making good progress with coordination and hand strength.  Pt met 2/4 STGs and is approaching remaining 2 STGs but is limited with ADLs due to cervical precautions/brace.   Plan coordination, hand strength, endurance   OT Home Exercise Plan issued coordination 10/12/14   Recommended Other Services sent note to spinal surgeon 11/06/14 regarding precautions (see pt instruction for note sent via fax)   Consulted and Agree with Plan of Care Patient;Family member/caregiver   Family Member Consulted mother        Problem List Patient Active Problem List   Diagnosis Date Noted  . Adjustment disorder with depressed mood   . Tetraplegia ([place]) 09/19/2014  . Acute blood loss anemia 09/19/2014  . Cervical spondylosis with myelopathy 09/18/2014  . Radiculopathy of arm 08/28/2014  . Hypogonadism male 09/08/2013  . 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 Cardiomyopathy 04/01/2011  . Wolff-Parkinson-White (WPW) syndrome 04/01/2011    [facility] 11/06/2014, 3:29 PM  [facility] [address] [place], [place], [address] Phone: [phone]   Fax:  [phone]  Name: Mark Gallagher MRN: [id number] Date of Birth: [birth year]  Vianne Bulls, OTR/L 11/06/2014 3:29 PM

## 2014-11-06 NOTE — Patient Instructions (Signed)
11/06/14  Dr. Marin Shutter,  I have been seeing Mark Gallagher (DOB: 22-Aug-1970) for occupational therapy s/p cervical fusion.  We have been focusing on coordination with L hand and L hand strengthening as well as low-mid range reaching for incr activity tolerance/endurance.  Pt has been making good progress.  Please clarify the following so that we can continue to progress his therapy as appropriate.  Pt reports that he has to wear cervical brace until 12/20/14, so I wanted to see if he can begin increasing activity or if he needs to wait until cervical brace is discharged.  1.  Is pt allowed to perform overhead reaching?  If not, when will he be cleared?  2.  Is pt able to perform low-range strengthening (i.e. Light theraband)?  If not, when will he be cleared?  3.  What is his current lifting restriction?  Please contact me with any questions or concerns.  Thank you,  Vianne Bulls, OTR/L 11/06/2014 3:22 PM [place]  [phone] phone [phone] fax

## 2014-11-07 ENCOUNTER — Telehealth: Payer: Self-pay | Admitting: Physical Therapy

## 2014-11-07 ENCOUNTER — Ambulatory Visit: Payer: Managed Care, Other (non HMO) | Admitting: Occupational Therapy

## 2014-11-07 ENCOUNTER — Ambulatory Visit: Payer: Managed Care, Other (non HMO) | Admitting: Physical Therapy

## 2014-11-07 DIAGNOSIS — R278 Other lack of coordination: Secondary | ICD-10-CM

## 2014-11-07 DIAGNOSIS — R42 Dizziness and giddiness: Secondary | ICD-10-CM | POA: Diagnosis not present

## 2014-11-07 DIAGNOSIS — R29898 Other symptoms and signs involving the musculoskeletal system: Secondary | ICD-10-CM

## 2014-11-07 DIAGNOSIS — M6281 Muscle weakness (generalized): Secondary | ICD-10-CM

## 2014-11-07 DIAGNOSIS — R279 Unspecified lack of coordination: Secondary | ICD-10-CM

## 2014-11-07 DIAGNOSIS — R2681 Unsteadiness on feet: Secondary | ICD-10-CM

## 2014-11-07 NOTE — Therapy (Signed)
[facility] [address] [place], [place], [address] Phone: [phone]   Fax:  [phone]  Physical Therapy Treatment  Patient Details  Name: Mark Gallagher MRN: [id number] Date of Birth: Aug 03, 1970 No Data Recorded  Encounter Date: 11/07/2014      PT End of Session - 11/07/14 1814    Visit Number 13   Number of Visits 21   Date for PT Re-Evaluation 12/08/14   Authorization Type Aetna    PT Start Time 1450   PT Stop Time 1530   PT Time Calculation (min) 40 min   Equipment Utilized During Treatment Cervical collar;Gait belt   Activity Tolerance Patient tolerated treatment well;Treatment limited secondary to medical complications (Comment)  Dizziness, lightheadedness, HR irregularity   Behavior During Therapy [facility] for tasks assessed/performed      Past Medical History  Diagnosis Date  . Thyroid disease   . Cancer ([place])   . 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 Testosterone deficiency   . Knee pain, bilateral   . History of pituitary tumor     Past Surgical History  Procedure Laterality Date  . Cervical disc surgery  2007    Anterior C4/5 vertebrectomy and c3-C6 laminoplasty with plating  . Craniotomy for tumor      pituitary adenoma    There were no vitals filed for this visit.  Visit Diagnosis:  Dizziness and giddiness  Unsteadiness      Subjective Assessment - 11/07/14 1454    Subjective Pt reporting a "teeny bit" of dizziness this morning when he woke up. Pt continues to note "just feeling really tired," per pt. Pt reports no falls.   Patient is accompained by: Family member   Pertinent History Cervical precautions. Hard cervical collar at all times. PMH: cervical spondylosis with myelopathy; s/p cervical vertebrectomy C4/5 with C3-6 plating 2007 acromegaly s/p pituitary tumor resection (1994); hypogonadism; Wolff-Parkinson-White Syndrome; cardiomyopathy; acute blood loss anemia; adjustment disorder with  depressed mood   Patient Stated Goals "I'd like to be able to walk by myself with no assistance, stand up out of a chair by myself, be able to get up and down stairs without assistance.   Currently in Pain? Yes   Pain Score 2    Pain Location Neck   Pain Orientation Mid   Pain Descriptors / Indicators Sore   Pain Type Acute pain   Pain Onset 1 to 4 weeks ago   Pain Frequency Intermittent   Aggravating Factors  malpositioning   Pain Relieving Factors repositioning   Effect of Pain on Daily Activities PT will monitor but will not directly address pain due to nature.   Multiple Pain Sites No                Vestibular Assessment - 11/07/14 0001    Vestibular Assessment   General Observation Pt reporting a "teeny bit of dizziness" upon getting OOB this am.   Symptom Behavior   Type of Dizziness Spinning   Frequency of Dizziness first thing in morning   Duration of Dizziness < 1 min   Aggravating Factors Mornings   Relieving Factors No known relieving factors   Positional Testing   Horizontal Canal Testing Horizontal Canal Left;Horizontal Canal Right   Horizontal Canal Right   Horizontal Canal Right Symptoms Geotrophic   Horizontal Canal Left   Horizontal Canal Left Symptoms Geotrophic   Horizontal Canal Right Intensity   Horizontal Canal Right Intensity Severe   Right Intensity Comment 45-second latency; duration  of approx. 15 seconds (nystagmus and dizziness)   Horizontal Canal Left Intensity   Horizontal Canal Left Intensity Mild   Orthostatics   BP supine (x 5 minutes) 104/68 mmHg   HR supine (x 5 minutes) 50  regularly irregular rhythm   BP standing (after 1 minute) 110/82 mmHg   HR standing (after 1 minute) 78   BP standing (after 3 minutes) 114/90 mmHg   HR standing (after 3 minutes) 76  Pt reports feeling "tired"                 [facility] Adult PT Treatment/Exercise - 11/07/14 0001    Transfers   Transfers Sit to Stand;Stand to Sit   Sit to Stand 6:  Modified independent (Device/Increase time);4: Min assist   Sit to Stand Details (indicate cue type and reason) Mod I for all sit > stand transfers with RW, with exception of one transfer during which pt sustained significant LOB (posterior and to R side)   Stand to Sit 6: Modified independent (Device/Increase time)   Ambulation/Gait   Ambulation/Gait Yes   Ambulation/Gait Assistance 6: Modified independent (Device/Increase time)   Ambulation Distance (Feet) 220 Feet  x200' outdoors; 2 x200' indoors   Assistive device Rolling walker  L hinged knee brace; L AFO   Gait Pattern Step-through pattern;Decreased hip/knee flexion - left;Decreased weight shift to left;Left flexed knee in stance;Poor foot clearance - left;Left genu recurvatum  intermittent L toe catch   Ambulation Surface Level;Indoor   Curb --   Gait Comments --   Neuro Re-ed    Neuro Re-ed Details  Pt performed the following in tall kneeling without UE support to increase proximal stability/control in LLE, for proprioceptive input in LLE: RLE forward/retro advancement x10 reps per direction; sidestepping    Exercises   Exercises Other Exercises   Other Exercises  Partial squats x8 reps, x10 reps with mirror in front/to L of pt for visual feedback requiring manual approximation at L knee for proprioception, tactile cueing at posterior L knee for HS control.          Vestibular Treatment/Exercise - 11/07/14 0001    Vestibular Treatment/Exercise   Vestibular Treatment Provided Habituation   OTHER   Comment Initially explained and demonstrated horizontal canal habituation with emphasis on maintaining sidelyinfg x60 seconds due to latency of symptoms, nystagmus during this session. However, after pt reported increased fatigue and exhibited significant LOB in standing, recommended no habituation to avoid exacerbation of change in HR, symptoms.      Position BP HR Symptoms  Supine x5 min 104/68 50 (regularly irregular)   Standing  x1 min 110/82 78   Standing x3 min 114/90 76 "tired"             PT Education - 11/07/14 1811    Education provided Yes   Education Details Recommended pt/mother contact cardiologist if symptoms of fatigue, dizziness worsen. Recommending no habituation for vertigo at this time.   Person(s) Educated Patient;Parent(s)   Methods Explanation   Comprehension Verbalized understanding          PT Short Term Goals - 11/01/14 1657    PT SHORT TERM GOAL #1   Title Pt will perform initial HEP with mod I using paper handout to indicate safe HEP compliance, maximize functional gains made in PT. Target date: 11/06/14   Baseline Met 9/22.   Status Achieved   PT SHORT TERM GOAL #2   Title Perform 6 Minute Walk Test to assess/address functional endurance. Target  date: 11/06/14   Baseline 9/20: 67' with RW   Status Achieved   PT SHORT TERM GOAL #3   Title Perform TUG to assess/address efficiency of functional mobility. Target date: 11/06/14   Baseline 9/20: TUG time = 28.3 seconds with RW   Status Achieved   PT SHORT TERM GOAL #4   Title Pt will ambulate 200' over level, indoor surfaces with mod I using LRAD to indicate increased safety/indepedence with household ambulation. Target date: 11/06/14   Baseline Met 10/12 with bariatric RW.   Status Achieved   PT SHORT TERM GOAL #5   Title Pt will negotiate 14 stairs with B rails and min A of single person to indicate increased independence accessing second floor of home. Target date: 11/06/14   Baseline 10/10: required min guard of PT for stairs x16   Status Achieved           PT Long Term Goals - 10/09/14 2125    PT LONG TERM GOAL #1   Title Pt will improve 6 Minute Walk distance by 150' from baseline to indicate significant improvement in functional endurance. Target date: 12/04/14   PT LONG TERM GOAL #2   Title Pt will improve TUG score by 10.8 seconds from baseline to indicate significant improvement in efficiency of functional  mobility.  Target date: 12/04/14   PT LONG TERM GOAL #3   Title Pt will ambulate 500' over unlevel, paved surfaces with mod I using LRAD to indicate increased stability/independence with community mobility. Target date: 12/04/14   PT LONG TERM GOAL #4   Title Pt will negotiate standard ramp and curb step with mod I using LRAD to indicate increased safety/indepedence traversing community obstacles. Target date: 12/04/14   PT LONG TERM GOAL #5   Title Pt will negotiate 14 stairs with 2 rails and mod I to indicate increased independence accessing second floor of home.  Target date: 12/04/14               Plan - 11/07/14 1823    Clinical Impression Statement Pt with reports of dizziness at beginning of session. Findings consistent with R horizontal canal canalithiasis with uncharacteristically long latency of 45 seconds. Initially educated pt/mother on horziontal canal habituation x60 seconds; however, pt with significant LOB and dizziness upon performing sit > stand. Therefore, measured orthostatic vitals, which revealed low BP, low HR (50 bpm) and regularly irregular HR in supine. Sent in-basket message to notify patient's provider at cardiology office. Now recommending no habituation at this time due to HR irregularity. Continue per POC.   Pt will benefit from skilled therapeutic intervention in order to improve on the following deficits Abnormal gait;Decreased activity tolerance;Decreased balance;Decreased endurance;Decreased coordination;Decreased mobility;Decreased strength;Impaired sensation;Pain   Rehab Potential Good   PT Frequency Other (comment)  3x/week for initial 4 weeks; 2x/week for subsequent 4 weeks   PT Duration Other (comment)  see above   PT Treatment/Interventions Vestibular;ADLs/Self Care Home Management;DME Instruction;Balance training;Therapeutic exercise;Therapeutic activities;Functional mobility training;Stair training;Gait training;Neuromuscular  re-education;Patient/family education   PT Next Visit Plan Ask about dizziness. Ask if pt contacted cardiologist. Continue gait training with bariatric [facility], balance training, core strengthening   Recommended Other Services 10/18: sent in-basket message to cardiologist's NP to notify of syptoms and HR irregularity in supine   Consulted and Agree with Plan of Care Patient;Family member/caregiver   Family Member Consulted mother        Problem List Patient Active Problem List   Diagnosis Date Noted  . Adjustment disorder  with depressed mood   . Tetraplegia ([place]) 09/19/2014  . Acute blood loss anemia 09/19/2014  . Cervical spondylosis with myelopathy 09/18/2014  . Radiculopathy of arm 08/28/2014  . Hypogonadism male 09/08/2013  . 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 Cardiomyopathy 04/01/2011  . Wolff-Parkinson-White (WPW) syndrome 04/01/2011   Billie Ruddy, PT, DPT [facility] [address] [place], [place], [address] Phone: [phone]   Fax:  [phone] 11/07/2014, 6:31 PM   Name: OMRI BERTRAN MRN: [id number] Date of Birth: 06/13/1970

## 2014-11-07 NOTE — Patient Instructions (Signed)
Cane exercises: Seated hold broomstick in both hands, raise arms to 90*(eye level) then lower, 20 reps 1-2 x day  Hold broomstick in both hands raise arm out to the side, 90* 20 reps 1-2x day    ROM: Abduction - Wand   Sit Holding wand with left hand palm up, push wand directly out to side, leading with other hand palm down, until stretch is felt. Hold 5 seconds. Repeat with other arm Repeat 20 times per set. Do 2 sessions per day.             Elbow Flexion: Resisted   Sit With tubing held in _one_____ hand(s) and other end secured under foot, curl arm up as far as possible. Repeat _20___ times per set. Do _1-2___ sessions per day, every other day.    Elbow Extension: Resisted   Sit in chair with resistive band secured at armrest (or hold with other hand) and __one_____ elbow bent. Straighten elbow. Repeat _20__ times per set.  Do _1-2___ sessions per day, every other day.   Copyright  VHI. All rights reserved.

## 2014-11-07 NOTE — Telephone Encounter (Signed)
Mark Gallagher been seeing Mark Gallagher in outpatient physical therapy.Today, the patient sustained a significant loss of balance accompanied by pt-reported "dizziness" immediately after sit to stand. See orthostatic vital signs below.  Position BP HR Symptoms  Supine x5 min 104/68 50 (regularly irregular)   Standing x1 min 110/82 78   Standing x3 min 114/90 76 "tired"   The patient has reported increased fatigue over the past week. I just wanted to make you aware of the HR irregularity in supine due to recent change in pt report and presentation.  Please feel free to contact me with any questions.  Thank you,  Billie Ruddy, PT, DPT [facility] [address] [place] [employer] [place], [place], [address] Phone: [phone]   Fax:  [phone] 11/07/2014, 4:01 PM

## 2014-11-07 NOTE — Therapy (Signed)
[facility] [address] [place], [place], [address] Phone: [phone]   Fax:  [phone]  Occupational Therapy Treatment  Patient Details  Name: Mark Gallagher MRN: [id number] Date of Birth: Apr 17, 1970 No Data Recorded  Encounter Date: 11/07/2014      OT End of Session - 11/07/14 1652    Visit Number 10   Number of Visits 17   Date for OT Re-Evaluation 12/08/14   Authorization Type A[employer], 60 visit limit combined   OT Start Time 1540   OT Stop Time 1625   OT Time Calculation (min) 45 min   Activity Tolerance Patient tolerated treatment well   Behavior During Therapy [facility] for tasks assessed/performed      Past Medical History  Diagnosis Date  . Thyroid disease   . Cancer ([place])   . 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 Testosterone deficiency   . Knee pain, bilateral   . History of pituitary tumor     Past Surgical History  Procedure Laterality Date  . Cervical disc surgery  2007    Anterior C4/5 vertebrectomy and c3-C6 laminoplasty with plating  . Craniotomy for tumor      pituitary adenoma    There were no vitals filed for this visit.  Visit Diagnosis:  Muscle weakness (generalized)  Left hand weakness  Decreased coordination      Subjective Assessment - 11/07/14 1624    Patient is accompained by: Family member   Pertinent History C3-T2 fusion 09/10/14 with cardiac surgery after surgery, hx of previous C1-C6 fusion (2007)   Patient Stated Goals return to work   Currently in Pain? Yes   Pain Score 2    Pain Location Neck   Pain Type Acute pain   Pain Onset 1 to 4 weeks ago   Pain Frequency Intermittent   Aggravating Factors  malpositioning   Pain Relieving Factors repositioning   Effect of Pain on Daily Activities OT is monitoring but not addressing directly   Multiple Pain Sites No         Treatment: Therapist updated HEP. See pt instructions. Arm bike x 8 mins no resistance for conditioning.  Gripper set at 35 lbs for sustained grip /resistance, mod difficulty / drops with LUE.                     OT Education - 11/07/14 1649    Education provided Yes   Education Details updated HEP   Person(s) Educated Patient;Parent(s)   Methods Explanation;Demonstration;Verbal cues;Handout   Comprehension Verbalized understanding;Returned demonstration          OT Short Term Goals - 11/06/14 1417    OT SHORT TERM GOAL #1   Title Pt will verbalize understanding of coordination/hand strength HEP--check STGs 11/08/14   Time 4   Period Weeks   Status Achieved   OT SHORT TERM GOAL #2   Title Pt will perfom BADLs mod I.   Baseline min A bathing   Time 4   Period Weeks   Status On-going  11/06/14:  supervision for shower transfer, min A for R brace/neck brace   OT SHORT TERM GOAL #3   Title Pt will improve L hand coordination for ADLs as shown by completing 9-hole peg test in less than 55sec.   Baseline 77.34sec   Time 4   Period Weeks   Status On-going  11/06/14:  56.94sec   OT SHORT TERM GOAL #4   Title Pt will demo at least  40lbs L grip strength for ADLs/opening containers.   Baseline 30lbs   Time 4   Period Weeks   Status Achieved  11/06/14:  45lbs           OT Long Term Goals - 11/03/14 1620    OT LONG TERM GOAL #1   Title Pt will be independent with proximal UE HEP when cleared from cervical/lifting precautions.--check LTGs 12/09/14   Time 8   Period Weeks   Status New   OT LONG TERM GOAL #2   Title Pt will perform simple meal prep/home maintenance tasks mod I.   Time 8   Period Weeks   Status New   OT LONG TERM GOAL #3   Title Pt will improve L hand coordination for ADLs as shown by completing 9-hole peg test in less than 45sec.   Baseline 77.34sec   Time 8   Period Weeks   Status New   OT LONG TERM GOAL #4   Title Pt will demo at least 55lbs L grip strength for ADLs/opening containers.   Baseline 30lbs   Time 8   Period Weeks    Status New               Plan - 11/07/14 1650    Clinical Impression Statement Pt is progressing towards goals. Pt was limited today by vertigo   Pt will benefit from skilled therapeutic intervention in order to improve on the following deficits (Retired) Decreased activity tolerance;Decreased balance;Decreased mobility;Decreased strength;Pain;Impaired UE functional use;Decreased knowledge of use of DME;Decreased coordination   Rehab Potential Good   OT Frequency 2x / week   OT Duration 8 weeks   OT Treatment/Interventions Passive range of motion;Moist Heat;Cryotherapy;DME and/or AE instruction;Therapeutic activities;Energy conservation;Therapeutic exercises;Neuromuscular education;Manual Therapy;Self-care/ADL training;Therapeutic exercise;Functional Mobility Training;Patient/family education   Plan coordination, hand strength, endurance   OT Home Exercise Plan issued coordination 10/12/14, cane/ theraband for biceps/ triceps   Recommended Other Services await response from spinal surgeon   Consulted and Agree with Plan of Care Patient;Family member/caregiver   Family Member Consulted mother        Problem List Patient Active Problem List   Diagnosis Date Noted  . Adjustment disorder with depressed mood   . Tetraplegia ([place]) 09/19/2014  . Acute blood loss anemia 09/19/2014  . Cervical spondylosis with myelopathy 09/18/2014  . Radiculopathy of arm 08/28/2014  . Hypogonadism male 09/08/2013  . 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 Cardiomyopathy 04/01/2011  . Wolff-Parkinson-White (WPW) syndrome 04/01/2011    RINE,KATHRYN 11/07/2014, 4:52 PM Theone Murdoch, OTR/L [phone] Phone: [phone] 4:53 PM 11/07/2014 [facility] [address] [place], [place], [address] Phone: [phone]   Fax:  [phone]  Name: Mark Gallagher MRN: [id number] Date of Birth: 12-24-1970

## 2014-11-08 ENCOUNTER — Ambulatory Visit: Payer: Managed Care, Other (non HMO) | Admitting: Physical Therapy

## 2014-11-08 DIAGNOSIS — R42 Dizziness and giddiness: Secondary | ICD-10-CM | POA: Diagnosis not present

## 2014-11-08 DIAGNOSIS — R2681 Unsteadiness on feet: Secondary | ICD-10-CM

## 2014-11-08 DIAGNOSIS — M6281 Muscle weakness (generalized): Secondary | ICD-10-CM

## 2014-11-08 DIAGNOSIS — R269 Unspecified abnormalities of gait and mobility: Secondary | ICD-10-CM

## 2014-11-08 NOTE — Therapy (Signed)
[facility] [address] [place], [place], [address] Phone: [phone]   Fax:  [phone]  Physical Therapy Treatment  Patient Details  Name: Mark Gallagher MRN: [id number] Date of Birth: 12-Mar-1970 No Data Recorded  Encounter Date: 11/08/2014      PT End of Session - 11/08/14 1945    Visit Number 14   Number of Visits 21   Date for PT Re-Evaluation 12/08/14   Authorization Type Aetna    PT Start Time 1445   PT Stop Time 1535   PT Time Calculation (min) 50 min   Equipment Utilized During Treatment Cervical collar;Gait belt   Activity Tolerance Patient tolerated treatment well   Behavior During Therapy [facility] for tasks assessed/performed      Past Medical History  Diagnosis Date  . Thyroid disease   . Cancer ([place])   . 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 Testosterone deficiency   . Knee pain, bilateral   . History of pituitary tumor     Past Surgical History  Procedure Laterality Date  . Cervical disc surgery  2007    Anterior C4/5 vertebrectomy and c3-C6 laminoplasty with plating  . Craniotomy for tumor      pituitary adenoma    There were no vitals filed for this visit.  Visit Diagnosis:  Abnormality of gait  Muscle weakness (generalized)  Unsteadiness      Subjective Assessment - 11/08/14 1452    Subjective Pt reporting no dizziness and states, "I'm feeling a lot better today. I've had a lot of water to drink."   Patient is accompained by: Family member  mother, father   Pertinent History Cervical precautions. Hard cervical collar at all times. PMH: cervical spondylosis with myelopathy; s/p cervical vertebrectomy C4/5 with C3-6 plating 2007 acromegaly s/p pituitary tumor resection (1994); hypogonadism; Wolff-Parkinson-White Syndrome; cardiomyopathy; acute blood loss anemia; adjustment disorder with depressed mood   Patient Stated Goals "I'd like to be able to walk by myself with no assistance, stand up out  of a chair by myself, be able to get up and down stairs without assistance.   Currently in Pain? Yes   Pain Score 2    Pain Location Neck   Pain Orientation Mid   Pain Descriptors / Indicators Sore   Pain Type Surgical pain   Pain Onset More than a month ago   Pain Frequency Intermittent   Aggravating Factors  malpositioning   Pain Relieving Factors repositioning   Effect of Pain on Daily Activities PT will monitor but not directly address pain due to nature of surgical pain                         [facility] Adult PT Treatment/Exercise - 11/08/14 0001    Transfers   Transfers Sit to Stand;Stand to Sit   Sit to Stand 6: Modified independent (Device/Increase time)   Stand to Sit 6: Modified independent (Device/Increase time)   Ambulation/Gait   Ambulation/Gait Yes   Ambulation/Gait Assistance 6: Modified independent (Device/Increase time);5: Supervision;4: Min guard;4: Min assist   Ambulation/Gait Assistance Details x250 with bariatric LBQC requiring min guard to min A.   Ambulation Distance (Feet) 450 Feet  total   Assistive device Rolling walker;Parallel bars  L hinged knee brace; L AFO   Gait Pattern Step-through pattern;Decreased hip/knee flexion - left;Decreased weight shift to left;Left flexed knee in stance;Left genu recurvatum;Narrow base of support;Lateral trunk lean to right  intermittent L toe catch  Ambulation Surface Level;Indoor   Gait Comments Blocked practice of gait training at parallel bars without UE support requiring min guard; cueing focused on L stance stability to prevent high velocity knee extension (and occasionally L knee hyperextension). Verbal cueing focused on upright posture, wider BOS, and slower gait velocity; manual approximation provided at L knee for proprioceptive input; mirror anterior to pt for visual input of L knee position.   Neuro Re-ed    Neuro Re-ed Details  Standing with RUE support at parallel bars, mirror in front of pt for  visual feedback, and green Tband anchored at [facility] (A/P resistance) to increase proprioceptive input during activity involving multidirectional LLE then RLE tapping onto targets of varying size/height. Verbal cueing focused on increasing pt awareness of speed of movement, LLE position (prior to initiating movement), and L knee position (via mirror) to ensure L knee stable prior to advancing/tapping object with RLE.                PT Education - 11/08/14 1931    Education provided Yes   Education Details Emphasized slower gait velocity to increase safety/control during ambulation.   Person(s) Educated Patient;Parent(s)   Methods Explanation;Demonstration;Tactile cues;Verbal cues   Comprehension Verbalized understanding;Returned demonstration          PT Short Term Goals - 11/01/14 1657    PT SHORT TERM GOAL #1   Title Pt will perform initial HEP with mod I using paper handout to indicate safe HEP compliance, maximize functional gains made in PT. Target date: 11/06/14   Baseline Met 9/22.   Status Achieved   PT SHORT TERM GOAL #2   Title Perform 6 Minute Walk Test to assess/address functional endurance. Target date: 11/06/14   Baseline 9/20: 556' with RW   Status Achieved   PT SHORT TERM GOAL #3   Title Perform TUG to assess/address efficiency of functional mobility. Target date: 11/06/14   Baseline 9/20: TUG time = 28.3 seconds with RW   Status Achieved   PT SHORT TERM GOAL #4   Title Pt will ambulate 200' over level, indoor surfaces with mod I using LRAD to indicate increased safety/indepedence with household ambulation. Target date: 11/06/14   Baseline Met 10/12 with bariatric RW.   Status Achieved   PT SHORT TERM GOAL #5   Title Pt will negotiate 14 stairs with B rails and min A of single person to indicate increased independence accessing second floor of home. Target date: 11/06/14   Baseline 10/10: required min guard of PT for stairs x16   Status Achieved            PT Long Term Goals - 10/09/14 2125    PT LONG TERM GOAL #1   Title Pt will improve 6 Minute Walk distance by 150' from baseline to indicate significant improvement in functional endurance. Target date: 12/04/14   PT LONG TERM GOAL #2   Title Pt will improve TUG score by 10.8 seconds from baseline to indicate significant improvement in efficiency of functional mobility.  Target date: 12/04/14   PT LONG TERM GOAL #3   Title Pt will ambulate 500' over unlevel, paved surfaces with mod I using LRAD to indicate increased stability/independence with community mobility. Target date: 12/04/14   PT LONG TERM GOAL #4   Title Pt will negotiate standard ramp and curb step with mod I using LRAD to indicate increased safety/indepedence traversing community obstacles. Target date: 12/04/14   PT LONG TERM GOAL #5   Title Pt will  negotiate 14 stairs with 2 rails and mod I to indicate increased independence accessing second floor of home.  Target date: 12/04/14               Plan - 11/08/14 1948    Clinical Impression Statement Session focused on increasing proprioceptive and visual input during dynamic single limb stance activities to increase L knee stability during gait/mobility. Emphasized decreasing gait velocity to increase control/safety during gait. Pt with no reports of dizziness, ligthheadedness, or vertiginous symptoms today. Continue per POC.   Pt will benefit from skilled therapeutic intervention in order to improve on the following deficits Abnormal gait;Decreased activity tolerance;Decreased balance;Decreased endurance;Decreased coordination;Decreased mobility;Decreased strength;Impaired sensation;Pain   Rehab Potential Good   PT Frequency Other (comment)  3x/week for initial 4 weeks; 2x/week for subsequent 4 weeks   PT Duration Other (comment)  see above   PT Treatment/Interventions Vestibular;ADLs/Self Care Home Management;DME Instruction;Balance training;Therapeutic exercise;Therapeutic  activities;Functional mobility training;Stair training;Gait training;Neuromuscular re-education;Patient/family education   PT Next Visit Plan  Continue gait training with bariatric LBQC, balance training, core strengthening. Address L knee control.   Consulted and Agree with Plan of Care Patient;Family member/caregiver   Family Member Consulted mother and father        Problem List Patient Active Problem List   Diagnosis Date Noted  . Adjustment disorder with depressed mood   . Tetraplegia ([place]) 09/19/2014  . Acute blood loss anemia 09/19/2014  . Cervical spondylosis with myelopathy 09/18/2014  . Radiculopathy of arm 08/28/2014  . Hypogonadism male 09/08/2013  . 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 Cardiomyopathy 04/01/2011  . Wolff-Parkinson-White (WPW) syndrome 04/01/2011    Billie Ruddy, PT, DPT [facility] [address] [place], [place], [address] Phone: [phone]   Fax:  [phone] 11/08/2014, 7:49 PM  Name: Mark Gallagher MRN: [id number] Date of Birth: 24-Aug-1970

## 2014-11-13 ENCOUNTER — Ambulatory Visit: Payer: Managed Care, Other (non HMO) | Admitting: Occupational Therapy

## 2014-11-13 ENCOUNTER — Ambulatory Visit: Payer: Managed Care, Other (non HMO) | Admitting: Physical Therapy

## 2014-11-13 DIAGNOSIS — R279 Unspecified lack of coordination: Secondary | ICD-10-CM

## 2014-11-13 DIAGNOSIS — R42 Dizziness and giddiness: Secondary | ICD-10-CM | POA: Diagnosis not present

## 2014-11-13 DIAGNOSIS — M6281 Muscle weakness (generalized): Secondary | ICD-10-CM

## 2014-11-13 DIAGNOSIS — R2681 Unsteadiness on feet: Secondary | ICD-10-CM

## 2014-11-13 DIAGNOSIS — R278 Other lack of coordination: Secondary | ICD-10-CM

## 2014-11-13 DIAGNOSIS — R29898 Other symptoms and signs involving the musculoskeletal system: Secondary | ICD-10-CM

## 2014-11-13 DIAGNOSIS — R269 Unspecified abnormalities of gait and mobility: Secondary | ICD-10-CM

## 2014-11-13 NOTE — Therapy (Signed)
[facility] [address] [place], [place], [address] Phone: [phone]   Fax:  [phone]  Physical Therapy Treatment  Patient Details  Name: Mark Gallagher MRN: [id number] Date of Birth: [birth year] No Data Recorded  Encounter Date: 11/13/2014      PT End of Session - 11/13/14 1255    Visit Number 15   Number of Visits 21   Date for PT Re-Evaluation 12/08/14   Authorization Type Aetna    PT Start Time 5102   PT Stop Time 1230   PT Time Calculation (min) 45 min   Equipment Utilized During Treatment Cervical collar;Gait belt   Activity Tolerance Patient tolerated treatment well   Behavior During Therapy [facility] for tasks assessed/performed      Past Medical History  Diagnosis Date  . Thyroid disease   . Cancer ([place])   . 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 Testosterone deficiency   . Knee pain, bilateral   . History of pituitary tumor     Past Surgical History  Procedure Laterality Date  . Cervical disc surgery  2007    Anterior C4/5 vertebrectomy and c3-C6 laminoplasty with plating  . Craniotomy for tumor      pituitary adenoma    There were no vitals filed for this visit.  Visit Diagnosis:  Abnormality of gait  Muscle weakness (generalized)  Unsteadiness      Subjective Assessment - 11/13/14 1241    Subjective Pt reporting pain in lateral aspect of L lower leg which increases with LLE weightbearing during walking. Continues to report intermittent dizziness (<5 seconds duration) with rolling in bed. Mother spoke with [facility] cardiologist's office, who reviewed HR monitor findings with mother. Per mother, cardiologist's NP feels as though current medication is appropriate. Pt has not experienced any further episodes of lightheadedness or dizziness with standing up, walking.   Patient is accompained by: Family member   Pertinent History Cervical precautions. Hard cervical collar at all times. PMH: cervical spondylosis  with myelopathy; s/p cervical vertebrectomy C4/5 with C3-6 plating 2007 acromegaly s/p pituitary tumor resection (1994); hypogonadism; Wolff-Parkinson-White Syndrome; cardiomyopathy; acute blood loss anemia; adjustment disorder with depressed mood   Patient Stated Goals "I'd like to be able to walk by myself with no assistance, stand up out of a chair by myself, be able to get up and down stairs without assistance.   Currently in Pain? Yes   Pain Score 2    Pain Location Neck   Pain Orientation Mid   Pain Descriptors / Indicators Sore   Pain Type Surgical pain   Pain Onset More than a month ago   Pain Frequency Intermittent   Aggravating Factors  malpositioning   Pain Relieving Factors repositioning   Effect of Pain on Daily Activities PT will monitor but will not directly address due to nature of surgical pain.   Multiple Pain Sites Yes   Pain Score 3   Pain Location Leg   Pain Orientation Left;Lower   Pain Descriptors / Indicators Sharp   Pain Type Acute pain   Pain Onset Yesterday   Pain Frequency Intermittent   Aggravating Factors  LLE weightbearing during ambulation   Pain Relieving Factors sitting; walking with L toe pointing outward   Effect of Pain on Daily Activities limits walking tolerance            [facility] PT Assessment - 11/13/14 0001    Sensation   Additional Comments Reproduced pain in lateral aspect of L lower leg  with A/P mobilization of L fibular head.   Flexibility   Soft Tissue Assessment /Muscle Length yes   ITB L modified Ober Test (-) with no reproduction of concordant pain.            Vestibular Assessment - 11/13/14 0001    Positional Testing   Horizontal Canal Testing Horizontal Canal Right;Horizontal Canal Left   Horizontal Canal Right   Horizontal Canal Right Duration < 3 seconds   Horizontal Canal Right Symptoms Geotrophic   Horizontal Canal Left   Horizontal Canal Left Duration no nystagmus observed   Horizontal Canal Right Intensity    Horizontal Canal Right Intensity Mild   Right Intensity Comment No latent nystagmus (remained in R side lying for > 3 minutes)   Horizontal Canal Left Intensity   Horizontal Canal Left Intensity --  N/A                 [facility] Adult PT Treatment/Exercise - 11/13/14 0001    Transfers   Transfers Sit to Stand   Sit to Stand 6: Modified independent (Device/Increase time)   Stand to Sit 6: Modified independent (Device/Increase time)   Ambulation/Gait   Ambulation/Gait Yes   Ambulation/Gait Assistance 6: Modified independent (Device/Increase time);5: Supervision   Ambulation/Gait Assistance Details supervision, tactile/verbal cueing for neutral rotation at L tibia (able to achieve this with verbal cueing for L "toe out"), which mitigated pain in LLE.   Ambulation Distance (Feet) 300 Feet   Assistive device Rolling walker  L hinged knee brace; L AFO   Gait Pattern Step-through pattern;Decreased hip/knee flexion - left;Decreased weight shift to left;Left flexed knee in stance;Left genu recurvatum;Narrow base of support;Lateral trunk lean to right  intermittent L toe catch   Ambulation Surface Level;Indoor   Exercises   Exercises Other Exercises   Other Exercises  Explained, demonstrated B clamshells; see Pt Instructions for details on exercise and reps/sets.                PT Education - 11/13/14 1240    Education provided Yes   Education Details Neutral tibial rotation during gait to mitigate pain in lateral aspect of L lower leg. Modified Tband-resisted B hip ABD to clamshells.   Person(s) Educated Patient;Parent(s)   Methods Explanation;Demonstration;Tactile cues;Verbal cues   Comprehension Verbalized understanding;Returned demonstration          PT Short Term Goals - 11/01/14 1657    PT SHORT TERM GOAL #1   Title Pt will perform initial HEP with mod I using paper handout to indicate safe HEP compliance, maximize functional gains made in PT. Target date: 11/06/14    Baseline Met 9/22.   Status Achieved   PT SHORT TERM GOAL #2   Title Perform 6 Minute Walk Test to assess/address functional endurance. Target date: 11/06/14   Baseline 9/20: 556' with RW   Status Achieved   PT SHORT TERM GOAL #3   Title Perform TUG to assess/address efficiency of functional mobility. Target date: 11/06/14   Baseline 9/20: TUG time = 28.3 seconds with RW   Status Achieved   PT SHORT TERM GOAL #4   Title Pt will ambulate 200' over level, indoor surfaces with mod I using LRAD to indicate increased safety/indepedence with household ambulation. Target date: 11/06/14   Baseline Met 10/12 with bariatric RW.   Status Achieved   PT SHORT TERM GOAL #5   Title Pt will negotiate 14 stairs with B rails and min A of single person to indicate increased independence  accessing second floor of home. Target date: 11/06/14   Baseline 10/10: required min guard of PT for stairs x16   Status Achieved           PT Long Term Goals - 10/09/14 2125    PT LONG TERM GOAL #1   Title Pt will improve 6 Minute Walk distance by 150' from baseline to indicate significant improvement in functional endurance. Target date: 12/04/14   PT LONG TERM GOAL #2   Title Pt will improve TUG score by 10.8 seconds from baseline to indicate significant improvement in efficiency of functional mobility.  Target date: 12/04/14   PT LONG TERM GOAL #3   Title Pt will ambulate 500' over unlevel, paved surfaces with mod I using LRAD to indicate increased stability/independence with community mobility. Target date: 12/04/14   PT LONG TERM GOAL #4   Title Pt will negotiate standard ramp and curb step with mod I using LRAD to indicate increased safety/indepedence traversing community obstacles. Target date: 12/04/14   PT LONG TERM GOAL #5   Title Pt will negotiate 14 stairs with 2 rails and mod I to indicate increased independence accessing second floor of home.  Target date: 12/04/14               Plan -  11/13/14 1255    Clinical Impression Statement Pt reporting recent onset of pain in lateral aspect of L lower leg (just distal to L knee) with LLE W B during gait. Able to reproduce concordant pain with A/P mobilization of L fibular head. Pain mitigated when pt cued for neutral L tibial rotation during gait. Continue per POC.   Pt will benefit from skilled therapeutic intervention in order to improve on the following deficits Abnormal gait;Decreased activity tolerance;Decreased balance;Decreased endurance;Decreased coordination;Decreased mobility;Decreased strength;Impaired sensation;Pain   PT Frequency Other (comment)  3x/week for initial 4 weeks; 2x/week for subsequent 4 weeks   PT Duration Other (comment)  See above.   PT Treatment/Interventions Vestibular;ADLs/Self Care Home Management;DME Instruction;Balance training;Therapeutic exercise;Therapeutic activities;Functional mobility training;Stair training;Gait training;Neuromuscular re-education;Patient/family education   PT Next Visit Plan  Continue gait training with bariatric LBQC, balance training, core strengthening. Address L knee control.   Consulted and Agree with Plan of Care Patient;Family member/caregiver   Family Member Consulted mother and father        Problem List Patient Active Problem List   Diagnosis Date Noted  . Adjustment disorder with depressed mood   . Tetraplegia ([place]) 09/19/2014  . Acute blood loss anemia 09/19/2014  . Cervical spondylosis with myelopathy 09/18/2014  . Radiculopathy of arm 08/28/2014  . Hypogonadism male 09/08/2013  . 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 Cardiomyopathy 04/01/2011  . Wolff-Parkinson-White (WPW) syndrome 04/01/2011    Billie Ruddy, PT, DPT [facility] [address] [place], [place], [address] Phone: [phone]   Fax:  [phone] 11/13/2014, 1:01 PM   Name: DERRIC DEALMEIDA MRN: [id number] Date of Birth: 10-18-1970

## 2014-11-13 NOTE — Therapy (Signed)
[facility] [address] [place], [place], [address] Phone: [phone]   Fax:  [phone]  Occupational Therapy Treatment  Patient Details  Name: Mark Gallagher MRN: [id number] Date of Birth: 1971-01-10 No Data Recorded  Encounter Date: 11/13/2014      OT End of Session - 11/13/14 1325    Visit Number 11   Number of Visits 17   Date for OT Re-Evaluation 12/08/14   Authorization Type Aetna, 60 visit limit combined   OT Start Time 1317   OT Stop Time 1400   OT Time Calculation (min) 43 min   Activity Tolerance Patient tolerated treatment well   Behavior During Therapy [facility] for tasks assessed/performed      Past Medical History  Diagnosis Date  . Thyroid disease   . Cancer ([place])   . 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 Testosterone deficiency   . Knee pain, bilateral   . History of pituitary tumor     Past Surgical History  Procedure Laterality Date  . Cervical disc surgery  2007    Anterior C4/5 vertebrectomy and c3-C6 laminoplasty with plating  . Craniotomy for tumor      pituitary adenoma    There were no vitals filed for this visit.  Visit Diagnosis:  Muscle weakness (generalized)  Left hand weakness  Decreased coordination      Subjective Assessment - 11/13/14 1320    Subjective  Pt reports that tingling seems to be a little better with using putty.   Patient is accompained by: Family member   Pertinent History C3-T2 fusion 09/10/14 with cardiac surgery after surgery, hx of previous C1-C6 fusion (2007)   Patient Stated Goals return to work   Currently in Pain? Yes   Pain Score 2    Pain Location Neck   Pain Descriptors / Indicators Sore   Pain Type Surgical pain   Pain Frequency Intermittent   Aggravating Factors  malpositioning   Pain Relieving Factors repoositioning                      OT Treatments/Exercises (OP) - 11/13/14 1334    ADLs   Work Pt reports that his FMLA has ended  and that he is talking with HR regarding extending leave.  Pt/family given [facility] contact info as resource to assist with accommodations, questions, and eventual return to work if needed.  Pt concerned that he may lose his job.   ADL Comments Recommended pt try light home management tasks and snack prep with countertop support to incr activity tolerance.  Pt verbalized understanding.   Exercises   Exercises Shoulder   Shoulder Exercises: Seated   Other Seated Exercises Arm bike x16min level 1 for conditioning without rest to maintain >40rpms   Fine Motor Coordination   Grooved pegs with min-mod difficulty for coordination/in-hand manipulation, min v.c. for compensation   Functional Reaching Activities   Mid Level Functional reaching to place medium pegs in vertical pegboard for incr activity tolerance and coordination with min v.c. for compensation.                  OT Education - 11/13/14 1322    Education Details Reviewed updated HEP (cane/theraband)   Person(s) Educated Patient   Methods Explanation;Demonstration;Verbal cues   Comprehension Verbalized understanding;Returned demonstration;Verbal cues required          OT Short Term Goals - 11/06/14 1417    OT SHORT TERM GOAL #1  Title Pt will verbalize understanding of coordination/hand strength HEP--check STGs 11/08/14   Time 4   Period Weeks   Status Achieved   OT SHORT TERM GOAL #2   Title Pt will perfom BADLs mod I.   Baseline min A bathing   Time 4   Period Weeks   Status On-going  11/06/14:  supervision for shower transfer, min A for R brace/neck brace   OT SHORT TERM GOAL #3   Title Pt will improve L hand coordination for ADLs as shown by completing 9-hole peg test in less than 55sec.   Baseline 77.34sec   Time 4   Period Weeks   Status On-going  11/06/14:  56.94sec   OT SHORT TERM GOAL #4   Title Pt will demo at least 40lbs L grip strength for ADLs/opening containers.   Baseline 30lbs   Time 4    Period Weeks   Status Achieved  11/06/14:  45lbs           OT Long Term Goals - 11/03/14 1620    OT LONG TERM GOAL #1   Title Pt will be independent with proximal UE HEP when cleared from cervical/lifting precautions.--check LTGs 12/09/14   Time 8   Period Weeks   Status New   OT LONG TERM GOAL #2   Title Pt will perform simple meal prep/home maintenance tasks mod I.   Time 8   Period Weeks   Status New   OT LONG TERM GOAL #3   Title Pt will improve L hand coordination for ADLs as shown by completing 9-hole peg test in less than 45sec.   Baseline 77.34sec   Time 8   Period Weeks   Status New   OT LONG TERM GOAL #4   Title Pt will demo at least 55lbs L grip strength for ADLs/opening containers.   Baseline 30lbs   Time 8   Period Weeks   Status New               Plan - 11/13/14 1338    Clinical Impression Statement Pt is progressing well with coordination, strength, endurance.   Plan simple home maintenance task, coordination, hand strength, endurance   OT Home Exercise Plan issued coordination 10/12/14   Consulted and Agree with Plan of Care Patient;Family member/caregiver   Family Member Consulted mother        Problem List Patient Active Problem List   Diagnosis Date Noted  . Adjustment disorder with depressed mood   . Tetraplegia ([place]) 09/19/2014  . Acute blood loss anemia 09/19/2014  . Cervical spondylosis with myelopathy 09/18/2014  . Radiculopathy of arm 08/28/2014  . Hypogonadism male 09/08/2013  . 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 Cardiomyopathy 04/01/2011  . Wolff-Parkinson-White (WPW) syndrome 04/01/2011    [facility] 11/13/2014, 4:52 PM  [facility] [address] [place], [place], [address] Phone: [phone]   Fax:  [phone]  Name: Mark Gallagher MRN: [id number] Date of Birth: 1970-08-15  Vianne Bulls, OTR/L 11/13/2014 4:52 PM

## 2014-11-13 NOTE — Patient Instructions (Addendum)
Patient Instructions     Knee Extension: Terminal - Standing (Single Leg)   Stand with both hands on rolling walker. LEFT foot should be positioned slightly in front of right foot. Stand up nice and tall. Loop GREEN theraband around the top of your LEFT knee; anchor the other side of the band by placing the knotted ends of the stockinette in the door closure (as we discussed during PT).  Use your glute muscles to straighten out your LEFT knee. * Be sure not to let it go too straight. Relax. Repeat. Perform 15 reps, 3 times per day.    Functional Quadriceps: Sit to Stand   Sit on edge of you 23 inch bed with your rolling walker in front of you (just in case). Stand up SLOWLY using one hand only. Try to avoid pushing the back fo your knees against the bed to stand. Then SLOWLY sit back down (without plopping).  Perform 10 reps. 3 times per day.    Abduction: Clam (Eccentric) - Side-Lying    Lie on side with knees bent. Lift top knee, keeping feet together. Keep trunk steady. Slowly lower for 3-5 seconds.  RIGHT leg: 15 reps, 3 times per day LEFT leg: 8 reps, 3 times per day      Self Treatment for Horizontal Canalithiasis - Right Side Affected    Beginning on back, roll onto right side. If you get dizzy, wait until dizziness ends then add 20 seconds. Perform the same exercise to the opposite side. Do 5 times per session; 2 sessions per side.

## 2014-11-14 ENCOUNTER — Ambulatory Visit: Payer: Managed Care, Other (non HMO) | Admitting: Physical Therapy

## 2014-11-14 ENCOUNTER — Ambulatory Visit: Payer: Managed Care, Other (non HMO) | Admitting: Occupational Therapy

## 2014-11-14 DIAGNOSIS — R2681 Unsteadiness on feet: Secondary | ICD-10-CM

## 2014-11-14 DIAGNOSIS — R269 Unspecified abnormalities of gait and mobility: Secondary | ICD-10-CM

## 2014-11-14 DIAGNOSIS — R279 Unspecified lack of coordination: Secondary | ICD-10-CM

## 2014-11-14 DIAGNOSIS — R278 Other lack of coordination: Secondary | ICD-10-CM

## 2014-11-14 DIAGNOSIS — M6281 Muscle weakness (generalized): Secondary | ICD-10-CM

## 2014-11-14 DIAGNOSIS — R42 Dizziness and giddiness: Secondary | ICD-10-CM | POA: Diagnosis not present

## 2014-11-14 NOTE — Therapy (Signed)
[facility] [address] [place], [place], [address] Phone: [phone]   Fax:  [phone]  Occupational Therapy Treatment  Patient Details  Name: Mark Gallagher MRN: [id number] Date of Birth: 06/02/1970 No Data Recorded  Encounter Date: 11/14/2014      OT End of Session - 11/14/14 1557    Visit Number 12   Number of Visits 17   Date for OT Re-Evaluation 12/08/14   Authorization Type [employer], 60 visit limit combined   OT Start Time 1405   OT Stop Time 1445   OT Time Calculation (min) 40 min   Activity Tolerance Patient tolerated treatment well   Behavior During Therapy [facility] for tasks assessed/performed      Past Medical History  Diagnosis Date  . Thyroid disease   . Cancer ([place])   . 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 Testosterone deficiency   . Knee pain, bilateral   . History of pituitary tumor     Past Surgical History  Procedure Laterality Date  . Cervical disc surgery  2007    Anterior C4/5 vertebrectomy and c3-C6 laminoplasty with plating  . Craniotomy for tumor      pituitary adenoma    There were no vitals filed for this visit.  Visit Diagnosis:  Muscle weakness (generalized)  Decreased coordination  Unsteadiness      Subjective Assessment - 11/14/14 1412    Subjective  Pt reports that he is not performing in cleaning/meal prep tasks at home currently.   Patient is accompained by: Family member   Pertinent History C3-T2 fusion 09/10/14 with cardiac surgery after surgery, hx of previous C1-C6 fusion (2007)   Patient Stated Goals return to work   Currently in Pain? Yes   Pain Score 2    Pain Location Neck   Pain Descriptors / Indicators Sore   Pain Type Surgical pain   Aggravating Factors  malpositioning   Pain Relieving Factors repositioning                      OT Treatments/Exercises (OP) - 11/14/14 0001    ADLs   Home Maintenance In standing, placing clothes in washer, then  removing to put in dryer, followed by removing from dryer (in sitting to place in laundry basket).  Then, pt stood to fold clothes.  Pt needed 2 rest breaks due to fatigue.  Pt instructed in RW placement while in kitchen or when pt has countertop support.  Pt verbalized understanding.   Instructed pt to load/unload dishwasher (top rack), fold laundry (but do not carry/lift laundry basket), attempt to make sandwich, warm items in microwave, etc.    Hand Exercises   Other Hand Exercises Picking up blocks using 55lbs sustained grip strength with LUE with min-mod difficulty/drops   Fine Motor Coordination   Small Pegboard Placing small pegs in pegboard with L hand with min difficulty                OT Education - 11/14/14 1555    Education Details RW placement when in the kitchen, strategies for incr IADL activity, recommendation to incr participation in light IADLs at home    Person(s) Educated Patient   Methods Explanation;Verbal cues;Demonstration   Comprehension Verbalized understanding;Verbal cues required;Returned demonstration          OT Short Term Goals - 11/06/14 1417    OT SHORT TERM GOAL #1   Title Pt will verbalize understanding of coordination/hand strength HEP--check STGs 11/08/14  Time 4   Period Weeks   Status Achieved   OT SHORT TERM GOAL #2   Title Pt will perfom BADLs mod I.   Baseline min A bathing   Time 4   Period Weeks   Status On-going  11/06/14:  supervision for shower transfer, min A for R brace/neck brace   OT SHORT TERM GOAL #3   Title Pt will improve L hand coordination for ADLs as shown by completing 9-hole peg test in less than 55sec.   Baseline 77.34sec   Time 4   Period Weeks   Status On-going  11/06/14:  56.94sec   OT SHORT TERM GOAL #4   Title Pt will demo at least 40lbs L grip strength for ADLs/opening containers.   Baseline 30lbs   Time 4   Period Weeks   Status Achieved  11/06/14:  45lbs           OT Long Term Goals -  11/03/14 1620    OT LONG TERM GOAL #1   Title Pt will be independent with proximal UE HEP when cleared from cervical/lifting precautions.--check LTGs 12/09/14   Time 8   Period Weeks   Status New   OT LONG TERM GOAL #2   Title Pt will perform simple meal prep/home maintenance tasks mod I.   Time 8   Period Weeks   Status New   OT LONG TERM GOAL #3   Title Pt will improve L hand coordination for ADLs as shown by completing 9-hole peg test in less than 45sec.   Baseline 77.34sec   Time 8   Period Weeks   Status New   OT LONG TERM GOAL #4   Title Pt will demo at least 55lbs L grip strength for ADLs/opening containers.   Baseline 30lbs   Time 8   Period Weeks   Status New               Plan - 11/14/14 1558    Clinical Impression Statement Pt is progressing with coordination, strength, and endurance.  However, encouraged pt to incr participation in light snack prep and home maintenance tasks.     Plan simple home maintenance task/snack prep, coordination, endurance   OT Home Exercise Plan Education issued: coordination 10/12/14, cane HEP and yellow band (bicep/triceps)   Consulted and Agree with Plan of Care Patient;Family member/caregiver   Family Member Consulted mother, father        Problem List Patient Active Problem List   Diagnosis Date Noted  . Adjustment disorder with depressed mood   . Tetraplegia ([place]) 09/19/2014  . Acute blood loss anemia 09/19/2014  . Cervical spondylosis with myelopathy 09/18/2014  . Radiculopathy of arm 08/28/2014  . Hypogonadism male 09/08/2013  . 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 Cardiomyopathy 04/01/2011  . Wolff-Parkinson-White (WPW) syndrome 04/01/2011    [facility] 11/14/2014, 4:11 PM  [facility] [address] [place], [place], [address] Phone: [phone]   Fax:  [phone]  Name: Mark Gallagher MRN: [id number] Date of Birth: Jul 03, 1970  Vianne Bulls,  OTR/L 11/14/2014 4:11 PM

## 2014-11-14 NOTE — Therapy (Signed)
[facility] [address] [place], [place], [address] Phone: [phone]   Fax:  [phone]  Physical Therapy Treatment  Patient Details  Name: Mark Gallagher MRN: [id number] Date of Birth: [birth year] No Data Recorded  Encounter Date: 11/14/2014      PT End of Session - 11/14/14 2124    Visit Number 16   Number of Visits 21   Date for PT Re-Evaluation 12/08/14   Authorization Type [employer]    PT Start Time 1533   PT Stop Time 1622   PT Time Calculation (min) 49 min   Equipment Utilized During Treatment Cervical collar;Gait belt   Activity Tolerance Patient tolerated treatment well   Behavior During Therapy [facility] for tasks assessed/performed      Past Medical History  Diagnosis Date  . Thyroid disease   . Cancer ([place])   . 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 Testosterone deficiency   . Knee pain, bilateral   . History of pituitary tumor     Past Surgical History  Procedure Laterality Date  . Cervical disc surgery  2007    Anterior C4/5 vertebrectomy and c3-C6 laminoplasty with plating  . Craniotomy for tumor      pituitary adenoma    There were no vitals filed for this visit.  Visit Diagnosis:  Abnormality of gait  Unsteadiness  Muscle weakness (generalized)      Subjective Assessment - 11/14/14 1538    Subjective Pt reporting improvement in L knee pain today, as compared with previous session. Denies falls and dizziness. Pt also states, "I did the stairs almost by myself today. My mom want just standing there but she didn't help me at all."   Patient is accompained by: Family member  mother and father   Pertinent History Cervical precautions. Hard cervical collar at all times. PMH: cervical spondylosis with myelopathy; s/p cervical vertebrectomy C4/5 with C3-6 plating 2007 acromegaly s/p pituitary tumor resection (1994); hypogonadism; Wolff-Parkinson-White Syndrome; cardiomyopathy; acute blood loss anemia;  adjustment disorder with depressed mood   Patient Stated Goals "I'd like to be able to walk by myself with no assistance, stand up out of a chair by myself, be able to get up and down stairs without assistance.   Currently in Pain? Yes   Pain Score 2    Pain Location Neck   Pain Orientation Mid   Pain Descriptors / Indicators Sore   Pain Type Surgical pain   Pain Onset More than a month ago   Pain Frequency Intermittent   Aggravating Factors  mapositioning   Pain Relieving Factors repositioning   Effect of Pain on Daily Activities PT will monitor but will ont directly address due to nature of surgical pain.   Multiple Pain Sites Yes   Pain Score 1   Pain Location Leg   Pain Orientation Left;Lower   Pain Descriptors / Indicators Sharp   Pain Type Acute pain   Pain Onset Yesterday   Pain Frequency Intermittent   Aggravating Factors  with walking   Pain Relieving Factors sitting                         [facility] Adult PT Treatment/Exercise - 11/14/14 0001    Transfers   Transfers Sit to Stand;Stand to Sit   Sit to Stand 6: Modified independent (Device/Increase time)   Sit to Stand Details (indicate cue type and reason) increased time; with bariatric LBQC   Stand to Sit 6: Modified  independent (Device/Increase time)   Ambulation/Gait   Ambulation/Gait Yes   Ambulation/Gait Assistance 5: Supervision;4: Min guard;4: Min assist   Ambulation/Gait Assistance Details x230' with [facility] requiring supervision to min guard; x230' without AD requiring min guard-min A; x275' with LBQC over unlevel surfaces requiring min guard   Ambulation Distance (Feet) 735 Feet  indoors: 2 x230' (rest break between trials); outdoors: x275   Assistive device None;Large base quad cane   Gait Pattern Step-through pattern;Decreased hip/knee flexion - left;Decreased weight shift to left;Left flexed knee in stance;Narrow base of support;Lateral trunk lean to right  intermittent L toe catch   Ambulation  Surface Level;Indoor   Door Management 4: Min assist;1: +1 Total assist   Door Managment Details (indicate cue type and reason) with LBQC, pt required min A to manage lighter swinging door, Total A for heavy door to adhere to cervical precautions.   Ramp 4: Min assist   Ramp Details (indicate cue type and reason) min guard using LBQC   Curb 5: Supervision   Curb Details (indicate cue type and reason) using LBQC, required supervision, cueing for technique/sequencing   Gait Comments During initial gait trial (x230'), 2 lb weight on LLE for increased proprioceptive input. During gait training, noted improved L knee control (no episodes of L genu recurvatum). During second gait trial (x230') without AD, noted excessive lateral weight shift to L during RLE advancement.    Neuro Re-ed    Neuro Re-ed Details  Performed the following in tall kneeling without UE support to increase proximal stability, improve proprioceptive input to increase gait stability: RLE forward/retro stepping with tactile cueing at L ribcage, R shoulder for trunk control (to prevent excessive lateral trunk lean/displacement to L side); lateral stepping in B directions 5 x4 steps per direction. Pt required mod A to recover from single LOB to L side due to excessive lateral trunk lean to L side during L side stepping in tall kneeling.                  PT Short Term Goals - 11/01/14 1657    PT SHORT TERM GOAL #1   Title Pt will perform initial HEP with mod I using paper handout to indicate safe HEP compliance, maximize functional gains made in PT. Target date: 11/06/14   Baseline Met 9/22.   Status Achieved   PT SHORT TERM GOAL #2   Title Perform 6 Minute Walk Test to assess/address functional endurance. Target date: 11/06/14   Baseline 9/20: 556' with RW   Status Achieved   PT SHORT TERM GOAL #3   Title Perform TUG to assess/address efficiency of functional mobility. Target date: 11/06/14   Baseline 9/20: TUG time =  28.3 seconds with RW   Status Achieved   PT SHORT TERM GOAL #4   Title Pt will ambulate 200' over level, indoor surfaces with mod I using LRAD to indicate increased safety/indepedence with household ambulation. Target date: 11/06/14   Baseline Met 10/12 with bariatric RW.   Status Achieved   PT SHORT TERM GOAL #5   Title Pt will negotiate 14 stairs with B rails and min A of single person to indicate increased independence accessing second floor of home. Target date: 11/06/14   Baseline 10/10: required min guard of PT for stairs x16   Status Achieved           PT Long Term Goals - 10/09/14 2125    PT LONG TERM GOAL #1   Title Pt will improve  6 Minute Walk distance by 150' from baseline to indicate significant improvement in functional endurance. Target date: 12/04/14   PT LONG TERM GOAL #2   Title Pt will improve TUG score by 10.8 seconds from baseline to indicate significant improvement in efficiency of functional mobility.  Target date: 12/04/14   PT LONG TERM GOAL #3   Title Pt will ambulate 500' over unlevel, paved surfaces with mod I using LRAD to indicate increased stability/independence with community mobility. Target date: 12/04/14   PT LONG TERM GOAL #4   Title Pt will negotiate standard ramp and curb step with mod I using LRAD to indicate increased safety/indepedence traversing community obstacles. Target date: 12/04/14   PT LONG TERM GOAL #5   Title Pt will negotiate 14 stairs with 2 rails and mod I to indicate increased independence accessing second floor of home.  Target date: 12/04/14               Plan - 11/14/14 2128    Clinical Impression Statement Session focused on increasing core/proximal stability to increase gait stability. Pt exhibiting significant improvement in gait stability using LBQC. Pt also tolerated tall kneeling for much longer today (>8 minutes as opposed to <5 minutes in previous sessions).   Pt will benefit from skilled therapeutic intervention  in order to improve on the following deficits Abnormal gait;Decreased activity tolerance;Decreased balance;Decreased endurance;Decreased coordination;Decreased mobility;Decreased strength;Impaired sensation;Pain   PT Frequency Other (comment)  3x/week for initial 4 weeks; 2x/week for subsequent 4 weeks   PT Duration Other (comment)  See above.   PT Treatment/Interventions Vestibular;ADLs/Self Care Home Management;DME Instruction;Balance training;Therapeutic exercise;Therapeutic activities;Functional mobility training;Stair training;Gait training;Neuromuscular re-education;Patient/family education   PT Next Visit Plan  Continue gait training with bariatric LBQC, balance training, core strengthening, L knee control.   Consulted and Agree with Plan of Care Patient;Family member/caregiver   Family Member Consulted mother and father        Problem List Patient Active Problem List   Diagnosis Date Noted  . Adjustment disorder with depressed mood   . Tetraplegia ([place]) 09/19/2014  . Acute blood loss anemia 09/19/2014  . Cervical spondylosis with myelopathy 09/18/2014  . Radiculopathy of arm 08/28/2014  . Hypogonadism male 09/08/2013  . 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 Cardiomyopathy 04/01/2011  . Wolff-Parkinson-White (WPW) syndrome 04/01/2011    Billie Ruddy, PT, DPT [facility] [address] [place], [place], [address] Phone: [phone]   Fax:  [phone] 11/14/2014, 9:29 PM    Name: Mark Gallagher MRN: [id number] Date of Birth: 1970/05/11

## 2014-11-16 ENCOUNTER — Ambulatory Visit (INDEPENDENT_AMBULATORY_CARE_PROVIDER_SITE_OTHER): Payer: Managed Care, Other (non HMO) | Admitting: *Deleted

## 2014-11-16 DIAGNOSIS — E291 Testicular hypofunction: Secondary | ICD-10-CM | POA: Diagnosis not present

## 2014-11-16 NOTE — Progress Notes (Signed)
   Subjective:    Patient ID: Mark Gallagher, male    DOB: Dec 25, 1970, 44 y.o.   MRN: [id number]  HPI    Review of Systems     Objective:   Physical Exam        Assessment & Plan:  Patient here for testosterone injection  200 mg/ml only.

## 2014-11-21 ENCOUNTER — Ambulatory Visit: Payer: Managed Care, Other (non HMO) | Attending: Physical Medicine & Rehabilitation | Admitting: Occupational Therapy

## 2014-11-21 ENCOUNTER — Ambulatory Visit: Payer: Managed Care, Other (non HMO) | Admitting: Physical Therapy

## 2014-11-21 DIAGNOSIS — M6281 Muscle weakness (generalized): Secondary | ICD-10-CM

## 2014-11-21 DIAGNOSIS — R269 Unspecified abnormalities of gait and mobility: Secondary | ICD-10-CM | POA: Insufficient documentation

## 2014-11-21 DIAGNOSIS — R2681 Unsteadiness on feet: Secondary | ICD-10-CM

## 2014-11-21 DIAGNOSIS — R531 Weakness: Secondary | ICD-10-CM | POA: Insufficient documentation

## 2014-11-21 DIAGNOSIS — M21372 Foot drop, left foot: Secondary | ICD-10-CM | POA: Diagnosis present

## 2014-11-21 DIAGNOSIS — R279 Unspecified lack of coordination: Secondary | ICD-10-CM | POA: Insufficient documentation

## 2014-11-21 DIAGNOSIS — M6289 Other specified disorders of muscle: Secondary | ICD-10-CM | POA: Insufficient documentation

## 2014-11-21 DIAGNOSIS — R29898 Other symptoms and signs involving the musculoskeletal system: Secondary | ICD-10-CM

## 2014-11-21 DIAGNOSIS — R278 Other lack of coordination: Secondary | ICD-10-CM

## 2014-11-21 NOTE — Patient Instructions (Signed)
Deep Squat     Stand in front of a bed/couch with your walker in front of you. Loop RED band around both knees. Place feet shoulder-width apart. Squat as though you're about to sit down (but don't actually sit). Make sure your knees don't go over your toes.  Perform 10 reps, 3 times per day. Try not to hold onto walker, if safe.

## 2014-11-21 NOTE — Therapy (Signed)
[facility] [address] [place], [place], [address] Phone: [phone]   Fax:  [phone]  Physical Therapy Treatment  Patient Details  Name: Mark Gallagher MRN: [id number] Date of Birth: September 05, 1970 No Data Recorded  Encounter Date: 11/21/2014      PT End of Session - 11/21/14 1905    Visit Number 17   Number of Visits 21   Date for PT Re-Evaluation 12/08/14   Authorization Type Aetna    PT Start Time 1533   PT Stop Time 1617   PT Time Calculation (min) 44 min   Equipment Utilized During Treatment Cervical collar;Gait belt   Activity Tolerance Patient tolerated treatment well   Behavior During Therapy [facility] for tasks assessed/performed      Past Medical History  Diagnosis Date  . Thyroid disease   . Cancer ([place])   . 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 Testosterone deficiency   . Knee pain, bilateral   . History of pituitary tumor     Past Surgical History  Procedure Laterality Date  . Cervical disc surgery  2007    Anterior C4/5 vertebrectomy and c3-C6 laminoplasty with plating  . Craniotomy for tumor      pituitary adenoma    There were no vitals filed for this visit.  Visit Diagnosis:  Abnormality of gait  Unsteadiness  Muscle weakness (generalized)      Subjective Assessment - 11/21/14 1538    Subjective Pt reporting perceived improvement in balance and stability with walking. Pt arrived to this session using LBQC. Has been negotiating stairs at home without hands-on assist.   Patient is accompained by: Family member  mother and father   Pertinent History Cervical precautions. Hard cervical collar at all times. PMH: cervical spondylosis with myelopathy; s/p cervical vertebrectomy C4/5 with C3-6 plating 2007 acromegaly s/p pituitary tumor resection (1994); hypogonadism; Wolff-Parkinson-White Syndrome; cardiomyopathy; acute blood loss anemia; adjustment disorder with depressed mood   Patient Stated Goals  "I'd like to be able to walk by myself with no assistance, stand up out of a chair by myself, be able to get up and down stairs without assistance.   Currently in Pain? Yes   Pain Score 2    Pain Location Neck   Pain Orientation Mid   Pain Descriptors / Indicators Sore   Pain Type Surgical pain   Pain Onset More than a month ago   Pain Frequency Constant   Aggravating Factors  malpositioning   Pain Relieving Factors repositioning                         [facility] Adult PT Treatment/Exercise - 11/21/14 0001    Transfers   Transfers Sit to Stand;Stand to Sit   Sit to Stand 6: Modified independent (Device/Increase time)   Sit to Stand Details (indicate cue type and reason) with [facility]   Stand to Sit 6: Modified independent (Device/Increase time)   Stand to Sit Details with [facility]   Ambulation/Gait   Ambulation/Gait Yes   Ambulation/Gait Assistance 5: Supervision;6: Modified independent (Device/Increase time)   Ambulation/Gait Assistance Details Using [facility], gait x100' indoors with mod I, x300' outdoors with supervision. Without AD (but using L AFO and L knee brace), gait x115' over level, indoor surfaces with min A, multimodal cueing for L knee control.   Ambulation Distance (Feet) 515 Feet   Assistive device None;Large base quad cane   Gait Pattern Step-through pattern;Decreased hip/knee flexion - left;Decreased weight shift  to left;Left flexed knee in stance;Narrow base of support;Lateral trunk lean to right;Poor foot clearance - left;Trendelenburg  L Trendelenburg noted without AD only   Ambulation Surface Level;Unlevel;Indoor;Outdoor;Paved   Ramp 5: Supervision   Ramp Details (indicate cue type and reason) using LBQC.   Curb 5: Supervision   Curb Details (indicate cue type and reason) using LBQC; supervision required for stability/balance. Pt with effective between-session demonstration of sequencing/technique without cueing from this PT.   Gait Comments Noted significant  improvement in L knee control and no evidence of L Trendelenburg when using LBQC. Pt exhibited inconsistent LLE clearance during during final 4' of ambulation outdoors with [facility]. During gait without AD, pt exhibited L Trendelenburg, lateral trunk lean to R side, and decreased L knee control during LLE stance.   Exercises   Exercises Other Exercises   Other Exercises  Reviewed L clamshell exercise for L hip ABD strengthening; cueing focused on alignment and LLE positioning (due to decreased proprioception). Pt continues to exhibit fatigue after 8 reps on L side. Recommending pt increase reps to 18 on R side. Explained, demonstrated squats x10 with red Tband around B knees for proprioceptive input. Pt gave effective return demonstration with cueing for technique, to decrease quadriceps dominance.                  PT Short Term Goals - 11/01/14 1657    PT SHORT TERM GOAL #1   Title Pt will perform initial HEP with mod I using paper handout to indicate safe HEP compliance, maximize functional gains made in PT. Target date: 11/06/14   Baseline Met 9/22.   Status Achieved   PT SHORT TERM GOAL #2   Title Perform 6 Minute Walk Test to assess/address functional endurance. Target date: 11/06/14   Baseline 9/20: 556' with RW   Status Achieved   PT SHORT TERM GOAL #3   Title Perform TUG to assess/address efficiency of functional mobility. Target date: 11/06/14   Baseline 9/20: TUG time = 28.3 seconds with RW   Status Achieved   PT SHORT TERM GOAL #4   Title Pt will ambulate 200' over level, indoor surfaces with mod I using LRAD to indicate increased safety/indepedence with household ambulation. Target date: 11/06/14   Baseline Met 10/12 with bariatric RW.   Status Achieved   PT SHORT TERM GOAL #5   Title Pt will negotiate 14 stairs with B rails and min A of single person to indicate increased independence accessing second floor of home. Target date: 11/06/14   Baseline 10/10: required min  guard of PT for stairs x16   Status Achieved           PT Long Term Goals - 10/09/14 2125    PT LONG TERM GOAL #1   Title Pt will improve 6 Minute Walk distance by 150' from baseline to indicate significant improvement in functional endurance. Target date: 12/04/14   PT LONG TERM GOAL #2   Title Pt will improve TUG score by 10.8 seconds from baseline to indicate significant improvement in efficiency of functional mobility.  Target date: 12/04/14   PT LONG TERM GOAL #3   Title Pt will ambulate 500' over unlevel, paved surfaces with mod I using LRAD to indicate increased stability/independence with community mobility. Target date: 12/04/14   PT LONG TERM GOAL #4   Title Pt will negotiate standard ramp and curb step with mod I using LRAD to indicate increased safety/indepedence traversing community obstacles. Target date: 12/04/14   PT  LONG TERM GOAL #5   Title Pt will negotiate 14 stairs with 2 rails and mod I to indicate increased independence accessing second floor of home.  Target date: 12/04/14               Plan - 11/21/14 1908    Clinical Impression Statement When ambulating with [facility], pt no longer exhibits L Trendelenburg and demonstrates significant improvement in L knee control. Pt now mod I for household ambulation with [facility] and supervision for community ambulationwith [facility].   Pt will benefit from skilled therapeutic intervention in order to improve on the following deficits Abnormal gait;Decreased activity tolerance;Decreased balance;Decreased endurance;Decreased coordination;Decreased mobility;Decreased strength;Impaired sensation;Pain   PT Frequency Other (comment)  3x/week for initial 4 weeks; 2x/week for subsequent 4 weeks   PT Duration Other (comment)  See above.   PT Treatment/Interventions Vestibular;ADLs/Self Care Home Management;DME Instruction;Balance training;Therapeutic exercise;Therapeutic activities;Functional mobility training;Stair training;Gait  training;Neuromuscular re-education;Patient/family education   PT Next Visit Plan Core strengthening, L knee control; assess functional return in L ankle and provide home exercise to maximize return, if appropriate.   Consulted and Agree with Plan of Care Patient;Family member/caregiver   Family Member Consulted mother and father        Problem List Patient Active Problem List   Diagnosis Date Noted  . Adjustment disorder with depressed mood   . Tetraplegia ([place]) 09/19/2014  . Acute blood loss anemia 09/19/2014  . Cervical spondylosis with myelopathy 09/18/2014  . Radiculopathy of arm 08/28/2014  . Hypogonadism male 09/08/2013  . 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 Cardiomyopathy 04/01/2011  . Wolff-Parkinson-White (WPW) syndrome 04/01/2011    Billie Ruddy, PT, DPT [facility] [address] [place], [place], [address] Phone: [phone]   Fax:  [phone] 11/21/2014, 7:10 PM   Name: Mark Gallagher MRN: [id number] Date of Birth: Jul 23, 1970

## 2014-11-21 NOTE — Therapy (Signed)
[facility] [address] [place], [place], [address] Phone: [phone]   Fax:  [phone]  Occupational Therapy Treatment  Patient Details  Name: Mark Gallagher MRN: [id number] Date of Birth: 1970/09/12 No Data Recorded  Encounter Date: 11/21/2014      OT End of Session - 11/21/14 1520    Visit Number 13   Number of Visits 17   Date for OT Re-Evaluation 12/08/14   Authorization Type Aetna, 60 visit limit combined   OT Start Time 1450   OT Stop Time 1530   OT Time Calculation (min) 40 min   Activity Tolerance Patient tolerated treatment well   Behavior During Therapy [facility] for tasks assessed/performed      Past Medical History  Diagnosis Date  . Thyroid disease   . Cancer ([place])   . 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 Testosterone deficiency   . Knee pain, bilateral   . History of pituitary tumor     Past Surgical History  Procedure Laterality Date  . Cervical disc surgery  2007    Anterior C4/5 vertebrectomy and c3-C6 laminoplasty with plating  . Craniotomy for tumor      pituitary adenoma    There were no vitals filed for this visit.  Visit Diagnosis:  Left hand weakness  Decreased coordination  Muscle weakness (generalized)      Subjective Assessment - 11/21/14 1452    Pertinent History C3-T2 fusion 09/10/14 with cardiac surgery after surgery, hx of previous C1-C6 fusion (2007)   Patient Stated Goals return to work   Currently in Pain? Yes   Pain Score 2    Pain Location Neck   Pain Type Surgical pain   Pain Onset More than a month ago   Pain Frequency Intermittent   Aggravating Factors  malpositioning   Pain Relieving Factors repositioning       Treatment: Pt performed simple sandwich prep to retrieve items for cold sandwich and assemble in standing with supervision and no LOB.(pt reports making  A sandwich at home) Fine motor coordination task to place grooved pegs in pegboard with LUE then  removing with emphasis on in hand manipulation and translation, min-mod difficulty, min v.c Arm bike x 6 mins level 1 for conditioning.                          OT Short Term Goals - 11/06/14 1417    OT SHORT TERM GOAL #1   Title Pt will verbalize understanding of coordination/hand strength HEP--check STGs 11/08/14   Time 4   Period Weeks   Status Achieved   OT SHORT TERM GOAL #2   Title Pt will perfom BADLs mod I.   Baseline min A bathing   Time 4   Period Weeks   Status On-going  11/06/14:  supervision for shower transfer, min A for R brace/neck brace   OT SHORT TERM GOAL #3   Title Pt will improve L hand coordination for ADLs as shown by completing 9-hole peg test in less than 55sec.   Baseline 77.34sec   Time 4   Period Weeks   Status On-going  11/06/14:  56.94sec   OT SHORT TERM GOAL #4   Title Pt will demo at least 40lbs L grip strength for ADLs/opening containers.   Baseline 30lbs   Time 4   Period Weeks   Status Achieved  11/06/14:  45lbs  OT Long Term Goals - 11/21/14 1508    OT LONG TERM GOAL #1   Title Pt will be independent with proximal UE HEP when cleared from cervical/lifting precautions.--check LTGs 12/09/14   Status On-going   OT LONG TERM GOAL #2   Title Pt will perform simple meal prep/home maintenance tasks mod I.   Status On-going   OT LONG TERM GOAL #3   Title Pt will improve L hand coordination for ADLs as shown by completing 9-hole peg test in less than 45sec.   Baseline 47.88secs  11/21/14   Status On-going   OT LONG TERM GOAL #4   Title Pt will demo at least 55lbs L grip strength for ADLs/opening containers.   Status On-going               Plan - 11/21/14 1514    Clinical Impression Statement Pt is progressing towards goals with improving coordiantion, strength and endurance.   Pt will benefit from skilled therapeutic intervention in order to improve on the following deficits (Retired) Decreased  activity tolerance;Decreased balance;Decreased mobility;Decreased strength;Pain;Impaired UE functional use;Decreased knowledge of use of DME;Decreased coordination   Rehab Potential Good   OT Frequency 2x / week   OT Duration 8 weeks   OT Treatment/Interventions Passive range of motion;Moist Heat;Cryotherapy;DME and/or AE instruction;Therapeutic activities;Energy conservation;Therapeutic exercises;Neuromuscular education;Manual Therapy;Self-care/ADL training;Therapeutic exercise;Functional Mobility Training;Patient/family education   Plan coordiantion, endurance    OT Home Exercise Plan Education issued: coordination 10/12/14, cane HEP and yellow band (bicep/triceps)   Consulted and Agree with Plan of Care Patient;Family member/caregiver   Family Member Consulted mother, father        Problem List Patient Active Problem List   Diagnosis Date Noted  . Adjustment disorder with depressed mood   . Tetraplegia ([place]) 09/19/2014  . Acute blood loss anemia 09/19/2014  . Cervical spondylosis with myelopathy 09/18/2014  . Radiculopathy of arm 08/28/2014  . Hypogonadism male 09/08/2013  . 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 Cardiomyopathy 04/01/2011  . Wolff-Parkinson-White (WPW) syndrome 04/01/2011    RINE,KATHRYN 11/21/2014, 3:21 PM Theone Murdoch, OTR/L [phone] Phone: [phone] 3:21 PM 11/21/2014 [facility] [address] [place], [place], [address] Phone: [phone]   Fax:  [phone]  Name: Mark Gallagher MRN: [id number] Date of Birth: [birth year]

## 2014-11-23 ENCOUNTER — Ambulatory Visit: Payer: Managed Care, Other (non HMO) | Admitting: Occupational Therapy

## 2014-11-23 ENCOUNTER — Ambulatory Visit: Payer: Managed Care, Other (non HMO) | Admitting: Physical Therapy

## 2014-11-23 DIAGNOSIS — R29898 Other symptoms and signs involving the musculoskeletal system: Secondary | ICD-10-CM

## 2014-11-23 DIAGNOSIS — R531 Weakness: Secondary | ICD-10-CM

## 2014-11-23 DIAGNOSIS — R269 Unspecified abnormalities of gait and mobility: Secondary | ICD-10-CM

## 2014-11-23 DIAGNOSIS — M6281 Muscle weakness (generalized): Secondary | ICD-10-CM

## 2014-11-23 DIAGNOSIS — R278 Other lack of coordination: Secondary | ICD-10-CM

## 2014-11-23 DIAGNOSIS — M6289 Other specified disorders of muscle: Secondary | ICD-10-CM | POA: Diagnosis not present

## 2014-11-23 DIAGNOSIS — M21372 Foot drop, left foot: Secondary | ICD-10-CM

## 2014-11-23 DIAGNOSIS — R2681 Unsteadiness on feet: Secondary | ICD-10-CM

## 2014-11-23 DIAGNOSIS — R279 Unspecified lack of coordination: Principal | ICD-10-CM

## 2014-11-23 NOTE — Therapy (Signed)
[facility] [address] [place], [place], [address] Phone: [phone]   Fax:  [phone]  Occupational Therapy Treatment  Patient Details  Name: Mark Gallagher MRN: [id number] Date of Birth: 03-19-1970 No Data Recorded  Encounter Date: 11/23/2014      OT End of Session - 11/23/14 1153    Visit Number 15   Date for OT Re-Evaluation 12/08/14   Authorization Type Aetna, 60 visit limit combined   OT Start Time 1110   OT Stop Time 1150   OT Time Calculation (min) 40 min   Activity Tolerance Patient tolerated treatment well   Behavior During Therapy [facility] for tasks assessed/performed      Past Medical History  Diagnosis Date  . Thyroid disease   . Cancer ([place])   . 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 Testosterone deficiency   . Knee pain, bilateral   . History of pituitary tumor     Past Surgical History  Procedure Laterality Date  . Cervical disc surgery  2007    Anterior C4/5 vertebrectomy and c3-C6 laminoplasty with plating  . Craniotomy for tumor      pituitary adenoma    There were no vitals filed for this visit.  Visit Diagnosis:  Decreased coordination  Left hand weakness  Muscle weakness (generalized)      Subjective Assessment - 11/23/14 1129    Subjective  Pt reports making sandwiches at home   Pertinent History C3-T2 fusion 09/10/14 with cardiac surgery after surgery, hx of previous C1-C6 fusion (2007)   Patient Stated Goals return to work   Currently in Pain? Yes   Pain Score 2    Pain Location Neck   Pain Descriptors / Indicators Sore   Pain Type Surgical pain   Pain Onset More than a month ago   Pain Frequency Constant   Aggravating Factors  malpositioning   Pain Relieving Factors reposistioning   Multiple Pain Sites No       Treatment: Standing to copy small peg design with LUE at midrange, mod drops, and difficulty for fine motor coordination. Pt stood for approximately 25 mins without  rest break. Gripper set at 55 lbs to pick up 1" blocks with LUE, min difficulty/ drops. Arm bike level 4 for 8 mins for conditioning, no adverse reactions.                         OT Short Term Goals - 11/06/14 1417    OT SHORT TERM GOAL #1   Title Pt will verbalize understanding of coordination/hand strength HEP--check STGs 11/08/14   Time 4   Period Weeks   Status Achieved   OT SHORT TERM GOAL #2   Title Pt will perfom BADLs mod I.   Baseline min A bathing   Time 4   Period Weeks   Status On-going  11/06/14:  supervision for shower transfer, min A for R brace/neck brace   OT SHORT TERM GOAL #3   Title Pt will improve L hand coordination for ADLs as shown by completing 9-hole peg test in less than 55sec.   Baseline 77.34sec   Time 4   Period Weeks   Status On-going  11/06/14:  56.94sec   OT SHORT TERM GOAL #4   Title Pt will demo at least 40lbs L grip strength for ADLs/opening containers.   Baseline 30lbs   Time 4   Period Weeks   Status Achieved  11/06/14:  45lbs  OT Long Term Goals - 11/21/14 1508    OT LONG TERM GOAL #1   Title Pt will be independent with proximal UE HEP when cleared from cervical/lifting precautions.--check LTGs 12/09/14   Status On-going   OT LONG TERM GOAL #2   Title Pt will perform simple meal prep/home maintenance tasks mod I.   Status On-going   OT LONG TERM GOAL #3   Title Pt will improve L hand coordination for ADLs as shown by completing 9-hole peg test in less than 45sec.   Baseline 47.88secs  11/21/14   Status On-going   OT LONG TERM GOAL #4   Title Pt will demo at least 55lbs L grip strength for ADLs/opening containers.   Status On-going               Plan - 11/23/14 1130    Clinical Impression Statement Pt is progressing towards goals for endurance, strength and coordination.   Pt will benefit from skilled therapeutic intervention in order to improve on the following deficits (Retired)  Decreased activity tolerance;Decreased balance;Decreased mobility;Decreased strength;Pain;Impaired UE functional use;Decreased knowledge of use of DME;Decreased coordination   Rehab Potential Good   OT Frequency 2x / week   OT Duration 8 weeks   OT Treatment/Interventions Passive range of motion;Moist Heat;Cryotherapy;DME and/or AE instruction;Therapeutic activities;Energy conservation;Therapeutic exercises;Neuromuscular education;Manual Therapy;Self-care/ADL training;Therapeutic exercise;Functional Mobility Training;Patient/family education   Plan coordiantion, strength, endurance   OT Home Exercise Plan Education issued: coordination 10/12/14, cane HEP and yellow band (bicep/triceps)   Consulted and Agree with Plan of Care Patient;Family member/caregiver   Family Member Consulted mother, father        Problem List Patient Active Problem List   Diagnosis Date Noted  . Adjustment disorder with depressed mood   . Tetraplegia ([place]) 09/19/2014  . Acute blood loss anemia 09/19/2014  . Cervical spondylosis with myelopathy 09/18/2014  . Radiculopathy of arm 08/28/2014  . Hypogonadism male 09/08/2013  . 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 Cardiomyopathy 04/01/2011  . Wolff-Parkinson-White (WPW) syndrome 04/01/2011    RINE,KATHRYN 11/23/2014, 11:54 AM Theone Murdoch, OTR/L [phone] Phone: [phone] 11:54 AM 11/23/2014 [facility] [address] [place], [place], [address] Phone: [phone]   Fax:  [phone]  Name: IBAN UTZ MRN: [id number] Date of Birth: [birth year]

## 2014-11-23 NOTE — Therapy (Signed)
[facility] [address] [place], [place], [address] Phone: [phone]   Fax:  [phone]  Physical Therapy Treatment  Patient Details  Name: Mark Gallagher MRN: [id number] Date of Birth: 01-12-1971 No Data Recorded  Encounter Date: 11/23/2014      PT End of Session - 11/23/14 1947    Visit Number 18   Number of Visits 21   Date for PT Re-Evaluation 12/08/14   Authorization Type Aetna    PT Start Time 1011   PT Stop Time 1100   PT Time Calculation (min) 49 min   Equipment Utilized During Treatment Cervical collar;Gait belt   Activity Tolerance Patient tolerated treatment well   Behavior During Therapy [facility] for tasks assessed/performed      Past Medical History  Diagnosis Date  . Thyroid disease   . Cancer ([place])   . 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 Testosterone deficiency   . Knee pain, bilateral   . History of pituitary tumor     Past Surgical History  Procedure Laterality Date  . Cervical disc surgery  2007    Anterior C4/5 vertebrectomy and c3-C6 laminoplasty with plating  . Craniotomy for tumor      pituitary adenoma    There were no vitals filed for this visit.  Visit Diagnosis:  Abnormality of gait  Unsteadiness  Foot drop, left  Weakness generalized      Subjective Assessment - 11/23/14 1017    Subjective "I'm having some trouble walking over the unlevel surface outside my place with the cane." Pt/mother report pt is now going up/down stairs without assistance/supervision. Reports parents left him home alone for the first time; was home alone for 3 hours with no issues.   Patient is accompained by: Family member  mother   Pertinent History Cervical precautions. Hard cervical collar at all times. PMH: cervical spondylosis with myelopathy; s/p cervical vertebrectomy C4/5 with C3-6 plating 2007 acromegaly s/p pituitary tumor resection (1994); hypogonadism; Wolff-Parkinson-White Syndrome;  cardiomyopathy; acute blood loss anemia; adjustment disorder with depressed mood   Patient Stated Goals "I'd like to be able to walk by myself with no assistance, stand up out of a chair by myself, be able to get up and down stairs without assistance.   Currently in Pain? Yes   Pain Score 2    Pain Location Neck   Pain Orientation Mid   Pain Descriptors / Indicators Sore   Pain Type Surgical pain   Pain Onset More than a month ago   Pain Frequency Constant   Aggravating Factors  malpositioning   Pain Relieving Factors repositioning   Effect of Pain on Daily Activities PT will monitor but will not directly address due to nature of surgical pain.   Multiple Pain Sites No            [facility] PT Assessment - 11/23/14 0001    Strength   Overall Strength Comments Noted trace L ankle eversion.                     [place] Adult PT Treatment/Exercise - 11/23/14 0001    Transfers   Transfers Sit to Stand;Stand to Sit   Sit to Stand 6: Modified independent (Device/Increase time)   Stand to Sit 6: Modified independent (Device/Increase time)   Ambulation/Gait   Ambulation/Gait Yes   Ambulation/Gait Assistance 5: Supervision;6: Modified independent (Device/Increase time)   Ambulation/Gait Assistance Details Using [facility], pt mod I indoors and supervision outdoors.  Ambulation Distance (Feet) 700 Feet  consecutively   Assistive device Large base quad cane   Gait Pattern Step-through pattern;Decreased hip/knee flexion - left;Decreased weight shift to left;Left flexed knee in stance;Narrow base of support;Lateral trunk lean to right;Poor foot clearance - left;Trendelenburg  L Trendelenburg noted without AD only   Ambulation Surface Level;Unlevel;Indoor;Outdoor;Paved   Ramp 5: Supervision   Ramp Details (indicate cue type and reason) with [facility]   Curb 5: Supervision   Curb Details (indicate cue type and reason) using [facility]   Gait Comments Performed gait training with NMES on L peroneals  to increase subtalar eversion during gait training. Noted consistent LLE clearance while traversing a variety of surfaces. Per pt report of having difficulty stepping up onto "banked curb" where pt checks mail, negotiated curb which was slightly banked to R. Pt required mod A to negotiate curb due to pine straw and downward slope to R side.   Exercises   Exercises --   Other Exercises  --                  PT Short Term Goals - 11/01/14 1657    PT SHORT TERM GOAL #1   Title Pt will perform initial HEP with mod I using paper handout to indicate safe HEP compliance, maximize functional gains made in PT. Target date: 11/06/14   Baseline Met 9/22.   Status Achieved   PT SHORT TERM GOAL #2   Title Perform 6 Minute Walk Test to assess/address functional endurance. Target date: 11/06/14   Baseline 9/20: 556' with RW   Status Achieved   PT SHORT TERM GOAL #3   Title Perform TUG to assess/address efficiency of functional mobility. Target date: 11/06/14   Baseline 9/20: TUG time = 28.3 seconds with RW   Status Achieved   PT SHORT TERM GOAL #4   Title Pt will ambulate 200' over level, indoor surfaces with mod I using LRAD to indicate increased safety/indepedence with household ambulation. Target date: 11/06/14   Baseline Met 10/12 with bariatric RW.   Status Achieved   PT SHORT TERM GOAL #5   Title Pt will negotiate 14 stairs with B rails and min A of single person to indicate increased independence accessing second floor of home. Target date: 11/06/14   Baseline 10/10: required min guard of PT for stairs x16   Status Achieved           PT Long Term Goals - 10/09/14 2125    PT LONG TERM GOAL #1   Title Pt will improve 6 Minute Walk distance by 150' from baseline to indicate significant improvement in functional endurance. Target date: 12/04/14   PT LONG TERM GOAL #2   Title Pt will improve TUG score by 10.8 seconds from baseline to indicate significant improvement in efficiency of  functional mobility.  Target date: 12/04/14   PT LONG TERM GOAL #3   Title Pt will ambulate 500' over unlevel, paved surfaces with mod I using LRAD to indicate increased stability/independence with community mobility. Target date: 12/04/14   PT LONG TERM GOAL #4   Title Pt will negotiate standard ramp and curb step with mod I using LRAD to indicate increased safety/indepedence traversing community obstacles. Target date: 12/04/14   PT LONG TERM GOAL #5   Title Pt will negotiate 14 stairs with 2 rails and mod I to indicate increased independence accessing second floor of home.  Target date: 12/04/14  Plan - 11/23/14 1948    Clinical Impression Statement Skilled session focused on gait training over unlevel, outdoor surfaces with [facility]. Pt continues to require supervision for gait over unlevel, outdoor surfaces using [facility]. Noted activation of L peroneals during this session. Therefore, utilized NMES on L peroneals during gait training to promote functional return in L ankle.    Pt will benefit from skilled therapeutic intervention in order to improve on the following deficits Abnormal gait;Decreased activity tolerance;Decreased balance;Decreased endurance;Decreased coordination;Decreased mobility;Decreased strength;Impaired sensation;Pain   Rehab Potential Good   PT Frequency Other (comment)  3x/week for initial 4 weeks; 2x/week for subsequent 4 weeks   PT Duration Other (comment)  See above.   PT Treatment/Interventions Vestibular;ADLs/Self Care Home Management;DME Instruction;Balance training;Therapeutic exercise;Therapeutic activities;Functional mobility training;Stair training;Gait training;Neuromuscular re-education;Patient/family education   PT Next Visit Plan Assess stairs; core strengthening, L knee control; NMES at L ankle (DF, eversion) during functional mobility training.   Consulted and Agree with Plan of Care Patient;Family member/caregiver   Family Member Consulted  mother        Problem List Patient Active Problem List   Diagnosis Date Noted  . Adjustment disorder with depressed mood   . Tetraplegia ([place]) 09/19/2014  . Acute blood loss anemia 09/19/2014  . Cervical spondylosis with myelopathy 09/18/2014  . Radiculopathy of arm 08/28/2014  . Hypogonadism male 09/08/2013  . 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 Cardiomyopathy 04/01/2011  . Wolff-Parkinson-White (WPW) syndrome 04/01/2011    Billie Ruddy, PT, DPT [facility] [address] [place], [place], [address] Phone: [phone]   Fax:  [phone] 11/23/2014, 7:59 PM   Name: Mark Gallagher MRN: [id number] Date of Birth: 12/20/1970

## 2014-11-27 ENCOUNTER — Ambulatory Visit: Payer: Managed Care, Other (non HMO) | Admitting: Physical Therapy

## 2014-11-27 ENCOUNTER — Ambulatory Visit: Payer: Managed Care, Other (non HMO) | Admitting: Occupational Therapy

## 2014-11-27 DIAGNOSIS — R531 Weakness: Secondary | ICD-10-CM

## 2014-11-27 DIAGNOSIS — R29898 Other symptoms and signs involving the musculoskeletal system: Secondary | ICD-10-CM

## 2014-11-27 DIAGNOSIS — R2681 Unsteadiness on feet: Secondary | ICD-10-CM

## 2014-11-27 DIAGNOSIS — M6289 Other specified disorders of muscle: Secondary | ICD-10-CM | POA: Diagnosis not present

## 2014-11-27 DIAGNOSIS — R279 Unspecified lack of coordination: Secondary | ICD-10-CM

## 2014-11-27 DIAGNOSIS — R278 Other lack of coordination: Secondary | ICD-10-CM

## 2014-11-27 DIAGNOSIS — M21372 Foot drop, left foot: Secondary | ICD-10-CM

## 2014-11-27 DIAGNOSIS — R269 Unspecified abnormalities of gait and mobility: Secondary | ICD-10-CM

## 2014-11-27 NOTE — Therapy (Signed)
[facility] [address] [place], [place], [address] Phone: [phone]   Fax:  [phone]  Physical Therapy Treatment  Patient Details  Name: Mark Gallagher MRN: [id number] Date of Birth: January 21, 1970 No Data Recorded  Encounter Date: 11/27/2014      PT End of Session - 11/27/14 1958    Visit Number 19   Number of Visits 21   Date for PT Re-Evaluation 12/08/14   Authorization Type Aetna    PT Start Time 1400   PT Stop Time 1445   PT Time Calculation (min) 45 min   Equipment Utilized During Treatment Cervical collar   Activity Tolerance Patient tolerated treatment well   Behavior During Therapy [facility] for tasks assessed/performed      Past Medical History  Diagnosis Date  . Thyroid disease   . Cancer ([place])   . 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 Testosterone deficiency   . Knee pain, bilateral   . History of pituitary tumor     Past Surgical History  Procedure Laterality Date  . Cervical disc surgery  2007    Anterior C4/5 vertebrectomy and c3-C6 laminoplasty with plating  . Craniotomy for tumor      pituitary adenoma    There were no vitals filed for this visit.  Visit Diagnosis:  Abnormality of gait  Weakness generalized  Unsteadiness  Foot drop, left      Subjective Assessment - 11/27/14 2000    Subjective Pt feels as though electrical stimulation at L ankle "really helped" after last session. Pt reporting ability to perform short-distance gait without AD in home.   Patient is accompained by: Family member  mother and father   Pertinent History Cervical precautions. Hard cervical collar at all times. PMH: cervical spondylosis with myelopathy; s/p cervical vertebrectomy C4/5 with C3-6 plating 2007 acromegaly s/p pituitary tumor resection (1994); hypogonadism; Wolff-Parkinson-White Syndrome; cardiomyopathy; acute blood loss anemia; adjustment disorder with depressed mood   Patient Stated Goals "I'd like to be  able to walk by myself with no assistance, stand up out of a chair by myself, be able to get up and down stairs without assistance.   Currently in Pain? Yes   Pain Score 2    Pain Location Neck   Pain Orientation Mid   Pain Descriptors / Indicators Sore   Pain Type Surgical pain   Pain Onset More than a month ago   Pain Frequency Constant   Aggravating Factors  malpositioning   Pain Relieving Factors repositioning   Multiple Pain Sites No                         [facility] Adult PT Treatment/Exercise - 11/27/14 0001    Transfers   Transfers Sit to Stand;Stand to Sit   Sit to Stand 6: Modified independent (Device/Increase time)   Stand to Sit 6: Modified independent (Device/Increase time)   Ambulation/Gait   Ambulation/Gait Yes   Ambulation/Gait Assistance 5: Supervision   Ambulation/Gait Assistance Details Using [facility], pt performed gait x100' over level, indoor surfaces and x550 over unlevel, outdoor surfaces with distant supervision. Without AD, pt performed gait x115' over indoor, level surfaces requiring min guard to min A   Ambulation Distance (Feet) 765 Feet  consecutively   Assistive device Large base quad cane   Gait Pattern Step-through pattern;Decreased hip/knee flexion - left;Decreased weight shift to left;Left flexed knee in stance;Narrow base of support;Lateral trunk lean to right;Poor foot clearance - left;Trendelenburg  L Trendelenburg noted without AD only   Ambulation Surface Level;Unlevel;Indoor;Outdoor;Paved   Stairs Assistance 6: Modified independent (Device/Increase time)   Stairs Assistance Details (indicate cue type and reason) Negotiated initial 4 stairs sideways wth BUE support on L rail; negotiated remaining stairs forward-facing with B rails.   Stair Management Technique One rail Left;Two rails;Step to pattern;Sideways;Backwards   Number of Stairs 16   Height of Stairs 6   Ramp 5: Supervision   Ramp Details (indicate cue type and reason) with  [facility]   Curb 6: Modified independent (Device/increase time);5: Supervision   Curb Details (indicate cue type and reason) using [facility], pt mod I to descend, supervision ascend   Gait Comments Performed gait training with NMES on L peroneals to increase subtalar eversion during gait training.                 PT Education - 11/27/14 1436    Education provided Yes   Education Details Recommend continued use of [facility] for all functional mobility. Recommended (for strengthening) stair negotiation ascending with LLE, descending with RLE with supervision.   Person(s) Educated Patient;Parent(s)   Methods Explanation;Demonstration   Comprehension Verbalized understanding;Returned demonstration          PT Short Term Goals - 11/01/14 1657    PT SHORT TERM GOAL #1   Title Pt will perform initial HEP with mod I using paper handout to indicate safe HEP compliance, maximize functional gains made in PT. Target date: 11/06/14   Baseline Met 9/22.   Status Achieved   PT SHORT TERM GOAL #2   Title Perform 6 Minute Walk Test to assess/address functional endurance. Target date: 11/06/14   Baseline 9/20: 556' with RW   Status Achieved   PT SHORT TERM GOAL #3   Title Perform TUG to assess/address efficiency of functional mobility. Target date: 11/06/14   Baseline 9/20: TUG time = 28.3 seconds with RW   Status Achieved   PT SHORT TERM GOAL #4   Title Pt will ambulate 200' over level, indoor surfaces with mod I using LRAD to indicate increased safety/indepedence with household ambulation. Target date: 11/06/14   Baseline Met 10/12 with bariatric RW.   Status Achieved   PT SHORT TERM GOAL #5   Title Pt will negotiate 14 stairs with B rails and min A of single person to indicate increased independence accessing second floor of home. Target date: 11/06/14   Baseline 10/10: required min guard of PT for stairs x16   Status Achieved           PT Long Term Goals - 11/27/14 1425    PT LONG TERM GOAL  #1   Title Pt will improve 6 Minute Walk distance by 150' from baseline to indicate significant improvement in functional endurance. Target date: 12/04/14   PT LONG TERM GOAL #2   Title Pt will improve TUG score by 10.8 seconds from baseline to indicate significant improvement in efficiency of functional mobility.  Target date: 12/04/14   PT LONG TERM GOAL #3   Title Pt will ambulate 500' over unlevel, paved surfaces with mod I using LRAD to indicate increased stability/independence with community mobility. Target date: 12/04/14   PT LONG TERM GOAL #4   Title Pt will negotiate standard ramp and curb step with mod I using LRAD to indicate increased safety/indepedence traversing community obstacles. Target date: 12/04/14   PT LONG TERM GOAL #5   Title Pt will negotiate 14 stairs with 2 rails and mod I to  indicate increased independence accessing second floor of home.  Target date: 12/04/14   Baseline Met 11/27/14.   Status Achieved               Plan - 11/27/14 2003    Clinical Impression Statement Session focused on continued gait training with [facility] in community environment, without AD in home environment. Gait without AD limited by limited LLE stance stability. LTG #5 met, as pt mod I for stair negotiation during this session.   Pt will benefit from skilled therapeutic intervention in order to improve on the following deficits Abnormal gait;Decreased activity tolerance;Decreased balance;Decreased endurance;Decreased coordination;Decreased mobility;Decreased strength;Impaired sensation;Pain   Rehab Potential Good   PT Frequency Other (comment)  3x/week for initial 4 weeks; 2x/week for subsequent 4 weeks   PT Duration Other (comment)  See above.   PT Treatment/Interventions Vestibular;ADLs/Self Care Home Management;DME Instruction;Balance training;Therapeutic exercise;Therapeutic activities;Functional mobility training;Stair training;Gait training;Neuromuscular re-education;Patient/family  education   PT Next Visit Plan L hip strengthening, core strengthening, L knee control; NMES at L ankle (DF, eversion) during functional mobility training.   Consulted and Agree with Plan of Care Patient;Family member/caregiver   Family Member Consulted mother        Problem List Patient Active Problem List   Diagnosis Date Noted  . Adjustment disorder with depressed mood   . Tetraplegia ([place]) 09/19/2014  . Acute blood loss anemia 09/19/2014  . Cervical spondylosis with myelopathy 09/18/2014  . Radiculopathy of arm 08/28/2014  . Hypogonadism male 09/08/2013  . 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 Cardiomyopathy 04/01/2011  . Wolff-Parkinson-White (WPW) syndrome 04/01/2011    Billie Ruddy, PT, DPT [facility] [address] [place], [place], [address] Phone: [phone]   Fax:  [phone] 11/27/2014, 8:07 PM   Name: BARRETT GOLDIE MRN: [id number] Date of Birth: 05/04/1970

## 2014-11-27 NOTE — Therapy (Signed)
[facility] [address] [place], [place], [address] Phone: [phone]   Fax:  [phone]  Occupational Therapy Treatment  Patient Details  Name: Mark Gallagher MRN: [id number] Date of Birth: 1971/01/08 No Data Recorded  Encounter Date: 11/27/2014      OT End of Session - 11/27/14 1457    Visit Number 16   Number of Visits 17   Date for OT Re-Evaluation 12/08/14   Authorization Type Aetna, 60 visit limit combined   OT Start Time 1455   OT Stop Time 1533   OT Time Calculation (min) 38 min   Activity Tolerance Patient tolerated treatment well   Behavior During Therapy [facility] for tasks assessed/performed      Past Medical History  Diagnosis Date  . Thyroid disease   . Cancer ([place])   . 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)   . Testosterone deficiency   . Knee pain, bilateral   . History of pituitary tumor     Past Surgical History  Procedure Laterality Date  . Cervical disc surgery  2007    Anterior C4/5 vertebrectomy and c3-C6 laminoplasty with plating  . Craniotomy for tumor      pituitary adenoma    There were no vitals filed for this visit.  Visit Diagnosis:  Left hand weakness  Weakness generalized  Decreased coordination  Unsteadiness      Subjective Assessment - 11/27/14 1456    Subjective  Pt reports performing all BADLs mod I    Patient is accompained by: Family member  parents   Pertinent History C3-T2 fusion 09/10/14 with cardiac surgery after surgery, hx of previous C1-C6 fusion (2007)   Patient Stated Goals return to work   Currently in Pain? Yes   Pain Score 2    Pain Location Neck   Pain Descriptors / Indicators Sore   Aggravating Factors  malpositioning   Pain Relieving Factors repositioning                      OT Treatments/Exercises (OP) - 11/27/14 0001    ADLs   ADL Comments Began checking goals and discussing progress--see goals section (now ambulating with quad cane and  performing all BADLs mod I)   Fine Motor Coordination   Purdue Pegboard with min difficulty for coordination   Functional Reaching Activities   Mid Level in standing to place clothespins with 1-8lb resistance on vertical pole with LUE for incr strength and activity tolerance                OT Education - 11/27/14 1525    Education Details Upgraded putty HEP to green   Person(s) Educated Patient;Parent(s)   Methods Explanation;Demonstration   Comprehension Verbalized understanding;Returned demonstration          OT Short Term Goals - 11/27/14 1523    OT SHORT TERM GOAL #1   Title Pt will verbalize understanding of coordination/hand strength HEP--check STGs 11/08/14   Time 4   Period Weeks   Status Achieved   OT SHORT TERM GOAL #2   Title Pt will perfom BADLs mod I.   Baseline min A bathing   Time 4   Period Weeks   Status Achieved  11/06/14  supervision for shower transfer, min A for R brace/neck brace;  11/27/14 met   OT SHORT TERM GOAL #3   Title Pt will improve L hand coordination for ADLs as shown by completing 9-hole peg test in less than 55sec.  Baseline 77.34sec   Time 4   Period Weeks   Status Achieved  11/06/14:  56.94sec;  11/27/14  45.69sec   OT SHORT TERM GOAL #4   Title Pt will demo at least 40lbs L grip strength for ADLs/opening containers.   Baseline 30lbs   Time 4   Period Weeks   Status Achieved  11/06/14:  45lbs           OT Long Term Goals - 11/27/14 1520    OT LONG TERM GOAL #1   Title Pt will be independent with proximal UE HEP when cleared from cervical/lifting precautions.--check LTGs 12/09/14   Status On-going  11/27/14 pt not yet cleared from cervical precautions   OT LONG TERM GOAL #2   Title Pt will perform simple meal prep/home maintenance tasks mod I.   Status On-going   OT LONG TERM GOAL #3   Title Pt will improve L hand coordination for ADLs as shown by completing 9-hole peg test in less than 45sec.   Baseline 47.88secs   11/21/14; 11/27/14  45.69sec   Status On-going   OT LONG TERM GOAL #4   Title Pt will demo at least 55lbs L grip strength for ADLs/opening containers.   Status On-going  11/27/14:  49lbs               Plan - 11/27/14 1507    Clinical Impression Statement Pt is progressing towards goals for ADLs, strength, and coordination.   Plan discuss reducing frequency to 1x week until cervical precautions lifted, renewal   OT Home Exercise Plan Education issued: coordination 10/12/14, cane HEP and yellow band (bicep/triceps)   Consulted and Agree with Plan of Care Patient   Family Member Consulted mother, father        Problem List Patient Active Problem List   Diagnosis Date Noted  . Adjustment disorder with depressed mood   . Tetraplegia ([place]) 09/19/2014  . Acute blood loss anemia 09/19/2014  . Cervical spondylosis with myelopathy 09/18/2014  . Radiculopathy of arm 08/28/2014  . Hypogonadism male 09/08/2013  . 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 Cardiomyopathy 04/01/2011  . Wolff-Parkinson-White (WPW) syndrome 04/01/2011    [facility] 11/27/2014, 4:58 PM  [facility] [address] [place], [place], [address] Phone: [phone]   Fax:  [phone]  Name: JOSUA FERREBEE MRN: [id number] Date of Birth: Nov 25, 1970  Vianne Bulls, OTR/L 11/27/2014 4:58 PM

## 2014-11-29 ENCOUNTER — Telehealth: Payer: Self-pay | Admitting: Physical Medicine & Rehabilitation

## 2014-11-29 NOTE — Telephone Encounter (Signed)
Rexene Alberts with Human Resources with [employer] needs clarification on patiens job restrictions.  She state Dr. Posey Pronto filled out some paperwork on this patient.  Please call her at [phone].

## 2014-11-30 ENCOUNTER — Ambulatory Visit (INDEPENDENT_AMBULATORY_CARE_PROVIDER_SITE_OTHER): Payer: Managed Care, Other (non HMO) | Admitting: *Deleted

## 2014-11-30 ENCOUNTER — Ambulatory Visit: Payer: Managed Care, Other (non HMO) | Admitting: Physical Therapy

## 2014-11-30 ENCOUNTER — Ambulatory Visit: Payer: Managed Care, Other (non HMO) | Admitting: Occupational Therapy

## 2014-11-30 DIAGNOSIS — R278 Other lack of coordination: Secondary | ICD-10-CM

## 2014-11-30 DIAGNOSIS — R29898 Other symptoms and signs involving the musculoskeletal system: Secondary | ICD-10-CM

## 2014-11-30 DIAGNOSIS — E291 Testicular hypofunction: Secondary | ICD-10-CM | POA: Diagnosis not present

## 2014-11-30 DIAGNOSIS — E220 Acromegaly and pituitary gigantism: Secondary | ICD-10-CM

## 2014-11-30 DIAGNOSIS — R531 Weakness: Secondary | ICD-10-CM

## 2014-11-30 DIAGNOSIS — R269 Unspecified abnormalities of gait and mobility: Secondary | ICD-10-CM

## 2014-11-30 DIAGNOSIS — M6289 Other specified disorders of muscle: Secondary | ICD-10-CM | POA: Diagnosis not present

## 2014-11-30 DIAGNOSIS — M21372 Foot drop, left foot: Secondary | ICD-10-CM

## 2014-11-30 DIAGNOSIS — R2681 Unsteadiness on feet: Secondary | ICD-10-CM

## 2014-11-30 DIAGNOSIS — R279 Unspecified lack of coordination: Secondary | ICD-10-CM

## 2014-11-30 MED ORDER — OCTREOTIDE ACETATE 30 MG IM KIT
30.0000 mg | PACK | Freq: Once | INTRAMUSCULAR | Status: AC
  Administered 2014-11-30: 30 mg via INTRAMUSCULAR

## 2014-11-30 NOTE — Therapy (Signed)
[facility] [address] [employer], [place], [address] Phone: [phone]   Fax:  [phone]  Occupational Therapy Treatment  Patient Details  Name: Mark Gallagher MRN: [id number] Date of Birth: [birth year] No Data Recorded  Encounter Date: 11/30/2014      OT End of Session - 11/30/14 1413    Visit Number 17   Number of Visits 17   Date for OT Re-Evaluation 12/08/14   Authorization Type Aetna, 60 visit limit combined   OT Start Time 1400   OT Stop Time 1445   OT Time Calculation (min) 45 min   Activity Tolerance Patient tolerated treatment well   Behavior During Therapy [facility] for tasks assessed/performed      Past Medical History  Diagnosis Date  . Thyroid disease   . Cancer ([place])   . 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 Testosterone deficiency   . Knee pain, bilateral   . History of pituitary tumor     Past Surgical History  Procedure Laterality Date  . Cervical disc surgery  2007    Anterior C4/5 vertebrectomy and c3-C6 laminoplasty with plating  . Craniotomy for tumor      pituitary adenoma    There were no vitals filed for this visit.  Visit Diagnosis:  Weakness generalized  Left hand weakness  Decreased coordination  Unsteadiness      Subjective Assessment - 11/30/14 1407    Subjective  Pt reports that insurance may end 12/15/14   Patient is accompained by: Family member   Pertinent History C3-T2 fusion 09/10/14 with cardiac surgery after surgery, hx of previous C1-C6 fusion (2007)   Patient Stated Goals return to work   Currently in Pain? Yes   Pain Score 1    Pain Location Neck   Pain Descriptors / Indicators Sore   Pain Type Surgical pain   Pain Onset More than a month ago   Pain Frequency Constant   Aggravating Factors  malpositioning   Pain Relieving Factors repositioning                      OT Treatments/Exercises (OP) - 11/30/14 0001    ADLs   Functional Mobility Dynamic  functional step and reaching outside base of support to retrieve/replace items in shelf at mid-level without LOB (supervision)   Hand Exercises   Other Hand Exercises Picking up blocks using 55lbs sustained grip strength with LUE with min difficulty/drops   Fine Motor Coordination   Fine Motor Coordination Grooved pegs   Grooved pegs with min difficulty for incr coordination/in-hand manipulation   Functional Reaching Activities   Mid Level In standing, functional reaching to place small pegs in vertical pegboard to copy design with 1lb wt on L wrist for incr strength/endurance and coordination.     Checked goals and discussed progress.  Discussed ? Reducing to 1x week until cervical precautions lifted to conserve visits, but pt reports insurance may end 12/15/14 so he wants to continue at 2x week at this time.             OT Short Term Goals - 11/30/14 1722    OT SHORT TERM GOAL #1   Title Pt will verbalize understanding of coordination/hand strength HEP--check STGs 11/08/14   Time 4   Period Weeks   Status Achieved   OT SHORT TERM GOAL #2   Title Pt will perfom BADLs mod I.   Baseline min A bathing   Time 4  Period Weeks   Status Achieved  11/06/14  supervision for shower transfer, min A for R brace/neck brace;  11/27/14 met   OT SHORT TERM GOAL #3   Title Pt will improve L hand coordination for ADLs as shown by completing 9-hole peg test in less than 55sec.   Baseline 77.34sec   Time 4   Period Weeks   Status Achieved  11/06/14:  56.94sec;  11/27/14  45.69sec   OT SHORT TERM GOAL #4   Title Pt will demo at least 40lbs L grip strength for ADLs/opening containers.   Baseline 30lbs   Time 4   Period Weeks   Status Achieved  11/06/14:  45lbs   OT SHORT TERM GOAL #5   Title Pt will improve L hand coordination for ADLs as shown by completing 9-hole peg test in less than 40sec.--check 12/29/14   Time 4   Period Weeks   Status New   Additional Short Term Goals    Additional Short Term Goals Yes   OT SHORT TERM GOAL #6   Title Pt will be able to perform overhead reaching with LUE for at least 19mn without rest break for incr activity tolerance.--check 12/29/14   Time 4   Period Weeks   Status New   OT SHORT TERM GOAL #7   Title Pt will demo at least 55lbs L grip strength for ADLs/opening containers.--check 12/29/14   Time 4   Period Weeks   Status New           OT Long Term Goals - 11/30/14 1721    OT LONG TERM GOAL #1   Title Pt will be independent with proximal UE HEP when cleared from cervical/lifting precautions.--check updated LTGs 01/29/15   Status On-going  11/27/14 pt not yet cleared from cervical precautions   OT LONG TERM GOAL #2   Title Pt will perform simple meal prep/home maintenance tasks mod I.   Status On-going  not fully met 11/30/14   OT LONG TERM GOAL #3   Title Pt will improve L hand coordination for ADLs as shown by completing 9-hole peg test in less than 37sec.   Baseline 47.88secs  11/21/14; 11/27/14  45.69sec  (revised goal to <37 instead of less than 45sec for renewal)   Status Revised   OT LONG TERM GOAL #4   Title Pt will demo at least 60lbs L grip strength for ADLs/opening containers.   Baseline revised to 60 for renewal 11/30/14   Status Revised  11/27/14:  49lbs   OT LONG TERM GOAL #5   Title Pt will be able to retrieve 4lb object from overhead shelf with LUE.   Time 8   Period Weeks   Status New               Plan - 11/30/14 1733    Clinical Impression Statement Pt is progressing towards goals but is limited by cervical precautions (anticipate that they will be d/c 12/20/14 per pt).  Pt would benefit from continued occupational therapy to improve ADL/IADL performance, dominant LUE functional use to improve quality of life and in prep for return to work.   Pt will benefit from skilled therapeutic intervention in order to improve on the following deficits (Retired) Decreased activity  tolerance;Decreased balance;Decreased mobility;Decreased strength;Pain;Impaired UE functional use;Decreased knowledge of use of DME;Decreased coordination   Rehab Potential Good   OT Frequency 2x / week   OT Duration 8 weeks   OT Treatment/Interventions Passive range of motion;Moist Heat;Cryotherapy;DME and/or  AE instruction;Therapeutic activities;Energy conservation;Therapeutic exercises;Neuromuscular education;Manual Therapy;Self-care/ADL training;Therapeutic exercise;Functional Mobility Training;Patient/family education   Plan continue at 2x week (as insurance may end 12/15/14), continue with LUE strengthening as able (with precautions), IADLs, coordination   OT Home Exercise Plan Education issued: coordination 10/12/14, cane HEP and yellow band (bicep/triceps)   Consulted and Agree with Plan of Care Patient;Family member/caregiver   Family Member Consulted mother, father        Problem List Patient Active Problem List   Diagnosis Date Noted  . Adjustment disorder with depressed mood   . Tetraplegia ([place]) 09/19/2014  . Acute blood loss anemia 09/19/2014  . Cervical spondylosis with myelopathy 09/18/2014  . Radiculopathy of arm 08/28/2014  . Hypogonadism male 09/08/2013  . 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 Cardiomyopathy 04/01/2011  . Wolff-Parkinson-White (WPW) syndrome 04/01/2011    [facility] 11/30/2014, 5:43 PM  [facility] [address] [place], [place], [address] Phone: [phone]   Fax:  [phone]  Name: Mark Gallagher MRN: [id number] Date of Birth: 1970/10/21  Vianne Bulls, OTR/L 11/30/2014 5:43 PM

## 2014-11-30 NOTE — Therapy (Signed)
[facility] [address] [place], [place], [address] Phone: [phone]   Fax:  [phone]  Physical Therapy Treatment  Patient Details  Name: Mark Gallagher MRN: [id number] Date of Birth: 04/12/1970 No Data Recorded  Encounter Date: 11/30/2014      PT End of Session - 11/30/14 1627    Visit Number 20   Number of Visits 21   Date for PT Re-Evaluation 12/08/14   Authorization Type Aetna    PT Start Time 1447   PT Stop Time 1541   PT Time Calculation (min) 54 min   Equipment Utilized During Treatment Cervical collar   Activity Tolerance Patient tolerated treatment well   Behavior During Therapy [facility] for tasks assessed/performed      Past Medical History  Diagnosis Date  . Thyroid disease   . Cancer ([place])   . 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 Testosterone deficiency   . Knee pain, bilateral   . History of pituitary tumor     Past Surgical History  Procedure Laterality Date  . Cervical disc surgery  2007    Anterior C4/5 vertebrectomy and c3-C6 laminoplasty with plating  . Craniotomy for tumor      pituitary adenoma    There were no vitals filed for this visit.  Visit Diagnosis:  Abnormality of gait  Unsteadiness  Foot drop, left  Weakness generalized      Subjective Assessment - 11/30/14 1453    Subjective Pt reports no significant changes, no falls, no dizziness.    Pertinent History Cervical precautions. Hard cervical collar at all times. PMH: cervical spondylosis with myelopathy; s/p cervical vertebrectomy C4/5 with C3-6 plating 2007 acromegaly s/p pituitary tumor resection (1994); hypogonadism; Wolff-Parkinson-White Syndrome; cardiomyopathy; acute blood loss anemia; adjustment disorder with depressed mood   Patient Stated Goals "I'd like to be able to walk by myself with no assistance, stand up out of a chair by myself, be able to get up and down stairs without assistance.   Currently in Pain? Yes   Pain Score 2    Pain Location Neck   Pain Orientation Mid   Pain Descriptors / Indicators Sore   Pain Type Surgical pain   Pain Onset More than a month ago   Pain Frequency Constant   Aggravating Factors  malpositioning   Pain Relieving Factors repositioning   Multiple Pain Sites No                         [facility] Adult PT Treatment/Exercise - 11/30/14 1604    Transfers   Transfers Sit to Stand;Stand to Sit   Sit to Stand 6: Modified independent (Device/Increase time)   Stand to Sit 6: Modified independent (Device/Increase time)   Ambulation/Gait   Ambulation/Gait Yes   Ambulation/Gait Assistance 6: Modified independent (Device/Increase time);4: Min guard;4: Min assist   Ambulation/Gait Assistance Details When using LBQC, pt mod I for indoor, level surfaces and unlevel, paved surfaces. Without AD, pt required min guard-min A.   Ambulation Distance (Feet) 1000 Feet  x700' outdoors, x300 indoors   Assistive device Large base quad cane   Gait Pattern Step-through pattern;Decreased hip/knee flexion - left;Decreased weight shift to left;Left flexed knee in stance;Lateral trunk lean to right;Trendelenburg  L Trendelenburg noted without AD only   Ambulation Surface Level;Unlevel;Indoor;Outdoor;Paved  L Trendelenberg and R trunk lean only noted without AD   Stairs Assistance --   Stair Management Technique --   Number of Stairs --  Height of Stairs --   Ramp 6: Modified independent (Device)   Ramp Details (indicate cue type and reason) with LBQC, L AFO, and L hinged knee brace   Curb 6: Modified independent (Device/increase time);5: Supervision   Curb Details (indicate cue type and reason) with LBQC, L AFO, and L hinged knee brace   Gait Comments Performed gait training with NMES on L tibialis anterior and L peroneals to increase L ankle dorsifleixon and subtalar eversion during gait training.    Neuro Re-ed    Neuro Re-ed Details  With NMES at L peroneals and L tibialis  anterior, performed AAROM of the following with manual assist: L ankle DF x15 reps, L subtalar eversion x15 reps, and concurrent L ankle DF/eversion x20 reps.   Exercises   Exercises Other Exercises   Other Exercises  Seated on mat table with LLE supported on chair in front of pt (to enable pt to better visualize L ankle), pt performed L ankle DF/eversion AAROM  x30 reps with self-assist using sheet; cueing focused on ensuring neutral ankle position due to pt tendency toward L forefoot supination. With explanation, demo from this PT, pt effectively performed L plantarflexor self-stretch using sheet 4 x 30-sec holds.                PT Education - 11/30/14 1620    Education provided Yes   Education Details HEP for ankle stretching/strengthening; see Pt Instructions.   Person(s) Educated Patient;Parent(s)   Methods Explanation;Demonstration;Tactile cues;Verbal cues;Handout   Comprehension Verbalized understanding;Returned demonstration          PT Short Term Goals - 11/01/14 1657    PT SHORT TERM GOAL #1   Title Pt will perform initial HEP with mod I using paper handout to indicate safe HEP compliance, maximize functional gains made in PT. Target date: 11/06/14   Baseline Met 9/22.   Status Achieved   PT SHORT TERM GOAL #2   Title Perform 6 Minute Walk Test to assess/address functional endurance. Target date: 11/06/14   Baseline 9/20: 556' with RW   Status Achieved   PT SHORT TERM GOAL #3   Title Perform TUG to assess/address efficiency of functional mobility. Target date: 11/06/14   Baseline 9/20: TUG time = 28.3 seconds with RW   Status Achieved   PT SHORT TERM GOAL #4   Title Pt will ambulate 200' over level, indoor surfaces with mod I using LRAD to indicate increased safety/indepedence with household ambulation. Target date: 11/06/14   Baseline Met 10/12 with bariatric RW.   Status Achieved   PT SHORT TERM GOAL #5   Title Pt will negotiate 14 stairs with B rails and min A  of single person to indicate increased independence accessing second floor of home. Target date: 11/06/14   Baseline 10/10: required min guard of PT for stairs x16   Status Achieved           PT Long Term Goals - 11/30/14 1602    PT LONG TERM GOAL #1   Title Pt will improve 6 Minute Walk distance by 150' from baseline to indicate significant improvement in functional endurance. Target date: 12/04/14   Status On-going   PT LONG TERM GOAL #2   Title Pt will improve TUG score by 10.8 seconds from baseline to indicate significant improvement in efficiency of functional mobility.  Target date: 12/04/14   Status On-going   PT LONG TERM GOAL #3   Title Pt will ambulate 500' over unlevel, paved surfaces  with mod I using LRAD to indicate increased stability/independence with community mobility. Target date: 12/04/14   Baseline Met 11/10 with LBQC.   Status Achieved   PT LONG TERM GOAL #4   Title Pt will negotiate standard ramp and curb step with mod I using LRAD to indicate increased safety/indepedence traversing community obstacles. Target date: 12/04/14   Baseline Met 11/10 with LBQC.   Status Achieved   PT LONG TERM GOAL #5   Title Pt will negotiate 14 stairs with 2 rails and mod I to indicate increased independence accessing second floor of home.  Target date: 12/04/14   Baseline Met 11/27/14.   Status Achieved               Plan - 11/30/14 1630    Clinical Impression Statement Pt met LTG's for community ambulation and obstacle negotiation with mod I. When ambulating without LBQC, pt exhibits L Trendelenburg gait pattern and R trunk lean.    Pt will benefit from skilled therapeutic intervention in order to improve on the following deficits Abnormal gait;Decreased activity tolerance;Decreased balance;Decreased endurance;Decreased coordination;Decreased mobility;Decreased strength;Impaired sensation;Pain   Rehab Potential Good   PT Frequency Other (comment)  3x/week for 4 weeks  then 2x/week for subsequent 4 weeks   PT Duration Other (comment)  see above   PT Treatment/Interventions Vestibular;ADLs/Self Care Home Management;DME Instruction;Balance training;Therapeutic exercise;Therapeutic activities;Functional mobility training;Stair training;Gait training;Neuromuscular re-education;Patient/family education   PT Next Visit Plan Check remaining LTG's fro 6MWT and TUG and Recert   Consulted and Agree with Plan of Care Patient;Family member/caregiver   Family Member Consulted mom and dad        Problem List Patient Active Problem List   Diagnosis Date Noted  . Adjustment disorder with depressed mood   . Tetraplegia ([place]) 09/19/2014  . Acute blood loss anemia 09/19/2014  . Cervical spondylosis with myelopathy 09/18/2014  . Radiculopathy of arm 08/28/2014  . Hypogonadism male 09/08/2013  . 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 Cardiomyopathy 04/01/2011  . Wolff-Parkinson-White (WPW) syndrome 04/01/2011   Billie Ruddy, PT, DPT [facility] [address] [place], [place], [address] Phone: [phone]   Fax:  [phone] 11/30/2014, 4:48 PM   Name: Mark Gallagher MRN: [id number] Date of Birth: 1970-05-19

## 2014-11-30 NOTE — Progress Notes (Signed)
   Subjective:    Patient ID: Mark Gallagher, male    DOB: Jun 12, 1970, 44 y.o.   MRN: [id number]  HPI    Review of Systems     Objective:   Physical Exam        Assessment & Plan:  Patient here for testosterone cypionate injection 200 mg/ml, patient was advised the directions on label instructed to inject 1.5 ml. Patient was injected with 1 ml of testosterone, also he received his Sandostatin [place]AR [place] 30 mg.

## 2014-11-30 NOTE — Patient Instructions (Addendum)
Hamstring Stretch, Seated (Strap, Two Chairs)    Sit with LEFT leg extended onto facing chair.  Loop strap (sheet or belt) over top of LEFT foot (as pictured). Pull until you feel a gentle stretch in the back of your calf. Make sure your foot alignment is neutral (not turned inward) - you may need to pull the belt/sheet a little more with the left hand than the right hand to achieve this. Hold for 30 seconds, 4 times per day.   ANKLE: Dorsiflexion - Sitting    Sitting, place strap around LEFT foot. First, use your muscles to pull the foot upward (and outward) as far as you can go; then, use the strap to help to pull your foot up the rest of the way. Perform 20 reps, 3 times per day.

## 2014-12-01 NOTE — Telephone Encounter (Signed)
Please advise 

## 2014-12-05 ENCOUNTER — Ambulatory Visit: Payer: Managed Care, Other (non HMO) | Admitting: Occupational Therapy

## 2014-12-05 ENCOUNTER — Ambulatory Visit: Payer: Managed Care, Other (non HMO) | Admitting: Physical Therapy

## 2014-12-05 DIAGNOSIS — R279 Unspecified lack of coordination: Secondary | ICD-10-CM

## 2014-12-05 DIAGNOSIS — R2681 Unsteadiness on feet: Secondary | ICD-10-CM

## 2014-12-05 DIAGNOSIS — R278 Other lack of coordination: Secondary | ICD-10-CM

## 2014-12-05 DIAGNOSIS — M6281 Muscle weakness (generalized): Secondary | ICD-10-CM

## 2014-12-05 DIAGNOSIS — R269 Unspecified abnormalities of gait and mobility: Secondary | ICD-10-CM

## 2014-12-05 DIAGNOSIS — R531 Weakness: Secondary | ICD-10-CM

## 2014-12-05 DIAGNOSIS — R29898 Other symptoms and signs involving the musculoskeletal system: Secondary | ICD-10-CM

## 2014-12-05 DIAGNOSIS — M6289 Other specified disorders of muscle: Secondary | ICD-10-CM | POA: Diagnosis not present

## 2014-12-05 DIAGNOSIS — M21372 Foot drop, left foot: Secondary | ICD-10-CM

## 2014-12-05 NOTE — Therapy (Signed)
[facility] [address] [place], [place], [address] Phone: [phone]   Fax:  [phone]  Occupational Therapy Treatment  Patient Details  Name: Mark Gallagher MRN: [id number] Date of Birth: 1970-09-23 No Data Recorded  Encounter Date: 12/05/2014      OT End of Session - 12/05/14 1244    Visit Number 18   Number of Visits 23   Date for OT Re-Evaluation 01/29/15   Authorization Type Aetna, 60 visit limit combined   Authorization - Visit Number 18   Authorization - Number of Visits 30   OT Start Time 1103   OT Stop Time 1145   OT Time Calculation (min) 42 min   Activity Tolerance Patient tolerated treatment well   Behavior During Therapy [facility] for tasks assessed/performed      Past Medical History  Diagnosis Date  . Thyroid disease   . Cancer ([place])   . 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 Testosterone deficiency   . Knee pain, bilateral   . History of pituitary tumor     Past Surgical History  Procedure Laterality Date  . Cervical disc surgery  2007    Anterior C4/5 vertebrectomy and c3-C6 laminoplasty with plating  . Craniotomy for tumor      pituitary adenoma    There were no vitals filed for this visit.  Visit Diagnosis:  Weakness generalized  Left hand weakness  Decreased coordination  Muscle weakness (generalized)      Subjective Assessment - 12/05/14 1109    Subjective  mild neck pain today   Patient is accompained by: Family member   Pertinent History C3-T2 fusion 09/10/14 with cardiac surgery after surgery, hx of previous C1-C6 fusion (2007)   Patient Stated Goals return to work   Currently in Pain? Yes   Pain Score 3    Pain Location Neck   Pain Descriptors / Indicators Sore   Pain Type Surgical pain   Pain Onset More than a month ago   Pain Frequency Constant   Aggravating Factors  malpositioning   Pain Relieving Factors repositioning      Treatment: Arm bike x 8 mins level 4 for  conditioning. Standing for functional step/ reach outside base of support to place / remove graded clothespins from [place]. Gripper set at 55 lbs to pick up 1 inch blocks with LUE. Handwriting: Cursive 95% legible, printing 100% legible. Typing test 5WPM, with 81% accuracy followed by typing games for increased speed and accuracy in prep for return to work.                           OT Short Term Goals - 11/30/14 1722    OT SHORT TERM GOAL #1   Title Pt will verbalize understanding of coordination/hand strength HEP--check STGs 11/08/14   Time 4   Period Weeks   Status Achieved   OT SHORT TERM GOAL #2   Title Pt will perfom BADLs mod I.   Baseline min A bathing   Time 4   Period Weeks   Status Achieved  11/06/14  supervision for shower transfer, min A for R brace/neck brace;  11/27/14 met   OT SHORT TERM GOAL #3   Title Pt will improve L hand coordination for ADLs as shown by completing 9-hole peg test in less than 55sec.   Baseline 77.34sec   Time 4   Period Weeks   Status Achieved  11/06/14:  56.94sec;  11/27/14  45.69sec   OT SHORT TERM GOAL #4   Title Pt will demo at least 40lbs L grip strength for ADLs/opening containers.   Baseline 30lbs   Time 4   Period Weeks   Status Achieved  11/06/14:  45lbs   OT SHORT TERM GOAL #5   Title Pt will improve L hand coordination for ADLs as shown by completing 9-hole peg test in less than 40sec.--check 12/29/14   Time 4   Period Weeks   Status New   Additional Short Term Goals   Additional Short Term Goals Yes   OT SHORT TERM GOAL #6   Title Pt will be able to perform overhead reaching with LUE for at least 86mn without rest break for incr activity tolerance.--check 12/29/14   Time 4   Period Weeks   Status New   OT SHORT TERM GOAL #7   Title Pt will demo at least 55lbs L grip strength for ADLs/opening containers.--check 12/29/14   Time 4   Period Weeks   Status New           OT Long Term Goals -  11/30/14 1721    OT LONG TERM GOAL #1   Title Pt will be independent with proximal UE HEP when cleared from cervical/lifting precautions.--check updated LTGs 01/29/15   Status On-going  11/27/14 pt not yet cleared from cervical precautions   OT LONG TERM GOAL #2   Title Pt will perform simple meal prep/home maintenance tasks mod I.   Status On-going  not fully met 11/30/14   OT LONG TERM GOAL #3   Title Pt will improve L hand coordination for ADLs as shown by completing 9-hole peg test in less than 37sec.   Baseline 47.88secs  11/21/14; 11/27/14  45.69sec  (revised goal to <37 instead of less than 45sec for renewal)   Status Revised   OT LONG TERM GOAL #4   Title Pt will demo at least 60lbs L grip strength for ADLs/opening containers.   Baseline revised to 60 for renewal 11/30/14   Status Revised  11/27/14:  49lbs   OT LONG TERM GOAL #5   Title Pt will be able to retrieve 4lb object from overhead shelf with LUE.   Time 8   Period Weeks   Status New               Plan - 12/05/14 1242    Clinical Impression Statement Pt is progressing towards goals with improving grip strength and dynamic standing balance with functional reach.   Pt will benefit from skilled therapeutic intervention in order to improve on the following deficits (Retired) Decreased activity tolerance;Decreased balance;Decreased mobility;Decreased strength;Pain;Impaired UE functional use;Decreased knowledge of use of DME;Decreased coordination   Rehab Potential Good   OT Frequency 2x / week   OT Duration 8 weeks   OT Treatment/Interventions Passive range of motion;Moist Heat;Cryotherapy;DME and/or AE instruction;Therapeutic activities;Energy conservation;Therapeutic exercises;Neuromuscular education;Manual Therapy;Self-care/ADL training;Therapeutic exercise;Functional Mobility Training;Patient/family education   Plan fine motor coordination in prep for typing at work, LUE strength   [place]  issued: coordination 10/12/14, cane HEP and yellow band (bicep/triceps)   Consulted and Agree with Plan of Care Patient;Family member/caregiver   Family Member Consulted mother,         Problem List Patient Active Problem List   Diagnosis Date Noted  . Adjustment disorder with depressed mood   . Tetraplegia ([place] 09/19/2014  . Acute blood loss anemia 09/19/2014  . Cervical spondylosis with myelopathy 09/18/2014  .  Radiculopathy of arm 08/28/2014  . Hypogonadism male 09/08/2013  . 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 Cardiomyopathy 04/01/2011  . Wolff-Parkinson-White (WPW) syndrome 04/01/2011    RINE,KATHRYN 12/05/2014, 12:46 PM Theone Murdoch, OTR/L [phone] Phone: [phone] 12:46 PM 12/05/2014 [facility] [address] [place], [place], [address] Phone: [phone]   Fax:  [phone]  Name: Mark Gallagher MRN: [id number] Date of Birth: [birth year]

## 2014-12-05 NOTE — Therapy (Signed)
[facility] [address] [place], [place], [address] Phone: [phone]   Fax:  [phone]  Physical Therapy Treatment  Patient Details  Name: Mark Gallagher MRN: [id number] Date of Birth: 1970/12/06 No Data Recorded  Encounter Date: 12/05/2014      PT End of Session - 12/05/14 1309    Visit Number 21   Number of Visits 37  up to 51 visits, pending insurance coverage   Date for PT Re-Evaluation 02/03/15   Authorization Type Aetna    PT Start Time 1150   PT Stop Time 1235   PT Time Calculation (min) 45 min   Equipment Utilized During Treatment Cervical collar   Activity Tolerance Patient tolerated treatment well   Behavior During Therapy [facility] for tasks assessed/performed      Past Medical History  Diagnosis Date  . Thyroid disease   . Cancer ([place])   . 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 Testosterone deficiency   . Knee pain, bilateral   . History of pituitary tumor     Past Surgical History  Procedure Laterality Date  . Cervical disc surgery  2007    Anterior C4/5 vertebrectomy and c3-C6 laminoplasty with plating  . Craniotomy for tumor      pituitary adenoma    There were no vitals filed for this visit.  Visit Diagnosis:  Abnormality of gait - Plan: PT plan of care cert/re-cert  Weakness generalized - Plan: PT plan of care cert/re-cert  Decreased coordination - Plan: PT plan of care cert/re-cert  Foot drop, left - Plan: PT plan of care cert/re-cert  Unsteadiness - Plan: PT plan of care cert/re-cert      Subjective Assessment - 12/05/14 1155    Subjective Pt reports no significant changes; no falls, no dizziness. Mother requesting clarification on home exercise for ankle DF AAROM using sheet/towel. Pt reports plan to go back to work by December 7-8th at the earliest.    Patient is accompained by: Family member  mother, Mark Gallagher   Pertinent History Cervical precautions. Hard cervical collar at all times. PMH:  cervical spondylosis with myelopathy; s/p cervical vertebrectomy C4/5 with C3-6 plating 2007 acromegaly s/p pituitary tumor resection (1994); hypogonadism; Wolff-Parkinson-White Syndrome; cardiomyopathy; acute blood loss anemia; adjustment disorder with depressed mood   Patient Stated Goals "I'd like to be able to walk by myself with no assistance, stand up out of a chair by myself, be able to get up and down stairs without assistance.   Currently in Pain? Yes   Pain Score 3    Pain Location Neck   Pain Orientation Mid   Pain Descriptors / Indicators Sore   Pain Type Surgical pain   Pain Onset More than a month ago   Pain Frequency Constant   Aggravating Factors  malpositioning   Pain Relieving Factors repositioning   Effect of Pain on Daily Activities PT will monitor but will not directly address pain due to nature of surgical pain.   Multiple Pain Sites No            [facility] PT Assessment - 12/05/14 0001    6 minute walk test results    Aerobic Endurance Distance Walked 910   Endurance additional comments using bari RW   Timed Up and Go Test   TUG Normal TUG   Normal TUG (seconds) 17.67  using bari RW                     [facility]  Adult PT Treatment/Exercise - 12/05/14 0001    Ambulation/Gait   Ambulation/Gait Yes   Ambulation/Gait Assistance 6: Modified independent (Device/Increase time)   Ambulation Distance (Feet) 910 Feet  6MWT with bari RW   Assistive device Large base quad cane;Rolling walker   Gait Pattern Step-through pattern;Decreased hip/knee flexion - left;Decreased weight shift to left;Left flexed knee in stance;Lateral trunk lean to right;Trendelenburg  L Trendelenburg noted without AD only   Ambulation Surface Level;Indoor   Gait velocity 1.94 ft/sec  with LBQC   Timed Up and Go Test   TUG Normal TUG                  PT Short Term Goals - 12/05/14 1247    PT SHORT TERM GOAL #1   Title Pt will perform initial HEP with mod I using paper  handout to indicate safe HEP compliance, maximize functional gains made in PT. Target date: 11/06/14   Baseline Met 9/22.   Status Achieved   PT SHORT TERM GOAL #2   Title Perform 6 Minute Walk Test to assess/address functional endurance. Target date: 11/06/14   Baseline 9/20: 556' with RW   Status Achieved   PT SHORT TERM GOAL #3   Title Perform TUG to assess/address efficiency of functional mobility. Target date: 11/06/14   Baseline 9/20: TUG time = 28.3 seconds with RW   Status Achieved   PT SHORT TERM GOAL #4   Title Pt will ambulate 200' over level, indoor surfaces with mod I using LRAD to indicate increased safety/indepedence with household ambulation. Target date: 11/06/14   Baseline Met 10/12 with bariatric RW.   Status Achieved   PT SHORT TERM GOAL #5   Title Pt will negotiate 14 stairs with B rails and min A of single person to indicate increased independence accessing second floor of home. Target date: 11/06/14   Baseline 10/10: required min guard of PT for stairs x16   Status Achieved   PT SHORT TERM GOAL #6   Title Complete Berg Balance Scale and improve score by 4 points from baseline to progress toward significant improvement in functional standing balance. Target date: 01/02/15   Status New   PT SHORT TERM GOAL #7   Title Pt will improve gait velocity from 1.94 ft/sec to 2.24 ft/sec to indicate increased efficiency of ambulation. Target date: 01/02/15   Baseline 11/15: 1.94 ft/sec using LBQC   Status New   PT SHORT TERM GOAL #8   Title Pt will ambulate 26' over level, indoor surfaces without AD requiring supervision to progress toward independence with household mobility. Target date: 01/02/15   Status New           PT Long Term Goals - 12/05/14 1228    PT LONG TERM GOAL #1   Title Pt will improve 6 Minute Walk distance by 150' from baseline to indicate significant improvement in functional endurance. Target date: 12/04/14   Baseline 9/20: baseline 6MWT distance  = 556' with RW;  11/15: 6MWT distance = 910 ' with RW   Status Achieved   PT LONG TERM GOAL #2   Title Pt will improve TUG score by 10.8 seconds from baseline to indicate significant improvement in efficiency of functional mobility.  Target date: 12/04/14   Baseline 9/20: baseline TUG = 28.4 sec with RW;  11/15: TUG = 17.67 with RW   Status Achieved   PT LONG TERM GOAL #3   Title Pt will ambulate 500' over unlevel, paved surfaces with mod  I using LRAD to indicate increased stability/independence with community mobility. Target date: 12/04/14   Baseline Met 11/10 with LBQC.   Status Achieved   PT LONG TERM GOAL #4   Title Pt will negotiate standard ramp and curb step with mod I using LRAD to indicate increased safety/indepedence traversing community obstacles. Target date: 12/04/14   Baseline Met 11/10 with LBQC.   Status Achieved   PT LONG TERM GOAL #5   Title Pt will negotiate 14 stairs with 2 rails and mod I to indicate increased independence accessing second floor of home.  Target date: 12/04/14   Baseline Met 11/27/14.   Status Achieved   Additional Long Term Goals   Additional Long Term Goals --   PT LONG TERM GOAL #6   Title Pt will improve Berg score by 8 points from baseline to indicate significant improvement in functional standing balance. Target date: 01/30/15   Status New   PT LONG TERM GOAL #7   Title Pt will improve gait velocity to > 2.62 ft/sec to indicate status of community ambulatory.  Target date: 01/30/15   Status New   PT LONG TERM GOAL #8   Title Pt will perform TUG in < 13.5 seconds to indicate decreased fall risk. Target date: 01/30/15   Status New   PT LONG TERM GOAL  #9   TITLE Pt will ambulate 72' over level, indoor surfaces without AD with mod I to indicate increased stability/independence with household ambulation.  Target date: 01/30/15   Status New               Plan - 12/05/14 1300    Clinical Impression Statement Session focused on  assessing/addressing functional progress since PT evaluation. Pt met all short and long term goals, indicating significant improvement in stability/independence with functional mobility/ambulation. Pt continues to exhibit limited functional stanidng balance, decreased independence with both household and community mobility (as compared wih PLOF), and increased risk of falling. Pt will benefit from skilled outpatient PT 2x/week for up to 8 weeks, pending insurance coverage, to address remaining deficits.    Pt will benefit from skilled therapeutic intervention in order to improve on the following deficits Abnormal gait;Decreased activity tolerance;Decreased balance;Decreased endurance;Decreased coordination;Decreased mobility;Decreased strength;Impaired sensation;Pain   Rehab Potential Good   PT Frequency 2x / week   PT Duration 8 weeks   PT Next Visit Plan Perform BERG and initiate balance HEP   Consulted and Agree with Plan of Care Patient;Family member/caregiver   Family Member Consulted mother, Mark Gallagher        Problem List Patient Active Problem List   Diagnosis Date Noted  . Adjustment disorder with depressed mood   . Tetraplegia ([place]) 09/19/2014  . Acute blood loss anemia 09/19/2014  . Cervical spondylosis with myelopathy 09/18/2014  . Radiculopathy of arm 08/28/2014  . Hypogonadism male 09/08/2013  . 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 Cardiomyopathy 04/01/2011  . Wolff-Parkinson-White (WPW) syndrome 04/01/2011    Billie Ruddy, PT, DPT [facility] [address] [place], [place], [address] Phone: [phone]   Fax:  [phone] 12/05/2014, 1:15 PM  Name: Mark Gallagher MRN: [id number] Date of Birth: 08-Mar-1970

## 2014-12-07 ENCOUNTER — Other Ambulatory Visit: Payer: Self-pay | Admitting: Physician Assistant

## 2014-12-07 ENCOUNTER — Other Ambulatory Visit: Payer: Managed Care, Other (non HMO) | Admitting: Physician Assistant

## 2014-12-07 ENCOUNTER — Other Ambulatory Visit: Payer: Self-pay | Admitting: *Deleted

## 2014-12-07 DIAGNOSIS — R7989 Other specified abnormal findings of blood chemistry: Secondary | ICD-10-CM

## 2014-12-07 NOTE — Progress Notes (Signed)
Pt needs to have testosterone ordered per endocrinology.  He is currently injectable testosterone.

## 2014-12-07 NOTE — Progress Notes (Signed)
Pt needs to have a testosterone level per endocrinology.  He is on testosterone injections.

## 2014-12-08 ENCOUNTER — Ambulatory Visit: Payer: Managed Care, Other (non HMO) | Admitting: Physical Therapy

## 2014-12-08 ENCOUNTER — Ambulatory Visit: Payer: Managed Care, Other (non HMO) | Admitting: Occupational Therapy

## 2014-12-08 DIAGNOSIS — R278 Other lack of coordination: Secondary | ICD-10-CM

## 2014-12-08 DIAGNOSIS — R279 Unspecified lack of coordination: Secondary | ICD-10-CM

## 2014-12-08 DIAGNOSIS — R2681 Unsteadiness on feet: Secondary | ICD-10-CM

## 2014-12-08 DIAGNOSIS — R531 Weakness: Secondary | ICD-10-CM

## 2014-12-08 DIAGNOSIS — M6289 Other specified disorders of muscle: Secondary | ICD-10-CM | POA: Diagnosis not present

## 2014-12-08 LAB — TESTOSTERONE: Testosterone: 769 ng/dL (ref 300–890)

## 2014-12-08 LAB — SEX HORMONE BINDING GLOBULIN: Sex Hormone Binding: 21 nmol/L (ref 10–50)

## 2014-12-08 NOTE — Therapy (Signed)
[facility] [address] [place], [place], [address] Phone: [phone]   Fax:  [phone]  Physical Therapy Treatment  Patient Details  Name: Mark Gallagher MRN: [id number] Date of Birth: September 20, 1970 No Data Recorded  Encounter Date: 12/08/2014      PT End of Session - 12/08/14 1456    Visit Number 22   Number of Visits 37   Date for PT Re-Evaluation 02/03/15   Authorization Type Aetna    PT Start Time 1401   PT Stop Time 1445   PT Time Calculation (min) 44 min   Equipment Utilized During Treatment Cervical collar   Activity Tolerance Patient tolerated treatment well   Behavior During Therapy [facility] for tasks assessed/performed      Past Medical History  Diagnosis Date  . Thyroid disease   . Cancer ([place])   . 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 Testosterone deficiency   . Knee pain, bilateral   . History of pituitary tumor     Past Surgical History  Procedure Laterality Date  . Cervical disc surgery  2007    Anterior C4/5 vertebrectomy and c3-C6 laminoplasty with plating  . Craniotomy for tumor      pituitary adenoma    There were no vitals filed for this visit.  Visit Diagnosis:  Unsteadiness  Weakness generalized  Decreased coordination      Subjective Assessment - 12/08/14 1404    Subjective Pt reports no signficant changes, no falls. Pt reports he has been walking around house with CGA of mother.   Patient is accompained by: Family member  niece, Almyra Free   Pertinent History Cervical precautions. Hard cervical collar at all times. PMH: cervical spondylosis with myelopathy; s/p cervical vertebrectomy C4/5 with C3-6 plating 2007 acromegaly s/p pituitary tumor resection (1994); hypogonadism; Wolff-Parkinson-White Syndrome; cardiomyopathy; acute blood loss anemia; adjustment disorder with depressed mood   Patient Stated Goals "I'd like to be able to walk by myself with no assistance, stand up out of a chair by  myself, be able to get up and down stairs without assistance.   Currently in Pain? Yes   Pain Score 3    Pain Location Neck   Pain Orientation Mid   Pain Descriptors / Indicators Sore   Pain Type Surgical pain   Pain Onset More than a month ago   Pain Frequency Constant   Aggravating Factors  malpositioning   Pain Relieving Factors repositioning   Effect of Pain on Daily Activities PT will monitor but will not directly address pain due to nature of pain.   Multiple Pain Sites No                         [facility] Adult PT Treatment/Exercise - 12/08/14 0001    Standardized Balance Assessment   Standardized Balance Assessment Berg Balance Test   Berg Balance Test   Sit to Stand Able to stand  independently using hands   Standing Unsupported Able to stand safely 2 minutes   Sitting with Back Unsupported but Feet Supported on Floor or Stool Able to sit safely and securely 2 minutes   Stand to Sit Sits independently, has uncontrolled descent   Transfers Needs one person to assist  due to LOB to L side when pivoting (to L) to chair   Standing Unsupported with Eyes Closed Able to stand 10 seconds safely   Standing Ubsupported with Feet Together Able to place feet together independently and stand 1  minute safely   From Standing, Reach Forward with Outstretched Arm Can reach forward >12 cm safely (5")   From Standing Position, Pick up Object from [place] to pick up shoe, needs supervision   From Standing Position, Turn to Look Behind Over each Shoulder Needs supervision when turning  posterior LOB (efffective self-recovery)   Turn 360 Degrees Needs close supervision or verbal cueing   Standing Unsupported, Alternately Place Feet on Step/Stool Needs assistance to keep from falling or unable to try   Standing Unsupported, One Foot in [place] to plae foot ahead of the other independently and hold 30 seconds   Standing on One Leg Able to lift leg independently and hold equal to  or more than 3 seconds   Total Score 34   Neuro Re-ed    Neuro Re-ed Details  Completed Berg with score of 34/56; see outcome measure for detailed findings. With back to corner and chair in front of pt, performed blocked practice pof L SLS with multimodal cueing to avoid lateral trunk flexion to L side, which consistently led to LOB to L side.             Balance Exercises - 12/08/14 1453    Balance Exercises: Standing   SLS Eyes open;Solid surface;Intermittent upper extremity support;Other reps (comment)  RLE x3-4 sec each. LLE < 3 sec           PT Education - 12/08/14 1452    Education provided Yes   Education Details Merrilee Jansky findings, fall risk.   Person(s) Educated Patient   Methods Explanation   Comprehension Verbalized understanding          PT Short Term Goals - 12/08/14 1451    PT SHORT TERM GOAL #1   Title Pt will perform initial HEP with mod I using paper handout to indicate safe HEP compliance, maximize functional gains made in PT. Target date: 11/06/14   Baseline Met 9/22.   Status Achieved   PT SHORT TERM GOAL #2   Title Perform 6 Minute Walk Test to assess/address functional endurance. Target date: 11/06/14   Baseline 9/20: 556' with RW   Status Achieved   PT SHORT TERM GOAL #3   Title Perform TUG to assess/address efficiency of functional mobility. Target date: 11/06/14   Baseline 9/20: TUG time = 28.3 seconds with RW   Status Achieved   PT SHORT TERM GOAL #4   Title Pt will ambulate 200' over level, indoor surfaces with mod I using LRAD to indicate increased safety/indepedence with household ambulation. Target date: 11/06/14   Baseline Met 10/12 with bariatric RW.   Status Achieved   PT SHORT TERM GOAL #5   Title Pt will negotiate 14 stairs with B rails and min A of single person to indicate increased independence accessing second floor of home. Target date: 11/06/14   Baseline 10/10: required min guard of PT for stairs x16   Status Achieved   PT  SHORT TERM GOAL #6   Title Complete Berg Balance Scale and improve score by 4 points from baseline to progress toward significant improvement in functional standing balance. Target date: 01/02/15   Baseline 11/18: Berg score = 34/56   Status Achieved   PT SHORT TERM GOAL #7   Title Pt will improve gait velocity from 1.94 ft/sec to 2.24 ft/sec to indicate increased efficiency of ambulation. Target date: 01/02/15   Baseline 11/15: 1.94 ft/sec using LBQC   Status New   PT SHORT TERM GOAL #8  Title Pt will ambulate 54' over level, indoor surfaces without AD requiring supervision to progress toward independence with household mobility. Target date: 01/02/15   Status New           PT Long Term Goals - 12/08/14 1452    PT LONG TERM GOAL #1   Title Pt will improve 6 Minute Walk distance by 150' from baseline to indicate significant improvement in functional endurance. Target date: 12/04/14   Baseline 9/20: baseline 6MWT distance = 556' with RW;  11/15: 6MWT distance = 910 ' with RW   Status Achieved   PT LONG TERM GOAL #2   Title Pt will improve TUG score by 10.8 seconds from baseline to indicate significant improvement in efficiency of functional mobility.  Target date: 12/04/14   Baseline 9/20: baseline TUG = 28.4 sec with RW;  11/15: TUG = 17.67 with RW   Status Achieved   PT LONG TERM GOAL #3   Title Pt will ambulate 500' over unlevel, paved surfaces with mod I using LRAD to indicate increased stability/independence with community mobility. Target date: 12/04/14   Baseline Met 11/10 with LBQC.   Status Achieved   PT LONG TERM GOAL #4   Title Pt will negotiate standard ramp and curb step with mod I using LRAD to indicate increased safety/indepedence traversing community obstacles. Target date: 12/04/14   Baseline Met 11/10 with LBQC.   Status Achieved   PT LONG TERM GOAL #5   Title Pt will negotiate 14 stairs with 2 rails and mod I to indicate increased independence accessing second  floor of home.  Target date: 12/04/14   Baseline Met 11/27/14.   Status Achieved   PT LONG TERM GOAL #6   Title Pt will improve Berg score by 8 points from baseline to indicate significant improvement in functional standing balance. Target date: 01/30/15   Baseline 11/18: baseline Berg score = 34/56   Status New   PT LONG TERM GOAL #7   Title Pt will improve gait velocity to > 2.62 ft/sec to indicate status of community ambulatory.  Target date: 01/30/15   Status New   PT LONG TERM GOAL #8   Title Pt will perform TUG in < 13.5 seconds to indicate decreased fall risk. Target date: 01/30/15   Status New   PT LONG TERM GOAL  #9   TITLE Pt will ambulate 34' over level, indoor surfaces without AD with mod I to indicate increased stability/independence with household ambulation.  Target date: 01/30/15   Status New               Plan - 12/08/14 1457    Clinical Impression Statement Pt scored 34/56 on Berg, suggesting high fall risk. Noted decreased postural control, stability with lateral weight shifting, single limb stance activities on Berg.    Pt will benefit from skilled therapeutic intervention in order to improve on the following deficits Abnormal gait;Decreased activity tolerance;Decreased balance;Decreased endurance;Decreased coordination;Decreased mobility;Decreased strength;Impaired sensation;Pain   Rehab Potential Good   PT Frequency 2x / week   PT Duration 8 weeks   PT Treatment/Interventions Vestibular;ADLs/Self Care Home Management;DME Instruction;Balance training;Therapeutic exercise;Therapeutic activities;Functional mobility training;Stair training;Gait training;Neuromuscular re-education;Patient/family education;Electrical Stimulation   PT Next Visit Plan Provide balance HEP with focus on L single limb stance stability, lateral weight shifting (due to [place] findings).   Consulted and Agree with Plan of Care Patient;Family member/caregiver   Family Member Consulted niece, Almyra Free         Problem List Patient Active  Problem List   Diagnosis Date Noted  . Adjustment disorder with depressed mood   . Tetraplegia () 09/19/2014  . Acute blood loss anemia 09/19/2014  . Cervical spondylosis with myelopathy 09/18/2014  . Radiculopathy of arm 08/28/2014  . Hypogonadism male 09/08/2013  . 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 Cardiomyopathy 04/01/2011  . Wolff-Parkinson-White (WPW) syndrome 04/01/2011   Billie Ruddy, PT, DPT [facility] [address] [place], [place], [address] Phone: [phone]   Fax:  [phone] 12/08/2014, 3:00 PM   Name: Mark Gallagher MRN: [id number] Date of Birth: [birth year]

## 2014-12-08 NOTE — Therapy (Signed)
[facility] [address] [place], [place], [address] Phone: [phone]   Fax:  [phone]  Occupational Therapy Treatment  Patient Details  Name: Mark Gallagher MRN: [id number] Date of Birth: 18-Oct-1970 No Data Recorded  Encounter Date: 12/08/2014    Past Medical History  Diagnosis Date  . Thyroid disease   . Cancer ([place])   . 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 Testosterone deficiency   . Knee pain, bilateral   . History of pituitary tumor     Past Surgical History  Procedure Laterality Date  . Cervical disc surgery  2007    Anterior C4/5 vertebrectomy and c3-C6 laminoplasty with plating  . Craniotomy for tumor      pituitary adenoma    There were no vitals filed for this visit.  Visit Diagnosis:  Decreased coordination  Weakness generalized      Subjective Assessment - 12/08/14 1455    Patient is accompained by: Family member   Pertinent History C3-T2 fusion 09/10/14 with cardiac surgery after surgery, hx of previous C1-C6 fusion (2007)   Patient Stated Goals return to work   Currently in Pain? Yes   Pain Score 3    Pain Location Neck   Pain Descriptors / Indicators Sore   Pain Type Surgical pain   Pain Onset More than a month ago   Aggravating Factors  malposistioning    Pain Relieving Factors repositioning   Multiple Pain Sites No   Aggravating Factors  walking    Pain Relieving Factors sitting      Treatment:Fine motor coordination activities with LUE, O'connor pegs placing with  Tweezers mod/ max difficulty then  without tweezers and removing with tweezers, min-mod difficulty/ v.c.  Purdue pegboard with min difficulty for fine motor coordination. Functional step and reach top lace items in mid level shelves, minguard and min v.c.for balance, Arm bike x 8 mins level 4 for conditioning.                          OT Short Term Goals - 11/30/14 1722    OT SHORT TERM GOAL #1   Title  Pt will verbalize understanding of coordination/hand strength HEP--check STGs 11/08/14   Time 4   Period Weeks   Status Achieved   OT SHORT TERM GOAL #2   Title Pt will perfom BADLs mod I.   Baseline min A bathing   Time 4   Period Weeks   Status Achieved  11/06/14  supervision for shower transfer, min A for R brace/neck brace;  11/27/14 met   OT SHORT TERM GOAL #3   Title Pt will improve L hand coordination for ADLs as shown by completing 9-hole peg test in less than 55sec.   Baseline 77.34sec   Time 4   Period Weeks   Status Achieved  11/06/14:  56.94sec;  11/27/14  45.69sec   OT SHORT TERM GOAL #4   Title Pt will demo at least 40lbs L grip strength for ADLs/opening containers.   Baseline 30lbs   Time 4   Period Weeks   Status Achieved  11/06/14:  45lbs   OT SHORT TERM GOAL #5   Title Pt will improve L hand coordination for ADLs as shown by completing 9-hole peg test in less than 40sec.--check 12/29/14   Time 4   Period Weeks   Status New   Additional Short Term Goals   Additional Short Term Goals Yes   OT SHORT  TERM GOAL #6   Title Pt will be able to perform overhead reaching with LUE for at least 70mn without rest break for incr activity tolerance.--check 12/29/14   Time 4   Period Weeks   Status New   OT SHORT TERM GOAL #7   Title Pt will demo at least 55lbs L grip strength for ADLs/opening containers.--check 12/29/14   Time 4   Period Weeks   Status New           OT Long Term Goals - 11/30/14 1721    OT LONG TERM GOAL #1   Title Pt will be independent with proximal UE HEP when cleared from cervical/lifting precautions.--check updated LTGs 01/29/15   Status On-going  11/27/14 pt not yet cleared from cervical precautions   OT LONG TERM GOAL #2   Title Pt will perform simple meal prep/home maintenance tasks mod I.   Status On-going  not fully met 11/30/14   OT LONG TERM GOAL #3   Title Pt will improve L hand coordination for ADLs as shown by completing 9-hole peg  test in less than 37sec.   Baseline 47.88secs  11/21/14; 11/27/14  45.69sec  (revised goal to <37 instead of less than 45sec for renewal)   Status Revised   OT LONG TERM GOAL #4   Title Pt will demo at least 60lbs L grip strength for ADLs/opening containers.   Baseline revised to 60 for renewal 11/30/14   Status Revised  11/27/14:  49lbs   OT LONG TERM GOAL #5   Title Pt will be able to retrieve 4lb object from overhead shelf with LUE.   Time 8   Period Weeks   Status New               Plan - 12/08/14 1503    Clinical Impression Statement Pt is progressing towards goals with improving fine motor coordination   Pt will benefit from skilled therapeutic intervention in order to improve on the following deficits (Retired) Decreased activity tolerance;Decreased balance;Decreased mobility;Decreased strength;Pain;Impaired UE functional use;Decreased knowledge of use of DME;Decreased coordination   Rehab Potential Good   OT Frequency 2x / week   OT Duration 8 weeks   OT Treatment/Interventions Passive range of motion;Moist Heat;Cryotherapy;DME and/or AE instruction;Therapeutic activities;Energy conservation;Therapeutic exercises;Neuromuscular education;Manual Therapy;Self-care/ADL training;Therapeutic exercise;Functional Mobility Training;Patient/family education   OT Home Exercise Plan Education issued: coordination 10/12/14, cane HEP and yellow band (bicep/triceps)   Consulted and Agree with Plan of Care Patient;Family member/caregiver   Family Member Consulted niece        Problem List Patient Active Problem List   Diagnosis Date Noted  . Adjustment disorder with depressed mood   . Tetraplegia ([place] 09/19/2014  . Acute blood loss anemia 09/19/2014  . Cervical spondylosis with myelopathy 09/18/2014  . Radiculopathy of arm 08/28/2014  . Hypogonadism male 09/08/2013  . 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 Cardiomyopathy 04/01/2011  . Wolff-Parkinson-White (WPW) syndrome 04/01/2011     Keyler Hoge 12/08/2014, 3:23 PM KTheone Murdoch OTR/L [phone]: [phone] 12/08/2014 [id number] [address]Phone: [phone]  Fax:  [phone] Name: MORAZIO WELLERMRN: [id number] of Birth: 625-Oct-1972

## 2014-12-11 ENCOUNTER — Encounter: Payer: Self-pay | Admitting: Rehabilitation

## 2014-12-11 ENCOUNTER — Encounter: Payer: Self-pay | Admitting: Occupational Therapy

## 2014-12-11 ENCOUNTER — Ambulatory Visit: Payer: Managed Care, Other (non HMO) | Admitting: Occupational Therapy

## 2014-12-11 ENCOUNTER — Ambulatory Visit: Payer: Managed Care, Other (non HMO) | Admitting: Rehabilitation

## 2014-12-11 DIAGNOSIS — R531 Weakness: Secondary | ICD-10-CM

## 2014-12-11 DIAGNOSIS — M6289 Other specified disorders of muscle: Secondary | ICD-10-CM | POA: Diagnosis not present

## 2014-12-11 DIAGNOSIS — R269 Unspecified abnormalities of gait and mobility: Secondary | ICD-10-CM

## 2014-12-11 DIAGNOSIS — R279 Unspecified lack of coordination: Principal | ICD-10-CM

## 2014-12-11 DIAGNOSIS — R29898 Other symptoms and signs involving the musculoskeletal system: Secondary | ICD-10-CM

## 2014-12-11 DIAGNOSIS — R2681 Unsteadiness on feet: Secondary | ICD-10-CM

## 2014-12-11 DIAGNOSIS — R278 Other lack of coordination: Secondary | ICD-10-CM

## 2014-12-11 NOTE — Therapy (Signed)
[facility] [address] [place], [place], [address] Phone: [phone]   Fax:  [phone]  Physical Therapy Treatment  Patient Details  Name: Mark Gallagher MRN: [id number] Date of Birth: 1971-01-10 No Data Recorded  Encounter Date: 12/11/2014      PT End of Session - 12/11/14 1306    Visit Number 23   Number of Visits 37   Date for PT Re-Evaluation 02/03/15   Authorization Type [employer]et[employer]    PT Start Time 1015   PT Stop Time 1100   PT Time Calculation (min) 45 min   Equipment Utilized During Treatment Cervical collar   Activity Tolerance Patient tolerated treatment well   Behavior During Therapy [facility] for tasks assessed/performed      Past Medical History  Diagnosis Date  . Thyroid disease   . Cancer ([place])   . 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 Testosterone deficiency   . Knee pain, bilateral   . History of pituitary tumor     Past Surgical History  Procedure Laterality Date  . Cervical disc surgery  2007    Anterior C4/5 vertebrectomy and c3-C6 laminoplasty with plating  . Craniotomy for tumor      pituitary adenoma    There were no vitals filed for this visit.  Visit Diagnosis:  Abnormality of gait  Unsteadiness  Weakness generalized      Subjective Assessment - 12/11/14 1020    Subjective Reports no changes since last time and no falls.     Patient is accompained by: Family member   Pertinent History Cervical precautions. Hard cervical collar at all times. PMH: cervical spondylosis with myelopathy; s/p cervical vertebrectomy C4/5 with C3-6 plating 2007 acromegaly s/p pituitary tumor resection (1994); hypogonadism; Wolff-Parkinson-White Syndrome; cardiomyopathy; acute blood loss anemia; adjustment disorder with depressed mood   Patient Stated Goals "I'd like to be able to walk by myself with no assistance, stand up out of a chair by myself, be able to get up and down stairs without assistance.   Currently in  Pain? Yes   Pain Score 2    Pain Location Neck   Pain Orientation Mid   Pain Descriptors / Indicators Sore   Pain Type Surgical pain   Pain Onset More than a month ago   Pain Frequency Constant   Aggravating Factors  malpositioning   Pain Relieving Factors repositioning, aleve, rest                 NMR:  Performed tall kneeling task while reaching upwards and to the R in order to increase R hip abd activation with PT providing facilitation at L glutes in order to achieve full ROM.  Progressed task by adding small squats in tall kneeling before reaching back up to place target to father's hand.  Performed both x 10 reps each with tactile facilitation at pelvis and trunk for upright posture and increased L weight shift throughout.  Then performed LLE only bridging with LLE off EOM on small block.  Performed x 2 sets of 10 reps with cues and assist to decrease amount of assist from RLE.  Remainder of session focused on gait with use of mirror without AD initially then with SPC to assess carryover from NMR and work on decreased trunk compensatory movements.  Performed 100' x 2 reps, 115' x 2 reps (once without AD and once with [facility])  With PT facilitation at trunk to decrease amount of lateral movement and intermittently at pelvis to increase  weight shift and activation at L hip.  Tolerated well, therefore added SPC to add some support but decreased support than LBQC.  Note marked improvement in decreased trunk movement, but continues to have decreased L glute med activation.  Will continue to address.                 PT Education - 12/11/14 1305    Education Details Education on L glute med weakness and translation to gait.    Person(s) Educated Patient;Parent(s)   Methods Explanation   Comprehension Verbalized understanding          PT Short Term Goals - 12/08/14 1451    PT SHORT TERM GOAL #1   Title Pt will perform initial HEP with mod I using paper handout to indicate  safe HEP compliance, maximize functional gains made in PT. Target date: 11/06/14   Baseline Met 9/22.   Status Achieved   PT SHORT TERM GOAL #2   Title Perform 6 Minute Walk Test to assess/address functional endurance. Target date: 11/06/14   Baseline 9/20: 556' with RW   Status Achieved   PT SHORT TERM GOAL #3   Title Perform TUG to assess/address efficiency of functional mobility. Target date: 11/06/14   Baseline 9/20: TUG time = 28.3 seconds with RW   Status Achieved   PT SHORT TERM GOAL #4   Title Pt will ambulate 200' over level, indoor surfaces with mod I using LRAD to indicate increased safety/indepedence with household ambulation. Target date: 11/06/14   Baseline Met 10/12 with bariatric RW.   Status Achieved   PT SHORT TERM GOAL #5   Title Pt will negotiate 14 stairs with B rails and min A of single person to indicate increased independence accessing second floor of home. Target date: 11/06/14   Baseline 10/10: required min guard of PT for stairs x16   Status Achieved   PT SHORT TERM GOAL #6   Title Complete Berg Balance Scale and improve score by 4 points from baseline to progress toward significant improvement in functional standing balance. Target date: 01/02/15   Baseline 11/18: Berg score = 34/56   Status Achieved   PT SHORT TERM GOAL #7   Title Pt will improve gait velocity from 1.94 ft/sec to 2.24 ft/sec to indicate increased efficiency of ambulation. Target date: 01/02/15   Baseline 11/15: 1.94 ft/sec using LBQC   Status New   PT SHORT TERM GOAL #8   Title Pt will ambulate 58' over level, indoor surfaces without AD requiring supervision to progress toward independence with household mobility. Target date: 01/02/15   Status New           PT Long Term Goals - 12/08/14 1452    PT LONG TERM GOAL #1   Title Pt will improve 6 Minute Walk distance by 150' from baseline to indicate significant improvement in functional endurance. Target date: 12/04/14   Baseline 9/20:  baseline 6MWT distance = 556' with RW;  11/15: 6MWT distance = 910 ' with RW   Status Achieved   PT LONG TERM GOAL #2   Title Pt will improve TUG score by 10.8 seconds from baseline to indicate significant improvement in efficiency of functional mobility.  Target date: 12/04/14   Baseline 9/20: baseline TUG = 28.4 sec with RW;  11/15: TUG = 17.67 with RW   Status Achieved   PT LONG TERM GOAL #3   Title Pt will ambulate 500' over unlevel, paved surfaces with mod I using LRAD to  indicate increased stability/independence with community mobility. Target date: 12/04/14   Baseline Met 11/10 with LBQC.   Status Achieved   PT LONG TERM GOAL #4   Title Pt will negotiate standard ramp and curb step with mod I using LRAD to indicate increased safety/indepedence traversing community obstacles. Target date: 12/04/14   Baseline Met 11/10 with LBQC.   Status Achieved   PT LONG TERM GOAL #5   Title Pt will negotiate 14 stairs with 2 rails and mod I to indicate increased independence accessing second floor of home.  Target date: 12/04/14   Baseline Met 11/27/14.   Status Achieved   PT LONG TERM GOAL #6   Title Pt will improve Berg score by 8 points from baseline to indicate significant improvement in functional standing balance. Target date: 01/30/15   Baseline 11/18: baseline Berg score = 34/56   Status New   PT LONG TERM GOAL #7   Title Pt will improve gait velocity to > 2.62 ft/sec to indicate status of community ambulatory.  Target date: 01/30/15   Status New   PT LONG TERM GOAL #8   Title Pt will perform TUG in < 13.5 seconds to indicate decreased fall risk. Target date: 01/30/15   Status New   PT LONG TERM GOAL  #9   TITLE Pt will ambulate 67' over level, indoor surfaces without AD with mod I to indicate increased stability/independence with household ambulation.  Target date: 01/30/15   Status New               Plan - 12/11/14 1306    Clinical Impression Statement Skilled session focused  on NMR on LLE to improve glute med activation in tall kneeling, supine therex and finally gait. Also addressed trunk compensatory movements during gait with facilitation.  Tolerated well, continue POC.    Pt will benefit from skilled therapeutic intervention in order to improve on the following deficits Abnormal gait;Decreased activity tolerance;Decreased balance;Decreased endurance;Decreased coordination;Decreased mobility;Decreased strength;Impaired sensation;Pain   Rehab Potential Good   PT Frequency 2x / week   PT Duration 8 weeks   PT Treatment/Interventions Vestibular;ADLs/Self Care Home Management;DME Instruction;Balance training;Therapeutic exercise;Therapeutic activities;Functional mobility training;Stair training;Gait training;Neuromuscular re-education;Patient/family education;Electrical Stimulation   PT Next Visit Plan Provide balance HEP with focus on L single limb stance stability, lateral weight shifting (due to [place] findings).   Consulted and Agree with Plan of Care Patient;Family member/caregiver        Problem List Patient Active Problem List   Diagnosis Date Noted  . Adjustment disorder with depressed mood   . Tetraplegia ([place]) 09/19/2014  . Acute blood loss anemia 09/19/2014  . Cervical spondylosis with myelopathy 09/18/2014  . Radiculopathy of arm 08/28/2014  . Hypogonadism male 09/08/2013  . 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 Cardiomyopathy 04/01/2011  . Wolff-Parkinson-White (WPW) syndrome 04/01/2011    Cameron Sprang, PT, MPT [facility] [address] [place], [place], [address] Phone: [phone]   Fax:  [phone] 12/11/2014, 1:08 PM  Name: YAHMIR SOKOLOV MRN: [id number] Date of Birth: 1970-05-08

## 2014-12-11 NOTE — Therapy (Signed)
[facility] [address] [place], [place], [address] Phone: [phone]   Fax:  [phone]  Occupational Therapy Treatment  Patient Details  Name: Mark Gallagher MRN: [id number] Date of Birth: 17-Mar-1970 No Data Recorded  Encounter Date: 12/11/2014      OT End of Session - 12/11/14 1110    Visit Number 19   Number of Visits 23   Date for OT Re-Evaluation 01/29/15   Authorization Type Aetna, 60 visit limit combined   Authorization - Visit Number 19   Authorization - Number of Visits 30   OT Start Time 1103   OT Stop Time 1145   OT Time Calculation (min) 42 min   Activity Tolerance Patient tolerated treatment well   Behavior During Therapy [facility] for tasks assessed/performed      Past Medical History  Diagnosis Date  . Thyroid disease   . Cancer ([place])   . 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 Testosterone deficiency   . Knee pain, bilateral   . History of pituitary tumor     Past Surgical History  Procedure Laterality Date  . Cervical disc surgery  2007    Anterior C4/5 vertebrectomy and c3-C6 laminoplasty with plating  . Craniotomy for tumor      pituitary adenoma    There were no vitals filed for this visit.  Visit Diagnosis:  Decreased coordination  Weakness generalized  Left hand weakness      Subjective Assessment - 12/11/14 1107    Subjective  Pt reports that HR at work are going to work with him for return to work.   Pertinent History C3-T2 fusion 09/10/14 with cardiac surgery after surgery, hx of previous C1-C6 fusion (2007)   Patient Stated Goals return to work   Currently in Pain? Yes   Pain Score 2    Pain Location Neck  neck   Pain Descriptors / Indicators Sore   Pain Type Surgical pain   Aggravating Factors  malpositioning   Pain Relieving Factors repositioning, aleve, rest                      OT Treatments/Exercises (OP) - 12/11/14 0001    Shoulder Exercises: Seated   Other  Seated Exercises Arm bike x71mn forward/backward, level 3 for conditioning without rest.   Hand Exercises   Other Hand Exercises Picking up blocks using 75lbs sustained grip strength with LUE with min difficulty/drops   Fine Motor Coordination   Grooved pegs with min difficulty for incr coordination/in-hand manipulation  Removing small pegs from pegboard with each finger/thumb for incr coordination in prep for typing with min difficulty.   Functional Reaching Activities   Mid Level functional step and reach at the counter to each side to place clothespins with 1-8lb resistance on vertical pole and remove with supervision for balance in prep for IADLs.                  OT Short Term Goals - 11/30/14 1722    OT SHORT TERM GOAL #1   Title Pt will verbalize understanding of coordination/hand strength HEP--check STGs 11/08/14   Time 4   Period Weeks   Status Achieved   OT SHORT TERM GOAL #2   Title Pt will perfom BADLs mod I.   Baseline min A bathing   Time 4   Period Weeks   Status Achieved  11/06/14  supervision for shower transfer, min A for R brace/neck brace;  11/27/14 met  OT SHORT TERM GOAL #3   Title Pt will improve L hand coordination for ADLs as shown by completing 9-hole peg test in less than 55sec.   Baseline 77.34sec   Time 4   Period Weeks   Status Achieved  11/06/14:  56.94sec;  11/27/14  45.69sec   OT SHORT TERM GOAL #4   Title Pt will demo at least 40lbs L grip strength for ADLs/opening containers.   Baseline 30lbs   Time 4   Period Weeks   Status Achieved  11/06/14:  45lbs   OT SHORT TERM GOAL #5   Title Pt will improve L hand coordination for ADLs as shown by completing 9-hole peg test in less than 40sec.--check 12/29/14   Time 4   Period Weeks   Status New   Additional Short Term Goals   Additional Short Term Goals Yes   OT SHORT TERM GOAL #6   Title Pt will be able to perform overhead reaching with LUE for at least 13mn without rest break for  incr activity tolerance.--check 12/29/14   Time 4   Period Weeks   Status New   OT SHORT TERM GOAL #7   Title Pt will demo at least 55lbs L grip strength for ADLs/opening containers.--check 12/29/14   Time 4   Period Weeks   Status New           OT Long Term Goals - 11/30/14 1721    OT LONG TERM GOAL #1   Title Pt will be independent with proximal UE HEP when cleared from cervical/lifting precautions.--check updated LTGs 01/29/15   Status On-going  11/27/14 pt not yet cleared from cervical precautions   OT LONG TERM GOAL #2   Title Pt will perform simple meal prep/home maintenance tasks mod I.   Status On-going  not fully met 11/30/14   OT LONG TERM GOAL #3   Title Pt will improve L hand coordination for ADLs as shown by completing 9-hole peg test in less than 37sec.   Baseline 47.88secs  11/21/14; 11/27/14  45.69sec  (revised goal to <37 instead of less than 45sec for renewal)   Status Revised   OT LONG TERM GOAL #4   Title Pt will demo at least 60lbs L grip strength for ADLs/opening containers.   Baseline revised to 60 for renewal 11/30/14   Status Revised  11/27/14:  49lbs   OT LONG TERM GOAL #5   Title Pt will be able to retrieve 4lb object from overhead shelf with LUE.   Time 8   Period Weeks   Status New               Plan - 12/11/14 1125    Clinical Impression Statement Pt is progressing towards goals.  Goes to neurosurgeon next week.   Plan fine motor coordination, LUE strength;  ? note to MD next week   OT Home Exercise Plan Education issued: coordination 10/12/14, cane HEP and yellow band (bicep/triceps)   Consulted and Agree with Plan of Care Family member/caregiver;Patient        Problem List Patient Active Problem List   Diagnosis Date Noted  . Adjustment disorder with depressed mood   . [place]ra[place]gia ([place] 09/19/2014  . Acute blood loss anemia 09/19/2014  . Cervical spondylosis with myelopathy 09/18/2014  . Radiculopathy of arm 08/28/2014  .  Hypogonadism male 09/08/2013  . 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 Cardiomyopathy 04/01/2011  . Wolff-Parkinson-White (WPW) syndrome 04/01/2011    [facility]11/21/2016, 11:43 AM  [facility] [address] [place], [place], [address] Phone: [phone]   Fax:  [phone]  Name: TEGH FRANEK MRN: [id number] Date of Birth: 1970-10-13  Vianne Bulls, OTR/L 12/11/2014 11:44 AM

## 2014-12-12 ENCOUNTER — Ambulatory Visit: Payer: Managed Care, Other (non HMO) | Admitting: Occupational Therapy

## 2014-12-12 ENCOUNTER — Ambulatory Visit: Payer: Managed Care, Other (non HMO) | Admitting: Physical Therapy

## 2014-12-12 DIAGNOSIS — R531 Weakness: Secondary | ICD-10-CM

## 2014-12-12 DIAGNOSIS — R269 Unspecified abnormalities of gait and mobility: Secondary | ICD-10-CM

## 2014-12-12 DIAGNOSIS — M6289 Other specified disorders of muscle: Secondary | ICD-10-CM | POA: Diagnosis not present

## 2014-12-12 DIAGNOSIS — R2681 Unsteadiness on feet: Secondary | ICD-10-CM

## 2014-12-12 DIAGNOSIS — M6281 Muscle weakness (generalized): Secondary | ICD-10-CM

## 2014-12-12 DIAGNOSIS — R279 Unspecified lack of coordination: Principal | ICD-10-CM

## 2014-12-12 DIAGNOSIS — R29898 Other symptoms and signs involving the musculoskeletal system: Secondary | ICD-10-CM

## 2014-12-12 DIAGNOSIS — R278 Other lack of coordination: Secondary | ICD-10-CM

## 2014-12-12 LAB — TESTOSTERONE, FREE, TOTAL, SHBG
Sex Hormone Binding: 20 nmol/L (ref 10–50)
Testosterone, Free: 165 pg/mL (ref 47.0–244.0)
Testosterone-% Free: 2.8 % (ref 1.6–2.9)
Testosterone: 597 ng/dL (ref 300–890)

## 2014-12-12 NOTE — Therapy (Signed)
[facility] [address] [place], [place], [address] Phone: [phone]   Fax:  [phone]  Physical Therapy Treatment  Patient Details  Name: Mark Gallagher MRN: [id number] Date of Birth: 06-20-1970 No Data Recorded  Encounter Date: 12/12/2014      PT End of Session - 12/12/14 1315    Visit Number 24   Number of Visits 37   Date for PT Re-Evaluation 02/03/15   Authorization Type Aet[employer]    PT Start Time 1150   PT Stop Time 1240   PT Time Calculation (min) 50 min   Equipment Utilized During Treatment Gait belt;Cervical collar   Activity Tolerance Patient tolerated treatment well   Behavior During Therapy [facility] for tasks assessed/performed      Past Medical History  Diagnosis Date  . Thyroid disease   . Cancer ([place])   . 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 Testosterone deficiency   . Knee pain, bilateral   . History of pituitary tumor     Past Surgical History  Procedure Laterality Date  . Cervical disc surgery  2007    Anterior C4/5 vertebrectomy and c3-C6 laminoplasty with plating  . Craniotomy for tumor      pituitary adenoma    There were no vitals filed for this visit.  Visit Diagnosis:  Abnormality of gait  Weakness generalized  Unsteadiness      Subjective Assessment - 12/12/14 1150    Subjective Pt reports no signficant changes. Was able to negotiate stairs with reciprocal pattern for the first time this morning.   Pertinent History Cervical precautions. Hard cervical collar at all times. PMH: cervical spondylosis with myelopathy; s/p cervical vertebrectomy C4/5 with C3-6 plating 2007 acromegaly s/p pituitary tumor resection (1994); hypogonadism; Wolff-Parkinson-White Syndrome; cardiomyopathy; acute blood loss anemia; adjustment disorder with depressed mood   Patient Stated Goals "I'd like to be able to walk by myself with no assistance, stand up out of a chair by myself, be able to get up and down stairs  without assistance.   Currently in Pain? Yes   Pain Score 2    Pain Location Neck   Pain Orientation Mid   Pain Descriptors / Indicators Sore   Pain Type Surgical pain   Pain Onset More than a month ago   Pain Frequency Constant   Aggravating Factors  malpositioning   Pain Relieving Factors repositioning, aleve, rest   Multiple Pain Sites No            [facility] PT Assessment - 12/12/14 0001    Strength   Strength Assessment Site Ankle   Right/Left Ankle Left   Left Ankle Dorsiflexion 4-/5   Left Ankle Eversion 1/5                     [facility] Adult PT Treatment/Exercise - 12/12/14 1300    Ambulation/Gait   Ambulation/Gait Yes   Ambulation/Gait Assistance 4: Min guard;4: Min assist   Ambulation/Gait Assistance Details Tactile cueing at L iliac crest to promote L pelvic retraction, L Trendelenburg.   Ambulation Distance (Feet) 230 Feet   Assistive device None   Gait Pattern Step-through pattern;Decreased hip/knee flexion - left;Decreased weight shift to left;Left flexed knee in stance;Lateral trunk lean to right;Trendelenburg  L Trendelenburg; BUE guarded; L pelvic retraction   Ambulation Surface Level;Indoor   Neuro Re-ed    Neuro Re-ed Details  In tall kneeling without UE support, pt performed LUE anterolateral reaching to promote L gluteus medius activation during  LLE stance phase of gait. Pt performed 9 reps x5 sets prior to onset of compensatory posture/technique due to pt fatigue. Standing with mirror anterior to pt, performed RLE tapping on 2" step with resistive tactile cueing at L PSIS   Exercises   Other Exercises  L clamshells x12 reps for abductor strengthening; instructed pt to increase reps to 12 at home.                  PT Short Term Goals - 12/08/14 1451    PT SHORT TERM GOAL #1   Title Pt will perform initial HEP with mod I using paper handout to indicate safe HEP compliance, maximize functional gains made in PT. Target date: 11/06/14    Baseline Met 9/22.   Status Achieved   PT SHORT TERM GOAL #2   Title Perform 6 Minute Walk Test to assess/address functional endurance. Target date: 11/06/14   Baseline 9/20: 556' with RW   Status Achieved   PT SHORT TERM GOAL #3   Title Perform TUG to assess/address efficiency of functional mobility. Target date: 11/06/14   Baseline 9/20: TUG time = 28.3 seconds with RW   Status Achieved   PT SHORT TERM GOAL #4   Title Pt will ambulate 200' over level, indoor surfaces with mod I using LRAD to indicate increased safety/indepedence with household ambulation. Target date: 11/06/14   Baseline Met 10/12 with bariatric RW.   Status Achieved   PT SHORT TERM GOAL #5   Title Pt will negotiate 14 stairs with B rails and min A of single person to indicate increased independence accessing second floor of home. Target date: 11/06/14   Baseline 10/10: required min guard of PT for stairs x16   Status Achieved   PT SHORT TERM GOAL #6   Title Complete Berg Balance Scale and improve score by 4 points from baseline to progress toward significant improvement in functional standing balance. Target date: 01/02/15   Baseline 11/18: Berg score = 34/56   Status Achieved   PT SHORT TERM GOAL #7   Title Pt will improve gait velocity from 1.94 ft/sec to 2.24 ft/sec to indicate increased efficiency of ambulation. Target date: 01/02/15   Baseline 11/15: 1.94 ft/sec using LBQC   Status New   PT SHORT TERM GOAL #8   Title Pt will ambulate 62' over level, indoor surfaces without AD requiring supervision to progress toward independence with household mobility. Target date: 01/02/15   Status New           PT Long Term Goals - 12/08/14 1452    PT LONG TERM GOAL #1   Title Pt will improve 6 Minute Walk distance by 150' from baseline to indicate significant improvement in functional endurance. Target date: 12/04/14   Baseline 9/20: baseline 6MWT distance = 556' with RW;  11/15: 6MWT distance = 910 ' with RW    Status Achieved   PT LONG TERM GOAL #2   Title Pt will improve TUG score by 10.8 seconds from baseline to indicate significant improvement in efficiency of functional mobility.  Target date: 12/04/14   Baseline 9/20: baseline TUG = 28.4 sec with RW;  11/15: TUG = 17.67 with RW   Status Achieved   PT LONG TERM GOAL #3   Title Pt will ambulate 500' over unlevel, paved surfaces with mod I using LRAD to indicate increased stability/independence with community mobility. Target date: 12/04/14   Baseline Met 11/10 with LBQC.   Status Achieved   PT  LONG TERM GOAL #4   Title Pt will negotiate standard ramp and curb step with mod I using LRAD to indicate increased safety/indepedence traversing community obstacles. Target date: 12/04/14   Baseline Met 11/10 with LBQC.   Status Achieved   PT LONG TERM GOAL #5   Title Pt will negotiate 14 stairs with 2 rails and mod I to indicate increased independence accessing second floor of home.  Target date: 12/04/14   Baseline Met 11/27/14.   Status Achieved   PT LONG TERM GOAL #6   Title Pt will improve Berg score by 8 points from baseline to indicate significant improvement in functional standing balance. Target date: 01/30/15   Baseline 11/18: baseline Berg score = 34/56   Status New   PT LONG TERM GOAL #7   Title Pt will improve gait velocity to > 2.62 ft/sec to indicate status of community ambulatory.  Target date: 01/30/15   Status New   PT LONG TERM GOAL #8   Title Pt will perform TUG in < 13.5 seconds to indicate decreased fall risk. Target date: 01/30/15   Status New   PT LONG TERM GOAL  #9   TITLE Pt will ambulate 52' over level, indoor surfaces without AD with mod I to indicate increased stability/independence with household ambulation.  Target date: 01/30/15   Status New               Plan - 12/12/14 1315    Clinical Impression Statement Session continued to focus on facilitating L gluteus medius activation during LLE stance to prevent R hip  drop and subsequent lateral trunk lean. Pt tolerated interventions well; noted consistent L gluteus medius activation during NMR in tall kneeling.   Pt will benefit from skilled therapeutic intervention in order to improve on the following deficits Abnormal gait;Decreased activity tolerance;Decreased balance;Decreased endurance;Decreased coordination;Decreased mobility;Decreased strength;Impaired sensation;Pain   Rehab Potential Good   PT Frequency 2x / week   PT Duration 8 weeks   PT Treatment/Interventions Vestibular;ADLs/Self Care Home Management;DME Instruction;Balance training;Therapeutic exercise;Therapeutic activities;Functional mobility training;Stair training;Gait training;Neuromuscular re-education;Patient/family education;Electrical Stimulation   PT Next Visit Plan Consider adding standing balance to HEP. Continue to address L stance stability, lateral weight shifting without excessive lateral trunk lean.   Consulted and Agree with Plan of Care Patient;Family member/caregiver   Family Member Consulted mother        Problem List Patient Active Problem List   Diagnosis Date Noted  . Adjustment disorder with depressed mood   . Tetraplegia ([place]) 09/19/2014  . Acute blood loss anemia 09/19/2014  . Cervical spondylosis with myelopathy 09/18/2014  . Radiculopathy of arm 08/28/2014  . Hypogonadism male 09/08/2013  . 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 Cardiomyopathy 04/01/2011  . Wolff-Parkinson-White (WPW) syndrome 04/01/2011    Billie Ruddy, PT, DPT [facility] [address] [place], [place], [address] Phone: [phone]   Fax:  [phone] 12/12/2014, 9:35 PM   Name: TAYDON NASWORTHY MRN: [id number] Date of Birth: [birth year]

## 2014-12-12 NOTE — Patient Instructions (Signed)
12/12/14  Dr. Marin Shutter,  We have been seeing Mark Gallagher (DOB:  [birth year]) for occupational therapy s/p cervical fusion.  Pt has been making good progress with ADLs, L hand coordination, and L hand strength.  Please clarify the following so that we can continue to progress his therapy as appropriate:  1.  Can he now perform overhead reaching?  If not, when?  2.  Is he able able to shoulder strengthening now?  If not, when?  3.  What is his current lifting restriction?  Please contact me with any questions or concerns.  Thank you,   Vianne Bulls, OTR/L 12/12/2014 11:23 AM   [facility] [address], [place], [place]  [address] [phone]  Phone [phone]  Fax

## 2014-12-12 NOTE — Therapy (Signed)
[facility] [address] [place], [place], [address] Phone: [phone]   Fax:  [phone]  Occupational Therapy Treatment  Patient Details  Name: Mark Gallagher MRN: [id number] Date of Birth: 07-Apr-1970 No Data Recorded  Encounter Date: 12/12/2014      OT End of Session - 12/12/14 1110    Visit Number 20   Number of Visits 33   Date for OT Re-Evaluation 01/29/15   Authorization Type [employer]et[employer], 60 visit limit combined   Authorization Time Period renewal completed 11/30/14, wk 2/8   Authorization - Visit Number 20   Authorization - Number of Visits 30   OT Start Time 1103   OT Stop Time 1145   OT Time Calculation (min) 42 min   Activity Tolerance Patient tolerated treatment well   Behavior During Therapy [facility] for tasks assessed/performed      Past Medical History  Diagnosis Date  . Thyroid disease   . Cancer ([place])   . 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 Testosterone deficiency   . Knee pain, bilateral   . History of pituitary tumor     Past Surgical History  Procedure Laterality Date  . Cervical disc surgery  2007    Anterior C4/5 vertebrectomy and c3-C6 laminoplasty with plating  . Craniotomy for tumor      pituitary adenoma    There were no vitals filed for this visit.  Visit Diagnosis:  Decreased coordination  Left hand weakness  Muscle weakness (generalized)      Subjective Assessment - 12/11/14 1107    Subjective  Pt reports that HR at work are going to work with him for return to work.   Pertinent History C3-T2 fusion 09/10/14 with cardiac surgery after surgery, hx of previous C1-C6 fusion (2007)   Patient Stated Goals return to work   Currently in Pain? Yes   Pain Score 2    Pain Location Neck  neck   Pain Descriptors / Indicators Sore   Pain Type Surgical pain   Aggravating Factors  malpositioning   Pain Relieving Factors repositioning, aleve, rest                      OT  Treatments/Exercises (OP) - 12/12/14 0001    ADLs   Work Typing test with 6wpm and decr accuracy (due to hitting caps lock x2).  Then typing game "bubbles" with score of 322 to incr typing speed/accuracy in prep for return to work.   Shoulder Exercises: Seated   Other Seated Exercises Arm bike x32mn forward/backward, level 4 for conditioning without rest.   Hand Exercises   Other Hand Exercises Removing pegs from green putty with min difficulty and incr time for incr strength.   Fine Motor Coordination   Fine Motor Coordination O'Connor pegs   Small Pegboard Placing small pegs in pegboard with L hand with min difficulty using each finger/thumb for incr coordination   O'Connor pegs Placing pegs in pegboard with tweezers with max difficulty.  Then in with fingers with min difficulty.  Removing with tweezers with min difficulty for incr coordination                OT Education - 12/12/14 1217    Education Details Pt/mother given note for MD appt next week to clarify precautions--see pt instructions   Person(s) Educated Patient;Parent(s)   Methods Explanation   Comprehension Verbalized understanding          OT Short Term Goals -  11/30/14 1722    OT SHORT TERM GOAL #1   Title Pt will verbalize understanding of coordination/hand strength HEP--check STGs 11/08/14   Time 4   Period Weeks   Status Achieved   OT SHORT TERM GOAL #2   Title Pt will perfom BADLs mod I.   Baseline min A bathing   Time 4   Period Weeks   Status Achieved  11/06/14  supervision for shower transfer, min A for R brace/neck brace;  11/27/14 met   OT SHORT TERM GOAL #3   Title Pt will improve L hand coordination for ADLs as shown by completing 9-hole peg test in less than 55sec.   Baseline 77.34sec   Time 4   Period Weeks   Status Achieved  11/06/14:  56.94sec;  11/27/14  45.69sec   OT SHORT TERM GOAL #4   Title Pt will demo at least 40lbs L grip strength for ADLs/opening containers.   Baseline 30lbs    Time 4   Period Weeks   Status Achieved  11/06/14:  45lbs   OT SHORT TERM GOAL #5   Title Pt will improve L hand coordination for ADLs as shown by completing 9-hole peg test in less than 40sec.--check 12/29/14   Time 4   Period Weeks   Status New   Additional Short Term Goals   Additional Short Term Goals Yes   OT SHORT TERM GOAL #6   Title Pt will be able to perform overhead reaching with LUE for at least 31mn without rest break for incr activity tolerance.--check 12/29/14   Time 4   Period Weeks   Status New   OT SHORT TERM GOAL #7   Title Pt will demo at least 55lbs L grip strength for ADLs/opening containers.--check 12/29/14   Time 4   Period Weeks   Status New           OT Long Term Goals - 11/30/14 1721    OT LONG TERM GOAL #1   Title Pt will be independent with proximal UE HEP when cleared from cervical/lifting precautions.--check updated LTGs 01/29/15   Status On-going  11/27/14 pt not yet cleared from cervical precautions   OT LONG TERM GOAL #2   Title Pt will perform simple meal prep/home maintenance tasks mod I.   Status On-going  not fully met 11/30/14   OT LONG TERM GOAL #3   Title Pt will improve L hand coordination for ADLs as shown by completing 9-hole peg test in less than 37sec.   Baseline 47.88secs  11/21/14; 11/27/14  45.69sec  (revised goal to <37 instead of less than 45sec for renewal)   Status Revised   OT LONG TERM GOAL #4   Title Pt will demo at least 60lbs L grip strength for ADLs/opening containers.   Baseline revised to 60 for renewal 11/30/14   Status Revised  11/27/14:  49lbs   OT LONG TERM GOAL #5   Title Pt will be able to retrieve 4lb object from overhead shelf with LUE.   Time 8   Period Weeks   Status New               Plan - 12/12/14 1219    Clinical Impression Statement Pt is progressing towards goals.  Anticipated d/c of cervical precautions next week as pt goes to neurosurgeon.   Plan continue with coordination, LUE  stength   OT Home Exercise Plan Education issued: coordination 10/12/14, cane HEP and yellow band (bicep/triceps)   Consulted and  Agree with Plan of Care Family member/caregiver;Patient   Family Member Consulted mother        Problem List Patient Active Problem List   Diagnosis Date Noted  . Adjustment disorder with depressed mood   . Tetraplegia ([place]) 09/19/2014  . Acute blood loss anemia 09/19/2014  . Cervical spondylosis with myelopathy 09/18/2014  . Radiculopathy of arm 08/28/2014  . Hypogonadism male 09/08/2013  . 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) 04/01/2011  . Cardiomyopathy 04/01/2011  . Wolff-Parkinson-White (WPW) syndrome 04/01/2011    [facility] 12/12/2014, 12:22 PM  [facility] [address] [place], [place], [address] Phone: [phone]   Fax:  [phone]  Name: Mark Gallagher MRN: [id number] Date of Birth: 1970-04-04  Vianne Bulls, OTR/L 12/12/2014 12:22 PM

## 2014-12-13 ENCOUNTER — Ambulatory Visit: Payer: Managed Care, Other (non HMO)

## 2014-12-13 DIAGNOSIS — E291 Testicular hypofunction: Secondary | ICD-10-CM

## 2014-12-13 LAB — PSA

## 2014-12-18 ENCOUNTER — Ambulatory Visit: Payer: Managed Care, Other (non HMO) | Admitting: Occupational Therapy

## 2014-12-18 ENCOUNTER — Ambulatory Visit: Payer: Managed Care, Other (non HMO) | Admitting: Physical Therapy

## 2014-12-18 ENCOUNTER — Encounter: Payer: Self-pay | Admitting: Internal Medicine

## 2014-12-18 DIAGNOSIS — R531 Weakness: Secondary | ICD-10-CM

## 2014-12-18 DIAGNOSIS — R278 Other lack of coordination: Secondary | ICD-10-CM

## 2014-12-18 DIAGNOSIS — R279 Unspecified lack of coordination: Secondary | ICD-10-CM

## 2014-12-18 DIAGNOSIS — R269 Unspecified abnormalities of gait and mobility: Secondary | ICD-10-CM

## 2014-12-18 DIAGNOSIS — M6289 Other specified disorders of muscle: Secondary | ICD-10-CM | POA: Diagnosis not present

## 2014-12-18 DIAGNOSIS — M21372 Foot drop, left foot: Secondary | ICD-10-CM

## 2014-12-18 NOTE — Therapy (Signed)
[facility] [address] [place], [place], [address] Phone: [phone]   Fax:  [phone]  Physical Therapy Treatment  Patient Details  Name: Mark Gallagher MRN: [id number] Date of Birth: 08-Oct-1970 No Data Recorded  Encounter Date: 12/18/2014      PT End of Session - 12/18/14 2101    Visit Number 25   Number of Visits 37   Date for PT Re-Evaluation 02/03/15   Authorization Type [employer]    Authorization - Visit Number 25   Authorization - Number of Visits 30   PT Start Time 6503   PT Stop Time 1102   PT Time Calculation (min) 44 min   Equipment Utilized During Treatment Gait belt;Cervical collar   Activity Tolerance Patient tolerated treatment well   Behavior During Therapy [facility] for tasks assessed/performed      Past Medical History  Diagnosis Date  . Thyroid disease   . Cancer ([place])   . 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 Testosterone deficiency   . Knee pain, bilateral   . History of pituitary tumor     Past Surgical History  Procedure Laterality Date  . Cervical disc surgery  2007    Anterior C4/5 vertebrectomy and c3-C6 laminoplasty with plating  . Craniotomy for tumor      pituitary adenoma    There were no vitals filed for this visit.  Visit Diagnosis:  Abnormality of gait  Weakness generalized  Decreased coordination  Foot drop, left      Subjective Assessment - 12/18/14 1024    Subjective Pt denies falls and reports no significant changes since last session.  Pt reports he has been walking 15 minutes per day without AD in home. Mother requesting clarification on HEP. Mother also requesting that this PT "look at how [place] is doing the stairs; his left leg is doing something funny," per mother,   Patient is accompained by: Family member  parents   Pertinent History Cervical precautions. Hard cervical collar at all times. PMH: cervical spondylosis with myelopathy; s/p cervical vertebrectomy C4/5  with C3-6 plating 2007 acromegaly s/p pituitary tumor resection (1994); hypogonadism; Wolff-Parkinson-White Syndrome; cardiomyopathy; acute blood loss anemia; adjustment disorder with depressed mood   Patient Stated Goals "I'd like to be able to walk by myself with no assistance, stand up out of a chair by myself, be able to get up and down stairs without assistance.   Currently in Pain? Yes   Pain Score 1    Pain Location Neck   Pain Orientation Mid   Pain Descriptors / Indicators Sore   Pain Type Surgical pain   Pain Onset More than a month ago   Pain Frequency Constant   Aggravating Factors  malpositioning   Pain Relieving Factors repositioning, aleve, rest   Multiple Pain Sites No                         [facility] Adult PT Treatment/Exercise - 12/18/14 0001    Ambulation/Gait   Ambulation/Gait Yes   Ambulation/Gait Assistance 4: Min guard;4: Min assist   Ambulation/Gait Assistance Details tactile cueing/resistance applied at L ASIS to increase L pelvic protraction during gait (emphasis during L stance phase). Noted minimal carryover when tactile cueing removed.   Ambulation Distance (Feet) 345 Feet   Assistive device None   Gait Pattern Step-through pattern;Decreased hip/knee flexion - left;Decreased weight shift to left;Left flexed knee in stance;Lateral trunk lean to right;Trendelenburg  L Trendelenburg; L pelvic  retraction   Ambulation Surface Level;Indoor   Stairs Yes   Stairs Assistance 6: Modified independent (Device/Increase time);5: Supervision   Stairs Assistance Details (indicate cue type and reason) Mod I for stability/balance; however, supervision/cueing provided to modify technique for reciprocal pattern to step-to pattern leading with LLE during ascent to decrease LLE extension synergy.    Stair Management Technique One rail Left   Number of Stairs 12  4 stairs x3 trials   Height of Stairs 6   Curb 6: Modified independent (Device/increase time)   Curb  Details (indicate cue type and reason) with [facility] with RLE leading. Noted increased LLE extension (L hip extension and limited L knee flexion) and LLE adduction during ascent.   Neuro Re-ed    Neuro Re-ed Details  Reviewed prior L ankle HEP, per request of mother. With verbal/demo cueing from PT, performed single limb stance 5 trials x2-3 second holds per side without UE support; cueing focused on LLE stance stability. See Pt Instructions for details on all home exercises.                PT Education - 12/18/14 2052    Education provided Yes   Education Details HEP: reviewed L ankle exericses, per request of mother, and added SLS. Recommending limiting gait without AD to 4 minutes consecutively to avoid reinforcement of L Trendelenburg, lateral trunk lean with fatigue.   Person(s) Educated Patient;Parent(s)   Methods Explanation;Demonstration;Verbal cues;Handout   Comprehension Verbalized understanding;Returned demonstration          PT Short Term Goals - 12/08/14 1451    PT SHORT TERM GOAL #1   Title Pt will perform initial HEP with mod I using paper handout to indicate safe HEP compliance, maximize functional gains made in PT. Target date: 11/06/14   Baseline Met 9/22.   Status Achieved   PT SHORT TERM GOAL #2   Title Perform 6 Minute Walk Test to assess/address functional endurance. Target date: 11/06/14   Baseline 9/20: 556' with RW   Status Achieved   PT SHORT TERM GOAL #3   Title Perform TUG to assess/address efficiency of functional mobility. Target date: 11/06/14   Baseline 9/20: TUG time = 28.3 seconds with RW   Status Achieved   PT SHORT TERM GOAL #4   Title Pt will ambulate 200' over level, indoor surfaces with mod I using LRAD to indicate increased safety/indepedence with household ambulation. Target date: 11/06/14   Baseline Met 10/12 with bariatric RW.   Status Achieved   PT SHORT TERM GOAL #5   Title Pt will negotiate 14 stairs with B rails and min A of single  person to indicate increased independence accessing second floor of home. Target date: 11/06/14   Baseline 10/10: required min guard of PT for stairs x16   Status Achieved   PT SHORT TERM GOAL #6   Title Complete Berg Balance Scale and improve score by 4 points from baseline to progress toward significant improvement in functional standing balance. Target date: 01/02/15   Baseline 11/18: Berg score = 34/56   Status Achieved   PT SHORT TERM GOAL #7   Title Pt will improve gait velocity from 1.94 ft/sec to 2.24 ft/sec to indicate increased efficiency of ambulation. Target date: 01/02/15   Baseline 11/15: 1.94 ft/sec using LBQC   Status New   PT SHORT TERM GOAL #8   Title Pt will ambulate 76' over level, indoor surfaces without AD requiring supervision to progress toward independence with household mobility. Target  date: 01/02/15   Status New           PT Long Term Goals - 12/08/14 1452    PT LONG TERM GOAL #1   Title Pt will improve 6 Minute Walk distance by 150' from baseline to indicate significant improvement in functional endurance. Target date: 12/04/14   Baseline 9/20: baseline 6MWT distance = 556' with RW;  11/15: 6MWT distance = 910 ' with RW   Status Achieved   PT LONG TERM GOAL #2   Title Pt will improve TUG score by 10.8 seconds from baseline to indicate significant improvement in efficiency of functional mobility.  Target date: 12/04/14   Baseline 9/20: baseline TUG = 28.4 sec with RW;  11/15: TUG = 17.67 with RW   Status Achieved   PT LONG TERM GOAL #3   Title Pt will ambulate 500' over unlevel, paved surfaces with mod I using LRAD to indicate increased stability/independence with community mobility. Target date: 12/04/14   Baseline Met 11/10 with LBQC.   Status Achieved   PT LONG TERM GOAL #4   Title Pt will negotiate standard ramp and curb step with mod I using LRAD to indicate increased safety/indepedence traversing community obstacles. Target date: 12/04/14    Baseline Met 11/10 with LBQC.   Status Achieved   PT LONG TERM GOAL #5   Title Pt will negotiate 14 stairs with 2 rails and mod I to indicate increased independence accessing second floor of home.  Target date: 12/04/14   Baseline Met 11/27/14.   Status Achieved   PT LONG TERM GOAL #6   Title Pt will improve Berg score by 8 points from baseline to indicate significant improvement in functional standing balance. Target date: 01/30/15   Baseline 11/18: baseline Berg score = 34/56   Status New   PT LONG TERM GOAL #7   Title Pt will improve gait velocity to > 2.62 ft/sec to indicate status of community ambulatory.  Target date: 01/30/15   Status New   PT LONG TERM GOAL #8   Title Pt will perform TUG in < 13.5 seconds to indicate decreased fall risk. Target date: 01/30/15   Status New   PT LONG TERM GOAL  #9   TITLE Pt will ambulate 89' over level, indoor surfaces without AD with mod I to indicate increased stability/independence with household ambulation.  Target date: 01/30/15   Status New               Plan - 12/18/14 2102    Clinical Impression Statement Skilled session focused on increasing L stance stability to improve stability/independence with gait. Noted increased LLE extensor synergy during negotiation of stairs/curb step during this session. Modified technique to decrease movement pattern. Educated pt/family on importance of decreasing time ambulating without AD to prevent reinforcement of L Trendelenburg gait pattern.   Pt will benefit from skilled therapeutic intervention in order to improve on the following deficits Abnormal gait;Decreased activity tolerance;Decreased balance;Decreased endurance;Decreased coordination;Decreased mobility;Decreased strength;Impaired sensation;Pain   Rehab Potential Good   PT Frequency 2x / week   PT Duration 8 weeks   PT Treatment/Interventions Vestibular;ADLs/Self Care Home Management;DME Instruction;Balance training;Therapeutic  exercise;Therapeutic activities;Functional mobility training;Stair training;Gait training;Neuromuscular re-education;Patient/family education;Electrical Stimulation   PT Next Visit Plan NMR with focus on selective control in LLE; L stance stability.   Consulted and Agree with Plan of Care Patient;Family member/caregiver   Family Member Consulted parents        Problem List Patient Active Problem List  Diagnosis Date Noted  . Adjustment disorder with depressed mood   . Tetraplegia ([place]) 09/19/2014  . Acute blood loss anemia 09/19/2014  . Cervical spondylosis with myelopathy 09/18/2014  . Radiculopathy of arm 08/28/2014  . Hypogonadism male 09/08/2013  . 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 Cardiomyopathy 04/01/2011  . Wolff-Parkinson-White (WPW) syndrome 04/01/2011    Billie Ruddy, PT, DPT [facility] [address] [place], [place], [address] Phone: [phone]   Fax:  [phone] 12/18/2014, 9:06 PM   Name: Mark Gallagher MRN: [id number] Date of Birth: 03/05/1970

## 2014-12-18 NOTE — Therapy (Signed)
[facility] [address] [place], [place], [address] Phone: [phone]   Fax:  [phone]  Occupational Therapy Treatment  Patient Details  Name: Mark Gallagher MRN: [id number] Date of Birth: 1970-11-09 No Data Recorded  Encounter Date: 12/18/2014      OT End of Session - 12/18/14 1107    Visit Number 21   Number of Visits 33   Date for OT Re-Evaluation 01/29/15   Authorization Type A[employer], 60 visit limit combined   Authorization Time Period renewal completed 11/30/14, wk 2/8   Authorization - Visit Number 21   Authorization - Number of Visits 30   OT Start Time 1104   OT Stop Time 1145   OT Time Calculation (min) 41 min   Activity Tolerance Patient tolerated treatment well   Behavior During Therapy [facility] for tasks assessed/performed      Past Medical History  Diagnosis Date  . Thyroid disease   . Cancer ([place])   . 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 Testosterone deficiency   . Knee pain, bilateral   . History of pituitary tumor     Past Surgical History  Procedure Laterality Date  . Cervical disc surgery  2007    Anterior C4/5 vertebrectomy and c3-C6 laminoplasty with plating  . Craniotomy for tumor      pituitary adenoma    There were no vitals filed for this visit.  Visit Diagnosis:  Weakness generalized  Decreased coordination      Subjective Assessment - 12/18/14 1106    Currently in Pain? Yes   Pain Score 1    Pain Location Neck   Pain Descriptors / Indicators Sore   Pain Type Surgical pain   Aggravating Factors  malpositioning   Pain Relieving Factors repositioning, aleve, rest                      OT Treatments/Exercises (OP) - 12/18/14 0001    ADLs   Writing practiced writing/copying recipe directions with good legibility and incr time.  Then practiced words/letters that pt had more difficulty with including (a, s, g) in cursive   Work Practiced typing left-handed words with  improved speed from last trail and only 1 error (omitted 1 letter).  Then, typing names/addresses with incr time/dificulty, but improved with repetition.  Entered in phone #'s with good speed/accuracy (using R hand and # pad).    Exercises   Exercises --  Arm bikex67mn level 3 for conditioning without rest 55-60rpm                  OT Short Term Goals - 11/30/14 1722    OT SHORT TERM GOAL #1   Title Pt will verbalize understanding of coordination/hand strength HEP--check STGs 11/08/14   Time 4   Period Weeks   Status Achieved   OT SHORT TERM GOAL #2   Title Pt will perfom BADLs mod I.   Baseline min A bathing   Time 4   Period Weeks   Status Achieved  11/06/14  supervision for shower transfer, min A for R brace/neck brace;  11/27/14 met   OT SHORT TERM GOAL #3   Title Pt will improve L hand coordination for ADLs as shown by completing 9-hole peg test in less than 55sec.   Baseline 77.34sec   Time 4   Period Weeks   Status Achieved  11/06/14:  56.94sec;  11/27/14  45.69sec   OT SHORT TERM GOAL #4  Title Pt will demo at least 40lbs L grip strength for ADLs/opening containers.   Baseline 30lbs   Time 4   Period Weeks   Status Achieved  11/06/14:  45lbs   OT SHORT TERM GOAL #5   Title Pt will improve L hand coordination for ADLs as shown by completing 9-hole peg test in less than 40sec.--check 12/29/14   Time 4   Period Weeks   Status New   Additional Short Term Goals   Additional Short Term Goals Yes   OT SHORT TERM GOAL #6   Title Pt will be able to perform overhead reaching with LUE for at least 64mn without rest break for incr activity tolerance.--check 12/29/14   Time 4   Period Weeks   Status New   OT SHORT TERM GOAL #7   Title Pt will demo at least 55lbs L grip strength for ADLs/opening containers.--check 12/29/14   Time 4   Period Weeks   Status New           OT Long Term Goals - 11/30/14 1721    OT LONG TERM GOAL #1   Title Pt will be independent  with proximal UE HEP when cleared from cervical/lifting precautions.--check updated LTGs 01/29/15   Status On-going  11/27/14 pt not yet cleared from cervical precautions   OT LONG TERM GOAL #2   Title Pt will perform simple meal prep/home maintenance tasks mod I.   Status On-going  not fully met 11/30/14   OT LONG TERM GOAL #3   Title Pt will improve L hand coordination for ADLs as shown by completing 9-hole peg test in less than 37sec.   Baseline 47.88secs  11/21/14; 11/27/14  45.69sec  (revised goal to <37 instead of less than 45sec for renewal)   Status Revised   OT LONG TERM GOAL #4   Title Pt will demo at least 60lbs L grip strength for ADLs/opening containers.   Baseline revised to 60 for renewal 11/30/14   Status Revised  11/27/14:  49lbs   OT LONG TERM GOAL #5   Title Pt will be able to retrieve 4lb object from overhead shelf with LUE.   Time 8   Period Weeks   Status New               Plan - 12/18/14 1108    Clinical Impression Statement Pt continues to progress towards goals.  Anticipate d/c of cervical precautions when pt goes to neurosurgeon Wed  (note given to pt/mother regarding clarification of remaining precautions).   Plan progress with LUE strength/update HEP if cervical precautions cleared   OT Home Exercise Plan Education issued: coordination 10/12/14, cane HEP and yellow band (bicep/triceps)   Consulted and Agree with Plan of Care Family member/caregiver;Patient   Family Member Consulted mother        Problem List Patient Active Problem List   Diagnosis Date Noted  . Adjustment disorder with depressed mood   . [place] ([place] 09/19[place]2014  . Acute blood loss anemia 09/19/2014  . Cervical spondylosis with myelopathy 09/18/2014  . Radiculopathy of arm 08/28/2014  . Hypogonadism male 09/08/2013  . [place] ([place] 04/01/[place]  . Cardiomyopathy 04/01/2011  . Wolff-Parkinson-White (WPW) syndrome 04/01/2011    [facility]11/28/2016, 11:45  AM  [place] [address]SN[place] [address]Phone: [phone]  Fax:  [phone] Name: MELVIN MCCARTINMRN: [id number] of Birth: 601-Mar-1972 AVianne Bulls OTR/L 12/18/2014 11:46 AM

## 2014-12-18 NOTE — Patient Instructions (Addendum)
SINGLE LIMB STANCE    Stand with your back to a corner with a stable chair in front of you. Lift your RIGHT leg; try to stand on LEFT leg with minimal R hand support (1-2 fingers on chair). Try to hold for 5 seconds without losing balance. Your parents will let you know if you're leaning to the LEFT side. Relax. Repeat on opposite leg.  Perform 5 reps on each leg. Do this twice per day.  Hamstring Stretch, Seated (Strap, Two Chairs)    Sit with LEFT leg extended onto facing chair.  Loop strap (sheet or belt) over top of LEFT foot (as pictured). Pull until you feel a gentle stretch in the back of your calf. Make sure your foot alignment is neutral (not turned inward) - you may need to pull the belt/sheet a little more with the left hand than the right hand to achieve this. Hold for 30 seconds, 4 times per day.   ANKLE: Dorsiflexion - Sitting    Sitting, place strap around LEFT foot. First, use your muscles to pull the foot upward (and outward) as far as you can go; then, use the strap to help to pull your foot up the rest of the way. Perform 20 reps, 3 times per day.

## 2014-12-21 ENCOUNTER — Ambulatory Visit: Payer: Managed Care, Other (non HMO) | Admitting: Physical Therapy

## 2014-12-21 ENCOUNTER — Encounter: Payer: Self-pay | Admitting: Occupational Therapy

## 2014-12-21 ENCOUNTER — Ambulatory Visit: Payer: Managed Care, Other (non HMO) | Attending: Physical Medicine & Rehabilitation | Admitting: Occupational Therapy

## 2014-12-21 DIAGNOSIS — G8250 Quadriplegia, unspecified: Secondary | ICD-10-CM | POA: Diagnosis present

## 2014-12-21 DIAGNOSIS — R269 Unspecified abnormalities of gait and mobility: Secondary | ICD-10-CM

## 2014-12-21 DIAGNOSIS — M6281 Muscle weakness (generalized): Secondary | ICD-10-CM | POA: Diagnosis present

## 2014-12-21 DIAGNOSIS — R278 Other lack of coordination: Secondary | ICD-10-CM

## 2014-12-21 DIAGNOSIS — R42 Dizziness and giddiness: Secondary | ICD-10-CM | POA: Insufficient documentation

## 2014-12-21 DIAGNOSIS — R531 Weakness: Secondary | ICD-10-CM | POA: Insufficient documentation

## 2014-12-21 DIAGNOSIS — R2681 Unsteadiness on feet: Secondary | ICD-10-CM

## 2014-12-21 DIAGNOSIS — R279 Unspecified lack of coordination: Secondary | ICD-10-CM

## 2014-12-21 DIAGNOSIS — R29898 Other symptoms and signs involving the musculoskeletal system: Secondary | ICD-10-CM

## 2014-12-21 DIAGNOSIS — M6289 Other specified disorders of muscle: Secondary | ICD-10-CM | POA: Insufficient documentation

## 2014-12-21 NOTE — Therapy (Signed)
[facility] [address] [place], [place], [address] Phone: [phone]   Fax:  [phone]  Occupational Therapy Treatment  Patient Details  Name: Mark Gallagher MRN: [id number] Date of Birth: 1970/05/23 No Data Recorded  Encounter Date: 12/21/2014      OT End of Session - 12/21/14 1619    Visit Number 22   Number of Visits 33   Date for OT Re-Evaluation 01/29/15   Authorization Type [employer], 60 visit limit combined   Authorization Time Period renewal completed 11/30/14, wk 3/8   Authorization - Visit Number 57   Authorization - Number of Visits 30   OT Start Time 4656   OT Stop Time 1445   OT Time Calculation (min) 38 min   Activity Tolerance Patient tolerated treatment well   Behavior During Therapy [facility] for tasks assessed/performed      Past Medical History  Diagnosis Date  . Thyroid disease   . Cancer ([place])   . 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 Testosterone deficiency   . Knee pain, bilateral   . History of pituitary tumor     Past Surgical History  Procedure Laterality Date  . Cervical disc surgery  2007    Anterior C4/5 vertebrectomy and c3-C6 laminoplasty with plating  . Craniotomy for tumor      pituitary adenoma    There were no vitals filed for this visit.  Visit Diagnosis:  Left hand weakness  Muscle weakness (generalized)      Subjective Assessment - 12/21/14 1616    Currently in Pain? Yes   Pain Score 3    Pain Location Neck   Pain Orientation Posterior   Pain Descriptors / Indicators Sore   Pain Type Chronic pain   Pain Onset More than a month ago   Pain Frequency Constant   Aggravating Factors  Patient is now out of cervical collar   Pain Relieving Factors medication                      OT Treatments/Exercises (OP) - 12/21/14 0001    ADLs   Work Patient saw surgeon yesterday.  He is cleared to return to work on 01/01/15 .  He intends to return for 4 hours/day for the  first 2 weeks, then 6 hours/day for the next two weeks, and then full time.  Patient has reached out to his employer to determine if this is a reasonable accomodation.  Spoke to patient about adjustable standing work stations, to allow him to sit or stand while at work.  Patient anticipates needing to sit more than prior, yet station does not accomodate the length of his legs, and he ends up sitting at an angle to his keyboard.     Shoulder Exercises: Supine   Other Supine Exercises Weighted dowel - 4 lbs- for chest press x 15 reps, shoulder flexion in increasing ranges up to ~ 145 degrees, and shoulder horizontal adduction, abduction, and protraction.  Patient limited in end range of shoulder flexion and abduction by capsular tightness as well as overall proximal weakness in left upper extremity.     Other Supine Exercises Shouler sustained in 80-90 degrees of shoulder flexion, isolating controlled elbow flexion and extension with 2 lb dumbbell x10 reps - with emphasis on controlled movement, especially with directional changes.                  OT Education - 12/21/14 1618    Education provided  Yes   Education Details Possible work Development worker, community for improved body mechanics, shoulder stretching and strengthening exercises - have not yet issued as HEP   Person(s) Educated Patient;Parent(s)   Methods Explanation;Demonstration;Tactile cues;Verbal cues   Comprehension Verbalized understanding;Tactile cues required;Need further instruction          OT Short Term Goals - 11/30/14 1722    OT SHORT TERM GOAL #1   Title Pt will verbalize understanding of coordination/hand strength HEP--check STGs 11/08/14   Time 4   Period Weeks   Status Achieved   OT SHORT TERM GOAL #2   Title Pt will perfom BADLs mod I.   Baseline min A bathing   Time 4   Period Weeks   Status Achieved  11/06/14  supervision for shower transfer, min A for R brace/neck brace;  11/27/14 met   OT SHORT TERM GOAL #3    Title Pt will improve L hand coordination for ADLs as shown by completing 9-hole peg test in less than 55sec.   Baseline 77.34sec   Time 4   Period Weeks   Status Achieved  11/06/14:  56.94sec;  11/27/14  45.69sec   OT SHORT TERM GOAL #4   Title Pt will demo at least 40lbs L grip strength for ADLs/opening containers.   Baseline 30lbs   Time 4   Period Weeks   Status Achieved  11/06/14:  45lbs   OT SHORT TERM GOAL #5   Title Pt will improve L hand coordination for ADLs as shown by completing 9-hole peg test in less than 40sec.--check 12/29/14   Time 4   Period Weeks   Status New   Additional Short Term Goals   Additional Short Term Goals Yes   OT SHORT TERM GOAL #6   Title Pt will be able to perform overhead reaching with LUE for at least 36mn without rest break for incr activity tolerance.--check 12/29/14   Time 4   Period Weeks   Status New   OT SHORT TERM GOAL #7   Title Pt will demo at least 55lbs L grip strength for ADLs/opening containers.--check 12/29/14   Time 4   Period Weeks   Status New           OT Long Term Goals - 11/30/14 1721    OT LONG TERM GOAL #1   Title Pt will be independent with proximal UE HEP when cleared from cervical/lifting precautions.--check updated LTGs 01/29/15   Status On-going  11/27/14 pt not yet cleared from cervical precautions   OT LONG TERM GOAL #2   Title Pt will perform simple meal prep/home maintenance tasks mod I.   Status On-going  not fully met 11/30/14   OT LONG TERM GOAL #3   Title Pt will improve L hand coordination for ADLs as shown by completing 9-hole peg test in less than 37sec.   Baseline 47.88secs  11/21/14; 11/27/14  45.69sec  (revised goal to <37 instead of less than 45sec for renewal)   Status Revised   OT LONG TERM GOAL #4   Title Pt will demo at least 60lbs L grip strength for ADLs/opening containers.   Baseline revised to 60 for renewal 11/30/14   Status Revised  11/27/14:  49lbs   OT LONG TERM GOAL #5   Title  Pt will be able to retrieve 4lb object from overhead shelf with LUE.   Time 8   Period Weeks   Status New  Plan - 12/21/14 1620    Clinical Impression Statement Patient is showing steady progress toward remaining OT goals.  Patient's cervical collar has been discontinued, and he has been cleared by MD to lift up to 30 lbs, and to raise arms overhead.  Patient beginning to plan return to work gradually increasing his hours to full time over a 4 week span of time starting 12/12.     Pt will benefit from skilled therapeutic intervention in order to improve on the following deficits (Retired) Decreased activity tolerance;Decreased balance;Decreased mobility;Decreased strength;Pain;Impaired UE functional use;Decreased knowledge of use of DME;Decreased coordination   Rehab Potential Good   OT Frequency 2x / week   OT Duration 8 weeks   OT Treatment/Interventions Passive range of motion;Moist Heat;Cryotherapy;DME and/or AE instruction;Therapeutic activities;Energy conservation;Therapeutic exercises;Neuromuscular education;Manual Therapy;Self-care/ADL training;Therapeutic exercise;Functional Mobility Training;Patient/family education   Plan Issue HEP for UE strengthening and stretching   OT Home Exercise Plan Education issued: coordination 10/12/14, cane HEP and yellow band (bicep/triceps)   Consulted and Agree with Plan of Care Patient;Family member/caregiver   Family Member Consulted mother        Problem List Patient Active Problem List   Diagnosis Date Noted  . Adjustment disorder with depressed mood   . Tetraplegia ([place]) 09/19/2014  . Acute blood loss anemia 09/19/2014  . Cervical spondylosis with myelopathy 09/18/2014  . Radiculopathy of arm 08/28/2014  . Hypogonadism male 09/08/2013  . 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 Cardiomyopathy 04/01/2011  . Wolff-Parkinson-White (WPW) syndrome 04/01/2011    Mariah Milling, OTR/L 12/21/2014, 4:34 PM  [facility] [address] [place], [place], [address] Phone: [phone]   Fax:  [phone]  Name: Mark Gallagher MRN: [id number] Date of Birth: 06-02-1970

## 2014-12-21 NOTE — Therapy (Signed)
[facility] [address] [place], [place], [address] Phone: [phone]   Fax:  [phone]  Physical Therapy Treatment  Patient Details  Name: Mark Gallagher MRN: [id number] Date of Birth: 05-19-1970 No Data Recorded  Encounter Date: 12/21/2014      PT End of Session - 12/21/14 1917    Visit Number 26   Number of Visits 37   Date for PT Re-Evaluation 02/03/15   Authorization - Visit Number 26   Authorization - Number of Visits 30   PT Start Time 6734   PT Stop Time 1400   PT Time Calculation (min) 45 min   Equipment Utilized During Treatment Gait belt   Activity Tolerance Patient tolerated treatment well   Behavior During Therapy [facility] for tasks assessed/performed      Past Medical History  Diagnosis Date  . Thyroid disease   . Cancer ([place])   . 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 Testosterone deficiency   . Knee pain, bilateral   . History of pituitary tumor     Past Surgical History  Procedure Laterality Date  . Cervical disc surgery  2007    Anterior C4/5 vertebrectomy and c3-C6 laminoplasty with plating  . Craniotomy for tumor      pituitary adenoma    There were no vitals filed for this visit.  Visit Diagnosis:  Abnormality of gait  Decreased coordination  Unsteadiness      Subjective Assessment - 12/21/14 1322    Subjective Pt saw MD yesterday and is now cleared to remove hard cervical collar and to lift up to 30 lbs. MD also cleared pt to return to work, starting with 4-hour work day and increasing hours every 2 weeks.   Patient is accompained by: Family member  mother   Pertinent History * 30 lb lifting restriction. PMH: cervical spondylosis with myelopathy; s/p cervical vertebrectomy C4/5 with C3-6 plating 2007 acromegaly s/p pituitary tumor resection (1994); hypogonadism; Wolff-Parkinson-White Syndrome; cardiomyopathy; acute blood loss anemia; adjustment disorder with depressed mood   Patient Stated  Goals "I'd like to be able to walk by myself with no assistance, stand up out of a chair by myself, be able to get up and down stairs without assistance.   Currently in Pain? Yes   Pain Score 4    Pain Location Neck   Pain Orientation Mid   Pain Descriptors / Indicators Sore   Pain Type Chronic pain   Pain Onset More than a month ago   Pain Frequency Constant   Aggravating Factors  Pt unsure   Pain Relieving Factors Rest; Aleve   Multiple Pain Sites No                         [facility] Adult PT Treatment/Exercise - 12/21/14 1905    Ambulation/Gait   Ambulation/Gait Yes   Ambulation/Gait Assistance 4: Min guard;4: Min assist   Ambulation/Gait Assistance Details During initial 2 gait trials (2 x115'), cueing focused on promote L heel strike due to L foot catch > 5 episodes. Overall, noted significant improvement in L pelvic retraction, L Trendelenburg gait pattern during this session. Gait training [phone] with Tband at LLE (anchored at anterior L ankle, posterior L knee, and anterolateral L hip) to increase proprioceptive input, decrease L hip internal rotation during gait. Resistive force of Tband was effective in providing feedback, as exhibited by more consistent LLE clearance.    Ambulation Distance (Feet) 880 Feet  2 x210';  4 x115'   Assistive device None   Gait Pattern Step-through pattern;Decreased hip/knee flexion - left;Decreased weight shift to left;Left flexed knee in stance;Lateral trunk lean to right;Poor foot clearance - left;Lateral hip instability  L Trendelenburg; L pelvic retraction   Neuro Re-ed    Neuro Re-ed Details  With Tband at LLE (as described under gait training above) and with intermittent RUE support at rail, pt peformed alternating step taps on 6" step 2 x15 trials with multimodal cueing for lateral weight shift to L side, L stance stability, and increased L hip/knee flexion during LLE tapping. Also verbally cued pt initially to look at LLE (now that  cervical collar is off), then progressed to using peripheral vision during step tapping. Transitioned to standing  without UE support with mirror anterior to pt for viisual feedback, performed alternating step taps on 2" step x20 reps with improved accuracy. Performed the following in tall kneeling for increased LLE proprioceptive input, increased proximal stability/control: modified squats x10 reps; lateral stepping x5 then x4 to R side, x12 to L side. Cueing focused on upright posture, lateral L hip stability, and trunk control during activities.                  PT Short Term Goals - 12/08/14 1451    PT SHORT TERM GOAL #1   Title Pt will perform initial HEP with mod I using paper handout to indicate safe HEP compliance, maximize functional gains made in PT. Target date: 11/06/14   Baseline Met 9/22.   Status Achieved   PT SHORT TERM GOAL #2   Title Perform 6 Minute Walk Test to assess/address functional endurance. Target date: 11/06/14   Baseline 9/20: 556' with RW   Status Achieved   PT SHORT TERM GOAL #3   Title Perform TUG to assess/address efficiency of functional mobility. Target date: 11/06/14   Baseline 9/20: TUG time = 28.3 seconds with RW   Status Achieved   PT SHORT TERM GOAL #4   Title Pt will ambulate 200' over level, indoor surfaces with mod I using LRAD to indicate increased safety/indepedence with household ambulation. Target date: 11/06/14   Baseline Met 10/12 with bariatric RW.   Status Achieved   PT SHORT TERM GOAL #5   Title Pt will negotiate 14 stairs with B rails and min A of single person to indicate increased independence accessing second floor of home. Target date: 11/06/14   Baseline 10/10: required min guard of PT for stairs x16   Status Achieved   PT SHORT TERM GOAL #6   Title Complete Berg Balance Scale and improve score by 4 points from baseline to progress toward significant improvement in functional standing balance. Target date: 01/02/15    Baseline 11/18: Berg score = 34/56   Status Achieved   PT SHORT TERM GOAL #7   Title Pt will improve gait velocity from 1.94 ft/sec to 2.24 ft/sec to indicate increased efficiency of ambulation. Target date: 01/02/15   Baseline 11/15: 1.94 ft/sec using LBQC   Status New   PT SHORT TERM GOAL #8   Title Pt will ambulate 52' over level, indoor surfaces without AD requiring supervision to progress toward independence with household mobility. Target date: 01/02/15   Status New           PT Long Term Goals - 12/08/14 1452    PT LONG TERM GOAL #1   Title Pt will improve 6 Minute Walk distance by 150' from baseline to indicate significant improvement  in functional endurance. Target date: 12/04/14   Baseline 9/20: baseline 6MWT distance = 556' with RW;  11/15: 6MWT distance = 910 ' with RW   Status Achieved   PT LONG TERM GOAL #2   Title Pt will improve TUG score by 10.8 seconds from baseline to indicate significant improvement in efficiency of functional mobility.  Target date: 12/04/14   Baseline 9/20: baseline TUG = 28.4 sec with RW;  11/15: TUG = 17.67 with RW   Status Achieved   PT LONG TERM GOAL #3   Title Pt will ambulate 500' over unlevel, paved surfaces with mod I using LRAD to indicate increased stability/independence with community mobility. Target date: 12/04/14   Baseline Met 11/10 with LBQC.   Status Achieved   PT LONG TERM GOAL #4   Title Pt will negotiate standard ramp and curb step with mod I using LRAD to indicate increased safety/indepedence traversing community obstacles. Target date: 12/04/14   Baseline Met 11/10 with LBQC.   Status Achieved   PT LONG TERM GOAL #5   Title Pt will negotiate 14 stairs with 2 rails and mod I to indicate increased independence accessing second floor of home.  Target date: 12/04/14   Baseline Met 11/27/14.   Status Achieved   PT LONG TERM GOAL #6   Title Pt will improve Berg score by 8 points from baseline to indicate significant  improvement in functional standing balance. Target date: 01/30/15   Baseline 11/18: baseline Berg score = 34/56   Status New   PT LONG TERM GOAL #7   Title Pt will improve gait velocity to > 2.62 ft/sec to indicate status of community ambulatory.  Target date: 01/30/15   Status New   PT LONG TERM GOAL #8   Title Pt will perform TUG in < 13.5 seconds to indicate decreased fall risk. Target date: 01/30/15   Status New   PT LONG TERM GOAL  #9   TITLE Pt will ambulate 24' over level, indoor surfaces without AD with mod I to indicate increased stability/independence with household ambulation.  Target date: 01/30/15   Status New               Plan - 12/21/14 1919    Clinical Impression Statement Session focused on increasing proprioceptive input in LLE to improve LLE placementand gait stability without AD. Noted L hip stability much improved during gait training today, as compared with previous sessions. Pt continues to exhibit consistent L toe catch when ambulating (using L AFO) without AD. MD cleared pt to remove cervical collar yesterday. Lifting restricted to 30 lbs.    Pt will benefit from skilled therapeutic intervention in order to improve on the following deficits Abnormal gait;Decreased activity tolerance;Decreased balance;Decreased endurance;Decreased coordination;Decreased mobility;Decreased strength;Impaired sensation;Pain   Rehab Potential Good   PT Frequency 2x / week   PT Duration 8 weeks   PT Treatment/Interventions Vestibular;ADLs/Self Care Home Management;DME Instruction;Balance training;Therapeutic exercise;Therapeutic activities;Functional mobility training;Stair training;Gait training;Neuromuscular re-education;Patient/family education;Electrical Stimulation   PT Next Visit Plan Gait training, NMR with focus on increasing proprioceptive, visual input to improve LLE placement, L stance stability during gait.   Consulted and Agree with Plan of Care Patient;Family  member/caregiver   Family Member Consulted mother        Problem List Patient Active Problem List   Diagnosis Date Noted  . Adjustment disorder with depressed mood   . Tetraplegia ([place]) 09/19/2014  . Acute blood loss anemia 09/19/2014  . Cervical spondylosis with myelopathy 09/18/2014  .  Radiculopathy of arm 08/28/2014  . Hypogonadism male 09/08/2013  . 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 Cardiomyopathy 04/01/2011  . Wolff-Parkinson-White (WPW) syndrome 04/01/2011    Billie Ruddy, PT, DPT [facility] [address] [place], [place], [address] Phone: [phone]   Fax:  [phone] 12/21/2014, 7:23 PM   Name: Mark Gallagher MRN: [id number] Date of Birth: [birth year]

## 2014-12-25 ENCOUNTER — Other Ambulatory Visit: Payer: Self-pay | Admitting: Physical Medicine and Rehabilitation

## 2014-12-25 ENCOUNTER — Encounter: Payer: Self-pay | Admitting: Occupational Therapy

## 2014-12-25 ENCOUNTER — Ambulatory Visit: Payer: Managed Care, Other (non HMO) | Admitting: Occupational Therapy

## 2014-12-25 ENCOUNTER — Ambulatory Visit: Payer: Managed Care, Other (non HMO) | Admitting: Physical Therapy

## 2014-12-25 DIAGNOSIS — R2681 Unsteadiness on feet: Secondary | ICD-10-CM

## 2014-12-25 DIAGNOSIS — M6281 Muscle weakness (generalized): Secondary | ICD-10-CM

## 2014-12-25 DIAGNOSIS — M6289 Other specified disorders of muscle: Secondary | ICD-10-CM | POA: Diagnosis not present

## 2014-12-25 DIAGNOSIS — R269 Unspecified abnormalities of gait and mobility: Secondary | ICD-10-CM

## 2014-12-25 DIAGNOSIS — R29898 Other symptoms and signs involving the musculoskeletal system: Secondary | ICD-10-CM

## 2014-12-25 DIAGNOSIS — R279 Unspecified lack of coordination: Secondary | ICD-10-CM

## 2014-12-25 DIAGNOSIS — R278 Other lack of coordination: Secondary | ICD-10-CM

## 2014-12-25 DIAGNOSIS — R531 Weakness: Secondary | ICD-10-CM

## 2014-12-25 NOTE — Therapy (Signed)
[facility] [address] [place], [place], [address] Phone: [phone]   Fax:  [phone]  Occupational Therapy Treatment  Patient Details  Name: Mark Gallagher MRN: [id number] Date of Birth: 09-Jun-1970 No Data Recorded  Encounter Date: 12/25/2014      OT End of Session - 12/25/14 1446    Visit Number 23   Number of Visits 33   Date for OT Re-Evaluation 01/29/15   Authorization Type A[employer], 60 visit limit combined   Authorization Time Period renewal completed 11/30/14, wk 3/8   Authorization - Visit Number 23   Authorization - Number of Visits 30   OT Start Time 2595   OT Stop Time 1452   OT Time Calculation (min) 45 min   Activity Tolerance Patient tolerated treatment well   Behavior During Therapy [facility] for tasks assessed/performed      Past Medical History  Diagnosis Date  . Thyroid disease   . Cancer ([place])   . 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 Testosterone deficiency   . Knee pain, bilateral   . History of pituitary tumor     Past Surgical History  Procedure Laterality Date  . Cervical disc surgery  2007    Anterior C4/5 vertebrectomy and c3-C6 laminoplasty with plating  . Craniotomy for tumor      pituitary adenoma    There were no vitals filed for this visit.  Visit Diagnosis:  Muscle weakness (generalized)  Left hand weakness  Decreased coordination      Subjective Assessment - 12/25/14 1626    Subjective  Pt reports that he goes back to work Monday.   Patient is accompained by: Family member   Pertinent History C3-T2 fusion 09/10/14 with cardiac surgery after surgery, hx of previous C1-C6 fusion (2007)   Limitations cleared for overhead reaching; lifting restriction of 30lbs, cleared for shoulder strengthening, **Do not work on neck motion   Patient Stated Goals return to work   Currently in Pain? Yes   Pain Score 4    Pain Location Neck   Pain Descriptors / Indicators Sore   Pain Type Chronic  pain   Pain Frequency Constant   Aggravating Factors  ?   Pain Relieving Factors medication                      OT Treatments/Exercises (OP) - 12/25/14 0001    Hand Exercises   Other Hand Exercises Picking up blocks using 55lbs grip strength and incr to 75lbs for last 10 blocks with min difficulty.      checked grip strength--see goals section. Began discussing progress and plan to schedule out remaining visits for the year (within visit limit).          OT Education - 12/25/14 1629    Education Details Discussed turning upper trunk instead of just neck if needed at work with neck strain; Updated HEP (yellow/red theraband, end range cane HEP) and performed with each UE--see pt instructions   Person(s) Educated Patient;Parent(s)   Methods Explanation;Demonstration;Handout;Verbal cues;Tactile cues   Comprehension Verbalized understanding;Returned demonstration;Verbal cues required          OT Short Term Goals - 12/25/14 1641    OT SHORT TERM GOAL #1   Title Pt will verbalize understanding of coordination/hand strength HEP--check STGs 11/08/14   Time 4   Period Weeks   Status Achieved   OT SHORT TERM GOAL #2   Title Pt will perfom BADLs mod I.  Baseline min A bathing   Time 4   Period Weeks   Status Achieved  11/06/14  supervision for shower transfer, min A for R brace/neck brace;  11/27/14 met   OT SHORT TERM GOAL #3   Title Pt will improve L hand coordination for ADLs as shown by completing 9-hole peg test in less than 55sec.   Baseline 77.34sec   Time 4   Period Weeks   Status Achieved  11/06/14:  56.94sec;  11/27/14  45.69sec   OT SHORT TERM GOAL #4   Title Pt will demo at least 40lbs L grip strength for ADLs/opening containers.   Baseline 30lbs   Time 4   Period Weeks   Status Achieved  11/06/14:  45lbs   OT SHORT TERM GOAL #5   Title Pt will improve L hand coordination for ADLs as shown by completing 9-hole peg test in less than  40sec.--check 12/29/14   Time 4   Period Weeks   Status New   OT SHORT TERM GOAL #6   Title Pt will be able to perform overhead reaching with LUE for at least 53mn without rest break for incr activity tolerance.--check 12/29/14   Time 4   Period Weeks   Status New   OT SHORT TERM GOAL #7   Title Pt will demo at least 55lbs L grip strength for ADLs/opening containers.--check 12/29/14   Time 4   Period Weeks   Status On-going  12/25/14:  49lbs           OT Long Term Goals - 12/25/14 1440    OT LONG TERM GOAL #1   Title Pt will be independent with proximal UE HEP when cleared from cervical/lifting precautions.--check updated LTGs 01/29/15   Status On-going  11/27/14 pt not yet cleared from cervical precautions   OT LONG TERM GOAL #2   Title Pt will perform simple meal prep/home maintenance tasks mod I.   Status On-going  not fully met 11/30/14   OT LONG TERM GOAL #3   Title Pt will improve L hand coordination for ADLs as shown by completing 9-hole peg test in less than 37sec.   Baseline 47.88secs  11/21/14; 11/27/14  45.69sec  (revised goal to <37 instead of less than 45sec for renewal)   Status Revised   OT LONG TERM GOAL #4   Title Pt will demo at least 60lbs L grip strength for ADLs/opening containers.   Baseline revised to 60 for renewal 11/30/14   Status Revised  11/27/14:  49lbs; 12/25/14  49lbs   OT LONG TERM GOAL #5   Title Pt will be able to retrieve 4lb object from overhead shelf with LUE.   Time 8   Period Weeks   Status New               Plan - 12/25/14 1636    Clinical Impression Statement Pt is progressing towards goals.  Pt continues to demo LUE weakness and decr L shoulder end ROM.  Pt also reports decr activity tolerance overall.  Pt plans to return to work next week for 4hrs/day.   Plan review updated HEP, check STGs   OT Home Exercise Plan Education issued: coordination 10/12/14, cane HEP and yellow band (bicep/triceps); 12/25/14 updated HEP    Consulted  and Agree with Plan of Care Patient;Family member/caregiver   Family Member Consulted mother        Problem List Patient Active Problem List   Diagnosis Date Noted  . Adjustment disorder with  depressed mood   . Tetraplegia ([place]) 09/19/2014  . Acute blood loss anemia 09/19/2014  . Cervical spondylosis with myelopathy 09/18/2014  . Radiculopathy of arm 08/28/2014  . Hypogonadism male 09/08/2013  . 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 Cardiomyopathy 04/01/2011  . Wolff-Parkinson-White (WPW) syndrome 04/01/2011    [facility] 12/25/2014, 4:45 PM  [facility] [address] [place], [place], [address] Phone: [phone]   Fax:  [phone]  Name: Mark Gallagher MRN: [id number] Date of Birth: 05-Nov-1970  Vianne Bulls, OTR/L 12/25/2014 4:45 PM

## 2014-12-25 NOTE — Patient Instructions (Addendum)
Strengthening: Resisted Extension   Attach one end to door.  Hold tubing in one hand, arm forward. Pull arm back, elbow straight.  RED Repeat 10-15 times per set. Do 2 sessions per day.   Resisted Horizontal Abduction: Bilateral   Sit or stand, tubing in both hands, arms out in front. Keeping arms straight, pinch shoulder blades together and stretch arms out. Repeat 10-15 times per set.  Do 2 sessions per day.  YELLOW   Elbow Flexion: Resisted   Hold tubing wrapped around  One hand and and other end secured under foot, curl arm up as far as possible. Repeat 15 times per set.  Do 2 sessions per day.  RED    Elbow Extension: Resisted   Hold band in left hand with elbow bent. Straighten  other elbow. Repeat 15 times per set.  Do 2 sessions per day.  RED   **REPEAT ALL WITH BOTH ARMS.   Shoulder Flexion: Sitting (Single Arm)    Anchor tubing under foot. Same side hand thumb up, raise arm forward. Repeat 10 times per set. Repeat with other arm. Do 2 sets per day.  YELLOW  http://tub.exer.us/281   Copyright  VHI. All rights reserved.  ABDUCTION: Standing - Stable: Exercise Band (Active)    Stand, right arm down. Against yellow resistance band, lift arm out to side and up as high as possible, keeping elbow straight and THUMB UP. Complete 10 repetitions. Perform 2 sessions per day.  YELLOW  Copyright  VHI. All rights reserved.    Abduction (Eccentric) - Active (Cane)    Lift cane out to side with affected arm. Avoid hiking shoulder. Keep palm relaxed. Slowly lower affected arm for 3-5 seconds. 15 reps per set, 2 TIMES/DAY.  KEEP THUMB FACING UP.   http://ecce.exer.us/164   Copyright  VHI. All rights reserved.  Cane Overhead - Standing    With arms straight, hold cane forward at waist. Raise cane above head. Hold ___ seconds. Repeat 15 times. Do 2 times per day.  HOLD WITH THUMBS FACING UP

## 2014-12-25 NOTE — Therapy (Signed)
[facility] [address] [place], [place], [address] Phone: [phone]   Fax:  [phone]  Physical Therapy Treatment  Patient Details  Name: Mark Gallagher MRN: [id number] Date of Birth: Sep 24, 1970 No Data Recorded  Encounter Date: 12/25/2014      PT End of Session - 12/25/14 1950    Visit Number 27   Number of Visits 37   Date for PT Re-Evaluation 02/03/15   Authorization Type Aetna    Authorization - Visit Number 27   Authorization - Number of Visits 30   PT Start Time 9449   PT Stop Time 1402   PT Time Calculation (min) 45 min   Equipment Utilized During Treatment Gait belt   Activity Tolerance Patient tolerated treatment well   Behavior During Therapy [facility] for tasks assessed/performed      Past Medical History  Diagnosis Date  . Thyroid disease   . Cancer ([place])   . 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 Testosterone deficiency   . Knee pain, bilateral   . History of pituitary tumor     Past Surgical History  Procedure Laterality Date  . Cervical disc surgery  2007    Anterior C4/5 vertebrectomy and c3-C6 laminoplasty with plating  . Craniotomy for tumor      pituitary adenoma    There were no vitals filed for this visit.  Visit Diagnosis:  Abnormality of gait  Weakness generalized  Unsteadiness      Subjective Assessment - 12/25/14 1323    Subjective Pt reports no significant changes since last session. Pt is planning to start work next Monday beginning with 4 hours per day.   Patient is accompained by: Family member  mother   Pertinent History * Restrictions: no lifting > 30 lbs; do not work on neck ROM.  PMH: cervical spondylosis with myelopathy; s/p cervical vertebrectomy C4/5 with C3-6 plating 2007 acromegaly s/p pituitary tumor resection (1994); hypogonadism; Wolff-Parkinson-White Syndrome; cardiomyopathy; acute blood loss anemia; adjustment disorder with depressed mood   Patient Stated Goals "I'd  like to be able to walk by myself with no assistance, stand up out of a chair by myself, be able to get up and down stairs without assistance.   Currently in Pain? Yes   Pain Score 4    Pain Location Neck   Pain Orientation Posterior;Mid   Pain Descriptors / Indicators Sore   Pain Type Chronic pain   Pain Onset More than a month ago   Pain Frequency Constant   Aggravating Factors  pulling/pushing with UE's (less than 30 lbs)   Pain Relieving Factors medication   Multiple Pain Sites No                         [facility] Adult PT Treatment/Exercise - 12/25/14 1940    Ambulation/Gait   Ambulation/Gait Yes   Ambulation/Gait Assistance 5: Supervision;4: Min guard;4: Min assist;6: Modified independent (Device/Increase time)   Ambulation/Gait Assistance Details mod I using LBQC. Using SPC, mod I over level, indoor surfaces and (S) over paved, unlevel surfaces. min A over both level (x115') and unlevel (x15') surfaces without AD.   Ambulation Distance (Feet) 775 Feet  575' indoors + 200' outdoors   Door Management 5: Supervision;4: Min assist   Door Managment Details (indicate cue type and reason) (S) for lighter door; Min A for heavier door  using bari SPC   Ramp 4: Min assist;5: Supervision   Ramp Details (indicate cue  type and reason) with close (S) to min guard using SPC; min A without AD   Curb 5: Supervision;4: Min assist   Curb Details (indicate cue type and reason) x2 trials with [facility] initial trial with min guard; subsequent trial with close (S)   Gait Comments Gait x575' indoors: x230' without AD with mulitmodal cueing to promote L pelvic protraction, L Trendelenburg (less prominent with slower gait speed and cueing for L gluteus medius activation during L stance); x345' with bariatric SPC.   Neuro Re-ed    Neuro Re-ed Details  Pt performed the following in tall kneeling without UE support to increase trunk control, improve proximal LE stability/control (focus on LLE stance  stability): lateral stepping x3-4 steps per direction to retrieve 6 bowling pins x4 trials in each direction. Cueing focused on upright posture, trunk control, and LLE placement; tactile cueing at L iliac crest for LLE proprioceptive input.                 PT Education - 12/25/14 1941    Education provided Yes   Education Details Pt now safe to ambulate household distances using bari SPC. Continue to recommend bari [facility] for all community mobility.   Person(s) Educated Patient;Parent(s)   Methods Explanation   Comprehension Verbalized understanding          PT Short Term Goals - 12/08/14 1451    PT SHORT TERM GOAL #1   Title Pt will perform initial HEP with mod I using paper handout to indicate safe HEP compliance, maximize functional gains made in PT. Target date: 11/06/14   Baseline Met 9/22.   Status Achieved   PT SHORT TERM GOAL #2   Title Perform 6 Minute Walk Test to assess/address functional endurance. Target date: 11/06/14   Baseline 9/20: 556' with RW   Status Achieved   PT SHORT TERM GOAL #3   Title Perform TUG to assess/address efficiency of functional mobility. Target date: 11/06/14   Baseline 9/20: TUG time = 28.3 seconds with RW   Status Achieved   PT SHORT TERM GOAL #4   Title Pt will ambulate 200' over level, indoor surfaces with mod I using LRAD to indicate increased safety/indepedence with household ambulation. Target date: 11/06/14   Baseline Met 10/12 with bariatric RW.   Status Achieved   PT SHORT TERM GOAL #5   Title Pt will negotiate 14 stairs with B rails and min A of single person to indicate increased independence accessing second floor of home. Target date: 11/06/14   Baseline 10/10: required min guard of PT for stairs x16   Status Achieved   PT SHORT TERM GOAL #6   Title Complete Berg Balance Scale and improve score by 4 points from baseline to progress toward significant improvement in functional standing balance. Target date: 01/02/15    Baseline 11/18: Berg score = 34/56   Status Achieved   PT SHORT TERM GOAL #7   Title Pt will improve gait velocity from 1.94 ft/sec to 2.24 ft/sec to indicate increased efficiency of ambulation. Target date: 01/02/15   Baseline 11/15: 1.94 ft/sec using LBQC   Status New   PT SHORT TERM GOAL #8   Title Pt will ambulate 51' over level, indoor surfaces without AD requiring supervision to progress toward independence with household mobility. Target date: 01/02/15   Status New           PT Long Term Goals - 12/08/14 1452    PT LONG TERM GOAL #1   Title  Pt will improve 6 Minute Walk distance by 150' from baseline to indicate significant improvement in functional endurance. Target date: 12/04/14   Baseline 9/20: baseline 6MWT distance = 556' with RW;  11/15: 6MWT distance = 910 ' with RW   Status Achieved   PT LONG TERM GOAL #2   Title Pt will improve TUG score by 10.8 seconds from baseline to indicate significant improvement in efficiency of functional mobility.  Target date: 12/04/14   Baseline 9/20: baseline TUG = 28.4 sec with RW;  11/15: TUG = 17.67 with RW   Status Achieved   PT LONG TERM GOAL #3   Title Pt will ambulate 500' over unlevel, paved surfaces with mod I using LRAD to indicate increased stability/independence with community mobility. Target date: 12/04/14   Baseline Met 11/10 with LBQC.   Status Achieved   PT LONG TERM GOAL #4   Title Pt will negotiate standard ramp and curb step with mod I using LRAD to indicate increased safety/indepedence traversing community obstacles. Target date: 12/04/14   Baseline Met 11/10 with LBQC.   Status Achieved   PT LONG TERM GOAL #5   Title Pt will negotiate 14 stairs with 2 rails and mod I to indicate increased independence accessing second floor of home.  Target date: 12/04/14   Baseline Met 11/27/14.   Status Achieved   PT LONG TERM GOAL #6   Title Pt will improve Berg score by 8 points from baseline to indicate significant  improvement in functional standing balance. Target date: 01/30/15   Baseline 11/18: baseline Berg score = 34/56   Status New   PT LONG TERM GOAL #7   Title Pt will improve gait velocity to > 2.62 ft/sec to indicate status of community ambulatory.  Target date: 01/30/15   Status New   PT LONG TERM GOAL #8   Title Pt will perform TUG in < 13.5 seconds to indicate decreased fall risk. Target date: 01/30/15   Status New   PT LONG TERM GOAL  #9   TITLE Pt will ambulate 31' over level, indoor surfaces without AD with mod I to indicate increased stability/independence with household ambulation.  Target date: 01/30/15   Status New               Plan - 12/25/14 1951    Clinical Impression Statement Skilled session focused on gait training with SPC, increasing trunk control and proximal stability/control. Pt now safe to utilizing [facility] for household mobility; recommending continued use of bariatric [facility] for all community mobility. Pt/mother verbalized understanding.   Pt will benefit from skilled therapeutic intervention in order to improve on the following deficits Abnormal gait;Decreased activity tolerance;Decreased balance;Decreased endurance;Decreased coordination;Decreased mobility;Decreased strength;Impaired sensation;Pain   Rehab Potential Good   PT Frequency 2x / week   PT Duration 8 weeks   PT Treatment/Interventions Vestibular;ADLs/Self Care Home Management;DME Instruction;Balance training;Therapeutic exercise;Therapeutic activities;Functional mobility training;Stair training;Gait training;Neuromuscular re-education;Patient/family education;Electrical Stimulation   PT Next Visit Plan Focus on strengthening/stability of core, L glute med/max.  NMR with focus on increasing proprioceptive, visual input to improve LLE placement, L stance stability during gait.   Consulted and Agree with Plan of Care Patient;Family member/caregiver   Family Member Consulted mother        Problem  List Patient Active Problem List   Diagnosis Date Noted  . Adjustment disorder with depressed mood   . Tetraplegia ([place]) 09/19/2014  . Acute blood loss anemia 09/19/2014  . Cervical spondylosis with myelopathy 09/18/2014  . Radiculopathy  of arm 08/28/2014  . Hypogonadism male 09/08/2013  . 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 Cardiomyopathy 04/01/2011  . Wolff-Parkinson-White (WPW) syndrome 04/01/2011   Billie Ruddy, PT, DPT [facility] [address] [place], [place], [address] Phone: [phone]   Fax:  [phone] 12/25/2014, 7:55 PM   Name: Mark Gallagher MRN: [id number] Date of Birth: 1970/12/13

## 2014-12-27 ENCOUNTER — Ambulatory Visit: Payer: Managed Care, Other (non HMO) | Admitting: Occupational Therapy

## 2014-12-27 ENCOUNTER — Encounter
Payer: Managed Care, Other (non HMO) | Attending: Physical Medicine & Rehabilitation | Admitting: Physical Medicine & Rehabilitation

## 2014-12-27 ENCOUNTER — Ambulatory Visit: Payer: Managed Care, Other (non HMO) | Admitting: Physical Therapy

## 2014-12-27 ENCOUNTER — Encounter: Payer: Self-pay | Admitting: Physical Medicine & Rehabilitation

## 2014-12-27 VITALS — BP 111/70 | HR 46 | Resp 16

## 2014-12-27 DIAGNOSIS — R42 Dizziness and giddiness: Secondary | ICD-10-CM | POA: Diagnosis not present

## 2014-12-27 DIAGNOSIS — R269 Unspecified abnormalities of gait and mobility: Secondary | ICD-10-CM

## 2014-12-27 DIAGNOSIS — M4804 Spinal stenosis, thoracic region: Secondary | ICD-10-CM | POA: Diagnosis not present

## 2014-12-27 DIAGNOSIS — M6281 Muscle weakness (generalized): Secondary | ICD-10-CM

## 2014-12-27 DIAGNOSIS — M4712 Other spondylosis with myelopathy, cervical region: Secondary | ICD-10-CM

## 2014-12-27 DIAGNOSIS — R531 Weakness: Secondary | ICD-10-CM | POA: Insufficient documentation

## 2014-12-27 DIAGNOSIS — G8250 Quadriplegia, unspecified: Secondary | ICD-10-CM | POA: Diagnosis not present

## 2014-12-27 DIAGNOSIS — D352 Benign neoplasm of pituitary gland: Secondary | ICD-10-CM | POA: Diagnosis not present

## 2014-12-27 DIAGNOSIS — M25362 Other instability, left knee: Secondary | ICD-10-CM | POA: Diagnosis not present

## 2014-12-27 DIAGNOSIS — M25562 Pain in left knee: Secondary | ICD-10-CM | POA: Insufficient documentation

## 2014-12-27 DIAGNOSIS — E079 Disorder of thyroid, unspecified: Secondary | ICD-10-CM | POA: Insufficient documentation

## 2014-12-27 DIAGNOSIS — M21372 Foot drop, left foot: Secondary | ICD-10-CM | POA: Insufficient documentation

## 2014-12-27 DIAGNOSIS — E220 Acromegaly and pituitary gigantism: Secondary | ICD-10-CM | POA: Diagnosis not present

## 2014-12-27 DIAGNOSIS — R202 Paresthesia of skin: Secondary | ICD-10-CM | POA: Diagnosis not present

## 2014-12-27 DIAGNOSIS — C801 Malignant (primary) neoplasm, unspecified: Secondary | ICD-10-CM | POA: Insufficient documentation

## 2014-12-27 DIAGNOSIS — M6289 Other specified disorders of muscle: Secondary | ICD-10-CM | POA: Diagnosis not present

## 2014-12-27 DIAGNOSIS — R296 Repeated falls: Secondary | ICD-10-CM | POA: Diagnosis not present

## 2014-12-27 DIAGNOSIS — M4802 Spinal stenosis, cervical region: Secondary | ICD-10-CM | POA: Diagnosis not present

## 2014-12-27 DIAGNOSIS — Z79899 Other long term (current) drug therapy: Secondary | ICD-10-CM | POA: Insufficient documentation

## 2014-12-27 DIAGNOSIS — R278 Other lack of coordination: Secondary | ICD-10-CM

## 2014-12-27 DIAGNOSIS — M25561 Pain in right knee: Secondary | ICD-10-CM | POA: Diagnosis not present

## 2014-12-27 DIAGNOSIS — K5900 Constipation, unspecified: Secondary | ICD-10-CM | POA: Diagnosis not present

## 2014-12-27 DIAGNOSIS — R29898 Other symptoms and signs involving the musculoskeletal system: Secondary | ICD-10-CM

## 2014-12-27 DIAGNOSIS — H8113 Benign paroxysmal vertigo, bilateral: Secondary | ICD-10-CM | POA: Diagnosis not present

## 2014-12-27 DIAGNOSIS — R279 Unspecified lack of coordination: Secondary | ICD-10-CM

## 2014-12-27 NOTE — Progress Notes (Signed)
Subjective:    Patient ID: Mark Gallagher, male    DOB: 02-Nov-1970, 44 y.o.   MRN: [id number]  HPI   Mark Gallagher is here in follow up of his cervical myelopathy. He has been participating in therapies on an outpatient basis. He is going to start back to work next week on a part time basis next week. He will be working 4 hour days intitially.   His pain is fairly well controlled. He still has some tingling in his left hand which is a nuisance.  He is off the tramadol and using alleve one per day.   He is using a quad cane currently. Therapy is trying to advance him to a straight cane.   His vestibular sx have improved with treatment.   Pain Inventory Average Pain 3 Pain Right Now 3 My pain is aching  In the last 24 hours, has pain interfered with the following? General activity 2 Relation with others 1 Enjoyment of life 1 What TIME of day is your pain at its worst? evening Sleep (in general) Good  Pain is worse with: bending Pain improves with: therapy/exercise Relief from Meds: 7  Mobility walk with assistance use a cane how many minutes can you walk? NA ability to climb steps?  yes do you drive?  yes Do you have any goals in this area?  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  no  Physicians involved in your care Primary care Dr. Laney Pastor Neurosurgeon Dr. Tommi Rumps Orthopedist Dr. Marvel Plan   Family History  Problem Relation Age of Onset  . High blood pressure Mother   . Acute myelogenous leukemia Brother    Social History   Social History  . Marital Status: Single    Spouse Name: N/A  . Number of Children: N/A  . Years of Education: N/A   Social History Main Topics  . Smoking status: Never Smoker   . Smokeless tobacco: None  .  Alcohol Use: No  . Drug Use: No  . Sexual Activity: Not Asked   Other Topics Concern  . None   Social History Narrative   Past Surgical History  Procedure Laterality Date  . Cervical disc surgery  2007    Anterior C4/5 vertebrectomy and c3-C6 laminoplasty with plating  . Craniotomy for tumor      pituitary adenoma   Past Medical History  Diagnosis Date  . Thyroid disease   . Cancer ([place])   . 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 Testosterone deficiency   . Knee pain, bilateral   . History of pituitary tumor    BP 111/70 mmHg  Pulse 46  Resp 16  SpO2 98%  Opioid Risk Score:   Fall Risk Score:  `1  Depression screen PHQ 2/9  Depression screen [facility] 2/9 10/19/2014 08/23/2014 09/08/2013  Decreased Interest 0 0 0  Down, Depressed, Hopeless 0 0 0  PHQ - 2 Score 0 0 0     Review of Systems  All other systems reviewed and are negative.      Objective:   Physical Exam  Constitutional: He is oriented to person, place, and time. He appears well-developed and well-nourished. Large frame/head HENT: thrush on tongue Head: Atraumatic.  Eyes: Conjunctivae and EOM are normal. Pupils are equal, round, and reactive to  light. Right eye exhibits no discharge.  Neck:  Cervical collar fitting appropriately. New pads Cardiovascular: Normal rate and regular rhythm.  No murmur heard. Respiratory: Effort normal and breath sounds normal. No respiratory distress. He has no wheezes. He exhibits no tenderness.  GI: Soft. Bowel sounds are normal. He exhibits no distension. There is no tenderness.  Musculoskeletal: He exhibits improving edema (trace pedal edema bilaterally---none in legs). no edema left hand  Pain over left pec with palpation is improved Neurological: He is alert and oriented to person, place, and time. RUE: 5/5 deltoid, bicep, tricep, HI. LUE: 4+/5 delt, bicep, tricep, 4+wrist and HI. Positive left pronator drift. Sensation 1+/2 LUE and LLE. RLE: 4 HF, 4 to 4+/5 KE and ADF/APF.  LLE: 4HF and KE and 1+ to 2/5 ADF/APF. Sensation 1++/2 left foot to LT and pain. Improved balance with quad cane.  Skin: Skin is warm and dry.  Psychiatric: He has a normal mood and affect. His behavior is normal. Judgment and thought content normal.        Assessment & Plan:  Medical Problem List and Plan: 1. Functional deficits secondary to C6 SCI with incomplete myelopathy  -continue with outpt therapies.  -recommend ongoing use of AFO and cane for now---could advance to straight cane  -may be an aquatic therapy candidate down the line.  -he is ok to return to part time work as long as he can sit the majority of the time.  3. Pain Management: ALLEVE only.  4. Hypogonadism: Depotestosterone per home regimen    13. H/o Acromegaly: Treated with Sandostatin? 14. Dysphagia: -resolved   15. Edema:  Resolved 16. Vertigo: resolved with therapy and HEP  Follow up with me in 3 months. Thirty minutes of face to face patient care time were spent during this visit. All questions were encouraged and answered.

## 2014-12-27 NOTE — Patient Instructions (Signed)
PLEASE CALL ME WITH ANY PROBLEMS OR QUESTIONS (#[phone]). HAVE A HAPPY HOLIDAY SEASON!!!    

## 2014-12-27 NOTE — Patient Instructions (Signed)
Medical supply stores: [facility] on [address] [phone]  [facility] on [address] or in [place] [phone] for both locations  [facility] on [address] [phone] 

## 2014-12-28 NOTE — Therapy (Signed)
[facility] [address] [place], [place], [address] Phone: [phone]   Fax:  [phone]  Occupational Therapy Treatment  Patient Details  Name: Mark Gallagher MRN: [id number] Date of Birth: [birth year] No Data Recorded  Encounter Date: 12/27/2014      OT End of Session - 12/27/14 1604    Visit Number 24   Number of Visits 33   Date for OT Re-Evaluation 01/29/15   Authorization Type [employer], 60 visit limit combined   Authorization Time Period renewal completed 11/30/14, wk 3/8   Authorization - Visit Number 24   Authorization - Number of Visits 30   OT Start Time 1451   OT Stop Time 1530   OT Time Calculation (min) 39 min   Activity Tolerance Patient tolerated treatment well   Behavior During Therapy [facility] for tasks assessed/performed      Past Medical History  Diagnosis Date  . Thyroid disease   . Cancer (HCC)   . Acromegaly (HCC)   . Testosterone deficiency   . Knee pain, bilateral   . History of pituitary tumor     Past Surgical History  Procedure Laterality Date  . Cervical disc surgery  2007    Anterior C4/5 vertebrectomy and c3-C6 laminoplasty with plating  . Craniotomy for tumor      pituitary adenoma    There were no vitals filed for this visit.  Visit Diagnosis:  Muscle weakness (generalized)  Left hand weakness  Decreased coordination      Subjective Assessment - 12/27/14 1606    Pertinent History C3-T2 fusion 09/10/14 with cardiac surgery after surgery, hx of previous C1-C6 fusion (2007)   Limitations cleared for overhead reaching; lifting restriction of 30lbs, cleared for shoulder strengthening, **Do not work on neck motion   Patient Stated Goals return to work   Currently in Pain? Yes   Pain Location Shoulder   Pain Orientation Right;Left   Pain Descriptors / Indicators Sore   Pain Type Acute pain   Pain Onset Yesterday   Pain Frequency Intermittent   Aggravating Factors   theraband ex   Pain Relieving Factors repositioing   Multiple Pain Sites No        Treatment: Therapist reviewed theraband HEP with pt/ mother and pt returned demonstration, pt's mother verbalized understanding. Functional overhead reaching x 10 mins, with LUE without rest break, moderate drops of cylindrical objects, pt reports sensation is still impaired in LUE. Gripper set at 55 lbs to pick up 1 inch blocks, min drops/ difficulty.                        OT Short Term Goals - 12/27/14 1557    OT SHORT TERM GOAL #1   Title Pt will verbalize understanding of coordination/hand strength HEP--check STGs 11/08/14   Time 4   Period Weeks   Status Achieved   OT SHORT TERM GOAL #2   Title Pt will perfom BADLs mod I.   Baseline min A bathing   Time 4   Period Weeks   Status Achieved  11/06/14  supervision for shower transfer, min A for R brace/neck brace;  11/27/14 met   OT SHORT TERM GOAL #3   Title Pt will improve L hand coordination for ADLs as shown by completing 9-hole peg test in less than 55sec.   Baseline 77.34sec   Time 4   Period Weeks   Status Achieved  11/06/14:  56.94sec;  11/27/14  45.69sec   OT SHORT TERM GOAL #4   Title Pt will demo at least 40lbs L grip strength for ADLs/opening containers.   Baseline 30lbs   Time 4   Period Weeks   Status Achieved  11/06/14:  45lbs   OT SHORT TERM GOAL #5   Title Pt will improve L hand coordination for ADLs as shown by completing 9-hole peg test in less than 40sec.--check 12/29/14   Baseline 45.56 secs, 34.12 secs   Time 4   Period Weeks   Status Partially Met   OT SHORT TERM GOAL #6   Title Pt will be able to perform overhead reaching with LUE for at least 32mn without rest break for incr activity tolerance.--check 12/29/14   Time 4   Period Weeks   Status Achieved   OT SHORT TERM GOAL #7   Title Pt will demo at least 55lbs L grip strength for ADLs/opening containers.--check 12/29/14   Time 4   Period  Weeks   Status On-going  12/25/14:  49lbs           OT Long Term Goals - 12/25/14 1440    OT LONG TERM GOAL #1   Title Pt will be independent with proximal UE HEP when cleared from cervical/lifting precautions.--check updated LTGs 01/29/15   Status On-going  11/27/14 pt not yet cleared from cervical precautions   OT LONG TERM GOAL #2   Title Pt will perform simple meal prep/home maintenance tasks mod I.   Status On-going  not fully met 11/30/14   OT LONG TERM GOAL #3   Title Pt will improve L hand coordination for ADLs as shown by completing 9-hole peg test in less than 37sec.   Baseline 47.88secs  11/21/14; 11/27/14  45.69sec  (revised goal to <37 instead of less than 45sec for renewal)   Status Revised   OT LONG TERM GOAL #4   Title Pt will demo at least 60lbs L grip strength for ADLs/opening containers.   Baseline revised to 60 for renewal 11/30/14   Status Revised  11/27/14:  49lbs; 12/25/14  49lbs   OT LONG TERM GOAL #5   Title Pt will be able to retrieve 4lb object from overhead shelf with LUE.   Time 8   Period Weeks   Status New               Plan - 12/28/14 1516    Clinical Impression Statement Pt is progressing towards goals. He demonstrates understanding of theraband HEP after review. He continues to demonstrate limitations in left shoulder ROM and  strength.   Pt will benefit from skilled therapeutic intervention in order to improve on the following deficits (Retired) Decreased activity tolerance;Decreased balance;Decreased mobility;Decreased strength;Pain;Impaired UE functional use;Decreased knowledge of use of DME;Decreased coordination   Rehab Potential Good   OT Frequency 2x / week   OT Duration 8 weeks   Plan continue to address strength and LUE shoulder A/ROM   Consulted and Agree with Plan of Care Patient        Problem List Patient Active Problem List   Diagnosis Date Noted  . Adjustment disorder with depressed mood   . [place]  09/19/2014  . Acute blood loss anemia 09/19/2014  . Cervical spondylosis with myelopathy 09/18/2014  . Radiculopathy of arm 08/28/2014  . Hypogonadism male 09/08/2013  . [place] ([place] 04/01/2011  . Cardiomyopathy 04/01/2011  . Wolff-Parkinson-White (WPW) syndrome 04/01/2011    RINE,KATHRYN 12/28/2014, 3:45 PM KTheone Murdoch OTR/L [phone]: [phone] 3:45 PM 12/28/2014 [place] [address] [place], [place], [address] Phone: [phone]   Fax:  [phone]  Name: Mark Gallagher MRN: [id number] Date of Birth: 1970/05/29

## 2014-12-28 NOTE — Therapy (Signed)
[place] [address] [place], [place], [address] Phone: [phone]   Fax:  [phone]  Physical Therapy Treatment  Patient Details  Name: Mark Gallagher MRN: [id number] Date of Birth: [birth year] No Data Recorded  Encounter Date: 12/27/2014      PT End of Session - 12/28/14 0851    Visit Number 28   Number of Visits 37   Date for PT Re-Evaluation 02/03/15   Authorization Type [employer]    Authorization - Visit Number 28   Authorization - Number of Visits 30   PT Start Time 4888   PT Stop Time 1530   PT Time Calculation (min) 42 min   Equipment Utilized During Treatment Gait belt   Activity Tolerance Patient tolerated treatment well   Behavior During Therapy [facility] for tasks assessed/performed      Past Medical History  Diagnosis Date  . Thyroid disease   . Cancer ([place])   . Acromegaly ([place])   . Testosterone deficiency   . Knee pain, bilateral   . History of pituitary tumor     Past Surgical History  Procedure Laterality Date  . Cervical disc surgery  2007    Anterior C4/5 vertebrectomy and c3-C6 laminoplasty with plating  . Craniotomy for tumor      pituitary adenoma    There were no vitals filed for this visit.  Visit Diagnosis:  Abnormality of gait  Muscle weakness (generalized)  Decreased coordination      Subjective Assessment - 12/27/14 1456    Subjective Pt saw Dr. Naaman Plummer yesterday. Appt. went well. Dr. Naaman Plummer very happy with pt's functional progress.   Patient is accompained by: Family member   Pertinent History * Restrictions: no lifting > 30 lbs; do not work on neck ROM.  PMH: cervical spondylosis with myelopathy; s/p cervical vertebrectomy C4/5 with C3-6 plating 2007 acromegaly s/p pituitary tumor resection (1994); hypogonadism; Wolff-Parkinson-White Syndrome; cardiomyopathy; acute blood loss anemia; adjustment disorder with depressed mood   Patient Stated Goals "I'd like to be able to  walk by myself with no assistance, stand up out of a chair by myself, be able to get up and down stairs without assistance.   Currently in Pain? Yes   Pain Score 4    Pain Location Shoulder   Pain Orientation Right;Left   Pain Descriptors / Indicators Sore   Pain Type Acute pain   Pain Onset Yesterday   Pain Frequency Constant   Aggravating Factors  elastic band exercises   Pain Relieving Factors medication   Multiple Pain Sites No                         [facility] Adult PT Treatment/Exercise - 12/28/14 0001    Ambulation/Gait   Ambulation/Gait Yes   Ambulation/Gait Assistance 6: Modified independent (Device/Increase time);5: Supervision   Ambulation/Gait Assistance Details Using SPC, pt mod I for gait indoors and supervision for gait over unlevel, paved surfaces (outdoors) x600'. Noted no significant LOB, no toe catches during gait training outdoors.   Ambulation Distance (Feet) 1100 Feet   Assistive device Straight cane  bariatric SPC; L AFO ([place]); L hinged knee brace   Door Management 5: Supervision   Ramp 5: Supervision   Ramp Details (indicate cue type and reason) with SPC   Curb 5: Supervision;4: Min assist   Curb Details (indicate cue type and reason) Using SPC, performed blocked practice of curb step negotiation using SPC x7 reps then  later x3 reps. After trialing different methods of sequencing, noted pt most stable when ascending with RLE, LLE, then SPC and descending with SPC, LLE, then RLE.   Gait Comments Trialed R forearm crutch x50' with (S). Pt preferred SPC to forearm crutch and noted no significant improvement in gait stability using forearm crutch as compared with SPC.                PT Education - 12/28/14 0854    Education provided Yes   Education Details Curb step negotiation with SPC.  Provided information on local medical supply stores to enable pt to purchase bariatric SPC. See Pt Instructions.   Person(s) Educated  Patient;Parent(s)   Methods Explanation;Handout;Demonstration;Verbal cues   Comprehension Verbalized understanding;Returned demonstration          PT Short Term Goals - 12/08/14 1451    PT SHORT TERM GOAL #1   Title Pt will perform initial HEP with mod I using paper handout to indicate safe HEP compliance, maximize functional gains made in PT. Target date: 11/06/14   Baseline Met 9/22.   Status Achieved   PT SHORT TERM GOAL #2   Title Perform 6 Minute Walk Test to assess/address functional endurance. Target date: 11/06/14   Baseline 9/20: 556' with RW   Status Achieved   PT SHORT TERM GOAL #3   Title Perform TUG to assess/address efficiency of functional mobility. Target date: 11/06/14   Baseline 9/20: TUG time = 28.3 seconds with RW   Status Achieved   PT SHORT TERM GOAL #4   Title Pt will ambulate 200' over level, indoor surfaces with mod I using LRAD to indicate increased safety/indepedence with household ambulation. Target date: 11/06/14   Baseline Met 10/12 with bariatric RW.   Status Achieved   PT SHORT TERM GOAL #5   Title Pt will negotiate 14 stairs with B rails and min A of single person to indicate increased independence accessing second floor of home. Target date: 11/06/14   Baseline 10/10: required min guard of PT for stairs x16   Status Achieved   PT SHORT TERM GOAL #6   Title Complete Berg Balance Scale and improve score by 4 points from baseline to progress toward significant improvement in functional standing balance. Target date: 01/02/15   Baseline 11/18: Berg score = 34/56   Status Achieved   PT SHORT TERM GOAL #7   Title Pt will improve gait velocity from 1.94 ft/sec to 2.24 ft/sec to indicate increased efficiency of ambulation. Target date: 01/02/15   Baseline 11/15: 1.94 ft/sec using LBQC   Status New   PT SHORT TERM GOAL #8   Title Pt will ambulate 73' over level, indoor surfaces without AD requiring supervision to progress toward independence with  household mobility. Target date: 01/02/15   Status New           PT Long Term Goals - 12/08/14 1452    PT LONG TERM GOAL #1   Title Pt will improve 6 Minute Walk distance by 150' from baseline to indicate significant improvement in functional endurance. Target date: 12/04/14   Baseline 9/20: baseline 6MWT distance = 556' with RW;  11/15: 6MWT distance = 910 ' with RW   Status Achieved   PT LONG TERM GOAL #2   Title Pt will improve TUG score by 10.8 seconds from baseline to indicate significant improvement in efficiency of functional mobility.  Target date: 12/04/14   Baseline 9/20: baseline TUG = 28.4 sec with RW;  11/15:  TUG = 17.67 with RW   Status Achieved   PT LONG TERM GOAL #3   Title Pt will ambulate 500' over unlevel, paved surfaces with mod I using LRAD to indicate increased stability/independence with community mobility. Target date: 12/04/14   Baseline Met 11/10 with LBQC.   Status Achieved   PT LONG TERM GOAL #4   Title Pt will negotiate standard ramp and curb step with mod I using LRAD to indicate increased safety/indepedence traversing community obstacles. Target date: 12/04/14   Baseline Met 11/10 with LBQC.   Status Achieved   PT LONG TERM GOAL #5   Title Pt will negotiate 14 stairs with 2 rails and mod I to indicate increased independence accessing second floor of home.  Target date: 12/04/14   Baseline Met 11/27/14.   Status Achieved   PT LONG TERM GOAL #6   Title Pt will improve Berg score by 8 points from baseline to indicate significant improvement in functional standing balance. Target date: 01/30/15   Baseline 11/18: baseline Berg score = 34/56   Status New   PT LONG TERM GOAL #7   Title Pt will improve gait velocity to > 2.62 ft/sec to indicate status of community ambulatory.  Target date: 01/30/15   Status New   PT LONG TERM GOAL #8   Title Pt will perform TUG in < 13.5 seconds to indicate decreased fall risk. Target date: 01/30/15   Status New   PT LONG  TERM GOAL  #9   TITLE Pt will ambulate 57' over level, indoor surfaces without AD with mod I to indicate increased stability/independence with household ambulation.  Target date: 01/30/15   Status New               Plan - 12/28/14 0856    Clinical Impression Statement Session focused on continued gait training with [facility] with emphasis on community obstacle negotiation. When using bariatric SPC, pt most stable when ascending curb step with RLE, LLE, then [facility] during this session. Will continue to assess/address. Continue to recommend [facility] for household mobility only, bariatric LBQC for community mobility.   Pt will benefit from skilled therapeutic intervention in order to improve on the following deficits Abnormal gait;Decreased activity tolerance;Decreased balance;Decreased endurance;Decreased coordination;Decreased mobility;Decreased strength;Impaired sensation;Pain   Rehab Potential Good   PT Frequency 2x / week   PT Duration 8 weeks   PT Treatment/Interventions Vestibular;ADLs/Self Care Home Management;DME Instruction;Balance training;Therapeutic exercise;Therapeutic activities;Functional mobility training;Stair training;Gait training;Neuromuscular re-education;Patient/family education;Electrical Stimulation   PT Next Visit Plan Gait training with bariatric SPC. Strengthening/stability of core, L glute med/max.  NMR with focus on increasing proprioceptive, visual input to improve LLE placement, L stance stability during gait.   Consulted and Agree with Plan of Care Patient;Family member/caregiver   Family Member Consulted mother        Problem List Patient Active Problem List   Diagnosis Date Noted  . Adjustment disorder with depressed mood   . Tetraplegia ([place]) 09/19/2014  . Acute blood loss anemia 09/19/2014  . Cervical spondylosis with myelopathy 09/18/2014  . Radiculopathy of arm 08/28/2014  . Hypogonadism male 09/08/2013  . Acromegaly ([place]) 04/01/2011  . Cardiomyopathy  04/01/2011  . Wolff-Parkinson-White (WPW) syndrome 04/01/2011    Billie Ruddy, PT, DPT [facility] [address] [place], [place], [address] Phone: [phone]   Fax:  [phone] 12/28/2014, 9:01 AM   Name: Mark Gallagher MRN: [id number] Date of Birth: October 15, 1970

## 2015-01-01 ENCOUNTER — Ambulatory Visit: Payer: Managed Care, Other (non HMO) | Admitting: Physical Therapy

## 2015-01-01 ENCOUNTER — Ambulatory Visit: Payer: Managed Care, Other (non HMO) | Admitting: Occupational Therapy

## 2015-01-01 DIAGNOSIS — R269 Unspecified abnormalities of gait and mobility: Secondary | ICD-10-CM

## 2015-01-01 DIAGNOSIS — R279 Unspecified lack of coordination: Secondary | ICD-10-CM

## 2015-01-01 DIAGNOSIS — R278 Other lack of coordination: Secondary | ICD-10-CM

## 2015-01-01 DIAGNOSIS — R29898 Other symptoms and signs involving the musculoskeletal system: Secondary | ICD-10-CM

## 2015-01-01 DIAGNOSIS — R42 Dizziness and giddiness: Secondary | ICD-10-CM

## 2015-01-01 DIAGNOSIS — R531 Weakness: Secondary | ICD-10-CM

## 2015-01-01 DIAGNOSIS — M6289 Other specified disorders of muscle: Secondary | ICD-10-CM | POA: Diagnosis not present

## 2015-01-01 DIAGNOSIS — R2681 Unsteadiness on feet: Secondary | ICD-10-CM

## 2015-01-01 NOTE — Therapy (Signed)
[place] [address] [place], [place], [address] Phone: [phone]   Fax:  [phone]  Occupational Therapy Treatment  Patient Details  Name: Mark Gallagher MRN: [id number] Date of Birth: 1970-02-18 No Data Recorded  Encounter Date: 01/01/2015      OT End of Session - 01/01/15 1110    Visit Number 25   Number of Visits 33   Date for OT Re-Evaluation 01/29/15   Authorization Type [employer], 60 visit limit combined   Authorization Time Period renewal completed 11/30/14, wk 5/8   Authorization - Visit Number 25   Authorization - Number of Visits 30   OT Start Time 1104   OT Stop Time 1145   OT Time Calculation (min) 41 min   Activity Tolerance Patient tolerated treatment well   Behavior During Therapy [facility] for tasks assessed/performed      Past Medical History  Diagnosis Date  . Thyroid disease   . Cancer ([place])   . Acromegaly ([place])   . Testosterone deficiency   . Knee pain, bilateral   . History of pituitary tumor     Past Surgical History  Procedure Laterality Date  . Cervical disc surgery  2007    Anterior C4/5 vertebrectomy and c3-C6 laminoplasty with plating  . Craniotomy for tumor      pituitary adenoma    There were no vitals filed for this visit.  Visit Diagnosis:  Weakness generalized  Left hand weakness  Decreased coordination      Subjective Assessment - 01/01/15 1109    Subjective  Pt will go back to work tonight for 4hrs   Patient is accompained by: Family member   Pertinent History C3-T2 fusion 09/10/14 with cardiac surgery after surgery, hx of previous C1-C6 fusion (2007)   Limitations cleared for overhead reaching; lifting restriction of 30lbs, cleared for shoulder strengthening, **Do not work on neck motion   Patient Stated Goals return to work   Currently in Pain? Yes   Pain Score 4    Pain Location --  between scapulas   Pain Orientation Right;Left   Pain Descriptors /  Indicators Sore   Pain Type Acute pain   Aggravating Factors  overhead reaching   Pain Relieving Factors rest                    OT Treatments/Exercises (OP) - 01/01/15 0001    Exercises   Other Information Shoulder circles in supine with 2lb wt. x15 each direction   Shoulder Flexion Self ROM;Left;15 reps;Supine;AAROM  cane   Shoulder ABduction AAROM;Self ROM;Both;Supine  with cane   Shoulder External Rotation Both;Supine;AROM;15 reps  butterfly stretch   Other Exercises 1 Leaning over chair, shoulder extension and abduction with 1lb wt x15 for incr scapular strength.     Other Exercises 2 In supine, shoulder flex with 2lb wt. For stretch and strengthening with min v.c.    Functional Reaching Activities   High Level Functional reaching to place small pegs in vertical pegboard with min v.c. for compensation fpr omcr cpprdomatopm and endurance x2 rows      In sitting, shoulder abduction with 1lb wt in each UE x10 with min v.c. For control/compensation  In standing, shoulder ext with scapular retraction with cane x15 with min v.c.  Arm bike x5 min level 6 for conditioning.             OT Short Term Goals - 12/27/14 1557    OT SHORT TERM GOAL #  1   Title Pt will verbalize understanding of coordination/hand strength HEP--check STGs 11/08/14   Time 4   Period Weeks   Status Achieved   OT SHORT TERM GOAL #2   Title Pt will perfom BADLs mod I.   Baseline min A bathing   Time 4   Period Weeks   Status Achieved  11/06/14  supervision for shower transfer, min A for R brace/neck brace;  11/27/14 met   OT SHORT TERM GOAL #3   Title Pt will improve L hand coordination for ADLs as shown by completing 9-hole peg test in less than 55sec.   Baseline 77.34sec   Time 4   Period Weeks   Status Achieved  11/06/14:  56.94sec;  11/27/14  45.69sec   OT SHORT TERM GOAL #4   Title Pt will demo at least 40lbs L grip strength for ADLs/opening containers.   Baseline 30lbs    Time 4   Period Weeks   Status Achieved  11/06/14:  45lbs   OT SHORT TERM GOAL #5   Title Pt will improve L hand coordination for ADLs as shown by completing 9-hole peg test in less than 40sec.--check 12/29/14   Baseline 45.56 secs, 34.12 secs   Time 4   Period Weeks   Status Partially Met   OT SHORT TERM GOAL #6   Title Pt will be able to perform overhead reaching with LUE for at least 7mn without rest break for incr activity tolerance.--check 12/29/14   Time 4   Period Weeks   Status Achieved   OT SHORT TERM GOAL #7   Title Pt will demo at least 55lbs L grip strength for ADLs/opening containers.--check 12/29/14   Time 4   Period Weeks   Status On-going  12/25/14:  49lbs           OT Long Term Goals - 12/25/14 1440    OT LONG TERM GOAL #1   Title Pt will be independent with proximal UE HEP when cleared from cervical/lifting precautions.--check updated LTGs 01/29/15   Status On-going  11/27/14 pt not yet cleared from cervical precautions   OT LONG TERM GOAL #2   Title Pt will perform simple meal prep/home maintenance tasks mod I.   Status On-going  not fully met 11/30/14   OT LONG TERM GOAL #3   Title Pt will improve L hand coordination for ADLs as shown by completing 9-hole peg test in less than 37sec.   Baseline 47.88secs  11/21/14; 11/27/14  45.69sec  (revised goal to <37 instead of less than 45sec for renewal)   Status Revised   OT LONG TERM GOAL #4   Title Pt will demo at least 60lbs L grip strength for ADLs/opening containers.   Baseline revised to 60 for renewal 11/30/14   Status Revised  11/27/14:  49lbs; 12/25/14  49lbs   OT LONG TERM GOAL #5   Title Pt will be able to retrieve 4lb object from overhead shelf with LUE.   Time 8   Period Weeks   Status New               Plan - 01/01/15 1246    Clinical Impression Statement Pt progressing towards goals.  Pt with compensations noted in trunk with resistive shoulder movements and end ROM L shoulder due to  weakness.   Plan continue to address LUE strength and shoulder AROM.   OT Home Exercise Plan Education issued: coordination 10/12/14, cane HEP and yellow band (bicep/triceps); 12/25/14 updated HEP  Recommended Other Services ------   Consulted and Agree with Plan of Care Patient;Family member/caregiver   Family Member Consulted parents        Problem List Patient Active Problem List   Diagnosis Date Noted  . Adjustment disorder with depressed mood   . Tetraplegia ([place]) 09/19/2014  . Acute blood loss anemia 09/19/2014  . Cervical spondylosis with myelopathy 09/18/2014  . Radiculopathy of arm 08/28/2014  . Hypogonadism male 09/08/2013  . Acromegaly ([place]) 04/01/2011  . Cardiomyopathy 04/01/2011  . Wolff-Parkinson-White (WPW) syndrome 04/01/2011    [facility] 01/01/2015, 12:49 PM  [place] [address] [place], [place], [address] Phone: [phone]   Fax:  [phone]  Name: SHILO PAUWELS MRN: [id number] Date of Birth: 1970-12-28  Vianne Bulls, OTR/L 01/01/2015 12:49 PM

## 2015-01-01 NOTE — Therapy (Signed)
[facility] [address] [place], [place], [address] Phone: [phone]   Fax:  [phone]  Physical Therapy Treatment  Patient Details  Name: Mark Gallagher MRN: [id number] Date of Birth: 1970/10/21 No Data Recorded  Encounter Date: 01/01/2015      PT End of Session - 01/01/15 1958    Visit Number 29   Number of Visits 37   Date for PT Re-Evaluation 02/03/15   Authorization Type [employer]    Authorization - Visit Number 29   Authorization - Number of Visits 30   PT Start Time 1020   PT Stop Time 1100   PT Time Calculation (min) 40 min   Equipment Utilized During Treatment Gait belt   Activity Tolerance Patient tolerated treatment well   Behavior During Therapy WFL for tasks assessed/performed      Past Medical History  Diagnosis Date  . Thyroid disease   . Cancer ([place])   . 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 Testosterone deficiency   . Knee pain, bilateral   . History of pituitary tumor     Past Surgical History  Procedure Laterality Date  . Cervical disc surgery  2007    Anterior C4/5 vertebrectomy and c3-C6 laminoplasty with plating  . Craniotomy for tumor      pituitary adenoma    There were no vitals filed for this visit.  Visit Diagnosis:  Abnormality of gait  Unsteadiness  Dizziness and giddiness      Subjective Assessment - 01/01/15 1023    Subjective Pt reporting ongoing soreness in B shoulder blades. Reports increased dizziness today when getting out of the car.   Patient is accompained by: Family member  parents   Pertinent History * Restrictions: no lifting > 30 lbs; do not work on neck ROM.  PMH: cervical spondylosis with myelopathy; s/p cervical vertebrectomy C4/5 with C3-6 plating 2007 acromegaly s/p pituitary tumor resection (1994); hypogonadism; Wolff-Parkinson-White Syndrome; cardiomyopathy; acute blood loss anemia; adjustment disorder with depressed mood   Patient Stated Goals "I'd like to  be able to walk by myself with no assistance, stand up out of a chair by myself, be able to get up and down stairs without assistance.   Currently in Pain? Yes   Pain Score 3    Pain Location Scapula   Pain Orientation Right;Left   Pain Descriptors / Indicators Sore   Pain Type Acute pain   Pain Onset In the past 7 days   Pain Frequency Constant   Aggravating Factors  Performing arm exercises   Pain Relieving Factors rest   Multiple Pain Sites No                Vestibular Assessment - 01/01/15 0001    Positional Testing   Horizontal Canal Testing Horizontal Canal Right;Horizontal Canal Left;Horizontal Canal Right Intensity;Horizontal Canal Left Intensity   Horizontal Canal Right   Horizontal Canal Right Duration Pt reports concordant dizziness approx 3 seconds upon rolling to R side; no nystagmus noted.   Horizontal Canal Right Symptoms Normal   Horizontal Canal Left   Horizontal Canal Left Duration NA   Horizontal Canal Left Symptoms Normal   Horizontal Canal Right Intensity   Horizontal Canal Right Intensity Mild   Orthostatics   BP supine (x 5 minutes) 115/59 mmHg   HR supine (x 5 minutes) 62   BP standing (after 1 minute) 123/71 mmHg  lightheadedness < 10 seconds; overall fatigue ongoing   HR standing (after 1 minute) 46  BP standing (after 3 minutes) 125/82 mmHg   HR standing (after 3 minutes) 45  taken manually for full minute; regular rate and rhythm   Orthostatics Comment HR increased to 67 bpm and pt denied dizziness and reported less fatigue after pt performed the following exercises: seated LAQ x15 reps per side; sit <> partial stand x10 reps.                 [facility] Adult PT Treatment/Exercise - 01/01/15 0001    Transfers   Transfers Sit to Stand;Stand to Sit   Sit to Stand 5: Supervision   Sit to Stand Details (indicate cue type and reason) Cueing to wait >15 seconds to prevent LOB due to dizziness.   Stand to Sit 6: Modified independent  (Device/Increase time)   Ambulation/Gait   Ambulation/Gait Yes   Ambulation/Gait Assistance 5: Supervision;4: Min assist;4: Min guard   Ambulation/Gait Assistance Details (S) for gait with bariatric SPC x230'; min guard-min A for gait x300' without AD (x200' with 2 lb weight on L ankle for increased proprioceptive input).   Ambulation Distance (Feet) 630 Feet   Assistive device Straight cane;None   Gait Pattern Step-through pattern;Decreased hip/knee flexion - left;Decreased weight shift to left;Left flexed knee in stance;Lateral trunk lean to right;Poor foot clearance - left;Lateral hip instability  L Trendelenburg; L pelvic retraction   Ambulation Surface Level;Indoor                PT Education - 01/01/15 1949    Education provided Yes   Education Details Recommended pt/parents notify cardiologist of HR < 50 bpm and symptoms. Precautions to prevent falls due to dizziness with sit > stand. Provided information on ordering bariatric SPC, per request of pt/parents.   Person(s) Educated Patient;Parent(s)   Methods Explanation;Demonstration;Verbal cues   Comprehension Verbalized understanding;Returned demonstration          PT Short Term Goals - 12/08/14 1451    PT SHORT TERM GOAL #1   Title Pt will perform initial HEP with mod I using paper handout to indicate safe HEP compliance, maximize functional gains made in PT. Target date: 11/06/14   Baseline Met 9/22.   Status Achieved   PT SHORT TERM GOAL #2   Title Perform 6 Minute Walk Test to assess/address functional endurance. Target date: 11/06/14   Baseline 9/20: 556' with RW   Status Achieved   PT SHORT TERM GOAL #3   Title Perform TUG to assess/address efficiency of functional mobility. Target date: 11/06/14   Baseline 9/20: TUG time = 28.3 seconds with RW   Status Achieved   PT SHORT TERM GOAL #4   Title Pt will ambulate 200' over level, indoor surfaces with mod I using LRAD to indicate increased safety/indepedence  with household ambulation. Target date: 11/06/14   Baseline Met 10/12 with bariatric RW.   Status Achieved   PT SHORT TERM GOAL #5   Title Pt will negotiate 14 stairs with B rails and min A of single person to indicate increased independence accessing second floor of home. Target date: 11/06/14   Baseline 10/10: required min guard of PT for stairs x16   Status Achieved   PT SHORT TERM GOAL #6   Title Complete Berg Balance Scale and improve score by 4 points from baseline to progress toward significant improvement in functional standing balance. Target date: 01/02/15   Baseline 11/18: Berg score = 34/56   Status Achieved   PT SHORT TERM GOAL #7   Title Pt will  improve gait velocity from 1.94 ft/sec to 2.24 ft/sec to indicate increased efficiency of ambulation. Target date: 01/02/15   Baseline 11/15: 1.94 ft/sec using LBQC   Status New   PT SHORT TERM GOAL #8   Title Pt will ambulate 71' over level, indoor surfaces without AD requiring supervision to progress toward independence with household mobility. Target date: 01/02/15   Status New           PT Long Term Goals - 12/08/14 1452    PT LONG TERM GOAL #1   Title Pt will improve 6 Minute Walk distance by 150' from baseline to indicate significant improvement in functional endurance. Target date: 12/04/14   Baseline 9/20: baseline 6MWT distance = 556' with RW;  11/15: 6MWT distance = 910 ' with RW   Status Achieved   PT LONG TERM GOAL #2   Title Pt will improve TUG score by 10.8 seconds from baseline to indicate significant improvement in efficiency of functional mobility.  Target date: 12/04/14   Baseline 9/20: baseline TUG = 28.4 sec with RW;  11/15: TUG = 17.67 with RW   Status Achieved   PT LONG TERM GOAL #3   Title Pt will ambulate 500' over unlevel, paved surfaces with mod I using LRAD to indicate increased stability/independence with community mobility. Target date: 12/04/14   Baseline Met 11/10 with LBQC.   Status Achieved    PT LONG TERM GOAL #4   Title Pt will negotiate standard ramp and curb step with mod I using LRAD to indicate increased safety/indepedence traversing community obstacles. Target date: 12/04/14   Baseline Met 11/10 with LBQC.   Status Achieved   PT LONG TERM GOAL #5   Title Pt will negotiate 14 stairs with 2 rails and mod I to indicate increased independence accessing second floor of home.  Target date: 12/04/14   Baseline Met 11/27/14.   Status Achieved   PT LONG TERM GOAL #6   Title Pt will improve Berg score by 8 points from baseline to indicate significant improvement in functional standing balance. Target date: 01/30/15   Baseline 11/18: baseline Berg score = 34/56   Status New   PT LONG TERM GOAL #7   Title Pt will improve gait velocity to > 2.62 ft/sec to indicate status of community ambulatory.  Target date: 01/30/15   Status New   PT LONG TERM GOAL #8   Title Pt will perform TUG in < 13.5 seconds to indicate decreased fall risk. Target date: 01/30/15   Status New   PT LONG TERM GOAL  #9   TITLE Pt will ambulate 1' over level, indoor surfaces without AD with mod I to indicate increased stability/independence with household ambulation.  Target date: 01/30/15   Status New               Plan - 01/01/15 1959    Clinical Impression Statement Skilled session focused on gait training, education, and determine origin of dizziness. Ruled out BPPV as origin of dizziness. With testing of orthostatic vital signs, pt symptomatic and HR 47 bpm after sit > stand.  Recommended pt/parents notify cardoilogist of HR < 50 bpm. Proceeded with session when HR increased to 67 bpm and symptoms subsided after seated exercises.   Pt will benefit from skilled therapeutic intervention in order to improve on the following deficits Abnormal gait;Decreased activity tolerance;Decreased balance;Decreased endurance;Decreased coordination;Decreased mobility;Decreased strength;Impaired sensation;Pain   Rehab  Potential Good   PT Frequency 2x / week   PT Duration  8 weeks   PT Treatment/Interventions Vestibular;ADLs/Self Care Home Management;DME Instruction;Balance training;Therapeutic exercise;Therapeutic activities;Functional mobility training;Stair training;Gait training;Neuromuscular re-education;Patient/family education;Electrical Stimulation   PT Next Visit Plan Continue to work toward goals. Gait training with SPC; standing balance; hip strengthening.  * Last session before PT hiatus (will resume after beginning of year due to insurance benefit exhausted).   Consulted and Agree with Plan of Care Patient;Family member/caregiver   Family Member Consulted parents        Problem List Patient Active Problem List   Diagnosis Date Noted  . Adjustment disorder with depressed mood   . Tetraplegia ([place]) 09/19/2014  . Acute blood loss anemia 09/19/2014  . Cervical spondylosis with myelopathy 09/18/2014  . Radiculopathy of arm 08/28/2014  . Hypogonadism male 09/08/2013  . 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 Cardiomyopathy 04/01/2011  . Wolff-Parkinson-White (WPW) syndrome 04/01/2011   Billie Ruddy, PT, DPT [facility] [address] [place], [place], [address] Phone: [phone]   Fax:  [phone] 01/01/2015, 8:07 PM   Name: Mark Gallagher MRN: [id number] Date of Birth: 03-May-1970

## 2015-01-02 ENCOUNTER — Ambulatory Visit (INDEPENDENT_AMBULATORY_CARE_PROVIDER_SITE_OTHER): Payer: Managed Care, Other (non HMO) | Admitting: *Deleted

## 2015-01-02 DIAGNOSIS — E220 Acromegaly and pituitary gigantism: Secondary | ICD-10-CM | POA: Diagnosis not present

## 2015-01-02 DIAGNOSIS — E291 Testicular hypofunction: Secondary | ICD-10-CM

## 2015-01-02 MED ORDER — OCTREOTIDE ACETATE 30 MG IM KIT
30.0000 mg | PACK | Freq: Once | INTRAMUSCULAR | Status: AC
  Administered 2015-01-02: 30 mg via INTRAMUSCULAR

## 2015-01-02 NOTE — Progress Notes (Signed)
   Subjective:    Patient ID: Mark Gallagher, male    DOB: April 30, 1970, 44 y.o.   MRN: [id number]  HPI    Review of Systems     Objective:   Physical Exam        Assessment & Plan:  Patient here for testosterone cypionate 1 ml and Sandostatin LAR 30 injection only. Patient was advised to have a PSA level checked before next visit.  The dosage for testosterone was discussed with Dr Everlene Farrier before it was given, per Dr Everlene Farrier.

## 2015-01-04 ENCOUNTER — Ambulatory Visit: Payer: Managed Care, Other (non HMO) | Admitting: Occupational Therapy

## 2015-01-04 ENCOUNTER — Ambulatory Visit: Payer: Managed Care, Other (non HMO) | Admitting: Physical Therapy

## 2015-01-04 DIAGNOSIS — M6289 Other specified disorders of muscle: Secondary | ICD-10-CM | POA: Diagnosis not present

## 2015-01-04 DIAGNOSIS — R269 Unspecified abnormalities of gait and mobility: Secondary | ICD-10-CM

## 2015-01-04 DIAGNOSIS — R278 Other lack of coordination: Secondary | ICD-10-CM

## 2015-01-04 DIAGNOSIS — R29898 Other symptoms and signs involving the musculoskeletal system: Secondary | ICD-10-CM

## 2015-01-04 DIAGNOSIS — R2681 Unsteadiness on feet: Secondary | ICD-10-CM

## 2015-01-04 DIAGNOSIS — M6281 Muscle weakness (generalized): Secondary | ICD-10-CM

## 2015-01-04 DIAGNOSIS — R279 Unspecified lack of coordination: Secondary | ICD-10-CM

## 2015-01-04 DIAGNOSIS — R531 Weakness: Secondary | ICD-10-CM

## 2015-01-04 NOTE — Therapy (Signed)
[facility] [address] [place], [place], [address] Phone: [phone]   Fax:  [phone]  Occupational Therapy Treatment  Patient Details  Name: Mark Gallagher MRN: [id number] Date of Birth: [birth year] No Data Recorded  Encounter Date: 01/04/2015      OT End of Session - 01/04/15 1414    Visit Number 26   Number of Visits 33   Date for OT Re-Evaluation 01/29/15   Authorization Type Aetna, 60 visit limit combined   Authorization Time Period renewal completed 11/30/14, wk 5/8   Authorization - Visit Number 26   Authorization - Number of Visits 28  PT to use extra 2 visits   OT Start Time 1402   OT Stop Time 1445   OT Time Calculation (min) 43 min   Activity Tolerance Patient tolerated treatment well   Behavior During Therapy [facility] for tasks assessed/performed      Past Medical History  Diagnosis Date  . Thyroid disease   . Cancer ([place])   . 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 Testosterone deficiency   . Knee pain, bilateral   . History of pituitary tumor     Past Surgical History  Procedure Laterality Date  . Cervical disc surgery  2007    Anterior C4/5 vertebrectomy and c3-C6 laminoplasty with plating  . Craniotomy for tumor      pituitary adenoma    There were no vitals filed for this visit.  Visit Diagnosis:  Weakness generalized  Left hand weakness  Decreased coordination      Subjective Assessment - 01/04/15 1409    Subjective  Pt reports that work went well    Pertinent History C3-T2 fusion 09/10/14 with cardiac surgery after surgery, hx of previous C1-C6 fusion (2007)   Limitations cleared for overhead reaching; lifting restriction of 30lbs, cleared for shoulder strengthening, **Do not work on neck motion   Patient Stated Goals return to work   Currently in Pain? Yes   Pain Score 3    Pain Location Neck   Pain Descriptors / Indicators Sore   Pain Type Surgical pain   Aggravating Factors  work  (looking down at the computer screen)   Pain Relieving Factors rest        Treatment:  -In supine:  Cane exercises for shoulder flex and abduction for end range stretch; shoulder flex with 2lb for end-range weighted stretch, circumduction both directions with 2lb.    -In sitting, cane exercises for ER stretch, shoulder flex/abduction to mid range with 1lb each UE x10 with min cueing for compensation, functional reaching to retrieve/replace clothespins with 1-8lb resistance for high range reaching.  -In standing, ER/shoulder extension with cane with min v.c. For posture  -Picking up blocks using 75lbs sustained grip strength with min difficulty.   -Encouraged pt to begin performing home maintenance and simple meal prep (laundry--carrying small loads, loading/unloading dishwasher, simple cooking) to incr activity tolerance and strength.  Pt verbalized understanding.                     OT Short Term Goals - 12/27/14 1557    OT SHORT TERM GOAL #1   Title Pt will verbalize understanding of coordination/hand strength HEP--check STGs 11/08/14   Time 4   Period Weeks   Status Achieved   OT SHORT TERM GOAL #2   Title Pt will perfom BADLs mod I.   Baseline min A bathing   Time 4   Period Weeks  Status Achieved  11/06/14  supervision for shower transfer, min A for R brace/neck brace;  11/27/14 met   OT SHORT TERM GOAL #3   Title Pt will improve L hand coordination for ADLs as shown by completing 9-hole peg test in less than 55sec.   Baseline 77.34sec   Time 4   Period Weeks   Status Achieved  11/06/14:  56.94sec;  11/27/14  45.69sec   OT SHORT TERM GOAL #4   Title Pt will demo at least 40lbs L grip strength for ADLs/opening containers.   Baseline 30lbs   Time 4   Period Weeks   Status Achieved  11/06/14:  45lbs   OT SHORT TERM GOAL #5   Title Pt will improve L hand coordination for ADLs as shown by completing 9-hole peg test in less than 40sec.--check 12/29/14    Baseline 45.56 secs, 34.12 secs   Time 4   Period Weeks   Status Partially Met   OT SHORT TERM GOAL #6   Title Pt will be able to perform overhead reaching with LUE for at least 46mn without rest break for incr activity tolerance.--check 12/29/14   Time 4   Period Weeks   Status Achieved   OT SHORT TERM GOAL #7   Title Pt will demo at least 55lbs L grip strength for ADLs/opening containers.--check 12/29/14   Time 4   Period Weeks   Status On-going  12/25/14:  49lbs           OT Long Term Goals - 01/04/15 1426    OT LONG TERM GOAL #1   Title Pt will be independent with proximal UE HEP when cleared from cervical/lifting precautions.--check updated LTGs 01/29/15   Status On-going  11/27/14 pt not yet cleared from cervical precautions   OT LONG TERM GOAL #2   Title Pt will perform simple meal prep/home maintenance tasks mod I.   Status On-going  not fully met 11/30/14   OT LONG TERM GOAL #3   Title Pt will improve L hand coordination for ADLs as shown by completing 9-hole peg test in less than 37sec.   Baseline 47.88secs  11/21/14; 11/27/14  45.69sec  (revised goal to <37 instead of less than 45sec for renewal)   Status Revised   OT LONG TERM GOAL #4   Title Pt will demo at least 60lbs L grip strength for ADLs/opening containers.   Baseline revised to 60 for renewal 11/30/14   Status Revised  11/27/14:  49lbs; 12/25/14  49lbs   OT LONG TERM GOAL #5   Title Pt will be able to retrieve 4lb object from overhead shelf with LUE.   Time 8   Period Weeks   Status New               Plan - 01/04/15 1442    Clinical Impression Statement Pt continues to progress towards goals.  Less compensation noted with LUE shoulder movement.     Plan continue to address LUE stength and shoulder AROM   OT Home Exercise Plan Education issued: coordination 10/12/14, cane HEP and yellow band (bicep/triceps); 12/25/14 updated HEP    Consulted and Agree with Plan of Care Patient;Family member/caregiver    Family Member Consulted parents        Problem List Patient Active Problem List   Diagnosis Date Noted  . Adjustment disorder with depressed mood   . [place]raplegia ([place] 09/19/2014  . Acute blood loss anemia 09/19/2014  . Cervical spondylosis with myelopathy 09/18/2014  .  Radiculopathy of arm 08/28/2014  . Hypogonadism male 09/08/2013  . 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 Cardiomyopathy 04/01/2011  . Wolff-Parkinson-White (WPW) syndrome 04/01/2011    [facility] 01/04/2015, 5:08 PM  [facility] [address] [place], [place], [address] Phone: [phone]   Fax:  [phone]  Name: Mark Gallagher MRN: [id number] Date of Birth: 1970-10-20  Vianne Bulls, OTR/L 01/04/2015 5:08 PM

## 2015-01-04 NOTE — Therapy (Signed)
[facility] [address] [place], [place], [address] Phone: [phone]   Fax:  [phone]  Physical Therapy Treatment  Patient Details  Name: Mark Gallagher MRN: [id number] Date of Birth: Oct 08, 1970 No Data Recorded  Encounter Date: 01/04/2015      PT End of Session - 01/04/15 1621    Visit Number 30   Number of Visits 37   Date for PT Re-Evaluation 02/03/15   Authorization Type Aet[employer]    Authorization - Visit Number 30   Authorization - Number of Visits 30   PT Start Time 0932   PT Stop Time 1545   PT Time Calculation (min) 51 min   Equipment Utilized During Treatment Gait belt   Activity Tolerance Patient tolerated treatment well   Behavior During Therapy [facility] for tasks assessed/performed      Past Medical History  Diagnosis Date  . Thyroid disease   . Cancer ([place])   . 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 Testosterone deficiency   . Knee pain, bilateral   . History of pituitary tumor     Past Surgical History  Procedure Laterality Date  . Cervical disc surgery  2007    Anterior C4/5 vertebrectomy and c3-C6 laminoplasty with plating  . Craniotomy for tumor      pituitary adenoma    There were no vitals filed for this visit.  Visit Diagnosis:  Abnormality of gait  Unsteadiness  Muscle weakness (generalized)      Subjective Assessment - 01/04/15 1455    Subjective Pt returned to work on Monday. Has been working 7 pm - 11 pm every day since Monday. Pt reports no further dizziness.    Patient is accompained by: Family member  mother   Pertinent History * Restrictions: no lifting > 30 lbs; do not work on neck ROM.  PMH: cervical spondylosis with myelopathy; s/p cervical vertebrectomy C4/5 with C3-6 plating 2007 acromegaly s/p pituitary tumor resection (1994); hypogonadism; Wolff-Parkinson-White Syndrome; cardiomyopathy; acute blood loss anemia; adjustment disorder with depressed mood   Currently in Pain? Yes    Pain Score 3    Pain Location Scapula   Pain Orientation Right;Left   Pain Descriptors / Indicators Sore;Aching   Pain Type Surgical pain   Pain Onset 1 to 4 weeks ago   Pain Frequency Constant   Aggravating Factors  work (looking down at computer screen)   Pain Relieving Factors rest; ;when not working   Effect of Pain on Daily Activities PT will monitor but will not directly address pain  due to nature of pain.   Multiple Pain Sites No                         [facility] Adult PT Treatment/Exercise - 01/04/15 0001    Ambulation/Gait   Ambulation/Gait Yes   Ambulation/Gait Assistance 5: Supervision;6: Modified independent (Device/Increase time)   Ambulation/Gait Assistance Details x210 with LBQC, L AFO, L hinged knee brace, and mod I; x115' without AD with supervision, no LOB; and x420 without AD and without L hinged knee brace using NMES at L hamstrings for L stance control. Noted decreased L knee control during turning (around track) due to increased L hip internal rotation, increased L hip retraction  Therefore, provided cueing for increased L hip external rotation.   Ambulation Distance (Feet) 860 Feet   Assistive device None;Other (Comment);Large base quad cane  L AFO   Gait Pattern Step-through pattern;Decreased hip/knee flexion - left;Decreased weight  shift to left;Left flexed knee in stance;Lateral trunk lean to right;Poor foot clearance - left;Lateral hip instability  L Trendelenburg; L pelvic retraction   Ambulation Surface Level;Indoor   Gait velocity 2.54 ft/sec                PT Education - 01/04/15 1546    Education provided Yes   Education Details STG's, findings, and progress.   Person(s) Educated Patient;Parent(s)   Methods Explanation   Comprehension Verbalized understanding          PT Short Term Goals - 01/04/15 1507    PT SHORT TERM GOAL #1   Title Pt will perform initial HEP with mod I using paper handout to indicate safe HEP  compliance, maximize functional gains made in PT. Target date: 11/06/14   Baseline Met 9/22.   Status Achieved   PT SHORT TERM GOAL #2   Title Perform 6 Minute Walk Test to assess/address functional endurance. Target date: 11/06/14   Baseline 9/20: 556' with RW   Status Achieved   PT SHORT TERM GOAL #3   Title Perform TUG to assess/address efficiency of functional mobility. Target date: 11/06/14   Baseline 9/20: TUG time = 28.3 seconds with RW   Status Achieved   PT SHORT TERM GOAL #4   Title Pt will ambulate 200' over level, indoor surfaces with mod I using LRAD to indicate increased safety/indepedence with household ambulation. Target date: 11/06/14   Baseline Met 10/12 with bariatric RW.   Status Achieved   PT SHORT TERM GOAL #5   Title Pt will negotiate 14 stairs with B rails and min A of single person to indicate increased independence accessing second floor of home. Target date: 11/06/14   Baseline 10/10: required min guard of PT for stairs x16   Status Achieved   PT SHORT TERM GOAL #6   Title Complete Berg Balance Scale and improve score by 4 points from baseline to progress toward significant improvement in functional standing balance. Target date: 01/02/15   Baseline 11/18: Berg score = 34/56   Status Achieved   PT SHORT TERM GOAL #7   Title Pt will improve gait velocity from 1.94 ft/sec to 2.24 ft/sec to indicate increased efficiency of ambulation. Target date: 01/02/15   Baseline 11/15: 1.94 ft/sec using LBQC      12/15: 2.54 ft/sec with [facility]   Status Achieved   PT SHORT TERM GOAL #8   Title Pt will ambulate 9' over level, indoor surfaces without AD requiring supervision to progress toward independence with household mobility. Target date: 01/02/15   Baseline 12/15: ambulated x115' without AD with supervision   Status Achieved           PT Long Term Goals - 12/08/14 1452    PT LONG TERM GOAL #1   Title Pt will improve 6 Minute Walk distance by 150' from baseline to  indicate significant improvement in functional endurance. Target date: 12/04/14   Baseline 9/20: baseline 6MWT distance = 556' with RW;  11/15: 6MWT distance = 910 ' with RW   Status Achieved   PT LONG TERM GOAL #2   Title Pt will improve TUG score by 10.8 seconds from baseline to indicate significant improvement in efficiency of functional mobility.  Target date: 12/04/14   Baseline 9/20: baseline TUG = 28.4 sec with RW;  11/15: TUG = 17.67 with RW   Status Achieved   PT LONG TERM GOAL #3   Title Pt will ambulate 500' over unlevel, paved  surfaces with mod I using LRAD to indicate increased stability/independence with community mobility. Target date: 12/04/14   Baseline Met 11/10 with LBQC.   Status Achieved   PT LONG TERM GOAL #4   Title Pt will negotiate standard ramp and curb step with mod I using LRAD to indicate increased safety/indepedence traversing community obstacles. Target date: 12/04/14   Baseline Met 11/10 with LBQC.   Status Achieved   PT LONG TERM GOAL #5   Title Pt will negotiate 14 stairs with 2 rails and mod I to indicate increased independence accessing second floor of home.  Target date: 12/04/14   Baseline Met 11/27/14.   Status Achieved   PT LONG TERM GOAL #6   Title Pt will improve Berg score by 8 points from baseline to indicate significant improvement in functional standing balance. Target date: 01/30/15   Baseline 11/18: baseline Berg score = 34/56   Status New   PT LONG TERM GOAL #7   Title Pt will improve gait velocity to > 2.62 ft/sec to indicate status of community ambulatory.  Target date: 01/30/15   Status New   PT LONG TERM GOAL #8   Title Pt will perform TUG in < 13.5 seconds to indicate decreased fall risk. Target date: 01/30/15   Status New   PT LONG TERM GOAL  #9   TITLE Pt will ambulate 55' over level, indoor surfaces without AD with mod I to indicate increased stability/independence with household ambulation.  Target date: 01/30/15   Status New                Plan - 01/04/15 1623    Clinical Impression Statement Session focused on assessing STG's and gait training with emphasis on LLE stance control. Pt met 2 of 2 STG's for gait velocity and for ambulation without AD > 75' with supervision, no overt LOB. Pt is making steady progress toward LTG's and will continue to benefit from skilled outpatient PT to maximize safety with functiional mobility and progress toward PLOF.   Pt will benefit from skilled therapeutic intervention in order to improve on the following deficits Abnormal gait;Decreased activity tolerance;Decreased balance;Decreased endurance;Decreased coordination;Decreased mobility;Decreased strength;Impaired sensation;Pain   Rehab Potential Good   PT Frequency 2x / week   PT Duration 8 weeks   PT Treatment/Interventions Vestibular;ADLs/Self Care Home Management;DME Instruction;Balance training;Therapeutic exercise;Therapeutic activities;Functional mobility training;Stair training;Gait training;Neuromuscular re-education;Patient/family education;Electrical Stimulation   PT Next Visit Plan * Add gluteus maximus strengthening  (LLE single leg bridging?) to HEP. Address sit > stand from low couch (pt/mother to measure height from floor to seat). Continue gait training without AD.   Consulted and Agree with Plan of Care Patient;Family member/caregiver   Family Member Consulted mother        Problem List Patient Active Problem List   Diagnosis Date Noted  . Adjustment disorder with depressed mood   . Tetraplegia ([place]) 09/19/2014  . Acute blood loss anemia 09/19/2014  . Cervical spondylosis with myelopathy 09/18/2014  . Radiculopathy of arm 08/28/2014  . Hypogonadism male 09/08/2013  . 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 Cardiomyopathy 04/01/2011  . Wolff-Parkinson-White (WPW) syndrome 04/01/2011   Billie Ruddy, PT, DPT [facility] [address] [place], [place], [address] Phone:  [phone]   Fax:  [phone] 01/04/2015, 4:27 PM  Name: EINAR NOLASCO MRN: [id number] Date of Birth: [birth year]

## 2015-01-08 ENCOUNTER — Ambulatory Visit: Payer: Managed Care, Other (non HMO) | Admitting: Occupational Therapy

## 2015-01-08 ENCOUNTER — Ambulatory Visit: Payer: Managed Care, Other (non HMO) | Admitting: Physical Therapy

## 2015-01-08 ENCOUNTER — Encounter: Payer: Self-pay | Admitting: Physical Therapy

## 2015-01-08 DIAGNOSIS — M6289 Other specified disorders of muscle: Secondary | ICD-10-CM | POA: Diagnosis not present

## 2015-01-08 DIAGNOSIS — R278 Other lack of coordination: Secondary | ICD-10-CM

## 2015-01-08 DIAGNOSIS — R279 Unspecified lack of coordination: Secondary | ICD-10-CM

## 2015-01-08 DIAGNOSIS — R2681 Unsteadiness on feet: Secondary | ICD-10-CM

## 2015-01-08 DIAGNOSIS — R29898 Other symptoms and signs involving the musculoskeletal system: Secondary | ICD-10-CM

## 2015-01-08 DIAGNOSIS — M6281 Muscle weakness (generalized): Secondary | ICD-10-CM

## 2015-01-08 DIAGNOSIS — R531 Weakness: Secondary | ICD-10-CM

## 2015-01-08 NOTE — Therapy (Signed)
[facility] [address] [place], [place], [address] Phone: [phone]   Fax:  [phone]  Occupational Therapy Treatment  Patient Details  Name: Mark Gallagher MRN: [id number] Date of Birth: Feb 27, 1970 No Data Recorded  Encounter Date: 01/08/2015      OT End of Session - 01/08/15 1408    Visit Number 27   Number of Visits 33   Date for OT Re-Evaluation 01/29/15   Authorization Type Aetna, 60 visit limit combined   Authorization Time Period renewal completed 11/30/14, wk 6/8   Authorization - Visit Number 27   Authorization - Number of Visits 28  PT to use extra 2 visits   OT Start Time 1403   OT Stop Time 1445   OT Time Calculation (min) 42 min   Activity Tolerance Patient tolerated treatment well   Behavior During Therapy [facility] for tasks assessed/performed      Past Medical History  Diagnosis Date  . Thyroid disease   . Cancer ([place])   . 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 Testosterone deficiency   . Knee pain, bilateral   . History of pituitary tumor     Past Surgical History  Procedure Laterality Date  . Cervical disc surgery  2007    Anterior C4/5 vertebrectomy and c3-C6 laminoplasty with plating  . Craniotomy for tumor      pituitary adenoma    There were no vitals filed for this visit.  Visit Diagnosis:  Weakness generalized  Left hand weakness  Decreased coordination  Unsteadiness      Subjective Assessment - 01/08/15 1443    Subjective  Pt reports that work is still going well.  Pt reports that band HEP is still challenging.   Patient is accompained by: Family member   Pertinent History C3-T2 fusion 09/10/14 with cardiac surgery after surgery, hx of previous C1-C6 fusion (2007)   Limitations cleared for overhead reaching; lifting restriction of 30lbs, cleared for shoulder strengthening, **Do not work on neck motion   Patient Stated Goals return to work   Currently in Pain? Yes   Pain Score 4     Pain Location Neck   Pain Descriptors / Indicators Aching;Sore   Pain Type Surgical pain   Aggravating Factors  shoulder exercises, looking down at computer   Pain Relieving Factors rest             [facility] OT Assessment - 01/08/15 0001    AROM   AROM Assessment Site Shoulder   Right/Left Shoulder Right;Left   Right Shoulder Flexion 140 Degrees   Left Shoulder Flexion 130 Degrees   Hand Function   Left Hand Grip (lbs) 60     Assessed ROM and grip strength and discussed progress.             OT Treatments/Exercises (OP) - 01/08/15 0001     Exercises   Shoulder Flexion Self ROM;Supine;Left  with cane   Shoulder ABduction Self ROM;AAROM;Left;Supine  with cane   Shoulder External Rotation AAROM;Self ROM;Left;Seated  with cane, min v.c.   Other Exercises 1 AAROM shoulder flex along wall with ball.   Other Exercises 2 Reviewed theraband ex. for shoulder flex and horizontal abduction with min cueing.   Other Weight-Bearing Exercises 1 Wt. bearing in modified quadraped with alternating UE lifts for incr core/UE stability and strength (lifts in forward flex and abduction).   Other Weight-Bearing Exercises 2 wall push-ups for incr core/scapular strength.   Reciprocal Movements Arm bike x 51mn level  7 for conditioning without rest.                  OT Short Term Goals - 01/08/15 1422    OT SHORT TERM GOAL #1   Title Pt will verbalize understanding of coordination/hand strength HEP--check STGs 11/08/14   Time 4   Period Weeks   Status Achieved   OT SHORT TERM GOAL #2   Title Pt will perfom BADLs mod I.   Baseline min A bathing   Time 4   Period Weeks   Status Achieved  11/06/14  supervision for shower transfer, min A for R brace/neck brace;  11/27/14 met   OT SHORT TERM GOAL #3   Title Pt will improve L hand coordination for ADLs as shown by completing 9-hole peg test in less than 55sec.   Baseline 77.34sec   Time 4   Period Weeks   Status Achieved   11/06/14:  56.94sec;  11/27/14  45.69sec   OT SHORT TERM GOAL #4   Title Pt will demo at least 40lbs L grip strength for ADLs/opening containers.   Baseline 30lbs   Time 4   Period Weeks   Status Achieved  11/06/14:  45lbs   OT SHORT TERM GOAL #5   Title Pt will improve L hand coordination for ADLs as shown by completing 9-hole peg test in less than 40sec.--check 12/29/14   Baseline 45.56 secs, 34.12 secs   Time 4   Period Weeks   Status Partially Met   OT SHORT TERM GOAL #6   Title Pt will be able to perform overhead reaching with LUE for at least 2mn without rest break for incr activity tolerance.--check 12/29/14   Time 4   Period Weeks   Status Achieved   OT SHORT TERM GOAL #7   Title Pt will demo at least 55lbs L grip strength for ADLs/opening containers.--check 12/29/14   Time 4   Period Weeks   Status Achieved  12/25/14:  49lbs; 01/08/15  60lbs           OT Long Term Goals - 01/08/15 1423    OT LONG TERM GOAL #1   Title Pt will be independent with proximal UE HEP when cleared from cervical/lifting precautions.--check updated LTGs 01/29/15   Status On-going  11/27/14 pt not yet cleared from cervical precautions   OT LONG TERM GOAL #2   Title Pt will perform simple meal prep/home maintenance tasks mod I.   Status On-going  not fully met 11/30/14   OT LONG TERM GOAL #3   Title Pt will improve L hand coordination for ADLs as shown by completing 9-hole peg test in less than 37sec.   Baseline 47.88secs  11/21/14; 11/27/14  45.69sec  (revised goal to <37 instead of less than 45sec for renewal)   Status Revised   OT LONG TERM GOAL #4   Title Pt will demo at least 65lbs L grip strength for ADLs/opening containers.   Baseline revised to 60 for renewal 11/30/14; upgraded to 65lbs 01/08/15 due to goal met   Status Achieved  11/27/14:  49lbs; 12/25/14  49lbs; 01/08/15  met 60lbs   OT LONG TERM GOAL #5   Title Pt will be able to retrieve 2lb object from overhead shelf with LUE.    Time 8   Period Weeks   Status Revised  01/08/15:  unable to perform with 1lb without compensation (revised goal from 4lbs to 2lbs)   Long Term Additional Goals   Additional Long  Term Goals Yes   OT LONG TERM GOAL #6   Title added 01/08/15:  Pt will demo at least 140* L shoulder flexion for functional reaching.   Status New               Plan - 01/08/15 1440    Clinical Impression Statement Pt continues to progress towards goals.  L grip strength improved now, but continues to demo L shoulder weakness.  Added LTG#6 for incr L shoulder ROM, upgraded LTG#4, and revised LTG#5.   Plan continue with LUE strength and ROM; after next session, hold until January due to insurance visit limits   OT Home Exercise Plan Education issued: coordination 10/12/14, cane HEP and yellow band (bicep/triceps); 12/25/14 updated HEP    Consulted and Agree with Plan of Care Patient;Family member/caregiver   Family Member Consulted parents        Problem List Patient Active Problem List   Diagnosis Date Noted  . Adjustment disorder with depressed mood   . Tetraplegia ([place]) 09/19/2014  . Acute blood loss anemia 09/19/2014  . Cervical spondylosis with myelopathy 09/18/2014  . Radiculopathy of arm 08/28/2014  . Hypogonadism male 09/08/2013  . 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 Cardiomyopathy 04/01/2011  . Wolff-Parkinson-White (WPW) syndrome 04/01/2011    [facility] 01/08/2015, 5:46 PM  [facility] [address] [place], [place], [address] Phone: [phone]   Fax:  [phone]  Name: CIARAN BEGAY MRN: [id number] Date of Birth: 06-08-1970  Vianne Bulls, OTR/L 01/08/2015 5:46 PM

## 2015-01-08 NOTE — Patient Instructions (Signed)
Bridging (Single Leg)    Lie on back with feet shoulder width apart and right leg straight on bed/floor. Lift hips toward the ceiling while keeping leg straight. Hold _5__ seconds. Repeat __10__ times. Do _1-2_ sessions per day.  http://gt2.exer.us/358   Copyright  VHI. All rights reserved.  Bent Knee Lift (Prone)    Abdomen and head supported, bend left knee and slowly raise hip. Avoid arching low back. Have assistance to maintain knee flexion as needed. Repeat with right knee bent and raising right hip. Repeat _10_ times per set. Do __2__ sets per session. Do _1-2_ sessions per day.  http://orth.exer.us/1110   Copyright  VHI. All rights reserved.

## 2015-01-10 NOTE — Therapy (Signed)
[facility] [address] [place], [place], [address] Phone: [phone]   Fax:  [phone]  Physical Therapy Treatment  Patient Details  Name: Mark Gallagher MRN: [id number] Date of Birth: [birth year] No Data Recorded  Encounter Date: 01/08/2015   01/08/15 1324  PT Visits / Re-Eval  Visit Number 31  Number of Visits 37  Date for PT Re-Evaluation 02/03/15  Authorization  Authorization Type Aetna   Authorization - Visit Number 31  Authorization - Number of Visits 30  PT Time Calculation  PT Start Time 1318  PT Stop Time 1400  PT Time Calculation (min) 42 min  PT - End of Session  Equipment Utilized During Treatment Gait belt  Activity Tolerance Patient tolerated treatment well  Behavior During Therapy WFL for tasks assessed/performed     Past Medical History  Diagnosis Date  . Thyroid disease   . Cancer ([place])   . 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 Testosterone deficiency   . Knee pain, bilateral   . History of pituitary tumor     Past Surgical History  Procedure Laterality Date  . Cervical disc surgery  2007    Anterior C4/5 vertebrectomy and c3-C6 laminoplasty with plating  . Craniotomy for tumor      pituitary adenoma    There were no vitals filed for this visit.  Visit Diagnosis:  Weakness generalized  Decreased coordination  Unsteadiness  Muscle weakness (generalized)     01/08/15 1322  Symptoms/Limitations  Subjective No falls or new complaints. No more dizziness.   Pain Assessment  Currently in Pain? Yes  Pain Score 4  Pain Location Scapula  Pain Orientation Right;Left  Pain Descriptors / Indicators Aching;Sore  Pain Type Surgical pain  Pain Onset 1 to 4 weeks ago  Pain Frequency Constant  Aggravating Factors  OT exercises, work (looking down at computer screen)  Pain Relieving Factors rest; being off work   Treatment: Exercises Hook lying: single leg bridging x 10 reps each side, 2  sets, with cues on form and technique. Prone: glut kick backs x 10 each side, 2 sets. Assist needed to maintain knee flexion for ex form and cues on technique.  Ther-activity blocked practice of sit<>stands form simulated sofa (~16 inches in height), blue foam on wood box. Cues on foot placement (to increase base of support and to pull feet farther back), on anterior weight shifting, to scoot to edge and to use arms on arm rest and flat sofa surface. Pt with increase ease with standing from side that has right hand on armrest vs the left hand. Did improve from both sides with repeated practice.        01/08/15 1400  PT Education  Education provided Yes  Education Details HEP: single leg bridging, prone glue kick backs  Person(s) Educated Patient;Parent(s)  Methods Explanation;Verbal cues;Demonstration;Handout  Comprehension Verbalized understanding;Returned demonstration;Need further instruction;Tactile cues required;Verbal cues required         PT Short Term Goals - 01/04/15 1507    PT SHORT TERM GOAL #1   Title Pt will perform initial HEP with mod I using paper handout to indicate safe HEP compliance, maximize functional gains made in PT. Target date: 11/06/14   Baseline Met 9/22.   Status Achieved   PT SHORT TERM GOAL #2   Title Perform 6 Minute Walk Test to assess/address functional endurance. Target date: 11/06/14   Baseline 9/20: 50' with RW   Status Achieved   PT SHORT TERM GOAL #3  Title Perform TUG to assess/address efficiency of functional mobility. Target date: 11/06/14   Baseline 9/20: TUG time = 28.3 seconds with RW   Status Achieved   PT SHORT TERM GOAL #4   Title Pt will ambulate 200' over level, indoor surfaces with mod I using LRAD to indicate increased safety/indepedence with household ambulation. Target date: 11/06/14   Baseline Met 10/12 with bariatric RW.   Status Achieved   PT SHORT TERM GOAL #5   Title Pt will negotiate 14 stairs with B rails and min A  of single person to indicate increased independence accessing second floor of home. Target date: 11/06/14   Baseline 10/10: required min guard of PT for stairs x16   Status Achieved   PT SHORT TERM GOAL #6   Title Complete Berg Balance Scale and improve score by 4 points from baseline to progress toward significant improvement in functional standing balance. Target date: 01/02/15   Baseline 11/18: Berg score = 34/56   Status Achieved   PT SHORT TERM GOAL #7   Title Pt will improve gait velocity from 1.94 ft/sec to 2.24 ft/sec to indicate increased efficiency of ambulation. Target date: 01/02/15   Baseline 11/15: 1.94 ft/sec using [facility]      12/15: 2.54 ft/sec with [facility]   Status Achieved   PT SHORT TERM GOAL #8   Title Pt will ambulate 59' over level, indoor surfaces without AD requiring supervision to progress toward independence with household mobility. Target date: 01/02/15   Baseline 12/15: ambulated x115' without AD with supervision   Status Achieved           PT Long Term Goals - 12/08/14 1452    PT LONG TERM GOAL #1   Title Pt will improve 6 Minute Walk distance by 150' from baseline to indicate significant improvement in functional endurance. Target date: 12/04/14   Baseline 9/20: baseline 6MWT distance = 556' with RW;  11/15: 6MWT distance = 910 ' with RW   Status Achieved   PT LONG TERM GOAL #2   Title Pt will improve TUG score by 10.8 seconds from baseline to indicate significant improvement in efficiency of functional mobility.  Target date: 12/04/14   Baseline 9/20: baseline TUG = 28.4 sec with RW;  11/15: TUG = 17.67 with RW   Status Achieved   PT LONG TERM GOAL #3   Title Pt will ambulate 500' over unlevel, paved surfaces with mod I using LRAD to indicate increased stability/independence with community mobility. Target date: 12/04/14   Baseline Met 11/10 with [facility].   Status Achieved   PT LONG TERM GOAL #4   Title Pt will negotiate standard ramp and curb step with mod I  using LRAD to indicate increased safety/indepedence traversing community obstacles. Target date: 12/04/14   Baseline Met 11/10 with [facility].   Status Achieved   PT LONG TERM GOAL #5   Title Pt will negotiate 14 stairs with 2 rails and mod I to indicate increased independence accessing second floor of home.  Target date: 12/04/14   Baseline Met 11/27/14.   Status Achieved   PT LONG TERM GOAL #6   Title Pt will improve Berg score by 8 points from baseline to indicate significant improvement in functional standing balance. Target date: 01/30/15   Baseline 11/18: baseline Berg score = 34/56   Status New   PT LONG TERM GOAL #7   Title Pt will improve gait velocity to > 2.62 ft/sec to indicate status of community ambulatory.  Target  date: 01/30/15   Status New   PT LONG TERM GOAL #8   Title Pt will perform TUG in < 13.5 seconds to indicate decreased fall risk. Target date: 01/30/15   Status New   PT LONG TERM GOAL  #9   TITLE Pt will ambulate 64' over level, indoor surfaces without AD with mod I to indicate increased stability/independence with household ambulation.  Target date: 01/30/15   Status New        01/08/15 1324  Plan  Clinical Impression Statement Educated pt and parents on exercises to address glut med weakness, added these to pt's HEP. Remainder of skilled session focused on transfers to/from lower, soft surfaces (to simulate pt's sofa at home). Pt progressed to supervision with UE support on "armrest" with blocked practice during session. Pt is making steady progress toward goals.                                            Pt will benefit from skilled therapeutic intervention in order to improve on the following deficits Abnormal gait;Decreased activity tolerance;Decreased balance;Decreased endurance;Decreased coordination;Decreased mobility;Decreased strength;Impaired sensation;Pain  Rehab Potential Good  PT Frequency 2x / week  PT Duration 8 weeks  PT Treatment/Interventions  Vestibular;ADLs/Self Care Home Management;DME Instruction;Balance training;Therapeutic exercise;Therapeutic activities;Functional mobility training;Stair training;Gait training;Neuromuscular re-education;Patient/family education;Electrical Stimulation  PT Next Visit Plan Continue gait training without AD, strengthening of LE's and balance activities.  PT Home Exercise Plan added exercises for glut med weakness (single leg bridging and prone glut kick backs)  Consulted and Agree with Plan of Care Patient;Family member/caregiver  Family Member Consulted mother     Problem List Patient Active Problem List   Diagnosis Date Noted  . Adjustment disorder with depressed mood   . Tetraplegia ([place]) 09/19/2014  . Acute blood loss anemia 09/19/2014  . Cervical spondylosis with myelopathy 09/18/2014  . Radiculopathy of arm 08/28/2014  . Hypogonadism male 09/08/2013  . 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 Cardiomyopathy 04/01/2011  . Wolff-Parkinson-White (WPW) syndrome 04/01/2011    Willow Ora 01/10/2015, 10:35 AM  Willow Ora, PTA, [facility] [address] [place], [place] [address] [phone] 01/10/2015, 10:40 AM   Name: Mark Gallagher MRN: [id number] Date of Birth: 27-Nov-1970

## 2015-01-11 ENCOUNTER — Ambulatory Visit: Payer: Managed Care, Other (non HMO) | Admitting: *Deleted

## 2015-01-11 ENCOUNTER — Ambulatory Visit: Payer: Managed Care, Other (non HMO) | Admitting: Physical Therapy

## 2015-01-11 DIAGNOSIS — R278 Other lack of coordination: Secondary | ICD-10-CM

## 2015-01-11 DIAGNOSIS — R279 Unspecified lack of coordination: Secondary | ICD-10-CM

## 2015-01-11 DIAGNOSIS — R2681 Unsteadiness on feet: Secondary | ICD-10-CM

## 2015-01-11 DIAGNOSIS — M6281 Muscle weakness (generalized): Secondary | ICD-10-CM

## 2015-01-11 DIAGNOSIS — M6289 Other specified disorders of muscle: Secondary | ICD-10-CM | POA: Diagnosis not present

## 2015-01-11 DIAGNOSIS — R531 Weakness: Secondary | ICD-10-CM

## 2015-01-11 DIAGNOSIS — R269 Unspecified abnormalities of gait and mobility: Secondary | ICD-10-CM

## 2015-01-11 DIAGNOSIS — G8250 Quadriplegia, unspecified: Secondary | ICD-10-CM

## 2015-01-11 NOTE — Therapy (Signed)
[facility] [address] [place], [place], [address] Phone: [phone]   Fax:  [phone]  Physical Therapy Treatment  Patient Details  Name: Mark Gallagher MRN: [id number] Date of Birth: Jan 05, 1971 No Data Recorded  Encounter Date: 01/11/2015      PT End of Session - 01/11/15 1307    Visit Number 32   Number of Visits 37   Date for PT Re-Evaluation 02/03/15   Authorization Type Aetna    Authorization - Visit Number 68   Authorization - Number of Visits 30  Annual therapy visit limit = 60 total   PT Start Time 1100   PT Stop Time 1145   PT Time Calculation (min) 45 min   Equipment Utilized During Treatment Gait belt   Activity Tolerance Patient tolerated treatment well   Behavior During Therapy WFL for tasks assessed/performed      Past Medical History  Diagnosis Date  . Thyroid disease   . Cancer ([place])   . 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 Testosterone deficiency   . Knee pain, bilateral   . History of pituitary tumor     Past Surgical History  Procedure Laterality Date  . Cervical disc surgery  2007    Anterior C4/5 vertebrectomy and c3-C6 laminoplasty with plating  . Craniotomy for tumor      pituitary adenoma    There were no vitals filed for this visit.  Visit Diagnosis:  Abnormality of gait  Weakness generalized  Unsteadiness      Subjective Assessment - 01/11/15 1107    Subjective Pt reports increased pain between blades today. States, "It's one of those days when I don't feel like coming in, but I did any way." Pt very happy that single point cane arrived today. Throughout session, pt requesting PT advice on how to maintain balance when walking over anti-fatigue mats at work, negotiating inclined/banked paved surface to use primary work entrance.   Patient is accompained by: Family member  mother   Pertinent History * Restrictions: no lifting > 30 lbs; do not work on neck ROM.  PMH: cervical  spondylosis with myelopathy; s/p cervical vertebrectomy C4/5 with C3-6 plating 2007 acromegaly s/p pituitary tumor resection (1994); hypogonadism; Wolff-Parkinson-White Syndrome; cardiomyopathy; acute blood loss anemia; adjustment disorder with depressed mood   Patient Stated Goals "I'd like to be able to walk by myself with no assistance, stand up out of a chair by myself, be able to get up and down stairs without assistance.   Currently in Pain? Yes   Pain Score 5    Pain Location Neck   Pain Orientation Right;Left   Pain Descriptors / Indicators Aching;Sore;Other (Comment)  "Stiff"   Pain Type Acute pain   Pain Onset 1 to 4 weeks ago   Pain Frequency Constant   Aggravating Factors  shoulder exercises, looking down at computer   Pain Relieving Factors rest   Multiple Pain Sites No                         [facility] Adult PT Treatment/Exercise - 01/11/15 0001    Ambulation/Gait   Ambulation/Gait Yes   Ambulation/Gait Assistance 6: Modified independent (Device/Increase time);5: Supervision;4: Min guard   Ambulation/Gait Assistance Details x800' outdoors with [facility] with supervision and no overt LOB; x345' indoors (x115' with SPC with Mod I; x230 without AD with min guard). When pt ambulating with SPC outdoors, Pt provided cueing for technique to increase stability with negotiation  of banked inclined/declined surfaces (focused on increasing distance of SPC from body to widen BOS and increase stability). When ambulating over level, indoor surfaces without AD, verbal/tactile cueing focused on increasing lateral weight shift to L side, increasing anterior weight shift (emphasis during RLE advancemenr), and increasing L pelvic protraction.   Ambulation Distance (Feet) 800 Feet   Assistive device None;Other (Comment);Straight cane  L AFO; L knee brace, bariatric SPC   Gait Pattern Step-through pattern;Decreased hip/knee flexion - left;Decreased weight shift to left;Left flexed knee in  stance;Lateral trunk lean to right;Poor foot clearance - left;Lateral hip instability  L Trendelenburg; L pelvic retraction   Ambulation Surface Level;Unlevel;Indoor;Outdoor;Paved   Ramp 5: Supervision   Ramp Details (indicate cue type and reason) using bariatric SPC   Curb 5: Supervision   Curb Details (indicate cue type and reason) using bariatric SPC   Neuro Re-ed    Neuro Re-ed Details  With verbal/demo cueing from this PT, pt gave effective return demo of techniques to increase postural stability/control at work. For example: modifying foot position t staggered stance during forward reaching, A/P weight shifting to increase stability; widening BOS, and lowering COM with balance perturbations.                PT Education - 01/11/15 1251    Education provided Yes   Education Details Adjusted SPC (received today). Pt now safe to ambulate in home with [facility] when wearing L knee brace and L AFO. Recommending pt continue to utilize [facility] at work and that pt have supervision of one parent to ambulate outdoors using [place].   Person(s) Educated Patient;Parent(s)   Methods Explanation;Demonstration   Comprehension Verbalized understanding;Returned demonstration          PT Short Term Goals - 01/04/15 1507    PT SHORT TERM GOAL #1   Title Pt will perform initial HEP with mod I using paper handout to indicate safe HEP compliance, maximize functional gains made in PT. Target date: 11/06/14   Baseline Met 9/22.   Status Achieved   PT SHORT TERM GOAL #2   Title Perform 6 Minute Walk Test to assess/address functional endurance. Target date: 11/06/14   Baseline 9/20: 556' with RW   Status Achieved   PT SHORT TERM GOAL #3   Title Perform TUG to assess/address efficiency of functional mobility. Target date: 11/06/14   Baseline 9/20: TUG time = 28.3 seconds with RW   Status Achieved   PT SHORT TERM GOAL #4   Title Pt will ambulate 200' over level, indoor surfaces with mod I using LRAD to  indicate increased safety/indepedence with household ambulation. Target date: 11/06/14   Baseline Met 10/12 with bariatric RW.   Status Achieved   PT SHORT TERM GOAL #5   Title Pt will negotiate 14 stairs with B rails and min A of single person to indicate increased independence accessing second floor of home. Target date: 11/06/14   Baseline 10/10: required min guard of PT for stairs x16   Status Achieved   PT SHORT TERM GOAL #6   Title Complete Berg Balance Scale and improve score by 4 points from baseline to progress toward significant improvement in functional standing balance. Target date: 01/02/15   Baseline 11/18: Berg score = 34/56   Status Achieved   PT SHORT TERM GOAL #7   Title Pt will improve gait velocity from 1.94 ft/sec to 2.24 ft/sec to indicate increased efficiency of ambulation. Target date: 01/02/15   Baseline 11/15: 1.94 ft/sec using [facility]  12/15: 2.54 ft/sec with [facility]   Status Achieved   PT SHORT TERM GOAL #8   Title Pt will ambulate 44' over level, indoor surfaces without AD requiring supervision to progress toward independence with household mobility. Target date: 01/02/15   Baseline 12/15: ambulated x115' without AD with supervision   Status Achieved           PT Long Term Goals - 12/08/14 1452    PT LONG TERM GOAL #1   Title Pt will improve 6 Minute Walk distance by 150' from baseline to indicate significant improvement in functional endurance. Target date: 12/04/14   Baseline 9/20: baseline 6MWT distance = 556' with RW;  11/15: 6MWT distance = 910 ' with RW   Status Achieved   PT LONG TERM GOAL #2   Title Pt will improve TUG score by 10.8 seconds from baseline to indicate significant improvement in efficiency of functional mobility.  Target date: 12/04/14   Baseline 9/20: baseline TUG = 28.4 sec with RW;  11/15: TUG = 17.67 with RW   Status Achieved   PT LONG TERM GOAL #3   Title Pt will ambulate 500' over unlevel, paved surfaces with mod I using LRAD  to indicate increased stability/independence with community mobility. Target date: 12/04/14   Baseline Met 11/10 with [facility].   Status Achieved   PT LONG TERM GOAL #4   Title Pt will negotiate standard ramp and curb step with mod I using LRAD to indicate increased safety/indepedence traversing community obstacles. Target date: 12/04/14   Baseline Met 11/10 with [facility].   Status Achieved   PT LONG TERM GOAL #5   Title Pt will negotiate 14 stairs with 2 rails and mod I to indicate increased independence accessing second floor of home.  Target date: 12/04/14   Baseline Met 11/27/14.   Status Achieved   PT LONG TERM GOAL #6   Title Pt will improve Berg score by 8 points from baseline to indicate significant improvement in functional standing balance. Target date: 01/30/15   Baseline 11/18: baseline Berg score = 34/56   Status New   PT LONG TERM GOAL #7   Title Pt will improve gait velocity to > 2.62 ft/sec to indicate status of community ambulatory.  Target date: 01/30/15   Status New   PT LONG TERM GOAL #8   Title Pt will perform TUG in < 13.5 seconds to indicate decreased fall risk. Target date: 01/30/15   Status New   PT LONG TERM GOAL  #9   TITLE Pt will ambulate 37' over level, indoor surfaces without AD with mod I to indicate increased stability/independence with household ambulation.  Target date: 01/30/15   Status New               Plan - 01/11/15 1308    Clinical Impression Statement Session focused on gait training over unlevel, outdoor surfaces with SPC as well as gait training indoors without AD. Pt now safe to ambulate in home enivornment using SPC (received today) but continuing to recommend [facility] at work and with community mobility. Pt safe to ambulate outdoors with supervision of parent. Pt/mother verbalized understanding and were in full agreement.   Pt will benefit from skilled therapeutic intervention in order to improve on the following deficits Abnormal gait;Decreased  activity tolerance;Decreased balance;Decreased endurance;Decreased coordination;Decreased mobility;Decreased strength;Impaired sensation;Pain   Rehab Potential Good   PT Frequency 2x / week   PT Duration 8 weeks   PT Treatment/Interventions Vestibular;ADLs/Self Care Home Management;DME Instruction;Balance training;Therapeutic  exercise;Therapeutic activities;Functional mobility training;Stair training;Gait training;Neuromuscular re-education;Patient/family education;Electrical Stimulation   PT Next Visit Plan Continue gait training without AD, strengthening of B hips, and balance activities (dynamic surfaces) at // bars. If safe, consider progressing to [facility] for community mobility (and work).   Consulted and Agree with Plan of Care Patient;Family member/caregiver   Family Member Consulted mother        Problem List Patient Active Problem List   Diagnosis Date Noted  . Adjustment disorder with depressed mood   . Tetraplegia ([place]) 09/19/2014  . Acute blood loss anemia 09/19/2014  . Cervical spondylosis with myelopathy 09/18/2014  . Radiculopathy of arm 08/28/2014  . Hypogonadism male 09/08/2013  . 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 Cardiomyopathy 04/01/2011  . Wolff-Parkinson-White (WPW) syndrome 04/01/2011   Billie Ruddy, PT, DPT [facility] [address] [place], [place], [address] Phone: [phone]   Fax:  [phone] 01/11/2015, 1:15 PM   Name: Mark Gallagher MRN: [id number] Date of Birth: 02/12/1970

## 2015-01-11 NOTE — Therapy (Signed)
[facility] [address] [place], [place], [address] Phone: [phone]   Fax:  [phone]  Occupational Therapy Treatment  Patient Details  Name: Mark Gallagher MRN: [id number] Date of Birth: November 17, 1970 No Data Recorded  Encounter Date: 01/11/2015      OT End of Session - 01/11/15 1257    Visit Number 28   Number of Visits 33   Date for OT Re-Evaluation 01/29/15   Authorization Type [employer], 60 visit limit combined   Authorization Time Period renewal completed 11/30/14, wk 6/8   Authorization - Visit Number 28   Authorization - Number of Visits 28   OT Start Time 6962   OT Stop Time 1238   OT Time Calculation (min) 41 min   Activity Tolerance Patient tolerated treatment well   Behavior During Therapy [facility] for tasks assessed/performed      Past Medical History  Diagnosis Date  . Thyroid disease   . Cancer ([place])   . 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 Testosterone deficiency   . Knee pain, bilateral   . History of pituitary tumor     Past Surgical History  Procedure Laterality Date  . Cervical disc surgery  2007    Anterior C4/5 vertebrectomy and c3-C6 laminoplasty with plating  . Craniotomy for tumor      pituitary adenoma    There were no vitals filed for this visit.  Visit Diagnosis:  Tetraparesis ([place])  Decreased coordination  Muscle weakness (generalized)      Subjective Assessment - 01/11/15 1250    Subjective  Pt reports that he is enjoying working part time (~4 hours per day) and that he tries to sit and stand during work hours prn. Pt states his HEP is going great and he feels it is helping him become stronger.    Patient is accompained by: Family member  mother   Pertinent History C3-T2 fusion 09/10/14 with cardiac surgery after surgery, hx of previous C1-C6 fusion (2007)   Limitations cleared for overhead reaching; lifting restriction of 30lbs, cleared for shoulder strengthening, **Do not work on  neck motion   Patient Stated Goals return to work   Currently in Pain? Yes   Pain Score 5    Pain Location Neck   Pain Orientation Right;Left   Pain Descriptors / Indicators Aching;Sore  "stiff"   Pain Type Acute pain   Pain Onset 1 to 4 weeks ago   Pain Frequency Constant   Aggravating Factors  certain shoulder exercises, looking down at computer   Pain Relieving Factors rest   Multiple Pain Sites No          Skilled intervention focusing on BUE HEP using yellow and red theraband. Pt's mother present and had copy of HEP. Therapist ensured patient had correct posture, positioning, and technique for each exercise to ensure safety and accuracy with each exercise. Pt required mod verbal cues, but with good carryover after education and cueing.   See Below..           OT Treatments/Exercises (OP) - 01/11/15 0001    Exercises   Exercises Shoulder;Elbow   Shoulder Exercises: Seated   Extension Strengthening;Right;Left   Theraband Level (Shoulder Extension) Level 1 (Yellow);Level 2 (Red)   Horizontal ABduction Strengthening;Right;Left   Theraband Level (Shoulder Horizontal ABduction) Level 1 (Yellow);Level 2 (Red)   Flexion Strengthening;Right;Left   Theraband Level (Shoulder Flexion) Level 1 (Yellow);Level 2 (Red)   Abduction Strengthening;Right;Left   Theraband Level (Shoulder ABduction) Level 1 (Yellow);Level  2 (Red)   Elbow Exercises   Theraband Level (Elbow Flexion) Level 1 (Yellow);Level 2 (Red)   Elbow Extension Strengthening;Right;Left   Theraband Level (Elbow Extension) Level 1 (Yellow);Level 2 (Red)     NO NECK ROM           OT Education - 01/11/15 1252    Education provided Yes   Education Details Continue HEP   Person(s) Educated Patient;Parent(s)   Methods Explanation;Demonstration   Comprehension Verbalized understanding;Returned demonstration          OT Short Term Goals - 01/11/15 1257    OT SHORT TERM GOAL #1   Title Pt will verbalize  understanding of coordination/hand strength HEP--check STGs 11/08/14   Time 4   Period Weeks   Status Achieved   OT SHORT TERM GOAL #2   Title Pt will perfom BADLs mod I.   Baseline min A bathing   Time 4   Period Weeks   Status Achieved  11/06/14  supervision for shower transfer, min A for R brace/neck brace;  11/27/14 met   OT SHORT TERM GOAL #3   Title Pt will improve L hand coordination for ADLs as shown by completing 9-hole peg test in less than 55sec.   Baseline 77.34sec   Time 4   Period Weeks   Status Achieved  11/06/14:  56.94sec;  11/27/14  45.69sec   OT SHORT TERM GOAL #4   Title Pt will demo at least 40lbs L grip strength for ADLs/opening containers.   Baseline 30lbs   Time 4   Period Weeks   Status Achieved  11/06/14:  45lbs   OT SHORT TERM GOAL #5   Title Pt will improve L hand coordination for ADLs as shown by completing 9-hole peg test in less than 40sec.--check 12/29/14   Baseline 45.56 secs, 34.12 secs   Time 4   Period Weeks   Status Partially Met   OT SHORT TERM GOAL #6   Title Pt will be able to perform overhead reaching with LUE for at least 65mn without rest break for incr activity tolerance.--check 12/29/14   Time 4   Period Weeks   Status Achieved   OT SHORT TERM GOAL #7   Title Pt will demo at least 55lbs L grip strength for ADLs/opening containers.--check 12/29/14   Time 4   Period Weeks   Status Achieved  12/25/14:  49lbs; 01/08/15  60lbs           OT Long Term Goals - 01/11/15 1256    OT LONG TERM GOAL #1   Title Pt will be independent with proximal UE HEP when cleared from cervical/lifting precautions.--check updated LTGs 01/29/15   Status On-going  11/27/14 pt not yet cleared from cervical precautions   OT LONG TERM GOAL #2   Title Pt will perform simple meal prep/home maintenance tasks mod I.   Status On-going  not fully met 11/30/14   OT LONG TERM GOAL #3   Title Pt will improve L hand coordination for ADLs as shown by completing  9-hole peg test in less than 37sec.   Baseline 47.88secs  11/21/14; 11/27/14  45.69sec  (revised goal to <37 instead of less than 45sec for renewal)   Status On-going   OT LONG TERM GOAL #4   Title Pt will demo at least 65lbs L grip strength for ADLs/opening containers.   Baseline revised to 60 for renewal 11/30/14; upgraded to 65lbs 01/08/15 due to goal met   Status Achieved  11/27/14:  49lbs; 12/25/14  49lbs; 01/08/15  met 60lbs   OT LONG TERM GOAL #5   Title Pt will be able to retrieve 2lb object from overhead shelf with LUE.   Time 8   Period Weeks   Status On-going  01/08/15:  unable to perform with 1lb without compensation (revised goal from 4lbs to 2lbs)   OT LONG TERM GOAL #6   Title added 01/08/15:  Pt will demo at least 140* L shoulder flexion for functional reaching.   Status On-going               Plan - 01/11/15 1253    Clinical Impression Statement Pt continues to progress towards OT goals, continue plan of care for now. Pt states he will not be coming back for therapy until January 2nd due to insurance. Pt was given instruction/education on correct postrure, technique, positioning for BUE HEP. Pt is very receptive to education and encouraged to regain his functional strength and endurance.    Pt will benefit from skilled therapeutic intervention in order to improve on the following deficits (Retired) Decreased activity tolerance;Decreased balance;Decreased mobility;Decreased strength;Pain;Impaired UE functional use;Decreased knowledge of use of DME;Decreased coordination   Rehab Potential Good   OT Frequency 2x / week   OT Duration 8 weeks   OT Treatment/Interventions Passive range of motion;Moist Heat;Cryotherapy;DME and/or AE instruction;Therapeutic activities;Energy conservation;Therapeutic exercises;Neuromuscular education;Manual Therapy;Self-care/ADL training;Therapeutic exercise;Functional Mobility Training;Patient/family education   Plan will hold further therapy  until January due to insurance visit limits    OT Home Exercise Plan Education issued: coordination 10/12/14, cane HEP and yellow band (bicep/triceps); 12/25/14 updated HEP    Consulted and Agree with Plan of Care Patient;Family member/caregiver   Family Member Consulted mother         Problem List Patient Active Problem List   Diagnosis Date Noted  . Adjustment disorder with depressed mood   . Tetraplegia ([place]) 09/19/2014  . Acute blood loss anemia 09/19/2014  . Cervical spondylosis with myelopathy 09/18/2014  . Radiculopathy of arm 08/28/2014  . Hypogonadism male 09/08/2013  . 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 Cardiomyopathy 04/01/2011  . Wolff-Parkinson-White (WPW) syndrome 04/01/2011    Chrys Racer , MS, OTR/L, CLT Pager: [phone]  01/11/2015, 1:07 PM  [facility] [address] [place], [place], [address] Phone: [phone]   Fax:  [phone]  Name: Mark Gallagher MRN: [id number] Date of Birth: August 19, 1970

## 2015-01-16 ENCOUNTER — Other Ambulatory Visit: Payer: Self-pay | Admitting: Physician Assistant

## 2015-01-16 ENCOUNTER — Ambulatory Visit (INDEPENDENT_AMBULATORY_CARE_PROVIDER_SITE_OTHER): Payer: Managed Care, Other (non HMO) | Admitting: *Deleted

## 2015-01-16 DIAGNOSIS — E291 Testicular hypofunction: Secondary | ICD-10-CM

## 2015-01-16 LAB — COMPREHENSIVE METABOLIC PANEL WITH GFR
ALT: 10 U/L (ref 9–46)
AST: 8 U/L — ABNORMAL LOW (ref 10–40)
Albumin: 4.2 g/dL (ref 3.6–5.1)
Alkaline Phosphatase: 66 U/L (ref 40–115)
BUN: 19 mg/dL (ref 7–25)
CO2: 28 mmol/L (ref 20–31)
Calcium: 9.9 mg/dL (ref 8.6–10.3)
Chloride: 101 mmol/L (ref 98–110)
Creat: 0.67 mg/dL (ref 0.60–1.35)
Glucose, Bld: 84 mg/dL (ref 65–99)
Potassium: 4 mmol/L (ref 3.5–5.3)
Sodium: 139 mmol/L (ref 135–146)
Total Bilirubin: 0.9 mg/dL (ref 0.2–1.2)
Total Protein: 6.8 g/dL (ref 6.1–8.1)

## 2015-01-16 NOTE — Progress Notes (Signed)
Patient presents for testosterone injection. Dr. Perfecto Kingdom notes indicate that he needs a PSA before his next dose. PSA ordered (and CMET). Patient will schedule follow-up visit with Dr. Everlene Farrier.

## 2015-01-16 NOTE — Progress Notes (Signed)
   Subjective:    Patient ID: Mark Gallagher, male    DOB: 07-Feb-1970, 44 y.o.   MRN: [id number]  HPI    Review of Systems     Objective:   Physical Exam        Assessment & Plan:  Patient is here for testosterone injection only of 0.5 ml per directions on pharmacy label. Patient states doctor changed dosage at last office visit at [facility].

## 2015-01-17 LAB — PSA: PSA: 0.17 ng/mL (ref <=4.00)

## 2015-01-23 ENCOUNTER — Ambulatory Visit: Payer: Managed Care, Other (non HMO) | Admitting: Rehabilitation

## 2015-01-23 ENCOUNTER — Ambulatory Visit: Payer: Managed Care, Other (non HMO) | Admitting: Occupational Therapy

## 2015-01-23 ENCOUNTER — Ambulatory Visit (INDEPENDENT_AMBULATORY_CARE_PROVIDER_SITE_OTHER): Payer: Managed Care, Other (non HMO) | Admitting: Emergency Medicine

## 2015-01-23 VITALS — BP 113/77 | HR 95 | Temp 98.2°F | Resp 18 | Ht >= 80 in | Wt 240.0 lb

## 2015-01-23 DIAGNOSIS — J209 Acute bronchitis, unspecified: Secondary | ICD-10-CM

## 2015-01-23 MED ORDER — HYDROCOD POLST-CPM POLST ER 10-8 MG/5ML PO SUER: 5.0000 mL | Freq: Two times a day (BID) | ORAL | Status: DC

## 2015-01-23 MED ORDER — AZITHROMYCIN 250 MG PO TABS: ORAL_TABLET | ORAL | Status: DC

## 2015-01-23 NOTE — Progress Notes (Signed)
Subjective:  Patient ID: Mark Gallagher, male    DOB: 1970-12-12  Age: 45 y.o. MRN: [id number]  CC: Cough; Fever; Sore Throat; and Nasal Congestion   HPI KEATEN LIRA presents    patient has nasal drainage and its watery in color. Fatigued and has occasional fever. He has no chills. Has no sore throat or ear pain. He has no nausea vomiting or stool change. Of that's productive of scant sputum. He has no wheezing or shortness of breath. 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   He has past surgical history that includes Cervical disc surgery (2007) and Craniotomy for tumor.   His  family history includes Acute myelogenous leukemia in his brother; High blood pressure in his mother.  He   reports that he has never smoked. He does not have any smokeless tobacco history on file. He reports that he does not drink alcohol or use illicit drugs.  Outpatient Prescriptions Prior to Visit  Medication Sig Dispense Refill  . Cholecalciferol (VITAMIN D3) 2000 UNITS capsule Take by mouth.    . [facility] SENNA PLUS 8.6-50 MG tablet TAKE 2 TABLETS BY MOUTH TWICE A DAY AS NEEDED FOR CONSTIPATION 100 tablet 1  . famotidine (PEPCID) 20 MG tablet Take 1 tablet (20 mg total) by mouth 2 (two) times daily. For reflux 60 tablet 1  . ferrous sulfate 325 (65 FE) MG tablet Take 1 tablet (325 mg total) by mouth 2 (two) times daily with a meal. 60 tablet 1  . hydrocortisone (CORTEF) 20 MG tablet Take 20 mg in am and 10 mg in evenings    . ibuprofen (ADVIL,MOTRIN) 200 MG tablet Take by mouth.    . levothyroxine (SYNTHROID, LEVOTHROID) 200 MCG tablet Take 1 tablet (200 mcg total) by mouth daily.    . Menthol-Methyl Salicylate (MUSCLE RUB) 10-15 % CREA Apply 1 application  topically as needed for muscle pain.  0  . octreotide (SANDOSTATIN LAR DEPOT) 30 MG injection Inject 30 mg into the muscle every 28 (twenty-eight) days. 1 each 0  . polyethylene glycol (MIRALAX / GLYCOLAX) packet Take by mouth.    . verapamil (CALAN-SR) 120 MG CR tablet Take 1 tablet by mouth daily.    Marland Kitchen acetaminophen (TYLENOL) 325 MG tablet Take 1-2 tablets (325-650 mg total) by mouth every 4 (four) hours as needed for mild pain. (Patient not taking: Reported on 01/23/2015)    . fluticasone (FLONASE) 50 MCG/ACT nasal spray Place 2 sprays into both nostrils daily. (Patient not taking: Reported on 01/23/2015) 9.9 g 1  . Naproxen Sodium (ALEVE PO) Take by mouth. Reported on 01/23/2015    . traMADol (ULTRAM) 50 MG tablet Take 1 tablet by mouth every 6 (six) hours as needed. Reported on 01/23/2015  0   Facility-Administered Medications Prior to Visit  Medication Dose Route Frequency Provider Last Rate Last Dose  . testosterone cypionate (DEPOTESTOSTERONE CYPIONATE) injection 200 mg  200 mg Intramuscular Q14 Days Darreld Mclean, MD   200 mg at 01/16/15 1205    Social History   Social History  . Marital Status: Single    Spouse Name: N/A  . Number of Children: N/A  . Years of Education: N/A   Social History Main Topics  . Smoking status: Never Smoker   . Smokeless tobacco:  None  . Alcohol Use: No  . Drug Use: No  . Sexual Activity: Not Asked   Other Topics Concern  . None   Social History Narrative     Review of Systems  Constitutional: Positive for fever and fatigue. Negative for chills and appetite change.  HENT: Positive for rhinorrhea. Negative for congestion, ear pain, postnasal drip, sinus pressure and sore throat.   Eyes: Negative for pain and redness.  Respiratory: Positive for cough. Negative for shortness of breath and wheezing.   Cardiovascular: Negative for leg swelling.  Gastrointestinal: Negative for nausea, vomiting, abdominal pain, diarrhea, constipation and blood in  stool.  Endocrine: Negative for polyuria.  Genitourinary: Negative for dysuria, urgency, frequency and flank pain.  Musculoskeletal: Negative for gait problem.  Skin: Negative for rash.  Neurological: Negative for weakness and headaches.  Psychiatric/Behavioral: Negative for confusion and decreased concentration. The patient is not nervous/anxious.     Objective:  BP 113/77 mmHg  Pulse 95  Temp(Src) 98.2 F (36.8 C) (Oral)  Resp 18  Ht 6\' 8"  (2.032 m)  Wt 240 lb (108.863 kg)  BMI 26.37 kg/m2  SpO2 97%  Physical Exam  Constitutional: He is oriented to person, place, and time. He appears well-developed and well-nourished.  HENT:  Head: Normocephalic and atraumatic.  Eyes: Conjunctivae are normal. Pupils are equal, round, and reactive to light.  Pulmonary/Chest: Effort normal and breath sounds normal. He has no wheezes. He has no rales.  Musculoskeletal: He exhibits no edema.  Neurological: He is alert and oriented to person, place, and time.  Skin: Skin is dry.  Psychiatric: He has a normal mood and affect. His behavior is normal. Thought content normal.      Assessment & Plan:   Federick was seen today for cough, fever, sore throat and nasal congestion.  Diagnoses and all orders for this visit:  Acute bronchitis, unspecified organism  Other orders -     azithromycin (ZITHROMAX) 250 MG tablet; Take 2 tabs PO x 1 dose, then 1 tab PO QD x 4 days -     chlorpheniramine-HYDROcodone (TUSSIONEX PENNKINETIC ER) 10-8 MG/5ML SUER; Take 5 mLs by mouth 2 (two) times daily.   I am having Mr. Bailin start on azithromycin and chlorpheniramine-HYDROcodone. I am also having him maintain his octreotide, acetaminophen, fluticasone, hydrocortisone, MUSCLE RUB, ferrous sulfate, famotidine, levothyroxine, Naproxen Sodium (ALEVE PO), verapamil, traMADol, polyethylene glycol, ibuprofen, Vitamin D3, and CVS SENNA PLUS. We will continue to administer testosterone cypionate.  Meds ordered this  encounter  Medications  . azithromycin (ZITHROMAX) 250 MG tablet    Sig: Take 2 tabs PO x 1 dose, then 1 tab PO QD x 4 days    Dispense:  6 tablet    Refill:  0  . chlorpheniramine-HYDROcodone (TUSSIONEX PENNKINETIC ER) 10-8 MG/5ML SUER    Sig: Take 5 mLs by mouth 2 (two) times daily.    Dispense:  60 mL    Refill:  0    Appropriate red flag conditions were discussed with the patient as well as actions that should be taken.  Patient expressed his understanding.  Follow-up: Return if symptoms worsen or fail to improve.  Roselee Culver, MD

## 2015-01-23 NOTE — Patient Instructions (Signed)

## 2015-01-29 ENCOUNTER — Ambulatory Visit: Payer: Managed Care, Other (non HMO) | Admitting: Physical Therapy

## 2015-01-29 ENCOUNTER — Ambulatory Visit: Payer: Managed Care, Other (non HMO) | Admitting: Occupational Therapy

## 2015-01-30 ENCOUNTER — Ambulatory Visit (INDEPENDENT_AMBULATORY_CARE_PROVIDER_SITE_OTHER): Payer: Managed Care, Other (non HMO) | Admitting: *Deleted

## 2015-01-30 DIAGNOSIS — E220 Acromegaly and pituitary gigantism: Secondary | ICD-10-CM

## 2015-01-30 DIAGNOSIS — E291 Testicular hypofunction: Secondary | ICD-10-CM

## 2015-01-30 MED ORDER — OCTREOTIDE ACETATE 30 MG IM KIT
30.0000 mg | PACK | Freq: Once | INTRAMUSCULAR | Status: AC
  Administered 2015-01-30: 30 mg via INTRAMUSCULAR

## 2015-01-30 NOTE — Progress Notes (Signed)
   Subjective:    Patient ID: Mark Gallagher, male    DOB: Feb 23, 1970, 45 y.o.   MRN: [id number]  HPI    Review of Systems     Objective:   Physical Exam        Assessment & Plan:  Patient here for only Sandostatin LAR [place] 30 and testosterone 0.5 ml injections.

## 2015-02-01 ENCOUNTER — Ambulatory Visit: Payer: Managed Care, Other (non HMO) | Attending: Physical Medicine & Rehabilitation | Admitting: Physical Therapy

## 2015-02-01 ENCOUNTER — Ambulatory Visit: Payer: Managed Care, Other (non HMO) | Admitting: Occupational Therapy

## 2015-02-01 DIAGNOSIS — R269 Unspecified abnormalities of gait and mobility: Secondary | ICD-10-CM

## 2015-02-01 DIAGNOSIS — M6289 Other specified disorders of muscle: Secondary | ICD-10-CM | POA: Insufficient documentation

## 2015-02-01 DIAGNOSIS — R531 Weakness: Secondary | ICD-10-CM

## 2015-02-01 DIAGNOSIS — M21372 Foot drop, left foot: Secondary | ICD-10-CM | POA: Diagnosis present

## 2015-02-01 DIAGNOSIS — R279 Unspecified lack of coordination: Secondary | ICD-10-CM | POA: Insufficient documentation

## 2015-02-01 DIAGNOSIS — M6281 Muscle weakness (generalized): Secondary | ICD-10-CM | POA: Diagnosis present

## 2015-02-01 DIAGNOSIS — R2681 Unsteadiness on feet: Secondary | ICD-10-CM | POA: Insufficient documentation

## 2015-02-01 DIAGNOSIS — R29898 Other symptoms and signs involving the musculoskeletal system: Secondary | ICD-10-CM

## 2015-02-01 DIAGNOSIS — R278 Other lack of coordination: Secondary | ICD-10-CM

## 2015-02-01 DIAGNOSIS — M25612 Stiffness of left shoulder, not elsewhere classified: Secondary | ICD-10-CM | POA: Insufficient documentation

## 2015-02-01 NOTE — Therapy (Signed)
[facility] [address] [place], [place], [address] Phone: [phone]   Fax:  [phone]  Occupational Therapy Treatment  Patient Details  Name: Mark Gallagher MRN: [id number] Date of Birth: 09-Jul-1970 No Data Recorded  Encounter Date: 02/01/2015      OT End of Session - 02/01/15 1027    Visit Number 29   Number of Visits 33   Date for OT Re-Evaluation 02/08/15   Authorization Type [employer], 60 visit limit combined   Authorization Time Period renewal completed 11/30/14, wk 7/8   Authorization - Visit Number 1   Authorization - Number of Visits 30   OT Start Time 1018   OT Stop Time 1100   OT Time Calculation (min) 42 min   Activity Tolerance Patient tolerated treatment well   Behavior During Therapy [facility] for tasks assessed/performed      Past Medical History  Diagnosis Date  . Thyroid disease   . Cancer ([place])   . 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 Testosterone deficiency   . Knee pain, bilateral   . History of pituitary tumor     Past Surgical History  Procedure Laterality Date  . Cervical disc surgery  2007    Anterior C4/5 vertebrectomy and c3-C6 laminoplasty with plating  . Craniotomy for tumor      pituitary adenoma    There were no vitals filed for this visit.  Visit Diagnosis:  Weakness generalized  Decreased coordination  Left hand weakness      Subjective Assessment - 02/01/15 1022    Subjective  Pt works 6 hours a week--Will start 8 hours next.  "Typing is slow"   Patient is accompained by: Family member   Pertinent History C3-T2 fusion 09/10/14 with cardiac surgery after surgery, hx of previous C1-C6 fusion (2007)   Limitations cleared for overhead reaching; lifting restriction of 30lbs, cleared for shoulder strengthening, **Do not work on neck motion   Patient Stated Goals return to work   Currently in Pain? Yes   Pain Score 3    Pain Location --  between shoulder blade   Pain Orientation  Right;Left   Pain Descriptors / Indicators Aching;Sore   Aggravating Factors  certain exercises, looking down at computer    Pain Relieving Factors rest                      OT Treatments/Exercises (OP) - 02/01/15 0001    ADLs   Work Typing test with 9wpm, 57% accuracy (affected by caps lock); then 12wpm, 91% accuracy, 11wpm net speed.   Then typing game "bubbles" for improved typing/coordination in prep for work with score of 206, 230   Shoulder Exercises: Supine   Other Supine Exercises shoulder flex with 1lb wt x15, Then shoulder circumduction x15 each direction with 2lb wt. with LUE.   Other Supine Exercises In sidelying, ER with 1lb wt.   Shoulder Exercises: Seated   Flexion Strengthening;Left  with 1lb wt.  x15   Abduction Strengthening;Left;10 reps  1lb wt.    Other Seated Exercises Arm bike x42mn level 5 (forward/backwards) without rest for conditioning.   Shoulder Exercises: Prone   Other Prone Exercises In prone over ball, scapular retraction with 1lb wt with elbow bent, then shoulder ext with 1lb wt, and abduction with 1lb wt x15 each                  OT Short Term Goals - 01/11/15 1257  OT SHORT TERM GOAL #1   Title Pt will verbalize understanding of coordination/hand strength HEP--check STGs 11/08/14   Time 4   Period Weeks   Status Achieved   OT SHORT TERM GOAL #2   Title Pt will perfom BADLs mod I.   Baseline min A bathing   Time 4   Period Weeks   Status Achieved  11/06/14  supervision for shower transfer, min A for R brace/neck brace;  11/27/14 met   OT SHORT TERM GOAL #3   Title Pt will improve L hand coordination for ADLs as shown by completing 9-hole peg test in less than 55sec.   Baseline 77.34sec   Time 4   Period Weeks   Status Achieved  11/06/14:  56.94sec;  11/27/14  45.69sec   OT SHORT TERM GOAL #4   Title Pt will demo at least 40lbs L grip strength for ADLs/opening containers.   Baseline 30lbs   Time 4   Period Weeks    Status Achieved  11/06/14:  45lbs   OT SHORT TERM GOAL #5   Title Pt will improve L hand coordination for ADLs as shown by completing 9-hole peg test in less than 40sec.--check 12/29/14   Baseline 45.56 secs, 34.12 secs   Time 4   Period Weeks   Status Partially Met   OT SHORT TERM GOAL #6   Title Pt will be able to perform overhead reaching with LUE for at least 109mn without rest break for incr activity tolerance.--check 12/29/14   Time 4   Period Weeks   Status Achieved   OT SHORT TERM GOAL #7   Title Pt will demo at least 55lbs L grip strength for ADLs/opening containers.--check 12/29/14   Time 4   Period Weeks   Status Achieved  12/25/14:  49lbs; 01/08/15  60lbs           OT Long Term Goals - 02/01/15 1113    OT LONG TERM GOAL #1   Title Pt will be independent with proximal UE HEP when cleared from cervical/lifting precautions.--check updated LTGs 01/29/15   Status Achieved  11/27/14 pt not yet cleared from cervical precautions, met 02/01/15   OT LONG TERM GOAL #2   Title Pt will perform simple meal prep/home maintenance tasks mod I.   Status On-going  not fully met 11/30/14   OT LONG TERM GOAL #3   Title Pt will improve L hand coordination for ADLs as shown by completing 9-hole peg test in less than 37sec.   Baseline 47.88secs  11/21/14; 11/27/14  45.69sec  (revised goal to <37 instead of less than 45sec for renewal)   Status On-going   OT LONG TERM GOAL #4   Title Pt will demo at least 65lbs L grip strength for ADLs/opening containers.   Baseline revised to 60 for renewal 11/30/14; upgraded to 65lbs 01/08/15 due to goal met   Status Achieved  11/27/14:  49lbs; 12/25/14  49lbs; 01/08/15  met 60lbs   OT LONG TERM GOAL #5   Title Pt will be able to retrieve 2lb object from overhead shelf with LUE.   Time 8   Period Weeks   Status On-going  01/08/15:  unable to perform with 1lb without compensation (revised goal from 4lbs to 2lbs)   OT LONG TERM GOAL #6   Title added  01/08/15:  Pt will demo at least 140* L shoulder flexion for functional reaching.   Status On-going  Plan - 02/01/15 1045    Clinical Impression Statement Pt continues to progress towards OT goals.  However, continues to demo L shoulder weakness and decr coordination affecting ADLs and work tasks.   Plan begin checking remaining goals with anticipated renewal (for 4 weeks at 1x/wk?)   [place] Education issued: coordination 10/12/14, cane HEP and yellow band (bicep/triceps); 12/25/14 updated HEP    Consulted and Agree with Plan of Care Patient;Family member/caregiver   Family Member Consulted mother         Problem List Patient Active Problem List   Diagnosis Date Noted  . Adjustment disorder with depressed mood   . Tetraplegia ([place]) 09/19/2014  . Acute blood loss anemia 09/19/2014  . Cervical spondylosis with myelopathy 09/18/2014  . Radiculopathy of arm 08/28/2014  . Hypogonadism male 09/08/2013  . 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 Cardiomyopathy 04/01/2011  . Wolff-Parkinson-White (WPW) syndrome 04/01/2011    [facility] 02/01/2015, 12:52 PM  [facility] [address] [place], [place], [address] Phone: [phone]   Fax:  [phone]  Name: Mark Gallagher MRN: [id number] Date of Birth: [birth year]  Vianne Bulls, OTR/L 02/01/2015 12:52 PM

## 2015-02-01 NOTE — Therapy (Signed)
[facility] [address] [place], [place], [address] Phone: [phone]   Fax:  [phone]  Physical Therapy Treatment  Patient Details  Name: Mark Gallagher MRN: [id number] Date of Birth: 04/22/1970 No Data Recorded  Encounter Date: 02/01/2015      PT End of Session - 02/01/15 1218    Visit Number 33   Number of Visits 37   Date for PT Re-Evaluation 02/10/15  modified date due to interruption of PT   Authorization Type [employer]    Authorization - Visit Number 1   Authorization - Number of Visits 30  Annual PT visit limit = 30   PT Start Time 1104   PT Stop Time 1154   PT Time Calculation (min) 50 min   Equipment Utilized During Treatment Gait belt   Activity Tolerance Patient tolerated treatment well   Behavior During Therapy WFL for tasks assessed/performed      Past Medical History  Diagnosis Date  . Thyroid disease   . Cancer ([place])   . 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 Testosterone deficiency   . Knee pain, bilateral   . History of pituitary tumor     Past Surgical History  Procedure Laterality Date  . Cervical disc surgery  2007    Anterior C4/5 vertebrectomy and c3-C6 laminoplasty with plating  . Craniotomy for tumor      pituitary adenoma    There were no vitals filed for this visit.  Visit Diagnosis:  Abnormality of gait  Weakness generalized  Unsteadiness      Subjective Assessment - 02/01/15 1104    Subjective Pt has returned to work 6 hours/day with success. Plans to return to 8 hour work day. Has had bronchitis and was on a lot of medication this past week.   Patient is accompained by: Family member  mother   Pertinent History * Restrictions: no lifting > 30 lbs; do not work on neck ROM.  PMH: cervical spondylosis with myelopathy; s/p cervical vertebrectomy C4/5 with C3-6 plating 2007 acromegaly s/p pituitary tumor resection (1994); hypogonadism; Wolff-Parkinson-White Syndrome; cardiomyopathy;  acute blood loss anemia; adjustment disorder with depressed mood   Patient Stated Goals "I'd like to be able to walk by myself with no assistance, stand up out of a chair by myself, be able to get up and down stairs without assistance.   Currently in Pain? Yes   Pain Score 3    Pain Location Other (Comment)  between shoulder blades   Pain Orientation Right;Left   Pain Descriptors / Indicators Aching;Sore   Pain Type Acute pain   Pain Onset 1 to 4 weeks ago   Pain Frequency Constant   Aggravating Factors  exercises, looking down at computer   Pain Relieving Factors rest   Multiple Pain Sites No                         [facility] Adult PT Treatment/Exercise - 02/01/15 1126    Ambulation/Gait   Ambulation/Gait Yes   Ambulation/Gait Assistance 5: Supervision;4: Min guard;4: Min assist   Ambulation/Gait Assistance Details x700' outdoors with SPC requiring min guard to min A due to L toe catch x2. Gait x200' with SPC over level, indoor surfaces with supervision to min guard due to L toe catch x1. Cueing focused on upright posture (as pt tends to exhibit anterior lean, forward momentum as gait speed increases) as well as increasing L hip/knee flexion or consistent LLE clearance. Gait  x80' over level, indoor surfaces with min guard to min A due to anterior LOB due to significant L toe catch.   Ambulation Distance (Feet) 980 Feet   Assistive device None;Other (Comment);Straight cane  L AFO; L knee brace, bariatric SPC   Gait Pattern Step-through pattern;Decreased hip/knee flexion - left;Decreased weight shift to left;Left flexed knee in stance;Lateral trunk lean to right;Poor foot clearance - left;Lateral hip instability  L Trendelenburg; anterior lean   Ambulation Surface Level;Unlevel;Indoor;Outdoor;Paved   Stairs No   Ramp 5: Supervision;4: Min assist   Ramp Details (indicate cue type and reason) min guard with bari SPC   Curb 5: Supervision   Curb Details (indicate cue type and  reason) with bari [facility]   Berg Balance Test   Sit to Stand Able to stand  independently using hands   Standing Unsupported Able to stand safely 2 minutes   Sitting with Back Unsupported but Feet Supported on Floor or Stool Able to sit safely and securely 2 minutes   Stand to Sit Controls descent by using hands   Transfers Able to transfer safely, definite need of hands   Standing Unsupported with Eyes Closed Able to stand 10 seconds safely   Standing Ubsupported with Feet Together Able to place feet together independently and stand 1 minute safely   From Standing, Reach Forward with Outstretched Arm Can reach confidently >25 cm (10")   From Standing Position, Pick up Object from Floor Able to pick up shoe safely and easily   From Standing Position, Turn to Look Behind Over each Shoulder Looks behind one side only/other side shows less weight shift   Turn 360 Degrees Needs close supervision or verbal cueing   Standing Unsupported, Alternately Place Feet on Step/Stool Able to complete 4 steps without aid or supervision   Standing Unsupported, One Foot in Front Able to take small step independently and hold 30 seconds   Standing on One Leg Able to lift leg independently and hold equal to or more than 3 seconds   Total Score 43                PT Education - 02/01/15 1219    Education provided Yes   Education Details Merrilee Jansky findings, progress, and continued fall risk.   Person(s) Educated Patient;Parent(s)   Methods Explanation   Comprehension Verbalized understanding          PT Short Term Goals - 01/04/15 1507    PT SHORT TERM GOAL #1   Title Pt will perform initial HEP with mod I using paper handout to indicate safe HEP compliance, maximize functional gains made in PT. Target date: 11/06/14   Baseline Met 9/22.   Status Achieved   PT SHORT TERM GOAL #2   Title Perform 6 Minute Walk Test to assess/address functional endurance. Target date: 11/06/14   Baseline 9/20: 556' with RW    Status Achieved   PT SHORT TERM GOAL #3   Title Perform TUG to assess/address efficiency of functional mobility. Target date: 11/06/14   Baseline 9/20: TUG time = 28.3 seconds with RW   Status Achieved   PT SHORT TERM GOAL #4   Title Pt will ambulate 200' over level, indoor surfaces with mod I using LRAD to indicate increased safety/indepedence with household ambulation. Target date: 11/06/14   Baseline Met 10/12 with bariatric RW.   Status Achieved   PT SHORT TERM GOAL #5   Title Pt will negotiate 14 stairs with B rails and min A of  single person to indicate increased independence accessing second floor of home. Target date: 11/06/14   Baseline 10/10: required min guard of PT for stairs x16   Status Achieved   PT SHORT TERM GOAL #6   Title Complete Berg Balance Scale and improve score by 4 points from baseline to progress toward significant improvement in functional standing balance. Target date: 01/02/15   Baseline 11/18: Berg score = 34/56   Status Achieved   PT SHORT TERM GOAL #7   Title Pt will improve gait velocity from 1.94 ft/sec to 2.24 ft/sec to indicate increased efficiency of ambulation. Target date: 01/02/15   Baseline 11/15: 1.94 ft/sec using [facility]      12/15: 2.54 ft/sec with [facility]   Status Achieved   PT SHORT TERM GOAL #8   Title Pt will ambulate 39' over level, indoor surfaces without AD requiring supervision to progress toward independence with household mobility. Target date: 01/02/15   Baseline 12/15: ambulated x115' without AD with supervision   Status Achieved           PT Long Term Goals - 02/01/15 1138    PT LONG TERM GOAL #1   Title Pt will improve 6 Minute Walk distance by 150' from baseline to indicate significant improvement in functional endurance. Target date: 12/04/14   Baseline 9/20: baseline 6MWT distance = 556' with RW;  11/15: 6MWT distance = 910 ' with RW   Status Achieved   PT LONG TERM GOAL #2   Title Pt will improve TUG score by 10.8  seconds from baseline to indicate significant improvement in efficiency of functional mobility.  Target date: 12/04/14   Baseline 9/20: baseline TUG = 28.4 sec with RW;  11/15: TUG = 17.67 with RW   Status Achieved   PT LONG TERM GOAL #3   Title Pt will ambulate 500' over unlevel, paved surfaces with mod I using LRAD to indicate increased stability/independence with community mobility. Target date: 12/04/14   Baseline Met 11/10 with [facility].   Status Achieved   PT LONG TERM GOAL #4   Title Pt will negotiate standard ramp and curb step with mod I using LRAD to indicate increased safety/indepedence traversing community obstacles. Target date: 12/04/14   Baseline Met 11/10 with [facility].   Status Achieved   PT LONG TERM GOAL #5   Title Pt will negotiate 14 stairs with 2 rails and mod I to indicate increased independence accessing second floor of home.  Target date: 12/04/14   Baseline Met 11/27/14.   Status Achieved   PT LONG TERM GOAL #6   Title Pt will improve Berg score by 8 points from baseline to indicate significant improvement in functional standing balance. Target date: 02/06/15   Baseline 11/18: baseline Berg score = 34/56; 1/12: Berg score = 43/56   Status Achieved   PT LONG TERM GOAL #7   Title Pt will improve gait velocity to > 2.62 ft/sec to indicate status of community ambulatory.  Target date: 02/06/15  Modified target date due to interruption of care   Status On-going   PT LONG TERM GOAL #8   Title Pt will perform TUG in < 13.5 seconds to indicate decreased fall risk. Target date: 02/06/15  Modified target date due to interruption of care   Status On-going   PT LONG TERM GOAL  #9   TITLE Pt will ambulate 64' over level, indoor surfaces without AD with mod I to indicate increased stability/independence with household ambulation.  Target date: 02/06/15  Modified target date due to interruption of care   Status On-going               Plan - 02/01/15 1232    Clinical Impression  Statement Session focused on assessing functional progress of gait stability and functional standing balance LTG for Berg met, as pt improved score from 34 to 43/56. However, score indicates continued fall risk. Pt continues to require min guard to min A for gait without AD and close supervision to min guard for gait with SPC outdoors. Gait stability limited by decreased sensory awareness in LLE; discussed with pt/mother possiblity of trialing compressive garments at LLE for increased sensory input.   Pt will benefit from skilled therapeutic intervention in order to improve on the following deficits Abnormal gait;Decreased activity tolerance;Decreased balance;Decreased endurance;Decreased coordination;Decreased mobility;Decreased strength;Impaired sensation;Pain   Rehab Potential Good   PT Frequency 2x / week   PT Duration 8 weeks   PT Treatment/Interventions Vestibular;ADLs/Self Care Home Management;DME Instruction;Balance training;Therapeutic exercise;Therapeutic activities;Functional mobility training;Stair training;Gait training;Neuromuscular re-education;Patient/family education;Electrical Stimulation   PT Next Visit Plan * Continue checking LTG's; Benjie Karvonen will recert Thurs. Continue gait training without AD; hip strengthening; balance training with focus on single limb stance stability. Consider other bracing options (pending insurance coverage) and mompressive garment at LLE for increased sensory input, decrease L toe catch.   Consulted and Agree with Plan of Care Patient;Family member/caregiver   Family Member Consulted mother        Problem List Patient Active Problem List   Diagnosis Date Noted  . Adjustment disorder with depressed mood   . Tetraplegia ([place]) 09/19/2014  . Acute blood loss anemia 09/19/2014  . Cervical spondylosis with myelopathy 09/18/2014  . Radiculopathy of arm 08/28/2014  . Hypogonadism male 09/08/2013  . 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 Cardiomyopathy 04/01/2011  .  Wolff-Parkinson-White (WPW) syndrome 04/01/2011    Billie Ruddy, PT, DPT [facility] [address] [place], [place], [address] Phone: [phone]   Fax:  [phone] 02/01/2015, 12:39 PM  Name: ATHANASIOS HELDMAN MRN: [id number] Date of Birth: 03/07/1970

## 2015-02-06 ENCOUNTER — Ambulatory Visit: Payer: Managed Care, Other (non HMO) | Admitting: Rehabilitation

## 2015-02-06 ENCOUNTER — Ambulatory Visit: Payer: Managed Care, Other (non HMO) | Admitting: Occupational Therapy

## 2015-02-06 ENCOUNTER — Encounter: Payer: Self-pay | Admitting: Rehabilitation

## 2015-02-06 DIAGNOSIS — R29898 Other symptoms and signs involving the musculoskeletal system: Secondary | ICD-10-CM

## 2015-02-06 DIAGNOSIS — R531 Weakness: Secondary | ICD-10-CM

## 2015-02-06 DIAGNOSIS — R278 Other lack of coordination: Secondary | ICD-10-CM

## 2015-02-06 DIAGNOSIS — R269 Unspecified abnormalities of gait and mobility: Secondary | ICD-10-CM

## 2015-02-06 DIAGNOSIS — R279 Unspecified lack of coordination: Secondary | ICD-10-CM

## 2015-02-06 DIAGNOSIS — R2681 Unsteadiness on feet: Secondary | ICD-10-CM

## 2015-02-06 NOTE — Therapy (Signed)
[facility] [address] [place], [place], [address] Phone: [phone]   Fax:  [phone]  Occupational Therapy Treatment  Patient Details  Name: Mark Gallagher MRN: [id number] Date of Birth: 1970/02/02 No Data Recorded  Encounter Date: 02/06/2015      OT End of Session - 02/06/15 1110    Visit Number 30   Number of Visits 33   Date for OT Re-Evaluation 02/08/15   Authorization Type [employer], 60 visit limit combined   Authorization Time Period renewal completed 11/30/14, wk 8/8   Authorization - Visit Number 2   Authorization - Number of Visits 30   OT Start Time 1105   OT Stop Time 1145   OT Time Calculation (min) 40 min   Activity Tolerance Patient tolerated treatment well   Behavior During Therapy [facility] for tasks assessed/performed      Past Medical History  Diagnosis Date  . Thyroid disease   . Cancer ([place])   . 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 Testosterone deficiency   . Knee pain, bilateral   . History of pituitary tumor     Past Surgical History  Procedure Laterality Date  . Cervical disc surgery  2007    Anterior C4/5 vertebrectomy and c3-C6 laminoplasty with plating  . Craniotomy for tumor      pituitary adenoma    There were no vitals filed for this visit.  Visit Diagnosis:  Weakness generalized  Decreased coordination  Left hand weakness      Subjective Assessment - 02/06/15 1108    Subjective  Yesterday was the first day working 8 hours.  Pt reports continued fatigue and that after work and exercise, he does not feel like doing much around the house.   Patient is accompained by: Family member   Pertinent History C3-T2 fusion 09/10/14 with cardiac surgery after surgery, hx of previous C1-C6 fusion (2007)   Limitations cleared for overhead reaching; lifting restriction of 30lbs, cleared for shoulder strengthening, **Do not work on neck motion   Patient Stated Goals return to work   Currently in  Pain? Yes   Pain Score 4    Pain Location Scapula   Pain Orientation Right;Left   Pain Descriptors / Indicators Aching;Sore   Pain Type Acute pain   Pain Frequency Constant   Aggravating Factors  exercises, looking down at computer   Pain Relieving Factors rest            [facility] OT Assessment - 02/06/15 0001    AROM   Right Shoulder Flexion 155 Degrees   Left Shoulder Flexion 145 Degrees                  OT Treatments/Exercises (OP) - 02/06/15 0001    Shoulder Exercises: Supine   Shoulder Flexion Weight (lbs) AAROM with cane    ABduction AAROM;10 reps  with cane self-stretch   Other Supine Exercises shoulder flex with 2lb wt.  x 10   Shoulder Exercises: Prone   Other Prone Exercises In prone over ball, scapular retraction with 2lb wt with elbow bent, then shoulder ext with 1lb wt, and abduction with 1lb wt x10 each   Functional Reaching Activities   High Level Functional reaching with LUE to place small pegs in vertical pegboard for incr coordination and activity tolerance with min drops  x49mn without rest.     Began checking goals and discussing progress and plan to renew 1x week for 4 weeks.  OT Short Term Goals - 01/11/15 1257    OT SHORT TERM GOAL #1   Title Pt will verbalize understanding of coordination/hand strength HEP--check STGs 11/08/14   Time 4   Period Weeks   Status Achieved   OT SHORT TERM GOAL #2   Title Pt will perfom BADLs mod I.   Baseline min A bathing   Time 4   Period Weeks   Status Achieved  11/06/14  supervision for shower transfer, min A for R brace/neck brace;  11/27/14 met   OT SHORT TERM GOAL #3   Title Pt will improve L hand coordination for ADLs as shown by completing 9-hole peg test in less than 55sec.   Baseline 77.34sec   Time 4   Period Weeks   Status Achieved  11/06/14:  56.94sec;  11/27/14  45.69sec   OT SHORT TERM GOAL #4   Title Pt will demo at least 40lbs L grip strength for ADLs/opening  containers.   Baseline 30lbs   Time 4   Period Weeks   Status Achieved  11/06/14:  45lbs   OT SHORT TERM GOAL #5   Title Pt will improve L hand coordination for ADLs as shown by completing 9-hole peg test in less than 40sec.--check 12/29/14   Baseline 45.56 secs, 34.12 secs   Time 4   Period Weeks   Status Partially Met   OT SHORT TERM GOAL #6   Title Pt will be able to perform overhead reaching with LUE for at least 16mn without rest break for incr activity tolerance.--check 12/29/14   Time 4   Period Weeks   Status Achieved   OT SHORT TERM GOAL #7   Title Pt will demo at least 55lbs L grip strength for ADLs/opening containers.--check 12/29/14   Time 4   Period Weeks   Status Achieved  12/25/14:  49lbs; 01/08/15  60lbs           OT Long Term Goals - 02/06/15 1110    OT LONG TERM GOAL #1   Title Pt will be independent with proximal UE HEP when cleared from cervical/lifting precautions.--check updated LTGs 01/29/15   Status Achieved  11/27/14 pt not yet cleared from cervical precautions, met 02/01/15   OT LONG TERM GOAL #2   Title Pt will perform simple meal prep/home maintenance tasks mod I.   Status Partially Met  not fully met 11/30/14;  02/06/15  only not performing cooking due to fatigue with HEP and return to work (parents performing cooking) but is performing home maintenance tasks   OT LONG TERM GOAL #3   Title Pt will improve L hand coordination for ADLs as shown by completing 9-hole peg test in less than 37sec.   Baseline 47.88secs  11/21/14; 11/27/14  45.69sec  (revised goal to <37 instead of less than 45sec for renewal)   Status On-going   OT LONG TERM GOAL #4   Title Pt will demo at least 65lbs L grip strength for ADLs/opening containers.   Baseline revised to 60 for renewal 11/30/14; upgraded to 65lbs 01/08/15 due to goal met   Status Achieved  11/27/14:  49lbs; 12/25/14  49lbs; 01/08/15  met 60lbs   OT LONG TERM GOAL #5   Title Pt will be able to retrieve 2lb object  from overhead shelf with LUE.   Time 8   Period Weeks   Status On-going  01/08/15:  unable to perform with 1lb without compensation (revised goal from 4lbs to 2lbs)   OT LONG  TERM GOAL #6   Title added 01/08/15:  Pt will demo at least 140* L shoulder flexion for functional reaching.   Status Achieved  02/06/15               Plan - 02/06/15 1955    Clinical Impression Statement Pt demo improved bilateral shoulder ROM and is progressing slowly.   Plan check remaining goals and renew for 1x week for 4weeks   OT Home Exercise Plan Education issued: coordination 10/12/14, cane HEP and yellow band (bicep/triceps); 12/25/14 updated HEP    Consulted and Agree with Plan of Care Patient;Family member/caregiver   Family Member Consulted father        Problem List Patient Active Problem List   Diagnosis Date Noted  . Adjustment disorder with depressed mood   . Tetraplegia ([place]) 09/19/2014  . Acute blood loss anemia 09/19/2014  . Cervical spondylosis with myelopathy 09/18/2014  . Radiculopathy of arm 08/28/2014  . Hypogonadism male 09/08/2013  . 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 Cardiomyopathy 04/01/2011  . Wolff-Parkinson-White (WPW) syndrome 04/01/2011    [facility] 02/06/2015, 7:56 PM  [facility] [address] [place], [place], [address] Phone: [phone]   Fax:  [phone]  Name: CLIFFORD BENNINGER MRN: [id number] Date of Birth: 08-24-1970  Vianne Bulls, OTR/L 02/06/2015 7:56 PM

## 2015-02-06 NOTE — Therapy (Signed)
[facility] [address] [place], [place], [address] Phone: [phone]   Fax:  [phone]  Physical Therapy Treatment  Patient Details  Name: Mark Gallagher MRN: [id number] Date of Birth: 11-12-1970 No Data Recorded  Encounter Date: 02/06/2015      PT End of Session - 02/06/15 1153    Visit Number 34   Number of Visits 37   Date for PT Re-Evaluation 02/10/15  modified date due to interruption of PT   Authorization Type [employer]    Authorization - Visit Number 2   Authorization - Number of Visits 30  Annual PT visit limit = 30   PT Start Time 1146   PT Stop Time 1230   PT Time Calculation (min) 44 min   Equipment Utilized During Treatment Gait belt   Activity Tolerance Patient tolerated treatment well   Behavior During Therapy WFL for tasks assessed/performed      Past Medical History  Diagnosis Date  . Thyroid disease   . Cancer ([place])   . 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 Testosterone deficiency   . Knee pain, bilateral   . History of pituitary tumor     Past Surgical History  Procedure Laterality Date  . Cervical disc surgery  2007    Anterior C4/5 vertebrectomy and c3-C6 laminoplasty with plating  . Craniotomy for tumor      pituitary adenoma    There were no vitals filed for this visit.  Visit Diagnosis:  Abnormality of gait  Unsteadiness  Weakness generalized  Decreased coordination      Subjective Assessment - 02/06/15 1148    Subjective Pt returned to work 8 hours yesterday.  "I'm not sure that I can do a full 40 hours this week, but I'm going to try."    Pertinent History * Restrictions: no lifting > 30 lbs; do not work on neck ROM.  PMH: cervical spondylosis with myelopathy; s/p cervical vertebrectomy C4/5 with C3-6 plating 2007 acromegaly s/p pituitary tumor resection (1994); hypogonadism; Wolff-Parkinson-White Syndrome; cardiomyopathy; acute blood loss anemia; adjustment disorder with depressed  mood   Patient Stated Goals "I'd like to be able to walk by myself with no assistance, stand up out of a chair by myself, be able to get up and down stairs without assistance.   Currently in Pain? Yes   Pain Score 3    Pain Location Scapula   Pain Orientation Right;Left   Pain Descriptors / Indicators Aching;Sore   Pain Type Acute pain   Pain Onset 1 to 4 weeks ago   Pain Frequency Constant   Aggravating Factors  exercises, work   Pain Relieving Factors rest             TA: Assessed remaining STG's with gait speed (72m and performance of TUG (both with and without use of cane), see goal section below for details.  Pt continues to require >13.5 secs to complete TUG, therefore demonstrating continued risk for falling.    Gait: Pt ambulated x 150' without use of AD at mod I level over varying indoor surfaces (over thresholds, carpet, etc) in order to better simulate home environment.  Discussed that he could now do this at home for short distances without AD at mod I level (however note that he is walking at home for walking program without cane and mother is providing supervision, continued to recommend this due to fatigue and safety concerns.)  NMR: Ended session with performing obstacle course negotiating around hoola hoops  in figure 8 pattern and stepping over small orange barriers to increase balance challenge and further address negotiation in community and work.  Pt requires S to min/guard when negotiating around hoola hoops and min A for negotiating over barriers with cues for sequencing and technique for max safety.   Self Care: Had lengthy discussion regarding checking current goals, continuing them if not met, revising and making new goals to work towards.  Also discussed making more goals community and work related as he has returned to work and is better able to identify barriers to mobility at work.  Pt and father verbalized understanding.  Also educated on having S for  walking program in home without use of cane for safety.  Pt verbalized understanding.                     PT Education - 02/06/15 1152    Education provided Yes   Education Details TUG, gait speed and walking at home short distances without cane at mod I level   Person(s) Educated Patient   Methods Explanation   Comprehension Verbalized understanding          PT Short Term Goals - 01/04/15 1507    PT SHORT TERM GOAL #1   Title Pt will perform initial HEP with mod I using paper handout to indicate safe HEP compliance, maximize functional gains made in PT. Target date: 11/06/14   Baseline Met 9/22.   Status Achieved   PT SHORT TERM GOAL #2   Title Perform 6 Minute Walk Test to assess/address functional endurance. Target date: 11/06/14   Baseline 9/20: 556' with RW   Status Achieved   PT SHORT TERM GOAL #3   Title Perform TUG to assess/address efficiency of functional mobility. Target date: 11/06/14   Baseline 9/20: TUG time = 28.3 seconds with RW   Status Achieved   PT SHORT TERM GOAL #4   Title Pt will ambulate 200' over level, indoor surfaces with mod I using LRAD to indicate increased safety/indepedence with household ambulation. Target date: 11/06/14   Baseline Met 10/12 with bariatric RW.   Status Achieved   PT SHORT TERM GOAL #5   Title Pt will negotiate 14 stairs with B rails and min A of single person to indicate increased independence accessing second floor of home. Target date: 11/06/14   Baseline 10/10: required min guard of PT for stairs x16   Status Achieved   PT SHORT TERM GOAL #6   Title Complete Berg Balance Scale and improve score by 4 points from baseline to progress toward significant improvement in functional standing balance. Target date: 01/02/15   Baseline 11/18: Berg score = 34/56   Status Achieved   PT SHORT TERM GOAL #7   Title Pt will improve gait velocity from 1.94 ft/sec to 2.24 ft/sec to indicate increased efficiency of ambulation.  Target date: 01/02/15   Baseline 11/15: 1.94 ft/sec using [facility]      12/15: 2.54 ft/sec with [facility]   Status Achieved   PT SHORT TERM GOAL #8   Title Pt will ambulate 5' over level, indoor surfaces without AD requiring supervision to progress toward independence with household mobility. Target date: 01/02/15   Baseline 12/15: ambulated x115' without AD with supervision   Status Achieved           PT Long Term Goals - 02/06/15 1154    PT LONG TERM GOAL #1   Title Pt will improve 6 Minute Walk distance by  150' from baseline to indicate significant improvement in functional endurance. Target date: 12/04/14   Baseline 9/20: baseline 6MWT distance = 556' with RW;  11/15: 6MWT distance = 910 ' with RW   Status Achieved   PT LONG TERM GOAL #2   Title Pt will improve TUG score by 10.8 seconds from baseline to indicate significant improvement in efficiency of functional mobility.  Target date: 12/04/14   Baseline 9/20: baseline TUG = 28.4 sec with RW;  11/15: TUG = 17.67 with RW   Status Achieved   PT LONG TERM GOAL #3   Title Pt will ambulate 500' over unlevel, paved surfaces with mod I using LRAD to indicate increased stability/independence with community mobility. Target date: 12/04/14   Baseline Met 11/10 with [facility].   Status Achieved   PT LONG TERM GOAL #4   Title Pt will negotiate standard ramp and curb step with mod I using LRAD to indicate increased safety/indepedence traversing community obstacles. Target date: 12/04/14   Baseline Met 11/10 with [facility].   Status Achieved   PT LONG TERM GOAL #5   Title Pt will negotiate 14 stairs with 2 rails and mod I to indicate increased independence accessing second floor of home.  Target date: 12/04/14   Baseline Met 11/27/14.   Status Achieved   PT LONG TERM GOAL #6   Title Pt will improve Berg score by 8 points from baseline to indicate significant improvement in functional standing balance. Target date: 02/06/15   Baseline 11/18: baseline Berg  score = 34/56; 1/12: Berg score = 43/56   Status Achieved   PT LONG TERM GOAL #7   Title Pt will improve gait velocity to > 2.62 ft/sec to indicate status of community ambulatory.  Target date: 02/06/15  Modified target date due to interruption of care   Baseline 2.78 ft/sec w/ cane on 02/06/15   Status Partially Met   PT LONG TERM GOAL #8   Title Pt will perform TUG in < 13.5 seconds to indicate decreased fall risk. Target date: 02/06/15  Modified target date due to interruption of care   Baseline 17.13 with cane and 16.78 secs without cane 02/06/15   Status Partially Met   PT LONG TERM GOAL  #9   TITLE Pt will ambulate 86' over level, indoor surfaces without AD with mod I to indicate increased stability/independence with household ambulation.  Target date: 02/06/15  Modified target date due to interruption of care   Baseline met 02/06/15    Status Achieved               Plan - 02/06/15 1153    Clinical Impression Statement Skilled session focused on assessment of remaining LTG's.  Note that he has met 2/3 (did not meet TUG goal without or without use of cane) and therefore indicative of continued fall risk.  Provided pt with education on this as well as providing okay for him to ambulate short distances at home without cane at mod I level.  Pt and father verbalized understanding.   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  Tolerated well but with min/guard to min A without cane.  Disucssion of addition of goals to continue to progress towards.     Pt will benefit from skilled therapeutic intervention in order to improve on the following deficits Abnormal gait;Decreased activity tolerance;Decreased balance;Decreased endurance;Decreased coordination;Decreased mobility;Decreased strength;Impaired sensation;Pain   Rehab Potential Good   PT Frequency  2x / week   PT Duration 8 weeks   PT  Treatment/Interventions Vestibular;ADLs/Self Care Home Management;DME Instruction;Balance training;Therapeutic exercise;Therapeutic activities;Functional mobility training;Stair training;Gait training;Neuromuscular re-education;Patient/family education;Electrical Stimulation   PT Next Visit Plan * Continue checking LTG's; Benjie Karvonen will recert Thurs Benjie Karvonen what about new goals; amb at work with [facility] AD? amb while carrying object for work (<30lbs), negotation over uneven surfaces/tight spaces to simulate work), . Continue gait training without AD; hip strengthening; balance training with focus on single limb stance stability. Consider other bracing options (pending insurance coverage) and mompressive garment at LLE for increased sensory input, decrease L toe catch.   Consulted and Agree with Plan of Care Patient;Family member/caregiver   Family Member Consulted father        Problem List Patient Active Problem List   Diagnosis Date Noted  . Adjustment disorder with depressed mood   . Tetraplegia ([place]) 09/19/2014  . Acute blood loss anemia 09/19/2014  . Cervical spondylosis with myelopathy 09/18/2014  . Radiculopathy of arm 08/28/2014  . Hypogonadism male 09/08/2013  . 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 Cardiomyopathy 04/01/2011  . Wolff-Parkinson-White (WPW) syndrome 04/01/2011    Cameron Sprang, PT, MPT [facility] [address] [place], [place], [address] Phone: [phone]   Fax:  [phone] 02/06/2015, 1:00 PM  Name: ROARKE MARCIANO MRN: [id number] Date of Birth: 28-Aug-1970

## 2015-02-08 ENCOUNTER — Ambulatory Visit: Payer: Managed Care, Other (non HMO) | Admitting: Occupational Therapy

## 2015-02-08 ENCOUNTER — Ambulatory Visit: Payer: Managed Care, Other (non HMO) | Admitting: Physical Therapy

## 2015-02-08 DIAGNOSIS — R269 Unspecified abnormalities of gait and mobility: Secondary | ICD-10-CM | POA: Diagnosis not present

## 2015-02-08 DIAGNOSIS — R279 Unspecified lack of coordination: Secondary | ICD-10-CM

## 2015-02-08 DIAGNOSIS — R278 Other lack of coordination: Secondary | ICD-10-CM

## 2015-02-08 DIAGNOSIS — R531 Weakness: Secondary | ICD-10-CM

## 2015-02-08 DIAGNOSIS — M6281 Muscle weakness (generalized): Secondary | ICD-10-CM

## 2015-02-08 DIAGNOSIS — M25612 Stiffness of left shoulder, not elsewhere classified: Secondary | ICD-10-CM

## 2015-02-08 DIAGNOSIS — M21372 Foot drop, left foot: Secondary | ICD-10-CM

## 2015-02-09 NOTE — Therapy (Signed)
[facility] [address] [place], [place], [address] Phone: [phone]   Fax:  [phone]  Physical Therapy Treatment  Patient Details  Name: Mark Gallagher MRN: [id number] Date of Birth: [birth year] No Data Recorded  Encounter Date: 02/08/2015      PT End of Session - 02/08/15 1241    Visit Number 35   Number of Visits 45   Date for PT Re-Evaluation 04/09/2015   Authorization Type Aet[employer]    Authorization - Visit Number 3   Authorization - Number of Visits 30  Annual PT visit limit = 30   PT Start Time 1015   PT Stop Time 1107   PT Time Calculation (min) 52 min   Equipment Utilized During Treatment Gait belt   Activity Tolerance Patient tolerated treatment well   Behavior During Therapy [facility] for tasks assessed/performed      Past Medical History  Diagnosis Date  . Thyroid disease   . Cancer ([place])   . 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 Testosterone deficiency   . Knee pain, bilateral   . History of pituitary tumor     Past Surgical History  Procedure Laterality Date  . Cervical disc surgery  2007    Anterior C4/5 vertebrectomy and c3-C6 laminoplasty with plating  . Craniotomy for tumor      pituitary adenoma    There were no vitals filed for this visit.  Visit Diagnosis:  Abnormality of gait  Foot drop, left  Muscle weakness (generalized)      Subjective Assessment - 02/08/15 1015    Subjective Pt arrived today with no significant changes since last session. Denies falls. Says he is back to working a full 8 hour day at work and it is "wearing him out."   Pertinent History * Restrictions: no lifting > 30 lbs; do not work on neck ROM.  PMH: cervical spondylosis with myelopathy; s/p cervical vertebrectomy C4/5 with C3-6 plating 2007 acromegaly s/p pituitary tumor resection (1994); hypogonadism; Wolff-Parkinson-White Syndrome; cardiomyopathy; acute blood loss anemia; adjustment disorder with depressed mood    Patient Stated Goals "I'd like to be able to walk by myself with no assistance, stand up out of a chair by myself, be able to get up and down stairs without assistance.   Currently in Pain? Yes   Pain Score 3    Pain Location Scapula   Pain Orientation Left;Right   Pain Descriptors / Indicators Aching;Sore   Pain Type Acute pain   Pain Onset 1 to 4 weeks ago   Pain Frequency Constant   Aggravating Factors  exercises, work   Pain Relieving Factors rest   Multiple Pain Sites No                         [facility] Adult PT Treatment/Exercise - 02/08/15 1153    Ambulation/Gait   Ambulation/Gait Yes   Ambulation/Gait Assistance 5: Supervision;4: Min guard;4: Min assist   Ambulation/Gait Assistance Details 115' with no AD min guard for pt safety. 20' down and back x8 with SPC stepping on and over red mat to simulate surface change and inc surface height of mats at work min guard to min A for pt safety and due to L toe catch. 4 trials performed normally, last 4 trials done with addition of cognitive task (serial 7s and naming fruits). Gait 20' down and back x4 with SPC starting with pt picking up notepad from desk, ambulating 20' and placing object  down on stool for time to simulate object manipulation and dual tasking while at work. Required min A and min guard for pt safety. Trial times = first trial untimed, trial 2 = 9.38 sec, trial 3 = 7.34 sec with LOB due to L toe drag, trial 4 = 8.04 sec with no overt LOB following PT cues for safe task completion.   Ambulation Distance (Feet) 355 Feet   Assistive device Straight cane   Gait Pattern Step-through pattern;Decreased stance time - left;Decreased weight shift to left;Lateral hip instability;Poor foot clearance - left   Ambulation Surface Level;Unlevel;Other (comment)  Red mat   Self-Care   Self-Care Other Self-Care Comments   Other Self-Care Comments  Pt having difficulty with knee brace coming undone and falling down during gait,  proxmial strap on AFO falling off. Knee brace was adjusted for pt by PT and pt educated to let patella rest in open portion of sleeve. There was excessive build up of lint in velcro on AFO, so an alternative strap with adhesive was placed on the AFO and fitted accordingly. The pt reported no further issues during today's session after fitting.   Neuro Re-ed    Neuro Re-ed Details  Performed step up and back with L LE on 2' step with Airex on top to increase height and add compliant surface to improve L leg stability in single limb stance. Performed in parallel bars with 1 UE assist for pt safety. pt required min A for safety. Performed L toe taps onto 8" step to improve L hip/knee flexion, ankle dorsiflexion during L swing for toe clearance. Performed in parallel bars with min guard for pt safety. Pt required verbal cues for hip/knee flexion vs circumduction.                PT Education - 02/08/15 1239    Education provided Yes   Education Details Continue HEP, balance and clam exercises. Discussed progressing exercises at next session to decrease time it takes to complete HEP.   Person(s) Educated Patient;Parent(s)   Methods Explanation   Comprehension Verbalized understanding          PT Short Term Goals - 02/08/15 1317    PT SHORT TERM GOAL #1   Title Pt will perform initial HEP with mod I using paper handout to indicate safe HEP compliance, maximize functional gains made in PT. Target date: 11/06/14   Baseline Met 9/22.   Status Achieved   PT SHORT TERM GOAL #2   Title Perform 6 Minute Walk Test to assess/address functional endurance. Target date: 11/06/14   Baseline 9/20: 556' with RW   Status Achieved   PT SHORT TERM GOAL #3   Title Perform TUG to assess/address efficiency of functional mobility. Target date: 11/06/14   Baseline 9/20: TUG time = 28.3 seconds with RW   Status Achieved   PT SHORT TERM GOAL #4   Title Pt will ambulate 200' over level, indoor surfaces with  mod I using LRAD to indicate increased safety/indepedence with household ambulation. Target date: 11/06/14   Baseline Met 10/12 with bariatric RW.   Status Achieved   PT SHORT TERM GOAL #5   Title Pt will negotiate 14 stairs with B rails and min A of single person to indicate increased independence accessing second floor of home. Target date: 11/06/14   Baseline 10/10: required min guard of PT for stairs x16   Status Achieved   Additional Short Term Goals   Additional Short Term Goals Yes  PT SHORT TERM GOAL #6   Title Complete Berg Balance Scale and improve score by 4 points from baseline to progress toward significant improvement in functional standing balance. Target date: 01/02/15   Baseline 11/18: Berg score = 34/56   Status Achieved   PT SHORT TERM GOAL #7   Title Pt will improve gait velocity from 1.94 ft/sec to 2.24 ft/sec to indicate increased efficiency of ambulation. Target date: 01/02/15   Baseline 11/15: 1.94 ft/sec using LBQC      12/15: 2.54 ft/sec with [facility]   Status Achieved   PT SHORT TERM GOAL #8   Title Pt will ambulate 26' over level, indoor surfaces without AD requiring supervision to progress toward independence with household mobility. Target date: 01/02/15   Baseline 12/15: ambulated x115' without AD with supervision   Status Achieved   PT SHORT TERM GOAL #9   TITLE Pt will ambulate 150' over paved, outdoor surfaces without AD requiring supervision to indicate increased independence with community ambulation. Target date: 03/08/15   Time 4   Period Weeks   Status New   PT SHORT TERM GOAL #10   TITLE TUG manual time will be within 40% of TUG normal time to indicate increased efficiency of mobility while dual-tasking. Target date: 03/08/15   Time 4   Period Weeks   Status New   PT SHORT TERM GOAL #11   TITLE Pt will ambulate x100' over indoor solid/compliant surfaces and around 10 obstacles with mod I using SPC to indicate increased safety using cane in work  environment. Target date: 03/08/15   Time 4   Period Weeks   Status New           PT Long Term Goals - 02/08/15 1332    PT LONG TERM GOAL #1   Title Pt will improve 6 Minute Walk distance by 150' from baseline to indicate significant improvement in functional endurance. Target date: 12/04/14   Baseline 9/20: baseline 6MWT distance = 556' with RW;  11/15: 6MWT distance = 910 ' with RW   Status Achieved   PT LONG TERM GOAL #2   Title Pt will improve TUG score by 10.8 seconds from baseline to indicate significant improvement in efficiency of functional mobility.  Target date: 12/04/14   Baseline 9/20: baseline TUG = 28.4 sec with RW;  11/15: TUG = 17.67 with RW   Status Achieved   PT LONG TERM GOAL #3   Title Pt will ambulate 500' over unlevel, paved surfaces with mod I using LRAD to indicate increased stability/independence with community mobility. Target date: 12/04/14   Baseline Met 11/10 with LBQC.   Status Achieved   PT LONG TERM GOAL #4   Title Pt will negotiate standard ramp and curb step with mod I using LRAD to indicate increased safety/indepedence traversing community obstacles. Target date: 12/04/14   Baseline Met 11/10 with LBQC.   Status Achieved   PT LONG TERM GOAL #5   Title Pt will negotiate 14 stairs with 2 rails and mod I to indicate increased independence accessing second floor of home.  Target date: 12/04/14   Baseline Met 11/27/14.   Status Achieved   Additional Long Term Goals   Additional Long Term Goals Yes   PT LONG TERM GOAL #6   Title Pt will improve Berg score by 8 points from baseline to indicate significant improvement in functional standing balance. Target date: 02/06/15   Baseline 11/18: baseline Berg score = 34/56; 1/12: Berg score = 43/56  Status Achieved   PT LONG TERM GOAL #7   Title Pt will improve gait velocity to > 2.62 ft/sec to indicate status of community ambulator.  Target date: 02/06/15   Baseline 2.78 ft/sec w/ cane on 02/06/15   Status  Achieved   PT LONG TERM GOAL #8   Title Pt will perform TUG in < 13.5 seconds to indicate decreased fall risk. Modified target date: 04/05/15   Baseline 17.13 with cane and 16.78 secs without cane 02/06/15  Continue as LTG through renewed POC.   Time 8   Period Weeks   Status Partially Met   PT LONG TERM GOAL  #9   TITLE Pt will ambulate 3' over level, indoor surfaces without AD with mod I to indicate increased stability/independence with household ambulation.  Target date: 02/06/15   Baseline met 02/06/15    Status Achieved   PT LONG TERM GOAL  #10   TITLE Pt will ambulate 150' indoors over level and unlevel surfaces of varying heights and compliance with no AD and mod I to demonstrate safety/independence with ambulation during work. Target date: 04/05/15   Time 8   Period Weeks   Status New   PT LONG TERM GOAL  #11   TITLE TUG manual will be within 25% of TUG normal to indicate increased efficiency of mobility while dual-tasking. Target date: 04/05/15   Time 8   Period Weeks   Status New   PT LONG TERM GOAL  #12   TITLE Pt will ambulate 200' over unlevel, paved surfaces with mod I (for increased time, use of orthoses) but without AD to indicate increased independence with community mobility. Target date: 04/05/15   Time 8   Period Weeks   Status New               Plan - 02/08/15 1243    Clinical Impression Statement Today's session focused on assessment of LTG's. Pt has met 7/9 LTG's at this time. The pt has made significant progress towards these goals, but continues to exhibit decreased gait speed, decreased foot clearance during gait, difficulty turning, and continues to be at risk for falls. These impairments restrict the pt from performing job related duties such as carrying objects at work, walking over compliant surfaces, and navigating obstacles which is affecting his quality of life. Therefore, it was recommended that the pt continue PT 1x/wk for 8 weeks to address the  above impairments. The pt verbalized agreement and understanding.   Pt will benefit from skilled therapeutic intervention in order to improve on the following deficits Abnormal gait;Decreased activity tolerance;Decreased balance;Decreased endurance;Decreased coordination;Decreased mobility;Decreased strength;Impaired sensation;Pain   Rehab Potential Good   PT Frequency 1x / week   PT Duration 8 weeks   PT Treatment/Interventions Vestibular;ADLs/Self Care Home Management;DME Instruction;Balance training;Therapeutic exercise;Therapeutic activities;Functional mobility training;Stair training;Gait training;Neuromuscular re-education;Patient/family education;Electrical Stimulation   PT Next Visit Plan Address and progress HEP, ambulate outdoors over various surfaces, turns, ambulation with object manipulation   PT Home Exercise Plan Continue clams and balance exercises   Consulted and Agree with Plan of Care Patient;Family member/caregiver   Family Member Consulted mother        Problem List Patient Active Problem List   Diagnosis Date Noted  . Adjustment disorder with depressed mood   . Tetraplegia ([place]) 09/19/2014  . Acute blood loss anemia 09/19/2014  . Cervical spondylosis with myelopathy 09/18/2014  . Radiculopathy of arm 08/28/2014  . Hypogonadism male 09/08/2013  . 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 Cardiomyopathy  04/01/2011  . Wolff-Parkinson-White (WPW) syndrome 04/01/2011    De Nurse, SPT 02/09/2015, 8:06 AM  [facility] [address] [place], [place], [address] Phone: [phone]   Fax:  [phone]  Name: Mark Gallagher MRN: [id number] Date of Birth: [birth year]

## 2015-02-09 NOTE — Therapy (Signed)
[facility] [address] [place], [place], [address] Phone: [phone]   Fax:  [phone]  Occupational Therapy Treatment  Patient Details  Name: Mark Gallagher MRN: [id number] Date of Birth: 1970-05-06 No Data Recorded  Encounter Date: 02/08/2015      OT End of Session - 02/08/15 1122    Visit Number 31   Number of Visits 35  31+4=35   Date for OT Re-Evaluation 03/09/15   Authorization Type Aetna, 60 visit limit combined   Authorization Time Period renewal completed 02/08/15, next week will be week 1/4   Authorization - Visit Number 3   Authorization - Number of Visits 30   OT Start Time 1110   OT Stop Time 1150   OT Time Calculation (min) 40 min   Activity Tolerance Patient tolerated treatment well   Behavior During Therapy [facility] for tasks assessed/performed      Past Medical History  Diagnosis Date  . Thyroid disease   . Cancer ([place])   . 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 Testosterone deficiency   . Knee pain, bilateral   . History of pituitary tumor     Past Surgical History  Procedure Laterality Date  . Cervical disc surgery  2007    Anterior C4/5 vertebrectomy and c3-C6 laminoplasty with plating  . Craniotomy for tumor      pituitary adenoma    There were no vitals filed for this visit.  Visit Diagnosis:  Weakness generalized  Decreased coordination  Shoulder joint stiffness, left      Subjective Assessment - 02/08/15 1120    Patient is accompained by: Family member   Pertinent History C3-T2 fusion 09/10/14 with cardiac surgery after surgery, hx of previous C1-C6 fusion (2007)   Limitations cleared for overhead reaching; lifting restriction of 30lbs, cleared for shoulder strengthening, **Do not work on neck motion   Patient Stated Goals return to work   Currently in Pain? Yes   Pain Score 3    Pain Location --  between shoulder blades   Pain Descriptors / Indicators Aching;Sore   Pain Type Acute  pain   Pain Frequency Constant   Aggravating Factors  exercises, work   Pain Relieving Factors rest, stretching            [facility] OT Assessment - 02/08/15 0001    Coordination   Left 9 Hole Peg Test 38.91, 33.19sec                  OT Treatments/Exercises (OP) - 02/08/15 0001    ADLs   ADL Comments checked remaing goals and discussed progress in prep for renewal--see goals section   Shoulder Exercises: Supine   Shoulder Flexion Weight (lbs) AAROM with cane    ABduction AAROM;10 reps   Other Supine Exercises shoulder flex with 2lb wt.  x 10   Other Supine Exercises In sidelying, ER with 1lb wt.   Shoulder Exercises: Seated   Flexion Strengthening;Left;Weights;10 reps  with 2lb wt    Shoulder Exercises: Prone   Other Prone Exercises In prone over ball, scapular retraction with 2lb wt with elbow bent, then shoulder ext with 1lb wt, and abduction with 1lb wt x10 each   Other Prone Exercises In modified quadraped (standing with hands on mat).  Foward/back wt. shifts x10 for incr scapular stabilization followed by alternating UE lifts with trunk rotation as able (with mod difficulty).  OT Short Term Goals - 02/08/15 1148    OT SHORT TERM GOAL #1   Title Pt will verbalize understanding of coordination/hand strength HEP--check STGs 11/08/14   Time 4   Period Weeks   Status Achieved   OT SHORT TERM GOAL #2   Title Pt will perfom BADLs mod I.   Baseline min A bathing   Time 4   Period Weeks   Status Achieved  11/06/14  supervision for shower transfer, min A for R brace/neck brace;  11/27/14 met   OT SHORT TERM GOAL #3   Title Pt will improve L hand coordination for ADLs as shown by completing 9-hole peg test in less than 55sec.   Baseline 77.34sec   Time 4   Period Weeks   Status Achieved  11/06/14:  56.94sec;  11/27/14  45.69sec   OT SHORT TERM GOAL #4   Title Pt will demo at least 40lbs L grip strength for ADLs/opening containers.    Baseline 30lbs   Time 4   Period Weeks   Status Achieved  11/06/14:  45lbs   OT SHORT TERM GOAL #5   Title Pt will improve L hand coordination for ADLs as shown by completing 9-hole peg test in less than 40sec.--check 12/29/14   Baseline 45.56 secs, 34.12 secs   Time 4   Period Weeks   Status Achieved  02/08/15 38.91sec, 33.19sec   OT SHORT TERM GOAL #6   Title Pt will be able to perform overhead reaching with LUE for at least 33mn without rest break for incr activity tolerance.--check 12/29/14   Time 4   Period Weeks   Status Achieved   OT SHORT TERM GOAL #7   Title Pt will demo at least 55lbs L grip strength for ADLs/opening containers.--check 12/29/14   Time 4   Period Weeks   Status Achieved  12/25/14:  49lbs; 01/08/15  60lbs           OT Long Term Goals - 02/08/15 1128    OT LONG TERM GOAL #1   Title Pt will be independent with proximal UE HEP when cleared from cervical/lifting precautions.--check updated LTGs 01/29/15   Status Achieved  11/27/14 pt not yet cleared from cervical precautions, met 02/01/15   OT LONG TERM GOAL #2   Title Pt will perform simple meal prep/home maintenance tasks mod I.   Status Partially Met  not fully met 11/30/14;  02/06/15  only not performing cooking due to fatigue with HEP and return to work (parents performing cooking) but is performing home maintenance tasks   OT LONG TERM GOAL #3   Title Pt will improve L hand coordination for ADLs as shown by completing 9-hole peg test in less than 37sec.   Baseline 47.88secs  11/21/14; 11/27/14  45.69sec  (revised goal to <37 instead of less than 45sec for renewal)   Status Achieved  02/08/15;  38.91, 33.19sec   OT LONG TERM GOAL #4   Title Pt will demo at least 65lbs L grip strength for ADLs/opening containers.   Baseline revised to 60 for renewal 11/30/14; upgraded to 65lbs 01/08/15 due to goal met   Status Achieved  11/27/14:  49lbs; 12/25/14  49lbs; 01/08/15  met 60lbs   OT LONG TERM GOAL #5   Title  Pt will be able to retrieve 2lb object from overhead shelf with LUE.   Time 8   Period Weeks   Status Achieved  01/08/15:  unable to perform with 1lb without compensation (revised goal  from 4lbs to 2lbs);  02/08/15  met at approx this level   Long Term Additional Goals   Additional Long Term Goals Yes   OT LONG TERM GOAL #6   Title added 01/08/15:  Pt will demo at least 140* L shoulder flexion for functional reaching.   Time 4   Status Achieved  02/06/15   OT LONG TERM GOAL #7   Title Pt will be independent with final HEP--check LTGs 03/09/15   Time 4   Status New   OT LONG TERM GOAL #8   Title Pt will demo at least 150 degrees L shoulder flex for functional reaching.   Baseline 4   Status New   OT LONG TERM GOAL  #9   Baseline Pt will be able to retrieve 3lb object from overhead shelf with LUE without compensation/safely.   Time 4   Period Weeks   Status New               Plan - 02/08/15 1324    Clinical Impression Statement Pt demo good improvement with all previous goals met (except partially met LTG#2, but only due to pt reports of fatigue with return to work and HEP--mother performing cooking due to convience).  Pt continues to demo decr L shoulder weakness/stiffness and would benefit from continued occupational thearpy to progress HEP and continue with LUE strength/ROM.   Pt will benefit from skilled therapeutic intervention in order to improve on the following deficits (Retired) Decreased activity tolerance;Decreased balance;Decreased mobility;Decreased strength;Pain;Impaired UE functional use;Decreased knowledge of use of DME;Decreased coordination   Rehab Potential Good   OT Frequency 1x / week   OT Duration 4 weeks  starting next week   OT Treatment/Interventions Passive range of motion;Moist Heat;Cryotherapy;DME and/or AE instruction;Therapeutic activities;Energy conservation;Therapeutic exercises;Neuromuscular education;Manual Therapy;Self-care/ADL  training;Therapeutic exercise;Functional Mobility Training;Patient/family education   Plan renewal for 1x week for 4 weeks for continued strengthening and updating HEP   OT Home Exercise Plan Education issued: coordination 10/12/14, cane HEP and yellow band (bicep/triceps); 12/25/14 updated HEP    Consulted and Agree with Plan of Care Patient;Family member/caregiver   Family Member Consulted mother        Problem List Patient Active Problem List   Diagnosis Date Noted  . Adjustment disorder with depressed mood   . Tetraplegia ([place]) 09/19/2014  . Acute blood loss anemia 09/19/2014  . Cervical spondylosis with myelopathy 09/18/2014  . Radiculopathy of arm 08/28/2014  . Hypogonadism male 09/08/2013  . 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 Cardiomyopathy 04/01/2011  . Wolff-Parkinson-White (WPW) syndrome 04/01/2011    [facility] 02/09/2015, 12:50 AM  [facility] [address] [place], [place], [address] Phone: [phone]   Fax:  [phone]  Name: ISHAM SMITHERMAN MRN: [id number] Date of Birth: 1970-02-09  Vianne Bulls, OTR/L 02/09/2015 12:50 AM

## 2015-02-12 ENCOUNTER — Ambulatory Visit: Payer: Managed Care, Other (non HMO) | Admitting: Rehabilitation

## 2015-02-12 ENCOUNTER — Encounter: Payer: Managed Care, Other (non HMO) | Admitting: Occupational Therapy

## 2015-02-14 ENCOUNTER — Ambulatory Visit (INDEPENDENT_AMBULATORY_CARE_PROVIDER_SITE_OTHER): Payer: Managed Care, Other (non HMO) | Admitting: *Deleted

## 2015-02-14 DIAGNOSIS — E291 Testicular hypofunction: Secondary | ICD-10-CM | POA: Diagnosis not present

## 2015-02-14 NOTE — Progress Notes (Signed)
   Subjective:    Patient ID: Mark Gallagher, male    DOB: 03-02-1970, 45 y.o.   MRN: [id number]  HPI pt here for Testosterone Cypionate 200mg /ml injection only. Pt receives 0.5 mg every 14 days.    Review of Systems     Objective:   Physical Exam        Assessment & Plan:

## 2015-02-14 NOTE — Progress Notes (Signed)
   Subjective:    Patient ID: Mark Gallagher, male    DOB: [birth year], 45 y.o.   MRN: [id number]  HPI pt here for Testosterone Cypionate 200 mg/ml injection only. (L) [id number], ex. 05/2016. [place] [id number]. Pt receives 0.5 mg every 14 days.    Review of Systems     Objective:   Physical Exam        Assessment & Plan:

## 2015-02-15 ENCOUNTER — Ambulatory Visit: Payer: Managed Care, Other (non HMO) | Admitting: Occupational Therapy

## 2015-02-15 ENCOUNTER — Ambulatory Visit: Payer: Managed Care, Other (non HMO) | Admitting: Physical Therapy

## 2015-02-15 DIAGNOSIS — R279 Unspecified lack of coordination: Secondary | ICD-10-CM

## 2015-02-15 DIAGNOSIS — R269 Unspecified abnormalities of gait and mobility: Secondary | ICD-10-CM | POA: Diagnosis not present

## 2015-02-15 DIAGNOSIS — R531 Weakness: Secondary | ICD-10-CM

## 2015-02-15 DIAGNOSIS — M25612 Stiffness of left shoulder, not elsewhere classified: Secondary | ICD-10-CM

## 2015-02-15 DIAGNOSIS — M21372 Foot drop, left foot: Secondary | ICD-10-CM

## 2015-02-15 DIAGNOSIS — R278 Other lack of coordination: Secondary | ICD-10-CM

## 2015-02-15 DIAGNOSIS — M6281 Muscle weakness (generalized): Secondary | ICD-10-CM

## 2015-02-15 NOTE — Therapy (Signed)
[facility] [address] [place], [place], [address] Phone: [phone]   Fax:  [phone]  Occupational Therapy Treatment  Patient Details  Name: Mark Gallagher MRN: [id number] Date of Birth: 08-Jan-1971 No Data Recorded  Encounter Date: 02/15/2015      OT End of Session - 02/15/15 1100    Visit Number 32   Number of Visits 35  [phone]=35   Date for OT Re-Evaluation 03/09/15   Authorization Type [employer], 60 visit limit combined   Authorization Time Period renewal completed 02/08/15, week 1/4   Authorization - Visit Number 4   Authorization - Number of Visits 30   OT Start Time 1102   OT Stop Time 1145   OT Time Calculation (min) 43 min   Activity Tolerance Patient tolerated treatment well   Behavior During Therapy [facility] for tasks assessed/performed      Past Medical History  Diagnosis Date  . Thyroid disease   . Cancer ([place])   . 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 Testosterone deficiency   . Knee pain, bilateral   . History of pituitary tumor     Past Surgical History  Procedure Laterality Date  . Cervical disc surgery  2007    Anterior C4/5 vertebrectomy and c3-C6 laminoplasty with plating  . Craniotomy for tumor      pituitary adenoma    There were no vitals filed for this visit.  Visit Diagnosis:  Weakness generalized  Decreased coordination  Shoulder joint stiffness, left      Subjective Assessment - 02/15/15 1104    Subjective  Pt reports that he has been doing real well   Patient is accompained by: Family member   Pertinent History C3-T2 fusion 09/10/14 with cardiac surgery after surgery, hx of previous C1-C6 fusion (2007)   Limitations cleared for overhead reaching; lifting restriction of 30lbs, cleared for shoulder strengthening, **Do not work on neck motion   Patient Stated Goals return to work   Currently in Pain? Yes   Pain Score 3    Pain Location --  between shoulder blades   Pain Orientation  Left;Right;Medial   Pain Descriptors / Indicators Aching;Sore   Pain Type Acute pain   Aggravating Factors  exercise, work   Pain Relieving Factors rest, stretching                       OT Treatments/Exercises (OP) - 02/15/15 0001    Shoulder Exercises: Supine   Shoulder Flexion Weight (lbs) AAROM with cane    ABduction AAROM;10 reps  with cane   Other Supine Exercises ER self stretch BUEs x10.  shoulder flex with 3.3lb ball x10.   Shoulder Exercises: Seated   Flexion Strengthening;Weights;10 reps;Both  with 3.3.lb ball   Other Seated Exercises Arm bike x36mn level 7 (forward/backwards) without rest for conditioning.   Shoulder Exercises: Prone   Other Prone Exercises In prone over ball, scapular retraction with 2lb wt with elbow bent, then shoulder ext with 1lb wt, and abduction with 1lb wt x10 each   Other Prone Exercises In modified quadraped (standing with hands on mat).  Foward/back wt. shifts x10 for incr scapular stabilization.   Shoulder Exercises: Standing   Other Standing Exercises Wall push ups x10 for incr scapular stability/strength                OT Education - 02/15/15 1315    Education Details Updated theraband HEP to red- Pt returned demo each x10  Person(s) Educated Patient;Parent(s)   Methods Explanation;Demonstration   Comprehension Verbalized understanding;Returned demonstration          OT Short Term Goals - 02/08/15 1148    OT SHORT TERM GOAL #1   Title Pt will verbalize understanding of coordination/hand strength HEP--check STGs 11/08/14   Time 4   Period Weeks   Status Achieved   OT SHORT TERM GOAL #2   Title Pt will perfom BADLs mod I.   Baseline min A bathing   Time 4   Period Weeks   Status Achieved  11/06/14  supervision for shower transfer, min A for R brace/neck brace;  11/27/14 met   OT SHORT TERM GOAL #3   Title Pt will improve L hand coordination for ADLs as shown by completing 9-hole peg test in less than  55sec.   Baseline 77.34sec   Time 4   Period Weeks   Status Achieved  11/06/14:  56.94sec;  11/27/14  45.69sec   OT SHORT TERM GOAL #4   Title Pt will demo at least 40lbs L grip strength for ADLs/opening containers.   Baseline 30lbs   Time 4   Period Weeks   Status Achieved  11/06/14:  45lbs   OT SHORT TERM GOAL #5   Title Pt will improve L hand coordination for ADLs as shown by completing 9-hole peg test in less than 40sec.--check 12/29/14   Baseline 45.56 secs, 34.12 secs   Time 4   Period Weeks   Status Achieved  02/08/15 38.91sec, 33.19sec   OT SHORT TERM GOAL #6   Title Pt will be able to perform overhead reaching with LUE for at least 51mn without rest break for incr activity tolerance.--check 12/29/14   Time 4   Period Weeks   Status Achieved   OT SHORT TERM GOAL #7   Title Pt will demo at least 55lbs L grip strength for ADLs/opening containers.--check 12/29/14   Time 4   Period Weeks   Status Achieved  12/25/14:  49lbs; 01/08/15  60lbs           OT Long Term Goals - 02/08/15 1128    OT LONG TERM GOAL #1   Title Pt will be independent with proximal UE HEP when cleared from cervical/lifting precautions.--check updated LTGs 01/29/15   Status Achieved  11/27/14 pt not yet cleared from cervical precautions, met 02/01/15   OT LONG TERM GOAL #2   Title Pt will perform simple meal prep/home maintenance tasks mod I.   Status Partially Met  not fully met 11/30/14;  02/06/15  only not performing cooking due to fatigue with HEP and return to work (parents performing cooking) but is performing home maintenance tasks   OT LONG TERM GOAL #3   Title Pt will improve L hand coordination for ADLs as shown by completing 9-hole peg test in less than 37sec.   Baseline 47.88secs  11/21/14; 11/27/14  45.69sec  (revised goal to <37 instead of less than 45sec for renewal)   Status Achieved  02/08/15;  38.91, 33.19sec   OT LONG TERM GOAL #4   Title Pt will demo at least 65lbs L grip strength for  ADLs/opening containers.   Baseline revised to 60 for renewal 11/30/14; upgraded to 65lbs 01/08/15 due to goal met   Status Achieved  11/27/14:  49lbs; 12/25/14  49lbs; 01/08/15  met 60lbs   OT LONG TERM GOAL #5   Title Pt will be able to retrieve 2lb object from overhead shelf with LUE.  Time 8   Period Weeks   Status Achieved  01/08/15:  unable to perform with 1lb without compensation (revised goal from 4lbs to 2lbs);  02/08/15  met at approx this level   Long Term Additional Goals   Additional Long Term Goals Yes   OT LONG TERM GOAL #6   Title added 01/08/15:  Pt will demo at least 140* L shoulder flexion for functional reaching.   Time 4   Status Achieved  02/06/15   OT LONG TERM GOAL #7   Title Pt will be independent with final HEP--check LTGs 03/09/15   Time 4   Status New   OT LONG TERM GOAL #8   Title Pt will demo at least 150 degrees L shoulder flex for functional reaching.   Baseline 4   Status New   OT LONG TERM GOAL  #9   Baseline Pt will be able to retrieve 3lb object from overhead shelf with LUE without compensation/safely.   Time 4   Period Weeks   Status New               Plan - 02/15/15 1145    Clinical Impression Statement Pt is progressing well and is able to tolerate progressive strengthening well.    Plan review updated HEP   OT Home Exercise Plan Education issued: coordination 10/12/14, cane HEP and yellow band (bicep/triceps); 12/25/14 updated HEP; updated HEP to red band 02/15/15   Consulted and Agree with Plan of Care Patient;Family member/caregiver   Family Member Consulted mother        Problem List Patient Active Problem List   Diagnosis Date Noted  . Adjustment disorder with depressed mood   . Tetraplegia ([place]) 09/19/2014  . Acute blood loss anemia 09/19/2014  . Cervical spondylosis with myelopathy 09/18/2014  . Radiculopathy of arm 08/28/2014  . Hypogonadism male 09/08/2013  . 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 Cardiomyopathy 04/01/2011   . Wolff-Parkinson-White (WPW) syndrome 04/01/2011    [facility] 02/15/2015, 1:16 PM  [place] [address] [place], [place], [address] Phone: [phone]   Fax:  [phone]  Name: Mark Gallagher MRN: [id number] Date of Birth: 1970/12/29  Vianne Bulls, OTR/L 02/15/2015 1:16 PM

## 2015-02-15 NOTE — Therapy (Signed)
[place] [address] [place], [place], [address] Phone: [phone]   Fax:  [phone]  Physical Therapy Treatment  Patient Details  Name: Mark Gallagher MRN: [id number] Date of Birth: 1970/08/17 No Data Recorded  Encounter Date: 02/15/2015      PT End of Session - 02/15/15 1333    Visit Number 36   Number of Visits 45   Date for PT Re-Evaluation 04/09/15   Authorization Type Aet[employer]    Authorization - Visit Number 4   Authorization - Number of Visits 30   PT Start Time 1019   PT Stop Time 1100   PT Time Calculation (min) 41 min   Equipment Utilized During Treatment Gait belt   Activity Tolerance Patient tolerated treatment well   Behavior During Therapy [facility] for tasks assessed/performed      Past Medical History  Diagnosis Date  . Thyroid disease   . Cancer ([place])   . 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 Testosterone deficiency   . Knee pain, bilateral   . History of pituitary tumor     Past Surgical History  Procedure Laterality Date  . Cervical disc surgery  2007    Anterior C4/5 vertebrectomy and c3-C6 laminoplasty with plating  . Craniotomy for tumor      pituitary adenoma    There were no vitals filed for this visit.  Visit Diagnosis:  Abnormality of gait  Foot drop, left  Muscle weakness (generalized)      Subjective Assessment - 02/15/15 1319    Subjective Pt reports no significant changes, no falls. Reports he is adjusting to working 8-hour days and doesn't feel quite as worn out now.   Patient is accompained by: Family member  mother   Pertinent History * Restrictions: no lifting > 30 lbs; do not work on neck ROM.  PMH: cervical spondylosis with myelopathy; s/p cervical vertebrectomy C4/5 with C3-6 plating 2007 acromegaly s/p pituitary tumor resection (1994); hypogonadism; Wolff-Parkinson-White Syndrome; cardiomyopathy; acute blood loss anemia; adjustment disorder with depressed mood   Patient  Stated Goals "I'd like to be able to walk by myself with no assistance, stand up out of a chair by myself, be able to get up and down stairs without assistance.   Currently in Pain? Yes   Pain Score 3    Pain Location Other (Comment)  between shoulder blades   Pain Orientation Right;Left;Medial   Pain Descriptors / Indicators Aching;Sore   Pain Type Acute pain   Pain Onset 1 to 4 weeks ago   Pain Frequency Constant   Aggravating Factors  exercise, work, attempting to lie on stomach   Pain Relieving Factors resting; stretching   Multiple Pain Sites No            [facility] PT Assessment - 02/15/15 1322    Strength   Left Ankle Dorsiflexion 4-/5  DF with forefoot supination   Left Ankle Eversion 1/5   Flexibility   Soft Tissue Assessment /Muscle Length yes   Quadriceps Decreased extensibility of L > R rectus femoris, per Elie Goody.                     [facility] Adult PT Treatment/Exercise - 02/15/15 1322    Ambulation/Gait   Ambulation/Gait Yes   Ambulation/Gait Assistance 5: Supervision;4: Min guard;4: Min assist   Ambulation/Gait Assistance Details Cueing for decreased gait velocity, decreased trunk movement, L stance stability.   Ambulation Distance (Feet) 250 Feet   Assistive device  None;Other (Comment)  L AFO, L hinged knee brace   Gait Pattern Step-through pattern;Decreased stance time - left;Decreased weight shift to left;Lateral hip instability;Poor foot clearance - left   Ambulation Surface Level;Indoor   Self-Care   Self-Care Other Self-Care Comments   Other Self-Care Comments  Added additional velcro strap to L AFO to improve fit and maximize effecitveness of brace.   Exercises   Exercises Other Exercises   Other Exercises  Reviewed all prior home exercises (see Pt Instructions), including prone hip extension. Noted pt difficulty getting into prone position as well as increased pain in prone; therefore, removed prone hip extension from HEP. During single leg  bridging, cueing emphasized use of single LE for bridging, as pt maintained heel of contralateral LE on mat table and had significant difficulty with exercise when heel not in contact with mat. Also added corner balance exercise. Added B rectus femoris self-stretch due to [employer] findings. See Pt Instructions for details on all exercises, reps, sets, frequency, and duration.             Balance Exercises - 02/15/15 1324    Balance Exercises: Standing   Standing Eyes Closed Wide (BOA);Foam/compliant surface;2 reps;30 secs  pillows x2 (instructed to perform w/ couch cushion on HEP)   SLS Eyes open;Solid surface;Other reps (comment)  Cueing for no UE support; 3-5 sec on L; 5-7 sec on R           PT Education - 02/15/15 1321    Education provided Yes   Education Details Reviewed HEP and progressed.   Person(s) Educated Patient;Parent(s)   Methods Explanation;Demonstration;Tactile cues;Verbal cues;Handout   Comprehension Verbalized understanding;Returned demonstration          PT Short Term Goals - 02/08/15 1317    PT SHORT TERM GOAL #1   Title Pt will perform initial HEP with mod I using paper handout to indicate safe HEP compliance, maximize functional gains made in PT. Target date: 11/06/14   Baseline Met 9/22.   Status Achieved   PT SHORT TERM GOAL #2   Title Perform 6 Minute Walk Test to assess/address functional endurance. Target date: 11/06/14   Baseline 9/20: 556' with RW   Status Achieved   PT SHORT TERM GOAL #3   Title Perform TUG to assess/address efficiency of functional mobility. Target date: 11/06/14   Baseline 9/20: TUG time = 28.3 seconds with RW   Status Achieved   PT SHORT TERM GOAL #4   Title Pt will ambulate 200' over level, indoor surfaces with mod I using LRAD to indicate increased safety/indepedence with household ambulation. Target date: 11/06/14   Baseline Met 10/12 with bariatric RW.   Status Achieved   PT SHORT TERM GOAL #5   Title Pt will  negotiate 14 stairs with B rails and min A of single person to indicate increased independence accessing second floor of home. Target date: 11/06/14   Baseline 10/10: required min guard of PT for stairs x16   Status Achieved   Additional Short Term Goals   Additional Short Term Goals Yes   PT SHORT TERM GOAL #6   Title Complete Berg Balance Scale and improve score by 4 points from baseline to progress toward significant improvement in functional standing balance. Target date: 01/02/15   Baseline 11/18: Berg score = 34/56   Status Achieved   PT SHORT TERM GOAL #7   Title Pt will improve gait velocity from 1.94 ft/sec to 2.24 ft/sec to indicate increased efficiency of ambulation.  Target date: 01/02/15   Baseline 11/15: 1.94 ft/sec using [facility]      12/15: 2.54 ft/sec with [facility]   Status Achieved   PT SHORT TERM GOAL #8   Title Pt will ambulate 6' over level, indoor surfaces without AD requiring supervision to progress toward independence with household mobility. Target date: 01/02/15   Baseline 12/15: ambulated x115' without AD with supervision   Status Achieved   PT SHORT TERM GOAL #9   TITLE Pt will ambulate 150' over paved, outdoor surfaces without AD requiring supervision to indicate increased independence with community ambulation. Target date: 03/08/15   Time 4   Period Weeks   Status New   PT SHORT TERM GOAL #10   TITLE TUG manual time will be within 40% of TUG normal time to indicate increased efficiency of mobility while dual-tasking. Target date: 03/08/15   Time 4   Period Weeks   Status New   PT SHORT TERM GOAL #11   TITLE Pt will ambulate x100' over indoor solid/compliant surfaces and around 10 obstacles with mod I using SPC to indicate increased safety using cane in work environment. Target date: 03/08/15   Time 4   Period Weeks   Status New           PT Long Term Goals - 02/08/15 1332    PT LONG TERM GOAL #1   Title Pt will improve 6 Minute Walk distance by 150' from  baseline to indicate significant improvement in functional endurance. Target date: 12/04/14   Baseline 9/20: baseline 6MWT distance = 556' with RW;  11/15: 6MWT distance = 910 ' with RW   Status Achieved   PT LONG TERM GOAL #2   Title Pt will improve TUG score by 10.8 seconds from baseline to indicate significant improvement in efficiency of functional mobility.  Target date: 12/04/14   Baseline 9/20: baseline TUG = 28.4 sec with RW;  11/15: TUG = 17.67 with RW   Status Achieved   PT LONG TERM GOAL #3   Title Pt will ambulate 500' over unlevel, paved surfaces with mod I using LRAD to indicate increased stability/independence with community mobility. Target date: 12/04/14   Baseline Met 11/10 with [facility].   Status Achieved   PT LONG TERM GOAL #4   Title Pt will negotiate standard ramp and curb step with mod I using LRAD to indicate increased safety/indepedence traversing community obstacles. Target date: 12/04/14   Baseline Met 11/10 with [facility].   Status Achieved   PT LONG TERM GOAL #5   Title Pt will negotiate 14 stairs with 2 rails and mod I to indicate increased independence accessing second floor of home.  Target date: 12/04/14   Baseline Met 11/27/14.   Status Achieved   Additional Long Term Goals   Additional Long Term Goals Yes   PT LONG TERM GOAL #6   Title Pt will improve Berg score by 8 points from baseline to indicate significant improvement in functional standing balance. Target date: 02/06/15   Baseline 11/18: baseline Berg score = 34/56; 1/12: Berg score = 43/56   Status Achieved   PT LONG TERM GOAL #7   Title Pt will improve gait velocity to > 2.62 ft/sec to indicate status of community ambulator.  Target date: 02/06/15   Baseline 2.78 ft/sec w/ cane on 02/06/15   Status Achieved   PT LONG TERM GOAL #8   Title Pt will perform TUG in < 13.5 seconds to indicate decreased fall risk. Modified target date:  04/05/15   Baseline 17.13 with cane and 16.78 secs without cane 02/06/15   Continue as LTG through renewed POC.   Time 8   Period Weeks   Status Partially Met   PT LONG TERM GOAL  #9   TITLE Pt will ambulate 45' over level, indoor surfaces without AD with mod I to indicate increased stability/independence with household ambulation.  Target date: 02/06/15   Baseline met 02/06/15    Time 8   Period Weeks   Status Achieved   PT LONG TERM GOAL  #10   TITLE Pt will ambulate 150' indoors over level and unlevel surfaces of varying heights and compliance with no AD and mod I to demonstrate safety/independence with ambulation during work. Target date: 04/05/15   Time 8   Period Weeks   Status New   PT LONG TERM GOAL  #11   TITLE TUG manual will be within 25% of TUG normal to indicate increased efficiency of mobility while dual-tasking. Target date: 04/05/15   Time 8   Period Weeks   Status New   PT LONG TERM GOAL  #12   TITLE Pt will ambulate 200' over unlevel, paved surfaces with mod I (for increased time, use of orthoses) but without AD to indicate increased independence with community mobility. Target date: 04/05/15   Time 8   Period Weeks   Status New               Plan - 02/15/15 1334    Clinical Impression Statement Session focused on assessing pt performance of current HEP and progressing HEP, as appropriate. Pt did require cueing for safe/proper technique with single leg bridging; removed prone hip extension due to increased shoulder pain with prone position. Added corner balance exercise without  head turns (per surgeon precautions). Noted no siginficant changes in L ankle DF/eversion strength since last assessed.     Pt will benefit from skilled therapeutic intervention in order to improve on the following deficits Abnormal gait;Decreased activity tolerance;Decreased balance;Decreased endurance;Decreased coordination;Decreased mobility;Decreased strength;Impaired sensation;Pain   Rehab Potential Good   PT Frequency 1x / week   PT Duration 8 weeks   PT  Treatment/Interventions Vestibular;ADLs/Self Care Home Management;DME Instruction;Balance training;Therapeutic exercise;Therapeutic activities;Functional mobility training;Stair training;Gait training;Neuromuscular re-education;Patient/family education;Electrical Stimulation   PT Next Visit Plan Check hip flexor stretch and corner balance exercise. Consider NMES at L peroneals. Hip extensor/abductor strengthening.   Consulted and Agree with Plan of Care Patient;Family member/caregiver   Family Member Consulted mother        Problem List Patient Active Problem List   Diagnosis Date Noted  . Adjustment disorder with depressed mood   . Tetraplegia ([place]) 09/19/2014  . Acute blood loss anemia 09/19/2014  . Cervical spondylosis with myelopathy 09/18/2014  . Radiculopathy of arm 08/28/2014  . Hypogonadism male 09/08/2013  . 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 Cardiomyopathy 04/01/2011  . Wolff-Parkinson-White (WPW) syndrome 04/01/2011    Billie Ruddy, PT, DPT [facility] [address] [place], [place], [address] Phone: [phone]   Fax:  [phone] 02/15/2015, 1:38 PM   Name: Mark Gallagher MRN: [id number] Date of Birth: [birth year]

## 2015-02-15 NOTE — Patient Instructions (Addendum)
   Quads / HF, Supine    Lie near edge of bed, one leg bent, foot flat on bed. Other leg hanging over edge with thigh resting entirely on bed. Bend hanging knee backward keeping thigh in contact with bed. Hold _60__ seconds.  Perform this stretch 2-3 times per day on each leg.  Copyright  VHI. All rights reserved.    Bridging (Single Leg)    Lie on back with feet shoulder width apart and right leg straight on bed/floor. Lift hips toward the ceiling while keeping leg straight. Hold _2__ seconds. Repeat __10__ times. Do _1-2_ sessions per day on each leg.    SINGLE LIMB STANCE    Stand with your back to a corner with a stable chair in front of you. Lift your RIGHT leg; try to stand on LEFT leg with minimal R hand support (1-2 fingers on chair). Try to hold for 5 seconds without losing balance. Your parents will let you know if you're leaning to the LEFT side. Relax. Repeat on opposite leg.  Perform 5 reps on each leg. Do this twice per day.  Hamstring Stretch, Seated (Strap, Two Chairs)    Sit with LEFT leg extended onto facing chair.  Loop strap (sheet or belt) over top of LEFT foot (as pictured). Pull until you feel a gentle stretch in the back of your calf. Make sure your foot alignment is neutral (not turned inward) - you may need to pull the belt/sheet a little more with the left hand than the right hand to achieve this. Hold for 30 seconds, 4 times per day.   ANKLE: Dorsiflexion - Sitting    Sitting, place strap around LEFT foot. First, use your muscles to pull the foot upward (and outward) as far as you can go; then, use the strap to help to pull your foot up the rest of the way. Perform 20 reps, 3 times per day.  Feet Apart (Compliant Surface) Varied Arm Positions - Eyes Closed    Stand on a couch cushion with arms by your side with feet shoulder width apart and arms out. Close eyes and visualize upright position. Hold_30___ seconds.  Repeat _4_ times per  session. Do _2_ sessions per day.  Copyright  VHI. All rights reserved.

## 2015-02-18 ENCOUNTER — Ambulatory Visit (INDEPENDENT_AMBULATORY_CARE_PROVIDER_SITE_OTHER): Payer: Managed Care, Other (non HMO) | Admitting: Internal Medicine

## 2015-02-18 VITALS — BP 120/64 | HR 140 | Temp 99.0°F | Resp 16 | Ht >= 80 in | Wt 242.0 lb

## 2015-02-18 DIAGNOSIS — J069 Acute upper respiratory infection, unspecified: Secondary | ICD-10-CM

## 2015-02-18 DIAGNOSIS — R05 Cough: Secondary | ICD-10-CM | POA: Diagnosis not present

## 2015-02-18 DIAGNOSIS — R070 Pain in throat: Secondary | ICD-10-CM | POA: Diagnosis not present

## 2015-02-18 DIAGNOSIS — R509 Fever, unspecified: Secondary | ICD-10-CM

## 2015-02-18 DIAGNOSIS — R6883 Chills (without fever): Secondary | ICD-10-CM | POA: Diagnosis not present

## 2015-02-18 LAB — POCT CBC
Granulocyte percent: 72.3 %G (ref 37–80)
HCT, POC: 38 % — AB (ref 43.5–53.7)
Hemoglobin: 13.8 g/dL — AB (ref 14.1–18.1)
Lymph, poc: 0.6 (ref 0.6–3.4)
MCH, POC: 33.4 pg — AB (ref 27–31.2)
MCHC: 36.4 g/dL — AB (ref 31.8–35.4)
MCV: 91.6 fL (ref 80–97)
MID (cbc): 0.5 (ref 0–0.9)
MPV: 7 fL (ref 0–99.8)
POC Granulocyte: 3 (ref 2–6.9)
POC LYMPH PERCENT: 15.1 %L (ref 10–50)
POC MID %: 12.6 %M — AB (ref 0–12)
Platelet Count, POC: 159 10*3/uL (ref 142–424)
RBC: 4.15 M/uL — AB (ref 4.69–6.13)
RDW, POC: 13.1 %
WBC: 4.1 10*3/uL — AB (ref 4.6–10.2)

## 2015-02-18 LAB — POCT INFLUENZA A/B
Influenza A, POC: NEGATIVE
Influenza B, POC: NEGATIVE

## 2015-02-18 MED ORDER — AMOXICILLIN 500 MG PO CAPS: 1000.0000 mg | ORAL_CAPSULE | Freq: Two times a day (BID) | ORAL | Status: AC

## 2015-02-18 NOTE — Progress Notes (Signed)
Subjective:    Patient ID: Mark Gallagher, male    DOB: [birth year], 45 y.o.   MRN: [id number] By signing my name below, I, Judithe Modest, attest that this documentation has been prepared under the direction and in the presence of Tami Lin, MD. Electronically Signed: Judithe Modest, ER Scribe. 02/18/2015. 2:13 PM.  Chief Complaint  Patient presents with  . Sore Throat  . Generalized Body Aches    last pm    HPI HPI Comments: Mark Gallagher is a 45 y.o. male who presents to [facility] complaining of cough, rhinorrhea, fever, fatigue, and myalgias for the last 24 hours. Feels terrible. He denies nausea or vomiting, but states he has a reduced appetite. He had a flu shot this year.   Patient Active Problem List   Diagnosis Date Noted  . Adjustment disorder with depressed mood   . Tetraplegia ([place]) 09/19/2014  . Acute blood loss anemia 09/19/2014  . Cervical spondylosis with myelopathy 09/18/2014  . Radiculopathy of arm 08/28/2014  . Hypogonadism male 09/08/2013  . 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 Cardiomyopathy 04/01/2011  . Wolff-Parkinson-White (WPW) syndrome 04/01/2011    Allergies  Allergen Reactions  . Oxycodone     Nausea?   Current Outpatient Prescriptions on File Prior to Visit  Medication Sig Dispense Refill  . Cholecalciferol (VITAMIN D3) 2000 UNITS capsule Take by mouth.    . [facility] SENNA PLUS 8.6-50 MG tablet TAKE 2 TABLETS BY MOUTH TWICE A DAY AS NEEDED FOR CONSTIPATION 100 tablet 1  . ferrous sulfate 325 (65 FE) MG tablet Take 1 tablet (325 mg total) by mouth 2 (two) times daily with a meal. 60 tablet 1  . hydrocortisone (CORTEF) 20 MG tablet Take 20 mg in am and 10 mg in evenings    . levothyroxine (SYNTHROID, LEVOTHROID) 200 MCG tablet Take 1 tablet (200 mcg total) by mouth daily.    . Menthol-Methyl Salicylate (MUSCLE RUB) 10-15 % CREA Apply 1 application topically as needed for muscle pain.  0  . Naproxen Sodium (ALEVE PO) Take by mouth. Reported on  01/23/2015    . octreotide (SANDOSTATIN LAR DEPOT) 30 MG injection Inject 30 mg into the muscle every 28 (twenty-eight) days. 1 each 0  . traMADol (ULTRAM) 50 MG tablet Take 1 tablet by mouth every 6 (six) hours as needed. Reported on 01/23/2015  0  . verapamil (CALAN-SR) 120 MG CR tablet Take 1 tablet by mouth daily.    Marland Kitchen acetaminophen (TYLENOL) 325 MG tablet Take 1-2 tablets (325-650 mg total) by mouth every 4 (four) hours as needed for mild pain. (Patient not taking: Reported on 01/23/2015)    . famotidine (PEPCID) 20 MG tablet Take 1 tablet (20 mg total) by mouth 2 (two) times daily. For reflux (Patient not taking: Reported on 02/18/2015) 60 tablet 1  . fluticasone (FLONASE) 50 MCG/ACT nasal spray Place 2 sprays into both nostrils daily. (Patient not taking: Reported on 01/23/2015) 9.9 g 1  . ibuprofen (ADVIL,MOTRIN) 200 MG tablet Take by mouth. Reported on 02/18/2015    . polyethylene glycol (MIRALAX / GLYCOLAX) packet Take by mouth. Reported on 02/18/2015     Current Facility-Administered Medications on File Prior to Visit  Medication Dose Route Frequency Provider Last Rate Last Dose  . testosterone cypionate (DEPOTESTOSTERONE CYPIONATE) injection 200 mg  200 mg Intramuscular Q14 Days Gay Filler Copland, MD   200 mg at 02/14/15 1221    Review of Systems  Constitutional: Positive for fever, chills and fatigue.  HENT: Positive for congestion, rhinorrhea and sore throat.   Respiratory: Positive for cough. Negative for shortness of breath.   Musculoskeletal: Positive for myalgias.       Objective:  BP 120/64 mmHg  Pulse 140  Temp(Src) 99 F (37.2 C)  Resp 16  Ht 6\' 8"  (2.032 m)  Wt 242 lb (109.77 kg)  BMI 26.58 kg/m2  SpO2 96%  Physical Exam  Constitutional: He is oriented to person, place, and time. He appears well-developed and well-nourished.  ill  HENT:  Head: Normocephalic and atraumatic.  Right Ear: External ear normal.  Left Ear: External ear normal.  Mouth/Throat: Oropharynx is  clear and moist.  Tms clear Lots of clear mucus--boggy turbs L  Eyes: Conjunctivae and EOM are normal. Pupils are equal, round, and reactive to light.  Neck: Neck supple.  Cardiovascular: Normal rate and regular rhythm.   Pulmonary/Chest: Effort normal and breath sounds normal. No respiratory distress. He has no wheezes.  Abdominal: There is no tenderness.  Musculoskeletal: Normal range of motion.  Lymphadenopathy:    He has no cervical adenopathy.  Neurological: He is alert and oriented to person, place, and time. No cranial nerve deficit.  Skin: Skin is warm and dry.  Psychiatric: He has a normal mood and affect. His behavior is normal.  Nursing note and vitals reviewed.  Results for orders placed or performed in visit on 02/18/15  POCT Influenza A/B  Result Value Ref Range   Influenza A, POC Negative Negative   Influenza B, POC Negative Negative  POCT CBC  Result Value Ref Range   WBC 4.1 (A) 4.6 - 10.2 K/uL   Lymph, poc 0.6 0.6 - 3.4   POC LYMPH PERCENT 15.1 10 - 50 %L   MID (cbc) 0.5 0 - 0.9   POC MID % 12.6 (A) 0 - 12 %M   POC Granulocyte 3.0 2 - 6.9   Granulocyte percent 72.3 37 - 80 %G   RBC 4.15 (A) 4.69 - 6.13 M/uL   Hemoglobin 13.8 (A) 14.1 - 18.1 g/dL   HCT, POC 38.0 (A) 43.5 - 53.7 %   MCV 91.6 80 - 97 fL   MCH, POC 33.4 (A) 27 - 31.2 pg   MCHC 36.4 (A) 31.8 - 35.4 g/dL   RDW, POC 13.1 %   Platelet Count, POC 159 142 - 424 K/uL   MPV 7.0 0 - 99.8 fL       Assessment & Plan:  Fever, unspecified fever cause - Plan: POCT Influenza A/B, POCT CBC  Viral URI  Meds ordered this encounter- for him to hold and use only if he develops a sinus infection. Cause of his cranial architecture has had trouble with recurrent sinus problems  Medications  . amoxicillin (AMOXIL) 500 MG capsule    Sig: Take 2 capsules (1,000 mg total) by mouth 2 (two) times daily.    Dispense:  40 capsule    Refill:  0     I have completed the patient encounter in its entirety as  documented by the scribe, with editing by me where necessary. Eliza Grissinger P. Laney Pastor, M.D.

## 2015-02-19 ENCOUNTER — Ambulatory Visit: Payer: Managed Care, Other (non HMO) | Admitting: Physical Therapy

## 2015-02-19 ENCOUNTER — Ambulatory Visit: Payer: Managed Care, Other (non HMO) | Admitting: Occupational Therapy

## 2015-02-27 ENCOUNTER — Ambulatory Visit: Payer: Managed Care, Other (non HMO) | Admitting: Occupational Therapy

## 2015-02-27 ENCOUNTER — Ambulatory Visit: Payer: Managed Care, Other (non HMO) | Attending: Physical Medicine & Rehabilitation | Admitting: Physical Therapy

## 2015-02-27 DIAGNOSIS — R531 Weakness: Secondary | ICD-10-CM | POA: Diagnosis present

## 2015-02-27 DIAGNOSIS — R278 Other lack of coordination: Secondary | ICD-10-CM

## 2015-02-27 DIAGNOSIS — R269 Unspecified abnormalities of gait and mobility: Secondary | ICD-10-CM | POA: Insufficient documentation

## 2015-02-27 DIAGNOSIS — R2681 Unsteadiness on feet: Secondary | ICD-10-CM | POA: Diagnosis present

## 2015-02-27 DIAGNOSIS — R279 Unspecified lack of coordination: Secondary | ICD-10-CM | POA: Diagnosis present

## 2015-02-27 DIAGNOSIS — M6281 Muscle weakness (generalized): Secondary | ICD-10-CM | POA: Diagnosis present

## 2015-02-27 DIAGNOSIS — M25612 Stiffness of left shoulder, not elsewhere classified: Secondary | ICD-10-CM

## 2015-02-27 NOTE — Therapy (Signed)
[facility] [address] [place], [place], [address] Phone: [phone]   Fax:  [phone]  Physical Therapy Treatment  Patient Details  Name: Mark Gallagher MRN: [id number] Date of Birth: 11-19-1970 No Data Recorded  Encounter Date: 02/27/2015      PT End of Session - 02/27/15 1430    Visit Number 37   Number of Visits 45   Date for PT Re-Evaluation 04/09/15   Authorization Type [employer]    Authorization - Visit Number 5   Authorization - Number of Visits 30   PT Start Time 1020   PT Stop Time 1100   PT Time Calculation (min) 40 min   Equipment Utilized During Treatment Gait belt   Activity Tolerance Patient tolerated treatment well   Behavior During Therapy [facility] for tasks assessed/performed      Past Medical History  Diagnosis Date  . Thyroid disease   . Cancer ([place])   . 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 Testosterone deficiency   . Knee pain, bilateral   . History of pituitary tumor     Past Surgical History  Procedure Laterality Date  . Cervical disc surgery  2007    Anterior C4/5 vertebrectomy and c3-C6 laminoplasty with plating  . Craniotomy for tumor      pituitary adenoma    There were no vitals filed for this visit.  Visit Diagnosis:  Abnormality of gait  Weakness generalized  Decreased coordination      Subjective Assessment - 02/27/15 1025    Subjective Pt reports having had flu last week. Pt missed 3 days of work, but returned on Thursday. When asked about L hip flexor stretch for home, pt states, "It kind of makes my (left) leg tingle."   Patient is accompained by: Family member  mother   Pertinent History * Restrictions: no lifting > 30 lbs; do not work on neck ROM.  PMH: cervical spondylosis with myelopathy; s/p cervical vertebrectomy C4/5 with C3-6 plating 2007 acromegaly s/p pituitary tumor resection (1994); hypogonadism; Wolff-Parkinson-White Syndrome; cardiomyopathy; acute blood loss anemia;  adjustment disorder with depressed mood   Patient Stated Goals "I'd like to be able to walk by myself with no assistance, stand up out of a chair by myself, be able to get up and down stairs without assistance.   Currently in Pain? Yes   Pain Score 4    Pain Location Other (Comment)  between shoulder blades   Pain Orientation Right;Left;Medial   Pain Descriptors / Indicators Aching;Sore   Pain Type Acute pain   Pain Onset 1 to 4 weeks ago   Pain Frequency Constant   Aggravating Factors  exercise, work   Pain Relieving Factors resting, stretching   Multiple Pain Sites No                         [facility] Adult PT Treatment/Exercise - 02/27/15 0001    Ambulation/Gait   Ambulation/Gait Yes   Ambulation/Gait Assistance 5: Supervision   Ambulation/Gait Assistance Details Cueing for decreased gait velocity (for improved control), upright posture, wider BOS, and L hip/tibial external rotation to prevent L genu recurvatum.   Ambulation Distance (Feet) 220 Feet   Assistive device None;Other (Comment)  L AFO, L hinged knee brace   Gait Pattern Step-through pattern;Decreased stance time - left;Decreased weight shift to left;Lateral hip instability;Poor foot clearance - left;Left genu recurvatum  intermittent L genu recurvatum   Ambulation Surface Level;Indoor   Exercises  Exercises Other Exercises   Other Exercises  Reviewed home exercises added/modified at previous session (L hip flexor stretch, B single leg bridge, and EC on pillow). Progressed corner balance exercise from wide to narrow BOS due to noted improvement in stability/balance with exercise. Decreased reps on single leg bridging from 10 to 8 to promote improved quality of movement with cueing focused on avoiding contact of contralateral LE with bed/mat (to prevent compensation). Attempted supine L hip flexor stretch x60 seconds prior to onset of tingling in lateral L thigh, anterior R tibia. Attepmted to modify stretch  (step under L foot, thigh resting on table, and decreased L knee flexion); however, all modificatons caused numbness/tingling. Therefore, instructed pt to defer this stretch; modified paper handout to facilitate carryover.             Balance Exercises - 02/27/15 1429    Balance Exercises: Standing   Standing Eyes Closed Narrow base of support (BOS);Foam/compliant surface;4 reps;30 secs  1 pillow           PT Education - 02/27/15 1422    Education provided Yes   Education Details HEP: removed L hip flexor stretch (due to numbness/tingling); progressed corner balance, EC on pillow from feet apart to feet together.    Person(s) Educated Patient;Parent(s)   Methods Explanation;Demonstration;Verbal cues;Handout;Other (comment);Tactile cues  wrote modificatons on pt's HEP handout   Comprehension Verbalized understanding;Returned demonstration          PT Short Term Goals - 02/08/15 1317    PT SHORT TERM GOAL #1   Title Pt will perform initial HEP with mod I using paper handout to indicate safe HEP compliance, maximize functional gains made in PT. Target date: 11/06/14   Baseline Met 9/22.   Status Achieved   PT SHORT TERM GOAL #2   Title Perform 6 Minute Walk Test to assess/address functional endurance. Target date: 11/06/14   Baseline 9/20: 556' with RW   Status Achieved   PT SHORT TERM GOAL #3   Title Perform TUG to assess/address efficiency of functional mobility. Target date: 11/06/14   Baseline 9/20: TUG time = 28.3 seconds with RW   Status Achieved   PT SHORT TERM GOAL #4   Title Pt will ambulate 200' over level, indoor surfaces with mod I using LRAD to indicate increased safety/indepedence with household ambulation. Target date: 11/06/14   Baseline Met 10/12 with bariatric RW.   Status Achieved   PT SHORT TERM GOAL #5   Title Pt will negotiate 14 stairs with B rails and min A of single person to indicate increased independence accessing second floor of home. Target  date: 11/06/14   Baseline 10/10: required min guard of PT for stairs x16   Status Achieved   Additional Short Term Goals   Additional Short Term Goals Yes   PT SHORT TERM GOAL #6   Title Complete Berg Balance Scale and improve score by 4 points from baseline to progress toward significant improvement in functional standing balance. Target date: 01/02/15   Baseline 11/18: Berg score = 34/56   Status Achieved   PT SHORT TERM GOAL #7   Title Pt will improve gait velocity from 1.94 ft/sec to 2.24 ft/sec to indicate increased efficiency of ambulation. Target date: 01/02/15   Baseline 11/15: 1.94 ft/sec using LBQC      12/15: 2.54 ft/sec with [facility]   Status Achieved   PT SHORT TERM GOAL #8   Title Pt will ambulate 69' over level, indoor surfaces without AD  requiring supervision to progress toward independence with household mobility. Target date: 01/02/15   Baseline 12/15: ambulated x115' without AD with supervision   Status Achieved   PT SHORT TERM GOAL #9   TITLE Pt will ambulate 150' over paved, outdoor surfaces without AD requiring supervision to indicate increased independence with community ambulation. Target date: 03/08/15   Time 4   Period Weeks   Status New   PT SHORT TERM GOAL #10   TITLE TUG manual time will be within 40% of TUG normal time to indicate increased efficiency of mobility while dual-tasking. Target date: 03/08/15   Time 4   Period Weeks   Status New   PT SHORT TERM GOAL #11   TITLE Pt will ambulate x100' over indoor solid/compliant surfaces and around 10 obstacles with mod I using SPC to indicate increased safety using cane in work environment. Target date: 03/08/15   Time 4   Period Weeks   Status New           PT Long Term Goals - 02/08/15 1332    PT LONG TERM GOAL #1   Title Pt will improve 6 Minute Walk distance by 150' from baseline to indicate significant improvement in functional endurance. Target date: 12/04/14   Baseline 9/20: baseline 6MWT distance =  556' with RW;  11/15: 6MWT distance = 910 ' with RW   Status Achieved   PT LONG TERM GOAL #2   Title Pt will improve TUG score by 10.8 seconds from baseline to indicate significant improvement in efficiency of functional mobility.  Target date: 12/04/14   Baseline 9/20: baseline TUG = 28.4 sec with RW;  11/15: TUG = 17.67 with RW   Status Achieved   PT LONG TERM GOAL #3   Title Pt will ambulate 500' over unlevel, paved surfaces with mod I using LRAD to indicate increased stability/independence with community mobility. Target date: 12/04/14   Baseline Met 11/10 with LBQC.   Status Achieved   PT LONG TERM GOAL #4   Title Pt will negotiate standard ramp and curb step with mod I using LRAD to indicate increased safety/indepedence traversing community obstacles. Target date: 12/04/14   Baseline Met 11/10 with LBQC.   Status Achieved   PT LONG TERM GOAL #5   Title Pt will negotiate 14 stairs with 2 rails and mod I to indicate increased independence accessing second floor of home.  Target date: 12/04/14   Baseline Met 11/27/14.   Status Achieved   Additional Long Term Goals   Additional Long Term Goals Yes   PT LONG TERM GOAL #6   Title Pt will improve Berg score by 8 points from baseline to indicate significant improvement in functional standing balance. Target date: 02/06/15   Baseline 11/18: baseline Berg score = 34/56; 1/12: Berg score = 43/56   Status Achieved   PT LONG TERM GOAL #7   Title Pt will improve gait velocity to > 2.62 ft/sec to indicate status of community ambulator.  Target date: 02/06/15   Baseline 2.78 ft/sec w/ cane on 02/06/15   Status Achieved   PT LONG TERM GOAL #8   Title Pt will perform TUG in < 13.5 seconds to indicate decreased fall risk. Modified target date: 04/05/15   Baseline 17.13 with cane and 16.78 secs without cane 02/06/15  Continue as LTG through renewed POC.   Time 8   Period Weeks   Status Partially Met   PT LONG TERM GOAL  #9   TITLE Pt  will ambulate  30' over level, indoor surfaces without AD with mod I to indicate increased stability/independence with household ambulation.  Target date: 02/06/15   Baseline met 02/06/15    Time 8   Period Weeks   Status Achieved   PT LONG TERM GOAL  #10   TITLE Pt will ambulate 150' indoors over level and unlevel surfaces of varying heights and compliance with no AD and mod I to demonstrate safety/independence with ambulation during work. Target date: 04/05/15   Time 8   Period Weeks   Status New   PT LONG TERM GOAL  #11   TITLE TUG manual will be within 25% of TUG normal to indicate increased efficiency of mobility while dual-tasking. Target date: 04/05/15   Time 8   Period Weeks   Status New   PT LONG TERM GOAL  #12   TITLE Pt will ambulate 200' over unlevel, paved surfaces with mod I (for increased time, use of orthoses) but without AD to indicate increased independence with community mobility. Target date: 04/05/15   Time 8   Period Weeks   Status New               Plan - 02/27/15 1431    Clinical Impression Statement Session focused on gait training without AD, progressing HEP to increase LE strength (focus on L stance stability). Pt tolerated intervention well, but did note increased numbness/tingling with L hip flexor stretch; therefore, removed this stretrch from HEP.    Pt will benefit from skilled therapeutic intervention in order to improve on the following deficits Abnormal gait;Decreased activity tolerance;Decreased balance;Decreased endurance;Decreased coordination;Decreased mobility;Decreased strength;Impaired sensation;Pain   Rehab Potential Good   PT Frequency 1x / week   PT Duration 8 weeks   PT Treatment/Interventions Vestibular;ADLs/Self Care Home Management;DME Instruction;Balance training;Therapeutic exercise;Therapeutic activities;Functional mobility training;Stair training;Gait training;Neuromuscular re-education;Patient/family education;Electrical Stimulation   PT Next  Visit Plan Check STG's.  Gait training with SPC and without AD. L hiip strengthening (emphasis on extensors, abductors).   Consulted and Agree with Plan of Care Patient;Family member/caregiver   Family Member Consulted mother        Problem List Patient Active Problem List   Diagnosis Date Noted  . Adjustment disorder with depressed mood   . Tetraplegia ([place]) 09/19/2014  . Acute blood loss anemia 09/19/2014  . Cervical spondylosis with myelopathy 09/18/2014  . Radiculopathy of arm 08/28/2014  . Hypogonadism male 09/08/2013  . 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 Cardiomyopathy 04/01/2011  . Wolff-Parkinson-White (WPW) syndrome 04/01/2011   Billie Ruddy, PT, DPT [facility] [address] [place], [place], [address] Phone: [phone]   Fax:  [phone] 02/27/2015, 2:34 PM   Name: Mark Gallagher MRN: [id number] Date of Birth: November 06, 1970

## 2015-02-27 NOTE — Therapy (Signed)
[facility] [address] [place], [place], [address] Phone: [phone]   Fax:  [phone]  Occupational Therapy Treatment  Patient Details  Name: Mark Gallagher MRN: [id number] Date of Birth: 06-22-1970 No Data Recorded  Encounter Date: 02/27/2015      OT End of Session - 02/27/15 1104    Visit Number 33   Number of Visits 35  [phone]=35   Date for OT Re-Evaluation 03/09/15   Authorization Type [employer], 60 visit limit combined   Authorization Time Period renewal completed 02/08/15, week 2/4   Authorization - Visit Number 5   Authorization - Number of Visits 30   OT Start Time 1102   OT Stop Time 1145   OT Time Calculation (min) 43 min   Activity Tolerance Patient tolerated treatment well   Behavior During Therapy [facility] for tasks assessed/performed      Past Medical History  Diagnosis Date  . Thyroid disease   . Cancer ([place])   . 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 Testosterone deficiency   . Knee pain, bilateral   . History of pituitary tumor     Past Surgical History  Procedure Laterality Date  . Cervical disc surgery  2007    Anterior C4/5 vertebrectomy and c3-C6 laminoplasty with plating  . Craniotomy for tumor      pituitary adenoma    There were no vitals filed for this visit.  Visit Diagnosis:  Muscle weakness (generalized)  Shoulder joint stiffness, left      Subjective Assessment - 02/27/15 1111    Subjective  Pt reports that he feels that the exercises are helping   Patient is accompained by: Family member   Pertinent History C3-T2 fusion 09/10/14 with cardiac surgery after surgery, hx of previous C1-C6 fusion (2007)   Limitations cleared for overhead reaching; lifting restriction of 30lbs, cleared for shoulder strengthening, **Do not work on neck motion   Patient Stated Goals return to work   Currently in Pain? Yes   Pain Score 3    Pain Location --  between shoulder blades   Pain Orientation  Right;Left;Mid   Pain Descriptors / Indicators Aching;Sore   Pain Type Acute pain   Aggravating Factors  exercise, work   Pain Relieving Factors resting, stretching         OT Treatments/Exercises (OP) - 02/15/15 0001    Shoulder Exercises: Supine   Shoulder Flexion AAROM with cane    ABduction AAROM;10 reps  with cane   Other Supine Exercises shoulder flex with 3lb wt x10.   Shoulder Exercises: Seated   Flexion Strengthening;Weights;10 reps;Both  with 4.4.lb ball,  LUE shoulder flex and abduction with 2lb wt x10 each.    4.4 lb ball, chest press with scapular retraction and diagonals to each side x10 each    Other Seated Exercises Arm bike x27mn level 7 (forward/backwards) without rest for conditioning.   Shoulder Exercises: Prone   Other Prone Exercises In prone over ball, scapular retraction with 3lb wt with elbow bent, then shoulder ext with 3lb wt, and abduction with 2lb wt x15 each   Other Prone Exercises In modified quadraped (standing with hands on mat). Foward/back wt. shifts x10 for incr scapular stabilization. Followed by scapular retraction in this position x10   Shoulder Exercises: Standing   Other Standing Exercises Wall push ups x10 for incr scapular  stability/strength;  AAROM shoulder flex with ball along wall  OT Education - 02/27/15 2106    Education Details Discussed progress and plan to d/c in 2 sessions   Person(s) Educated Patient   Methods Explanation   Comprehension Verbalized understanding          OT Short Term Goals - 02/08/15 1148    OT SHORT TERM GOAL #1   Title Pt will verbalize understanding of coordination/hand strength HEP--check STGs 11/08/14   Time 4   Period Weeks   Status Achieved   OT SHORT TERM GOAL #2   Title Pt will perfom BADLs mod I.   Baseline min A bathing   Time 4   Period Weeks   Status Achieved  11/06/14  supervision for shower transfer, min A  for R brace/neck brace;  11/27/14 met   OT SHORT TERM GOAL #3   Title Pt will improve L hand coordination for ADLs as shown by completing 9-hole peg test in less than 55sec.   Baseline 77.34sec   Time 4   Period Weeks   Status Achieved  11/06/14:  56.94sec;  11/27/14  45.69sec   OT SHORT TERM GOAL #4   Title Pt will demo at least 40lbs L grip strength for ADLs/opening containers.   Baseline 30lbs   Time 4   Period Weeks   Status Achieved  11/06/14:  45lbs   OT SHORT TERM GOAL #5   Title Pt will improve L hand coordination for ADLs as shown by completing 9-hole peg test in less than 40sec.--check 12/29/14   Baseline 45.56 secs, 34.12 secs   Time 4   Period Weeks   Status Achieved  02/08/15 38.91sec, 33.19sec   OT SHORT TERM GOAL #6   Title Pt will be able to perform overhead reaching with LUE for at least 29mn without rest break for incr activity tolerance.--check 12/29/14   Time 4   Period Weeks   Status Achieved   OT SHORT TERM GOAL #7   Title Pt will demo at least 55lbs L grip strength for ADLs/opening containers.--check 12/29/14   Time 4   Period Weeks   Status Achieved  12/25/14:  49lbs; 01/08/15  60lbs           OT Long Term Goals - 02/08/15 1128    OT LONG TERM GOAL #1   Title Pt will be independent with proximal UE HEP when cleared from cervical/lifting precautions.--check updated LTGs 01/29/15   Status Achieved  11/27/14 pt not yet cleared from cervical precautions, met 02/01/15   OT LONG TERM GOAL #2   Title Pt will perform simple meal prep/home maintenance tasks mod I.   Status Partially Met  not fully met 11/30/14;  02/06/15  only not performing cooking due to fatigue with HEP and return to work (parents performing cooking) but is performing home maintenance tasks   OT LONG TERM GOAL #3   Title Pt will improve L hand coordination for ADLs as shown by completing 9-hole peg test in less than 37sec.   Baseline 47.88secs  11/21/14; 11/27/14  45.69sec  (revised goal to <37  instead of less than 45sec for renewal)   Status Achieved  02/08/15;  38.91, 33.19sec   OT LONG TERM GOAL #4   Title Pt will demo at least 65lbs L grip strength for ADLs/opening containers.   Baseline revised to 60 for renewal 11/30/14; upgraded to 65lbs 01/08/15 due to goal met   Status Achieved  11/27/14:  49lbs; 12/25/14  49lbs; 01/08/15  met 60lbs   OT LONG TERM  GOAL #5   Title Pt will be able to retrieve 2lb object from overhead shelf with LUE.   Time 8   Period Weeks   Status Achieved  01/08/15:  unable to perform with 1lb without compensation (revised goal from 4lbs to 2lbs);  02/08/15  met at approx this level   Long Term Additional Goals   Additional Long Term Goals Yes   OT LONG TERM GOAL #6   Title added 01/08/15:  Pt will demo at least 140* L shoulder flexion for functional reaching.   Time 4   Status Achieved  02/06/15   OT LONG TERM GOAL #7   Title Pt will be independent with final HEP--check LTGs 03/09/15   Time 4   Status New   OT LONG TERM GOAL #8   Title Pt will demo at least 150 degrees L shoulder flex for functional reaching.   Baseline 4   Status New   OT LONG TERM GOAL  #9   Baseline Pt will be able to retrieve 3lb object from overhead shelf with LUE without compensation/safely.   Time 4   Period Weeks   Status New               Plan - 02/27/15 1141    Clinical Impression Statement Pt continues to make progress with strength and is progressing well.   Plan review/update HEP prn   OT Home Exercise Plan Education issued: coordination 10/12/14, cane HEP and yellow band (bicep/triceps); 12/25/14 updated HEP; updated HEP to red band 02/15/15   Consulted and Agree with Plan of Care Patient;Family member/caregiver   Family Member Consulted mother        Problem List Patient Active Problem List   Diagnosis Date Noted  . Adjustment disorder with depressed mood   . Tetraplegia ([place]) 09/19/2014  . Acute blood loss anemia 09/19/2014  . Cervical  spondylosis with myelopathy 09/18/2014  . Radiculopathy of arm 08/28/2014  . Hypogonadism male 09/08/2013  . 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 Cardiomyopathy 04/01/2011  . Wolff-Parkinson-White (WPW) syndrome 04/01/2011    [facility] 02/27/2015, 9:08 PM  [facility] [address] [place], [place], [address] Phone: [phone]   Fax:  [phone]  Name: Mark Gallagher MRN: [id number] Date of Birth: 01/19/1971

## 2015-02-28 ENCOUNTER — Ambulatory Visit (INDEPENDENT_AMBULATORY_CARE_PROVIDER_SITE_OTHER): Payer: Managed Care, Other (non HMO) | Admitting: *Deleted

## 2015-02-28 DIAGNOSIS — E291 Testicular hypofunction: Secondary | ICD-10-CM | POA: Diagnosis not present

## 2015-02-28 DIAGNOSIS — E220 Acromegaly and pituitary gigantism: Secondary | ICD-10-CM | POA: Diagnosis not present

## 2015-02-28 MED ORDER — OCTREOTIDE ACETATE 30 MG IM KIT
30.0000 mg | PACK | Freq: Once | INTRAMUSCULAR | Status: AC
  Administered 2015-02-28: 30 mg via INTRAMUSCULAR

## 2015-02-28 MED ORDER — TESTOSTERONE CYPIONATE 200 MG/ML IM SOLN
200.0000 mg | INTRAMUSCULAR | Status: DC
  Administered 2015-02-28 – 2015-04-26 (×4): 200 mg via INTRAMUSCULAR

## 2015-02-28 NOTE — Progress Notes (Signed)
   Subjective:    Patient ID: Mark Gallagher, male    DOB: September 27, 1970, 45 y.o.   MRN: [id number]  HPI Sandostatin [place]AR [place] 30 mg injection and Testosterone 200 mg/ml,  0.5mg   injection given to pt.    Review of Systems     Objective:   Physical Exam        Assessment & Plan:

## 2015-03-08 ENCOUNTER — Encounter: Payer: Self-pay | Admitting: Rehabilitation

## 2015-03-08 ENCOUNTER — Ambulatory Visit: Payer: Managed Care, Other (non HMO) | Admitting: Rehabilitation

## 2015-03-08 ENCOUNTER — Ambulatory Visit: Payer: Managed Care, Other (non HMO) | Admitting: Occupational Therapy

## 2015-03-08 DIAGNOSIS — R269 Unspecified abnormalities of gait and mobility: Secondary | ICD-10-CM | POA: Diagnosis not present

## 2015-03-08 DIAGNOSIS — R531 Weakness: Secondary | ICD-10-CM

## 2015-03-08 DIAGNOSIS — R2681 Unsteadiness on feet: Secondary | ICD-10-CM

## 2015-03-08 DIAGNOSIS — M25612 Stiffness of left shoulder, not elsewhere classified: Secondary | ICD-10-CM

## 2015-03-08 DIAGNOSIS — R278 Other lack of coordination: Secondary | ICD-10-CM

## 2015-03-08 DIAGNOSIS — R279 Unspecified lack of coordination: Secondary | ICD-10-CM

## 2015-03-08 NOTE — Therapy (Signed)
[facility] [address] [place], [place], [address] Phone: [phone]   Fax:  [phone]  Physical Therapy Treatment  Patient Details  Name: Mark Gallagher MRN: [id number] Date of Birth: Apr 30, 1970 No Data Recorded  Encounter Date: 03/08/2015      PT End of Session - 03/08/15 1024    Visit Number 38   Number of Visits 45   Date for PT Re-Evaluation 04/09/15   Authorization Type Aetna    Authorization - Visit Number 6   Authorization - Number of Visits 30   PT Start Time 6803   PT Stop Time 1105   PT Time Calculation (min) 50 min   Equipment Utilized During Treatment Gait belt   Activity Tolerance Patient tolerated treatment well   Behavior During Therapy [facility] for tasks assessed/performed      Past Medical History  Diagnosis Date  . Thyroid disease   . Cancer ([place])   . 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 Testosterone deficiency   . Knee pain, bilateral   . History of pituitary tumor     Past Surgical History  Procedure Laterality Date  . Cervical disc surgery  2007    Anterior C4/5 vertebrectomy and c3-C6 laminoplasty with plating  . Craniotomy for tumor      pituitary adenoma    There were no vitals filed for this visit.  Visit Diagnosis:  Abnormality of gait  Weakness generalized  Unsteadiness  Decreased coordination      Subjective Assessment - 03/08/15 1020    Subjective Reports working full time, still using quad cane at work due to uneven surfaces.  Goes to cardiologist on Wednesday about possible ablation to decrease medication.    Patient is accompained by: Family member   Pertinent History * Restrictions: no lifting > 30 lbs; do not work on neck ROM.  PMH: cervical spondylosis with myelopathy; s/p cervical vertebrectomy C4/5 with C3-6 plating 2007 acromegaly s/p pituitary tumor resection (1994); hypogonadism; Wolff-Parkinson-White Syndrome; cardiomyopathy; acute blood loss anemia; adjustment  disorder with depressed mood   Patient Stated Goals "I'd like to be able to walk by myself with no assistance, stand up out of a chair by myself, be able to get up and down stairs without assistance.   Currently in Pain? Yes   Pain Score 3    Pain Location --  from shoulder to shoulder (on the back)   Pain Orientation Right;Left;Mid   Pain Descriptors / Indicators Aching;Sore   Pain Type Chronic pain   Pain Onset 1 to 4 weeks ago   Pain Frequency Constant   Aggravating Factors  exercise, work    Pain Relieving Factors rest, stretching   Pain Score 3   Pain Location Leg   Pain Orientation Left   Pain Descriptors / Indicators Sore;Burning   Pain Type Acute pain   Pain Onset In the past 7 days   Aggravating Factors  exercise   Pain Relieving Factors sitting            TE: Provided pt with knee to chest and piriformis stretch, see pt instruction.  Performed x 2 reps x 1-2 minute each.  Education on technique with pt returning demonstration with min to mod cues.  Provided handout with further education.    TA: Performed manual TUG x 2 reps during session as first rep, pt had to stop and return to chair due to spilled water and verbal cues to return to chair as not to continue walking.  Pt did not meet this goal as it took him approx 24 seconds as compared to approx 16 seconds for regular TUG.   Will continue to address this goal.    Gait: Assessed gait outdoors without AD to address STG.  Performed >500' over paved outdoor surfaces, however requires min/guard for safety at this time.  Educated pt to continue to use quad cane at this time until outdoor gait can further be assessed with SPC.  Pt and mother verbalized understanding.  During gait, note that he demonstrates very antalgic gait pattern during gait with increased pain in L glute and quad region, esp when trying to increase activation and attempt upright posture.  Seems to be muscular in nature and not radicular during this  session.  Provided stretches as above.    Self Care: Education on use of quad cane outdoors until can be further assessed, meaning of TUG scores, and HEP for stretching.                      PT Education - 03/08/15 1024    Education provided Yes   Education Details HEP for stretching to do for the next week to assist with L glute and quad pain   Person(s) Educated Patient;Parent(s)   Methods Explanation;Handout   Comprehension Verbalized understanding          PT Short Term Goals - 03/08/15 1025    PT SHORT TERM GOAL #1   Title Pt will perform initial HEP with mod I using paper handout to indicate safe HEP compliance, maximize functional gains made in PT. Target date: 11/06/14   Baseline Met 9/22.   Status Achieved   PT SHORT TERM GOAL #2   Title Perform 6 Minute Walk Test to assess/address functional endurance. Target date: 11/06/14   Baseline 9/20: 556' with RW   Status Achieved   PT SHORT TERM GOAL #3   Title Perform TUG to assess/address efficiency of functional mobility. Target date: 11/06/14   Baseline 9/20: TUG time = 28.3 seconds with RW   Status Achieved   PT SHORT TERM GOAL #4   Title Pt will ambulate 200' over level, indoor surfaces with mod I using LRAD to indicate increased safety/indepedence with household ambulation. Target date: 11/06/14   Baseline Met 10/12 with bariatric RW.   Status Achieved   PT SHORT TERM GOAL #5   Title Pt will negotiate 14 stairs with B rails and min A of single person to indicate increased independence accessing second floor of home. Target date: 11/06/14   Baseline 10/10: required min guard of PT for stairs x16   Status Achieved   PT SHORT TERM GOAL #6   Title Complete Berg Balance Scale and improve score by 4 points from baseline to progress toward significant improvement in functional standing balance. Target date: 01/02/15   Baseline 11/18: Berg score = 34/56   Status Achieved   PT SHORT TERM GOAL #7   Title Pt  will improve gait velocity from 1.94 ft/sec to 2.24 ft/sec to indicate increased efficiency of ambulation. Target date: 01/02/15   Baseline 11/15: 1.94 ft/sec using LBQC      12/15: 2.54 ft/sec with [facility]   Status Achieved   PT SHORT TERM GOAL #8   Title Pt will ambulate 78' over level, indoor surfaces without AD requiring supervision to progress toward independence with household mobility. Target date: 01/02/15   Baseline 12/15: ambulated x115' without AD with supervision   Status Achieved  PT SHORT TERM GOAL #9   TITLE Pt will ambulate 150' over paved, outdoor surfaces without AD requiring supervision to indicate increased independence with community ambulation. Target date: 03/08/15   Baseline requires min/guard on 03/08/15 (due to increased pain in L leg)   Time 4   Period Weeks   Status Not Met   PT SHORT TERM GOAL #10   TITLE TUG manual time will be within 40% of TUG normal time to indicate increased efficiency of mobility while dual-tasking. Target date: 03/08/15   Baseline Original TUG was 16.78 and manual TUG was approx 24 secs on 03/08/15   Time 4   Period Weeks   Status Not Met   PT SHORT TERM GOAL #11   TITLE Pt will ambulate x100' over indoor solid/compliant surfaces and around 10 obstacles with mod I using SPC to indicate increased safety using cane in work environment. Target date: 03/08/15   Time 4   Period Weeks   Status New           PT Long Term Goals - 02/08/15 1332    PT LONG TERM GOAL #1   Title Pt will improve 6 Minute Walk distance by 150' from baseline to indicate significant improvement in functional endurance. Target date: 12/04/14   Baseline 9/20: baseline 6MWT distance = 556' with RW;  11/15: 6MWT distance = 910 ' with RW   Status Achieved   PT LONG TERM GOAL #2   Title Pt will improve TUG score by 10.8 seconds from baseline to indicate significant improvement in efficiency of functional mobility.  Target date: 12/04/14   Baseline 9/20: baseline TUG = 28.4  sec with RW;  11/15: TUG = 17.67 with RW   Status Achieved   PT LONG TERM GOAL #3   Title Pt will ambulate 500' over unlevel, paved surfaces with mod I using LRAD to indicate increased stability/independence with community mobility. Target date: 12/04/14   Baseline Met 11/10 with LBQC.   Status Achieved   PT LONG TERM GOAL #4   Title Pt will negotiate standard ramp and curb step with mod I using LRAD to indicate increased safety/indepedence traversing community obstacles. Target date: 12/04/14   Baseline Met 11/10 with LBQC.   Status Achieved   PT LONG TERM GOAL #5   Title Pt will negotiate 14 stairs with 2 rails and mod I to indicate increased independence accessing second floor of home.  Target date: 12/04/14   Baseline Met 11/27/14.   Status Achieved   Additional Long Term Goals   Additional Long Term Goals Yes   PT LONG TERM GOAL #6   Title Pt will improve Berg score by 8 points from baseline to indicate significant improvement in functional standing balance. Target date: 02/06/15   Baseline 11/18: baseline Berg score = 34/56; 1/12: Berg score = 43/56   Status Achieved   PT LONG TERM GOAL #7   Title Pt will improve gait velocity to > 2.62 ft/sec to indicate status of community ambulator.  Target date: 02/06/15   Baseline 2.78 ft/sec w/ cane on 02/06/15   Status Achieved   PT LONG TERM GOAL #8   Title Pt will perform TUG in < 13.5 seconds to indicate decreased fall risk. Modified target date: 04/05/15   Baseline 17.13 with cane and 16.78 secs without cane 02/06/15  Continue as LTG through renewed POC.   Time 8   Period Weeks   Status Partially Met   PT LONG TERM GOAL  #9  TITLE Pt will ambulate 17' over level, indoor surfaces without AD with mod I to indicate increased stability/independence with household ambulation.  Target date: 02/06/15   Baseline met 02/06/15    Time 8   Period Weeks   Status Achieved   PT LONG TERM GOAL  #10   TITLE Pt will ambulate 150' indoors over level and  unlevel surfaces of varying heights and compliance with no AD and mod I to demonstrate safety/independence with ambulation during work. Target date: 04/05/15   Time 8   Period Weeks   Status New   PT LONG TERM GOAL  #11   TITLE TUG manual will be within 25% of TUG normal to indicate increased efficiency of mobility while dual-tasking. Target date: 04/05/15   Time 8   Period Weeks   Status New   PT LONG TERM GOAL  #12   TITLE Pt will ambulate 200' over unlevel, paved surfaces with mod I (for increased time, use of orthoses) but without AD to indicate increased independence with community mobility. Target date: 04/05/15   Time 8   Period Weeks   Status New               Plan - 03/08/15 1024    Clinical Impression Statement Skilled session focused on assessment of STG's and HEP for LLE stretching to decrease pain.  Note he did not meet goal for outdoor ambulation without cane at this time and did not meet manual TUG goal as he requires increased time and spilled good amount of water during task.     Pt will benefit from skilled therapeutic intervention in order to improve on the following deficits Abnormal gait;Decreased activity tolerance;Decreased balance;Decreased endurance;Decreased coordination;Decreased mobility;Decreased strength;Impaired sensation;Pain   Rehab Potential Good   PT Frequency 1x / week   PT Duration 8 weeks   PT Treatment/Interventions Vestibular;ADLs/Self Care Home Management;DME Instruction;Balance training;Therapeutic exercise;Therapeutic activities;Functional mobility training;Stair training;Gait training;Neuromuscular re-education;Patient/family education;Electrical Stimulation   PT Next Visit Plan Check last STG's, assess L LE pain (did stretching help).  Gait training with SPC and without AD. L hiip strengthening (emphasis on extensors, abductors).   Consulted and Agree with Plan of Care Patient;Family member/caregiver   Family Member Consulted mother         Problem List Patient Active Problem List   Diagnosis Date Noted  . Adjustment disorder with depressed mood   . Tetraplegia ([place]) 09/19/2014  . Acute blood loss anemia 09/19/2014  . Cervical spondylosis with myelopathy 09/18/2014  . Radiculopathy of arm 08/28/2014  . Hypogonadism male 09/08/2013  . 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 Cardiomyopathy 04/01/2011  . Wolff-Parkinson-White (WPW) syndrome 04/01/2011    Cameron Sprang, PT, MPT [facility] [address] [place], [place], [address] Phone: [phone]   Fax:  [phone] 03/08/2015, 12:15 PM  Name: ADAIAH MORKEN MRN: [id number] Date of Birth: 1970/12/04

## 2015-03-08 NOTE — Therapy (Signed)
[facility] [address] [place], [place], [address] Phone: [phone]   Fax:  [phone]  Occupational Therapy Treatment  Patient Details  Name: Mark Gallagher MRN: [id number] Date of Birth: [birth year] No Data Recorded  Encounter Date: 03/08/2015      OT End of Session - 03/08/15 1111    Visit Number 34   Number of Visits 35  31+4=35   Date for OT Re-Evaluation 03/09/15   Authorization Type [employer], 60 visit limit combined   Authorization Time Period renewal completed 02/08/15, week 34   Authorization - Visit Number 6   Authorization - Number of Visits 30   OT Start Time 1093   OT Stop Time 1148   OT Time Calculation (min) 43 min   Activity Tolerance Patient tolerated treatment well   Behavior During Therapy [facility] for tasks assessed/performed      Past Medical History  Diagnosis Date  . Thyroid disease   . Cancer ([place])   . 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 Testosterone deficiency   . Knee pain, bilateral   . History of pituitary tumor     Past Surgical History  Procedure Laterality Date  . Cervical disc surgery  2007    Anterior C4/5 vertebrectomy and c3-C6 laminoplasty with plating  . Craniotomy for tumor      pituitary adenoma    There were no vitals filed for this visit.  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 Yes   Pain Score 3    Pain Location --   between shoulder blades   Pain Orientation Right;Left;Mid;Upper   Pain Descriptors / Indicators Aching;Sore   Pain Type Chronic pain   Pain Frequency Intermittent   Aggravating Factors  exercise, work    Pain Relieving Factors rest, stretching            OT Treatments/Exercises (OP) - 02/15/15 0001    Shoulder Exercises:    Shoulder Flexion AAROM with cane in supine   ABduction AAROM;10 reps  with canein sitting       Shoulder Exercises: Seated   Flexion Strengthening;Weights;15 reps;Both  with 5.5.lb ball,   5.5 lb ball, chest press with scapular retraction,  floor to chest height, and diagonals to each side x10 each with BUEs       Shoulder Exercises: Prone   Other Prone Exercises In prone over ball, scapular retraction with 3lb wt with elbow bent, then shoulder ext with 3lb wt, and abduction with 3lb wt x15 each   Other Prone Exercises In modified quadraped (standing with hands on mat). Foward/back wt. shifts x10 for incr scapular stabilization.                                           OT Education - 03/08/15 1151  Education provided Yes   Education Details Discussed progress in prep for d/c next session; Appropriate continuing fitness opportunities/limitations and things to discuss with PT; Updated band HEP to green and pt returned demo each x10-15   Person(s) Educated Patient;Parent(s)   Methods Explanation;Demonstration   Comprehension Verbalized understanding;Returned demonstration          OT Short Term Goals - 02/08/15 1148    OT SHORT TERM GOAL #1   Title Pt will verbalize understanding of coordination/hand strength HEP--check STGs 11/08/14   Time 4   Period Weeks   Status Achieved   OT SHORT TERM GOAL #2   Title Pt will perfom BADLs mod I.   Baseline min A bathing   Time 4   Period Weeks   Status Achieved  11/06/14  supervision for shower transfer, min A for R brace/neck  brace;  11/27/14 met   OT SHORT TERM GOAL #3   Title Pt will improve L hand coordination for ADLs as shown by completing 9-hole peg test in less than 55sec.   Baseline 77.34sec   Time 4   Period Weeks   Status Achieved  11/06/14:  56.94sec;  11/27/14  45.69sec   OT SHORT TERM GOAL #4   Title Pt will demo at least 40lbs L grip strength for ADLs/opening containers.   Baseline 30lbs   Time 4   Period Weeks   Status Achieved  11/06/14:  45lbs   OT SHORT TERM GOAL #5   Title Pt will improve L hand coordination for ADLs as shown by completing 9-hole peg test in less than 40sec.--check 12/29/14   Baseline 45.56 secs, 34.12 secs   Time 4   Period Weeks   Status Achieved  02/08/15 38.91sec, 33.19sec   OT SHORT TERM GOAL #6   Title Pt will be able to perform overhead reaching with LUE for at least 61mn without rest break for incr activity tolerance.--check 12/29/14   Time 4   Period Weeks   Status Achieved   OT SHORT TERM GOAL #7   Title Pt will demo at least 55lbs L grip strength for ADLs/opening containers.--check 12/29/14   Time 4   Period Weeks   Status Achieved  12/25/14:  49lbs; 01/08/15  60lbs           OT Long Term Goals - 03/08/15 1159    OT LONG TERM GOAL #1   Title Pt will be independent with proximal UE HEP when cleared from cervical/lifting precautions.--check updated LTGs 01/29/15   Status Achieved  11/27/14 pt not yet cleared from cervical precautions, met 02/01/15   OT LONG TERM GOAL #2   Title Pt will perform simple meal prep/home maintenance tasks mod I.   Status Partially Met  not fully met 11/30/14;  02/06/15  only not performing cooking due to fatigue with HEP and return to work (parents performing cooking) but is performing home maintenance tasks   OT LONG TERM GOAL #3   Title Pt will improve L hand coordination for ADLs as shown by completing 9-hole peg test in less than 37sec.   Baseline 47.88secs  11/21/14; 11/27/14  45.69sec  (revised goal to <37 instead of less  than 45sec for renewal)   Status Achieved  02/08/15;  38.91, 33.19sec   OT LONG TERM GOAL #4   Title Pt will demo at least 65lbs L grip strength for ADLs/opening containers.   Baseline revised to 60 for renewal 11/30/14; upgraded to 65lbs 01/08/15 due to goal met  Status Achieved  11/27/14:  49lbs; 12/25/14  49lbs; 01/08/15  met 60lbs   OT LONG TERM GOAL #5   Title Pt will be able to retrieve 2lb object from overhead shelf with LUE.   Time 8   Period Weeks   Status Achieved  01/08/15:  unable to perform with 1lb without compensation (revised goal from 4lbs to 2lbs);  02/08/15  met at approx this level   OT LONG TERM GOAL #6   Title added 01/08/15:  Pt will demo at least 140* L shoulder flexion for functional reaching.   Time 4   Status Achieved  02/06/15   OT LONG TERM GOAL #7   Title Pt will be independent with final HEP--check LTGs 03/09/15   Time 4   Status New   OT LONG TERM GOAL #8   Title Pt will demo at least 150 degrees L shoulder flex for functional reaching.   Baseline 4   Status New   OT LONG TERM GOAL  #9   Baseline Pt will be able to retrieve 3lb object from overhead shelf with LUE without compensation/safely.   Time 4   Period Weeks   Status New               Problem List Patient Active Problem List   Diagnosis Date Noted  . Adjustment disorder with depressed mood   . Tetraplegia ([place]) 09/19/2014  . Acute blood loss anemia 09/19/2014  . Cervical spondylosis with myelopathy 09/18/2014  . Radiculopathy of arm 08/28/2014  . Hypogonadism male 09/08/2013  . 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 Cardiomyopathy 04/01/2011  . Wolff-Parkinson-White (WPW) syndrome 04/01/2011    [facility] 03/08/2015, 12:02 PM  [place] [address] [place], [place], [address] Phone: [phone]   Fax:  [phone]  Name: JAMARL PEW MRN: [id number] Date of Birth: [birth year]  Vianne Bulls, OTR/L [facility] [address] [place], [place]  [address] [phone] phone [phone] 03/08/2015 12:02 PM

## 2015-03-08 NOTE — Patient Instructions (Signed)
HIP / KNEE: Flexion, Knee to Chest - Supine    Raise left knee to chest. Keep leg in a straight line.  Use your hands to hold your leg in place (uncomfortable but not painful) and hold for 60 seconds.  Do this two times in the morning and 2 times in the afternoon after you do your exercises.    Copyright  VHI. All rights reserved.   Hip Extension    Lie on back, legs in air, knees bent. Grasp hands behind right thigh and cross left leg over right thigh. Hold _60___ seconds.  (Do two times in the morning and two times after your exercises in the afternoon).  Repeat __2__ times. Do __4__ sessions per day.  Copyright  VHI. All rights reserved.

## 2015-03-13 DIAGNOSIS — I493 Ventricular premature depolarization: Secondary | ICD-10-CM | POA: Insufficient documentation

## 2015-03-15 ENCOUNTER — Encounter: Payer: Self-pay | Admitting: Rehabilitation

## 2015-03-15 ENCOUNTER — Ambulatory Visit: Payer: Managed Care, Other (non HMO) | Admitting: Occupational Therapy

## 2015-03-15 ENCOUNTER — Ambulatory Visit: Payer: Managed Care, Other (non HMO) | Admitting: Rehabilitation

## 2015-03-15 DIAGNOSIS — R269 Unspecified abnormalities of gait and mobility: Secondary | ICD-10-CM

## 2015-03-15 DIAGNOSIS — R279 Unspecified lack of coordination: Secondary | ICD-10-CM

## 2015-03-15 DIAGNOSIS — R531 Weakness: Secondary | ICD-10-CM

## 2015-03-15 DIAGNOSIS — R278 Other lack of coordination: Secondary | ICD-10-CM

## 2015-03-15 DIAGNOSIS — M25612 Stiffness of left shoulder, not elsewhere classified: Secondary | ICD-10-CM

## 2015-03-15 DIAGNOSIS — R2681 Unsteadiness on feet: Secondary | ICD-10-CM

## 2015-03-15 NOTE — Patient Instructions (Signed)
   Lie on back holding wand. Raise arms over head. Hold 5sec. Repeat 10 times per set.  Do 1 sessions per day.   ROM: Abduction - Wand   Holding wand with left hand palm up, push wand directly out to side, leading with other hand palm down, until stretch is felt. Hold 5 seconds. Repeat 10 times per set. Do 1 sessions per day. (Lying down)        STANDING: Wall Push Up    With arms slightly wider than shoulders, gently lean body to wall. Push body away from wall by straightening elbows.  Keep body straight 10 reps 1x per day,

## 2015-03-15 NOTE — Therapy (Signed)
[facility] [address] [place], [place], [address] Phone: [phone]   Fax:  [phone]  Occupational Therapy Treatment  Patient Details  Name: Mark Gallagher MRN: [id number] Date of Birth: March 12, 1970 No Data Recorded  Encounter Date: 03/15/2015      OT End of Session - 03/15/15 1110    Visit Number 35   Number of Visits 35  31+4=35   Date for OT Re-Evaluation 03/09/15   Authorization Type [employer], 60 visit limit combined   Authorization Time Period renewal completed 02/08/15, week 34   Authorization - Visit Number 7   Authorization - Number of Visits 30   OT Start Time 1102   OT Stop Time 1145   OT Time Calculation (min) 43 min   Activity Tolerance Patient tolerated treatment well   Behavior During Therapy [facility] for tasks assessed/performed      Past Medical History  Diagnosis Date  . Thyroid disease   . Cancer ([place])   . 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 Testosterone deficiency   . Knee pain, bilateral   . History of pituitary tumor     Past Surgical History  Procedure Laterality Date  . Cervical disc surgery  2007    Anterior C4/5 vertebrectomy and c3-C6 laminoplasty with plating  . Craniotomy for tumor      pituitary adenoma    There were no vitals filed for this visit.  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 Yes   Pain Score 1    Pain Location --  between shoulder  blades   Pain Orientation Right;Left   Pain Descriptors / Indicators Aching   Pain Type Chronic pain   Pain Frequency Intermittent   Aggravating Factors  exercise, work    Pain Relieving Factors rest, stretching            [facility] OT Assessment - 03/15/15 0001    AROM   Right Shoulder Flexion 155 Degrees   Left Shoulder Flexion 155 Degrees   Strength   Overall Strength Comments BUE strength grossly 5/5         OT Treatments/Exercises (OP) - 02/15/15 0001    Shoulder Exercises:    Shoulder Flexion AAROM with cane in supine   ABduction AAROM;10 reps  with canein supine       Shoulder Exercises: Seated   Flexion Strengthening;Weights;15 reps;Both  with 5.5.lb ball,   5.5 lb ball, chest press with scapular retraction x15, and diagonals to each side x10 each with BUEs       Shoulder Exercises: Prone   Other Prone Exercises In prone over ball, scapular retraction with 4lb wt with elbow bent, then shoulder ext with 4b wt, and abduction with 3lb wt x10   Checked goals and discussed progress.  Pt able to retrieve and replace 3lb and 4lb objects on overhead shelf with LUE x5 each safely and shoulder ROM/strength grossly WFL.  Arm bike  x59mn (3 min at level 9 and 5 min at level 7) with no rest, (but fatigue with level 9) for conditioning.  Wall push-up x15 with min cues                                            OT Education - 03/15/15 1238    Education Details Added exercises per pt request--see pt instructions   Person(s) Educated Patient   Methods Explanation;Demonstration;Verbal cues;Handout   Comprehension Verbalized understanding;Returned demonstration          OT Short Term Goals - 02/08/15 1148    OT SHORT TERM GOAL #1   Title Pt will verbalize understanding of coordination/hand strength HEP--check STGs 11/08/14   Time 4   Period Weeks   Status Achieved   OT  SHORT TERM GOAL #2   Title Pt will perfom BADLs mod I.   Baseline min A bathing   Time 4   Period Weeks   Status Achieved  11/06/14  supervision for shower transfer, min A for R brace/neck brace;  11/27/14 met   OT SHORT TERM GOAL #3   Title Pt will improve L hand coordination for ADLs as shown by completing 9-hole peg test in less than 55sec.   Baseline 77.34sec   Time 4   Period Weeks   Status Achieved  11/06/14:  56.94sec;  11/27/14  45.69sec   OT SHORT TERM GOAL #4   Title Pt will demo at least 40lbs L grip strength for ADLs/opening containers.   Baseline 30lbs   Time 4   Period Weeks   Status Achieved  11/06/14:  45lbs   OT SHORT TERM GOAL #5   Title Pt will improve L hand coordination for ADLs as shown by completing 9-hole peg test in less than 40sec.--check 12/29/14   Baseline 45.56 secs, 34.12 secs   Time 4   Period Weeks   Status Achieved  02/08/15 38.91sec, 33.19sec   OT SHORT TERM GOAL #6   Title Pt will be able to perform overhead reaching with LUE for at least 137m without rest break for incr activity tolerance.--check 12/29/14   Time 4   Period Weeks   Status Achieved   OT SHORT TERM GOAL #7   Title Pt will demo at least 55lbs L grip strength for ADLs/opening containers.--check 12/29/14   Time 4   Period Weeks   Status Achieved  12/25/14:  49lbs; 01/08/15  60lbs           OT Long Term Goals - 03/15/15 1113    OT LONG TERM GOAL #1   Title Pt will be independent with proximal UE HEP when cleared from cervical/lifting precautions.--check updated LTGs 01/29/15   Status Achieved  11/27/14 pt not yet cleared from cervical precautions, met 02/01/15   OT LONG TERM GOAL #2   Title Pt will perform simple meal prep/home maintenance tasks mod I.   Status Partially Met  not fully met 11/30/14;  02/06/15  only not performing cooking due to fatigue with HEP and return to work (parents performing cooking) but is performing home maintenance tasks   OT LONG TERM GOAL #3    Title Pt will improve L hand coordination for ADLs as shown by completing 9-hole peg test in less than 37sec.   Baseline 47.88secs  11/21/14; 11/27/14  45.69sec  (revised goal to <37 instead of less  than 45sec for renewal)   Status Achieved  02/08/15;  38.91, 33.19sec   OT LONG TERM GOAL #4   Title Pt will demo at least 65lbs L grip strength for ADLs/opening containers.   Baseline revised to 60 for renewal 11/30/14; upgraded to 65lbs 01/08/15 due to goal met   Status Achieved  11/27/14:  49lbs; 12/25/14  49lbs; 01/08/15  met 60lbs   OT LONG TERM GOAL #5   Title Pt will be able to retrieve 2lb object from overhead shelf with LUE.   Time 8   Period Weeks   Status Achieved  01/08/15:  unable to perform with 1lb without compensation (revised goal from 4lbs to 2lbs);  02/08/15  met at approx this level   OT LONG TERM GOAL #6   Title added 01/08/15:  Pt will demo at least 140* L shoulder flexion for functional reaching.   Time 4   Status Achieved  02/06/15   OT LONG TERM GOAL #7   Title Pt will be independent with final HEP--check LTGs 03/09/15   Time 4   Status Achieved  03/15/15   OT LONG TERM GOAL #8   Title Pt will demo at least 150 degrees L shoulder flex for functional reaching.   Baseline 4   Status Achieved  03/15/15   OT LONG TERM GOAL  #9   Baseline Pt will be able to retrieve 3lb object from overhead shelf with LUE without compensation/safely.   Time 4   Period Weeks   Status Achieved  met. 03/15/15 with 4lbs               Plan - 03/15/15 1239    Clinical Impression Statement Pt has made good progress and is appriopriate for OT d/c at this time.  All goals met.   Plan d/c OT   OT Home Exercise Plan Education issued: coordination 10/12/14, cane HEP and yellow band (bicep/triceps); 12/25/14 updated HEP; updated HEP to red band 02/15/15   Consulted and Agree with Plan of Care Patient;Family member/caregiver   Family Member Consulted mother     OCCUPATIONAL THERAPY DISCHARGE  SUMMARY  Visits from Start of Care: see above  Current functional level related to goals / functional outcomes: See above   Remaining deficits: decr balance/functional mobility (PT will continue to address), decr strength/decr activity tolerance (however, UE strength, ROM, and coordination grossly WNL)   Education / Equipment: Pt instructed HEP and verbalized understanding.  Plan: Patient agrees to discharge.  Patient goals were met. Patient is being discharged due to meeting the stated rehab goals.  ?????         Problem List Patient Active Problem List   Diagnosis Date Noted  . Adjustment disorder with depressed mood   . Tetraplegia ([place]) 09/19/2014  . Acute blood loss anemia 09/19/2014  . Cervical spondylosis with myelopathy 09/18/2014  . Radiculopathy of arm 08/28/2014  . Hypogonadism male 09/08/2013  . 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 Cardiomyopathy 04/01/2011  . Wolff-Parkinson-White (WPW) syndrome 04/01/2011    [facility] 03/15/2015, 12:42 PM  [facility] [address] [place], [place], [address] Phone: [phone]   Fax:  [phone]  Name: Mark Gallagher MRN: [id number] Date of Birth: August 25, 1970  Vianne Bulls, OTR/L [facility] [address] [place], [place]  [address] [phone] phone [phone] 03/15/2015 12:42 PM

## 2015-03-15 NOTE — Therapy (Signed)
[facility] [address] [place], [place], [address] Phone: [phone]   Fax:  [phone]  Physical Therapy Treatment  Patient Details  Name: Mark Gallagher MRN: [id number] Date of Birth: 09-Dec-1970 No Data Recorded  Encounter Date: 03/15/2015      PT End of Session - 03/15/15 1024    Visit Number 39   Number of Visits 45   Date for PT Re-Evaluation 04/09/15   Authorization Type [employer]    Authorization - Visit Number 7   Authorization - Number of Visits 30   PT Start Time 1016   PT Stop Time 1100   PT Time Calculation (min) 44 min   Equipment Utilized During Treatment Gait belt   Activity Tolerance Patient tolerated treatment well   Behavior During Therapy [facility] for tasks assessed/performed      Past Medical History  Diagnosis Date  . Thyroid disease   . Cancer ([place])   . 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 Testosterone deficiency   . Knee pain, bilateral   . History of pituitary tumor     Past Surgical History  Procedure Laterality Date  . Cervical disc surgery  2007    Anterior C4/5 vertebrectomy and c3-C6 laminoplasty with plating  . Craniotomy for tumor      pituitary adenoma    There were no vitals filed for this visit.  Visit Diagnosis:  Abnormality of gait  Unsteadiness  Weakness generalized  Decreased coordination      Subjective Assessment - 03/15/15 1021    Subjective "I'm starting to notice that the pain in my shoulders is getting better.  I also think those stretches are helping too for my leg."    Pertinent History * Restrictions: no lifting > 30 lbs; do not work on neck ROM.  PMH: cervical spondylosis with myelopathy; s/p cervical vertebrectomy C4/5 with C3-6 plating 2007 acromegaly s/p pituitary tumor resection (1994); hypogonadism; Wolff-Parkinson-White Syndrome; cardiomyopathy; acute blood loss anemia; adjustment disorder with depressed mood   Patient Stated Goals "I'd like to be able to walk  by myself with no assistance, stand up out of a chair by myself, be able to get up and down stairs without assistance.   Currently in Pain? Yes   Pain Score 1    Pain Location --  between shoulder blades   Pain Orientation Right;Left;Mid   Pain Descriptors / Indicators Aching   Pain Type Chronic pain   Pain Onset 1 to 4 weeks ago   Pain Frequency Intermittent   Aggravating Factors  exercise, work   Pain Relieving Factors rest, stretching   Pain Score 2   Pain Location Leg   Pain Orientation Left   Pain Descriptors / Indicators Burning   Pain Type Acute pain   Pain Onset 1 to 4 weeks ago   Pain Frequency Intermittent   Aggravating Factors  walking   Pain Relieving Factors stretching.            Gait:  Addressed gait over paved outdoor surfaces with SPC.  Performed >500' at mod I level with use of SPC, therefore okayed pt to begin ambulation in community with [facility].  Both pt and mother verbalized understanding.  Also continue to address gait instability while at work over mat surface.  Simulated to best of ability and pt able to return demonstration with [facility] and therefore feel he would be safe to do so at work.  Also assessed pts ability to perform curb step without AD as  he states that he has not attempted this.  Performed with min/mod A with cues on stepping sequence and safety.  Will continue to work on this in future sessions.   NMR:  Addressed gait while negotiating around obstacles to better simulate work set up and experiences.  He was able to do so with SPC at mod I level, again feel that he would be safe to use SPC at work, however pt still fearful and worried about work entrance as it is sloped and uneven.  Educated pt to take picture of set up and that we would attempt to simulate in therapy in order to progress pt towards working only with [facility] in community and work.  Pt and mother verbalized understanding. Also address picking up object from floor.  Performed in two manners  with pt holding cane with cues for wider stance.  Also attempted to perform with cane out to side so that he could hold cane and press onto RLE with RUE while grasping object with LUE.  Performed safely while keeping                        PT Education - 03/15/15 1023    Education provided Yes   Education Details okay to ambulate outdoors on paved surfaces at mod I level.    Person(s) Educated Patient   Methods Explanation   Comprehension Verbalized understanding          PT Short Term Goals - 03/15/15 1025    PT SHORT TERM GOAL #1   Title Pt will perform initial HEP with mod I using paper handout to indicate safe HEP compliance, maximize functional gains made in PT. Target date: 11/06/14   Baseline Met 9/22.   Status Achieved   PT SHORT TERM GOAL #2   Title Perform 6 Minute Walk Test to assess/address functional endurance. Target date: 11/06/14   Baseline 9/20: 556' with RW   Status Achieved   PT SHORT TERM GOAL #3   Title Perform TUG to assess/address efficiency of functional mobility. Target date: 11/06/14   Baseline 9/20: TUG time = 28.3 seconds with RW   Status Achieved   PT SHORT TERM GOAL #4   Title Pt will ambulate 200' over level, indoor surfaces with mod I using LRAD to indicate increased safety/indepedence with household ambulation. Target date: 11/06/14   Baseline Met 10/12 with bariatric RW.   Status Achieved   PT SHORT TERM GOAL #5   Title Pt will negotiate 14 stairs with B rails and min A of single person to indicate increased independence accessing second floor of home. Target date: 11/06/14   Baseline 10/10: required min guard of PT for stairs x16   Status Achieved   PT SHORT TERM GOAL #6   Title Complete Berg Balance Scale and improve score by 4 points from baseline to progress toward significant improvement in functional standing balance. Target date: 01/02/15   Baseline 11/18: Berg score = 34/56   Status Achieved   PT SHORT TERM GOAL #7    Title Pt will improve gait velocity from 1.94 ft/sec to 2.24 ft/sec to indicate increased efficiency of ambulation. Target date: 01/02/15   Baseline 11/15: 1.94 ft/sec using LBQC      12/15: 2.54 ft/sec with [facility] Show Low   Status Achieved   PT SHORT TERM GOAL #8   Title Pt will ambulate 30' over level, indoor surfaces without AD requiring supervision to progress toward independence with household mobility. Target  date: 01/02/15   Baseline 12/15: ambulated x115' without AD with supervision   Status Achieved   PT SHORT TERM GOAL #9   TITLE Pt will ambulate 150' over paved, outdoor surfaces without AD requiring supervision to indicate increased independence with community ambulation. Target date: 03/08/15   Baseline requires min/guard on 03/08/15 (due to increased pain in L leg)   Time 4   Period Weeks   Status Not Met   PT SHORT TERM GOAL #10   TITLE TUG manual time will be within 40% of TUG normal time to indicate increased efficiency of mobility while dual-tasking. Target date: 03/08/15   Baseline Original TUG was 16.78 and manual TUG was approx 24 secs on 03/08/15   Time 4   Period Weeks   Status Not Met   PT SHORT TERM GOAL #11   TITLE Pt will ambulate x100' over indoor solid/compliant surfaces and around 10 obstacles with mod I using SPC to indicate increased safety using cane in work environment. Target date: 03/08/15   Baseline met 03/15/15   Time 4   Period Weeks   Status Achieved           PT Long Term Goals - 02/08/15 1332    PT LONG TERM GOAL #1   Title Pt will improve 6 Minute Walk distance by 150' from baseline to indicate significant improvement in functional endurance. Target date: 12/04/14   Baseline 9/20: baseline 6MWT distance = 556' with RW;  11/15: 6MWT distance = 910 ' with RW   Status Achieved   PT LONG TERM GOAL #2   Title Pt will improve TUG score by 10.8 seconds from baseline to indicate significant improvement in efficiency of functional mobility.  Target date: 12/04/14    Baseline 9/20: baseline TUG = 28.4 sec with RW;  11/15: TUG = 17.67 with RW   Status Achieved   PT LONG TERM GOAL #3   Title Pt will ambulate 500' over unlevel, paved surfaces with mod I using LRAD to indicate increased stability/independence with community mobility. Target date: 12/04/14   Baseline Met 11/10 with LBQC.   Status Achieved   PT LONG TERM GOAL #4   Title Pt will negotiate standard ramp and curb step with mod I using LRAD to indicate increased safety/indepedence traversing community obstacles. Target date: 12/04/14   Baseline Met 11/10 with LBQC.   Status Achieved   PT LONG TERM GOAL #5   Title Pt will negotiate 14 stairs with 2 rails and mod I to indicate increased independence accessing second floor of home.  Target date: 12/04/14   Baseline Met 11/27/14.   Status Achieved   Additional Long Term Goals   Additional Long Term Goals Yes   PT LONG TERM GOAL #6   Title Pt will improve Berg score by 8 points from baseline to indicate significant improvement in functional standing balance. Target date: 02/06/15   Baseline 11/18: baseline Berg score = 34/56; 1/12: Berg score = 43/56   Status Achieved   PT LONG TERM GOAL #7   Title Pt will improve gait velocity to > 2.62 ft/sec to indicate status of community ambulator.  Target date: 02/06/15   Baseline 2.78 ft/sec w/ cane on 02/06/15   Status Achieved   PT LONG TERM GOAL #8   Title Pt will perform TUG in < 13.5 seconds to indicate decreased fall risk. Modified target date: 04/05/15   Baseline 17.13 with cane and 16.78 secs without cane 02/06/15  Continue as LTG through  renewed POC.   Time 8   Period Weeks   Status Partially Met   PT LONG TERM GOAL  #9   TITLE Pt will ambulate 24' over level, indoor surfaces without AD with mod I to indicate increased stability/independence with household ambulation.  Target date: 02/06/15   Baseline met 02/06/15    Time 8   Period Weeks   Status Achieved   PT LONG TERM GOAL  #10   TITLE Pt  will ambulate 150' indoors over level and unlevel surfaces of varying heights and compliance with no AD and mod I to demonstrate safety/independence with ambulation during work. Target date: 04/05/15   Time 8   Period Weeks   Status New   PT LONG TERM GOAL  #11   TITLE TUG manual will be within 25% of TUG normal to indicate increased efficiency of mobility while dual-tasking. Target date: 04/05/15   Time 8   Period Weeks   Status New   PT LONG TERM GOAL  #12   TITLE Pt will ambulate 200' over unlevel, paved surfaces with mod I (for increased time, use of orthoses) but without AD to indicate increased independence with community mobility. Target date: 04/05/15   Time 8   Period Weeks   Status New               Plan - 03/15/15 1024    Clinical Impression Statement Skilled session focused on addressing remaining STG.  Note that he has met and feel that he could ambulate with SPC at work, however would like to better simulate work entrance as this seems to be most difficult for pt at this time.  Pt to bring picture to next session.  Also continue to focus on more work and community related mobility with [place].  Tolerated well.     Pt will benefit from skilled therapeutic intervention in order to improve on the following deficits Abnormal gait;Decreased activity tolerance;Decreased balance;Decreased endurance;Decreased coordination;Decreased mobility;Decreased strength;Impaired sensation;Pain   Rehab Potential Good   PT Frequency 1x / week   PT Duration 8 weeks   PT Treatment/Interventions Vestibular;ADLs/Self Care Home Management;DME Instruction;Balance training;Therapeutic exercise;Therapeutic activities;Functional mobility training;Stair training;Gait training;Neuromuscular re-education;Patient/family education;Electrical Stimulation   PT Next Visit Plan  Gait training with SPC and without AD. L hiip strengthening (emphasis on extensors, abductors).   Consulted and Agree with Plan of Care  Patient;Family member/caregiver   Family Member Consulted mother        Problem List Patient Active Problem List   Diagnosis Date Noted  . Adjustment disorder with depressed mood   . Tetraplegia ([place]) 09/19/2014  . Acute blood loss anemia 09/19/2014  . Cervical spondylosis with myelopathy 09/18/2014  . Radiculopathy of arm 08/28/2014  . Hypogonadism male 09/08/2013  . 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 Cardiomyopathy 04/01/2011  . Wolff-Parkinson-White (WPW) syndrome 04/01/2011    Cameron Sprang, PT, MPT [facility] [address] [place], [place], [address] Phone: [phone]   Fax:  [phone] 03/15/2015, 12:28 PM  Name: PEARCE LITTLEFIELD MRN: [id number] Date of Birth: 1970-04-20

## 2015-03-16 ENCOUNTER — Ambulatory Visit (INDEPENDENT_AMBULATORY_CARE_PROVIDER_SITE_OTHER): Payer: Managed Care, Other (non HMO) | Admitting: *Deleted

## 2015-03-16 DIAGNOSIS — E291 Testicular hypofunction: Secondary | ICD-10-CM

## 2015-03-16 MED ORDER — TESTOSTERONE CYPIONATE 200 MG/ML IM SOLN
200.0000 mg | Freq: Once | INTRAMUSCULAR | Status: AC
  Administered 2015-03-16: 200 mg via INTRAMUSCULAR

## 2015-03-20 ENCOUNTER — Ambulatory Visit: Payer: Managed Care, Other (non HMO) | Admitting: Physical Therapy

## 2015-03-20 ENCOUNTER — Encounter: Payer: Managed Care, Other (non HMO) | Admitting: Occupational Therapy

## 2015-03-20 DIAGNOSIS — R531 Weakness: Secondary | ICD-10-CM

## 2015-03-20 DIAGNOSIS — R269 Unspecified abnormalities of gait and mobility: Secondary | ICD-10-CM

## 2015-03-20 DIAGNOSIS — R279 Unspecified lack of coordination: Secondary | ICD-10-CM

## 2015-03-20 DIAGNOSIS — R2681 Unsteadiness on feet: Secondary | ICD-10-CM

## 2015-03-20 DIAGNOSIS — R278 Other lack of coordination: Secondary | ICD-10-CM

## 2015-03-20 NOTE — Therapy (Signed)
[place] [address] [place], [place], [address] Phone: [phone]   Fax:  [phone]  Physical Therapy Treatment  Patient Details  Name: Mark Gallagher MRN: [id number] Date of Birth: 1970-02-21 No Data Recorded  Encounter Date: 03/20/2015      PT End of Session - 03/20/15 1947    Visit Number 40   Number of Visits 45   Date for PT Re-Evaluation 04/09/15   Authorization Type [employer]    Authorization - Visit Number 8   Authorization - Number of Visits 30   PT Start Time 1104   PT Stop Time 1146   PT Time Calculation (min) 42 min   Equipment Utilized During Treatment Gait belt   Activity Tolerance Patient tolerated treatment well   Behavior During Therapy [facility] for tasks assessed/performed      Past Medical History  Diagnosis Date  . Thyroid disease   . Cancer ([place])   . 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 Testosterone deficiency   . Knee pain, bilateral   . History of pituitary tumor     Past Surgical History  Procedure Laterality Date  . Cervical disc surgery  2007    Anterior C4/5 vertebrectomy and c3-C6 laminoplasty with plating  . Craniotomy for tumor      pituitary adenoma    There were no vitals filed for this visit.  Visit Diagnosis:  Abnormality of gait  Unsteadiness  Decreased coordination  Weakness generalized      Subjective Assessment - 03/20/15 1106    Subjective Pt saw cardiologist at [facility] yesterday. MD attempting to find out if another cardiac ablation will be necessary and, if so, will be in L ventrical "and will be a little riskier," per pt. Pt sees neuro/orthopedic surgeon  tomorrow. Pt forgot to bring picture of work entrance today.   Patient is accompained by: Family member  mother   Pertinent History * Restrictions: no lifting > 30 lbs; do not work on neck ROM.  PMH: cervical spondylosis with myelopathy; s/p cervical vertebrectomy C4/5 with C3-6 plating 2007 acromegaly s/p pituitary  tumor resection (1994); hypogonadism; Wolff-Parkinson-White Syndrome; cardiomyopathy; acute blood loss anemia; adjustment disorder with depressed mood   Patient Stated Goals "I'd like to be able to walk by myself with no assistance, stand up out of a chair by myself, be able to get up and down stairs without assistance.   Currently in Pain? Yes   Pain Score 4    Pain Location Other (Comment)  between shoulder blades   Pain Orientation Right;Left   Pain Descriptors / Indicators Aching   Pain Type Chronic pain   Pain Onset 1 to 4 weeks ago   Aggravating Factors  exercise, work   Pain Relieving Factors rest, stretching   Multiple Pain Sites No                         [facility] Adult PT Treatment/Exercise - 03/20/15 0001    Ambulation/Gait   Ambulation/Gait Yes   Ambulation/Gait Assistance 5: Supervision;4: Min guard;4: Min assist   Ambulation/Gait Assistance Details x100' over unlevel, paved surfaces with SPC with supervision, no overt LOB; x200' outdoors without AD with min guard-min A; single episode of L genu recurvatum when ascending ramp.without AD. Gait [phone]' over level, indoor surfaces without AD with close supervision, multimodal cueing for increased lateral weight shift to L side, increased anterior weight shift during RLE advancement, and increased pelvic protraction, and addressing excessive  L trunk shortening during L stance.    Ambulation Distance (Feet) 475 Feet   Assistive device None;Other (Comment);Straight cane  L AFO, L hinged knee brace   Gait Pattern Step-through pattern;Decreased stance time - left;Decreased weight shift to left;Lateral hip instability;Poor foot clearance - left;Left genu recurvatum  L pelvic retraction; L trunk lean during L stance   Ambulation Surface Level;Unlevel;Indoor;Outdoor;Paved   Ramp 4: Min assist  without AD   Ramp Details (indicate cue type and reason) L genu recurvatum when ascending ramp without AD   Curb 4: Min assist    Curb Details (indicate cue type and reason) blocked practice of curb step negotiation without AD x7 consecutive trials with cueing for anterior weight shift, forward gaze during both ascend and descent with effective within-session carryover.   Pre-Gait Activities Gait 5 x10' with single UE support at countetop, cueing for L pelvic protraction, anterior weight shift/lateral weight shift to L during LLE stance. Recommended pt practice at home.   Neuro Re-ed    Neuro Re-ed Details  Without UE support, pt performed LUE anterolateral reaching in L half tall kneeling (L knee on mat table) to promote L pelvic protraction and L gluteus medius/maximus activation. Performed 4 x10 reps.                  PT Short Term Goals - 03/15/15 1025    PT SHORT TERM GOAL #1   Title Pt will perform initial HEP with mod I using paper handout to indicate safe HEP compliance, maximize functional gains made in PT. Target date: 11/06/14   Baseline Met 9/22.   Status Achieved   PT SHORT TERM GOAL #2   Title Perform 6 Minute Walk Test to assess/address functional endurance. Target date: 11/06/14   Baseline 9/20: 556' with RW   Status Achieved   PT SHORT TERM GOAL #3   Title Perform TUG to assess/address efficiency of functional mobility. Target date: 11/06/14   Baseline 9/20: TUG time = 28.3 seconds with RW   Status Achieved   PT SHORT TERM GOAL #4   Title Pt will ambulate 200' over level, indoor surfaces with mod I using LRAD to indicate increased safety/indepedence with household ambulation. Target date: 11/06/14   Baseline Met 10/12 with bariatric RW.   Status Achieved   PT SHORT TERM GOAL #5   Title Pt will negotiate 14 stairs with B rails and min A of single person to indicate increased independence accessing second floor of home. Target date: 11/06/14   Baseline 10/10: required min guard of PT for stairs x16   Status Achieved   PT SHORT TERM GOAL #6   Title Complete Berg Balance Scale and improve  score by 4 points from baseline to progress toward significant improvement in functional standing balance. Target date: 01/02/15   Baseline 11/18: Berg score = 34/56   Status Achieved   PT SHORT TERM GOAL #7   Title Pt will improve gait velocity from 1.94 ft/sec to 2.24 ft/sec to indicate increased efficiency of ambulation. Target date: 01/02/15   Baseline 11/15: 1.94 ft/sec using LBQC      12/15: 2.54 ft/sec with [facility]   Status Achieved   PT SHORT TERM GOAL #8   Title Pt will ambulate 11' over level, indoor surfaces without AD requiring supervision to progress toward independence with household mobility. Target date: 01/02/15   Baseline 12/15: ambulated x115' without AD with supervision   Status Achieved   PT SHORT TERM GOAL #9  TITLE Pt will ambulate 150' over paved, outdoor surfaces without AD requiring supervision to indicate increased independence with community ambulation. Target date: 03/08/15   Baseline requires min/guard on 03/08/15 (due to increased pain in L leg)   Time 4   Period Weeks   Status Not Met   PT SHORT TERM GOAL #10   TITLE TUG manual time will be within 40% of TUG normal time to indicate increased efficiency of mobility while dual-tasking. Target date: 03/08/15   Baseline Original TUG was 16.78 and manual TUG was approx 24 secs on 03/08/15   Time 4   Period Weeks   Status Not Met   PT SHORT TERM GOAL #11   TITLE Pt will ambulate x100' over indoor solid/compliant surfaces and around 10 obstacles with mod I using SPC to indicate increased safety using cane in work environment. Target date: 03/08/15   Baseline met 03/15/15   Time 4   Period Weeks   Status Achieved           PT Long Term Goals - 02/08/15 1332    PT LONG TERM GOAL #1   Title Pt will improve 6 Minute Walk distance by 150' from baseline to indicate significant improvement in functional endurance. Target date: 12/04/14   Baseline 9/20: baseline 6MWT distance = 556' with RW;  11/15: 6MWT distance = 910  ' with RW   Status Achieved   PT LONG TERM GOAL #2   Title Pt will improve TUG score by 10.8 seconds from baseline to indicate significant improvement in efficiency of functional mobility.  Target date: 12/04/14   Baseline 9/20: baseline TUG = 28.4 sec with RW;  11/15: TUG = 17.67 with RW   Status Achieved   PT LONG TERM GOAL #3   Title Pt will ambulate 500' over unlevel, paved surfaces with mod I using LRAD to indicate increased stability/independence with community mobility. Target date: 12/04/14   Baseline Met 11/10 with LBQC.   Status Achieved   PT LONG TERM GOAL #4   Title Pt will negotiate standard ramp and curb step with mod I using LRAD to indicate increased safety/indepedence traversing community obstacles. Target date: 12/04/14   Baseline Met 11/10 with LBQC.   Status Achieved   PT LONG TERM GOAL #5   Title Pt will negotiate 14 stairs with 2 rails and mod I to indicate increased independence accessing second floor of home.  Target date: 12/04/14   Baseline Met 11/27/14.   Status Achieved   Additional Long Term Goals   Additional Long Term Goals Yes   PT LONG TERM GOAL #6   Title Pt will improve Berg score by 8 points from baseline to indicate significant improvement in functional standing balance. Target date: 02/06/15   Baseline 11/18: baseline Berg score = 34/56; 1/12: Berg score = 43/56   Status Achieved   PT LONG TERM GOAL #7   Title Pt will improve gait velocity to > 2.62 ft/sec to indicate status of community ambulator.  Target date: 02/06/15   Baseline 2.78 ft/sec w/ cane on 02/06/15   Status Achieved   PT LONG TERM GOAL #8   Title Pt will perform TUG in < 13.5 seconds to indicate decreased fall risk. Modified target date: 04/05/15   Baseline 17.13 with cane and 16.78 secs without cane 02/06/15  Continue as LTG through renewed POC.   Time 8   Period Weeks   Status Partially Met   PT LONG TERM GOAL  #9  TITLE Pt will ambulate 48' over level, indoor surfaces without AD  with mod I to indicate increased stability/independence with household ambulation.  Target date: 02/06/15   Baseline met 02/06/15    Time 8   Period Weeks   Status Achieved   PT LONG TERM GOAL  #10   TITLE Pt will ambulate 150' indoors over level and unlevel surfaces of varying heights and compliance with no AD and mod I to demonstrate safety/independence with ambulation during work. Target date: 04/05/15   Time 8   Period Weeks   Status New   PT LONG TERM GOAL  #11   TITLE TUG manual will be within 25% of TUG normal to indicate increased efficiency of mobility while dual-tasking. Target date: 04/05/15   Time 8   Period Weeks   Status New   PT LONG TERM GOAL  #12   TITLE Pt will ambulate 200' over unlevel, paved surfaces with mod I (for increased time, use of orthoses) but without AD to indicate increased independence with community mobility. Target date: 04/05/15   Time 8   Period Weeks   Status New               Plan - 03/20/15 1947    Clinical Impression Statement Session focused on increasing pt independence with commnuity mobility using SPC, gait training without SPC. Specifically addressed L stance stability with emphasis on anterior weight shift and L pelvic protraction, and addressing L trunk shortening during L stance.   Pt will benefit from skilled therapeutic intervention in order to improve on the following deficits Abnormal gait;Decreased activity tolerance;Decreased balance;Decreased endurance;Decreased coordination;Decreased mobility;Decreased strength;Impaired sensation;Pain   Rehab Potential Good   PT Frequency 1x / week   PT Duration 8 weeks   PT Treatment/Interventions Vestibular;ADLs/Self Care Home Management;DME Instruction;Balance training;Therapeutic exercise;Therapeutic activities;Functional mobility training;Stair training;Gait training;Neuromuscular re-education;Patient/family education;Electrical Stimulation   PT Next Visit Plan Continue gait training  without AD. Increase L stance stability with emphasis on L pelvic protraction, anterior weight shift; L hip strengthening (emphasis on extensors, abductors).  ** Consider extending POC due to pt having missed week of PT due to flu.   Consulted and Agree with Plan of Care Patient;Family member/caregiver   Family Member Consulted mother        Problem List Patient Active Problem List   Diagnosis Date Noted  . Adjustment disorder with depressed mood   . Tetraplegia ([place]) 09/19/2014  . Acute blood loss anemia 09/19/2014  . Cervical spondylosis with myelopathy 09/18/2014  . Radiculopathy of arm 08/28/2014  . Hypogonadism male 09/08/2013  . 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 Cardiomyopathy 04/01/2011  . Wolff-Parkinson-White (WPW) syndrome 04/01/2011    Billie Ruddy, PT, DPT [facility] [address] [place], [place], [address] Phone: [phone]   Fax:  [phone] 03/20/2015, 7:56 PM  Name: Mark Gallagher MRN: [id number] Date of Birth: 10-Nov-1970

## 2015-03-27 ENCOUNTER — Encounter
Payer: Managed Care, Other (non HMO) | Attending: Physical Medicine & Rehabilitation | Admitting: Physical Medicine & Rehabilitation

## 2015-03-27 ENCOUNTER — Encounter: Payer: Self-pay | Admitting: Physical Medicine & Rehabilitation

## 2015-03-27 VITALS — BP 110/63 | HR 55 | Resp 14

## 2015-03-27 DIAGNOSIS — M25561 Pain in right knee: Secondary | ICD-10-CM | POA: Insufficient documentation

## 2015-03-27 DIAGNOSIS — H8113 Benign paroxysmal vertigo, bilateral: Secondary | ICD-10-CM | POA: Diagnosis not present

## 2015-03-27 DIAGNOSIS — E079 Disorder of thyroid, unspecified: Secondary | ICD-10-CM | POA: Diagnosis not present

## 2015-03-27 DIAGNOSIS — M21372 Foot drop, left foot: Secondary | ICD-10-CM | POA: Insufficient documentation

## 2015-03-27 DIAGNOSIS — R202 Paresthesia of skin: Secondary | ICD-10-CM | POA: Insufficient documentation

## 2015-03-27 DIAGNOSIS — K5900 Constipation, unspecified: Secondary | ICD-10-CM | POA: Diagnosis not present

## 2015-03-27 DIAGNOSIS — R296 Repeated falls: Secondary | ICD-10-CM | POA: Diagnosis not present

## 2015-03-27 DIAGNOSIS — M25562 Pain in left knee: Secondary | ICD-10-CM | POA: Diagnosis not present

## 2015-03-27 DIAGNOSIS — F4321 Adjustment disorder with depressed mood: Secondary | ICD-10-CM | POA: Diagnosis not present

## 2015-03-27 DIAGNOSIS — M4802 Spinal stenosis, cervical region: Secondary | ICD-10-CM | POA: Insufficient documentation

## 2015-03-27 DIAGNOSIS — E220 Acromegaly and pituitary gigantism: Secondary | ICD-10-CM | POA: Diagnosis not present

## 2015-03-27 DIAGNOSIS — R42 Dizziness and giddiness: Secondary | ICD-10-CM | POA: Insufficient documentation

## 2015-03-27 DIAGNOSIS — R269 Unspecified abnormalities of gait and mobility: Secondary | ICD-10-CM | POA: Insufficient documentation

## 2015-03-27 DIAGNOSIS — M4804 Spinal stenosis, thoracic region: Secondary | ICD-10-CM | POA: Insufficient documentation

## 2015-03-27 DIAGNOSIS — D352 Benign neoplasm of pituitary gland: Secondary | ICD-10-CM | POA: Diagnosis not present

## 2015-03-27 DIAGNOSIS — C801 Malignant (primary) neoplasm, unspecified: Secondary | ICD-10-CM | POA: Insufficient documentation

## 2015-03-27 DIAGNOSIS — Z79899 Other long term (current) drug therapy: Secondary | ICD-10-CM | POA: Insufficient documentation

## 2015-03-27 DIAGNOSIS — M25362 Other instability, left knee: Secondary | ICD-10-CM | POA: Insufficient documentation

## 2015-03-27 DIAGNOSIS — R531 Weakness: Secondary | ICD-10-CM | POA: Diagnosis not present

## 2015-03-27 DIAGNOSIS — G8250 Quadriplegia, unspecified: Secondary | ICD-10-CM

## 2015-03-27 DIAGNOSIS — M4712 Other spondylosis with myelopathy, cervical region: Secondary | ICD-10-CM | POA: Diagnosis not present

## 2015-03-27 NOTE — Progress Notes (Signed)
Subjective:    Patient ID: Mark Gallagher, male    DOB: Jan 03, 1971, 45 y.o.   MRN: [id number]  HPI   Mr. Brincks is here in follow up of his C6 myelopathy. He is winding down with outpt therapies. He is doing PT once per week. Work is going ok. He's working 40 hours per week. He is still having some fatigue as well. He sees endocrinologist in June. He continues on tetosterone supplementation as well thyroid and GH supplement.   He hasn't had any falls. His pain seems well controlled. His goal is to get strong enough to go to the beach. He is concerned that he will not make it by the late spring/early summer.        Pain Inventory Average Pain 3 Pain Right Now 2 My pain is dull and aching  In the last 24 hours, has pain interfered with the following? General activity 1 Relation with others 0 Enjoyment of life 2 What TIME of day is your pain at its worst? night Sleep (in general) Good  Pain is worse with: bending and standing Pain improves with: na Relief from Meds: na  Mobility use a cane how many minutes can you walk? 8 ability to climb steps?  yes do you drive?  yes  Function employed # of hrs/week 40  Neuro/Psych weakness numbness  Prior Studies Any changes since last visit?  no  Physicians involved in your care Any changes since last visit?  no   Family History  Problem Relation Age of Onset  . High blood pressure Mother   . Acute myelogenous leukemia Brother    Social History   Social History  . Marital Status: Single    Spouse Name: N/A  . Number of Children: N/A  . Years of Education: N/A   Social History Main Topics  . Smoking status: Never Smoker   . Smokeless tobacco: None  . Alcohol Use: No  . Drug Use: No  . Sexual Activity: Not Asked   Other Topics Concern  . None   Social History Narrative   Past Surgical History  Procedure Laterality Date  . Cervical disc surgery  2007    Anterior C4/5 vertebrectomy and c3-C6 laminoplasty  with plating  . Craniotomy for tumor      pituitary adenoma   Past Medical History  Diagnosis Date  . Thyroid disease   . Cancer ([place])   . 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 Testosterone deficiency   . Knee pain, bilateral   . History of pituitary tumor    BP 110/63 mmHg  Pulse 55  Resp 14  Opioid Risk Score:   Fall Risk Score:  `1  Depression screen PHQ 2/9  Depression screen [facility] 2/9 03/27/2015 02/18/2015 01/23/2015 10/19/2014 08/23/2014 09/08/2013  Decreased Interest 0 0 0 0 0 0  Down, Depressed, Hopeless 0 0 0 0 0 0  PHQ - 2 Score 0 0 0 0 0 0     Review of Systems  All other systems reviewed and are negative.      Objective:   Physical Exam  Constitutional: He is oriented to person, place, and time. He appears well-developed and well-nourished. Large frame/head  HENT: thrush on tongue  Head: Atraumatic.  Eyes: Conjunctivae and EOM are normal. Pupils are equal, round, and reactive to light. Right eye exhibits no discharge.  Neck:  Cervical collar fitting appropriately. New pads  Cardiovascular: Normal rate and regular rhythm.  No murmur heard.  Respiratory: Effort  normal and breath sounds normal. No respiratory distress. He has no wheezes. He exhibits no tenderness.  GI: Soft. Bowel sounds are normal. He exhibits no distension. There is no tenderness.  Musculoskeletal: He exhibits improving edema (trace pedal edema bilaterally---none in legs). no edema left hand Pain over left pec with palpation is improved Neurological: He is alert and oriented to person, place, and time. RUE: 5/5 deltoid, bicep, tricep, HI. LUE: 4+ to 5/5 delt, bicep, tricep, 4+-5wrist and HI. Positive left pronator drift. Sensation 1+/2 LUE and LLE. RLE: 4 HF, 4 to 4+/5 KE and ADF/APF. LLE: 4+HF and KE and 1+ to 2/5 ADF/APF. Sensation 1++/2. Excessive left pelvic tilt with stance Skin: Skin is warm and dry.  Psychiatric: He has a normal mood and affect. His behavior is normal. Judgment and thought content normal.    Assessment & Plan:   Medical Problem List and Plan:  1. Functional deficits secondary to C6 SCI with incomplete myelopathy  -continue with outpt therapies to completion -left knee brace to for support -quad cane for gait---continue exercising   -reviewed the importance of proper mechanics moving forward. 3. Pain Management: [facility]LEVE only.  4. Hypogonadism: Depotestosterone per home regimen--needs to check with endocrine about labwork. May need adjustment in thyroid or testosterone supplementation. 13. H/o Acromegaly: Treated with Sandostatin?  14. Dysphagia: -resolved  15. Edema: Resolved  16. Vertigo: resolved with therapy and HEP  Follow up with me in 6 months. 75minutes of face to face patient care time were spent during this visit. All questions were encouraged and answered.

## 2015-03-28 ENCOUNTER — Ambulatory Visit (INDEPENDENT_AMBULATORY_CARE_PROVIDER_SITE_OTHER): Payer: Managed Care, Other (non HMO) | Admitting: *Deleted

## 2015-03-28 DIAGNOSIS — E291 Testicular hypofunction: Secondary | ICD-10-CM

## 2015-03-28 DIAGNOSIS — E220 Acromegaly and pituitary gigantism: Secondary | ICD-10-CM

## 2015-03-28 MED ORDER — OCTREOTIDE ACETATE 30 MG IM KIT
30.0000 mg | PACK | Freq: Once | INTRAMUSCULAR | Status: AC
  Administered 2015-03-28: 30 mg via INTRAMUSCULAR

## 2015-03-28 NOTE — Progress Notes (Signed)
   Subjective:    Patient ID: Mark Gallagher, male    DOB: 11-22-1970, 45 y.o.   MRN: [id number]  HPI pt here for Testosterone CYP, 200 mg/ml injection every 14 days and Sandostatin [place] 30 mg, monthly injection.    Review of Systems     Objective:   Physical Exam        Assessment & Plan:

## 2015-03-29 ENCOUNTER — Ambulatory Visit: Payer: Managed Care, Other (non HMO) | Attending: Physical Medicine & Rehabilitation | Admitting: Rehabilitation

## 2015-03-29 DIAGNOSIS — M21372 Foot drop, left foot: Secondary | ICD-10-CM | POA: Diagnosis present

## 2015-03-29 DIAGNOSIS — R2681 Unsteadiness on feet: Secondary | ICD-10-CM

## 2015-03-29 DIAGNOSIS — R531 Weakness: Secondary | ICD-10-CM | POA: Insufficient documentation

## 2015-03-29 DIAGNOSIS — R269 Unspecified abnormalities of gait and mobility: Secondary | ICD-10-CM | POA: Diagnosis not present

## 2015-03-29 NOTE — Therapy (Signed)
[facility] [address] [place], [place], [address] Phone: [phone]   Fax:  [phone]  Physical Therapy Treatment  Patient Details  Name: Mark Gallagher MRN: [id number] Date of Birth: 06-Mar-1970 No Data Recorded  Encounter Date: 03/29/2015      PT End of Session - 03/29/15 1112    Visit Number 41   Number of Visits 45   Date for PT Re-Evaluation 04/09/15   Authorization Type [employer]    Authorization - Visit Number 9   Authorization - Number of Visits 30   PT Start Time 1100   PT Stop Time 1148   PT Time Calculation (min) 48 min   Equipment Utilized During Treatment Gait belt   Activity Tolerance Patient tolerated treatment well   Behavior During Therapy [facility] for tasks assessed/performed      Past Medical History  Diagnosis Date  . Thyroid disease   . Cancer ([place])   . 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 Testosterone deficiency   . Knee pain, bilateral   . History of pituitary tumor     Past Surgical History  Procedure Laterality Date  . Cervical disc surgery  2007    Anterior C4/5 vertebrectomy and c3-C6 laminoplasty with plating  . Craniotomy for tumor      pituitary adenoma    There were no vitals filed for this visit.  Visit Diagnosis:  Abnormality of gait  Unsteadiness  Weakness generalized      Subjective Assessment - 03/29/15 1108    Subjective Went to the neurologist and said he was pleased and would follow up with him in 6 months.  He also saw Dr. Naaman Plummer and is also pleased and would follow up in 3 months.     Pertinent History * Restrictions: no lifting > 30 lbs; do not work on neck ROM.  PMH: cervical spondylosis with myelopathy; s/p cervical vertebrectomy C4/5 with C3-6 plating 2007 acromegaly s/p pituitary tumor resection (1994); hypogonadism; Wolff-Parkinson-White Syndrome; cardiomyopathy; acute blood loss anemia; adjustment disorder with depressed mood   Patient Stated Goals "I'd like to be  able to walk by myself with no assistance, stand up out of a chair by myself, be able to get up and down stairs without assistance.   Currently in Pain? Yes   Pain Score 3    Pain Location Shoulder  shoulder blades   Pain Orientation Right;Left   Pain Descriptors / Indicators Aching   Pain Type Chronic pain   Pain Onset 1 to 4 weeks ago   Pain Frequency Intermittent   Aggravating Factors  exercise, work   Pain Relieving Factors rest, stretching   Effect of Pain on Daily Activities PT will monitor but will not directly address pain due to nature of pain              Gait: Addressed gait over outdoor surfaces, grass, negotiation up/down curb and uneven surfaces to better simulate return to community and work environment.  Performed gait over paved and uneven surfaces with SPC at mod I level, therefore educated pt to begin gait to/from and at work with [facility].  Pt and mother verbalized understanding.  Also initiated gait over grassy surfaces to increase challenge and work towards pt goals of walking over sand at [employer] in September.  Pt able to ambulate over grass at close S level.  Again, provided encouragement and education on practicing this at home and community with mother or father providing S working towards increased independence.  Pt and mother verbalized understanding.  Set up red mats with weights underneath to better simulate greatly uneven surfaces (sand).  Pt able to ambulate with SPC and braces at S to min/guard level. Discussed that pt would likely have to ambulate to beach (where he was going to sit) with SPC and braces, but could then remove to sit and relax, however cautioned against going into ocean as this would make pt very unstable, esp without braces or AD to rely on for support.   Both verbalized understanding.    NMR:  Ended session with tall kneeling to R half kneeling in order to address postural control, improved L lateral weight shift, and increased L hip protraction.   Requires +2A (for safety only) to get into position.  Once in half kneeling, had pt perform L lateral and superior reaching to increase weight shift and stretch in LLE.  Tolerated well with mild c/o fatigue at end of session.                     PT Education - 03/29/15 1111    Education provided Yes   Education Details education on upcoming D/C, challening himself at home, using SPC at all times at work, beginning to work on gait with [place] in grass, practicing gait without AD over paved surface with S.    Person(s) Educated Patient   Methods Explanation   Comprehension Verbalized understanding          PT Short Term Goals - 03/15/15 1025    PT SHORT TERM GOAL #1   Title Pt will perform initial HEP with mod I using paper handout to indicate safe HEP compliance, maximize functional gains made in PT. Target date: 11/06/14   Baseline Met 9/22.   Status Achieved   PT SHORT TERM GOAL #2   Title Perform 6 Minute Walk Test to assess/address functional endurance. Target date: 11/06/14   Baseline 9/20: 556' with RW   Status Achieved   PT SHORT TERM GOAL #3   Title Perform TUG to assess/address efficiency of functional mobility. Target date: 11/06/14   Baseline 9/20: TUG time = 28.3 seconds with RW   Status Achieved   PT SHORT TERM GOAL #4   Title Pt will ambulate 200' over level, indoor surfaces with mod I using LRAD to indicate increased safety/indepedence with household ambulation. Target date: 11/06/14   Baseline Met 10/12 with bariatric RW.   Status Achieved   PT SHORT TERM GOAL #5   Title Pt will negotiate 14 stairs with B rails and min A of single person to indicate increased independence accessing second floor of home. Target date: 11/06/14   Baseline 10/10: required min guard of PT for stairs x16   Status Achieved   PT SHORT TERM GOAL #6   Title Complete Berg Balance Scale and improve score by 4 points from baseline to progress toward significant improvement in  functional standing balance. Target date: 01/02/15   Baseline 11/18: Berg score = 34/56   Status Achieved   PT SHORT TERM GOAL #7   Title Pt will improve gait velocity from 1.94 ft/sec to 2.24 ft/sec to indicate increased efficiency of ambulation. Target date: 01/02/15   Baseline 11/15: 1.94 ft/sec using LBQC      12/15: 2.54 ft/sec with [facility]   Status Achieved   PT SHORT TERM GOAL #8   Title Pt will ambulate 25' over level, indoor surfaces without AD requiring supervision to progress toward independence with household  mobility. Target date: 01/02/15   Baseline 12/15: ambulated x115' without AD with supervision   Status Achieved   PT SHORT TERM GOAL #9   TITLE Pt will ambulate 150' over paved, outdoor surfaces without AD requiring supervision to indicate increased independence with community ambulation. Target date: 03/08/15   Baseline requires min/guard on 03/08/15 (due to increased pain in L leg)   Time 4   Period Weeks   Status Not Met   PT SHORT TERM GOAL #10   TITLE TUG manual time will be within 40% of TUG normal time to indicate increased efficiency of mobility while dual-tasking. Target date: 03/08/15   Baseline Original TUG was 16.78 and manual TUG was approx 24 secs on 03/08/15   Time 4   Period Weeks   Status Not Met   PT SHORT TERM GOAL #11   TITLE Pt will ambulate x100' over indoor solid/compliant surfaces and around 10 obstacles with mod I using SPC to indicate increased safety using cane in work environment. Target date: 03/08/15   Baseline met 03/15/15   Time 4   Period Weeks   Status Achieved           PT Long Term Goals - 02/08/15 1332    PT LONG TERM GOAL #1   Title Pt will improve 6 Minute Walk distance by 150' from baseline to indicate significant improvement in functional endurance. Target date: 12/04/14   Baseline 9/20: baseline 6MWT distance = 556' with RW;  11/15: 6MWT distance = 910 ' with RW   Status Achieved   PT LONG TERM GOAL #2   Title Pt will improve  TUG score by 10.8 seconds from baseline to indicate significant improvement in efficiency of functional mobility.  Target date: 12/04/14   Baseline 9/20: baseline TUG = 28.4 sec with RW;  11/15: TUG = 17.67 with RW   Status Achieved   PT LONG TERM GOAL #3   Title Pt will ambulate 500' over unlevel, paved surfaces with mod I using LRAD to indicate increased stability/independence with community mobility. Target date: 12/04/14   Baseline Met 11/10 with LBQC.   Status Achieved   PT LONG TERM GOAL #4   Title Pt will negotiate standard ramp and curb step with mod I using LRAD to indicate increased safety/indepedence traversing community obstacles. Target date: 12/04/14   Baseline Met 11/10 with [facility]QC.   Status Achieved   PT LONG TERM GOAL #5   Title Pt will negotiate 14 stairs with 2 rails and mod I to indicate increased independence accessing second floor of home.  Target date: 12/04/14   Baseline Met 11/27/14.   Status Achieved   Additional Long Term Goals   Additional Long Term Goals Yes   PT LONG TERM GOAL #6   Title Pt will improve Berg score by 8 points from baseline to indicate significant improvement in functional standing balance. Target date: 02/06/15   Baseline 11/18: baseline Berg score = 34/56; 1/12: Berg score = 43/56   Status Achieved   PT LONG TERM GOAL #7   Title Pt will improve gait velocity to > 2.62 ft/sec to indicate status of community ambulator.  Target date: 02/06/15   Baseline 2.78 ft/sec w/ cane on 02/06/15   Status Achieved   PT LONG TERM GOAL #8   Title Pt will perform TUG in < 13.5 seconds to indicate decreased fall risk. Modified target date: 04/05/15   Baseline 17.13 with cane and 16.78 secs without cane 02/06/15  Continue as  LTG through renewed POC.   Time 8   Period Weeks   Status Partially Met   PT LONG TERM GOAL  #9   TITLE Pt will ambulate 36' over level, indoor surfaces without AD with mod I to indicate increased stability/independence with household  ambulation.  Target date: 02/06/15   Baseline met 02/06/15    Time 8   Period Weeks   Status Achieved   PT LONG TERM GOAL  #10   TITLE Pt will ambulate 150' indoors over level and unlevel surfaces of varying heights and compliance with no AD and mod I to demonstrate safety/independence with ambulation during work. Target date: 04/05/15   Time 8   Period Weeks   Status New   PT LONG TERM GOAL  #11   TITLE TUG manual will be within 25% of TUG normal to indicate increased efficiency of mobility while dual-tasking. Target date: 04/05/15   Time 8   Period Weeks   Status New   PT LONG TERM GOAL  #12   TITLE Pt will ambulate 200' over unlevel, paved surfaces with mod I (for increased time, use of orthoses) but without AD to indicate increased independence with community mobility. Target date: 04/05/15   Time 8   Period Weeks   Status New               Plan - 03/29/15 1303    Clinical Impression Statement Skilled session focused on increasing independence with gait in community with [facility], educated to begin gait at work with [facility] vs quad cane.  Also continue to address high level balance and simulation of ambulaiton over varying surfaces such as sand (pts goal to go to beach).  Also began discussion of D/C in two weeks.  Pt and mother verbalized understanding.    Pt will benefit from skilled therapeutic intervention in order to improve on the following deficits Abnormal gait;Decreased activity tolerance;Decreased balance;Decreased endurance;Decreased coordination;Decreased mobility;Decreased strength;Impaired sensation;Pain   Rehab Potential Good   PT Frequency 1x / week   PT Duration 8 weeks   PT Treatment/Interventions Vestibular;ADLs/Self Care Home Management;DME Instruction;Balance training;Therapeutic exercise;Therapeutic activities;Functional mobility training;Stair training;Gait training;Neuromuscular re-education;Patient/family education;Electrical Stimulation   PT Next Visit Plan  Conitnue working towards LTG's, maybe look at gait without brace(s)? to assess stability for gait on beach.     Consulted and Agree with Plan of Care Patient;Family member/caregiver   Family Member Consulted mother        Problem List Patient Active Problem List   Diagnosis Date Noted  . Adjustment disorder with depressed mood   . Tetraplegia ([place]) 09/19/2014  . Acute blood loss anemia 09/19/2014  . Cervical spondylosis with myelopathy 09/18/2014  . Radiculopathy of arm 08/28/2014  . Hypogonadism male 09/08/2013  . 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 Cardiomyopathy 04/01/2011  . Wolff-Parkinson-White (WPW) syndrome 04/01/2011    Cameron Sprang, PT, MPT [facility] [address] [place], [place], [address] Phone: [phone]   Fax:  [phone] 03/29/2015, 1:06 PM  Name: Mark Gallagher MRN: [id number] Date of Birth: 1970-07-27

## 2015-04-05 ENCOUNTER — Ambulatory Visit: Payer: Managed Care, Other (non HMO) | Admitting: Physical Therapy

## 2015-04-05 DIAGNOSIS — R531 Weakness: Secondary | ICD-10-CM

## 2015-04-05 DIAGNOSIS — R2681 Unsteadiness on feet: Secondary | ICD-10-CM

## 2015-04-05 DIAGNOSIS — R269 Unspecified abnormalities of gait and mobility: Secondary | ICD-10-CM

## 2015-04-05 NOTE — Therapy (Signed)
[facility] [address] [place], [place], [address] Phone: [phone]   Fax:  [phone]  Physical Therapy Treatment  Patient Details  Name: Mark Gallagher MRN: [id number] Date of Birth: [birth year] No Data Recorded  Encounter Date: 04/05/2015      PT End of Session - 04/05/15 1205    Visit Number [id number]   Number of Visits 45   Date for PT Re-Evaluation 04/21/15  Extended 12 days, as pt did not attend PT from 1/27 - 02/27/15 due to illness.   Authorization Type [employer]    Authorization - Visit Number 10   Authorization - Number of Visits 30   PT Start Time 4401   PT Stop Time 1153   PT Time Calculation (min) 45 min   Equipment Utilized During Treatment Gait belt   Activity Tolerance Patient tolerated treatment well   Behavior During Therapy WFL for tasks assessed/performed      Past Medical History  Diagnosis Date  . Thyroid disease   . Cancer ([place])   . 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 Testosterone deficiency   . Knee pain, bilateral   . History of pituitary tumor     Past Surgical History  Procedure Laterality Date  . Cervical disc surgery  2007    Anterior C4/5 vertebrectomy and c3-C6 laminoplasty with plating  . Craniotomy for tumor      pituitary adenoma    There were no vitals filed for this visit.  Visit Diagnosis:  Abnormality of gait  Unsteadiness  Weakness generalized      Subjective Assessment - 04/05/15 1118    Subjective Pt reports he has been working a lot. "45 hours this week already," per pt. Reports he has pain in L hip radiating into L posterior/lateral LE at times (worst after prolonged standing/walking). However, this pain was not reproduced during this session,   Patient is accompained by: Family member  mother   Pertinent History * Restrictions: no lifting > 30 lbs; do not work on neck ROM.  PMH: cervical spondylosis with myelopathy; s/p cervical vertebrectomy C4/5 with C3-6 plating 2007  acromegaly s/p pituitary tumor resection (1994); hypogonadism; Wolff-Parkinson-White Syndrome; cardiomyopathy; acute blood loss anemia; adjustment disorder with depressed mood   Patient Stated Goals "I'd like to be able to walk by myself with no assistance, stand up out of a chair by myself, be able to get up and down stairs without assistance.   Currently in Pain? Yes   Pain Score 2    Pain Location Other (Comment)  between shoulder blades   Pain Orientation Right;Left   Pain Descriptors / Indicators Aching   Pain Type Chronic pain   Pain Onset 1 to 4 weeks ago   Pain Frequency Intermittent   Aggravating Factors  exercises, work   Pain Relieving Factors rest, stretching   Multiple Pain Sites No                         [facility] Adult PT Treatment/Exercise - 04/05/15 0001    Ambulation/Gait   Ambulation/Gait Yes   Ambulation/Gait Assistance 5: Supervision;4: Min guard;4: Min assist   Ambulation/Gait Assistance Details x115' without AD using L Blue Rocker AFO with no overt LOB, slight L genu recurvatum, increased L lateral  hip instability during L stance, and increased L trunk shortening during stance. Gait x210' with SPC without L AFO with significant decrease in L genu recurvatum but min A required due to L  toe catch x1; also noted increased L forefoot supination, placing pt at risk of L ankle injury. Gait x210' with SPC with L Foot Up brace with noted improvement in L foot clearance.   Ambulation Distance (Feet) 535 Feet   Assistive device None;Other (Comment);Straight cane  L AFO then L FootUp brace;  L hinged knee brace   Gait Pattern Step-through pattern;Decreased stance time - left;Decreased weight shift to left;Lateral hip instability;Poor foot clearance - left;Left genu recurvatum  L pelvic retraction; L trunk lean during L stance   Ambulation Surface Level;Indoor   Self-Care   Other Self-Care Comments  Again discussed upcoming D/C (last session 3/30). Pt now safe to  transition from use of L FootUp brace as opposed to L [employer], as able, but not safe to ambulate without one of these 2 braces due to concerns with L ankle instability and L toe catch without brace. Provided paper handout on ordering brace.                PT Education - 04/05/15 1209    Education provided Yes   Education Details Upcoming D/C (3/30), brace recommendations. See Self Care for further detail.   Person(s) Educated Patient;Parent(s)  mother   Methods Explanation;Handout   Comprehension Verbalized understanding          PT Short Term Goals - 03/15/15 1025    PT SHORT TERM GOAL #1   Title Pt will perform initial HEP with mod I using paper handout to indicate safe HEP compliance, maximize functional gains made in PT. Target date: 11/06/14   Baseline Met 9/22.   Status Achieved   PT SHORT TERM GOAL #2   Title Perform 6 Minute Walk Test to assess/address functional endurance. Target date: 11/06/14   Baseline 9/20: 556' with RW   Status Achieved   PT SHORT TERM GOAL #3   Title Perform TUG to assess/address efficiency of functional mobility. Target date: 11/06/14   Baseline 9/20: TUG time = 28.3 seconds with RW   Status Achieved   PT SHORT TERM GOAL #4   Title Pt will ambulate 200' over level, indoor surfaces with mod I using LRAD to indicate increased safety/indepedence with household ambulation. Target date: 11/06/14   Baseline Met 10/12 with bariatric RW.   Status Achieved   PT SHORT TERM GOAL #5   Title Pt will negotiate 14 stairs with B rails and min A of single person to indicate increased independence accessing second floor of home. Target date: 11/06/14   Baseline 10/10: required min guard of PT for stairs x16   Status Achieved   PT SHORT TERM GOAL #6   Title Complete Berg Balance Scale and improve score by 4 points from baseline to progress toward significant improvement in functional standing balance. Target date: 01/02/15   Baseline 11/18: Berg score  = 34/56   Status Achieved   PT SHORT TERM GOAL #7   Title Pt will improve gait velocity from 1.94 ft/sec to 2.24 ft/sec to indicate increased efficiency of ambulation. Target date: 01/02/15   Baseline 11/15: 1.94 ft/sec using [facility]      12/15: 2.54 ft/sec with [facility]   Status Achieved   PT SHORT TERM GOAL #8   Title Pt will ambulate 55' over level, indoor surfaces without AD requiring supervision to progress toward independence with household mobility. Target date: 01/02/15   Baseline 12/15: ambulated x115' without AD with supervision   Status Achieved   PT SHORT TERM GOAL #9  TITLE Pt will ambulate 150' over paved, outdoor surfaces without AD requiring supervision to indicate increased independence with community ambulation. Target date: 03/08/15   Baseline requires min/guard on 03/08/15 (due to increased pain in L leg)   Time 4   Period Weeks   Status Not Met   PT SHORT TERM GOAL #10   TITLE TUG manual time will be within 40% of TUG normal time to indicate increased efficiency of mobility while dual-tasking. Target date: 03/08/15   Baseline Original TUG was 16.78 and manual TUG was approx 24 secs on 03/08/15   Time 4   Period Weeks   Status Not Met   PT SHORT TERM GOAL #11   TITLE Pt will ambulate x100' over indoor solid/compliant surfaces and around 10 obstacles with mod I using SPC to indicate increased safety using cane in work environment. Target date: 03/08/15   Baseline met 03/15/15   Time 4   Period Weeks   Status Achieved           PT Long Term Goals - 04/05/15 1315    PT LONG TERM GOAL #1   Title Pt will improve 6 Minute Walk distance by 150' from baseline to indicate significant improvement in functional endurance. Target date: 12/04/14   Baseline 9/20: baseline 6MWT distance = 556' with RW;  11/15: 6MWT distance = 910 ' with RW   Status Achieved   PT LONG TERM GOAL #2   Title Pt will improve TUG score by 10.8 seconds from baseline to indicate significant improvement in  efficiency of functional mobility.  Target date: 12/04/14   Baseline 9/20: baseline TUG = 28.4 sec with RW;  11/15: TUG = 17.67 with RW   Status Achieved   PT LONG TERM GOAL #3   Title Pt will ambulate 500' over unlevel, paved surfaces with mod I using LRAD to indicate increased stability/independence with community mobility. Target date: 12/04/14   Baseline Met 11/10 with [facility].   Status Achieved   PT LONG TERM GOAL #4   Title Pt will negotiate standard ramp and curb step with mod I using LRAD to indicate increased safety/indepedence traversing community obstacles. Target date: 12/04/14   Baseline Met 11/10 with [facility].   Status Achieved   PT LONG TERM GOAL #5   Title Pt will negotiate 14 stairs with 2 rails and mod I to indicate increased independence accessing second floor of home.  Target date: 12/04/14   Baseline Met 11/27/14.   Status Achieved   PT LONG TERM GOAL #6   Title Pt will improve Berg score by 8 points from baseline to indicate significant improvement in functional standing balance. Target date: 02/06/15   Baseline 11/18: baseline Berg score = 34/56; 1/12: Berg score = 43/56   Status Achieved   PT LONG TERM GOAL #7   Title Pt will improve gait velocity to > 2.62 ft/sec to indicate status of community ambulator.  Target date: 02/06/15   Baseline 2.78 ft/sec w/ cane on 02/06/15   Status Achieved   PT LONG TERM GOAL #8   Title Pt will perform TUG in < 13.5 seconds to indicate decreased fall risk. Modified target date: 04/19/15   Baseline 17.13 with cane and 16.78 secs without cane 02/06/15  Continue as LTG through renewed POC.   Time 8   Period Weeks   Status On-going   PT LONG TERM GOAL  #9   TITLE Pt will ambulate 21' over level, indoor surfaces without AD with mod I  to indicate increased stability/independence with household ambulation.  Target date: 02/06/15   Baseline met 02/06/15    Time 8   Period Weeks   Status Achieved   PT LONG TERM GOAL  #10   TITLE Pt will ambulate  150' indoors over level and unlevel surfaces of varying heights and compliance with no AD and mod I to demonstrate safety/independence with ambulation during work.  Modified target date: 04/19/15   Time 8   Period Weeks   Status On-going   PT LONG TERM GOAL  #11   TITLE TUG manual will be within 25% of TUG normal to indicate increased efficiency of mobility while dual-tasking.  Modified target date: 04/19/15   Time 8   Period Weeks   Status On-going   PT LONG TERM GOAL  #12   TITLE Pt will ambulate 200' over unlevel, paved surfaces with mod I (for increased time, use of orthoses) but without AD to indicate increased independence with community mobility.  Modified target date: 04/19/15   Time 8   Period Weeks   Status On-going               Plan - 04/05/15 1308    Clinical Impression Statement Session focused on gait training without use of L Blue [place]er AFO to address pt-stated goal of ambulating on beach. When not wearing L Blue [place]er, noted better control of L genu recurvatum; however, pt with increased L forefoot supination and L toe catch x1 (during 115' trial). Trialed L FootUp brce with noted improvement in L ankle stability and more consistent LLE clearance. Continue per POC.   Pt will benefit from skilled therapeutic intervention in order to improve on the following deficits Abnormal gait;Decreased activity tolerance;Decreased balance;Decreased endurance;Decreased coordination;Decreased mobility;Decreased strength;Impaired sensation;Pain   Rehab Potential Good   PT Frequency 1x / week   PT Duration 8 weeks   PT Treatment/Interventions Vestibular;ADLs/Self Care Home Management;DME Instruction;Balance training;Therapeutic exercise;Therapeutic activities;Functional mobility training;Stair training;Gait training;Neuromuscular re-education;Patient/family education;Electrical Stimulation   PT Next Visit Plan Trial gait without L hinged knee brace. If pt c/o back pain, further assess  and treat prn. * BEGIN CHECKING LTG's.   Consulted and Agree with Plan of Care Patient;Family member/caregiver   Family Member Consulted mother        Problem List Patient Active Problem List   Diagnosis Date Noted  . Adjustment disorder with depressed mood   . Tetraplegia ([place]) 09/19/2014  . Acute blood loss anemia 09/19/2014  . Cervical spondylosis with myelopathy 09/18/2014  . Radiculopathy of arm 08/28/2014  . Hypogonadism male 09/08/2013  . 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 Cardiomyopathy 04/01/2011  . Wolff-Parkinson-White (WPW) syndrome 04/01/2011    Billie Ruddy, PT, DPT [facility] [address] [place], [place], [address] Phone: [phone]   Fax:  [phone] 04/05/2015, 1:17 PM  Name: ANGAS ISABELL MRN: [id number] Date of Birth: 1970-07-22

## 2015-04-12 ENCOUNTER — Ambulatory Visit: Payer: Managed Care, Other (non HMO) | Admitting: Physical Therapy

## 2015-04-12 DIAGNOSIS — R269 Unspecified abnormalities of gait and mobility: Secondary | ICD-10-CM

## 2015-04-12 DIAGNOSIS — R2681 Unsteadiness on feet: Secondary | ICD-10-CM

## 2015-04-12 DIAGNOSIS — M21372 Foot drop, left foot: Secondary | ICD-10-CM

## 2015-04-12 DIAGNOSIS — R531 Weakness: Secondary | ICD-10-CM

## 2015-04-12 NOTE — Patient Instructions (Signed)
[place], stop performing single leg bridges and clamshells until next time you're in PT (next Thursday, 3/30).       Sit on your bed with your back against the headboard. Bring your left knee toward your chest (as pictured). You should feel a gentle stretch in the outside/back of your left hip ("back pocket muscle"). Hold stretch for 60 seconds, 2-3 times per day.     SINGLE LIMB STANCE    Stand with your back to a corner with a stable chair in front of you. Lift your RIGHT leg; try to stand on LEFT leg with minimal R hand support (1-2 fingers on chair). Try to hold for 5 seconds without losing balance. Your parents will let you know if you're leaning to the LEFT side. Relax. Repeat on opposite leg. If this is painful, stop exercise until tomorrow.   Perform 5 reps on each leg. Do this twice per day.   Hamstring Stretch, Seated (Strap, Two Chairs)    Sit with LEFT leg extended onto facing chair.  Loop strap (sheet or belt) over top of LEFT foot (as pictured). Pull until you feel a gentle stretch in the back of your calf. Make sure your foot alignment is neutral (not turned inward) - you may need to pull the belt/sheet a little more with the left hand than the right hand to achieve this. Hold for 30 seconds, 4 times per day.   ANKLE: Dorsiflexion - Sitting    Sitting, place strap around LEFT foot. First, use your muscles to pull the foot upward (and outward) as far as you can go; then, use the strap to help to pull your foot up the rest of the way. Perform 20 reps, 3 times per day.  Feet Apart (Compliant Surface) Varied Arm Positions - Eyes Closed    Stand on a couch cushion with arms by your side with feet shoulder width apart and arms out. Close eyes and visualize upright position. Hold_30___ seconds.  Repeat _4_ times per session. Do _2_ sessions per day.

## 2015-04-12 NOTE — Therapy (Signed)
[facility] [address] [place], [place], [address] Phone: [phone]   Fax:  [phone]  Physical Therapy Treatment  Patient Details  Name: Mark Gallagher MRN: [id number] Date of Birth: 1970-03-13 No Data Recorded  Encounter Date: 04/12/2015      PT End of Session - 04/12/15 1222    Visit Number 43   Number of Visits 45   Date for PT Re-Evaluation 04/21/15   Authorization Type [employer]    Authorization - Visit Number 11   PT Start Time 3267   PT Stop Time 1153   PT Time Calculation (min) 48 min   Equipment Utilized During Treatment Gait belt   Activity Tolerance Patient tolerated treatment well   Behavior During Therapy [facility] for tasks assessed/performed      Past Medical History  Diagnosis Date  . Thyroid disease   . Cancer ([place])   . 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 Testosterone deficiency   . Knee pain, bilateral   . History of pituitary tumor     Past Surgical History  Procedure Laterality Date  . Cervical disc surgery  2007    Anterior C4/5 vertebrectomy and c3-C6 laminoplasty with plating  . Craniotomy for tumor      pituitary adenoma    There were no vitals filed for this visit.  Visit Diagnosis:  Abnormality of gait  Unsteadiness  Weakness generalized  Foot drop, left      Subjective Assessment - 04/12/15 1108    Subjective Pt still working a lot. Pt has been using the [facility] at work. Hasn't ordered the FootUp brace yet; wanted to make sure PT still thought it was a good idea. Pt continues to c/o intermittent L buttock pain when doing home exercises where he stands on L leg as well as clamshells.   Patient is accompained by: Family member  mother   Pertinent History * Restrictions: no lifting > 30 lbs; do not work on neck ROM.  PMH: cervical spondylosis with myelopathy; s/p cervical vertebrectomy C4/5 with C3-6 plating 2007 acromegaly s/p pituitary tumor resection (1994); hypogonadism;  Wolff-Parkinson-White Syndrome; cardiomyopathy; acute blood loss anemia; adjustment disorder with depressed mood   Patient Stated Goals "I'd like to be able to walk by myself with no assistance, stand up out of a chair by myself, be able to get up and down stairs without assistance.   Currently in Pain? Yes   Pain Score 2    Pain Location Other (Comment)  between the shoulder blades   Pain Descriptors / Indicators Aching   Pain Type Chronic pain   Pain Onset 1 to 4 weeks ago   Pain Frequency Intermittent   Aggravating Factors  exercises, work   Pain Relieving Factors rest, stretching   Multiple Pain Sites No            [facility] PT Assessment - 04/12/15 0001    Observation/Other Assessments   Observations Assessment of LLE reveals point tenderness to palpation in L piriformis and in L gluteus medius insertion; increased pain with passive L hip flexion with internal rotation. Pain also increased with active hip L ABD, external rotation, and extension. L hip IR PROM very limited.                      [facility] Adult PT Treatment/Exercise - 04/12/15 0001    Ambulation/Gait   Ambulation/Gait Yes   Ambulation/Gait Assistance 5: Supervision;4: Min guard   Ambulation/Gait Assistance Details x230' without  L Blue [place]er AFO and without L hinged knee brace. L knee knee stable (no genu recurvatum) without L hinged knee brace; however, pt continues to demo limited L ankle DF/eversion during LLE advancement.   Ambulation Distance (Feet) 230 Feet   Assistive device Straight cane   Gait Pattern Step-through pattern;Decreased stance time - left;Decreased weight shift to left;Lateral hip instability;Poor foot clearance - left;Left genu recurvatum   Ambulation Surface Level;Indoor   Self-Care   Other Self-Care Comments  Recommending pt either continue to utilize L [employer] with L hinged knee brace OR use L FootUp brace and slowly wean off of L hinged knee brace, as safe/tolerable. Pt/mother  verbalized understanding and were in full agreement. See Pt Instructions for further information regarding HEP modifications.    Exercises   Exercises Other Exercises   Other Exercises  Reviewed all prior home exercises (with exception to standing EC on pillow, due to time constaint). Removed clamshells and L single leg bridging from HEP due to increased L buttock pain with exercises. Added L gluteus medius/TFL self-stretch to HEP, which pt effectively performed 2 x1-minute holds with cueing from PT. See Pt Instructions for details on all exercises, reps, sets, frequency, and duration.                PT Education - 04/12/15 1212    Education provided Yes   Education Details Recommended pt stop performing L clamshells and L single limb bridging to prevent overuse of/pain in L gluteus medius muscle. HEP: added L gluteus medius self-stretch.   Person(s) Educated Patient;Parent(s)  mother   Methods Explanation;Demonstration;Verbal cues;Handout   Comprehension Verbalized understanding;Returned demonstration          PT Short Term Goals - 03/15/15 1025    PT SHORT TERM GOAL #1   Title Pt will perform initial HEP with mod I using paper handout to indicate safe HEP compliance, maximize functional gains made in PT. Target date: 11/06/14   Baseline Met 9/22.   Status Achieved   PT SHORT TERM GOAL #2   Title Perform 6 Minute Walk Test to assess/address functional endurance. Target date: 11/06/14   Baseline 9/20: 556' with RW   Status Achieved   PT SHORT TERM GOAL #3   Title Perform TUG to assess/address efficiency of functional mobility. Target date: 11/06/14   Baseline 9/20: TUG time = 28.3 seconds with RW   Status Achieved   PT SHORT TERM GOAL #4   Title Pt will ambulate 200' over level, indoor surfaces with mod I using LRAD to indicate increased safety/indepedence with household ambulation. Target date: 11/06/14   Baseline Met 10/12 with bariatric RW.   Status Achieved   PT SHORT  TERM GOAL #5   Title Pt will negotiate 14 stairs with B rails and min A of single person to indicate increased independence accessing second floor of home. Target date: 11/06/14   Baseline 10/10: required min guard of PT for stairs x16   Status Achieved   PT SHORT TERM GOAL #6   Title Complete Berg Balance Scale and improve score by 4 points from baseline to progress toward significant improvement in functional standing balance. Target date: 01/02/15   Baseline 11/18: Berg score = 34/56   Status Achieved   PT SHORT TERM GOAL #7   Title Pt will improve gait velocity from 1.94 ft/sec to 2.24 ft/sec to indicate increased efficiency of ambulation. Target date: 01/02/15   Baseline 11/15: 1.94 ft/sec using [facility]  12/15: 2.54 ft/sec with [facility]   Status Achieved   PT SHORT TERM GOAL #8   Title Pt will ambulate 59' over level, indoor surfaces without AD requiring supervision to progress toward independence with household mobility. Target date: 01/02/15   Baseline 12/15: ambulated x115' without AD with supervision   Status Achieved   PT SHORT TERM GOAL #9   TITLE Pt will ambulate 150' over paved, outdoor surfaces without AD requiring supervision to indicate increased independence with community ambulation. Target date: 03/08/15   Baseline requires min/guard on 03/08/15 (due to increased pain in L leg)   Time 4   Period Weeks   Status Not Met   PT SHORT TERM GOAL #10   TITLE TUG manual time will be within 40% of TUG normal time to indicate increased efficiency of mobility while dual-tasking. Target date: 03/08/15   Baseline Original TUG was 16.78 and manual TUG was approx 24 secs on 03/08/15   Time 4   Period Weeks   Status Not Met   PT SHORT TERM GOAL #11   TITLE Pt will ambulate x100' over indoor solid/compliant surfaces and around 10 obstacles with mod I using SPC to indicate increased safety using cane in work environment. Target date: 03/08/15   Baseline met 03/15/15   Time 4   Period Weeks    Status Achieved           PT Long Term Goals - 04/05/15 1315    PT LONG TERM GOAL #1   Title Pt will improve 6 Minute Walk distance by 150' from baseline to indicate significant improvement in functional endurance. Target date: 12/04/14   Baseline 9/20: baseline 6MWT distance = 556' with RW;  11/15: 6MWT distance = 910 ' with RW   Status Achieved   PT LONG TERM GOAL #2   Title Pt will improve TUG score by 10.8 seconds from baseline to indicate significant improvement in efficiency of functional mobility.  Target date: 12/04/14   Baseline 9/20: baseline TUG = 28.4 sec with RW;  11/15: TUG = 17.67 with RW   Status Achieved   PT LONG TERM GOAL #3   Title Pt will ambulate 500' over unlevel, paved surfaces with mod I using LRAD to indicate increased stability/independence with community mobility. Target date: 12/04/14   Baseline Met 11/10 with [facility].   Status Achieved   PT LONG TERM GOAL #4   Title Pt will negotiate standard ramp and curb step with mod I using LRAD to indicate increased safety/indepedence traversing community obstacles. Target date: 12/04/14   Baseline Met 11/10 with [facility].   Status Achieved   PT LONG TERM GOAL #5   Title Pt will negotiate 14 stairs with 2 rails and mod I to indicate increased independence accessing second floor of home.  Target date: 12/04/14   Baseline Met 11/27/14.   Status Achieved   PT LONG TERM GOAL #6   Title Pt will improve Berg score by 8 points from baseline to indicate significant improvement in functional standing balance. Target date: 02/06/15   Baseline 11/18: baseline Berg score = 34/56; 1/12: Berg score = 43/56   Status Achieved   PT LONG TERM GOAL #7   Title Pt will improve gait velocity to > 2.62 ft/sec to indicate status of community ambulator.  Target date: 02/06/15   Baseline 2.78 ft/sec w/ cane on 02/06/15   Status Achieved   PT LONG TERM GOAL #8   Title Pt will perform TUG in < 13.5 seconds  to indicate decreased fall risk. Modified  target date: 04/19/15   Baseline 17.13 with cane and 16.78 secs without cane 02/06/15  Continue as LTG through renewed POC.   Time 8   Period Weeks   Status On-going   PT LONG TERM GOAL  #9   TITLE Pt will ambulate 4' over level, indoor surfaces without AD with mod I to indicate increased stability/independence with household ambulation.  Target date: 02/06/15   Baseline met 02/06/15    Time 8   Period Weeks   Status Achieved   PT LONG TERM GOAL  #10   TITLE Pt will ambulate 150' indoors over level and unlevel surfaces of varying heights and compliance with no AD and mod I to demonstrate safety/independence with ambulation during work.  Modified target date: 04/19/15   Time 8   Period Weeks   Status On-going   PT LONG TERM GOAL  #11   TITLE TUG manual will be within 25% of TUG normal to indicate increased efficiency of mobility while dual-tasking.  Modified target date: 04/19/15   Time 8   Period Weeks   Status On-going   PT LONG TERM GOAL  #12   TITLE Pt will ambulate 200' over unlevel, paved surfaces with mod I (for increased time, use of orthoses) but without AD to indicate increased independence with community mobility.  Modified target date: 04/19/15   Time 8   Period Weeks   Status On-going               Plan - 04/12/15 1224    Clinical Impression Statement Session focused on further assessing/addressing L buttock pain, modifying HEP to address said pain, and assessing gait pattern with neither L Blue [place]er nor L hinged knee brace and making recommendations accordingly. L buttock pain appears to be originating from L gluteus medius and L piriformis muscles. Unable to rule out overuse of L gluteus medius, as glut med activation has been primary focus of recent PT sessions. Recommended pt make the following HEP modifications to address pain: remove L clamshells and L SL bridging and add L gluteus medius self-stretch. Pt/mother verbally agreed. Also recommending that pt either  continue to wear L Blue [place]er with L hinged knee brace or wear L FootUp brace and slowly wean off of L hinged knee brace, as safe/tolerated. Pt/mother verbalized understandingof all recommendations and are in agreement with tentative DC from PT after next session.    Pt will benefit from skilled therapeutic intervention in order to improve on the following deficits Abnormal gait;Decreased activity tolerance;Decreased balance;Decreased endurance;Decreased coordination;Decreased mobility;Decreased strength;Impaired sensation;Pain   Rehab Potential Good   PT Frequency 1x / week   PT Duration 8 weeks   PT Treatment/Interventions Vestibular;ADLs/Self Care Home Management;DME Instruction;Balance training;Therapeutic exercise;Therapeutic activities;Functional mobility training;Stair training;Gait training;Neuromuscular re-education;Patient/family education;Electrical Stimulation   PT Next Visit Plan Ask about L buttock pain. ** Check LTG's and DC.    Consulted and Agree with Plan of Care Patient;Family member/caregiver   Family Member Consulted mother        Problem List Patient Active Problem List   Diagnosis Date Noted  . Adjustment disorder with depressed mood   . Tetraplegia ([place]) 09/19/2014  . Acute blood loss anemia 09/19/2014  . Cervical spondylosis with myelopathy 09/18/2014  . Radiculopathy of arm 08/28/2014  . Hypogonadism male 09/08/2013  . 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 Cardiomyopathy 04/01/2011  . Wolff-Parkinson-White (WPW) syndrome 04/01/2011    Billie Ruddy, PT, DPT [facility]  [address] [place], [place], [address] Phone: [phone]   Fax:  [phone] 04/12/2015, 12:34 PM  Name: AKITO BOOMHOWER MRN: [id number] Date of Birth: 11-08-1970

## 2015-04-13 ENCOUNTER — Ambulatory Visit (INDEPENDENT_AMBULATORY_CARE_PROVIDER_SITE_OTHER): Payer: Managed Care, Other (non HMO) | Admitting: *Deleted

## 2015-04-13 DIAGNOSIS — E291 Testicular hypofunction: Secondary | ICD-10-CM | POA: Diagnosis not present

## 2015-04-13 MED ORDER — TESTOSTERONE CYPIONATE 200 MG/ML IM SOLN
100.0000 mg | Freq: Once | INTRAMUSCULAR | Status: AC
  Administered 2015-12-07: 100 mg via INTRAMUSCULAR

## 2015-04-13 NOTE — Progress Notes (Signed)
Patient is here for testosterone injection 0.5 mg only.

## 2015-04-19 ENCOUNTER — Ambulatory Visit: Payer: Managed Care, Other (non HMO) | Admitting: Physical Therapy

## 2015-04-19 DIAGNOSIS — M21372 Foot drop, left foot: Secondary | ICD-10-CM

## 2015-04-19 DIAGNOSIS — R269 Unspecified abnormalities of gait and mobility: Secondary | ICD-10-CM | POA: Diagnosis not present

## 2015-04-19 DIAGNOSIS — R2681 Unsteadiness on feet: Secondary | ICD-10-CM

## 2015-04-19 DIAGNOSIS — R531 Weakness: Secondary | ICD-10-CM

## 2015-04-19 NOTE — Therapy (Signed)
[facility] [address] [place], [place], [address] Phone: [phone]   Fax:  [phone]  Physical Therapy Treatment  Patient Details  Name: Mark Gallagher MRN: [id number] Date of Birth: 11-12-1970 No Data Recorded  Encounter Date: 04/19/2015      PT End of Session - 04/19/15 1334    Visit Number 71   Number of Visits 45   Date for PT Re-Evaluation 04/21/15   Authorization Type [employer]    Authorization - Visit Number 12   Authorization - Number of Visits 30   PT Start Time 3536   PT Stop Time 1154   PT Time Calculation (min) 58 min   Equipment Utilized During Treatment Gait belt   Activity Tolerance Patient tolerated treatment well   Behavior During Therapy [facility] for tasks assessed/performed      Past Medical History  Diagnosis Date  . Thyroid disease   . Cancer ([place])   . 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)   . Testosterone deficiency   . Knee pain, bilateral   . History of pituitary tumor     Past Surgical History  Procedure Laterality Date  . Cervical disc surgery  2007    Anterior C4/5 vertebrectomy and c3-C6 laminoplasty with plating  . Craniotomy for tumor      pituitary adenoma    There were no vitals filed for this visit.  Visit Diagnosis:  Abnormality of gait  Unsteadiness  Weakness generalized  Foot drop, left      Subjective Assessment - 04/19/15 1059    Subjective "I wasn't able to order the brace because I can't remember my password on [employer]."   Patient is accompained by: Family member  mother and father   Pertinent History * Restrictions: no lifting > 30 lbs; do not work on neck ROM.  PMH: cervical spondylosis with myelopathy; s/p cervical vertebrectomy C4/5 with C3-6 plating 2007 acromegaly s/p pituitary tumor resection (1994); hypogonadism; Wolff-Parkinson-White Syndrome; cardiomyopathy; acute blood loss anemia; adjustment disorder with depressed mood   Patient Stated Goals "I'd like to be able  to walk by myself with no assistance, stand up out of a chair by myself, be able to get up and down stairs without assistance.   Currently in Pain? Yes   Pain Score 4    Pain Location Other (Comment)  between shoulder blades   Pain Orientation Right;Left   Pain Descriptors / Indicators Aching   Pain Type Chronic pain   Pain Onset 1 to 4 weeks ago   Pain Frequency Intermittent   Aggravating Factors  exercise, work   Pain Relieving Factors rest, stretching   Multiple Pain Sites No                         [facility] Adult PT Treatment/Exercise - 04/19/15 0001    Transfers   Transfers Sit to Stand;Stand to Sit   Sit to Stand 6: Modified independent (Device/Increase time)   Stand to Sit 6: Modified independent (Device/Increase time)   Ambulation/Gait   Ambulation/Gait Yes   Ambulation/Gait Assistance 6: Modified independent (Device/Increase time);5: Supervision   Ambulation/Gait Assistance Details Pt consistently mod I when using SPC for indoor and outdoor (unlevel, paved) gait. Using L hinged knee brace and L Blue Rocker AFO, performed gait x200' indoors without AD, x400' outdoors (unlevel, paved surfaces) without AD with mod I for initial 250', supervision for remaining 150' due to fatigue, intermittent L toe catch. Gait [phone]' without AD with L  PLS AFO with noted decrease inL stance stability. Gait [phone]' with L hinged knee brace and L Foot Up brace with improved control of L genu recurvatum and consistent LLE clearance.    Ambulation Distance (Feet) 830 Feet  total   Assistive device None   Gait Pattern Step-through pattern;Decreased stance time - left;Decreased weight shift to left;Lateral hip instability;Poor foot clearance - left;Left genu recurvatum   Ramp 6: Modified independent (Device)   Ramp Details (indicate cue type and reason) without AD   Curb 5: Supervision   Curb Details (indicate cue type and reason) x2 trials without AD; ascends and descends curb leading with  LLE.   Gait Comments 1 episode of L genu recurvatum when ambulating uphill with L Blue Rocker AFO and L hinged knee brace.   Timed Up and Go Test   TUG Normal TUG;Manual TUG   Normal TUG (seconds) 12.96  12.96 with SPC; 17.05 without AD   Manual TUG (seconds) 15.3  without AD   Self-Care   Self-Care Other Self-Care Comments   Other Self-Care Comments  Educated pt on PT goals, findings, progress, and D/C plan. Provided verbal instruction and paper handout for safe progression of home exercises. Recommending that pt continue to progress gait without cane over unlevelm outdoor surfaces with supervision from parents for curb step negotiation and outdoor ambulation beyond 200', as gait stability decreases as pt becomes more fatigued. Patient is now safe to progress from L Blue Rocker AFO to L Foot Up brace for dorsiflexion assist, consistent LLE clearance. Pt will need to progress to wearing L Foot Up brace slowly (increase by 1 hour at a time, as tolerated). Pt and parents verbalized understanding and were in full agreement with DC plan.                PT Education - 04/19/15 1324    Education provided Yes   Education Details PT goals, progress, and DC plan. Instructions for progressing HEP. See Self Care for additional education provided.   Person(s) Educated Patient;Parent(s)  mother and father   Methods Explanation;Handout   Comprehension Verbalized understanding          PT Short Term Goals - 03/15/15 1025    PT SHORT TERM GOAL #1   Title Pt will perform initial HEP with mod I using paper handout to indicate safe HEP compliance, maximize functional gains made in PT. Target date: 11/06/14   Baseline Met 9/22.   Status Achieved   PT SHORT TERM GOAL #2   Title Perform 6 Minute Walk Test to assess/address functional endurance. Target date: 11/06/14   Baseline 9/20: 556' with RW   Status Achieved   PT SHORT TERM GOAL #3   Title Perform TUG to assess/address efficiency of  functional mobility. Target date: 11/06/14   Baseline 9/20: TUG time = 28.3 seconds with RW   Status Achieved   PT SHORT TERM GOAL #4   Title Pt will ambulate 200' over level, indoor surfaces with mod I using LRAD to indicate increased safety/indepedence with household ambulation. Target date: 11/06/14   Baseline Met 10/12 with bariatric RW.   Status Achieved   PT SHORT TERM GOAL #5   Title Pt will negotiate 14 stairs with B rails and min A of single person to indicate increased independence accessing second floor of home. Target date: 11/06/14   Baseline 10/10: required min guard of PT for stairs x16   Status Achieved   PT SHORT TERM GOAL #6  Title Complete Berg Balance Scale and improve score by 4 points from baseline to progress toward significant improvement in functional standing balance. Target date: 01/02/15   Baseline 11/18: Berg score = 34/56   Status Achieved   PT SHORT TERM GOAL #7   Title Pt will improve gait velocity from 1.94 ft/sec to 2.24 ft/sec to indicate increased efficiency of ambulation. Target date: 01/02/15   Baseline 11/15: 1.94 ft/sec using LBQC      12/15: 2.54 ft/sec with [facility]   Status Achieved   PT SHORT TERM GOAL #8   Title Pt will ambulate 44' over level, indoor surfaces without AD requiring supervision to progress toward independence with household mobility. Target date: 01/02/15   Baseline 12/15: ambulated x115' without AD with supervision   Status Achieved   PT SHORT TERM GOAL #9   TITLE Pt will ambulate 150' over paved, outdoor surfaces without AD requiring supervision to indicate increased independence with community ambulation. Target date: 03/08/15   Baseline requires min/guard on 03/08/15 (due to increased pain in L leg)   Time 4   Period Weeks   Status Not Met   PT SHORT TERM GOAL #10   TITLE TUG manual time will be within 40% of TUG normal time to indicate increased efficiency of mobility while dual-tasking. Target date: 03/08/15   Baseline  Original TUG was 16.78 and manual TUG was approx 24 secs on 03/08/15   Time 4   Period Weeks   Status Not Met   PT SHORT TERM GOAL #11   TITLE Pt will ambulate x100' over indoor solid/compliant surfaces and around 10 obstacles with mod I using SPC to indicate increased safety using cane in work environment. Target date: 03/08/15   Baseline met 03/15/15   Time 4   Period Weeks   Status Achieved           PT Long Term Goals - 04/19/15 1104    PT LONG TERM GOAL #1   Title Pt will improve 6 Minute Walk distance by 150' from baseline to indicate significant improvement in functional endurance. Target date: 12/04/14   Baseline 9/20: baseline 6MWT distance = 556' with RW;  11/15: 6MWT distance = 910 ' with RW   Status Achieved   PT LONG TERM GOAL #2   Title Pt will improve TUG score by 10.8 seconds from baseline to indicate significant improvement in efficiency of functional mobility.  Target date: 12/04/14   Baseline 9/20: baseline TUG = 28.4 sec with RW;  11/15: TUG = 17.67 with RW   Status Achieved   PT LONG TERM GOAL #3   Title Pt will ambulate 500' over unlevel, paved surfaces with mod I using LRAD to indicate increased stability/independence with community mobility. Target date: 12/04/14   Baseline Met 11/10 with LBQC.   Status Achieved   PT LONG TERM GOAL #4   Title Pt will negotiate standard ramp and curb step with mod I using LRAD to indicate increased safety/indepedence traversing community obstacles. Target date: 12/04/14   Baseline Met 11/10 with LBQC.   Status Achieved   PT LONG TERM GOAL #5   Title Pt will negotiate 14 stairs with 2 rails and mod I to indicate increased independence accessing second floor of home.  Target date: 12/04/14   Baseline Met 11/27/14.   Status Achieved   PT LONG TERM GOAL #6   Title Pt will improve Berg score by 8 points from baseline to indicate significant improvement in functional standing balance. Target  date: 02/06/15   Baseline 11/18: baseline  Berg score = 34/56; 1/12: Berg score = 43/56   Status Achieved   PT LONG TERM GOAL #7   Title Pt will improve gait velocity to > 2.62 ft/sec to indicate status of community ambulator.  Target date: 02/06/15   Baseline 2.78 ft/sec w/ cane on 02/06/15   Status Achieved   PT LONG TERM GOAL #8   Title Pt will perform TUG in < 13.5 seconds to indicate decreased fall risk. Modified target date: 04/19/15   Baseline 3/30: 12.96 seconds with SPC.  Continue as LTG through renewed POC.   Time 8   Period Weeks   Status Achieved   PT LONG TERM GOAL  #9   TITLE Pt will ambulate 44' over level, indoor surfaces without AD with mod I to indicate increased stability/independence with household ambulation.  Target date: 02/06/15   Baseline met 02/06/15    Time 8   Period Weeks   Status Achieved   PT LONG TERM GOAL  #10   TITLE Pt will ambulate 150' indoors over level and unlevel surfaces of varying heights and compliance with no AD and mod I to demonstrate safety/independence with ambulation during work.  Modified target date: 04/19/15   Time 8   Period Weeks   Status Achieved   PT LONG TERM GOAL  #11   TITLE TUG manual will be within 25% of TUG normal to indicate increased efficiency of mobility while dual-tasking.  Modified target date: 04/19/15   Baseline 3/30: Normal TUG without AD = 17.05 sec. Manual TUG without AD = 15.3 sec   Time 8   Period Weeks   Status Achieved   PT LONG TERM GOAL  #12   TITLE Pt will ambulate 200' over unlevel, paved surfaces with mod I (for increased time, use of orthoses) but without AD to indicate increased independence with community mobility.  Modified target date: 04/19/15   Time 8   Period Weeks   Status Achieved               Plan - 04/19/15 1336    Clinical Impression Statement Pt has met 12 of 12 LTG's and therefore will be discharged from outpatient PT at this time. Pt has plan for ongoing exercise and is safely/effectively progressing a walking program.  Pt/parents in full agreement with DC plan.   Consulted and Agree with Plan of Care Patient;Family member/caregiver   Family Member Consulted mother and father        Problem List Patient Active Problem List   Diagnosis Date Noted  . Adjustment disorder with depressed mood   . Tetraplegia ([place]) 09/19/2014  . Acute blood loss anemia 09/19/2014  . Cervical spondylosis with myelopathy 09/18/2014  . Radiculopathy of arm 08/28/2014  . Hypogonadism male 09/08/2013  . 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 Cardiomyopathy 04/01/2011  . Wolff-Parkinson-White (WPW) syndrome 04/01/2011   .PHYSICAL THERAPY DISCHARGE SUMMARY  Visits from Start of Care: 45  Current functional level related to goals / functional outcomes: See above goals and goal statuses/outcomes.   Remaining deficits: Pt continues to demonstrate gait impairments, including L foot drop; balance impairments; requires L hinged knee brace and L AFO for all ambulation; and requires SPC for outdoor ambulation > 250'. Pt is proficient at performing HEP to address said impairments.   Education / Equipment: HEP and progression; recommendations regarding L AFO.  Plan: Patient agrees to discharge.  Patient goals were met. Patient is being discharged due to  meeting the stated rehab goals.  ?????       Billie Ruddy, PT, DPT [facility] [address] [place], [place], [address] Phone: [phone]   Fax:  [phone] 04/19/2015, 1:38 PM   Name: OLUWADARASIMI REDMON MRN: [id number] Date of Birth: August 12, 1970

## 2015-04-19 NOTE — Patient Instructions (Signed)
Jiarui, stop performing single leg bridges and clamshells until next time you have no left buttock pain for > 1 week. Try to add the clamshell exercise back in, as tolerated. If buttock pain returns, decrease # of clamshells you're doing on the left.       Sit on your bed with your back against the headboard. Bring your left knee toward your chest (as pictured). You should feel a gentle stretch in the outside/back of your left hip ("back pocket muscle"). Hold stretch for 60 seconds, 2-3 times per day.   SINGLE LIMB STANCE    Stand with your back to a corner with a stable chair in front of you. Lift your RIGHT leg; try to stand on LEFT leg with minimal R hand support (1-2 fingers on chair). Try to hold for 5 seconds without losing balance. Your parents will let you know if you're leaning to the LEFT side. Relax. Repeat on opposite leg. If this is painful, stop exercise until tomorrow.   Perform 5 reps on each leg. Do this twice per day.   Hamstring Stretch, Seated (Strap, Two Chairs)    Sit with LEFT leg extended onto facing chair.  Loop strap (sheet or belt) over top of LEFT foot (as pictured). Pull until you feel a gentle stretch in the back of your calf. Make sure your foot alignment is neutral (not turned inward) - you may need to pull the belt/sheet a little more with the left hand than the right hand to achieve this. Hold for 30 seconds, 4 times per day.   ANKLE: Dorsiflexion - Sitting    Sitting, place strap around LEFT foot. First, use your muscles to pull the foot upward (and outward) as far as you can go; then, use the strap to help to pull your foot up the rest of the way. Update (3/30): focus on pulling foot up and out. Perform 20 reps, 3 times per day.  Feet Apart (Compliant Surface) Varied Arm Positions - Eyes Closed    Stand on a couch cushion with arms by your side with feet shoulder width apart and arms out. Close eyes and visualize upright position.  Hold_30___ seconds.  Repeat _4_ times per session. Do _2_ sessions per day.     When you're able to stand with eyes closed on pillow for 30 consecutive seconds: 1.  Progress first by placing feet closer together.  2. When you're able to stand with feet together on pillow with eyes closed for 30 seconds, progress further by standing on 2 pillows or a couch cushion. When using couch cushion, you may need to start with feet apart initially, and progress by moving feet closer together.

## 2015-04-26 ENCOUNTER — Ambulatory Visit (INDEPENDENT_AMBULATORY_CARE_PROVIDER_SITE_OTHER): Payer: Managed Care, Other (non HMO) | Admitting: *Deleted

## 2015-04-26 DIAGNOSIS — E291 Testicular hypofunction: Secondary | ICD-10-CM

## 2015-04-26 DIAGNOSIS — E220 Acromegaly and pituitary gigantism: Secondary | ICD-10-CM | POA: Diagnosis not present

## 2015-04-26 MED ORDER — OCTREOTIDE ACETATE 30 MG IM KIT
30.0000 mg | PACK | Freq: Once | INTRAMUSCULAR | Status: AC
  Administered 2015-04-26: 30 mg via INTRAMUSCULAR

## 2015-04-26 NOTE — Progress Notes (Signed)
Patient here for testosterone 0.5 ml and Sandostatin LAR 30 mg injections only.

## 2015-05-16 ENCOUNTER — Ambulatory Visit (INDEPENDENT_AMBULATORY_CARE_PROVIDER_SITE_OTHER): Payer: Managed Care, Other (non HMO) | Admitting: *Deleted

## 2015-05-16 DIAGNOSIS — E291 Testicular hypofunction: Secondary | ICD-10-CM | POA: Diagnosis not present

## 2015-05-16 MED ORDER — TESTOSTERONE CYPIONATE 200 MG/ML IM SOLN
200.0000 mg | INTRAMUSCULAR | Status: DC
  Administered 2015-05-16 – 2015-05-30 (×2): 200 mg via INTRAMUSCULAR

## 2015-05-16 NOTE — Progress Notes (Signed)
   Subjective:    Patient ID: Mark Gallagher, male    DOB: [birth year], 45 y.o.   MRN: [id number]  HPI    Review of Systems     Objective:   Physical Exam        Assessment & Plan:  Patient is here for testosterone injection 0.5 ml only. He is one week late.

## 2015-05-30 ENCOUNTER — Ambulatory Visit (INDEPENDENT_AMBULATORY_CARE_PROVIDER_SITE_OTHER): Payer: Managed Care, Other (non HMO) | Admitting: *Deleted

## 2015-05-30 DIAGNOSIS — E291 Testicular hypofunction: Secondary | ICD-10-CM

## 2015-05-30 DIAGNOSIS — E220 Acromegaly and pituitary gigantism: Secondary | ICD-10-CM | POA: Diagnosis not present

## 2015-05-30 MED ORDER — OCTREOTIDE ACETATE 30 MG IM KIT: 30.0000 mg | PACK | INTRAMUSCULAR | Status: DC

## 2015-05-30 MED ORDER — OCTREOTIDE ACETATE 30 MG IM KIT
30.0000 mg | PACK | Freq: Once | INTRAMUSCULAR | Status: AC
  Administered 2015-05-30: 30 mg via INTRAMUSCULAR

## 2015-05-30 NOTE — Progress Notes (Signed)
   Subjective:    Patient ID: Mark Gallagher, male    DOB: 04/27/1970, 45 y.o.   MRN: [id number]  HPI    Review of Systems     Objective:   Physical Exam        Assessment & Plan:  Patient here for Sandostatin LAR 30 [place] only, due every 28 days and testosterone injection every 14 days.

## 2015-06-15 ENCOUNTER — Ambulatory Visit (INDEPENDENT_AMBULATORY_CARE_PROVIDER_SITE_OTHER): Payer: Managed Care, Other (non HMO) | Admitting: *Deleted

## 2015-06-15 DIAGNOSIS — E291 Testicular hypofunction: Secondary | ICD-10-CM | POA: Diagnosis not present

## 2015-06-28 ENCOUNTER — Ambulatory Visit (INDEPENDENT_AMBULATORY_CARE_PROVIDER_SITE_OTHER): Payer: Managed Care, Other (non HMO) | Admitting: *Deleted

## 2015-06-28 DIAGNOSIS — E220 Acromegaly and pituitary gigantism: Secondary | ICD-10-CM | POA: Diagnosis not present

## 2015-06-28 DIAGNOSIS — E291 Testicular hypofunction: Secondary | ICD-10-CM

## 2015-06-28 MED ORDER — TESTOSTERONE CYPIONATE 200 MG/ML IM SOLN
100.0000 mg | Freq: Once | INTRAMUSCULAR | Status: AC
  Administered 2015-06-28: 100 mg via INTRAMUSCULAR

## 2015-06-28 MED ORDER — OCTREOTIDE ACETATE 30 MG IM KIT
30.0000 mg | PACK | Freq: Once | INTRAMUSCULAR | Status: AC
  Administered 2015-06-28: 30 mg via INTRAMUSCULAR

## 2015-06-28 NOTE — Progress Notes (Signed)
   Subjective:    Patient ID: Mark Gallagher, male    DOB: 12-09-1970, 45 y.o.   MRN: [id number]  HPI    Review of Systems     Objective:   Physical Exam        Assessment & Plan:  Patient here for testosterone cypionate 0.5 ml and Sandostatin LAR [place] 30 mg only.

## 2015-07-13 ENCOUNTER — Ambulatory Visit (INDEPENDENT_AMBULATORY_CARE_PROVIDER_SITE_OTHER): Payer: Managed Care, Other (non HMO) | Admitting: *Deleted

## 2015-07-13 DIAGNOSIS — E291 Testicular hypofunction: Secondary | ICD-10-CM | POA: Diagnosis not present

## 2015-07-13 DIAGNOSIS — E220 Acromegaly and pituitary gigantism: Secondary | ICD-10-CM | POA: Diagnosis not present

## 2015-07-13 MED ORDER — TESTOSTERONE CYPIONATE 100 MG/ML IM SOLN
200.0000 mg | Freq: Once | INTRAMUSCULAR | Status: AC
  Administered 2015-07-13: 200 mg via INTRAMUSCULAR

## 2015-07-27 ENCOUNTER — Ambulatory Visit (INDEPENDENT_AMBULATORY_CARE_PROVIDER_SITE_OTHER): Payer: Managed Care, Other (non HMO) | Admitting: *Deleted

## 2015-07-27 VITALS — BP 94/66 | HR 40 | Temp 97.9°F | Resp 16 | Ht >= 80 in | Wt 246.0 lb

## 2015-07-27 DIAGNOSIS — E291 Testicular hypofunction: Secondary | ICD-10-CM | POA: Diagnosis not present

## 2015-07-27 DIAGNOSIS — E220 Acromegaly and pituitary gigantism: Secondary | ICD-10-CM

## 2015-07-27 MED ORDER — TESTOSTERONE CYPIONATE 200 MG/ML IM SOLN
100.0000 mg | Freq: Once | INTRAMUSCULAR | Status: AC
  Administered 2015-07-27: 100 mg via INTRAMUSCULAR

## 2015-07-27 MED ORDER — OCTREOTIDE ACETATE 30 MG IM KIT
30.0000 mg | PACK | Freq: Once | INTRAMUSCULAR | Status: AC
  Administered 2015-07-27: 30 mg via INTRAMUSCULAR

## 2015-07-27 NOTE — Patient Instructions (Signed)
     IF you received an x-ray today, you will receive an invoice from [facility]. Please contact [facility] at [phone] with questions or concerns regarding your invoice.   IF you received labwork today, you will receive an invoice from [facility]. Please contact [facility] at [phone] with questions or concerns regarding your invoice.   Our billing staff will not be able to assist you with questions regarding bills from these companies.  You will be contacted with the lab results as soon as they are available. The fastest way to get your results is to activate your My Chart account. Instructions are located on the last page of this paperwork. If you have not heard from us regarding the results in 2 weeks, please contact this office.      

## 2015-08-09 ENCOUNTER — Ambulatory Visit (INDEPENDENT_AMBULATORY_CARE_PROVIDER_SITE_OTHER): Payer: Managed Care, Other (non HMO) | Admitting: Emergency Medicine

## 2015-08-09 DIAGNOSIS — E291 Testicular hypofunction: Secondary | ICD-10-CM

## 2015-08-09 MED ORDER — TESTOSTERONE CYPIONATE 200 MG/ML IM SOLN
200.0000 mg | Freq: Once | INTRAMUSCULAR | Status: AC
  Administered 2015-08-09: 200 mg via INTRAMUSCULAR

## 2015-08-09 NOTE — Progress Notes (Signed)
Patient entered for injection only. No provider encounter.

## 2015-08-13 ENCOUNTER — Ambulatory Visit (INDEPENDENT_AMBULATORY_CARE_PROVIDER_SITE_OTHER): Payer: Managed Care, Other (non HMO) | Admitting: Physician Assistant

## 2015-08-13 VITALS — BP 102/56 | HR 50 | Temp 97.5°F | Resp 14 | Ht >= 80 in

## 2015-08-13 DIAGNOSIS — M1712 Unilateral primary osteoarthritis, left knee: Secondary | ICD-10-CM | POA: Diagnosis not present

## 2015-08-13 DIAGNOSIS — R001 Bradycardia, unspecified: Secondary | ICD-10-CM | POA: Diagnosis not present

## 2015-08-13 DIAGNOSIS — R5383 Other fatigue: Secondary | ICD-10-CM | POA: Diagnosis not present

## 2015-08-13 NOTE — Progress Notes (Signed)
08/13/2015 2:05 PM   DOB: 09-06-1970 / MRN: [id number]  SUBJECTIVE:  Mark Gallagher is a 45 y.o. male presenting for left knee pain. Reports the pain is at its worst in the morning and he describes the pain as "stiffness," which does improve with movement.  He does wear a knee brace and states this also helps somewhat.    He complains of feeling overwhelming fatigue, worse in the last 4-5 days.  He metoprolol was recently doubled by his electrophysiologist and he is going for an ablation procedure soon.    He sees Dr. Purcell Mouton at [facility] for endocrinology and was seen on the 31st of May and reports everything was fine.    He is allergic to oxycodone.   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    He  reports that he has never smoked. He does not have any smokeless tobacco history on file. He reports that he does not drink alcohol or use drugs. He  has no sexual activity history on file. The patient  has a past surgical history that includes Cervical disc surgery (2007) and Craniotomy for tumor.  His family history includes Acute myelogenous leukemia in his brother; High blood pressure in his mother.  Review of Systems  Constitutional: Negative for chills and fever.  Cardiovascular: Negative for chest pain.  Genitourinary: Negative for dysuria.  Skin: Negative for rash.  Neurological: Negative for headaches.    Problem list and medications reviewed and updated by myself where necessary, and exist elsewhere in the encounter.   OBJECTIVE:  BP (!) 102/56   Pulse (!) 50   Temp 97.5 F (36.4 C)   Resp 14   Ht 6\' 8"  (2.032 m)   SpO2 97%   Physical Exam  Constitutional: He appears well-developed and well-nourished. No distress.  Cardiovascular: Bradycardia present.   No murmur heard. Musculoskeletal:       Left knee: He exhibits decreased range of motion (2/2  pain). He exhibits no swelling, no effusion, no ecchymosis, no erythema, normal alignment, normal patellar mobility, no bony tenderness, normal meniscus and no MCL laxity. No tenderness found.  Skin: He is not diaphoretic.    No results found for this or any previous visit (from the past 72 hour(s)).  No results found.  BP Readings from Last 3 Encounters:  08/13/15 (!) 102/56  07/27/15 94/66  03/27/15 110/63   Pulse Readings from Last 3 Encounters:  08/13/15 (!) 50  07/27/15 (!) 40  03/27/15 (!) 55    No results found for: TSH   ASSESSMENT AND PLAN  Mark Gallagher was seen today for knee pain, hip pain and fatigue.  Diagnoses and all orders for this visit:  Bradycardia: His most recent endo work up at [facility] in May was WNL for him.  I have advised that he take metoprolol in the morning only and contact his cardiologist, as the increase in [place] does coincide with his fatigue.   -     EKG 12-Lead  Primary osteoarthritis of left knee: He has a history of same. Tylenol, capsacin, and advised rest.   Other fatigue: See problem 1.     The patient was advised to call or return to clinic if he does not see an improvement in symptoms, or to seek the care of the closest emergency department if he worsens with the above plan.   Mark Gallagher, MHS, PA-C  [facility] 08/13/2015 2:05 PM

## 2015-08-13 NOTE — Patient Instructions (Addendum)
It is okay to take tylenol 1000 mg every 8 hours for knee pain.    For fatigue take only 1 metoprolol in the morning.  If the cardiologist tells me different I will call.     IF you received an x-ray today, you will receive an invoice from [facility]. Please contact [facility] at [phone] with questions or concerns regarding your invoice.   IF you received labwork today, you will receive an invoice from [employer]. Please contact [facility] at [phone] with questions or concerns regarding your invoice.   Our billing staff will not be able to assist you with questions regarding bills from these companies.  You will be contacted with the lab results as soon as they are available. The fastest way to get your results is to activate your My Chart account. Instructions are located on the last page of this paperwork. If you have not heard from [place] regarding the results in 2 weeks, please contact this office.

## 2015-08-27 ENCOUNTER — Ambulatory Visit (INDEPENDENT_AMBULATORY_CARE_PROVIDER_SITE_OTHER): Payer: Managed Care, Other (non HMO) | Admitting: Emergency Medicine

## 2015-08-27 DIAGNOSIS — E220 Acromegaly and pituitary gigantism: Secondary | ICD-10-CM | POA: Diagnosis not present

## 2015-08-27 DIAGNOSIS — E291 Testicular hypofunction: Secondary | ICD-10-CM

## 2015-08-27 MED ORDER — TESTOSTERONE CYPIONATE 200 MG/ML IM SOLN
100.0000 mg | Freq: Once | INTRAMUSCULAR | Status: AC
  Administered 2015-08-27: 100 mg via INTRAMUSCULAR

## 2015-08-27 MED ORDER — OCTREOTIDE ACETATE 30 MG IM KIT: 30.0000 mg | PACK | INTRAMUSCULAR | 0 refills | Status: DC

## 2015-08-27 MED ORDER — OCTREOTIDE ACETATE 30 MG IM KIT
30.0000 mg | PACK | Freq: Once | INTRAMUSCULAR | Status: AC
  Administered 2015-08-27: 30 mg via INTRAMUSCULAR

## 2015-08-27 NOTE — Progress Notes (Signed)
Subjective:     Patient ID: Mark Gallagher, male   DOB: [birth year], 45 y.o.   MRN: [id number]  HPI  Patient here for testosterone 0.5 ml injection and Sandostatin LAR [place] injection only.   Review of Systems     Objective:   Physical Exam     Assessment:         Plan:       Patient not examined. He was given his injection.

## 2015-08-30 ENCOUNTER — Encounter: Payer: Self-pay | Admitting: Emergency Medicine

## 2015-09-07 ENCOUNTER — Ambulatory Visit (INDEPENDENT_AMBULATORY_CARE_PROVIDER_SITE_OTHER): Payer: Managed Care, Other (non HMO) | Admitting: Physician Assistant

## 2015-09-07 VITALS — BP 114/64 | HR 44 | Temp 98.5°F | Resp 16

## 2015-09-07 DIAGNOSIS — E291 Testicular hypofunction: Secondary | ICD-10-CM

## 2015-09-07 MED ORDER — TESTOSTERONE CYPIONATE 200 MG/ML IM SOLN
100.0000 mg | Freq: Once | INTRAMUSCULAR | Status: AC
  Administered 2015-09-07: 100 mg via INTRAMUSCULAR

## 2015-09-07 NOTE — Progress Notes (Signed)
Patient here for Testosterone injection 0.5 ml only.

## 2015-09-21 ENCOUNTER — Ambulatory Visit (INDEPENDENT_AMBULATORY_CARE_PROVIDER_SITE_OTHER): Payer: Managed Care, Other (non HMO) | Admitting: Physician Assistant

## 2015-09-21 DIAGNOSIS — E220 Acromegaly and pituitary gigantism: Secondary | ICD-10-CM

## 2015-09-21 DIAGNOSIS — E291 Testicular hypofunction: Secondary | ICD-10-CM | POA: Diagnosis not present

## 2015-09-21 MED ORDER — TESTOSTERONE CYPIONATE 200 MG/ML IM SOLN: 200.0000 mg | INTRAMUSCULAR | 0 refills | Status: DC

## 2015-09-21 MED ORDER — OCTREOTIDE ACETATE 30 MG IM KIT
30.0000 mg | PACK | Freq: Once | INTRAMUSCULAR | Status: AC
  Administered 2015-09-21: 30 mg via INTRAMUSCULAR

## 2015-09-21 NOTE — Progress Notes (Signed)
Pt here for injections only.  

## 2015-09-25 ENCOUNTER — Encounter
Payer: Managed Care, Other (non HMO) | Attending: Physical Medicine & Rehabilitation | Admitting: Physical Medicine & Rehabilitation

## 2015-09-25 ENCOUNTER — Encounter: Payer: Self-pay | Admitting: Physical Medicine & Rehabilitation

## 2015-09-25 VITALS — BP 116/69 | HR 44 | Resp 17

## 2015-09-25 DIAGNOSIS — M25562 Pain in left knee: Secondary | ICD-10-CM

## 2015-09-25 DIAGNOSIS — M1712 Unilateral primary osteoarthritis, left knee: Secondary | ICD-10-CM | POA: Insufficient documentation

## 2015-09-25 DIAGNOSIS — M4712 Other spondylosis with myelopathy, cervical region: Secondary | ICD-10-CM

## 2015-09-25 MED ORDER — TESTOSTERONE CYPIONATE 200 MG/ML IM SOLN
100.0000 mg | Freq: Once | INTRAMUSCULAR | Status: AC
  Administered 2015-09-21: 100 mg via INTRAMUSCULAR

## 2015-09-25 NOTE — Patient Instructions (Addendum)
1. REMEMBER GOOD STANDING POSTURE---HEAD UP AND SHOULDERS BACK  2. ICE, TYLENOL FOR KNEE PAIN.  YOU CAN TAKE UP TO 2000MG  TYLENOL DAILY FOR PIAN.  3. REMEMBER TO USE GOOD FORM, CANE WHEN YOU WALK.  4. CONSIDER WATER WALKING---CHEST DEEP WATER TO IMPROVED TECHNIQUE, BALANCE, STRENGTH.  5. FORM BEFORE SPEED!!!   6. WORK ON STRENGTHENING YOUR QUADS AND HAMSTRINGS    PLEASE CALL ME WITH ANY PROBLEMS OR QUESTIONS [phone]

## 2015-09-25 NOTE — Progress Notes (Signed)
Subjective:    Patient ID: Mark Gallagher, male    DOB: 09-20-1970, 45 y.o.   MRN: [id number]  HPI   Mark Gallagher is here in follow up of his cervical SCI. He had a heart ablation at [facility], and things have gone well so far. He is wearing a heart monitor currently.  He did develop some increased left knee pain which worsened with gait. He was told he had OA of his left knee and is apparently enrolled to see [facility] for assessment. He is using tylenol rarely for pain. He is wearing a knee brace regularly. He sometimes walks without his brace. He hasn't had therapy since finishing up his post-op/rehab outpatient course.   Pain Inventory Average Pain 3 Pain Right Now 3 My pain is burning  In the last 24 hours, has pain interfered with the following? General activity 3 Relation with others 0 Enjoyment of life 1 What TIME of day is your pain at its worst? morning, evening  Sleep (in general) Fair  Pain is worse with: walking, bending and standing Pain improves with: rest, heat/ice and therapy/exercise Relief from Meds: 0  Mobility use a cane how many minutes can you walk? 20 ability to climb steps?  yes do you drive?  yes transfers alone  Function employed # of hrs/week 43 I need assistance with the following:  household duties and shopping Do you have any goals in this area?  yes  Neuro/Psych trouble walking spasms  Prior Studies x-rays CT/MRI  Physicians involved in your care Primary care . Neurologist . Neurosurgeon .   Family History  Problem Relation Age of Onset  . High blood pressure Mother   . Acute myelogenous leukemia Brother    Social History   Social History  . Marital status: Single    Spouse name: N/A  . Number of children: N/A  . Years of education: N/A   Social History Main Topics  . Smoking status: Never Smoker  . Smokeless tobacco: Never Used  . Alcohol use No  . Drug use: No  . Sexual activity: Not Asked   Other Topics Concern  .  None   Social History Narrative  . None   Past Surgical History:  Procedure Laterality Date  . CERVICAL DISC SURGERY  2007   Anterior C4/5 vertebrectomy and c3-C6 laminoplasty with plating  . CRANIOTOMY FOR TUMOR     pituitary adenoma   Past Medical History:  Diagnosis Date  . 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 Cancer ([place])   . History of pituitary tumor   . Knee pain, bilateral   . Testosterone deficiency   . Thyroid disease    BP 116/69   Pulse (!) 44   Resp 17   SpO2 99%   Opioid Risk Score:   Fall Risk Score:  `1  Depression screen PHQ 2/9  Depression screen [facility] 2/9 03/27/2015 02/18/2015 01/23/2015 10/19/2014 08/23/2014 09/08/2013  Decreased Interest 0 0 0 0 0 0  Down, Depressed, Hopeless 0 0 0 0 0 0  PHQ - 2 Score 0 0 0 0 0 0    Review of Systems  Musculoskeletal: Positive for gait problem.  Neurological:       Spasms   All other systems reviewed and are negative.      Objective:   Physical Exam  Constitutional: He is oriented to person, place, and time. He appears well-developed and well-nourished. Large frame/head  HENT: thrush on tongue  Head: Atraumatic.  Eyes: Conjunctivae and EOM  are normal. Pupils are equal, round, and reactive to light. Right eye exhibits no discharge.  Neck:  Cervical collar fitting appropriately. New pads  Cardiovascular: Normal rate and regular rhythm.  No murmur heard.  Respiratory: Effort normal and breath sounds normal. No respiratory distress. He has no wheezes. He exhibits no tenderness.  GI: Soft. Bowel sounds are normal. He exhibits no distension. There is no tenderness.  Musculoskeletal: He exhibits improving. no edema left hand Pain over left pec with palpation is improved Neurological: He is alert and oriented to person, place, and time. RUE: 5/5 deltoid, bicep, tricep, HI. LUE: 4+ to 5/5 delt, bicep, tricep, 4+-5wrist and HI. Positive left pronator drift. Sensation 1+/2 LUE and LLE. RLE: 4 HF, 4 to 4+/5 KE and ADF/APF. LLE: 4+HF and KE  and  2/5 ADF/APF. Sensation 1++/2. Excessive left pelvic tilt with stance and weight bearing which his much less pronounced with his cane in hand. There is mild recurvatum on the left with weightbearing also. He tends to walk too fast without his cane. Skin: Skin is warm and dry.  Psychiatric: He has a normal mood and affect. His behavior is normal. Judgment and thought content normal.   Assessment & Plan:   Medical Problem List and Plan:  1. Functional deficits secondary to C6 SCI with incomplete myelopathy  -continue with outpt therapies to completion -left knee brace to for support -straight cane for balance   -reviewed the importance of proper mechanics moving forward. 3. Pain Management:   -discussed use of ice  -tylenol prn   -HEP 4. Hypogonadism: Depotestosterone per home regimen--needs to check with endocrine about labwork. May need adjustment in thyroid or testosterone supplementation. 13. H/o Acromegaly: Treated with Sandostatin?  14. Left knee pain, likely OA---  -discussed knee strengthening exercises  -ice/tylenol for pain  -water walking and the importance of mechanics and techniques  -can pursue further mgt of the knee if her prefers here at this clinic     Follow up with me in 3 months. 25 minutes of face to face patient care time were spent during this visit. All questions were encouraged and answered.

## 2015-10-08 ENCOUNTER — Ambulatory Visit (INDEPENDENT_AMBULATORY_CARE_PROVIDER_SITE_OTHER): Payer: Managed Care, Other (non HMO) | Admitting: Emergency Medicine

## 2015-10-08 DIAGNOSIS — E291 Testicular hypofunction: Secondary | ICD-10-CM | POA: Diagnosis not present

## 2015-10-08 MED ORDER — TESTOSTERONE CYPIONATE 200 MG/ML IM SOLN
100.0000 mg | Freq: Once | INTRAMUSCULAR | Status: AC
  Administered 2015-10-08: 100 mg via INTRAMUSCULAR

## 2015-10-08 NOTE — Progress Notes (Signed)
Patient seen here for injection only.

## 2015-10-08 NOTE — Patient Instructions (Signed)
Pt here for testosterone cypionate 200mg /ml injection.  Pt receives 0.5mg  (100 mg) injection only.

## 2015-10-19 ENCOUNTER — Ambulatory Visit (INDEPENDENT_AMBULATORY_CARE_PROVIDER_SITE_OTHER): Payer: Managed Care, Other (non HMO) | Admitting: Physician Assistant

## 2015-10-19 VITALS — BP 110/80 | HR 95 | Temp 97.6°F

## 2015-10-19 DIAGNOSIS — E291 Testicular hypofunction: Secondary | ICD-10-CM | POA: Diagnosis not present

## 2015-10-19 DIAGNOSIS — E220 Acromegaly and pituitary gigantism: Secondary | ICD-10-CM | POA: Diagnosis not present

## 2015-10-19 MED ORDER — TESTOSTERONE CYPIONATE 200 MG/ML IM SOLN
100.0000 mg | Freq: Once | INTRAMUSCULAR | Status: AC
  Administered 2015-10-19: 100 mg via INTRAMUSCULAR

## 2015-10-19 MED ORDER — OCTREOTIDE ACETATE 30 MG IM KIT
30.0000 mg | PACK | Freq: Once | INTRAMUSCULAR | Status: AC
  Administered 2015-10-19: 30 mg via INTRAMUSCULAR

## 2015-10-19 NOTE — Patient Instructions (Signed)
Pt seen here for injections only.

## 2015-10-20 NOTE — Progress Notes (Signed)
Lab only 

## 2015-11-05 ENCOUNTER — Ambulatory Visit (INDEPENDENT_AMBULATORY_CARE_PROVIDER_SITE_OTHER): Payer: Managed Care, Other (non HMO) | Admitting: Family Medicine

## 2015-11-05 DIAGNOSIS — E291 Testicular hypofunction: Secondary | ICD-10-CM

## 2015-11-05 MED ORDER — TESTOSTERONE CYPIONATE 200 MG/ML IM SOLN
100.0000 mg | Freq: Once | INTRAMUSCULAR | Status: AC
  Administered 2015-11-05: 100 mg via INTRAMUSCULAR

## 2015-11-05 NOTE — Progress Notes (Signed)
Meds ordered this encounter  Medications  . testosterone cypionate (DEPOTESTOSTERONE CYPIONATE) injection 100 mg

## 2015-11-17 ENCOUNTER — Encounter (HOSPITAL_COMMUNITY): Payer: Self-pay | Admitting: Emergency Medicine

## 2015-11-17 ENCOUNTER — Ambulatory Visit (HOSPITAL_COMMUNITY)
Admission: EM | Admit: 2015-11-17 | Discharge: 2015-11-17 | Disposition: A | Discharge status: Home / Self Care | Payer: Managed Care, Other (non HMO) | Attending: Internal Medicine | Admitting: Internal Medicine

## 2015-11-17 DIAGNOSIS — J060 Acute laryngopharyngitis: Secondary | ICD-10-CM | POA: Diagnosis not present

## 2015-11-17 MED ORDER — AMOXICILLIN 500 MG PO CAPS: 500.0000 mg | ORAL_CAPSULE | Freq: Three times a day (TID) | ORAL | 0 refills | Status: DC

## 2015-11-17 NOTE — ED Provider Notes (Signed)
CSN: [id number]     Arrival date & time 11/17/15  1802 History   First MD Initiated Contact with Patient 11/17/15 1822     Chief Complaint  Patient presents with  . Sore Throat   (Consider location/radiation/quality/duration/timing/severity/associated sxs/prior Treatment) HPI NP THIS IS A NEW PROBLEM PT PRESENTS WITH 2 DAY HISTORY OF CONGESTION, RUNNY NOSE, COUGH AND WHEEZING ALONG WITH BODY ACHES. HAS USED MULTIPLE OTC MEDS WITHOUT RELIEF OF SYMPTOMS. DENIES FEVER. COUGH IS DRY NON PRODUCTIVE. NON SMOKER.  PREVIOUS SYMPTOMS OF THIS NATURE. OTHERS AROUND PATIENT HAS BEEN ILL. SOME CHEST PAIN DUE TO COUGH. PAIN SCORE 2.    Past Medical History:  Diagnosis Date  . 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 Cancer ([place])   . History of pituitary tumor   . Knee pain, bilateral   . Testosterone deficiency   . Thyroid disease    Past Surgical History:  Procedure Laterality Date  . CARDIAC ELECTROPHYSIOLOGY STUDY AND ABLATION    . CERVICAL DISC SURGERY  2007   Anterior C4/5 vertebrectomy and c3-C6 laminoplasty with plating  . CRANIOTOMY FOR TUMOR     pituitary adenoma   Family History  Problem Relation Age of Onset  . High blood pressure Mother   . Acute myelogenous leukemia Brother    Social History  Substance Use Topics  . Smoking status: Never Smoker  . Smokeless tobacco: Never Used  . Alcohol use No    Review of Systems  Denies: HEADACHE, NAUSEA, ABDOMINAL PAIN, CHEST PAIN,   DYSURIA, SHORTNESS OF BREATH  Allergies  Oxycodone  Home Medications   Prior to Admission medications   Medication Sig Start Date End Date Taking? Authorizing Provider  amiodarone (PACERONE) 400 MG tablet Take by mouth. 09/20/15 11/17/15 Yes Historical Provider, MD  Cholecalciferol (VITAMIN D3) 2000 UNITS capsule Take by mouth.   Yes Historical Provider, MD  ferrous sulfate 325 (65 FE) MG tablet Take 1 tablet (325 mg total) by mouth 2 (two) times daily with a meal. 10/05/14  Yes Ivan Anchors Love, PA-C  fluticasone  (FLONASE) 50 MCG/ACT nasal spray Place 2 sprays into both nostrils daily. 10/05/14  Yes Ivan Anchors Love, PA-C  hydrocortisone (CORTEF) 20 MG tablet Take 20 mg in am and 10 mg in evenings 10/05/14  Yes Ivan Anchors Love, PA-C  levothyroxine (SYNTHROID, LEVOTHROID) 200 MCG tablet Take 1 tablet (200 mcg total) by mouth daily. 10/05/14  Yes Ivan Anchors Love, PA-C  Menthol-Methyl Salicylate (MUSCLE RUB) 10-15 % CREA Apply 1 application topically as needed for muscle pain. 10/05/14  Yes Ivan Anchors Love, PA-C  metoprolol succinate (TOPROL-XL) 25 MG 24 hr tablet Take 25 mg by mouth daily.   Yes Historical Provider, MD  octreotide (SANDOSTATIN LAR DEPOT) 30 MG injection Inject 30 mg into the muscle every 28 (twenty-eight) days. 12/11/11  Yes Darlyne Russian, MD  testosterone cypionate (DEPOTESTOSTERONE CYPIONATE) 200 MG/ML injection Inject 1 mL (200 mg total) into the muscle every 14 (fourteen) days. 09/21/15  Yes Darlyne Russian, MD  amoxicillin (AMOXIL) 500 MG capsule Take 1 capsule (500 mg total) by mouth 3 (three) times daily. 11/17/15   Konrad Felix, PA   Meds Ordered and Administered this Visit  Medications - No data to display  BP 119/62 (BP Location: Left Arm)   Pulse 64   Temp 98.3 F (36.8 C) (Oral) Comment: Normal 97.6 for patient  Resp 20   SpO2 98%  No data found.   Physical Exam NURSES NOTES AND VITAL SIGNS REVIEWED.  CONSTITUTIONAL: Well developed, well nourished, no acute distress HEENT: normocephalic, atraumatic EYES: Conjunctiva normal NECK:normal ROM, supple, no adenopathy PULMONARY:No respiratory distress, normal effort ABDOMINAL: Soft, ND, NT BS+, No CVAT MUSCULOSKELETAL: Normal ROM of all extremities,  SKIN: warm and dry without rash PSYCHIATRIC: Mood and affect, behavior are normal  Urgent Care Course   Clinical Course    Procedures (including critical care time)  Labs Review Labs Reviewed - No data to display  Imaging Review No results found.   Visual Acuity Review  Right  Eye Distance:   Left Eye Distance:   Bilateral Distance:    Right Eye Near:   Left Eye Near:    Bilateral Near:         MDM   1. Acute laryngopharyngitis     Patient is reassured that there are no issues that require transfer to higher level of care at this time or additional tests. Patient is advised to continue home symptomatic treatment. Patient is advised that if there are new or worsening symptoms to attend the emergency department, contact primary care provider, or return to UC. Instructions of care provided discharged home in stable condition.    THIS NOTE WAS GENERATED USING A VOICE RECOGNITION SOFTWARE PROGRAM. ALL REASONABLE EFFORTS  WERE MADE TO PROOFREAD THIS DOCUMENT FOR ACCURACY.  I have verbally reviewed the discharge instructions with the patient. A printed AVS was given to the patient.  All questions were answered prior to discharge.      Konrad Felix, [place] 11/17/15 [phone]

## 2015-11-17 NOTE — ED Triage Notes (Signed)
The patient presented to the [facility] with a complaint of a sore throat and general feeling of weakness and body aches that started today.

## 2015-11-17 NOTE — Discharge Instructions (Signed)
PUSH FLUIDS   ACTIVITY AS TOLERATED.

## 2015-11-19 ENCOUNTER — Ambulatory Visit (INDEPENDENT_AMBULATORY_CARE_PROVIDER_SITE_OTHER): Payer: Managed Care, Other (non HMO) | Admitting: Family Medicine

## 2015-11-19 VITALS — BP 110/70 | HR 69 | Temp 97.8°F | Resp 18 | Ht >= 80 in | Wt 238.6 lb

## 2015-11-19 DIAGNOSIS — E220 Acromegaly and pituitary gigantism: Secondary | ICD-10-CM | POA: Diagnosis not present

## 2015-11-19 DIAGNOSIS — E291 Testicular hypofunction: Secondary | ICD-10-CM | POA: Diagnosis not present

## 2015-11-19 DIAGNOSIS — B9789 Other viral agents as the cause of diseases classified elsewhere: Secondary | ICD-10-CM | POA: Diagnosis not present

## 2015-11-19 DIAGNOSIS — J069 Acute upper respiratory infection, unspecified: Secondary | ICD-10-CM | POA: Diagnosis not present

## 2015-11-19 MED ORDER — OCTREOTIDE ACETATE 30 MG IM KIT
30.0000 mg | PACK | Freq: Once | INTRAMUSCULAR | Status: AC
  Administered 2015-11-19: 30 mg via INTRAMUSCULAR

## 2015-11-19 MED ORDER — TESTOSTERONE CYPIONATE 200 MG/ML IM SOLN
100.0000 mg | Freq: Once | INTRAMUSCULAR | Status: AC
  Administered 2015-11-19: 100 mg via INTRAMUSCULAR

## 2015-11-19 NOTE — Patient Instructions (Addendum)
     IF you received an x-ray today, you will receive an invoice from [facility]. Please contact [facility] at [phone] with questions or concerns regarding your invoice.   IF you received labwork today, you will receive an invoice from [employer]. Please contact [facility] at [phone] with questions or concerns regarding your invoice.   Our billing staff will not be able to assist you with questions regarding bills from these companies.  You will be contacted with the lab results as soon as they are available. The fastest way to get your results is to activate your My Chart account. Instructions are located on the last page of this paperwork. If you have not heard from [place] regarding the results in 2 weeks, please contact this office.     Viral Infections A viral infection can be caused by different types of viruses.Most viral infections are not serious and resolve on their own. However, some infections may cause severe symptoms and may lead to further complications. SYMPTOMS Viruses can frequently cause:  Minor sore throat.  Aches and pains.  Headaches.  Runny nose.  Different types of rashes.  Watery eyes.  Tiredness.  Cough.  Loss of appetite.  Gastrointestinal infections, resulting in nausea, vomiting, and diarrhea. These symptoms do not respond to antibiotics because the infection is not caused by bacteria. However, you might catch a bacterial infection following the viral infection. This is sometimes called a "superinfection." Symptoms of such a bacterial infection may include:  Worsening sore throat with pus and difficulty swallowing.  Swollen neck glands.  Chills and a high or persistent fever.  Severe headache.  Tenderness over the sinuses.  Persistent overall ill feeling (malaise), muscle aches, and tiredness (fatigue).  Persistent cough.  Yellow, green, or brown mucus production with coughing. HOME  CARE INSTRUCTIONS   Only take over-the-counter or prescription medicines for pain, discomfort, diarrhea, or fever as directed by your caregiver.  Drink enough water and fluids to keep your urine clear or pale yellow. Sports drinks can provide valuable electrolytes, sugars, and hydration.  Get plenty of rest and maintain proper nutrition. Soups and broths with crackers or rice are fine. SEEK IMMEDIATE MEDICAL CARE IF:   You have severe headaches, shortness of breath, chest pain, neck pain, or an unusual rash.  You have uncontrolled vomiting, diarrhea, or you are unable to keep down fluids.  You or your child has an oral temperature above 102 F (38.9 C), not controlled by medicine.  Your baby is older than 3 months with a rectal temperature of 102 F (38.9 C) or higher.  Your baby is 14 months old or younger with a rectal temperature of 100.4 F (38 C) or higher. MAKE SURE YOU:   Understand these instructions.  Will watch your condition.  Will get help right away if you are not doing well or get worse.   This information is not intended to replace advice given to you by your health care provider. Make sure you discuss any questions you have with your health care provider.   Document Released: 10/16/2004 Document Revised: 03/31/2011 Document Reviewed: 06/14/2014 Elsevier Interactive Patient Education [employer].

## 2015-11-19 NOTE — Progress Notes (Signed)
Chief Complaint  Patient presents with  . URI    for the past few days, was seen at the Urgent Care by [facility] but still isn't feeling better.     HPI    Pt is on amoxicillin  He continues to have sore throat and cough He is aching in his legs He denies fevers or chills He does not feel congested He stayed home yesterday.    Past Medical History:  Diagnosis Date  . 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 Cancer ([place])   . History of pituitary tumor   . Knee pain, bilateral   . Testosterone deficiency   . Thyroid disease     Current Outpatient Prescriptions  Medication Sig Dispense Refill  . amoxicillin (AMOXIL) 500 MG capsule Take 1 capsule (500 mg total) by mouth 3 (three) times daily. 21 capsule 0  . Cholecalciferol (VITAMIN D3) 2000 UNITS capsule Take by mouth.    . ferrous sulfate 325 (65 FE) MG tablet Take 1 tablet (325 mg total) by mouth 2 (two) times daily with a meal. 60 tablet 1  . hydrocortisone (CORTEF) 20 MG tablet Take 20 mg in am and 10 mg in evenings    . levothyroxine (SYNTHROID, LEVOTHROID) 200 MCG tablet Take 1 tablet (200 mcg total) by mouth daily.    . Menthol-Methyl Salicylate (MUSCLE RUB) 10-15 % CREA Apply 1 application topically as needed for muscle pain.  0  . metoprolol succinate (TOPROL-XL) 25 MG 24 hr tablet Take 25 mg by mouth daily.    Marland Kitchen octreotide (SANDOSTATIN LAR DEPOT) 30 MG injection Inject 30 mg into the muscle every 28 (twenty-eight) days. 1 each 0  . testosterone cypionate (DEPOTESTOSTERONE CYPIONATE) 200 MG/ML injection Inject 1 mL (200 mg total) into the muscle every 14 (fourteen) days. 10 mL 0  . amiodarone (PACERONE) 400 MG tablet Take by mouth.     Current Facility-Administered Medications  Medication Dose Route Frequency Provider Last Rate Last Dose  . testosterone cypionate (DEPOTESTOSTERONE CYPIONATE) injection 100 mg  100 mg Intramuscular Once Darlyne Russian, MD        Allergies:  Allergies  Allergen Reactions  . Oxycodone     Nausea?     Past Surgical History:  Procedure Laterality Date  . CARDIAC ELECTROPHYSIOLOGY STUDY AND ABLATION    . CERVICAL DISC SURGERY  2007   Anterior C4/5 vertebrectomy and c3-C6 laminoplasty with plating  . CRANIOTOMY FOR TUMOR     pituitary adenoma    Social History   Social History  . Marital status: Single    Spouse name: N/A  . Number of children: N/A  . Years of education: N/A   Social History Main Topics  . Smoking status: Never Smoker  . Smokeless tobacco: Never Used  . Alcohol use No  . Drug use: No  . Sexual activity: Not Asked   Other Topics Concern  . None   Social History Narrative  . None    ROS  Objective: Vitals:   11/19/15 0935  BP: 110/70  Pulse: 69  Resp: 18  Temp: 97.8 F (36.6 C)  TempSrc: Oral  SpO2: 97%  Weight: 238 lb 9.6 oz (108.2 kg)  Height: 6\' 8"  (2.032 m)    Physical Exam General: alert, oriented, in NAD Head: acromegalic, no sinus tenderness Eyes: EOM intact, no scleral icterus or conjunctival injection Ears: TM clear bilaterally Throat: no pharyngeal exudate or erythema Lymph: no posterior auricular, submental or cervical lymph adenopathy Heart: normal rate, normal sinus rhythm,  no murmurs Lungs: clear to auscultation bilaterally, no wheezing   Assessment and Plan Mrk was seen today for uri.  Diagnoses and all orders for this visit:  Viral URI- continue current meds and treatment plan from urgent care Amoxicillin was given for laryngosinusitis  Hypogonadism in male- continue current supplementation -     testosterone cypionate (DEPOTESTOSTERONE CYPIONATE) injection 100 mg; Inject 0.5 mLs (100 mg total) into the muscle once.  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     Mark Gallagher

## 2015-11-21 ENCOUNTER — Encounter (HOSPITAL_COMMUNITY): Payer: Self-pay | Admitting: *Deleted

## 2015-11-21 ENCOUNTER — Ambulatory Visit (HOSPITAL_COMMUNITY)
Admission: EM | Admit: 2015-11-21 | Discharge: 2015-11-21 | Disposition: A | Discharge status: Home / Self Care | Payer: Managed Care, Other (non HMO) | Attending: Family Medicine | Admitting: Family Medicine

## 2015-11-21 DIAGNOSIS — R69 Illness, unspecified: Secondary | ICD-10-CM

## 2015-11-21 DIAGNOSIS — J111 Influenza due to unidentified influenza virus with other respiratory manifestations: Secondary | ICD-10-CM

## 2015-11-21 DIAGNOSIS — R197 Diarrhea, unspecified: Secondary | ICD-10-CM | POA: Diagnosis not present

## 2015-11-21 MED ORDER — DIPHENOXYLATE-ATROPINE 2.5-0.025 MG/5ML PO LIQD: 10.0000 mL | Freq: Four times a day (QID) | ORAL | 0 refills | Status: DC | PRN

## 2015-11-21 NOTE — ED Triage Notes (Signed)
Pt  Has  Been  Seen   X  3     Over  Last  5  Days  For  sorethroat  Cough   Congested      Seen      By  pcp  2  Days   -  Pt  C/o  Weakness  Body  Aches   Had    Fever     Earlier         Today           He  [place]  As  Well  Today      And    Has  An  Abrasion to  His  Nose      He  Developed   An  Episode  Of  Diarrhea  Today as  Well     -       He   Is  Awake  And  Alert his father  Is  With  Him at  This  Time

## 2015-11-21 NOTE — ED Provider Notes (Signed)
[place]    CSN: [id number] Arrival date & time: 11/21/15  1828     History   Chief Complaint Chief Complaint  Patient presents with  . Diarrhea    HPI Mark Gallagher is a 45 y.o. male.   The history is provided by the patient and a parent.  Diarrhea  Quality:  Watery Severity:  Mild Onset quality:  Sudden Progression:  Unchanged Relieved by:  Nothing Worsened by:  Nothing Ineffective treatments:  None tried Associated symptoms: myalgias   Associated symptoms: no abdominal pain, no fever and no vomiting   Risk factors: recent antibiotic use     Past Medical History:  Diagnosis Date  . 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 Cancer ([place])   . History of pituitary tumor   . Knee pain, bilateral   . Testosterone deficiency   . Thyroid disease     Patient Active Problem List   Diagnosis Date Noted  . Left knee pain 09/25/2015  . Adjustment disorder with depressed mood   . Tetraplegia ([place]) 09/19/2014  . Acute blood loss anemia 09/19/2014  . Cervical spondylosis with myelopathy 09/18/2014  . Radiculopathy of arm 08/28/2014  . Hypogonadism male 09/08/2013  . 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 Cardiomyopathy 04/01/2011  . Wolff-Parkinson-White (WPW) syndrome 04/01/2011    Past Surgical History:  Procedure Laterality Date  . CARDIAC ELECTROPHYSIOLOGY STUDY AND ABLATION    . CERVICAL DISC SURGERY  2007   Anterior C4/5 vertebrectomy and c3-C6 laminoplasty with plating  . CRANIOTOMY FOR TUMOR     pituitary adenoma       Home Medications    Prior to Admission medications   Medication Sig Start Date End Date Taking? Authorizing Provider  amiodarone (PACERONE) 400 MG tablet Take by mouth. 09/20/15 11/17/15  Historical Provider, MD  amoxicillin (AMOXIL) 500 MG capsule Take 1 capsule (500 mg total) by mouth 3 (three) times daily. 11/17/15   Konrad Felix, PA  Cholecalciferol (VITAMIN D3) 2000 UNITS capsule Take by mouth.    Historical Provider, MD  ferrous sulfate  325 (65 FE) MG tablet Take 1 tablet (325 mg total) by mouth 2 (two) times daily with a meal. 10/05/14   Ivan Anchors Love, PA-C  hydrocortisone (CORTEF) 20 MG tablet Take 20 mg in am and 10 mg in evenings 10/05/14   Ivan Anchors Love, PA-C  levothyroxine (SYNTHROID, LEVOTHROID) 200 MCG tablet Take 1 tablet (200 mcg total) by mouth daily. 10/05/14   Bary Leriche, PA-C  Menthol-Methyl Salicylate (MUSCLE RUB) 10-15 % CREA Apply 1 application topically as needed for muscle pain. 10/05/14   Bary Leriche, PA-C  metoprolol succinate (TOPROL-XL) 25 MG 24 hr tablet Take 25 mg by mouth daily.    Historical Provider, MD  octreotide (SANDOSTATIN LAR DEPOT) 30 MG injection Inject 30 mg into the muscle every 28 (twenty-eight) days. 12/11/11   Darlyne Russian, MD  testosterone cypionate (DEPOTESTOSTERONE CYPIONATE) 200 MG/ML injection Inject 1 mL (200 mg total) into the muscle every 14 (fourteen) days. 09/21/15   Darlyne Russian, MD    Family History Family History  Problem Relation Age of Onset  . High blood pressure Mother   . Acute myelogenous leukemia Brother     Social History Social History  Substance Use Topics  . Smoking status: Never Smoker  . Smokeless tobacco: Never Used  . Alcohol use No     Allergies   Oxycodone   Review of Systems Review of Systems  Constitutional: Negative.  Negative for fever.  HENT: Positive for congestion and postnasal drip.   Respiratory: Positive for cough.   Gastrointestinal: Positive for diarrhea. Negative for abdominal pain, blood in stool, nausea and vomiting.  Musculoskeletal: Positive for myalgias.  Skin: Negative for rash.  All other systems reviewed and are negative.    Physical Exam Triage Vital Signs ED Triage Vitals  Enc Vitals Group     BP 11/21/15 1936 109/74     Pulse Rate 11/21/15 1936 67     Resp 11/21/15 1936 14     Temp 11/21/15 1936 97.9 F (36.6 C)     Temp Source 11/21/15 1936 Oral     SpO2 11/21/15 1936 97 %     Weight --      Height --       Head Circumference --      Peak Flow --      Pain Score 11/21/15 1944 2     Pain Loc --      Pain Edu? --      Excl. in [place]? --    No data found.   Updated Vital Signs BP 109/74 (BP Location: Right Arm)   Pulse 67   Temp 97.9 F (36.6 C) (Oral)   Resp 14   SpO2 97%   Visual Acuity Right Eye Distance:   Left Eye Distance:   Bilateral Distance:    Right Eye Near:   Left Eye Near:    Bilateral Near:     Physical Exam  Constitutional: He appears well-developed and well-nourished. No distress.  HENT:  Mouth/Throat: Oropharynx is clear and moist.  Eyes: EOM are normal. Pupils are equal, round, and reactive to light.  Neck: Normal range of motion. Neck supple.  Cardiovascular: Normal rate, regular rhythm, normal heart sounds and intact distal pulses.   Pulmonary/Chest: Effort normal and breath sounds normal.  Abdominal: Soft. Bowel sounds are normal. He exhibits no mass. There is no tenderness. There is no guarding.  Lymphadenopathy:    He has no cervical adenopathy.  Neurological: He is alert.  Skin: Skin is warm and dry.  Nursing note and vitals reviewed.    UC Treatments / Results  Labs (all labs ordered are listed, but only abnormal results are displayed) Labs Reviewed - No data to display  EKG  EKG Interpretation None       Radiology No results found.  Procedures Procedures (including critical care time)  Medications Ordered in UC Medications - No data to display   Initial Impression / Assessment and Plan / UC Course  I have reviewed the triage vital signs and the nursing notes.  Pertinent labs & imaging results that were available during my care of the patient were reviewed by me and considered in my medical decision making (see chart for details).  Clinical Course      Final Clinical Impressions(s) / UC Diagnoses   Final diagnoses:  None    New Prescriptions New Prescriptions   No medications on file     Billy Fischer,  MD 11/21/15 2023

## 2015-12-07 ENCOUNTER — Ambulatory Visit (INDEPENDENT_AMBULATORY_CARE_PROVIDER_SITE_OTHER): Payer: Managed Care, Other (non HMO) | Admitting: Family Medicine

## 2015-12-07 DIAGNOSIS — E291 Testicular hypofunction: Secondary | ICD-10-CM

## 2015-12-07 NOTE — Patient Instructions (Signed)
     IF you received an x-ray today, you will receive an invoice from [facility]. Please contact  Radiology at [phone] with questions or concerns regarding your invoice.   IF you received labwork today, you will receive an invoice from [facility]. Please contact [facility] at [phone] with questions or concerns regarding your invoice.   Our billing staff will not be able to assist you with questions regarding bills from these companies.  You will be contacted with the lab results as soon as they are available. The fastest way to get your results is to activate your My Chart account. Instructions are located on the last page of this paperwork. If you have not heard from us regarding the results in 2 weeks, please contact this office.      

## 2015-12-17 ENCOUNTER — Ambulatory Visit: Payer: Managed Care, Other (non HMO)

## 2015-12-18 ENCOUNTER — Ambulatory Visit (INDEPENDENT_AMBULATORY_CARE_PROVIDER_SITE_OTHER): Payer: Managed Care, Other (non HMO) | Admitting: Physician Assistant

## 2015-12-18 ENCOUNTER — Other Ambulatory Visit: Payer: Self-pay | Admitting: Family Medicine

## 2015-12-18 VITALS — BP 108/72

## 2015-12-18 DIAGNOSIS — E291 Testicular hypofunction: Secondary | ICD-10-CM | POA: Diagnosis not present

## 2015-12-18 DIAGNOSIS — E220 Acromegaly and pituitary gigantism: Secondary | ICD-10-CM

## 2015-12-18 MED ORDER — OCTREOTIDE ACETATE 30 MG IM KIT
30.0000 mg | PACK | INTRAMUSCULAR | Status: AC
  Administered 2015-12-18 – 2017-03-13 (×6): 30 mg via INTRAMUSCULAR

## 2015-12-18 NOTE — Progress Notes (Signed)
Patient ID: Mark Gallagher, male   DOB: [birth year], 45 y.o.   MRN: [id number] Patient here for Sandostatin LAR Depot 30 mg injection only.

## 2015-12-18 NOTE — Progress Notes (Signed)
Immunization encounter only. 

## 2015-12-22 ENCOUNTER — Ambulatory Visit (INDEPENDENT_AMBULATORY_CARE_PROVIDER_SITE_OTHER): Payer: Managed Care, Other (non HMO) | Admitting: Physician Assistant

## 2015-12-22 DIAGNOSIS — E291 Testicular hypofunction: Secondary | ICD-10-CM

## 2015-12-22 MED ORDER — TESTOSTERONE CYPIONATE 200 MG/ML IM SOLN: 200.0000 mg | INTRAMUSCULAR | 0 refills | Status: DC

## 2015-12-25 ENCOUNTER — Encounter
Payer: Managed Care, Other (non HMO) | Attending: Physical Medicine & Rehabilitation | Admitting: Physical Medicine & Rehabilitation

## 2015-12-25 ENCOUNTER — Encounter: Payer: Self-pay | Admitting: Physical Medicine & Rehabilitation

## 2015-12-25 VITALS — BP 100/70 | HR 58 | Resp 14

## 2015-12-25 DIAGNOSIS — M1712 Unilateral primary osteoarthritis, left knee: Secondary | ICD-10-CM | POA: Diagnosis present

## 2015-12-25 DIAGNOSIS — G8929 Other chronic pain: Secondary | ICD-10-CM | POA: Diagnosis not present

## 2015-12-25 DIAGNOSIS — M25562 Pain in left knee: Secondary | ICD-10-CM | POA: Diagnosis not present

## 2015-12-25 DIAGNOSIS — M4712 Other spondylosis with myelopathy, cervical region: Secondary | ICD-10-CM | POA: Diagnosis not present

## 2015-12-25 NOTE — Patient Instructions (Signed)
PLEASE CALL ME WITH ANY PROBLEMS OR QUESTIONS ([phone])   HAPPY HOLIDAYS!!!!                    *                * *             *   *   *         *  *   *  *  *     *  *  *  *  *  *  * *  *  *  *  *  *  *  *  *  * *               *  *               *  *               *  *  

## 2015-12-25 NOTE — Progress Notes (Signed)
Subjective:    Patient ID: Mark PIETERS, male    DOB: [birth year], 45 y.o.   MRN: [id number]  HPI   Viren is here in follow of his cervical myelopathy. He has continued to work near full time. He uses a cane for balance. He's had two recent falls, one where he fell standing on a threshold and another one where he caught his foot on a mat in the house.    Pain Inventory Average Pain 5 Pain Right Now 1 My pain is intermittent, sharp, burning, stabbing and tingling  In the last 24 hours, has pain interfered with the following? General activity 1 Relation with others 0 Enjoyment of life 0 What TIME of day is your pain at its worst? night Sleep (in general) Fair  Pain is worse with: walking Pain improves with: therapy/exercise Relief from Meds: "Good"  Mobility walk without assistance walk with assistance use a cane how many minutes can you walk? 20 ability to climb steps?  yes do you drive?  yes Do you have any goals in this area?  yes  Function not employed: date last employed 42 I need assistance with the following:  meal prep and household duties Do you have any goals in this area?  yes  Neuro/Psych numbness trouble walking  Prior Studies Any changes since last visit?  no  Physicians involved in your care Any changes since last visit?  no   Family History  Problem Relation Age of Onset  . High blood pressure Mother   . Acute myelogenous leukemia Brother    Social History   Social History  . Marital status: Single    Spouse name: N/A  . Number of children: N/A  . Years of education: N/A   Social History Main Topics  . Smoking status: Never Smoker  . Smokeless tobacco: Never Used  . Alcohol use No  . Drug use: No  . Sexual activity: Not Asked   Other Topics Concern  . None   Social History Narrative  . None   Past Surgical History:  Procedure Laterality Date  . CARDIAC ELECTROPHYSIOLOGY STUDY AND ABLATION    . CERVICAL DISC SURGERY   2007   Anterior C4/5 vertebrectomy and c3-C6 laminoplasty with plating  . CRANIOTOMY FOR TUMOR     pituitary adenoma   Past Medical History:  Diagnosis Date  . 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 Cancer ([place])   . History of pituitary tumor   . Knee pain, bilateral   . Testosterone deficiency   . Thyroid disease    BP 100/70   Pulse (!) 58   Resp 14   SpO2 98%   Opioid Risk Score:   Fall Risk Score:  `1  Depression screen PHQ 2/9  Depression screen [facility] 2/9 11/19/2015 03/27/2015 02/18/2015 01/23/2015 10/19/2014 08/23/2014 09/08/2013  Decreased Interest 0 0 0 0 0 0 0  Down, Depressed, Hopeless 0 0 0 0 0 0 0  PHQ - 2 Score 0 0 0 0 0 0 0     Review of Systems  Constitutional: Negative.   HENT: Negative.   Eyes: Negative.   Respiratory: Negative.   Cardiovascular: Negative.   Gastrointestinal: Negative.   Endocrine: Negative.   Genitourinary: Negative.   Musculoskeletal: Negative.   Skin: Negative.   Allergic/Immunologic: Negative.   Neurological: Negative.   Hematological: Negative.   Psychiatric/Behavioral: Negative.   All other systems reviewed and are negative.      Objective:   Physical Exam  Constitutional: He is oriented to person, place, and time. He appears well-developed and well-nourished. Large frame/head  HENT: thrush on tongue  Head: Atraumatic.  Eyes: disconjugate gaze Neck:supple Cardiovascular: RRR.  No murmur heard.  Respiratory: ctass.  GI: Soft. Bowel sounds are normal. He exhibits no distension. There is no tenderness.  Musculoskeletal: He exhibits improving. no edema left hand Pain over left pec with palpation is improved Neurological: He is alert and oriented to person, place, and time. RUE: 5/5 deltoid, bicep, tricep, HI. LUE: 4+ to 5/5 delt, bicep, tricep, 5-/5wrist and HI.Positive left pronator drift. Sensation 1+/2 LUE and LLE. RLE: 4 HF, 4 to 4+/5 KE and ADF/APF. LLE: 4+HF and KE and  3/5 ADF/4/5APF. Sensation 1++/2.  Still tilts pelvis to right with  kinee flexion during swing of gait. He is able to correct somewhat when cued.  Continued mild recurvatum/ on the left with weightbearing also.mild left genu varus. . Skin: Skin is warm and dry.  Psychiatric: He has a normal mood and affect. His behavior is normal. Judgment and thought content normal.   Assessment & Plan:  Medical Problem List and Plan: 1. Functional deficits secondary to C6 SCI with incomplete myelopathy  -will re-visit therapy to address gait/balance/form -left knee brace to for support is needed -straight cane for balance. Consider use of quad cane? -needs HEP also. Provided extensive knee strengthening exercises today which we reviewed 3. Pain Management:              -tylenol prn              -HEP 4. Hypogonadism: Depotestosterone per home regimen--needs to check with endocrine about labwork. May need adjustment in thyroid or testosterone supplementation. 13. H/o Acromegaly: Treated with Sandostatin  14. Left knee pain, likely OA---             -knee strengthening exercises provided             -ice/tylenol for pain    Follow up with me in 3 months. 25 minutes of face to face patient care time were spent during this visit. All questions were encouraged and answered.

## 2016-01-11 ENCOUNTER — Ambulatory Visit (INDEPENDENT_AMBULATORY_CARE_PROVIDER_SITE_OTHER): Payer: Managed Care, Other (non HMO) | Admitting: Physician Assistant

## 2016-01-11 VITALS — BP 112/66 | Temp 97.9°F

## 2016-01-11 DIAGNOSIS — E220 Acromegaly and pituitary gigantism: Secondary | ICD-10-CM

## 2016-01-11 DIAGNOSIS — E291 Testicular hypofunction: Secondary | ICD-10-CM | POA: Diagnosis not present

## 2016-01-11 MED ORDER — TESTOSTERONE CYPIONATE 200 MG/ML IM SOLN
100.0000 mg | Freq: Once | INTRAMUSCULAR | Status: AC
  Administered 2016-01-11: 100 mg via INTRAMUSCULAR

## 2016-01-11 MED ORDER — OCTREOTIDE ACETATE 30 MG IM KIT
30.0000 mg | PACK | Freq: Once | INTRAMUSCULAR | Status: AC
  Administered 2016-01-11: 30 mg via INTRAMUSCULAR

## 2016-01-11 NOTE — Progress Notes (Signed)
   Subjective:    Patient ID: Mark Gallagher, male    DOB: 29-Mar-1970, 45 y.o.   MRN: [id number]  HPI pt here for Testosterone Cypionate 200 mg/ml, 0.5 mg given in left upper outer quadrant. Sandostatin LAR Depot 30 mg given in the right upper outer quadrant.    Review of Systems     Objective:   Physical Exam        Assessment & Plan:

## 2016-01-28 ENCOUNTER — Ambulatory Visit (INDEPENDENT_AMBULATORY_CARE_PROVIDER_SITE_OTHER): Payer: Managed Care, Other (non HMO) | Admitting: Urgent Care

## 2016-01-28 DIAGNOSIS — E291 Testicular hypofunction: Secondary | ICD-10-CM | POA: Diagnosis not present

## 2016-01-28 MED ORDER — TESTOSTERONE CYPIONATE 200 MG/ML IM SOLN
100.0000 mg | Freq: Once | INTRAMUSCULAR | Status: AC
  Administered 2016-01-28: 100 mg via INTRAMUSCULAR

## 2016-01-28 NOTE — Patient Instructions (Addendum)
Pt is here for Testosterone Cypionate 200/ml 0.5 mg injection only.

## 2016-01-28 NOTE — Progress Notes (Signed)
Patient is here for testosterone injection only.

## 2016-02-01 ENCOUNTER — Encounter: Payer: Self-pay | Admitting: Rehabilitation

## 2016-02-01 ENCOUNTER — Ambulatory Visit: Payer: Managed Care, Other (non HMO) | Attending: Physical Medicine & Rehabilitation | Admitting: Rehabilitation

## 2016-02-01 DIAGNOSIS — M6281 Muscle weakness (generalized): Secondary | ICD-10-CM

## 2016-02-01 DIAGNOSIS — R293 Abnormal posture: Secondary | ICD-10-CM

## 2016-02-01 DIAGNOSIS — R2681 Unsteadiness on feet: Secondary | ICD-10-CM | POA: Diagnosis not present

## 2016-02-01 DIAGNOSIS — R2689 Other abnormalities of gait and mobility: Secondary | ICD-10-CM

## 2016-02-01 NOTE — Therapy (Signed)
[facility] [address] [place], [place], [address] Phone: [phone]   Fax:  [phone]  Physical Therapy Evaluation  Patient Details  Name: Mark Gallagher MRN: [id number] Date of Birth: [birth year] Referring Provider: Alger Simons, MD  Encounter Date: 02/01/2016      PT End of Session - 02/01/16 1252    Visit Number 1   Number of Visits 11   Date for PT Re-Evaluation 04/01/16   Authorization Type [employer], 60 visit limit combined   Authorization - Visit Number 1   Authorization - Number of Visits 60   PT Start Time [phone]   PT Stop Time 1020   PT Time Calculation (min) 49 min   Activity Tolerance Patient tolerated treatment well   Behavior During Therapy [facility] for tasks assessed/performed      Past Medical History:  Diagnosis Date  . 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 Cancer ([place])   . History of pituitary tumor   . Knee pain, bilateral   . Testosterone deficiency   . Thyroid disease     Past Surgical History:  Procedure Laterality Date  . CARDIAC ELECTROPHYSIOLOGY STUDY AND ABLATION    . CERVICAL DISC SURGERY  2007   Anterior C4/5 vertebrectomy and c3-C6 laminoplasty with plating  . CRANIOTOMY FOR TUMOR     pituitary adenoma    There were no vitals filed for this visit.       Subjective Assessment - 02/01/16 0939    Subjective "I'm having some balance issues.  I'm walking between 1/3 and .4 of a mile without a cane and I have someone with me.  Where I walk, is uneven and there are speed bumps that I cross over that I have trouble with. Slopes are issues for me."    Patient is accompained by: Family member   Patient Stated Goals "I want to work on balance, curbs, and slopes."   Currently in Pain? Yes   Pain Score 5    Pain Location Knee   Pain Orientation Left;Right  L>R   Pain Descriptors / Indicators Aching   Pain Type Chronic pain   Pain Onset More than a month ago   Pain Frequency Constant   Aggravating Factors  sitting for a long time and then getting up   Pain Relieving Factors [place], walking            [facility] PT Assessment - 02/01/16 0001      Assessment   Medical Diagnosis balance and gait abnormalitiy   Referring Provider Alger Simons, MD   Prior Therapy previous OP PT/OT     Precautions   Precautions Fall   Precaution Comments no lifting >20lbs  pt reports it is actually 30, but he is limiting to 20lbs     Balance Screen   Has the patient fallen in the past 6 months Yes   How many times? 2   Has the patient had a decrease in activity level because of a fear of falling?  No   Is the patient reluctant to leave their home because of a fear of falling?  No     Home Social worker Private residence   Living Arrangements Parent   Available Help at Discharge Family;Available 24 hours/day   Type of Home House   Home Access Stairs to enter   Entrance Stairs-Number of Steps 6 then 1   Entrance Stairs-Rails Can reach both;Right;Left   Home Layout Two level   Alternate  Level Stairs-Number of Steps 14   Alternate Level Stairs-Rails Right;Left;Can reach both   Home Equipment Cane - quad;Cane - single point;Tub bench;Grab bars - tub/shower;Hand held shower head  comfort height toilet     Prior Function   Level of Independence Needs assistance with homemaking;Independent with household mobility with device;Independent with community mobility with device   Vocation Full time employment   Vocation Requirements working at [employer] long times, walking long distances      Cognition   Overall Cognitive Status Within Functional Limits for tasks assessed     Sensation   Light Touch Appears Intact   Hot/Cold Appears Intact   Proprioception Appears Intact     Coordination   Gross Motor Movements are Fluid and Coordinated No  in LEs   Fine Motor Movements are Fluid and Coordinated No  in LEs   Heel Shin Test decreased range and fluidity due  to strength deficits     Posture/Postural Control   Posture/Postural Control Postural limitations   Postural Limitations Rounded Shoulders;Forward head;Weight shift right;Flexed trunk     ROM / Strength   AROM / PROM / Strength Strength     Strength   Overall Strength Deficits   Overall Strength Comments R hip flex 3+/5, L hip flex 3/5, R knee ext 5/5, L knee ext 5/5, R knee flex 5/5, L knee flex 1/5, L ankle DF 3/5, R ankle DF 5/5     Transfers   Transfers Sit to Stand;Stand to Sit   Sit to Stand 6: Modified independent (Device/Increase time)   Five time sit to stand comments  22.87 w/ BUE support   Stand to Sit 6: Modified independent (Device/Increase time)     Ambulation/Gait   Ambulation/Gait Yes   Ambulation/Gait Assistance 6: Modified independent (Device/Increase time);5: Supervision   Ambulation/Gait Assistance Details Felt that S needed at times when pt making turns, esp during TUG testing   Ambulation Distance (Feet) 115 Feet   Assistive device Straight cane   Gait Pattern Step-through pattern;Decreased arm swing - right;Decreased arm swing - left;Decreased stance time - left;Decreased step length - right;Decreased hip/knee flexion - left;Left genu recurvatum;Lateral hip instability;Lateral trunk lean to right   Ambulation Surface Level;Indoor   Gait velocity 2.53 ft/sec with SPC    Stairs Yes   Stairs Assistance 5: Supervision   Stair Management Technique Two rails;Alternating pattern;Forwards   Number of Stairs 4   Height of Stairs 6     Standardized Balance Assessment   Standardized Balance Assessment Timed Up and Go Test     Timed Up and Go Test   TUG Normal TUG   Normal TUG (seconds) 19.78  with cane                           PT Education - 02/01/16 1251    Education provided Yes   Education Details Education on goals, POC, possible request for OT order   Person(s) Educated Patient;Parent(s)   Methods Explanation   Comprehension  Verbalized understanding          PT Short Term Goals - 02/01/16 1301      PT SHORT TERM GOAL #1   Title Pt will perform initial HEP with mod I using paper handout to indicate safe HEP compliance, maximize functional gains made in PT. Target date for all STGS: 02/29/16   Status New     PT SHORT TERM GOAL #2   Title Will assess BERG  balance test and improve score 4 points from baseline in order to indicate decreased fall risk.       PT SHORT TERM GOAL #3   Title Pt will improve TUG to <16 secs with LRAD in order to indicate decreased fall risk.     Status New     PT SHORT TERM GOAL #4   Title Pt will improve 5TSS to <19 seconds with LRAD in order to indicate improved functional strength.       PT SHORT TERM GOAL #5   Title Pt will ambulate 500' w/ LRAD at mod I level over uneven indoor surfaces (thresholds, mats) to simulate safe negotiation in work environment.     Status New           PT Long Term Goals - 02/01/16 1306      PT LONG TERM GOAL #1   Title Pt will be independent with final HEP in order to indicate improved functional mobility and balance.  (Target Date for all LTGs: 03/28/16)   Status New     PT LONG TERM GOAL #2   Title Pt will increase BERG balance by 8 points from baseline in order to indicate decreased fall risk.     Status New     PT LONG TERM GOAL #3   Title Pt will perform TUG </=13.5 secs w/ LRAD in order to indicate decreased fall risk.     Status New     PT LONG TERM GOAL #4   Title Pt will perform 5TSS in <16 seconds in order to indicate improved functional strength.     Status New     PT LONG TERM GOAL #5   Title Pt will negotiate >1000' over varying outdoor surfaces (including curb, ramp, inclines/declines, grassy surfaces) w/ LRAD at mod I level in order to indicate safe return to community and leisure activities.     Status New     Additional Long Term Goals   Additional Long Term Goals Yes     PT LONG TERM GOAL #6   Title Pt will  perform simulated household activities w/ LRAD at mod I level in order to indicate independence with ADLs at home.     Status New               Plan - 02/01/16 1253    Clinical Impression Statement Pt is a 46 y/o M , familiar to this clinic with history of with myelopathy s/p C3-T2 spinal fusion surgery (09/10/14). Pt hospitalized 8/20 - 10/04/14 (including inpatient rehab from 8/29 - 10/04/14).   Also has history of several cardiac ablations (most recent one in August).  He now returns to PT as he has had an increase in falls due to poor balance, gait deviations, and generalized weakness (L>R).  He notes that he is having increased catching of L toe and that this has also impacted his confidence.  He continues to work full time and will have to work 6 day weeks until mid to end February.  Upon PT evaluation, note that gait speed is 2.52 ft/sec with SPC indicative of limited community ambulator, TUG of 19.78 secs with SPC indicative of fall risk and 5TSS of 22.87 secs with BUE support indicative of decreased functional strength.  Pt is of evolving presentation and moderate complexity from PT POC standpoint and will therefore benefit from skilled OP neuro PT to address said deficits.      Rehab Potential Good   PT Frequency 1x /  week  then 2x/wk for 2 weeks   PT Duration 6 weeks  then 2x/wk for 2 weeks   PT Treatment/Interventions ADLs/Self Care Home Management;Electrical Stimulation;DME Instruction;Gait training;Stair training;Functional mobility training;Therapeutic activities;Therapeutic exercise;Balance training;Neuromuscular re-education;Patient/family education;Orthotic Fit/Training;Vestibular   PT Next Visit Plan BERG, provide HEP for functional strength, knee strengthening, balance, gait over uneven surfaces   Consulted and Agree with Plan of Care Patient;Family member/caregiver   Family Member Consulted pts mother      Patient will benefit from skilled therapeutic intervention in order  to improve the following deficits and impairments:  Abnormal gait, Cardiopulmonary status limiting activity, Decreased activity tolerance, Decreased balance, Decreased endurance, Decreased mobility, Decreased strength, Impaired perceived functional ability, Impaired flexibility, Improper body mechanics, Postural dysfunction  Visit Diagnosis: Unsteadiness on feet - Plan: PT plan of care cert/re-cert  Other abnormalities of gait and mobility - Plan: PT plan of care cert/re-cert  Muscle weakness (generalized) - Plan: PT plan of care cert/re-cert  Abnormal posture - Plan: PT plan of care cert/re-cert     Problem List Patient Active Problem List   Diagnosis Date Noted  . Left knee pain 09/25/2015  . Adjustment disorder with depressed mood   . Tetraplegia ([place]) 09/19/2014  . Acute blood loss anemia 09/19/2014  . Cervical spondylosis with myelopathy 09/18/2014  . Radiculopathy of arm 08/28/2014  . Hypogonadism male 09/08/2013  . 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 Cardiomyopathy 04/01/2011  . Wolff-Parkinson-White (WPW) syndrome 04/01/2011    Cameron Sprang, PT, MPT [facility] [address] [place], [place], [address] Phone: [phone]   Fax:  [phone] 02/01/16, 1:15 PM  Name: Mark Gallagher MRN: [id number] Date of Birth: 12-01-1970

## 2016-02-08 ENCOUNTER — Encounter: Payer: Self-pay | Admitting: Rehabilitation

## 2016-02-08 ENCOUNTER — Ambulatory Visit: Payer: Managed Care, Other (non HMO) | Admitting: Rehabilitation

## 2016-02-08 DIAGNOSIS — M6281 Muscle weakness (generalized): Secondary | ICD-10-CM

## 2016-02-08 DIAGNOSIS — R2689 Other abnormalities of gait and mobility: Secondary | ICD-10-CM

## 2016-02-08 DIAGNOSIS — R2681 Unsteadiness on feet: Secondary | ICD-10-CM | POA: Diagnosis not present

## 2016-02-08 DIAGNOSIS — R293 Abnormal posture: Secondary | ICD-10-CM

## 2016-02-08 NOTE — Patient Instructions (Signed)
Mini Squat    Stand facing counter top with feet slightly wider than shoulders.  Have chair behind you for a target.  Place feet in line with hips. Look straight ahead, back straight.  Squat down slightly.   Go slow and make sure back stays straight.    Copyright  VHI. All rights reserved.   Knee Extension: Short Arc (Eccentric) - Supine or Sitting    Lie on back with roll under knee. Extend knee. Slowly lower foot for 3-5 seconds. _10 reps per set, __2 sets per day, __5-7_ days per week.   http://ecce.exer.us/142   Copyright  VHI. All rights reserved.   Single Leg Raise    Lie on back, one leg bent, other leg straight on mat. Inhale, raising straight leg (only do the right leg) toward ceiling. Keep hips on mat. Exhale, lowering leg to mat.  Keep the left knee bent and only raise right leg to height of left leg.  Lower slowly.   Repeat __10__ times. Repeat with other leg. Do _1-2___ sessions per day.  http://pm.exer.us/12   Copyright  VHI. All rights reserved.   Functional Quadriceps: Sit to Stand    Sit on edge of bed, feet flat on floor. Stand upright, extending knees fully.  Make sure you lean forward and get your weight over your feet.  Start from the bed (higher surface) and work your way down to lower surface as you get better and more confident.   Repeat __10__ times per set. Do ____ sets per session. Do ____ sessions per day.  http://orth.exer.us/734   Copyright  VHI. All rights reserved.

## 2016-02-08 NOTE — Therapy (Signed)
[facility] [address] [place], [place], [address] Phone: [phone]   Fax:  [phone]  Physical Therapy Treatment  Patient Details  Name: Mark Gallagher MRN: [id number] Date of Birth: [birth year] Referring Provider: Alger Simons, MD  Encounter Date: 02/08/2016      PT End of Session - 02/08/16 1134    Visit Number 2   Number of Visits 11   Date for PT Re-Evaluation 04/01/16   Authorization Type [employer], 60 visit limit combined   Authorization - Visit Number 2   Authorization - Number of Visits 60   PT Start Time [id number]   PT Stop Time 1115   PT Time Calculation (min) 60 min   Activity Tolerance Patient tolerated treatment well   Behavior During Therapy [facility] for tasks assessed/performed      Past Medical History:  Diagnosis Date  . 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 Cancer ([place])   . History of pituitary tumor   . Knee pain, bilateral   . Testosterone deficiency   . Thyroid disease     Past Surgical History:  Procedure Laterality Date  . CARDIAC ELECTROPHYSIOLOGY STUDY AND ABLATION    . CERVICAL DISC SURGERY  2007   Anterior C4/5 vertebrectomy and c3-C6 laminoplasty with plating  . CRANIOTOMY FOR TUMOR     pituitary adenoma    There were no vitals filed for this visit.      Subjective Assessment - 02/08/16 1019    Subjective No changes since last visit.  Note that he is using quad cane today due to weather.    Patient is accompained by: Family member   Patient Stated Goals "I want to work on balance, curbs, and slopes."   Currently in Pain? Yes   Pain Score 5    Pain Location Knee   Pain Orientation Right;Left   Pain Type Chronic pain   Pain Onset More than a month ago   Pain Frequency Constant   Aggravating Factors  sitting for a long time and then getting up   Pain Relieving Factors aleve, walking   Multiple Pain Sites Yes   Pain Score 4   Pain Location Back   Pain Orientation Lower   Pain  Descriptors / Indicators Aching   Pain Type Chronic pain   Pain Onset More than a month ago   Pain Frequency Intermittent                         [facility] Adult PT Treatment/Exercise - 02/08/16 1023      Transfers   Transfers Sit to Stand;Stand to Sit   Sit to Stand 4: Min assist   Sit to Stand Details Manual facilitation for weight shifting   Stand to Sit 4: Min assist   Stand to Sit Details (indicate cue type and reason) Manual facilitation for weight shifting   Comments Performed several reps of sit<>stand for BLE strengthening but also for NMR in that he needs education and facilitation for increased forward weight shift (did initially from elevated mat, then actually increased height of mat to increase mechanics of sit<>stand).  Also provided education for sitting on edge of mat (bed at home) and ensure that feet are flat on floor and shoulder width apart.  See pt instruction.      Standardized Balance Assessment   Standardized Balance Assessment Berg Balance Test     Berg Balance Test   Sit to Stand Able to stand using  hands after several tries   Standing Unsupported Able to stand safely 2 minutes   Sitting with Back Unsupported but Feet Supported on Floor or Stool Able to sit safely and securely 2 minutes   Stand to Sit Controls descent by using hands   Transfers Able to transfer with verbal cueing and /or supervision   Standing Unsupported with Eyes Closed Able to stand 10 seconds safely   Standing Ubsupported with Feet Together Able to place feet together independently and stand 1 minute safely   From Standing, Reach Forward with Outstretched Arm Can reach confidently >25 cm (10")   From Standing Position, Pick up Object from Floor Unable to pick up and needs supervision   From Standing Position, Turn to Look Behind Over each Shoulder Needs supervision when turning   Turn 360 Degrees Needs close supervision or verbal cueing   Standing Unsupported, Alternately  Place Feet on Step/Stool Needs assistance to keep from falling or unable to try   Standing Unsupported, One Foot in Front Needs help to step but can hold 15 seconds   Standing on One Leg Unable to try or needs assist to prevent fall   Total Score 31                PT Education - 02/08/16 1134    Education provided Yes   Education Details Education on goals of strengthening around knee to reduce knee pain and allow more mobility, see HEP for details on exercises.     Person(s) Educated Patient;Parent(s)   Methods Explanation;Demonstration;Handout   Comprehension Verbalized understanding;Returned demonstration          PT Short Term Goals - 02/01/16 1301      PT SHORT TERM GOAL #1   Title Pt will perform initial HEP with mod I using paper handout to indicate safe HEP compliance, maximize functional gains made in PT. Target date for all STGS: 02/29/16   Status New     PT SHORT TERM GOAL #2   Title Will assess BERG balance test and improve score 4 points from baseline in order to indicate decreased fall risk.       PT SHORT TERM GOAL #3   Title Pt will improve TUG to <16 secs with LRAD in order to indicate decreased fall risk.     Status New     PT SHORT TERM GOAL #4   Title Pt will improve 5TSS to <19 seconds with LRAD in order to indicate improved functional strength.       PT SHORT TERM GOAL #5   Title Pt will ambulate 500' w/ LRAD at mod I level over uneven indoor surfaces (thresholds, mats) to simulate safe negotiation in work environment.     Status New           PT Long Term Goals - 02/01/16 1306      PT LONG TERM GOAL #1   Title Pt will be independent with final HEP in order to indicate improved functional mobility and balance.  (Target Date for all LTGs: 03/28/16)   Status New     PT LONG TERM GOAL #2   Title Pt will increase BERG balance by 8 points from baseline in order to indicate decreased fall risk.     Status New     PT LONG TERM GOAL #3   Title  Pt will perform TUG </=13.5 secs w/ LRAD in order to indicate decreased fall risk.     Status New  PT LONG TERM GOAL #4   Title Pt will perform 5TSS in <16 seconds in order to indicate improved functional strength.     Status New     PT LONG TERM GOAL #5   Title Pt will negotiate >1000' over varying outdoor surfaces (including curb, ramp, inclines/declines, grassy surfaces) w/ LRAD at mod I level in order to indicate safe return to community and leisure activities.     Status New     Additional Long Term Goals   Additional Long Term Goals Yes     PT LONG TERM GOAL #6   Title Pt will perform simulated household activities w/ LRAD at mod I level in order to indicate independence with ADLs at home.     Status New               Plan - 02/08/16 1135    Clinical Impression Statement Skilled session focused on initiation of HEP for B knee strengthening in order to reduce pain.  Note that he has increased joint pain from heart medication, however also has B knee pain due to arthritis.  Pt tolerated well.  Performed BERG balance test and note score of 31/56 indicative of nearly 100% fall risk.  Educated on meaning of balance results.     Rehab Potential Good   PT Frequency 1x / week  then 2x/wk for 2 weeks   PT Duration 6 weeks  then 2x/wk for 2 weeks   PT Treatment/Interventions ADLs/Self Care Home Management;Electrical Stimulation;DME Instruction;Gait training;Stair training;Functional mobility training;Therapeutic activities;Therapeutic exercise;Balance training;Neuromuscular re-education;Patient/family education;Orthotic Fit/Training;Vestibular   PT Next Visit Plan Assess compliance/knee pain management with HEP, add balance to HEP, gait over uneven surfaces, negotiating obstacles, curbs, start to see if he can bend over with wide BOS to retrieve object from floor.    Consulted and Agree with Plan of Care Patient;Family member/caregiver   Family Member Consulted pts mother       Patient will benefit from skilled therapeutic intervention in order to improve the following deficits and impairments:  Abnormal gait, Cardiopulmonary status limiting activity, Decreased activity tolerance, Decreased balance, Decreased endurance, Decreased mobility, Decreased strength, Impaired perceived functional ability, Impaired flexibility, Improper body mechanics, Postural dysfunction  Visit Diagnosis: Unsteadiness on feet  Other abnormalities of gait and mobility  Muscle weakness (generalized)  Abnormal posture     Problem List Patient Active Problem List   Diagnosis Date Noted  . Left knee pain 09/25/2015  . Adjustment disorder with depressed mood   . Tetraplegia ([place]) 09/19/2014  . Acute blood loss anemia 09/19/2014  . Cervical spondylosis with myelopathy 09/18/2014  . Radiculopathy of arm 08/28/2014  . Hypogonadism male 09/08/2013  . 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 Cardiomyopathy 04/01/2011  . Wolff-Parkinson-White (WPW) syndrome 04/01/2011    Cameron Sprang, PT, MPT [facility] [address] [place], [place], [address] Phone: [phone]   Fax:  [phone] 02/08/16, 11:39 AM  Name: Mark Gallagher MRN: [id number] Date of Birth: 10-Feb-1970

## 2016-02-11 ENCOUNTER — Ambulatory Visit (INDEPENDENT_AMBULATORY_CARE_PROVIDER_SITE_OTHER): Payer: Managed Care, Other (non HMO) | Admitting: Physician Assistant

## 2016-02-11 DIAGNOSIS — E291 Testicular hypofunction: Secondary | ICD-10-CM | POA: Diagnosis not present

## 2016-02-11 DIAGNOSIS — E220 Acromegaly and pituitary gigantism: Secondary | ICD-10-CM

## 2016-02-11 MED ORDER — OCTREOTIDE ACETATE 30 MG IM KIT
30.0000 mg | PACK | Freq: Once | INTRAMUSCULAR | Status: AC
  Administered 2016-02-11: 30 mg via INTRAMUSCULAR

## 2016-02-11 MED ORDER — TESTOSTERONE CYPIONATE 200 MG/ML IM SOLN
100.0000 mg | Freq: Once | INTRAMUSCULAR | Status: AC
  Administered 2016-02-11: 100 mg via INTRAMUSCULAR

## 2016-02-12 NOTE — Progress Notes (Signed)
Subjective:     Patient ID: Mark Gallagher, male   DOB: 1970-08-10, 46 y.o.   MRN: [id number]  HPI patient here for testosterone 0.5 mg and SANDOSTATIN LAR 30 mg only   Review of Systems     Objective:   Physical Exam     Assessment:         Plan:

## 2016-02-15 ENCOUNTER — Ambulatory Visit: Payer: Managed Care, Other (non HMO) | Admitting: Rehabilitation

## 2016-02-15 ENCOUNTER — Ambulatory Visit (INDEPENDENT_AMBULATORY_CARE_PROVIDER_SITE_OTHER): Payer: Managed Care, Other (non HMO) | Admitting: Family Medicine

## 2016-02-15 VITALS — BP 108/66 | HR 68 | Temp 98.0°F | Resp 16 | Ht >= 80 in | Wt 233.0 lb

## 2016-02-15 DIAGNOSIS — K529 Noninfective gastroenteritis and colitis, unspecified: Secondary | ICD-10-CM | POA: Diagnosis not present

## 2016-02-15 NOTE — Progress Notes (Signed)
Chief Complaint  Patient presents with  . Emesis    Pt has been 24 hrs without meds due to emesis per pt's mother  . Diarrhea  . Generalized Body Aches  . Fatigue  . Flu Vaccine    If ok due to symptoms    HPI   Pt has been having diarrhea, emesis, myalgias, fatigue He took peptobismol for his symptoms He reports that he has not been able to keep food down yesterday but has been able to keep bread, some grits and jello down today He has already started feeling better but he kept his appointment today He has less diarrhea He feels less dizzy compared to yesterday [place]   Past Medical History:  Diagnosis Date  . 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 Cancer ([place])   . History of pituitary tumor   . Knee pain, bilateral   . Testosterone deficiency   . Thyroid disease     Current Outpatient Prescriptions  Medication Sig Dispense Refill  . bisoprolol (ZEBETA) 5 MG tablet Take 5 mg by mouth daily.    . Cholecalciferol (VITAMIN D3) 2000 UNITS capsule Take by mouth.    . ferrous sulfate 325 (65 FE) MG tablet Take 1 tablet (325 mg total) by mouth 2 (two) times daily with a meal. 60 tablet 1  . hydrocortisone (CORTEF) 10 MG tablet 15 mg in the morning and 5 mg in the afternoon. Needs 30 extra tabs for sick days    . levothyroxine (SYNTHROID, LEVOTHROID) 200 MCG tablet Take 1 tablet (200 mcg total) by mouth daily.    . Menthol-Methyl Salicylate (MUSCLE RUB) 10-15 % CREA Apply 1 application topically as needed for muscle pain.  0  . octreotide (SANDOSTATIN LAR) 30 MG injection Inject 1 syringe (30 mg) into the muscle monthy    . testosterone cypionate (DEPOTESTOSTERONE CYPIONATE) 200 MG/ML injection Inject 1 mL (200 mg total) into the muscle every 14 (fourteen) days. 10 mL 0  . amiodarone (PACERONE) 400 MG tablet Take by mouth.     Current Facility-Administered Medications  Medication Dose Route Frequency Provider Last Rate Last Dose  . octreotide (SANDOSTATIN LAR) IM  injection 30 mg  30 mg Intramuscular Q28 days Wendie Agreste, MD   30 mg at 12/18/15 1150    Allergies:  Allergies  Allergen Reactions  . Oxycodone     Nausea?    Past Surgical History:  Procedure Laterality Date  . CARDIAC ELECTROPHYSIOLOGY STUDY AND ABLATION    . CERVICAL DISC SURGERY  2007   Anterior C4/5 vertebrectomy and c3-C6 laminoplasty with plating  . CRANIOTOMY FOR TUMOR     pituitary adenoma    Social History   Social History  . Marital status: Single    Spouse name: N/A  . Number of children: N/A  . Years of education: N/A   Social History Main Topics  . Smoking status: Never Smoker  . Smokeless tobacco: Never Used  . Alcohol use No  . Drug use: No  . Sexual activity: Not Asked   Other Topics Concern  . None   Social History Narrative  . None    ROS  Objective: Vitals:   02/15/16 1127  BP: 108/66  Pulse: 68  Resp: 16  Temp: 98 F (36.7 C)  TempSrc: Oral  SpO2: 97%  Weight: 233 lb (105.7 kg)  Height: 6\' 8"  (2.032 m)    Physical Exam General: alert, oriented, in NAD Head:  atraumatic, no sinus tenderness  Eyes: EOM intact, no scleral icterus or conjunctival injection Ears: TM clear bilaterally Throat: no pharyngeal exudate or erythema Lymph: no posterior auricular, submental or cervical lymph adenopathy Heart: normal rate, normal sinus rhythm, no murmurs Lungs: clear to auscultation bilaterally, no wheezing   Assessment and Plan Blair was seen today for emesis, diarrhea, generalized body aches, fatigue and flu vaccine.  Diagnoses and all orders for this visit:  Gastroenteritis, acute Advised supportive care  Pt is already showing signs of improvement based on history  Benign physical exam Return for flu vaccine once this episode resolves Other orders -     Cancel: Flu Vaccine QUAD 36+ mos IM     Tasheema Perrone A Bradd Merlos

## 2016-02-15 NOTE — Patient Instructions (Addendum)
IF you received an x-ray today, you will receive an invoice from [facility]. Please contact [facility] at [phone] with questions or concerns regarding your invoice.   IF you received labwork today, you will receive an invoice from [facility]. Please contact [facility] at [phone] with questions or concerns regarding your invoice.   Our billing staff will not be able to assist you with questions regarding bills from these companies.  You will be contacted with the lab results as soon as they are available. The fastest way to get your results is to activate your My Chart account. Instructions are located on the last page of this paperwork. If you have not heard from [place] regarding the results in 2 weeks, please contact this office.     Food Choices to Help Relieve Diarrhea, Adult When you have diarrhea, the foods you eat and your eating habits are very important. Choosing the right foods and drinks can help relieve diarrhea. Also, because diarrhea can last up to 7 days, you need to replace lost fluids and electrolytes (such as sodium, potassium, and chloride) in order to help prevent dehydration. What general guidelines do I need to follow?  Slowly drink 1 cup (8 oz) of fluid for each episode of diarrhea. If you are getting enough fluid, your urine will be clear or pale yellow.  Eat starchy foods. Some good choices include white rice, white toast, pasta, low-fiber cereal, baked potatoes (without the skin), saltine crackers, and bagels.  Avoid large servings of any cooked vegetables.  Limit fruit to two servings per day. A serving is  cup or 1 small piece.  Choose foods with less than 2 g of fiber per serving.  Limit fats to less than 8 tsp (38 g) per day.  Avoid fried foods.  Eat foods that have probiotics in them. Probiotics can be found in certain dairy products.  Avoid foods and beverages that may increase the speed at which food moves through the  stomach and intestines (gastrointestinal tract). Things to avoid include:  High-fiber foods, such as dried fruit, raw fruits and vegetables, nuts, seeds, and whole grain foods.  Spicy foods and high-fat foods.  Foods and beverages sweetened with high-fructose corn syrup, honey, or sugar alcohols such as xylitol, sorbitol, and mannitol. What foods are recommended? Grains  White rice. White, [place], or pita breads (fresh or toasted), including plain rolls, buns, or bagels. White pasta. Saltine, soda, or graham crackers. Pretzels. Low-fiber cereal. Cooked cereals made with water (such as cornmeal, farina, or cream cereals). Plain muffins. Matzo. Melba toast. Zwieback. Vegetables  Potatoes (without the skin). Strained tomato and vegetable juices. Most well-cooked and canned vegetables without seeds. Tender lettuce. Fruits  Cooked or canned applesauce, apricots, cherries, fruit cocktail, grapefruit, peaches, pears, or plums. Fresh bananas, apples without skin, cherries, grapes, cantaloupe, grapefruit, peaches, oranges, or plums. Meat and Other Protein Products  Baked or boiled chicken. Eggs. Tofu. Fish. Seafood. Smooth peanut butter. Ground or well-cooked tender beef, ham, veal, lamb, pork, or poultry. Dairy  Plain yogurt, kefir, and unsweetened liquid yogurt. Lactose-free milk, buttermilk, or soy milk. Plain hard cheese. Beverages  Sport drinks. Clear broths. Diluted fruit juices (except prune). Regular, caffeine-free sodas such as ginger ale. Water. Decaffeinated teas. Oral rehydration solutions. Sugar-free beverages not sweetened with sugar alcohols. Other  Bouillon, broth, or soups made from recommended foods. The items listed above may not be a complete list of recommended foods or beverages. Contact your dietitian for more options.  What foods are not recommended? Grains  Whole grain, whole wheat, bran, or rye breads, rolls, pastas, crackers, and cereals. Wild or brown rice. Cereals that  contain more than 2 g of fiber per serving. Corn tortillas or taco shells. Cooked or dry oatmeal. Granola. Popcorn. Vegetables  Raw vegetables. Cabbage, broccoli, Brussels sprouts, artichokes, baked beans, beet greens, corn, kale, legumes, peas, sweet potatoes, and yams. Potato skins. Cooked spinach and cabbage. Fruits  Dried fruit, including raisins and dates. Raw fruits. Stewed or dried prunes. Fresh apples with skin, apricots, mangoes, pears, raspberries, and strawberries. Meat and Other Protein Products  Chunky peanut butter. Nuts and seeds. Beans and lentils. Berniece Salines. Dairy  High-fat cheeses. Milk, chocolate milk, and beverages made with milk, such as milk shakes. Cream. Ice cream. Sweets and Desserts  Sweet rolls, doughnuts, and sweet breads. Pancakes and waffles. Fats and Oils  Butter. Cream sauces. Margarine. Salad oils. Plain salad dressings. Olives. Avocados. Beverages  Caffeinated beverages (such as coffee, tea, soda, or energy drinks). Alcoholic beverages. Fruit juices with pulp. Prune juice. Soft drinks sweetened with high-fructose corn syrup or sugar alcohols. Other  Coconut. Hot sauce. Chili powder. Mayonnaise. Gravy. Cream-based or milk-based soups. The items listed above may not be a complete list of foods and beverages to avoid. Contact your dietitian for more information.  What should I do if I become dehydrated? Diarrhea can sometimes lead to dehydration. Signs of dehydration include dark urine and dry mouth and skin. If you think you are dehydrated, you should rehydrate with an oral rehydration solution. These solutions can be purchased at pharmacies, retail stores, or online. Drink -1 cup (120-240 mL) of oral rehydration solution each time you have an episode of diarrhea. If drinking this amount makes your diarrhea worse, try drinking smaller amounts more often. For example, drink 1-3 tsp (5-15 mL) every 5-10 minutes. A general rule for staying hydrated is to drink 1-2 L of  fluid per day. Talk to your health care provider about the specific amount you should be drinking each day. Drink enough fluids to keep your urine clear or pale yellow. This information is not intended to replace advice given to you by your health care provider. Make sure you discuss any questions you have with your health care provider. Document Released: 03/29/2003 Document Revised: 06/14/2015 Document Reviewed: 11/29/2012 Elsevier Interactive Patient Education  2017 [employer].

## 2016-02-16 NOTE — Progress Notes (Signed)
Testosterone never picked up destroyed by me

## 2016-02-22 ENCOUNTER — Encounter: Payer: Self-pay | Admitting: Rehabilitation

## 2016-02-22 ENCOUNTER — Ambulatory Visit: Payer: Managed Care, Other (non HMO) | Attending: Physical Medicine & Rehabilitation | Admitting: Rehabilitation

## 2016-02-22 DIAGNOSIS — M6281 Muscle weakness (generalized): Secondary | ICD-10-CM

## 2016-02-22 DIAGNOSIS — R2681 Unsteadiness on feet: Secondary | ICD-10-CM | POA: Diagnosis not present

## 2016-02-22 DIAGNOSIS — R293 Abnormal posture: Secondary | ICD-10-CM | POA: Diagnosis present

## 2016-02-22 DIAGNOSIS — R269 Unspecified abnormalities of gait and mobility: Secondary | ICD-10-CM | POA: Insufficient documentation

## 2016-02-22 DIAGNOSIS — R2689 Other abnormalities of gait and mobility: Secondary | ICD-10-CM | POA: Insufficient documentation

## 2016-02-22 NOTE — Patient Instructions (Signed)
Standing Marching   Walking along the countertop, march as high as you can and as slow as you can.  Remember if you are marching your right leg, stand tall on the left leg (make knee straight and tighten hip).   Repeat 3 times down and back. Do 1 sessions per day.        http://gt2.exer.us/342   Copyright  VHI. All rights reserved.      Walking along the counter top, walk heel to toe with hand on the counter top for support, Repeat x 3 reps down and back (go forwards and backwards). Repeat 1  times per session. Do 1 sessions per day.  Copyright  VHI. All rights reserved.    Feet Apart (Compliant Surface) Head Motion - Eyes Open    Stand in corner with a chair in front of you for support.  With eyes open, standing on compliant surface: ___pillow or cushion_____, feet shoulder width apart, move head slowly: up and down x 10 reps, and side to side x 10 reps Repeat __1__ times per session. Do __1__ sessions per day.  Copyright  VHI. All rights reserved.   Feet Together (Compliant Surface) Arm Motion - Eyes Closed    Stand on compliant surface: ___pillow_____ with feet somewhat together. Close eyes and keep arms by your side.  Repeat __3__ times per session for 30 secs each. Do __1__ sessions per day.  Copyright  VHI. All rights reserved.

## 2016-02-22 NOTE — Therapy (Signed)
[facility] [address] [place], [place], [address] Phone: [phone]   Fax:  [phone]  Physical Therapy Treatment  Patient Details  Name: Mark Gallagher MRN: [id number] Date of Birth: [birth year] Referring Provider: Alger Simons, MD  Encounter Date: 02/22/2016      PT End of Session - 02/22/16 1030    Visit Number 3   Number of Visits 11   Date for PT Re-Evaluation 04/01/16   Authorization Type [employer], 60 visit limit combined   Authorization - Visit Number 3   Authorization - Number of Visits 60   PT Start Time 1018   PT Stop Time 1100   PT Time Calculation (min) 42 min   Activity Tolerance Patient tolerated treatment well   Behavior During Therapy [facility] for tasks assessed/performed      Past Medical History:  Diagnosis Date  . 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 Cancer ([place])   . History of pituitary tumor   . Knee pain, bilateral   . Testosterone deficiency   . Thyroid disease     Past Surgical History:  Procedure Laterality Date  . CARDIAC ELECTROPHYSIOLOGY STUDY AND ABLATION    . CERVICAL DISC SURGERY  2007   Anterior C4/5 vertebrectomy and c3-C6 laminoplasty with plating  . CRANIOTOMY FOR TUMOR     pituitary adenoma    There were no vitals filed for this visit.      Subjective Assessment - 02/22/16 1026    Subjective Reports having stomach bug but is better now.  Also note that he stopped one medication that was causing increased joint pain.     Patient Stated Goals "I want to work on balance, curbs, and slopes."   Currently in Pain? Yes   Pain Score 3    Pain Location Knee   Pain Orientation Right;Left   Pain Descriptors / Indicators Aching   Pain Type Chronic pain   Pain Onset More than a month ago   Pain Frequency Constant   Aggravating Factors  sitting for a long time and then getting up   Pain Relieving Factors aleve, walking   Multiple Pain Sites Yes   Pain Score 3   Pain Location Back   Pain Orientation Lower   Pain Descriptors / Indicators Aching   Pain Type Chronic pain   Pain Onset More than a month ago   Pain Frequency Intermittent                         [facility] Adult PT Treatment/Exercise - 02/22/16 0001      Transfers   Transfers Sit to Stand;Stand to Sit   Sit to Stand 5: Supervision   Sit to Stand Details Verbal cues for sequencing;Verbal cues for technique   Sit to Stand Details (indicate cue type and reason) Continue to work on sit<>stand for improved technique and forward weight shift.  Performed today x 10 reps from 24.5" height mat.  Tolerated well and gave education to continue to try at home from varying surfaces.     Stand to Sit 5: Supervision   Stand to Sit Details (indicate cue type and reason) Verbal cues for sequencing;Verbal cues for technique                PT Education - 02/22/16 1353    Education provided Yes   Education Details Education on how to modify HEP at home with height of seating surface, initiation of HEP for balance  Person(s) Educated Patient   Methods Explanation;Demonstration;Handout   Comprehension Verbalized understanding;Returned demonstration          PT Short Term Goals - 02/01/16 1301      PT SHORT TERM GOAL #1   Title Pt will perform initial HEP with mod I using paper handout to indicate safe HEP compliance, maximize functional gains made in PT. Target date for all STGS: 02/29/16   Status New     PT SHORT TERM GOAL #2   Title Will assess BERG balance test and improve score 4 points from baseline in order to indicate decreased fall risk.       PT SHORT TERM GOAL #3   Title Pt will improve TUG to <16 secs with LRAD in order to indicate decreased fall risk.     Status New     PT SHORT TERM GOAL #4   Title Pt will improve 5TSS to <19 seconds with LRAD in order to indicate improved functional strength.       PT SHORT TERM GOAL #5   Title Pt will ambulate 500' w/ LRAD at mod I level over  uneven indoor surfaces (thresholds, mats) to simulate safe negotiation in work environment.     Status New           PT Long Term Goals - 02/01/16 1306      PT LONG TERM GOAL #1   Title Pt will be independent with final HEP in order to indicate improved functional mobility and balance.  (Target Date for all LTGs: 03/28/16)   Status New     PT LONG TERM GOAL #2   Title Pt will increase BERG balance by 8 points from baseline in order to indicate decreased fall risk.     Status New     PT LONG TERM GOAL #3   Title Pt will perform TUG </=13.5 secs w/ LRAD in order to indicate decreased fall risk.     Status New     PT LONG TERM GOAL #4   Title Pt will perform 5TSS in <16 seconds in order to indicate improved functional strength.     Status New     PT LONG TERM GOAL #5   Title Pt will negotiate >1000' over varying outdoor surfaces (including curb, ramp, inclines/declines, grassy surfaces) w/ LRAD at mod I level in order to indicate safe return to community and leisure activities.     Status New     Additional Long Term Goals   Additional Long Term Goals Yes     PT LONG TERM GOAL #6   Title Pt will perform simulated household activities w/ LRAD at mod I level in order to indicate independence with ADLs at home.     Status New               Plan - 02/22/16 1353    Clinical Impression Statement Skilled session focused on education/discussion of sit<>stand at home, how to alternate HEP schedule to promote compliance and added balance to HEP, see pt instruction for details.    Rehab Potential Good   PT Frequency 1x / week  then 2x/wk for 2 weeks   PT Duration 6 weeks  then 2x/wk for 2 weeks   PT Treatment/Interventions ADLs/Self Care Home Management;Electrical Stimulation;DME Instruction;Gait training;Stair training;Functional mobility training;Therapeutic activities;Therapeutic exercise;Balance training;Neuromuscular re-education;Patient/family education;Orthotic  Fit/Training;Vestibular   PT Next Visit Plan  gait over uneven surfaces, negotiating obstacles, curbs, start to see if he can bend over with wide  BOS to retrieve object from floor.    Consulted and Agree with Plan of Care Patient;Family member/caregiver   Family Member Consulted pts mother      Patient will benefit from skilled therapeutic intervention in order to improve the following deficits and impairments:  Abnormal gait, Cardiopulmonary status limiting activity, Decreased activity tolerance, Decreased balance, Decreased endurance, Decreased mobility, Decreased strength, Impaired perceived functional ability, Impaired flexibility, Improper body mechanics, Postural dysfunction  Visit Diagnosis: Unsteadiness on feet  Other abnormalities of gait and mobility  Muscle weakness (generalized)  Abnormal posture     Problem List Patient Active Problem List   Diagnosis Date Noted  . Left knee pain 09/25/2015  . Adjustment disorder with depressed mood   . Tetraplegia ([place]) 09/19/2014  . Acute blood loss anemia 09/19/2014  . Cervical spondylosis with myelopathy 09/18/2014  . Radiculopathy of arm 08/28/2014  . Hypogonadism male 09/08/2013  . 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 Cardiomyopathy 04/01/2011  . Wolff-Parkinson-White (WPW) syndrome 04/01/2011    Cameron Sprang, PT, MPT [facility] [address] [place], [place], [address] Phone: [phone]   Fax:  [phone] 02/22/16, 1:56 PM  Name: Mark Gallagher MRN: [id number] Date of Birth: May 09, 1970

## 2016-02-29 ENCOUNTER — Ambulatory Visit: Payer: Managed Care, Other (non HMO)

## 2016-02-29 DIAGNOSIS — R2689 Other abnormalities of gait and mobility: Secondary | ICD-10-CM

## 2016-02-29 DIAGNOSIS — R293 Abnormal posture: Secondary | ICD-10-CM

## 2016-02-29 DIAGNOSIS — R2681 Unsteadiness on feet: Secondary | ICD-10-CM | POA: Diagnosis not present

## 2016-02-29 DIAGNOSIS — M6281 Muscle weakness (generalized): Secondary | ICD-10-CM

## 2016-02-29 NOTE — Therapy (Signed)
[facility] [address] [place], [place], [address] Phone: [phone]   Fax:  [phone]  Physical Therapy Treatment  Patient Details  Name: Mark Gallagher MRN: [id number] Date of Birth: [birth year] Referring Provider: Alger Simons, MD  Encounter Date: 02/29/2016      PT End of Session - 02/29/16 1225    Visit Number 4   Number of Visits 11   Date for PT Re-Evaluation 04/01/16   Authorization Type [employer]et[employer], 60 visit limit combined   Authorization - Visit Number 4   Authorization - Number of Visits 60   PT Start Time 1017   PT Stop Time 1100   PT Time Calculation (min) 43 min   Equipment Utilized During Treatment Gait belt   Activity Tolerance Patient tolerated treatment well   Behavior During Therapy WFL for tasks assessed/performed      Past Medical History:  Diagnosis Date  . 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 Cancer ([place])   . History of pituitary tumor   . Knee pain, bilateral   . Testosterone deficiency   . Thyroid disease     Past Surgical History:  Procedure Laterality Date  . CARDIAC ELECTROPHYSIOLOGY STUDY AND ABLATION    . CERVICAL DISC SURGERY  2007   Anterior C4/5 vertebrectomy and c3-C6 laminoplasty with plating  . CRANIOTOMY FOR TUMOR     pituitary adenoma    There were no vitals filed for this visit.      Subjective Assessment - 02/29/16 1032    Subjective Pt reported incr. back pain after working and performing HEP, as he's been working 6 days a week. Pt reports he stands for 6-6.5 hours while at work, lifts no more than 20 pounds, performs many floor to standing transfers. Pt reports back back has been better today and yesterday.   Patient is accompained by: Family member   Patient Stated Goals "I want to work on balance, curbs, and slopes."   Currently in Pain? No/denies                         [facility] Adult PT Treatment/Exercise - 02/29/16 1033      Standardized Balance  Assessment   Standardized Balance Assessment Berg Balance Test     Berg Balance Test   Sit to Stand Able to stand without using hands and stabilize independently   Standing Unsupported Able to stand safely 2 minutes   Sitting with Back Unsupported but Feet Supported on Floor or Stool Able to sit safely and securely 2 minutes   Stand to Sit Sits safely with minimal use of hands   Transfers Able to transfer safely, definite need of hands   Standing Unsupported with Eyes Closed Able to stand 10 seconds safely   Standing Ubsupported with Feet Together Able to place feet together independently and stand for 1 minute with supervision   From Standing, Reach Forward with Outstretched Arm Can reach forward >5 cm safely (2")  4"   From Standing Position, Pick up Object from Floor Able to pick up shoe, needs supervision   From Standing Position, Turn to Look Behind Over each Shoulder Looks behind one side only/other side shows less weight shift   Turn 360 Degrees Needs close supervision or verbal cueing   Standing Unsupported, Alternately Place Feet on Step/Stool Needs assistance to keep from falling or unable to try   Standing Unsupported, One Foot in Front Able to take small step independently and  hold 30 seconds   Standing on One Leg Tries to lift leg/unable to hold 3 seconds but remains standing independently   Total Score 38           Self Care:     PT Education - 02/29/16 1223    Education provided Yes   Education Details PT educated pt on how to reduce LBP. PT educated pt on the proper way to lift with legs vs. lower back, take incr. seated rest breaks if LBP incr. >3/10, stand at counter with UE support and place one foot on a block/raised surface, write down work activities and HEP activities which incr. LBP and cease activities in HEP which incr. LBP and PT will modify next visit, bring work shoes to next PT session for PT to assess. PT discussed outcome measures and goal progress.     Person(s) Educated Patient;Parent(s)   Methods Explanation;Demonstration;Verbal cues   Comprehension Verbalized understanding          PT Short Term Goals - 02/29/16 1227      PT SHORT TERM GOAL #1   Title Pt will perform initial HEP with mod I using paper handout to indicate safe HEP compliance, maximize functional gains made in PT. Target date for all STGS: 02/29/16   Status New     PT SHORT TERM GOAL #2   Title Will assess BERG balance test and improve score 4 points from baseline in order to indicate decreased fall risk.     Baseline 38/56 on 02/29/16   Status Achieved     PT SHORT TERM GOAL #3   Title Pt will improve TUG to <16 secs with LRAD in order to indicate decreased fall risk.     Status New     PT SHORT TERM GOAL #4   Title Pt will improve 5TSS to <19 seconds with LRAD in order to indicate improved functional strength.       PT SHORT TERM GOAL #5   Title Pt will ambulate 500' w/ LRAD at mod I level over uneven indoor surfaces (thresholds, mats) to simulate safe negotiation in work environment.     Status New           PT Long Term Goals - 02/01/16 1306      PT LONG TERM GOAL #1   Title Pt will be independent with final HEP in order to indicate improved functional mobility and balance.  (Target Date for all LTGs: 03/28/16)   Status New     PT LONG TERM GOAL #2   Title Pt will increase BERG balance by 8 points from baseline in order to indicate decreased fall risk.     Status New     PT LONG TERM GOAL #3   Title Pt will perform TUG </=13.5 secs w/ LRAD in order to indicate decreased fall risk.     Status New     PT LONG TERM GOAL #4   Title Pt will perform 5TSS in <16 seconds in order to indicate improved functional strength.     Status New     PT LONG TERM GOAL #5   Title Pt will negotiate >1000' over varying outdoor surfaces (including curb, ramp, inclines/declines, grassy surfaces) w/ LRAD at mod I level in order to indicate safe return to community  and leisure activities.     Status New     Additional Long Term Goals   Additional Long Term Goals Yes     PT LONG TERM GOAL #  6   Title Pt will perform simulated household activities w/ LRAD at mod I level in order to indicate independence with ADLs at home.     Status New               Plan - 02/29/16 1225    Clinical Impression Statement Pt demonstrated progress, as he met STG 2. Skilled session focused on education to reduce LBP, as this is hindering pt's ability to perform HEP. Pt required frequent seated rest breaks during BERG test 2/2 fatigue. Continue with POC.    Rehab Potential Good   PT Frequency 1x / week  then 2x/wk for 2 weeks   PT Duration 6 weeks  then 2x/wk for 2 weeks   PT Treatment/Interventions ADLs/Self Care Home Management;Electrical Stimulation;DME Instruction;Gait training;Stair training;Functional mobility training;Therapeutic activities;Therapeutic exercise;Balance training;Neuromuscular re-education;Patient/family education;Orthotic Fit/Training;Vestibular   PT Next Visit Plan Finish assessing STGs.   Consulted and Agree with Plan of Care Patient;Family member/caregiver   Family Member Consulted pts mother      Patient will benefit from skilled therapeutic intervention in order to improve the following deficits and impairments:  Abnormal gait, Cardiopulmonary status limiting activity, Decreased activity tolerance, Decreased balance, Decreased endurance, Decreased mobility, Decreased strength, Impaired perceived functional ability, Impaired flexibility, Improper body mechanics, Postural dysfunction  Visit Diagnosis: Other abnormalities of gait and mobility  Muscle weakness (generalized)  Abnormal posture     Problem List Patient Active Problem List   Diagnosis Date Noted  . Left knee pain 09/25/2015  . Adjustment disorder with depressed mood   . Tetraplegia ([place]) 09/19/2014  . Acute blood loss anemia 09/19/2014  . Cervical spondylosis with  myelopathy 09/18/2014  . Radiculopathy of arm 08/28/2014  . Hypogonadism male 09/08/2013  . 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 Cardiomyopathy 04/01/2011  . Wolff-Parkinson-White (WPW) syndrome 04/01/2011    Ardice Boyan L 02/29/2016, 12:28 PM  [facility] [address] [place], [place], [address] Phone: [phone]   Fax:  [phone]  Name: Mark Gallagher MRN: [id number] Date of Birth: 1970-11-16  Geoffry Paradise, PT,DPT 02/29/16 12:28 PM Phone: [phone] Fax: [phone]

## 2016-03-06 ENCOUNTER — Ambulatory Visit (INDEPENDENT_AMBULATORY_CARE_PROVIDER_SITE_OTHER): Payer: Managed Care, Other (non HMO) | Admitting: Physician Assistant

## 2016-03-06 DIAGNOSIS — E291 Testicular hypofunction: Secondary | ICD-10-CM | POA: Diagnosis not present

## 2016-03-06 MED ORDER — TESTOSTERONE CYPIONATE 100 MG/ML IM SOLN
100.0000 mg | Freq: Once | INTRAMUSCULAR | Status: AC
  Administered 2016-03-06: 100 mg via INTRAMUSCULAR

## 2016-03-07 ENCOUNTER — Ambulatory Visit: Payer: Managed Care, Other (non HMO) | Admitting: Rehabilitation

## 2016-03-07 DIAGNOSIS — R2689 Other abnormalities of gait and mobility: Secondary | ICD-10-CM

## 2016-03-07 DIAGNOSIS — R2681 Unsteadiness on feet: Secondary | ICD-10-CM

## 2016-03-07 DIAGNOSIS — R293 Abnormal posture: Secondary | ICD-10-CM

## 2016-03-07 DIAGNOSIS — M6281 Muscle weakness (generalized): Secondary | ICD-10-CM

## 2016-03-07 NOTE — Therapy (Signed)
[facility] [address] [place], [place], [address] Phone: [phone]   Fax:  [phone]  Physical Therapy Treatment  Patient Details  Name: Mark Gallagher MRN: [id number] Date of Birth: 1970/07/27 Referring Provider: Alger Simons, MD  Encounter Date: 03/07/2016      PT End of Session - 03/07/16 1458    Visit Number 5   Number of Visits 11   Date for PT Re-Evaluation 04/01/16   Authorization Type [employer], 60 visit limit combined   Authorization - Visit Number 5   Authorization - Number of Visits 60   PT Start Time 1017   PT Stop Time 1100   PT Time Calculation (min) 43 min   Equipment Utilized During Treatment Gait belt   Activity Tolerance Patient tolerated treatment well   Behavior During Therapy WFL for tasks assessed/performed      Past Medical History:  Diagnosis Date  . 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 Cancer ([place])   . History of pituitary tumor   . Knee pain, bilateral   . Testosterone deficiency   . Thyroid disease     Past Surgical History:  Procedure Laterality Date  . CARDIAC ELECTROPHYSIOLOGY STUDY AND ABLATION    . CERVICAL DISC SURGERY  2007   Anterior C4/5 vertebrectomy and c3-C6 laminoplasty with plating  . CRANIOTOMY FOR TUMOR     pituitary adenoma    There were no vitals filed for this visit.      Subjective Assessment - 03/07/16 1411    Subjective Reports [place].  Bruises to R middle finger and R buttocks/hip.     Patient is accompained by: Family member   Patient Stated Goals "I want to work on balance, curbs, and slopes."   Currently in Pain? No/denies                         [facility] Adult PT Treatment/Exercise - 03/07/16 0001      Self-Care   Self-Care Other Self-Care Comments   Other Self-Care Comments  Pt with concerns regarding his work schedule and coming to therapy and is this beneficial.  Discussed that as long as pt feels that  he is able to carryover HEP and continue to work with PT to resolve work area difficulties that therapy could continue.   Also educated that if he felt PT was too much right now since working 6 days/wk that he could always take a break and come back when he is 5 days/wk.  Pt and mother verbalized understanding.       Therapeutic Activites    Therapeutic Activities Work Medical illustrator Performed several situations with mat surfaces and tighter areas in order to simulate work area.  Ambulated in zig zag pattern on and off of two therpay mats placed on floor.  Note that pt tends to vary which leg he leads with therefore recommended he always lead with LLE due to decreased sensation to ensure proper footing prior to next step.  Pt return demo well during session. Then performed forward walking straddling mat (LLE on mat and RLE off of mat) x 2 reps of 12' with SPC.  Tolerated well during session.  Progressed to stepping onto mat while walking through tight spaces to simulate work set up.  Note that at times area is too tight to negotiate forwards, therefore encouraged him to side step laterally for improved safety and continued  wider BOS.  Note that this was somewhat difficult therefore had him perform along counter top and also provided for HEP to improve technique.  Pt verbalized understanding.       Exercises   Exercises Other Exercises   Other Exercises  SL L hip abd x 2 reps, however this was too difficult and caused increased pain, therefore had pt perform SL clam abduction x 10 reps as well as added to HEP.                  PT Education - 03/07/16 1458    Education Details see self care, also additions to HEP   Person(s) Educated Patient   Methods Explanation;Demonstration;Handout   Comprehension Verbalized understanding;Returned demonstration          PT Short Term Goals - 02/29/16 1227      PT SHORT TERM GOAL #1   Title Pt will perform initial HEP with mod I using  paper handout to indicate safe HEP compliance, maximize functional gains made in PT. Target date for all STGS: 02/29/16   Status New     PT SHORT TERM GOAL #2   Title Will assess BERG balance test and improve score 4 points from baseline in order to indicate decreased fall risk.     Baseline 38/56 on 02/29/16   Status Achieved     PT SHORT TERM GOAL #3   Title Pt will improve TUG to <16 secs with LRAD in order to indicate decreased fall risk.     Status New     PT SHORT TERM GOAL #4   Title Pt will improve 5TSS to <19 seconds with LRAD in order to indicate improved functional strength.       PT SHORT TERM GOAL #5   Title Pt will ambulate 500' w/ LRAD at mod I level over uneven indoor surfaces (thresholds, mats) to simulate safe negotiation in work environment.     Status New           PT Long Term Goals - 02/01/16 1306      PT LONG TERM GOAL #1   Title Pt will be independent with final HEP in order to indicate improved functional mobility and balance.  (Target Date for all LTGs: 03/28/16)   Status New     PT LONG TERM GOAL #2   Title Pt will increase BERG balance by 8 points from baseline in order to indicate decreased fall risk.     Status New     PT LONG TERM GOAL #3   Title Pt will perform TUG </=13.5 secs w/ LRAD in order to indicate decreased fall risk.     Status New     PT LONG TERM GOAL #4   Title Pt will perform 5TSS in <16 seconds in order to indicate improved functional strength.     Status New     PT LONG TERM GOAL #5   Title Pt will negotiate >1000' over varying outdoor surfaces (including curb, ramp, inclines/declines, grassy surfaces) w/ LRAD at mod I level in order to indicate safe return to community and leisure activities.     Status New     Additional Long Term Goals   Additional Long Term Goals Yes     PT LONG TERM GOAL #6   Title Pt will perform simulated household activities w/ LRAD at mod I level in order to indicate independence with ADLs at home.      Status New  Plan - 03/07/16 1459    Clinical Impression Statement Skilled session focused on problem solving work area as this is most difficult to negotiate and pt had bad fall last week.     Rehab Potential Good   PT Frequency 1x / week  then 2x/wk for 2 weeks   PT Duration 6 weeks  then 2x/wk for 2 weeks   PT Treatment/Interventions ADLs/Self Care Home Management;Electrical Stimulation;DME Instruction;Gait training;Stair training;Functional mobility training;Therapeutic activities;Therapeutic exercise;Balance training;Neuromuscular re-education;Patient/family education;Orthotic Fit/Training;Vestibular   PT Next Visit Plan STGs, balance, work simulated tasks, negotiating over obstacles/uneven surfaces   Consulted and Agree with Plan of Care Patient;Family member/caregiver   Family Member Consulted pts mother      Patient will benefit from skilled therapeutic intervention in order to improve the following deficits and impairments:  Abnormal gait, Cardiopulmonary status limiting activity, Decreased activity tolerance, Decreased balance, Decreased endurance, Decreased mobility, Decreased strength, Impaired perceived functional ability, Impaired flexibility, Improper body mechanics, Postural dysfunction  Visit Diagnosis: Abnormal posture  Other abnormalities of gait and mobility  Muscle weakness (generalized)  Unsteadiness on feet     Problem List Patient Active Problem List   Diagnosis Date Noted  . Left knee pain 09/25/2015  . Adjustment disorder with depressed mood   . Tetraplegia ([place]) 09/19/2014  . Acute blood loss anemia 09/19/2014  . Cervical spondylosis with myelopathy 09/18/2014  . Radiculopathy of arm 08/28/2014  . Hypogonadism male 09/08/2013  . 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 Cardiomyopathy 04/01/2011  . Wolff-Parkinson-White (WPW) syndrome 04/01/2011    Cameron Sprang, PT, MPT [facility] [address] [place], [place], [address] Phone: [phone]   Fax:  [phone] 03/07/16, 3:01 PM  Name: Mark Gallagher MRN: [id number] Date of Birth: 19-Aug-1970

## 2016-03-07 NOTE — Patient Instructions (Signed)
Side-Stepping    Walk to left side with eyes open. Do at the counter top in case you need support, but try and keep hands off.  Use the cane.  Take even but large steps, leading with same foot. Make sure each foot lifts off the floor. Repeat in opposite direction. Repeat for 3 laps down and back. Do _1___ sessions per day.  Copyright  VHI. All rights reserved.    Abduction: Clam (Eccentric) - Side-Lying    Lie on side with knees bent. Lift top knee, keeping feet together. Keep trunk steady, so try to keep hip forward, don't rotate back. Slowly lower for 3-5 seconds. _10__ reps per set (on left side, but you can do both), _1__ sets per day, _5-7__ days per week.   http://ecce.exer.us/64   Copyright  VHI. All rights reserved.   Remember:  Slow down at work when walking over uneven surfaces and also leading with Left foot when stepping up onto mat in order to ensure foot in proper placement.

## 2016-03-14 ENCOUNTER — Ambulatory Visit: Payer: Managed Care, Other (non HMO) | Admitting: Rehabilitation

## 2016-03-14 ENCOUNTER — Encounter: Payer: Self-pay | Admitting: Rehabilitation

## 2016-03-14 ENCOUNTER — Ambulatory Visit (INDEPENDENT_AMBULATORY_CARE_PROVIDER_SITE_OTHER): Payer: 59 | Admitting: Urgent Care

## 2016-03-14 ENCOUNTER — Ambulatory Visit (INDEPENDENT_AMBULATORY_CARE_PROVIDER_SITE_OTHER): Payer: 59

## 2016-03-14 VITALS — BP 110/68 | HR 63 | Temp 98.4°F | Resp 16 | Wt 235.8 lb

## 2016-03-14 DIAGNOSIS — R269 Unspecified abnormalities of gait and mobility: Secondary | ICD-10-CM

## 2016-03-14 DIAGNOSIS — R2681 Unsteadiness on feet: Secondary | ICD-10-CM | POA: Diagnosis not present

## 2016-03-14 DIAGNOSIS — R293 Abnormal posture: Secondary | ICD-10-CM

## 2016-03-14 DIAGNOSIS — R2689 Other abnormalities of gait and mobility: Secondary | ICD-10-CM

## 2016-03-14 DIAGNOSIS — M25512 Pain in left shoulder: Secondary | ICD-10-CM

## 2016-03-14 DIAGNOSIS — S42035A Nondisplaced fracture of lateral end of left clavicle, initial encounter for closed fracture: Secondary | ICD-10-CM | POA: Diagnosis not present

## 2016-03-14 DIAGNOSIS — M6281 Muscle weakness (generalized): Secondary | ICD-10-CM

## 2016-03-14 DIAGNOSIS — W19XXXA Unspecified fall, initial encounter: Secondary | ICD-10-CM | POA: Diagnosis not present

## 2016-03-14 MED ORDER — TRAMADOL HCL 50 MG PO TABS: 50.0000 mg | ORAL_TABLET | Freq: Three times a day (TID) | ORAL | 0 refills | Status: DC | PRN

## 2016-03-14 NOTE — Therapy (Signed)
[facility] [address] [place], [place], [address] Phone: [phone]   Fax:  [phone]  Physical Therapy Treatment  Patient Details  Name: Mark Gallagher MRN: [id number] Date of Birth: 01-Nov-1970 Referring Provider: Alger Simons, MD  Encounter Date: 03/14/2016      PT End of Session - 03/14/16 1615    Visit Number 6   Number of Visits 11   Date for PT Re-Evaluation 04/01/16   Authorization Type [employer], 60 visit limit combined   Authorization - Visit Number 6   Authorization - Number of Visits 60   PT Start Time 1018   PT Stop Time 1110   PT Time Calculation (min) 52 min   Equipment Utilized During Treatment Gait belt   Activity Tolerance Patient tolerated treatment well   Behavior During Therapy WFL for tasks assessed/performed      Past Medical History:  Diagnosis Date  . 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 Cancer ([place])   . History of pituitary tumor   . Knee pain, bilateral   . Testosterone deficiency   . Thyroid disease     Past Surgical History:  Procedure Laterality Date  . CARDIAC ELECTROPHYSIOLOGY STUDY AND ABLATION    . CERVICAL DISC SURGERY  2007   Anterior C4/5 vertebrectomy and c3-C6 laminoplasty with plating  . CRANIOTOMY FOR TUMOR     pituitary adenoma    There were no vitals filed for this visit.      Subjective Assessment - 03/14/16 1606    Subjective Reports back and knee pain today.    Patient is accompained by: Family member   Patient Stated Goals "I want to work on balance, curbs, and slopes."   Currently in Pain? Yes   Pain Score 3    Pain Location Knee   Pain Orientation Right;Left   Pain Descriptors / Indicators Aching   Pain Type Chronic pain   Pain Onset More than a month ago   Pain Frequency Constant   Aggravating Factors  sitting then getting up   Pain Relieving Factors aleve, walking   Multiple Pain Sites Yes   Pain Score 3   Pain Location Back   Pain Orientation  Lower   Pain Descriptors / Indicators Aching   Pain Type Chronic pain   Pain Onset More than a month ago   Pain Frequency Intermittent   Aggravating Factors  increased walking   Pain Relieving Factors stretching                         [facility] Adult PT Treatment/Exercise - 03/14/16 1036      Transfers   Transfers Sit to Stand;Stand to Sit   Sit to Stand 5: Supervision   Sit to Stand Details Verbal cues for sequencing;Verbal cues for technique   Sit to Stand Details (indicate cue type and reason) Pt still has difficulty keeping feet flat, esp when sitting as he tends to back all the way up to chair and due to sitting on lower surfaces/increased height of pt, he ends up on his toes.  Cues for maintaining a small distance from chair in order to allow both feet to remain flat and increase LE strength.   Note that this was somewhat difficult due to decreased lumbar flexibility and knee pain, however with practice with forward bending, he was able to perform somewhat better during session.    Five time sit to stand comments  21.88 secs with BUE  support  28.81 secs without UE support from 23" surface   Stand to Sit 5: Supervision   Stand to Sit Details (indicate cue type and reason) Verbal cues for sequencing;Verbal cues for technique   Stand to Sit Details See cues above     Ambulation/Gait   Ambulation/Gait Yes   Ambulation/Gait Assistance 6: Modified independent (Device/Increase time);5: Supervision   Ambulation/Gait Assistance Details Assessed gait indoors x 200' over level and work simulated areas (mat and tight areas) with SPC.  Pt able to negotiate safely during session at mod I level with good return understanding of side stepping through tight spaces.  Also assessed gait and curb outside with [facility].  Pt able to perform gait and curb at mod I level, however when attempted to perform without cane, requires up to mod A therefore recommend continued use of SPC.     Ambulation  Distance (Feet) 200 Feet   Assistive device Straight cane   Gait Pattern Step-through pattern;Decreased arm swing - right;Decreased arm swing - left;Decreased stance time - left;Decreased step length - right;Decreased hip/knee flexion - left;Left genu recurvatum;Lateral hip instability;Lateral trunk lean to right   Ambulation Surface Level;Unlevel;Indoor     Standardized Balance Assessment   Standardized Balance Assessment Timed Up and Go Test     Timed Up and Go Test   TUG Normal TUG   Normal TUG (seconds) 15.53  with [facility]     Self-Care   Self-Care Other Self-Care Comments   Other Self-Care Comments  Pt with questions regarding ending therapy following this POC.  Discussed that this POC is more of a "refresher" and to problem solve over things that are still difficult at community or work level.  Also to provide exercises to strengthen areas that are still showing deficits leading to gait and balance deviations.  Feel that if he sticks to HEP and follows recommendations for work negotiation, pt will be ready for D/C following this visit.  Feel that many of his deficits will remain due to nerve damage.                  PT Education - 03/14/16 1614    Education provided Yes   Education Details see self care   Person(s) Educated Patient;Parent(s)   Methods Explanation;Demonstration   Comprehension Verbalized understanding;Returned demonstration          PT Short Term Goals - 03/14/16 1052      PT SHORT TERM GOAL #1   Title Pt will perform initial HEP with mod I using paper handout to indicate safe HEP compliance, maximize functional gains made in PT. Target date for all STGS: 02/29/16   Baseline performing intermittently   Status Partially Met     PT SHORT TERM GOAL #2   Title Will assess BERG balance test and improve score 4 points from baseline in order to indicate decreased fall risk.     Baseline 38/56 on 02/29/16   Status Achieved     PT SHORT TERM GOAL #3   Title Pt  will improve TUG to <16 secs with LRAD in order to indicate decreased fall risk.     Baseline 15.53 secs with SPC on 03/14/16   Status Achieved     PT SHORT TERM GOAL #4   Title Pt will improve 5TSS to <19 seconds with LRAD in order to indicate improved functional strength.     Status Partially Met     PT SHORT TERM GOAL #5   Title Pt will  ambulate 500' w/ LRAD at mod I level over uneven indoor surfaces (thresholds, mats) to simulate safe negotiation in work environment.     Baseline met 03/14/16   Status Achieved           PT Long Term Goals - 02/01/16 1306      PT LONG TERM GOAL #1   Title Pt will be independent with final HEP in order to indicate improved functional mobility and balance.  (Target Date for all LTGs: 03/28/16)   Status New     PT LONG TERM GOAL #2   Title Pt will increase BERG balance by 8 points from baseline in order to indicate decreased fall risk.     Status New     PT LONG TERM GOAL #3   Title Pt will perform TUG </=13.5 secs w/ LRAD in order to indicate decreased fall risk.     Status New     PT LONG TERM GOAL #4   Title Pt will perform 5TSS in <16 seconds in order to indicate improved functional strength.     Status New     PT LONG TERM GOAL #5   Title Pt will negotiate >1000' over varying outdoor surfaces (including curb, ramp, inclines/declines, grassy surfaces) w/ LRAD at mod I level in order to indicate safe return to community and leisure activities.     Status New     Additional Long Term Goals   Additional Long Term Goals Yes     PT LONG TERM GOAL #6   Title Pt will perform simulated household activities w/ LRAD at mod I level in order to indicate independence with ADLs at home.     Status New               Plan - 03/14/16 1615    Clinical Impression Statement Skilled session focused on assessement of STGs.  Pt has met 3/5 STGs, partially meeting remaining 2 goals for HEP and 5TSS test for functional strength.  Pt making steady  progress towards LTGs.    Rehab Potential Good   PT Frequency 1x / week  then 2x/wk for 2 weeks   PT Duration 6 weeks  then 2x/wk for 2 weeks   PT Treatment/Interventions ADLs/Self Care Home Management;Electrical Stimulation;DME Instruction;Gait training;Stair training;Functional mobility training;Therapeutic activities;Therapeutic exercise;Balance training;Neuromuscular re-education;Patient/family education;Orthotic Fit/Training;Vestibular   PT Next Visit Plan  balance, work simulated tasks, negotiating over obstacles/uneven surfaces   Consulted and Agree with Plan of Care Patient;Family member/caregiver   Family Member Consulted pts mother      Patient will benefit from skilled therapeutic intervention in order to improve the following deficits and impairments:  Abnormal gait, Cardiopulmonary status limiting activity, Decreased activity tolerance, Decreased balance, Decreased endurance, Decreased mobility, Decreased strength, Impaired perceived functional ability, Impaired flexibility, Improper body mechanics, Postural dysfunction  Visit Diagnosis: Unsteadiness on feet  Abnormal posture  Other abnormalities of gait and mobility  Muscle weakness (generalized)  Abnormality of gait     Problem List Patient Active Problem List   Diagnosis Date Noted  . Left knee pain 09/25/2015  . Adjustment disorder with depressed mood   . Tetraplegia ([place]) 09/19/2014  . Acute blood loss anemia 09/19/2014  . Cervical spondylosis with myelopathy 09/18/2014  . Radiculopathy of arm 08/28/2014  . Hypogonadism male 09/08/2013  . 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 Cardiomyopathy 04/01/2011  . Wolff-Parkinson-White (WPW) syndrome 04/01/2011    Cameron Sprang, PT, MPT [facility] [address] [place],  [place], [address] Phone: [phone]   Fax:  [phone] 03/14/16, 4:20 PM  Name: Mark Gallagher MRN: [id number] Date of Birth: 19-Nov-1970

## 2016-03-14 NOTE — Progress Notes (Signed)
  MRN: [id number] DOB: 1970-09-20  Subjective:   Mark Gallagher is a 46 y.o. male presenting for chief complaint of [place] (on left shoulder around 11:45 am)  Reports that suffered a fall off of on step today, landed on his left shoulder. He has since had pain and difficulty moving his shoulder. Has tried APAP, heat, and icing with some relief. Of note, patient reports that his ROM has decreased on his left side anyway since he had spinal fusion of his cervical vertebra. Denies redness, swelling, bony deformity.   Mark Gallagher has a current medication list which includes the following prescription(s): vitamin d3, enalapril, ferrous sulfate, hydrocortisone, levothyroxine, muscle rub, octreotide, testosterone cypionate, amiodarone, and bisoprolol, and the following Facility-Administered Medications: octreotide. Also is allergic to oxycodone.  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 Also  has a past surgical history that includes Cervical disc surgery (2007); Craniotomy for tumor; and Cardiac electrophysiology study and ablation.  Objective:   Vitals: BP 110/68   Pulse 63   Temp 98.4 F (36.9 C) (Oral)   Resp 16   Wt 235 lb 12.8 oz (107 kg)   SpO2 98%   BMI 25.90 kg/m   Physical Exam  Constitutional: He is oriented to person, place, and time. He appears well-developed and well-nourished.  Cardiovascular: Normal rate.   Pulmonary/Chest: Effort normal.  Musculoskeletal:       Left shoulder: He exhibits decreased range of motion (flexion, extension), tenderness (with external rotation) and decreased strength. He exhibits no bony tenderness, no swelling, no effusion, no crepitus, no deformity, no laceration and no spasm.  Neurological: He is alert and oriented to person, place, and time.   Dg Shoulder Left  Result Date: 03/14/2016 CLINICAL DATA:  Left shoulder injury  from fall this morning. EXAM: LEFT SHOULDER - 2+ VIEW COMPARISON:  Chest x-ray 11/24/2009 FINDINGS: Exam demonstrates an undisplaced distal left clavicle fracture. Remainder of the exam is within normal. IMPRESSION: Minimally displaced distal left clavicle fracture. Electronically Signed   By: Marin Olp M.D.   On: 03/14/2016 17:14   Assessment and Plan :   1. Closed nondisplaced fracture of acromial end of left clavicle, initial encounter 2. Fall, initial encounter 3. Acute pain of left shoulder - Will have patient wear arm sling for immobilization for his clavicular fracture. Pain control reviewed. Follow up in 1-2 weeks. Consider referral to ortho.  Jaynee Eagles, PA-C [facility] [phone] 03/14/2016  4:21 PM

## 2016-03-14 NOTE — Addendum Note (Signed)
Addended by: Jaynee Eagles on: 03/14/2016 05:46 PM   Modules accepted: Orders

## 2016-03-14 NOTE — Patient Instructions (Addendum)
Please wear your arm sling continuously and limit your shoulder movement to less than 30 degrees in every direction as much as possible. You can follow up with [place] in 1-2 weeks depending on your pain level. The arm sling will have to be worn between 3-6 weeks and may take 8-12 weeks to heal. We can always refer you to an orthopedist if have you have worsening symptoms, concerns.   For pain and inflammation please start scheduling Tylenol 500mg  with ibuprofen 400-600mg  ibuprofen every 6 hours with food. Use Tramadol for breakthrough pain.    Clavicle Fracture The clavicle, also called the collarbone, is the long bone that connects your shoulder to your rib cage. You can feel your collarbone at the top of your shoulders and rib cage. A clavicle fracture is a broken clavicle. It is a common injury that can happen at any age. What are the causes? Common causes of a clavicle fracture include:  A direct blow to your shoulder.  A car accident.  A fall, especially if you try to break your fall with an outstretched arm. What increases the risk? You may be at increased risk if:  You are younger than 25 years or older than 38 years. Most clavicle fractures happen to people who are younger than 25 years.  You are a male.  You play contact sports. What are the signs or symptoms? A fractured clavicle is painful. It also makes it hard to move your arm. Other signs and symptoms may include:  A shoulder that drops downward and forward.  Pain when trying to lift your shoulder.  Bruising, swelling, and tenderness over your clavicle.  A grinding noise when you try to move your shoulder.  A bump over your clavicle. How is this diagnosed? Your health care provider can usually diagnose a clavicle fracture by asking about your injury and examining your shoulder and clavicle. He or she may take an X-ray to determine the position of your clavicle. How is this treated? Treatment depends on the position of  your clavicle after the fracture:  If the broken ends of the bone are not out of place, your health care provider may put your arm in a sling or wrap a support bandage around your chest (figure-of-eight wrap).  If the broken ends of the bone are out of place, you may need surgery. Surgery may involve placing screws, pins, or plates to keep your clavicle stable while it heals. Healing may take about 3 months. When your health care provider thinks your fracture has healed enough, you may have to do physical therapy to regain normal movement and build up your arm strength. Follow these instructions at home:  Apply ice to the injured area:  Put ice in a plastic bag.  Place a towel between your skin and the bag.  Leave the ice on for 20 minutes, 2-3 times a day.  If you have a wrap or splint:  Wear it all the time, and remove it only to take a bath or shower.  When you bathe or shower, keep your shoulder in the same position as when the sling or wrap is on.  Do not lift your arm.  If you have a figure-of-eight wrap:  Another person must tighten it every day.  It should be tight enough to hold your shoulders back.  Allow enough room to place your index finger between your body and the strap.  Loosen the wrap immediately if you feel numbness or tingling in your  hands.  Only take medicines as directed by your health care provider.  Avoid activities that make the injury or pain worse for 4-6 weeks after surgery.  Keep all follow-up appointments. Contact a health care provider if: Your medicine is not helping to relieve pain and swelling. Get help right away if: Your arm is numb, cold, or pale, even when the splint is loose. This information is not intended to replace advice given to you by your health care provider. Make sure you discuss any questions you have with your health care provider. Document Released: 10/16/2004 Document Revised: 06/14/2015 Document Reviewed:  11/29/2012 Elsevier Interactive Patient Education  2017 [employer].   IF you received an x-ray today, you will receive an invoice from [facility]. Please contact [facility] at [phone] with questions or concerns regarding your invoice.   IF you received labwork today, you will receive an invoice from [place]. Please contact [facility] at [phone] with questions or concerns regarding your invoice.   Our billing staff will not be able to assist you with questions regarding bills from these companies.  You will be contacted with the lab results as soon as they are available. The fastest way to get your results is to activate your My Chart account. Instructions are located on the last page of this paperwork. If you have not heard from [place] regarding the results in 2 weeks, please contact this office.

## 2016-03-17 ENCOUNTER — Telehealth: Payer: Self-pay

## 2016-03-17 ENCOUNTER — Ambulatory Visit: Payer: Managed Care, Other (non HMO) | Admitting: Rehabilitation

## 2016-03-17 NOTE — Telephone Encounter (Signed)
Patient needs FMLA forms completed by [facility], for his last OV for a broken arm. I have completed what I could from the notes and highlighted the areas that I was not sure about. I will place the forms in your box on 03/17/16 if you could please return them to the FMLA/Disability box at the [address] checkout desk within 5-7 business days. Thank you!

## 2016-03-19 NOTE — Telephone Encounter (Signed)
I will review and complete this with patient at his visit tomorrow, 03/20/2016.

## 2016-03-20 ENCOUNTER — Encounter: Payer: Self-pay | Admitting: Urgent Care

## 2016-03-20 ENCOUNTER — Ambulatory Visit (INDEPENDENT_AMBULATORY_CARE_PROVIDER_SITE_OTHER): Payer: 59 | Admitting: Urgent Care

## 2016-03-20 VITALS — BP 116/72 | HR 40 | Temp 98.9°F | Resp 18 | Ht >= 80 in

## 2016-03-20 DIAGNOSIS — R58 Hemorrhage, not elsewhere classified: Secondary | ICD-10-CM

## 2016-03-20 DIAGNOSIS — M25512 Pain in left shoulder: Secondary | ICD-10-CM | POA: Diagnosis not present

## 2016-03-20 DIAGNOSIS — W19XXXD Unspecified fall, subsequent encounter: Secondary | ICD-10-CM

## 2016-03-20 DIAGNOSIS — S42035A Nondisplaced fracture of lateral end of left clavicle, initial encounter for closed fracture: Secondary | ICD-10-CM

## 2016-03-20 DIAGNOSIS — E220 Acromegaly and pituitary gigantism: Secondary | ICD-10-CM

## 2016-03-20 DIAGNOSIS — E291 Testicular hypofunction: Secondary | ICD-10-CM | POA: Diagnosis not present

## 2016-03-20 MED ORDER — TESTOSTERONE CYPIONATE 200 MG/ML IM SOLN
100.0000 mg | Freq: Once | INTRAMUSCULAR | Status: AC
  Administered 2016-03-20: 100 mg via INTRAMUSCULAR

## 2016-03-20 NOTE — Telephone Encounter (Signed)
That is fine just make sure once they are completed a copy is placed in my box at [address] checkout or on my desk at 104 in the back because we have to have a copy for his chart

## 2016-03-20 NOTE — Addendum Note (Signed)
Addended by: Alfredia Ferguson A on: 03/20/2016 11:46 AM   Modules accepted: Orders

## 2016-03-20 NOTE — Patient Instructions (Addendum)
Clavicle Fracture A clavicle fracture is a break in the long bone that connects the shoulder to the chest wall (clavicle). The clavicle is also called the collarbone. A clavicle fracture is a common injury that can happen at any age. What are the causes? Common causes of a clavicle fracture include:  A hard, direct hit (blow) to the shoulder.  A car accident.  A fall, especially if you try to break your fall with an outstretched arm. What increases the risk? You are more likely to develop this condition if:  You are younger than 83 or older than 83. Most clavicle fractures happen to people who are younger than 25.  You are a male.  You play contact sports. What are the signs or symptoms? Symptoms of this condition include:  Pain.  Difficulty moving the arm.  A shoulder that drops downward and forward.  Pain when trying to lift the shoulder.  Bruising, swelling, and tenderness over the clavicle.  A grinding noise when you try to move the shoulder.  A bump over the clavicle. How is this diagnosed? This condition is diagnosed based on:  Your medical history.  A physical exam.  X-rays to determine the position of your clavicle. How is this treated? Treatment for this condition depends on the position of your clavicle after the fracture:  If the broken ends of the bone are not out of place, your health care provider may put your arm in a sling.  If the broken ends of the bone are out of place, you may need surgery. Surgery may involve placing screws, pins, or plates to keep your clavicle stable while it heals. When your health care provider thinks your fracture has healed enough, you may have to do physical therapy to regain normal movement and build up your arm strength. Follow these instructions at home: If you have a sling:   Wear the sling as told by your health care provider. Remove it only as told by your health care provider.  Loosen the sling if your fingers  tingle, become numb, or turn cold and blue.  Do not lift your arm. Keep it across your chest.  Keep the sling clean.  Ask your health care provider if you may remove the sling for bathing.  If your sling is not waterproof, do not let it get wet. Cover the sling with a watertight covering if you take a bath or a shower while wearing it.  If you may remove your sling when you take a bath or a shower, keep your shoulder in the same position as when the sling is on. Managing pain, stiffness, and swelling   If directed, put ice on the injured area:  If you have a removable sling, remove it as told by your health care provider.  Put ice in a plastic bag.  Place a towel between your skin and the bag.  Leave the ice on for 20 minutes, 2-3 times a day. Activity   Avoid activities that make your symptoms worse for 4-6 weeks, or as long as directed.  Ask your health care provider when it is safe for you to drive.  Do exercises as told by your health care provider. General instructions   Do not use any products that contain nicotine or tobacco, such as cigarettes and e-cigarettes. These can delay bone healing. If you need help quitting, ask your health care provider.  Take over-the-counter and prescription medicines only as told by your health care provider.  Keep all follow-up visits as told by your health care provider. This is important. Contact a health care provider if:  Your medicine is not helping to relieve pain and swelling. Get help right away if:  Your arm is numb, cold, or pale, even when your splint is loose. Summary  The clavicle, also called the collarbone, is the long bone that connects the shoulder to the chest wall.  Treatment for this condition depends on the position of your clavicle after the fracture.  You may need to do physical therapy after your injury has healed enough.  If you have a sling, wear the sling as told by your health care provider. This  information is not intended to replace advice given to you by your health care provider. Make sure you discuss any questions you have with your health care provider. Document Released: 10/16/2004 Document Revised: 11/26/2015 Document Reviewed: 11/26/2015 Elsevier Interactive Patient Education  2017 [employer].     IF you received an x-ray today, you will receive an invoice from [facility]. Please contact [facility] at [phone] with questions or concerns regarding your invoice.   IF you received labwork today, you will receive an invoice from [place]. Please contact [facility] at [phone] with questions or concerns regarding your invoice.   Our billing staff will not be able to assist you with questions regarding bills from these companies.  You will be contacted with the lab results as soon as they are available. The fastest way to get your results is to activate your My Chart account. Instructions are located on the last page of this paperwork. If you have not heard from [place] regarding the results in 2 weeks, please contact this office.

## 2016-03-20 NOTE — Progress Notes (Signed)
    MRN: [id number] DOB: 1970-07-10  Subjective:   Mark Gallagher is a 46 y.o. male presenting for follow up on left shoulder pain, clavicular fracture. Patient was placed in arm sling, anticipatory guidance provided 03/14/2016 and scheduled for follow up today. Patient is left handed, stated that he would try to work and let me know about any difficulties he experienced. Today, he reports his pain is improved but still experiences pain daily. He is managing this with Tylenol and ibuprofen. He has not needed Tramadol. Reports bruising over the left clavicle and upper chest. Denies shoulder pain, swelling, warmth, redness. He has worn his arm sling consistently and is limiting use of his left arm/hand. Patient has not been back to work given that he is left handed and is required to actively use his hands at work. We will complete his FMLA forms today.   Mark Gallagher has a current medication list which includes the following prescription(s): amiodarone, bisoprolol, vitamin d3, enalapril, ferrous sulfate, hydrocortisone, levothyroxine, muscle rub, octreotide, testosterone cypionate, and tramadol, and the following Facility-Administered Medications: octreotide. Also is allergic to oxycodone.  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 Also  has a past surgical history that includes Cervical disc surgery (2007); Craniotomy for tumor; and Cardiac electrophysiology study and ablation.  Objective:   Vitals: BP 116/72 (BP Location: Right Arm, Patient Position: Sitting, Cuff Size: Small)   Pulse (!) 40   Temp 98.9 F (37.2 C) (Oral)   Resp 18   Ht 6\' 8"  (2.032 m)   SpO2 98%   Physical Exam  Constitutional: He is oriented to person, place, and time. He appears well-developed and well-nourished.  Cardiovascular: Normal rate.   Pulmonary/Chest: Effort normal.  Musculoskeletal:   Left shoulder: He exhibits decreased range of motion, tenderness (over area depicted with associated ecchymosis), bony tenderness and decreased strength (left hand grasping is 4/5 compared to right hand 5/5). He exhibits no swelling, no effusion, no crepitus, no deformity and no spasm.       Arms: Neurological: He is alert and oriented to person, place, and time.  Skin: Skin is warm and dry.   Assessment and Plan :   1. Closed nondisplaced fracture of acromial end of left clavicle, initial encounter 2. Arthralgia of left acromioclavicular joint 3. Ecchymosis 4. Fall, subsequent encounter - Continue management as discussed in clinic. Use APAP and ibuprofen as needed. Wear arm sling. Plan will be to maintain this for 3 weeks, re-evaluate at that time. Consider additional work restrictions or time off as needed.   Jaynee Eagles, PA-C [facility] [phone] 03/20/2016 10:27 AM

## 2016-03-21 ENCOUNTER — Ambulatory Visit: Payer: Managed Care, Other (non HMO) | Admitting: Rehabilitation

## 2016-03-24 ENCOUNTER — Encounter
Payer: Managed Care, Other (non HMO) | Attending: Physical Medicine & Rehabilitation | Admitting: Physical Medicine & Rehabilitation

## 2016-03-24 ENCOUNTER — Encounter: Payer: Self-pay | Admitting: Physical Medicine & Rehabilitation

## 2016-03-24 VITALS — BP 106/70 | HR 67

## 2016-03-24 DIAGNOSIS — M4712 Other spondylosis with myelopathy, cervical region: Secondary | ICD-10-CM

## 2016-03-24 DIAGNOSIS — M1712 Unilateral primary osteoarthritis, left knee: Secondary | ICD-10-CM | POA: Diagnosis present

## 2016-03-24 DIAGNOSIS — M5410 Radiculopathy, site unspecified: Secondary | ICD-10-CM | POA: Diagnosis not present

## 2016-03-24 DIAGNOSIS — M25562 Pain in left knee: Secondary | ICD-10-CM | POA: Insufficient documentation

## 2016-03-24 DIAGNOSIS — S42032D Displaced fracture of lateral end of left clavicle, subsequent encounter for fracture with routine healing: Secondary | ICD-10-CM | POA: Insufficient documentation

## 2016-03-24 NOTE — Patient Instructions (Signed)
PLEASE FEEL FREE TO CALL OUR OFFICE WITH ANY PROBLEMS OR QUESTIONS [phone])      Make sure you address shoulder range of motion as soon as you can

## 2016-03-24 NOTE — Progress Notes (Signed)
Subjective:    Patient ID: Mark Gallagher, male    DOB: 1970/07/26, 46 y.o.   MRN: [id number]  HPI   Mark Gallagher is here in follow up of his chronic gait disorder and myelopathy. He slipped and fell about a week ago and broke his left clavicle. I reviewed his left shoulder films which show a non-displaced distal fracture about 1" from the end of clavicle. Otherwise he had been doing well and working with PT at neuro rehab. He has been using a quad cane which has helped his blance. He feels that this fall was a freak event.    Pain Inventory Average Pain 4 Pain Right Now 3 My pain is stabbing  In the last 24 hours, has pain interfered with the following? General activity 0 Relation with others 0 Enjoyment of life 0 What TIME of day is your pain at its worst? morning Sleep (in general) Fair  Pain is worse with: bending, inactivity and standing Pain improves with: rest, therapy/exercise and medication Relief from Meds: 7  Mobility walk with assistance use a cane how many minutes can you walk? 10 ability to climb steps?  yes do you drive?  yes Do you have any goals in this area?  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  no  Physicians involved in your care Any changes since last visit?  no   Family History  Problem Relation Age of Onset  . High blood pressure Mother   . Acute myelogenous leukemia Brother    Social History   Social History  . Marital status: Single    Spouse name: N/A  . Number of children: N/A  . Years of education: N/A   Social History Main Topics  . Smoking status: Never Smoker  . Smokeless tobacco: Never Used  . Alcohol use No  . Drug use: No  . Sexual activity: Not Asked   Other Topics Concern  . None   Social History Narrative  . None   Past Surgical History:    Procedure Laterality Date  . CARDIAC ELECTROPHYSIOLOGY STUDY AND ABLATION    . CERVICAL DISC SURGERY  2007   Anterior C4/5 vertebrectomy and c3-C6 laminoplasty with plating  . CRANIOTOMY FOR TUMOR     pituitary adenoma   Past Medical History:  Diagnosis Date  . 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 Cancer ([place])   . History of pituitary tumor   . Knee pain, bilateral   . Testosterone deficiency   . Thyroid disease    BP 106/70 (BP Location: Right Arm, Patient Position: Sitting, Cuff Size: Large)   Pulse 67   SpO2 98%   Opioid Risk Score:   Fall Risk Score:  `1  Depression screen PHQ 2/9  Depression screen [facility] 2/9 03/20/2016 03/14/2016 11/19/2015 03/27/2015 02/18/2015 01/23/2015 10/19/2014  Decreased Interest 0 0 0 0 0 0 0  Down, Depressed, Hopeless 0 0 0 0 0 0 0  PHQ - 2 Score 0 0 0 0 0 0 0    Review of Systems  Constitutional: Negative.   HENT: Negative.   Eyes: Negative.   Respiratory: Negative.   Cardiovascular: Negative.   Gastrointestinal: Negative.   Endocrine: Negative.   Genitourinary: Negative.   Musculoskeletal: Positive for gait problem.  Skin: Negative.   Allergic/Immunologic: Negative.   Hematological: Negative.   All  other systems reviewed and are negative.      Objective:   Physical Exam  Constitutional: He is oriented to person, place, and time. He appears well-developed and well-nourished. Large frame/head  HENT: thrush on tongue  Head: Atraumatic.  Eyes: disconjugate gaze Neck:supple Cardiovascular: RRR.  No murmur heard.  Respiratory: CTA B.  GI: Soft. Bowel sounds are normal. He exhibits no distension. There is no tenderness.  Musculoskeletal: He exhibits improving. no edema left hand Pain over left distal clavicle.  Neurological: He is alert and oriented to person, place, and time. RUE: 5/5 deltoid, bicep, tricep, HI. LUE: 4+ to 5/5 delt, bicep, tricep, 5-/5wrist and HI.Positive left pronator drift. Sensation 1+/2 LUE and LLE. RLE: 4 HF, 4 to 4+/5 KE and  ADF/APF. LLE: 4+HF and KE and 3/5 ADF/4/5APF. Sensation 1++/2.  balance better with quad cane. Still wide based and deliberate Skin: Skin is warm and dry.  Psychiatric: He has a normal mood and affect. His behavior is normal. Judgment and thought content normal.   Assessment & Plan:  Medical Problem List and Plan: 1. Functional deficits secondary to C6 SCI with incomplete myelopathy  -continue therapy to address gait/balance/form----can resume at any time.  -left knee brace to for support is needed -quad cane for balance.  -needs HEP also. Provided extensive knee strengthening exercises today which we reviewed 3. Pain Management:  -tylenol prn  -HEP 4. Hypogonadism: Depotestosterone per home regimen--needs to check with endocrine about labwork. May need adjustment in thyroid or testosterone supplementation. 5. Left clavicle fracture  -sling  -shoulder ROM--advance per ortho  13. H/o Acromegaly: Treated with Sandostatin  14. Left knee pain, likely OA--- -knee strengthening exercises provided -ice/tylenol for pain   Follow up with me in 61months. 15 minutes of face to face patient care time were spent during this visit. All questions were encouraged and answered. Greater than 50% of time during this encounter was spent counseling patient/family in regard to his clavicle fracture and recovery.

## 2016-03-26 ENCOUNTER — Ambulatory Visit: Payer: Managed Care, Other (non HMO) | Admitting: Physical Therapy

## 2016-03-28 ENCOUNTER — Ambulatory Visit: Payer: Managed Care, Other (non HMO) | Admitting: Rehabilitation

## 2016-04-02 NOTE — Progress Notes (Unsigned)
Injection only

## 2016-04-02 NOTE — Progress Notes (Signed)
Injection only

## 2016-04-03 ENCOUNTER — Ambulatory Visit (INDEPENDENT_AMBULATORY_CARE_PROVIDER_SITE_OTHER): Payer: 59 | Admitting: Urgent Care

## 2016-04-03 ENCOUNTER — Encounter: Payer: Self-pay | Admitting: Urgent Care

## 2016-04-03 VITALS — BP 106/80 | Temp 97.7°F | Resp 18 | Ht >= 80 in | Wt 233.0 lb

## 2016-04-03 DIAGNOSIS — S42035A Nondisplaced fracture of lateral end of left clavicle, initial encounter for closed fracture: Secondary | ICD-10-CM | POA: Diagnosis not present

## 2016-04-03 DIAGNOSIS — M25512 Pain in left shoulder: Secondary | ICD-10-CM | POA: Diagnosis not present

## 2016-04-03 DIAGNOSIS — E291 Testicular hypofunction: Secondary | ICD-10-CM | POA: Diagnosis not present

## 2016-04-03 DIAGNOSIS — W19XXXD Unspecified fall, subsequent encounter: Secondary | ICD-10-CM

## 2016-04-03 MED ORDER — TESTOSTERONE CYPIONATE 200 MG/ML IM SOLN
100.0000 mg | Freq: Once | INTRAMUSCULAR | Status: AC
  Administered 2016-04-03: 100 mg via INTRAMUSCULAR

## 2016-04-03 NOTE — Progress Notes (Signed)
   MRN: [id number] DOB: 1970-03-12  Subjective:   Mark Gallagher is a 46 y.o. male presenting for follow up on clavicular fracture. Initial visit was 03/14/2016. Last f/u was 03/20/2016. Patient has been wearing arm sling for immobilization, placed on work restrictions. Patient did see his PT/ortho on 03/24/2016 and was advised to advance shoulder ROM. Today, reports improvement in his shoulder pain. However, he still has sharp pain at night while sleeping or sudden sharp pain when he sneezes, coughs, putting on a shirt. He is taking his arm out of his sling for about 10 minutes each day. Denies bony deformity, redness, swelling.  Mark Gallagher has a current medication list which includes the following prescription(s): amiodarone, bisoprolol, vitamin d3, enalapril, ferrous sulfate, hydrocortisone, levothyroxine, muscle rub, octreotide, testosterone cypionate, and tramadol, and the following Facility-Administered Medications: octreotide. Also is allergic to oxycodone.  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 Also  has a past surgical history that includes Cervical disc surgery (2007); Craniotomy for tumor; and Cardiac electrophysiology study and ablation.  Objective:   Vitals: Temp 97.7 F (36.5 C) (Oral)   Resp 18   Ht 6\' 8"  (2.032 m)   Wt 233 lb (105.7 kg)   SpO2 97%   BMI 25.60 kg/m   Physical Exam  Constitutional: He is oriented to person, place, and time. He appears well-developed and well-nourished.  Cardiovascular: Normal rate.   Pulmonary/Chest: Effort normal.  Musculoskeletal:       Left shoulder: He exhibits decreased range of motion (with abduction, extension and flexion beyond ~30 degrees) and bony tenderness (over distal 1/3 clavicle). He exhibits no tenderness, no swelling, no effusion, no crepitus and no deformity.  Neurological: He is alert and oriented  to person, place, and time.   Assessment and Plan :   1. Closed nondisplaced fracture of acromial end of left clavicle, initial encounter 2. Left shoulder pain, unspecified chronicity 3. Fall, subsequent encounter - Counseled patient on advancing ROM in extension, flexion and abduction. Patient will try this once daily within pain tolerance. He will maintain APAP and ibuprofen during the day. Use Tramadol nightly. Work restrictions updated. Will refer to ortho given patient is concerned about his progression.   Jaynee Eagles, PA-C [facility] [phone] 04/03/2016 10:46 AM

## 2016-04-03 NOTE — Addendum Note (Signed)
Addended by: Lanna Poche R on: 04/03/2016 12:12 PM   Modules accepted: Orders

## 2016-04-03 NOTE — Patient Instructions (Addendum)
- Advance your range of motion with your left shoulder. Perform flexion, extension and abduction as instructed in clinic once daily. Your orthopedist will dictate further rehabilitation instructions. For now, use Tramadol prior to going to bed and continue using Tylenol and ibuprofen during the day.    Clavicle Fracture A clavicle fracture is a break in the long bone that connects the shoulder to the chest wall (clavicle). The clavicle is also called the collarbone. A clavicle fracture is a common injury that can happen at any age. What are the causes? Common causes of a clavicle fracture include:  A hard, direct hit (blow) to the shoulder.  A car accident.  A fall, especially if you try to break your fall with an outstretched arm. What increases the risk? You are more likely to develop this condition if:  You are younger than 71 or older than 59. Most clavicle fractures happen to people who are younger than 25.  You are a male.  You play contact sports. What are the signs or symptoms? Symptoms of this condition include:  Pain.  Difficulty moving the arm.  A shoulder that drops downward and forward.  Pain when trying to lift the shoulder.  Bruising, swelling, and tenderness over the clavicle.  A grinding noise when you try to move the shoulder.  A bump over the clavicle. How is this diagnosed? This condition is diagnosed based on:  Your medical history.  A physical exam.  X-rays to determine the position of your clavicle. How is this treated? Treatment for this condition depends on the position of your clavicle after the fracture:  If the broken ends of the bone are not out of place, your health care provider may put your arm in a sling.  If the broken ends of the bone are out of place, you may need surgery. Surgery may involve placing screws, pins, or plates to keep your clavicle stable while it heals. When your health care provider thinks your fracture has healed  enough, you may have to do physical therapy to regain normal movement and build up your arm strength. Follow these instructions at home: If you have a sling:   Wear the sling as told by your health care provider. Remove it only as told by your health care provider.  Loosen the sling if your fingers tingle, become numb, or turn cold and blue.  Do not lift your arm. Keep it across your chest.  Keep the sling clean.  Ask your health care provider if you may remove the sling for bathing.  If your sling is not waterproof, do not let it get wet. Cover the sling with a watertight covering if you take a bath or a shower while wearing it.  If you may remove your sling when you take a bath or a shower, keep your shoulder in the same position as when the sling is on. Managing pain, stiffness, and swelling   If directed, put ice on the injured area:  If you have a removable sling, remove it as told by your health care provider.  Put ice in a plastic bag.  Place a towel between your skin and the bag.  Leave the ice on for 20 minutes, 2-3 times a day. Activity   Avoid activities that make your symptoms worse for 4-6 weeks, or as long as directed.  Ask your health care provider when it is safe for you to drive.  Do exercises as told by your health care provider.  General instructions   Do not use any products that contain nicotine or tobacco, such as cigarettes and e-cigarettes. These can delay bone healing. If you need help quitting, ask your health care provider.  Take over-the-counter and prescription medicines only as told by your health care provider.  Keep all follow-up visits as told by your health care provider. This is important. Contact a health care provider if:  Your medicine is not helping to relieve pain and swelling. Get help right away if:  Your arm is numb, cold, or pale, even when your splint is loose. Summary  The clavicle, also called the collarbone, is the long  bone that connects the shoulder to the chest wall.  Treatment for this condition depends on the position of your clavicle after the fracture.  You may need to do physical therapy after your injury has healed enough.  If you have a sling, wear the sling as told by your health care provider. This information is not intended to replace advice given to you by your health care provider. Make sure you discuss any questions you have with your health care provider. Document Released: 10/16/2004 Document Revised: 11/26/2015 Document Reviewed: 11/26/2015 Elsevier Interactive Patient Education  2017 [employer].      IF you received an x-ray today, you will receive an invoice from [facility]. Please contact [facility] at [phone] with questions or concerns regarding your invoice.   IF you received labwork today, you will receive an invoice from [facility]. Please contact [facility] at [phone] with questions or concerns regarding your invoice.   Our billing staff will not be able to assist you with questions regarding bills from these companies.  You will be contacted with the lab results as soon as they are available. The fastest way to get your results is to activate your My Chart account. Instructions are located on the last page of this paperwork. If you have not heard from [place] regarding the results in 2 weeks, please contact this office.

## 2016-04-17 ENCOUNTER — Ambulatory Visit (INDEPENDENT_AMBULATORY_CARE_PROVIDER_SITE_OTHER): Payer: 59 | Admitting: Physician Assistant

## 2016-04-17 ENCOUNTER — Ambulatory Visit: Payer: 59 | Admitting: Urgent Care

## 2016-04-17 VITALS — BP 110/70 | Temp 97.5°F

## 2016-04-17 DIAGNOSIS — E220 Acromegaly and pituitary gigantism: Secondary | ICD-10-CM | POA: Diagnosis not present

## 2016-04-17 DIAGNOSIS — I456 Pre-excitation syndrome: Secondary | ICD-10-CM

## 2016-04-17 DIAGNOSIS — E291 Testicular hypofunction: Secondary | ICD-10-CM

## 2016-04-17 MED ORDER — OCTREOTIDE ACETATE 30 MG IM KIT
30.0000 mg | PACK | Freq: Once | INTRAMUSCULAR | Status: AC
  Administered 2016-04-17: 30 mg via INTRAMUSCULAR

## 2016-04-17 MED ORDER — TESTOSTERONE CYPIONATE 200 MG/ML IM SOLN
100.0000 mg | Freq: Once | INTRAMUSCULAR | Status: AC
  Administered 2016-04-17: 100 mg via INTRAMUSCULAR

## 2016-04-19 NOTE — Progress Notes (Signed)
Pt here for injection today 3/29/18feeling well, asks for wanda

## 2016-05-01 ENCOUNTER — Ambulatory Visit (INDEPENDENT_AMBULATORY_CARE_PROVIDER_SITE_OTHER): Payer: 59 | Admitting: Physician Assistant

## 2016-05-01 ENCOUNTER — Encounter: Payer: Self-pay | Admitting: Physician Assistant

## 2016-05-01 VITALS — BP 110/64 | HR 98 | Temp 97.9°F

## 2016-05-01 DIAGNOSIS — E291 Testicular hypofunction: Secondary | ICD-10-CM

## 2016-05-01 MED ORDER — TESTOSTERONE CYPIONATE 200 MG/ML IM SOLN
100.0000 mg | Freq: Once | INTRAMUSCULAR | Status: AC
  Administered 2016-05-01: 100 mg via INTRAMUSCULAR

## 2016-05-01 NOTE — Patient Instructions (Signed)
Pt here for Testosterone injection only. 

## 2016-05-01 NOTE — Progress Notes (Signed)
Pt here for Testosterone injection only. 

## 2016-05-15 ENCOUNTER — Ambulatory Visit (INDEPENDENT_AMBULATORY_CARE_PROVIDER_SITE_OTHER): Payer: 59 | Admitting: Physician Assistant

## 2016-05-15 VITALS — BP 122/80 | Temp 98.0°F

## 2016-05-15 DIAGNOSIS — E291 Testicular hypofunction: Secondary | ICD-10-CM | POA: Diagnosis not present

## 2016-05-15 DIAGNOSIS — E220 Acromegaly and pituitary gigantism: Secondary | ICD-10-CM | POA: Diagnosis not present

## 2016-05-15 MED ORDER — TESTOSTERONE CYPIONATE 200 MG/ML IM SOLN
100.0000 mg | Freq: Once | INTRAMUSCULAR | Status: AC
  Administered 2016-05-15: 100 mg via INTRAMUSCULAR

## 2016-05-15 MED ORDER — OCTREOTIDE ACETATE 30 MG IM KIT
30.0000 mg | PACK | Freq: Once | INTRAMUSCULAR | Status: AC
  Administered 2016-05-15: 30 mg via INTRAMUSCULAR

## 2016-05-15 NOTE — Progress Notes (Signed)
Pt here for injections only.  

## 2016-05-30 ENCOUNTER — Ambulatory Visit (INDEPENDENT_AMBULATORY_CARE_PROVIDER_SITE_OTHER): Payer: 59 | Admitting: Family Medicine

## 2016-05-30 VITALS — BP 110/60 | Temp 97.7°F

## 2016-05-30 DIAGNOSIS — E291 Testicular hypofunction: Secondary | ICD-10-CM | POA: Diagnosis not present

## 2016-05-30 MED ORDER — TESTOSTERONE CYPIONATE 200 MG/ML IM SOLN
100.0000 mg | Freq: Once | INTRAMUSCULAR | Status: AC
  Administered 2016-05-30: 100 mg via INTRAMUSCULAR

## 2016-05-30 NOTE — Patient Instructions (Signed)
Pt here for Testosterone injection 200 mg/ml, 0.5 ml

## 2016-06-18 ENCOUNTER — Ambulatory Visit: Payer: 59 | Admitting: Family Medicine

## 2016-06-18 DIAGNOSIS — E220 Acromegaly and pituitary gigantism: Secondary | ICD-10-CM

## 2016-06-18 DIAGNOSIS — E291 Testicular hypofunction: Secondary | ICD-10-CM

## 2016-06-18 MED ORDER — TESTOSTERONE CYPIONATE 200 MG/ML IM SOLN
100.0000 mg | Freq: Once | INTRAMUSCULAR | Status: AC
  Administered 2016-06-18: 100 mg via INTRAMUSCULAR

## 2016-06-18 NOTE — Progress Notes (Unsigned)
Patient was here for Sandostatin LAR depot and testosterone injection only

## 2016-07-04 ENCOUNTER — Ambulatory Visit (INDEPENDENT_AMBULATORY_CARE_PROVIDER_SITE_OTHER): Payer: 59 | Admitting: Family Medicine

## 2016-07-04 VITALS — BP 110/72 | Temp 98.4°F

## 2016-07-04 DIAGNOSIS — E291 Testicular hypofunction: Secondary | ICD-10-CM | POA: Diagnosis not present

## 2016-07-04 MED ORDER — TESTOSTERONE CYPIONATE 200 MG/ML IM SOLN
100.0000 mg | Freq: Once | INTRAMUSCULAR | Status: AC
  Administered 2016-07-04: 100 mg via INTRAMUSCULAR

## 2016-07-04 NOTE — Patient Instructions (Signed)
Pt here for Testosterone injection only. 

## 2016-07-04 NOTE — Progress Notes (Signed)
Pt here for Testosterone injection only. 

## 2016-07-14 ENCOUNTER — Encounter: Payer: Self-pay | Admitting: Rehabilitation

## 2016-07-14 NOTE — Therapy (Signed)
[facility] [address] [place], [place], [address] Phone: [phone]   Fax:  [phone]  Patient Details  Name: Mark Gallagher MRN: [id number] Date of Birth: 06-01-1970 Referring Provider:  No ref. provider found  Encounter Date: 07/14/2016    PHYSICAL THERAPY DISCHARGE SUMMARY  Visits from Start of Care: 6  Current functional level related to goals / functional outcomes:     PT Long Term Goals - 02/01/16 1306      PT LONG TERM GOAL #1   Title Pt will be independent with final HEP in order to indicate improved functional mobility and balance.  (Target Date for all LTGs: 03/28/16)   Status New     PT LONG TERM GOAL #2   Title Pt will increase BERG balance by 8 points from baseline in order to indicate decreased fall risk.     Status New     PT LONG TERM GOAL #3   Title Pt will perform TUG </=13.5 secs w/ LRAD in order to indicate decreased fall risk.     Status New     PT LONG TERM GOAL #4   Title Pt will perform 5TSS in <16 seconds in order to indicate improved functional strength.     Status New     PT LONG TERM GOAL #5   Title Pt will negotiate >1000' over varying outdoor surfaces (including curb, ramp, inclines/declines, grassy surfaces) w/ LRAD at mod I level in order to indicate safe return to community and leisure activities.     Status New     Additional Long Term Goals   Additional Long Term Goals Yes     PT LONG TERM GOAL #6   Title Pt will perform simulated household activities w/ LRAD at mod I level in order to indicate independence with ADLs at home.     Status New        Remaining deficits: Unsure as pt did not return due to fall with cervical fracture.     Education / Equipment: Spoke with pt and recommend he use quad cane at all times to increase safety, has HEP from therapy.    Plan: Patient agrees to discharge.  Patient goals were not met. Patient is being discharged due to a  change in medical status.  ?????       Cameron Sprang, PT, MPT [facility] [address] [place], [place], [address] Phone: [phone]   Fax:  [phone] 07/14/16, 8:36 AM

## 2016-07-21 ENCOUNTER — Ambulatory Visit (INDEPENDENT_AMBULATORY_CARE_PROVIDER_SITE_OTHER): Payer: 59 | Admitting: Emergency Medicine

## 2016-07-21 VITALS — BP 100/60 | HR 38 | Temp 98.1°F | Wt 234.0 lb

## 2016-07-21 DIAGNOSIS — E291 Testicular hypofunction: Secondary | ICD-10-CM | POA: Diagnosis not present

## 2016-07-21 DIAGNOSIS — E220 Acromegaly and pituitary gigantism: Secondary | ICD-10-CM | POA: Diagnosis not present

## 2016-07-21 MED ORDER — TESTOSTERONE CYPIONATE 200 MG/ML IM SOLN
100.0000 mg | Freq: Once | INTRAMUSCULAR | Status: AC
  Administered 2016-07-21: 100 mg via INTRAMUSCULAR

## 2016-07-21 MED ORDER — OCTREOTIDE ACETATE 30 MG IM KIT
30.0000 mg | PACK | Freq: Once | INTRAMUSCULAR | Status: AC
  Administered 2016-07-21: 30 mg via INTRAMUSCULAR

## 2016-07-21 NOTE — Patient Instructions (Signed)
Pt here for Testosterone and Sandostatin LAR Depot injections.

## 2016-07-22 ENCOUNTER — Encounter
Payer: Managed Care, Other (non HMO) | Attending: Physical Medicine & Rehabilitation | Admitting: Physical Medicine & Rehabilitation

## 2016-07-24 ENCOUNTER — Encounter: Payer: Self-pay | Admitting: Emergency Medicine

## 2016-08-07 NOTE — Progress Notes (Signed)
Here for testosterone shot.

## 2016-08-08 ENCOUNTER — Encounter: Payer: Self-pay | Admitting: Family Medicine

## 2016-08-08 ENCOUNTER — Ambulatory Visit (INDEPENDENT_AMBULATORY_CARE_PROVIDER_SITE_OTHER): Payer: 59 | Admitting: Family Medicine

## 2016-08-08 VITALS — BP 105/58 | HR 37 | Temp 97.3°F | Resp 16

## 2016-08-08 DIAGNOSIS — E291 Testicular hypofunction: Secondary | ICD-10-CM

## 2016-08-08 MED ORDER — TESTOSTERONE CYPIONATE 200 MG/ML IM SOLN
100.0000 mg | Freq: Once | INTRAMUSCULAR | Status: AC
  Administered 2016-08-08: 100 mg via INTRAMUSCULAR

## 2016-08-19 NOTE — Progress Notes (Signed)
Injection only

## 2016-08-22 ENCOUNTER — Ambulatory Visit (INDEPENDENT_AMBULATORY_CARE_PROVIDER_SITE_OTHER): Payer: 59 | Admitting: Family Medicine

## 2016-08-22 ENCOUNTER — Encounter: Payer: Self-pay | Admitting: Family Medicine

## 2016-08-22 VITALS — BP 108/58 | HR 56 | Temp 98.1°F | Resp 16

## 2016-08-22 DIAGNOSIS — E220 Acromegaly and pituitary gigantism: Secondary | ICD-10-CM

## 2016-08-22 DIAGNOSIS — E291 Testicular hypofunction: Secondary | ICD-10-CM

## 2016-08-22 MED ORDER — TESTOSTERONE CYPIONATE 200 MG/ML IM SOLN
100.0000 mg | Freq: Once | INTRAMUSCULAR | Status: AC
  Administered 2016-08-22: 100 mg via INTRAMUSCULAR

## 2016-08-22 MED ORDER — OCTREOTIDE ACETATE 30 MG IM KIT
30.0000 mg | PACK | Freq: Once | INTRAMUSCULAR | Status: AC
  Administered 2016-08-22: 30 mg via INTRAMUSCULAR

## 2016-08-22 NOTE — Patient Instructions (Signed)
Pt here for injections only. Sandostatin LAR depot 30 mg, and Testosterone injection 0.5 ml

## 2016-08-22 NOTE — Addendum Note (Signed)
Addended by: Lanna Poche R on: 08/22/2016 10:02 AM   Modules accepted: Level of Service

## 2016-08-22 NOTE — Progress Notes (Signed)
Pt here for testosterone injection

## 2016-09-05 ENCOUNTER — Ambulatory Visit (INDEPENDENT_AMBULATORY_CARE_PROVIDER_SITE_OTHER): Payer: 59 | Admitting: Family Medicine

## 2016-09-05 ENCOUNTER — Encounter: Payer: Self-pay | Admitting: Family Medicine

## 2016-09-05 VITALS — BP 106/60 | HR 69 | Temp 97.7°F | Resp 16

## 2016-09-05 DIAGNOSIS — E291 Testicular hypofunction: Secondary | ICD-10-CM | POA: Diagnosis not present

## 2016-09-05 MED ORDER — TESTOSTERONE CYPIONATE 200 MG/ML IM SOLN
100.0000 mg | Freq: Once | INTRAMUSCULAR | Status: AC
  Administered 2016-09-05: 100 mg via INTRAMUSCULAR

## 2016-09-05 NOTE — Patient Instructions (Signed)
Pt here for Testosterone injections only

## 2016-09-05 NOTE — Progress Notes (Signed)
Pt here for Testosterone injections only.

## 2016-09-10 ENCOUNTER — Telehealth: Payer: Self-pay | Admitting: Urgent Care

## 2016-09-10 NOTE — Telephone Encounter (Signed)
[employer] needs a questionnaire completed by [employer] on this patient for his disability case that they are handling. I was not sure what to put on the forms so I left them blank. I will place the forms in Mani's box on 09/10/16 please complete and return to the FMLA/Disability box at the 102 checkout desk within 5-7 business days. Thank you!

## 2016-09-11 NOTE — Telephone Encounter (Signed)
Finally found the forms outside the provider lounge. Please schedule patient for an appointment to review his progress and disability needs. I have not seen the patient in a while and he may need to have this completed by the orthopedist but first let's see him back. Thank you!

## 2016-09-15 DIAGNOSIS — Z0271 Encounter for disability determination: Secondary | ICD-10-CM

## 2016-09-16 NOTE — Addendum Note (Signed)
Addended by: Delia Chimes A on: 09/16/2016 02:39 PM   Modules accepted: Level of Service

## 2016-09-18 ENCOUNTER — Ambulatory Visit (INDEPENDENT_AMBULATORY_CARE_PROVIDER_SITE_OTHER): Payer: 59 | Admitting: Emergency Medicine

## 2016-09-18 ENCOUNTER — Encounter: Payer: Self-pay | Admitting: Emergency Medicine

## 2016-09-18 VITALS — BP 105/65 | HR 69 | Temp 98.5°F | Resp 16 | Wt 241.6 lb

## 2016-09-18 DIAGNOSIS — E291 Testicular hypofunction: Secondary | ICD-10-CM | POA: Diagnosis not present

## 2016-09-18 DIAGNOSIS — E220 Acromegaly and pituitary gigantism: Secondary | ICD-10-CM | POA: Diagnosis not present

## 2016-09-18 MED ORDER — TESTOSTERONE CYPIONATE 200 MG/ML IM SOLN
200.0000 mg | Freq: Once | INTRAMUSCULAR | Status: AC
  Administered 2016-09-18: 200 mg via INTRAMUSCULAR

## 2016-09-18 MED ORDER — OCTREOTIDE ACETATE 30 MG IM KIT
30.0000 mg | PACK | Freq: Once | INTRAMUSCULAR | Status: AC
  Administered 2016-09-18: 30 mg via INTRAMUSCULAR

## 2016-09-18 NOTE — Progress Notes (Signed)
Patient ID: Mark Gallagher, male   DOB: 11-May-1970, 46 y.o.   MRN: [id number] Patient here for injections only, Sandostatin LAR Depot  and testosterone cypionate.

## 2016-09-19 NOTE — Telephone Encounter (Signed)
Have we completed these forms? Today is the 6th business days---please let me know thank you!

## 2016-09-25 NOTE — Telephone Encounter (Signed)
Please see the previous note left on 09/11/2016. Patient needs an office visit.    "Finally found the forms outside the provider lounge. Please schedule patient for an appointment to review his progress and disability needs. I have not seen the patient in a while and he may need to have this completed by the orthopedist but first let's see him back. Thank you!"

## 2016-09-29 NOTE — Telephone Encounter (Signed)
Unfortunately I have not evaluated him since 2016, so unable to provide more info at this point. I did not see recent ortho notes.  Has he seen ortho recently?  Their notes may speak to degree of disability.  I can also review questions in office if needed during office visit.

## 2016-09-29 NOTE — Telephone Encounter (Signed)
Patient has an appointment with Dr Carlota Raspberry, on 10/21/16---I know he has some insurance issues he has spoken to Mickel Baas about these so I am not sure if he is going to be able to come in for another OV. Can Dr Carlota Raspberry complete the forms without having to see him or does he just need to wait till 10/21/16

## 2016-09-30 NOTE — Telephone Encounter (Signed)
Ok I will talk to Mark Gallagher about his appointment since she knows what is going on with him.

## 2016-10-07 ENCOUNTER — Ambulatory Visit: Payer: 59 | Admitting: Urgent Care

## 2016-10-07 VITALS — BP 107/63 | HR 64 | Temp 97.3°F | Resp 16 | Wt 238.6 lb

## 2016-10-07 MED ORDER — TESTOSTERONE CYPIONATE 200 MG/ML IM SOLN
100.0000 mg | Freq: Once | INTRAMUSCULAR | Status: AC
  Administered 2018-09-22: 100 mg via INTRAMUSCULAR

## 2016-10-09 NOTE — Progress Notes (Signed)
I did not examine this patient. 

## 2016-10-21 ENCOUNTER — Encounter: Payer: Self-pay | Admitting: Family Medicine

## 2016-10-21 ENCOUNTER — Ambulatory Visit (INDEPENDENT_AMBULATORY_CARE_PROVIDER_SITE_OTHER): Payer: BLUE CROSS/BLUE SHIELD | Admitting: Physician Assistant

## 2016-10-21 VITALS — BP 100/72 | HR 38 | Temp 98.0°F | Resp 16

## 2016-10-21 DIAGNOSIS — E291 Testicular hypofunction: Secondary | ICD-10-CM | POA: Diagnosis not present

## 2016-10-21 DIAGNOSIS — E220 Acromegaly and pituitary gigantism: Secondary | ICD-10-CM

## 2016-10-21 MED ORDER — OCTREOTIDE ACETATE 30 MG IM KIT
30.0000 mg | PACK | Freq: Once | INTRAMUSCULAR | Status: AC
  Administered 2018-07-12: 30 mg via INTRAMUSCULAR

## 2016-10-21 MED ORDER — TESTOSTERONE CYPIONATE 200 MG/ML IM SOLN
100.0000 mg | Freq: Once | INTRAMUSCULAR | Status: AC
  Administered 2016-10-21: 100 mg via INTRAMUSCULAR

## 2016-10-21 NOTE — Patient Instructions (Signed)
Pt here for injections only.  

## 2016-10-30 ENCOUNTER — Ambulatory Visit (INDEPENDENT_AMBULATORY_CARE_PROVIDER_SITE_OTHER): Payer: BLUE CROSS/BLUE SHIELD | Admitting: Family Medicine

## 2016-10-30 VITALS — BP 120/77 | HR 70 | Temp 97.8°F | Resp 18 | Ht >= 80 in | Wt 245.0 lb

## 2016-10-30 DIAGNOSIS — E291 Testicular hypofunction: Secondary | ICD-10-CM

## 2016-10-30 DIAGNOSIS — Z862 Personal history of diseases of the blood and blood-forming organs and certain disorders involving the immune mechanism: Secondary | ICD-10-CM

## 2016-10-30 DIAGNOSIS — I429 Cardiomyopathy, unspecified: Secondary | ICD-10-CM

## 2016-10-30 DIAGNOSIS — M25562 Pain in left knee: Secondary | ICD-10-CM

## 2016-10-30 DIAGNOSIS — Z0000 Encounter for general adult medical examination without abnormal findings: Secondary | ICD-10-CM | POA: Diagnosis not present

## 2016-10-30 DIAGNOSIS — Z23 Encounter for immunization: Secondary | ICD-10-CM

## 2016-10-30 DIAGNOSIS — Z13220 Encounter for screening for lipoid disorders: Secondary | ICD-10-CM

## 2016-10-30 DIAGNOSIS — E220 Acromegaly and pituitary gigantism: Secondary | ICD-10-CM

## 2016-10-30 DIAGNOSIS — E039 Hypothyroidism, unspecified: Secondary | ICD-10-CM

## 2016-10-30 DIAGNOSIS — M4802 Spinal stenosis, cervical region: Secondary | ICD-10-CM

## 2016-10-30 DIAGNOSIS — M25561 Pain in right knee: Secondary | ICD-10-CM

## 2016-10-30 DIAGNOSIS — R2689 Other abnormalities of gait and mobility: Secondary | ICD-10-CM

## 2016-10-30 NOTE — Patient Instructions (Addendum)
Keep follow up with cardiologist and orthopaedist as planned.  Make sure you get bloodwork for repeat iron.   For testosterone supplement, I would recommend PSA, blood counts, lipid panel and liver tests.  Let me know if your specialist needs me to order those tests.  Tdap given today. You can ask your specialists if they think you need that vaccine.   I recommend against area rugs to lessen risk of falls. If those increase, would recommend meeting with neurologist.   If needed, we can meet to go through paperwork to see if it something I can complete, or if specialist or therapist needs to evaluate you to complete it.   Follow-up with me in one year for a physical, or sooner if any monitoring tests are needed. Let me know if you have any questions in the meantime.   Keeping you healthy  Get these tests  Blood pressure- Have your blood pressure checked once a year by your healthcare provider.  Normal blood pressure is 120/80.  Weight- Have your body mass index (BMI) calculated to screen for obesity.  BMI is a measure of body fat based on height and weight. You can also calculate your own BMI at GravelBags.it.  Cholesterol- Have your cholesterol checked regularly starting at age 74, sooner may be necessary if you have diabetes, high blood pressure, if a family member developed heart diseases at an early age or if you smoke.   Chlamydia, HIV, and other sexual transmitted disease- Get screened each year until the age of 76 then within three months of each new sexual partner.  Diabetes- Have your blood sugar checked regularly if you have high blood pressure, high cholesterol, a family history of diabetes or if you are overweight.  Get these vaccines  Flu shot- Every fall.  Tetanus shot- Every 10 years.  Menactra- Single dose; prevents meningitis.  Take these steps  Don't smoke- If you do smoke, ask your healthcare provider about quitting. For tips on how to quit, go to  www.smokefree.gov or call 1-800-QUIT-NOW.  Be physically active- Exercise 5 days a week for at least 30 minutes.  If you are not already physically active start slow and gradually work up to 30 minutes of moderate physical activity.  Examples of moderate activity include walking briskly, mowing the yard, dancing, swimming bicycling, etc.  Eat a healthy diet- Eat a variety of healthy foods such as fruits, vegetables, low fat milk, low fat cheese, yogurt, lean meats, poultry, fish, beans, tofu, etc.  For more information on healthy eating, go to www.thenutritionsource.org  Drink alcohol in moderation- Limit alcohol intake two drinks or less a day.  Never drink and drive.  Dentist- Brush and floss teeth twice daily; visit your dentis twice a year.  Depression-Your emotional health is as important as your physical health.  If you're feeling down, losing interest in things you normally enjoy please talk with your healthcare provider.  Gun Safety- If you keep a gun in your home, keep it unloaded and with the safety lock on.  Bullets should be stored separately.  Helmet use- Always wear a helmet when riding a motorcycle, bicycle, rollerblading or skateboarding.  Safe sex- If you may be exposed to a sexually transmitted infection, use a condom  Seat belts- Seat bels can save your life; always wear one.  Smoke/Carbon Monoxide detectors- These detectors need to be installed on the appropriate level of your home.  Replace batteries at least once a year.  Skin Cancer- When out  in the sun, cover up and use sunscreen SPF 15 or higher.  Violence- If anyone is threatening or hurting you, please tell your healthcare provider. IF you received an x-ray today, you will receive an invoice from [facility]. Please contact [facility] at [phone] with questions or concerns regarding your invoice.   IF you received labwork today, you will receive an invoice from [place]. Please contact  [facility] at [phone] with questions or concerns regarding your invoice.   Our billing staff will not be able to assist you with questions regarding bills from these companies.  You will be contacted with the lab results as soon as they are available. The fastest way to get your results is to activate your My Chart account. Instructions are located on the last page of this paperwork. If you have not heard from [place] regarding the results in 2 weeks, please contact this office.

## 2016-10-30 NOTE — Progress Notes (Addendum)
Subjective:  By signing my name below, I, [name], attest that this documentation has been prepared under the direction and in the presence of [name], MD. Electronically Signed: [name], [place]. 10/30/2016 , 2:41 PM .  Patient was seen in Room 11 .   Patient ID: Mark Gallagher, male    DOB: [birth year], 46 y.o.   MRN: [id number] Chief Complaint  Patient presents with  . Annual Exam   HPI Mark Gallagher is a 46 y.o. male  Here for annual physical. He has a history of multiple medical problems including acromegaly, Wolff-Parkinson-White (WPW) syndrome, hypogonadism, and HTN.   History of pituitary tumor with hypogonadism and acromegaly He was followed by [facility] endocrinologist, Dr. Paticia Stack. Resected in 1994 with some residual tumor on the left side. He takes Sandostatin. 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 He takes Octreotide every 4 weeks. His last appointment with endocrinologist was on July 6th. He has been provided a note for disability from endocrinology. He has Sandostatin injections done here; last received last Tuesday (9 days ago).   He is followed by [facility] endocrinology regularly every 6 months. They do full blood work there. His last lab work, his iron was a little low at 12.1. He was instructed to take a second iron, so he's been taking an iron pill in the morning and another pill at night. He will have blood work and hemoglobin checked again at [facility] endocrinology this coming Wednesday (in 6 days).   Testosterone deficiency He receives Depotestosterone injection 100mg . His growth hormone was 3.0 and IGF-1 testing was 278 on July 6th. His levels are checked by [facility] endocrinology.   Hypogonadism His last testosterone was in Dec 2017, at 803 with free of 17.7.   Lab Results  Component Value Date   PSA 0.17 01/16/2015   PSA CANCELED 12/07/2014   PSA 0.49 09/08/2013   Lab Results  Component Value Date   WBC 4.1 (A) 02/18/2015   HGB 13.8 (A)  02/18/2015   HCT 38.0 (A) 02/18/2015   MCV 91.6 02/18/2015   PLT 249 10/05/2014   Lipid Panel He states he has cholesterol checked at [facility], being 105 in July.   Hypothyroidism He is post op hypothyroidism. He takes levothyroxine 230mcg and has received those injections here. His last TSH was 0.89 at [facility] on July 6th.   Cardiac He has a history of WPW syndrome. He also has history of PVC's. His EF approximately 41%. MRI in 2017 showed severely enlarged left ventricle. Aug 2017, he underwent ablation of anterolateral papillary muscles, done by Dr. Dwana Curd.   In cardiology visit in March 2018, he had discontinued metoprolol and bisoprolol, which improved stamina. On July visit, he tolerated medications well, but still had PVCs on EKG. He restarted metoprolol 25mg  for PVC's and palpitations. Plan for follow up with Dr. Jalene Mullet at [facility] cardiology. He will have repeat MRI in 2020.   He will have echo done next Wednesday (in 6 days). He notes metoprolol causing a lot of knee pain. He saw cardiologist in July as well, and will be place on Holter monitor this coming Wednesday (in 6 days).    Orthopedics He has history of bilateral knee pain. He's also treated for spinal stenosis with posterior cervical decompression for a cervical myelopathy in Aug 2016. He ambulates with a cane. He is followed by orthopedist, Dr. Marvel Plan, at [place] at [facility]. He was having some persistent left C6 symptoms, but declined further  intervention at that point due to symptoms not as severe at that time; thought some symptoms may have been due to spinal cord injury as opposed to carpal tunnel syndrome. Discussed further work up of EMG if symptoms worsens.   He has an appointment next Wednesday (in 6 days).   Falls He states he fell about 3.5 weeks ago when he was going up the stairs. He was seen at [facility] orthopedics and was diagnosed with cracked L1. He has disability paperwork being done at  [employer].   He's been using a 4-prong cane primarily for balance issues. He currently lives in a [place] but looking to move to a single-level home with his disability submitted. He used to have more falls with single prong cane.   He had bone density testing done on July 6th, T-score -1.7 and consistent with osteopenia.   Immunizations Immunization History  Administered Date(s) Administered  . Influenza,inj,Quad PF,6+ Mos 11/02/2014   Flu shot: received today.  Tetanus: updated today.  Pneumonia vaccine: will check with his endocrinology.   Depression Depression screen [facility] 2/9 10/30/2016 04/03/2016 03/20/2016 03/14/2016 11/19/2015  Decreased Interest 0 0 0 0 0  Down, Depressed, Hopeless 0 0 0 0 0  PHQ - 2 Score 0 0 0 0 0   He states he's doing well.   STI screening: He denies screening done.   Vision  Visual Acuity Screening   Right eye Left eye Both eyes  Without correction:  20/30 20/30  With correction:     Comments: Blind in right eye   Dentist He is followed by dentist regularly, seen every 6 months.   Exercise He is limited by orthopedic issues. He uses cane as needed. He is retired from his job at [employer].   Patient Active Problem List   Diagnosis Date Noted  . Traumatic closed fracture of distal clavicle with minimal displacement, left, with routine healing, subsequent encounter 03/24/2016  . Left knee pain 09/25/2015  . Adjustment disorder with depressed mood   . Tetraplegia ([place]) 09/19/2014  . Acute blood loss anemia 09/19/2014  . Cervical spondylosis with myelopathy 09/18/2014  . Radiculopathy of arm 08/28/2014  . Hypogonadism male 09/08/2013  . Acromegaly ([place]) 04/01/2011  . Cardiomyopathy 04/01/2011  . Wolff-Parkinson-White (WPW) syndrome 04/01/2011   Past Medical History:  Diagnosis Date  . Acromegaly ([place])   . Cancer ([place])   . History of pituitary tumor   . Knee pain, bilateral   . Testosterone deficiency   . Thyroid disease     Past Surgical History:  Procedure Laterality Date  . CARDIAC ELECTROPHYSIOLOGY STUDY AND ABLATION    . CERVICAL DISC SURGERY  2007   Anterior C4/5 vertebrectomy and c3-C6 laminoplasty with plating  . CRANIOTOMY FOR TUMOR     pituitary adenoma   Allergies  Allergen Reactions  . Oxycodone     Nausea?   Prior to Admission medications   Medication Sig Start Date End Date Taking? Authorizing Provider  amiodarone (PACERONE) 400 MG tablet Take by mouth. 09/20/15 11/17/15  [provider]  atorvastatin (LIPITOR) 40 MG tablet Take 40 mg by mouth daily.    [provider]  bisoprolol (ZEBETA) 5 MG tablet Take 5 mg by mouth daily.    [provider]  Cholecalciferol (VITAMIN D3) 2000 UNITS capsule Take by mouth.    [provider]  enalapril (VASOTEC) 5 MG tablet Take 5 mg by mouth 2 (two) times daily.     [provider]  ferrous sulfate 325 (65 FE) MG tablet Take 1 tablet (325 mg total) by mouth 2 (two) times daily with a meal. Patient taking differently: Take 325 mg by mouth daily with breakfast.  10/05/14   Love, Ivan Anchors, PA-C  hydrocortisone (CORTEF) 10 MG tablet 15 mg in the morning and 5 mg in the afternoon. Needs 30 extra tabs for sick days 12/24/15   [provider]  levothyroxine (SYNTHROID, LEVOTHROID) 200 MCG tablet Take 1 tablet (200 mcg total) by mouth daily. 10/05/14   Love, Ivan Anchors, PA-C  Menthol-Methyl Salicylate (MUSCLE RUB) 10-15 % CREA Apply 1 application topically as needed for muscle pain. 10/05/14   Love, Ivan Anchors, PA-C  octreotide (SANDOSTATIN LAR) 30 MG injection Inject 1 syringe (30 mg) into the muscle monthy 12/24/15   [provider]  testosterone cypionate (DEPOTESTOSTERONE CYPIONATE) 200 MG/ML injection Inject 1 mL (200 mg total) into the muscle every 14 (fourteen) days. 12/22/15   Wardell Honour, MD  traMADol (ULTRAM) 50 MG tablet Take 1 tablet (50 mg total) by mouth every 8 (eight) hours as needed. Patient  not taking: Reported on 04/03/2016 03/14/16   Jaynee Eagles, PA-C   Social History   Social History  . Marital status: Single    Spouse name: N/A  . Number of children: N/A  . Years of education: N/A   Occupational History  . Not on file.   Social History Main Topics  . Smoking status: Never Smoker  . Smokeless tobacco: Never Used  . Alcohol use No  . Drug use: No  . Sexual activity: Not on file   Other Topics Concern  . Not on file   Social History Narrative  . No narrative on file   Review of Systems 13 point ROS - negative     Objective:   Physical Exam  Constitutional: He is oriented to person, place, and time. He appears well-developed and well-nourished.  HENT:  Head: Normocephalic and atraumatic.  Right Ear: External ear normal.  Left Ear: External ear normal.  Mouth/Throat: Oropharynx is clear and moist.  Left ear: moderate cerumen but not completely obstructed Right ear: minimal cerumen  Eyes: Pupils are equal, round, and reactive to light. Conjunctivae and EOM are normal.  Exotropic right eye, minimal reaction of right eye; normal in left eye  Neck: Normal range of motion. Neck supple. No thyromegaly present.  Cardiovascular: Normal rate, regular rhythm, normal heart sounds and intact distal pulses.   Pulmonary/Chest: Effort normal and breath sounds normal. No respiratory distress. He has no wheezes.  Abdominal: Soft. He exhibits no distension. There is no tenderness.  Musculoskeletal: Normal range of motion. He exhibits no edema or tenderness.  C-spine: Decreased ROM of flexion and extension, about 15-20 degrees of rotation; there is a well healed scar over cervical spine He has AFO brace in place on left leg, no significant lower extremity edema  Lymphadenopathy:    He has no cervical adenopathy.  Neurological: He is alert and oriented to person, place, and time. He has normal reflexes.  Skin: Skin is warm and dry.  Psychiatric: He has a normal mood and  affect. His behavior is normal.  Vitals reviewed.   Vitals:   10/30/16 1345  BP: 120/77  Pulse: 70  Resp: 18  Temp: 97.8 F (36.6 C)  TempSrc: Oral  SpO2: 98%  Weight: 245 lb (111.1 kg)  Height: 6\' 8"  (2.032 m)      Assessment & Plan:   [name]  is a 46 y.o. male Annual physical exam  --anticipatory guidance as below in AVS, screening labs above. Health maintenance items as above in HPI discussed/recommended as applicable.   Need for influenza vaccination - Plan: Flu Vaccine QUAD 36+ mos IM  Hypogonadism in male  - Followed by endocrinologist at [facility]. I did review their notes through care everywhere, but also advised patient on recommended routine screening with testosterone supplementation. Has received testosterone injections through our office, but prescribed by endocrinology  Cardiomyopathy, unspecified type ([place])  -Followed by cardiology, apparently has not been able to tolerate metoprolol due to arthralgias, upcoming Holter monitor, and follow-up with cardiology. No changes at present.  Acromegaly ([place])  -History of pituitary tumor as above. Receives Sandostatin injection monthly, also dosed at our office, but prescribed by his endocrinologist. They are monitoring growth hormone levels with routine follow-up for MRI brain with neurosurgeon.  Hypothyroidism, unspecified type  -Continuing on Synthroid, also monitored by endocrinologist at [facility]  History of anemia  -has pending labs apparently for iron assessment.  Screening for hyperlipidemia  -Deferred labs at present, has ongoing care with cardiology.  Spinal stenosis in cervical region, Balance problem  -Some prior weakness, now uses 4 pronged cane with improved mobility/balance. If disability paperwork needed, recommended he review that with his orthopedist, and neurosurgeon.  -Avoidance of area rugs to lessen risk of falls/tripping.  Pain in both knees, unspecified chronicity  -Followed by orthopedics,  uses 4 pronged cane as above, and discussed if paperwork needed, may need to be reviewed by orthopedic/neurosurgery.  Need for Tdap vaccination - Plan: Tdap vaccine greater than or equal to 7yo IM   Meds ordered this encounter  Medications  . aspirin EC 81 MG tablet    Sig: Take 81 mg by mouth daily.  Marland Kitchen losartan (COZAAR) 25 MG tablet    Sig: Take 25 mg by mouth daily.  Marland Kitchen eplerenone (INSPRA) 50 MG tablet    Sig: Take 50 mg by mouth daily.   Patient Instructions   Keep follow up with cardiologist and orthopaedist as planned.  Make sure you get bloodwork for repeat iron.   For testosterone supplement, I would recommend PSA, blood counts, lipid panel and liver tests.  Let me know if your specialist needs me to order those tests.  Tdap given today. You can ask your specialists if they think you need that vaccine.   I recommend against area rugs to lessen risk of falls. If those increase, would recommend meeting with neurologist.   If needed, we can meet to go through paperwork to see if it something I can complete, or if specialist or therapist needs to evaluate you to complete it.   Follow-up with me in one year for a physical, or sooner if any monitoring tests are needed. Let me know if you have any questions in the meantime.   Keeping you healthy  Get these tests  Blood pressure- Have your blood pressure checked once a year by your healthcare provider.  Normal blood pressure is 120/80.  Weight- Have your body mass index (BMI) calculated to screen for obesity.  BMI is a measure of body fat based on height and weight. You can also calculate your own BMI at [employer]Bags[employer]it.  Cholesterol- Have your cholesterol checked regularly starting at age 38, sooner may be necessary if you have diabetes, high blood pressure, if a family member developed heart diseases at an early age or if you smoke.   Chlamydia, HIV, and other sexual transmitted  disease- Get screened each year until  the age of 23 then within three months of each new sexual partner.  Diabetes- Have your blood sugar checked regularly if you have high blood pressure, high cholesterol, a family history of diabetes or if you are overweight.  Get these vaccines  Flu shot- Every fall.  Tetanus shot- Every 10 years.  Menactra- Single dose; prevents meningitis.  Take these steps  Don't smoke- If you do smoke, ask your healthcare provider about quitting. For tips on how to quit, go to www.smokefree.gov or call 1-800-QUIT-NOW.  Be physically active- Exercise 5 days a week for at least 30 minutes.  If you are not already physically active start slow and gradually work up to 30 minutes of moderate physical activity.  Examples of moderate activity include walking briskly, mowing the yard, dancing, swimming bicycling, etc.  Eat a healthy diet- Eat a variety of healthy foods such as fruits, vegetables, low fat milk, low fat cheese, yogurt, lean meats, poultry, fish, beans, tofu, etc.  For more information on healthy eating, go to www.thenutritionsource.org  Drink alcohol in moderation- Limit alcohol intake two drinks or less a day.  Never drink and drive.  Dentist- Brush and floss teeth twice daily; visit your dentis twice a year.  Depression-Your emotional health is as important as your physical health.  If you're feeling down, losing interest in things you normally enjoy please talk with your healthcare provider.  Gun Safety- If you keep a gun in your home, keep it unloaded and with the safety lock on.  Bullets should be stored separately.  Helmet use- Always wear a helmet when riding a motorcycle, bicycle, rollerblading or skateboarding.  Safe sex- If you may be exposed to a sexually transmitted infection, use a condom  Seat belts- Seat bels can save your life; always wear one.  Smoke/Carbon Monoxide detectors- These detectors need to be installed on the appropriate level of your home.  Replace batteries at  least once a year.  Skin Cancer- When out in the sun, cover up and use sunscreen SPF 15 or higher.  Violence- If anyone is threatening or hurting you, please tell your healthcare provider. IF you received an x-ray today, you will receive an invoice from [facility]. Please contact [facility] at [phone] with questions or concerns regarding your invoice.   IF you received labwork today, you will receive an invoice from [place]. Please contact [facility] at [phone] with questions or concerns regarding your invoice.   Our billing staff will not be able to assist you with questions regarding bills from these companies.  You will be contacted with the lab results as soon as they are available. The fastest way to get your results is to activate your My Chart account. Instructions are located on the last page of this paperwork. If you have not heard from [place] regarding the results in 2 weeks, please contact this office.       I personally performed the services described in this documentation, which was scribed in my presence. The recorded information has been reviewed and considered for accuracy and completeness, addended by me as needed, and agree with information above.  Signed,   [name], MD [facility].  11/03/16 6:05 PM

## 2016-11-03 DIAGNOSIS — D229 Melanocytic nevi, unspecified: Secondary | ICD-10-CM | POA: Diagnosis not present

## 2016-11-03 DIAGNOSIS — D492 Neoplasm of unspecified behavior of bone, soft tissue, and skin: Secondary | ICD-10-CM | POA: Diagnosis not present

## 2016-11-04 ENCOUNTER — Encounter: Payer: Self-pay | Admitting: Emergency Medicine

## 2016-11-04 ENCOUNTER — Ambulatory Visit (INDEPENDENT_AMBULATORY_CARE_PROVIDER_SITE_OTHER): Payer: 59 | Admitting: Emergency Medicine

## 2016-11-04 VITALS — HR 36 | Temp 97.5°F

## 2016-11-04 DIAGNOSIS — E291 Testicular hypofunction: Secondary | ICD-10-CM

## 2016-11-04 MED ORDER — TESTOSTERONE CYPIONATE 200 MG/ML IM SOLN
100.0000 mg | Freq: Once | INTRAMUSCULAR | Status: AC
  Administered 2016-11-04: 100 mg via INTRAMUSCULAR

## 2016-11-04 NOTE — Patient Instructions (Signed)
Pt here for Testosterone injection 0.5 cc given.

## 2016-11-05 ENCOUNTER — Encounter: Payer: Self-pay | Admitting: Family Medicine

## 2016-11-05 DIAGNOSIS — I517 Cardiomegaly: Secondary | ICD-10-CM | POA: Diagnosis not present

## 2016-11-05 DIAGNOSIS — M4712 Other spondylosis with myelopathy, cervical region: Secondary | ICD-10-CM | POA: Diagnosis not present

## 2016-11-05 DIAGNOSIS — M4802 Spinal stenosis, cervical region: Secondary | ICD-10-CM | POA: Diagnosis not present

## 2016-11-05 DIAGNOSIS — I2510 Atherosclerotic heart disease of native coronary artery without angina pectoris: Secondary | ICD-10-CM | POA: Diagnosis not present

## 2016-11-05 DIAGNOSIS — M542 Cervicalgia: Secondary | ICD-10-CM | POA: Diagnosis not present

## 2016-11-05 DIAGNOSIS — I428 Other cardiomyopathies: Secondary | ICD-10-CM | POA: Diagnosis not present

## 2016-11-05 DIAGNOSIS — I340 Nonrheumatic mitral (valve) insufficiency: Secondary | ICD-10-CM | POA: Diagnosis not present

## 2016-11-05 DIAGNOSIS — G629 Polyneuropathy, unspecified: Secondary | ICD-10-CM | POA: Diagnosis not present

## 2016-11-05 DIAGNOSIS — I5189 Other ill-defined heart diseases: Secondary | ICD-10-CM | POA: Diagnosis not present

## 2016-11-07 NOTE — Progress Notes (Signed)
Here for testosterone injection

## 2016-11-13 DIAGNOSIS — I493 Ventricular premature depolarization: Secondary | ICD-10-CM | POA: Diagnosis not present

## 2016-11-20 ENCOUNTER — Encounter: Payer: Self-pay | Admitting: Physician Assistant

## 2016-11-20 ENCOUNTER — Ambulatory Visit (INDEPENDENT_AMBULATORY_CARE_PROVIDER_SITE_OTHER): Payer: BLUE CROSS/BLUE SHIELD | Admitting: Physician Assistant

## 2016-11-20 VITALS — BP 120/76 | HR 47 | Temp 98.1°F

## 2016-11-20 DIAGNOSIS — E220 Acromegaly and pituitary gigantism: Secondary | ICD-10-CM

## 2016-11-20 DIAGNOSIS — E291 Testicular hypofunction: Secondary | ICD-10-CM | POA: Diagnosis not present

## 2016-11-20 MED ORDER — TESTOSTERONE CYPIONATE 200 MG/ML IM SOLN
100.0000 mg | Freq: Once | INTRAMUSCULAR | Status: AC
  Administered 2016-11-20: 100 mg via INTRAMUSCULAR

## 2016-11-20 MED ORDER — OCTREOTIDE ACETATE 30 MG IM KIT
30.0000 mg | PACK | Freq: Once | INTRAMUSCULAR | Status: AC
  Administered 2016-11-20: 30 mg via INTRAMUSCULAR

## 2016-11-20 NOTE — Patient Instructions (Signed)
Pt here for Testosterone injection 0.5 ml and Sandostatin 30 mg.

## 2016-11-20 NOTE — Progress Notes (Signed)
I did not personally examine this patient.

## 2016-11-24 DIAGNOSIS — M545 Low back pain: Secondary | ICD-10-CM | POA: Diagnosis not present

## 2016-11-30 DIAGNOSIS — I493 Ventricular premature depolarization: Secondary | ICD-10-CM | POA: Diagnosis not present

## 2016-11-30 DIAGNOSIS — Z885 Allergy status to narcotic agent status: Secondary | ICD-10-CM | POA: Diagnosis not present

## 2016-11-30 DIAGNOSIS — M48061 Spinal stenosis, lumbar region without neurogenic claudication: Secondary | ICD-10-CM | POA: Diagnosis not present

## 2016-11-30 DIAGNOSIS — S22089A Unspecified fracture of T11-T12 vertebra, initial encounter for closed fracture: Secondary | ICD-10-CM | POA: Diagnosis not present

## 2016-11-30 DIAGNOSIS — Z981 Arthrodesis status: Secondary | ICD-10-CM | POA: Diagnosis not present

## 2016-11-30 DIAGNOSIS — R197 Diarrhea, unspecified: Secondary | ICD-10-CM | POA: Diagnosis not present

## 2016-11-30 DIAGNOSIS — M47814 Spondylosis without myelopathy or radiculopathy, thoracic region: Secondary | ICD-10-CM | POA: Diagnosis not present

## 2016-11-30 DIAGNOSIS — E220 Acromegaly and pituitary gigantism: Secondary | ICD-10-CM | POA: Diagnosis not present

## 2016-11-30 DIAGNOSIS — M47817 Spondylosis without myelopathy or radiculopathy, lumbosacral region: Secondary | ICD-10-CM | POA: Diagnosis not present

## 2016-11-30 DIAGNOSIS — Z7982 Long term (current) use of aspirin: Secondary | ICD-10-CM | POA: Diagnosis not present

## 2016-11-30 DIAGNOSIS — M4854XA Collapsed vertebra, not elsewhere classified, thoracic region, initial encounter for fracture: Secondary | ICD-10-CM | POA: Diagnosis not present

## 2016-11-30 DIAGNOSIS — E039 Hypothyroidism, unspecified: Secondary | ICD-10-CM | POA: Diagnosis not present

## 2016-11-30 DIAGNOSIS — R29898 Other symptoms and signs involving the musculoskeletal system: Secondary | ICD-10-CM | POA: Diagnosis not present

## 2016-11-30 DIAGNOSIS — M4804 Spinal stenosis, thoracic region: Secondary | ICD-10-CM | POA: Diagnosis not present

## 2016-11-30 DIAGNOSIS — M545 Low back pain: Secondary | ICD-10-CM | POA: Diagnosis not present

## 2016-11-30 DIAGNOSIS — K5900 Constipation, unspecified: Secondary | ICD-10-CM | POA: Diagnosis not present

## 2016-11-30 DIAGNOSIS — M5126 Other intervertebral disc displacement, lumbar region: Secondary | ICD-10-CM | POA: Diagnosis not present

## 2016-11-30 DIAGNOSIS — G8929 Other chronic pain: Secondary | ICD-10-CM | POA: Diagnosis not present

## 2016-11-30 DIAGNOSIS — E785 Hyperlipidemia, unspecified: Secondary | ICD-10-CM | POA: Diagnosis not present

## 2016-11-30 DIAGNOSIS — Z9181 History of falling: Secondary | ICD-10-CM | POA: Diagnosis not present

## 2016-11-30 DIAGNOSIS — Z79899 Other long term (current) drug therapy: Secondary | ICD-10-CM | POA: Diagnosis not present

## 2016-11-30 DIAGNOSIS — R339 Retention of urine, unspecified: Secondary | ICD-10-CM | POA: Diagnosis not present

## 2016-12-01 DIAGNOSIS — R279 Unspecified lack of coordination: Secondary | ICD-10-CM | POA: Diagnosis not present

## 2016-12-01 DIAGNOSIS — M6281 Muscle weakness (generalized): Secondary | ICD-10-CM | POA: Diagnosis not present

## 2016-12-01 DIAGNOSIS — K5900 Constipation, unspecified: Secondary | ICD-10-CM | POA: Diagnosis not present

## 2016-12-01 DIAGNOSIS — M48061 Spinal stenosis, lumbar region without neurogenic claudication: Secondary | ICD-10-CM | POA: Diagnosis not present

## 2016-12-01 DIAGNOSIS — M4854XA Collapsed vertebra, not elsewhere classified, thoracic region, initial encounter for fracture: Secondary | ICD-10-CM | POA: Diagnosis not present

## 2016-12-01 DIAGNOSIS — R339 Retention of urine, unspecified: Secondary | ICD-10-CM | POA: Diagnosis not present

## 2016-12-02 DIAGNOSIS — I428 Other cardiomyopathies: Secondary | ICD-10-CM | POA: Diagnosis not present

## 2016-12-02 DIAGNOSIS — Z7982 Long term (current) use of aspirin: Secondary | ICD-10-CM | POA: Diagnosis not present

## 2016-12-02 DIAGNOSIS — Z981 Arthrodesis status: Secondary | ICD-10-CM | POA: Diagnosis not present

## 2016-12-02 DIAGNOSIS — M4712 Other spondylosis with myelopathy, cervical region: Secondary | ICD-10-CM | POA: Diagnosis not present

## 2016-12-02 DIAGNOSIS — M4854XA Collapsed vertebra, not elsewhere classified, thoracic region, initial encounter for fracture: Secondary | ICD-10-CM | POA: Diagnosis not present

## 2016-12-02 DIAGNOSIS — M4804 Spinal stenosis, thoracic region: Secondary | ICD-10-CM | POA: Diagnosis not present

## 2016-12-02 DIAGNOSIS — M47816 Spondylosis without myelopathy or radiculopathy, lumbar region: Secondary | ICD-10-CM | POA: Diagnosis not present

## 2016-12-02 DIAGNOSIS — Y33XXXA Other specified events, undetermined intent, initial encounter: Secondary | ICD-10-CM | POA: Diagnosis not present

## 2016-12-02 DIAGNOSIS — R339 Retention of urine, unspecified: Secondary | ICD-10-CM | POA: Diagnosis not present

## 2016-12-02 DIAGNOSIS — G9589 Other specified diseases of spinal cord: Secondary | ICD-10-CM | POA: Diagnosis not present

## 2016-12-02 DIAGNOSIS — Z79899 Other long term (current) drug therapy: Secondary | ICD-10-CM | POA: Diagnosis not present

## 2016-12-02 DIAGNOSIS — S22080A Wedge compression fracture of T11-T12 vertebra, initial encounter for closed fracture: Secondary | ICD-10-CM | POA: Diagnosis not present

## 2016-12-02 DIAGNOSIS — E220 Acromegaly and pituitary gigantism: Secondary | ICD-10-CM | POA: Diagnosis not present

## 2016-12-02 DIAGNOSIS — M48061 Spinal stenosis, lumbar region without neurogenic claudication: Secondary | ICD-10-CM | POA: Diagnosis not present

## 2016-12-02 DIAGNOSIS — M4803 Spinal stenosis, cervicothoracic region: Secondary | ICD-10-CM | POA: Diagnosis not present

## 2016-12-02 DIAGNOSIS — M5126 Other intervertebral disc displacement, lumbar region: Secondary | ICD-10-CM | POA: Diagnosis not present

## 2016-12-05 ENCOUNTER — Ambulatory Visit (INDEPENDENT_AMBULATORY_CARE_PROVIDER_SITE_OTHER): Payer: BLUE CROSS/BLUE SHIELD | Admitting: Emergency Medicine

## 2016-12-05 DIAGNOSIS — E291 Testicular hypofunction: Secondary | ICD-10-CM | POA: Diagnosis not present

## 2016-12-05 MED ORDER — TESTOSTERONE CYPIONATE 200 MG/ML IM SOLN
200.0000 mg | INTRAMUSCULAR | Status: DC
  Administered 2016-12-05 – 2018-09-08 (×10): 200 mg via INTRAMUSCULAR

## 2016-12-05 NOTE — Progress Notes (Signed)
Patient here for testosterone injection of 0.5 ml only.

## 2016-12-10 ENCOUNTER — Ambulatory Visit: Payer: BLUE CROSS/BLUE SHIELD | Admitting: Emergency Medicine

## 2016-12-10 ENCOUNTER — Ambulatory Visit: Payer: BLUE CROSS/BLUE SHIELD | Admitting: Physician Assistant

## 2016-12-13 ENCOUNTER — Encounter: Payer: Self-pay | Admitting: Family Medicine

## 2016-12-13 ENCOUNTER — Ambulatory Visit: Payer: BLUE CROSS/BLUE SHIELD | Admitting: Family Medicine

## 2016-12-13 ENCOUNTER — Other Ambulatory Visit: Payer: Self-pay

## 2016-12-13 VITALS — BP 130/72 | HR 41 | Temp 98.4°F | Resp 16 | Ht 78.74 in | Wt 248.0 lb

## 2016-12-13 DIAGNOSIS — K5900 Constipation, unspecified: Secondary | ICD-10-CM

## 2016-12-13 MED ORDER — GLYCERIN (ADULT) 2 G RE SUPP: 1.0000 | RECTAL | 1 refills | Status: DC | PRN

## 2016-12-13 MED ORDER — SENNA 8.6 MG PO TABS: 2.0000 | ORAL_TABLET | Freq: Every evening | ORAL | 0 refills | Status: DC | PRN

## 2016-12-13 MED ORDER — LACTULOSE 20 GM/30ML PO SOLN: 30.0000 mL | Freq: Two times a day (BID) | ORAL | 1 refills | Status: DC

## 2016-12-13 NOTE — Patient Instructions (Addendum)
Start the lactulose 30 mL (2 tablespoons/dose) twice a day. Use a glycerin suppository tonight or tomorrow morning as well. If you don't have a bowel movement in 24 hours, ADD IN SENNA - (OVER THE COUNTER) 2 tabs at night before bed and again the next moment. If still no results, come back to see [place] again on Monday.  IF you received an x-ray today, you will receive an invoice from [facility]. Please contact [facility] at [phone] with questions or concerns regarding your invoice.   IF you received labwork today, you will receive an invoice from [facility]. Please contact [facility] at [phone] with questions or concerns regarding your invoice.   Our billing staff will not be able to assist you with questions regarding bills from these companies.  You will be contacted with the lab results as soon as they are available. The fastest way to get your results is to activate your My Chart account. Instructions are located on the last page of this paperwork. If you have not heard from [place] regarding the results in 2 weeks, please contact this office.      Constipation, Adult Constipation is when a person has fewer bowel movements in a week than normal, has difficulty having a bowel movement, or has stools that are dry, hard, or larger than normal. Constipation may be caused by an underlying condition. It may become worse with age if a person takes certain medicines and does not take in enough fluids. Follow these instructions at home: Eating and drinking   Eat foods that have a lot of fiber, such as fresh fruits and vegetables, whole grains, and beans.  Limit foods that are high in fat, low in fiber, or overly processed, such as french fries, hamburgers, cookies, candies, and soda.  Drink enough fluid to keep your urine clear or pale yellow. General instructions  Exercise regularly or as told by your health care provider.  Go to the restroom when you have the urge to go.  Do not hold it in.  Take over-the-counter and prescription medicines only as told by your health care provider. These include any fiber supplements.  Practice pelvic floor retraining exercises, such as deep breathing while relaxing the lower abdomen and pelvic floor relaxation during bowel movements.  Watch your condition for any changes.  Keep all follow-up visits as told by your health care provider. This is important. Contact a health care provider if:  You have pain that gets worse.  You have a fever.  You do not have a bowel movement after 4 days.  You vomit.  You are not hungry.  You lose weight.  You are bleeding from the anus.  You have thin, pencil-like stools. Get help right away if:  You have a fever and your symptoms suddenly get worse.  You leak stool or have blood in your stool.  Your abdomen is bloated.  You have severe pain in your abdomen.  You feel dizzy or you faint. This information is not intended to replace advice given to you by your health care provider. Make sure you discuss any questions you have with your health care provider. Document Released: 10/05/2003 Document Revised: 07/27/2015 Document Reviewed: 06/27/2015 Elsevier Interactive Patient Education  2017 [employer].   About Constipation  Constipation Overview Constipation is the most common gastrointestinal complaint - about 4 million Americans experience constipation and make 2.5 million physician visits a year to get help for the problem.  Constipation can occur when the colon absorbs  too much water, the colon's muscle contraction is slow or sluggish, and/or there is delayed transit time through the colon.  The result is stool that is hard and dry.  Indicators of constipation include straining during bowel movements greater than 25% of the time, having fewer than three bowel movements per week, and/or the feeling of incomplete evacuation.  There are established guidelines ([place] II )  for defining constipation. A person needs to have two or more of the following symptoms for at least 12 weeks (not necessarily consecutive) in the preceding 12 months: . Straining in  greater than 25% of bowel movements . Lumpy or hard stools in greater than 25% of bowel movements . Sensation of incomplete emptying in greater than 25% of bowel movements . Sensation of anorectal obstruction/blockade in greater than 25% of bowel movements . Manual maneuvers to help empty greater than 25% of bowel movements (e.g., digital evacuation, support of the pelvic floor)  . Less than  3 bowel movements/week . Loose stools are not present, and criteria for irritable bowel syndrome are insufficient  Common Causes of Constipation . Lack of fiber in your diet . Lack of physical activity . Medications, including iron and calcium supplements  . Dairy intake . Dehydration . Abuse of laxatives  Travel  Irritable Bowel Syndrome  Pregnancy  Luteal phase of menstruation (after ovulation and before menses)  Colorectal problems  Intestinal Dysfunction  Treating Constipation  There are several ways of treating constipation, including changes to diet and exercise, use of laxatives, adjustments to the pelvic floor, and scheduled toileting.  These treatments include: . increasing fiber and fluids in the diet  . increasing physical activity . learning muscle coordination   learning proper toileting techniques and toileting modifications   designing and sticking  to a toileting schedule     2007, [employer] Doc.22

## 2016-12-13 NOTE — Progress Notes (Signed)
Subjective:  By signing my name below, I, Mark Gallagher, attest that this documentation has been prepared under the direction and in the presence of Delman Cheadle, MD. Electronically Signed: Moises Gallagher, [place]. 12/13/2016 , 4:27 PM .  Patient was seen in Room 1 .   Patient ID: Mark Gallagher, male    DOB: 04/04/1970, 46 y.o.   MRN: [id number] Chief Complaint  Patient presents with  . Constipation    x 3 days    HPI Mark Gallagher is a 46 y.o. male who presents to Primary Care at [facility] complaining of constipation for 3 days.   Patient informs being in [place], [place] about 2 weeks ago, and was able to pass bowel movement without any problems. However, a few hours later, he suddenly had abdominal pain with difficulty urinating and constipation. An ambulance was called today, and was taken to [facility] he had abdominal MRI done which showed narrowing of spine, and was recommended surgery. He informed them that most of his records are at [facility], so he took another ambulance ride to [place]. He had another MRI done, also noting narrowing of spine, but was informed that he doesn't need surgery. Since then, he's been having constipation; his difficulty urinating improved.   His last BM was 3 days ago. He mentions intermittent constipation, but not recurrent like this. He notes a lot of gas passing. He informs he was taking tramadol until 6 days ago for the abdominal pain. He's also taken iron supplements, but for a while now. He's been eating apples, oatmeal, and raisin bran. He was taking magnesium citrate but didn't want to take too much of it, as he's read that it'll drop his electrolytes; last taken about a week ago. He's also been taking miralax twice a day, 3-4 doses a day total. He's been using [employer]-brand stool softener. He's been trying to walk some. He denies urination problems. He's drinking about 8 cups of water daily; denies appetite loss. He denies nausea, vomiting, saddle anesthesia,  or black tarry stools. He's used enemas in the past. He isn't followed by a GI.   Past Medical History:  Diagnosis Date  . 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 Cancer ([place])   . History of pituitary tumor   . Knee pain, bilateral   . Testosterone deficiency   . Thyroid disease    Prior to Admission medications   Medication Sig Start Date End Date Taking? Authorizing Provider  amiodarone (PACERONE) 400 MG tablet Take by mouth. 09/20/15 11/17/15  [provider]  aspirin EC 81 MG tablet Take 81 mg by mouth daily.    [provider]  bisoprolol (ZEBETA) 5 MG tablet Take 5 mg by mouth daily.    [provider]  Cholecalciferol (VITAMIN D3) 2000 UNITS capsule Take by mouth.    [provider]  enalapril (VASOTEC) 5 MG tablet Take 5 mg by mouth 2 (two) times daily.     [provider]  eplerenone (INSPRA) 50 MG tablet Take 50 mg by mouth daily.    [provider]  ferrous sulfate 325 (65 FE) MG tablet Take 1 tablet (325 mg total) by mouth 2 (two) times daily with a meal. Patient taking differently: Take 325 mg by mouth daily with breakfast.  10/05/14   Love, Ivan Anchors, PA-C  hydrocortisone (CORTEF) 10 MG tablet 15 mg in the morning and 5 mg in the afternoon. Needs 30 extra tabs for sick days 12/24/15   [provider]  levothyroxine (SYNTHROID, LEVOTHROID) 200 MCG tablet Take 1 tablet (200 mcg total) by mouth daily. 10/05/14   Love, Ivan Anchors, PA-C  losartan (COZAAR) 25 MG tablet Take 25 mg by mouth daily.    [provider]  Menthol-Methyl Salicylate (MUSCLE RUB) 10-15 % CREA Apply 1 application topically as needed for muscle pain. 10/05/14   Love, Ivan Anchors, PA-C  octreotide (SANDOSTATIN LAR) 30 MG injection Inject 1 syringe (30 mg) into the muscle monthy 12/24/15   [provider]  testosterone cypionate (DEPOTESTOSTERONE CYPIONATE) 200 MG/ML injection Inject 1 mL (200 mg total) into the muscle every 14 (fourteen) days. 12/22/15   Wardell Honour, MD   Allergies  Allergen Reactions  . Oxycodone     Nausea?   Past Surgical History:  Procedure Laterality Date  . CARDIAC ELECTROPHYSIOLOGY STUDY AND ABLATION    . CERVICAL DISC SURGERY  2007   Anterior C4/5 vertebrectomy and c3-C6 laminoplasty with plating  . CRANIOTOMY FOR TUMOR     pituitary adenoma   Family History  Problem Relation Age of Onset  . High Gallagher pressure Mother   . Acute myelogenous leukemia Brother    Social History   Socioeconomic History  . Marital status: Single    Spouse name: None  . Number of children: None  . Years of education: None  . Highest education level: None  Social Needs  . Financial resource strain: None  . Food insecurity - worry: None  . Food insecurity - inability: None  . Transportation needs - medical: None  . Transportation needs - non-medical: None  Occupational History  . None  Tobacco Use  . Smoking status: Never Smoker  . Smokeless tobacco: Never Used  Substance and Sexual Activity  . Alcohol use: No  . Drug use: No  . Sexual activity: None  Other Topics Concern  . None  Social History Narrative  . None   Depression screen [facility] 2/9 12/15/2016 12/13/2016 10/30/2016 04/03/2016 03/20/2016  Decreased Interest 0 0 0 0 0  Down, Depressed, Hopeless 0 0 0 0 0  PHQ - 2 Score 0 0 0 0 0    Review of Systems  Constitutional: Negative for appetite change, fatigue, fever and unexpected weight change.  Eyes: Negative for visual disturbance.  Respiratory: Negative for cough, chest tightness and shortness of breath.   Cardiovascular: Negative for chest pain, palpitations and leg swelling.  Gastrointestinal: Positive for constipation. Negative for abdominal pain, Gallagher in stool, nausea and vomiting.  Genitourinary: Negative for difficulty urinating.  Neurological: Negative for dizziness, light-headedness and headaches.       Objective:   Physical Exam  Constitutional: He is oriented to person, place, and time. He  appears well-developed and well-nourished. No distress.  HENT:  Head: Normocephalic and atraumatic.  Eyes: EOM are normal. Pupils are equal, round, and reactive to light.  Neck: Neck supple.  Cardiovascular: Normal rate.  Pulmonary/Chest: Effort normal. No respiratory distress.  Abdominal: Soft. Bowel sounds are normal. There is no hepatosplenomegaly. There is no tenderness.  Tympanitic bowel sounds  Musculoskeletal: Normal range of motion.  Neurological: He is alert and oriented to person, place, and time.  Skin: Skin is warm and dry.  Psychiatric: He has a normal mood and affect. His behavior is normal.  Nursing note and vitals reviewed.   BP 130/72   Pulse (!) 41   Temp 98.4 F (36.9 C) (Oral)   Resp 16   Ht 6' 6.74" (2 m)   Wt 248  lb (112.5 kg)   SpO2 97%   BMI 28.12 kg/m      Assessment & Plan:   1. Constipation, unspecified constipation type   stop stool softener and miralax. Start lactulose and try glycerine suppository if any signs of outlet dysfxn. If sxs not resolved within 24 hrs, add in senna. If sxs cont for 48 hrs, recheck in [place].  Meds ordered this encounter  Medications  . Lactulose 20 GM/30ML SOLN    Sig: Take 30 mLs (20 g total) by mouth 2 (two) times daily.    Dispense:  473 mL    Refill:  1  . glycerin adult 2 g suppository    Sig: Place 1 suppository rectally as needed for constipation.    Dispense:  12 suppository    Refill:  1  . senna (SENOKOT) 8.6 MG TABS tablet    Sig: Take 2 tablets (17.2 mg total) by mouth at bedtime as needed for mild constipation.    Dispense:  60 each    Refill:  0    I personally performed the services described in this documentation, which was scribed in my presence. The recorded information has been reviewed and considered, and addended by me as needed.   Delman Cheadle, M.D.  Primary Care at [facility] [address] [place], [place] [address] [phone] phone [phone] fax  12/15/16 4:42 PM

## 2016-12-15 ENCOUNTER — Ambulatory Visit: Payer: BLUE CROSS/BLUE SHIELD | Admitting: Family Medicine

## 2016-12-15 ENCOUNTER — Encounter: Payer: Self-pay | Admitting: Family Medicine

## 2016-12-15 ENCOUNTER — Ambulatory Visit (INDEPENDENT_AMBULATORY_CARE_PROVIDER_SITE_OTHER): Payer: BLUE CROSS/BLUE SHIELD

## 2016-12-15 VITALS — BP 112/70 | HR 41 | Temp 97.7°F | Resp 16 | Ht 78.74 in | Wt 248.0 lb

## 2016-12-15 DIAGNOSIS — K5900 Constipation, unspecified: Secondary | ICD-10-CM

## 2016-12-15 LAB — POC HEMOCCULT BLD/STL (OFFICE/1-CARD/DIAGNOSTIC): Fecal Occult Blood, POC: NEGATIVE
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
## 2016-12-15 NOTE — Patient Instructions (Addendum)
Drink lots of water and juice, especially grape her prune or apple juice.  Stop the iron for 2 - 4 weeks  Avoid tramadol. When necessary use Tylenol 500 mg 2 pills 3 times daily as needed for pain  Try to stay as active as possible  Continue the MiraLAX and lactulose and senna and stool softener.  Take mag citrate, repeat daily for 3 days if needed.  If not working use a fleets enema once or twice daily as needed.   Once you are opened up start decreasing meds, with continuing the stool softener and MiraLAX until last.    Referral is being made to the gastroenterologist.  In the event of severe abdominal pains go to ER IF you received an x-ray today, you will receive an invoice from [facility]. Please contact [facility] at [phone] with questions or concerns regarding your invoice.   IF you received labwork today, you will receive an invoice from [facility]. Please contact [facility] at [phone] with questions or concerns regarding your invoice.   Our billing staff will not be able to assist you with questions regarding bills from these companies.  You will be contacted with the lab results as soon as they are available. The fastest way to get your results is to activate your My Chart account. Instructions are located on the last page of this paperwork. If you have not heard from [place] regarding the results in 2 weeks, please contact this office.

## 2016-12-16 ENCOUNTER — Ambulatory Visit: Payer: Self-pay | Admitting: *Deleted

## 2016-12-16 NOTE — Telephone Encounter (Signed)
Please see below.

## 2016-12-16 NOTE — Telephone Encounter (Signed)
Patient is calling to see if he should go to the ED- he was seen at Urgent Care and given medications to treat his constipation. He has since had 4 small BM- pebbles with diarrhea surrounding- since last night. He does fill fullness at the rectum and he did use an enema with some success. Encouraged continued treatment and home care- if no better in 24 hours- then may want to reassess and be seen again- hopefully with continued treatment bowels will continue to move. Reason for Disposition . [1] Uses enema or laxative (e.g., lactulose, milk of magnesia) AND [2] > once a month  Answer Assessment - Initial Assessment Questions 1. STOOL PATTERN OR FREQUENCY: "How often do you pass bowel movements (BMs)?"  (Normal range: tid to q 3 days)  "When was the last BM passed?"       Patient has been constipated for 6 days- did start BM last night- very small 2. STRAINING: "Do you have to strain to have a BM?"      No straining- does feel pressure of feces at rectum 3. RECTAL PAIN: "Does your rectum hurt when the stool comes out?" If so, ask: "Do you have hemorrhoids? How bad is the pain?"  (Scale 1-10; or mild, moderate, severe)     Concerned about size of pellets passed 4. STOOL COMPOSITION: "Are the stools hard?"      Hard pellets with diarrhea  5. BLOOD ON STOOLS: "Has there been any blood on the toilet tissue or on the surface of the BM?" If so, ask: "When was the last time?"      None reported 6. CHRONIC CONSTIPATION: "Is this a new problem for you?"  If no, ask: How long have you had this problem?" (days, weeks, months)      No- medication related 7. CHANGES IN DIET: "Have there been any recent changes in your diet?"      no 8. MEDICATIONS: "Have you been taking any new medications?"     Mira lax, suppository, enema 9. LAXATIVES: "Have you been using any laxatives or enemas?"  If yes, ask "What, how often, and when was the last time?"     Yes- as directed- patient has been seen at Urgent care  10.  CAUSE: "What do you think is causing the constipation?"        Medication- which patient has stopped using 11. OTHER SYMPTOMS: "Do you have any other symptoms?" (e.g., abdominal pain, fever, vomiting)       no 12. PREGNANCY: "Is there any chance you are pregnant?" "When was your last menstrual period?"       n/a  Protocols used: CONSTIPATION-A-AH

## 2016-12-17 NOTE — Telephone Encounter (Signed)
Agree with prior instructions and follow up plan if not improving.

## 2016-12-19 ENCOUNTER — Ambulatory Visit (INDEPENDENT_AMBULATORY_CARE_PROVIDER_SITE_OTHER): Payer: BLUE CROSS/BLUE SHIELD | Admitting: Physician Assistant

## 2016-12-19 ENCOUNTER — Encounter: Payer: Self-pay | Admitting: Physician Assistant

## 2016-12-19 VITALS — BP 115/81 | HR 72 | Temp 98.3°F

## 2016-12-19 DIAGNOSIS — E220 Acromegaly and pituitary gigantism: Secondary | ICD-10-CM

## 2016-12-19 DIAGNOSIS — E291 Testicular hypofunction: Secondary | ICD-10-CM

## 2016-12-19 MED ORDER — TESTOSTERONE CYPIONATE 200 MG/ML IM SOLN
100.0000 mg | Freq: Once | INTRAMUSCULAR | Status: AC
Start: 2016-12-19 — End: 2016-12-19
  Administered 2016-12-19: 100 mg via INTRAMUSCULAR

## 2016-12-19 MED ORDER — OCTREOTIDE ACETATE 30 MG IM KIT
30.0000 mg | PACK | Freq: Once | INTRAMUSCULAR | Status: AC
  Administered 2016-12-19: 30 mg via INTRAMUSCULAR

## 2016-12-19 NOTE — Addendum Note (Signed)
Addended by: Lanna Poche R on: 12/19/2016 02:28 PM   Modules accepted: Level of Service

## 2016-12-19 NOTE — Progress Notes (Signed)
Pt here for fast track medication injections. I did not see pt.

## 2016-12-22 DIAGNOSIS — M545 Low back pain: Secondary | ICD-10-CM | POA: Diagnosis not present

## 2016-12-23 ENCOUNTER — Ambulatory Visit: Payer: Self-pay | Admitting: *Deleted
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
## 2016-12-23 NOTE — Telephone Encounter (Signed)
Chart  Reviewed  Dr hoppers  Note  Stated  Stop  The  Iron  For  2-4  Weeks  Pt  Advised

## 2017-01-02 ENCOUNTER — Ambulatory Visit (INDEPENDENT_AMBULATORY_CARE_PROVIDER_SITE_OTHER): Payer: BLUE CROSS/BLUE SHIELD | Admitting: Emergency Medicine

## 2017-01-02 DIAGNOSIS — E291 Testicular hypofunction: Secondary | ICD-10-CM

## 2017-01-02 NOTE — Progress Notes (Signed)
Patient here for testosterone 0.5 mg injection only, injection in right gluteal muscle.

## 2017-01-05 DIAGNOSIS — K5900 Constipation, unspecified: Secondary | ICD-10-CM | POA: Diagnosis not present

## 2017-01-05 DIAGNOSIS — D509 Iron deficiency anemia, unspecified: Secondary | ICD-10-CM | POA: Diagnosis not present

## 2017-01-11 ENCOUNTER — Other Ambulatory Visit: Payer: Self-pay | Admitting: Family Medicine

## 2017-01-12 ENCOUNTER — Encounter: Payer: Self-pay | Admitting: Family Medicine

## 2017-01-12 ENCOUNTER — Other Ambulatory Visit: Payer: Self-pay

## 2017-01-12 ENCOUNTER — Ambulatory Visit: Payer: BLUE CROSS/BLUE SHIELD | Admitting: Family Medicine

## 2017-01-12 VITALS — BP 117/78 | HR 83 | Temp 98.2°F | Resp 16 | Ht 78.0 in | Wt 239.0 lb

## 2017-01-12 DIAGNOSIS — K5900 Constipation, unspecified: Secondary | ICD-10-CM | POA: Diagnosis not present

## 2017-01-12 DIAGNOSIS — J320 Chronic maxillary sinusitis: Secondary | ICD-10-CM | POA: Diagnosis not present

## 2017-01-12 MED ORDER — AMOXICILLIN-POT CLAVULANATE 875-125 MG PO TABS: 1.0000 | ORAL_TABLET | Freq: Two times a day (BID) | ORAL | 0 refills | Status: DC

## 2017-01-12 MED ORDER — LACTULOSE 20 GM/30ML PO SOLN: 30.0000 mL | Freq: Two times a day (BID) | ORAL | 3 refills | Status: DC

## 2017-01-12 NOTE — Patient Instructions (Addendum)
Drink plenty of fluids to stay well-hydrated  Take the benzonatate cough pills 1 or 2 pills 3 times daily if needed for cough.  Can take a DM-containing cough syrup in addition to that if necessary from over-the-counter.  Take the Augmentin 875 mg (amoxicillin/clavulanate) 1 pill twice daily for infection  Continue using the lactulose as needed for your bowels.  Get some over-the-counter Debrox to used to try and get the wax to come out of the ears.  Return as necessary    IF you received an x-ray today, you will receive an invoice from [facility]. Please contact [facility] at [phone] with questions or concerns regarding your invoice.   IF you received labwork today, you will receive an invoice from [place]. Please contact [facility] at [phone] with questions or concerns regarding your invoice.   Our billing staff will not be able to assist you with questions regarding bills from these companies.  You will be contacted with the lab results as soon as they are available. The fastest way to get your results is to activate your My Chart account. Instructions are located on the last page of this paperwork. If you have not heard from [place] regarding the results in 2 weeks, please contact this office.

## 2017-01-12 NOTE — Progress Notes (Signed)
Patient ID: Mark Gallagher, male    DOB: [birth year]  Age: 46 y.o. MRN: [id number]  Chief Complaint  Patient presents with  . Sinusitis    x friday  . Sore Throat    x friday  . Cough    x friday  . Medication Refill    lactulose    Subjective:   46 year old man who has been here several times of late.  I last saw him a month ago with bowel issues, but they are doing better.  He is seen the gastroenterologist, Dr. Benson Gallagher, since then and is to continue using the lactulose and other things.  He is about out of the lactulose and needs a refill of that.  The main reason he came in today however is because of his respiratory infection.  He has had sinus congestion since Friday with a sore throat and cough.  He feels a little ill though not extremely ill.  He may have picked up something when ears with his niece's birthday Mark Gallagher last week.  Current allergies, medications, problem list, past/family and social histories reviewed.  Objective:  BP 117/78   Pulse 83   Temp 98.2 F (36.8 C) (Oral)   Resp 16   Ht 6\' 6"  (1.981 m)   Wt 239 lb (108.4 kg)   SpO2 99%   BMI 27.62 kg/m   No major distress.  TMs difficult to visualize well.  He has a good deal of wax in both ears.  Nose clear.  Throat clear.  Neck supple without significant nodes.  Chest clear to auscultation.  Heart regular.  Assessment & Plan:   Assessment: No diagnosis found.    Plan: See instructions.  No orders of the defined types were placed in this encounter.   No orders of the defined types were placed in this encounter.        Patient Instructions   Drink plenty of fluids to stay well-hydrated  Take the benzonatate cough pills 1 or 2 pills 3 times daily if needed for cough.  Can take a DM-containing cough syrup in addition to that if necessary from over-the-counter.  Take the Augmentin 875 mg (amoxicillin/clavulanate) 1 pill twice daily for infection  Continue using the lactulose as needed for  your bowels.  Get some over-the-counter Debrox to used to try and get the wax to come out of the ears.  Return as necessary    IF you received an x-ray today, you will receive an invoice from [facility]. Please contact [facility] at [phone] with questions or concerns regarding your invoice.   IF you received labwork today, you will receive an invoice from [place]. Please contact [facility] at [phone] with questions or concerns regarding your invoice.   Our billing staff will not be able to assist you with questions regarding bills from these companies.  You will be contacted with the lab results as soon as they are available. The fastest way to get your results is to activate your My Chart account. Instructions are located on the last page of this paperwork. If you have not heard from [place] regarding the results in 2 weeks, please contact this office.        Return if symptoms worsen or fail to improve.   Mark Matthew, MD 01/12/2017

## 2017-01-17 ENCOUNTER — Ambulatory Visit (INDEPENDENT_AMBULATORY_CARE_PROVIDER_SITE_OTHER): Payer: BLUE CROSS/BLUE SHIELD | Admitting: Physician Assistant

## 2017-01-17 ENCOUNTER — Encounter: Payer: Self-pay | Admitting: Physician Assistant

## 2017-01-17 VITALS — BP 118/67 | HR 41 | Temp 98.3°F | Wt 243.0 lb

## 2017-01-17 DIAGNOSIS — E220 Acromegaly and pituitary gigantism: Secondary | ICD-10-CM

## 2017-01-17 DIAGNOSIS — E291 Testicular hypofunction: Secondary | ICD-10-CM

## 2017-01-17 MED ORDER — OCTREOTIDE ACETATE 30 MG IM KIT
30.0000 mg | PACK | Freq: Once | INTRAMUSCULAR | Status: AC
  Administered 2017-01-17: 30 mg via INTRAMUSCULAR

## 2017-01-17 MED ORDER — TESTOSTERONE CYPIONATE 200 MG/ML IM SOLN
100.0000 mg | Freq: Once | INTRAMUSCULAR | Status: AC
  Administered 2017-01-17: 100 mg via INTRAMUSCULAR

## 2017-01-17 NOTE — Patient Instructions (Signed)
Pt here for injections only.  

## 2017-01-30 ENCOUNTER — Encounter: Payer: Self-pay | Admitting: Urgent Care

## 2017-01-30 ENCOUNTER — Ambulatory Visit (INDEPENDENT_AMBULATORY_CARE_PROVIDER_SITE_OTHER): Payer: BLUE CROSS/BLUE SHIELD | Admitting: Urgent Care

## 2017-01-30 VITALS — BP 110/76 | HR 82 | Temp 98.6°F | Resp 16

## 2017-01-30 DIAGNOSIS — E291 Testicular hypofunction: Secondary | ICD-10-CM | POA: Diagnosis not present

## 2017-01-30 MED ORDER — TESTOSTERONE CYPIONATE 200 MG/ML IM SOLN
100.0000 mg | Freq: Once | INTRAMUSCULAR | Status: AC
  Administered 2017-01-30: 100 mg via INTRAMUSCULAR

## 2017-01-30 NOTE — Patient Instructions (Signed)
Pt here for Testosterone Cypionate 200mg /ml 0.5 cc given IM.

## 2017-01-31 NOTE — Progress Notes (Signed)
I did not examine this patient. 

## 2017-02-02 DIAGNOSIS — E220 Acromegaly and pituitary gigantism: Secondary | ICD-10-CM | POA: Diagnosis not present

## 2017-02-02 DIAGNOSIS — D509 Iron deficiency anemia, unspecified: Secondary | ICD-10-CM | POA: Diagnosis not present

## 2017-02-02 DIAGNOSIS — E349 Endocrine disorder, unspecified: Secondary | ICD-10-CM | POA: Diagnosis not present

## 2017-02-02 DIAGNOSIS — E890 Postprocedural hypothyroidism: Secondary | ICD-10-CM | POA: Diagnosis not present

## 2017-02-02 DIAGNOSIS — D497 Neoplasm of unspecified behavior of endocrine glands and other parts of nervous system: Secondary | ICD-10-CM | POA: Diagnosis not present

## 2017-02-02 DIAGNOSIS — E2740 Unspecified adrenocortical insufficiency: Secondary | ICD-10-CM | POA: Diagnosis not present

## 2017-02-13 ENCOUNTER — Ambulatory Visit: Payer: BLUE CROSS/BLUE SHIELD | Admitting: *Deleted

## 2017-02-13 DIAGNOSIS — E291 Testicular hypofunction: Secondary | ICD-10-CM

## 2017-02-13 DIAGNOSIS — E220 Acromegaly and pituitary gigantism: Secondary | ICD-10-CM

## 2017-02-13 NOTE — Progress Notes (Unsigned)
Patient here for testosterone 0.5 cc and SANDOSTATIN 30 mg only.

## 2017-02-28 ENCOUNTER — Ambulatory Visit (INDEPENDENT_AMBULATORY_CARE_PROVIDER_SITE_OTHER): Payer: BLUE CROSS/BLUE SHIELD | Admitting: Family Medicine

## 2017-02-28 VITALS — BP 108/74 | HR 63 | Temp 97.9°F | Resp 16

## 2017-02-28 DIAGNOSIS — E291 Testicular hypofunction: Secondary | ICD-10-CM | POA: Diagnosis not present

## 2017-02-28 MED ORDER — TESTOSTERONE CYPIONATE 200 MG/ML IM SOLN
100.0000 mg | Freq: Once | INTRAMUSCULAR | Status: AC
  Administered 2017-02-28: 100 mg via INTRAMUSCULAR

## 2017-02-28 NOTE — Progress Notes (Signed)
Pt here for Testosterone injection only. 

## 2017-02-28 NOTE — Patient Instructions (Signed)
Pt here for Testosterone Cypionate injection 200/ml, 0.5 ml given.

## 2017-03-09 DIAGNOSIS — I2510 Atherosclerotic heart disease of native coronary artery without angina pectoris: Secondary | ICD-10-CM | POA: Diagnosis not present

## 2017-03-09 DIAGNOSIS — I428 Other cardiomyopathies: Secondary | ICD-10-CM | POA: Diagnosis not present

## 2017-03-09 DIAGNOSIS — I493 Ventricular premature depolarization: Secondary | ICD-10-CM | POA: Diagnosis not present

## 2017-03-09 DIAGNOSIS — Z9889 Other specified postprocedural states: Secondary | ICD-10-CM | POA: Diagnosis not present

## 2017-03-13 ENCOUNTER — Ambulatory Visit (INDEPENDENT_AMBULATORY_CARE_PROVIDER_SITE_OTHER): Payer: BLUE CROSS/BLUE SHIELD | Admitting: Family Medicine

## 2017-03-13 VITALS — BP 111/70 | HR 68 | Temp 98.0°F

## 2017-03-13 DIAGNOSIS — E291 Testicular hypofunction: Secondary | ICD-10-CM | POA: Diagnosis not present

## 2017-03-13 DIAGNOSIS — E220 Acromegaly and pituitary gigantism: Secondary | ICD-10-CM | POA: Diagnosis not present

## 2017-03-13 MED ORDER — OCTREOTIDE ACETATE 30 MG IM KIT
30.0000 mg | PACK | Freq: Once | INTRAMUSCULAR | Status: AC
  Administered 2018-08-11: 30 mg via INTRAMUSCULAR

## 2017-03-13 MED ORDER — TESTOSTERONE CYPIONATE 200 MG/ML IM SOLN
100.0000 mg | Freq: Once | INTRAMUSCULAR | Status: AC
  Administered 2017-03-13: 100 mg via INTRAMUSCULAR

## 2017-03-13 MED ORDER — TESTOSTERONE CYPIONATE 200 MG/ML IM SOLN
100.0000 mg | Freq: Once | INTRAMUSCULAR | Status: AC
  Administered 2018-07-27: 100 mg via INTRAMUSCULAR

## 2017-03-13 NOTE — Progress Notes (Signed)
Pt here for testosterone and Sandostatin LAR DEPOT injections.

## 2017-03-13 NOTE — Patient Instructions (Signed)
Pt here for testosterone and Sandostatin LAR Depot injections only.

## 2017-03-30 ENCOUNTER — Ambulatory Visit (INDEPENDENT_AMBULATORY_CARE_PROVIDER_SITE_OTHER): Payer: BLUE CROSS/BLUE SHIELD | Admitting: Family Medicine

## 2017-03-30 VITALS — BP 122/74 | HR 65 | Temp 97.2°F | Resp 16 | Wt 253.6 lb

## 2017-03-30 DIAGNOSIS — E291 Testicular hypofunction: Secondary | ICD-10-CM | POA: Diagnosis not present

## 2017-03-30 MED ORDER — TESTOSTERONE CYPIONATE 200 MG/ML IM SOLN
100.0000 mg | Freq: Once | INTRAMUSCULAR | Status: AC
  Administered 2017-03-30: 100 mg via INTRAMUSCULAR

## 2017-03-30 NOTE — Progress Notes (Signed)
Nurse visit for injection only

## 2017-03-30 NOTE — Patient Instructions (Signed)
Testosterone injection only. Testosterone Cypionate 200mg /ml. Dosage 0.75ml.

## 2017-04-10 ENCOUNTER — Ambulatory Visit (INDEPENDENT_AMBULATORY_CARE_PROVIDER_SITE_OTHER): Payer: BLUE CROSS/BLUE SHIELD | Admitting: Physician Assistant

## 2017-04-10 VITALS — BP 119/75 | HR 63 | Temp 98.3°F | Wt 249.4 lb

## 2017-04-10 DIAGNOSIS — E220 Acromegaly and pituitary gigantism: Secondary | ICD-10-CM

## 2017-04-10 MED ORDER — OCTREOTIDE ACETATE 30 MG IM KIT
30.0000 mg | PACK | Freq: Once | INTRAMUSCULAR | Status: AC
Start: 2017-04-10 — End: 2017-04-10
  Administered 2017-04-10: 30 mg via INTRAMUSCULAR

## 2017-04-10 NOTE — Patient Instructions (Signed)
Pt here for Sandostatin LAR Depot 30 mg injection given in left gluteal IM.

## 2017-04-10 NOTE — Progress Notes (Signed)
Pt here for Sandostatin 30 mg injection only.

## 2017-04-22 ENCOUNTER — Encounter: Payer: Self-pay | Admitting: Physician Assistant

## 2017-04-27 ENCOUNTER — Ambulatory Visit: Payer: BLUE CROSS/BLUE SHIELD

## 2017-05-01 DIAGNOSIS — I471 Supraventricular tachycardia: Secondary | ICD-10-CM | POA: Diagnosis not present

## 2017-05-01 DIAGNOSIS — I428 Other cardiomyopathies: Secondary | ICD-10-CM | POA: Diagnosis not present

## 2017-05-01 DIAGNOSIS — I2510 Atherosclerotic heart disease of native coronary artery without angina pectoris: Secondary | ICD-10-CM | POA: Diagnosis not present

## 2017-05-11 ENCOUNTER — Ambulatory Visit (INDEPENDENT_AMBULATORY_CARE_PROVIDER_SITE_OTHER): Payer: BLUE CROSS/BLUE SHIELD | Admitting: Family Medicine

## 2017-05-11 DIAGNOSIS — E220 Acromegaly and pituitary gigantism: Secondary | ICD-10-CM

## 2017-05-11 DIAGNOSIS — E291 Testicular hypofunction: Secondary | ICD-10-CM | POA: Diagnosis not present

## 2017-05-11 MED ORDER — OCTREOTIDE ACETATE 30 MG IM KIT
30.0000 mg | PACK | Freq: Once | INTRAMUSCULAR | Status: AC
  Administered 2017-05-11: 30 mg via INTRAMUSCULAR

## 2017-05-11 MED ORDER — TESTOSTERONE CYPIONATE 200 MG/ML IM SOLN
100.0000 mg | Freq: Once | INTRAMUSCULAR | Status: AC
  Administered 2017-05-11: 100 mg via INTRAMUSCULAR

## 2017-05-11 NOTE — Progress Notes (Signed)
Subjective:     Patient ID: Mark Gallagher, male   DOB: 01-25-1970, 47 y.o.   MRN: [id number]  HPI  Patient here for injections of testosterone cypionate 200 mg/ml 0.5 mg administered and Sandostatin LAR [place] 30 mg administered only. Patient did not see a doctor for administration of medications.  Review of Systems     Objective:   Physical Exam     Assessment:         Plan:

## 2017-05-13 NOTE — Progress Notes (Signed)
Procedure only visit.

## 2017-05-14 DIAGNOSIS — M79672 Pain in left foot: Secondary | ICD-10-CM | POA: Diagnosis not present

## 2017-05-14 DIAGNOSIS — M2042 Other hammer toe(s) (acquired), left foot: Secondary | ICD-10-CM | POA: Diagnosis not present

## 2017-05-25 ENCOUNTER — Ambulatory Visit (INDEPENDENT_AMBULATORY_CARE_PROVIDER_SITE_OTHER): Payer: BLUE CROSS/BLUE SHIELD | Admitting: Family Medicine

## 2017-05-25 VITALS — BP 120/82 | HR 67 | Temp 98.2°F | Resp 16 | Wt 251.6 lb

## 2017-05-25 DIAGNOSIS — E291 Testicular hypofunction: Secondary | ICD-10-CM | POA: Diagnosis not present

## 2017-05-25 MED ORDER — TESTOSTERONE CYPIONATE 200 MG/ML IM SOLN
100.0000 mg | Freq: Once | INTRAMUSCULAR | Status: AC
  Administered 2017-05-25: 100 mg via INTRAMUSCULAR

## 2017-05-25 NOTE — Patient Instructions (Signed)
Pt here for Testosterone injection 0.5 ml given in left upper outer quadrant.

## 2017-05-25 NOTE — Progress Notes (Signed)
present for testosterone injection only. Patient not seen.

## 2017-06-10 ENCOUNTER — Encounter: Payer: Self-pay | Admitting: Family Medicine

## 2017-06-10 ENCOUNTER — Ambulatory Visit (INDEPENDENT_AMBULATORY_CARE_PROVIDER_SITE_OTHER): Payer: BLUE CROSS/BLUE SHIELD | Admitting: Family Medicine

## 2017-06-10 VITALS — BP 130/73 | HR 40 | Temp 98.4°F | Resp 16 | Wt 251.8 lb

## 2017-06-10 DIAGNOSIS — E220 Acromegaly and pituitary gigantism: Secondary | ICD-10-CM

## 2017-06-10 DIAGNOSIS — E291 Testicular hypofunction: Secondary | ICD-10-CM

## 2017-06-10 MED ORDER — OCTREOTIDE ACETATE 30 MG IM KIT
30.0000 mg | PACK | Freq: Once | INTRAMUSCULAR | Status: AC
  Administered 2017-06-10: 30 mg via INTRAMUSCULAR

## 2017-06-10 MED ORDER — TESTOSTERONE CYPIONATE 200 MG/ML IM SOLN
100.0000 mg | Freq: Once | INTRAMUSCULAR | Status: AC
  Administered 2017-06-10: 100 mg via INTRAMUSCULAR

## 2017-06-10 NOTE — Patient Instructions (Addendum)
Pt here for Testosterone injection 0.5 ml and Sandostatin LAR 30mg  DEPOT IM  injection only. Pt was given Sandostatin LAR Depot in the left upper outer quadrant. I attempted to inject the original needle that comes with the syringe into the left gluteal, the needle clogged. I switched the needle out with a 22 gauge and attempt to inject and  was not successful, I attempted to give injection in the right upper outer quadrant, again was unsuccessful,due to needle continued to clog. I switched back to the left upper outer quadrant, attempt unsuccessful, at this time. I switched needle out to a 18G needle and attempt was successful in the left upper outer quadrant. Both the needle and syringe were switched out for administration of desired dosage per recommendations.

## 2017-06-10 NOTE — Progress Notes (Addendum)
procedure visit only. Briefly discussed ongoing monitoring of levels that are being performed by specialists at [facility].

## 2017-06-11 ENCOUNTER — Encounter: Payer: Self-pay | Admitting: Family Medicine

## 2017-06-24 DIAGNOSIS — M545 Low back pain: Secondary | ICD-10-CM | POA: Diagnosis not present

## 2017-06-25 ENCOUNTER — Encounter: Payer: Self-pay | Admitting: Family Medicine

## 2017-06-25 ENCOUNTER — Ambulatory Visit: Payer: BLUE CROSS/BLUE SHIELD | Admitting: Family Medicine

## 2017-06-25 ENCOUNTER — Other Ambulatory Visit: Payer: Self-pay

## 2017-06-25 VITALS — BP 102/68 | HR 83 | Temp 97.4°F | Resp 18 | Ht 78.0 in | Wt 251.6 lb

## 2017-06-25 DIAGNOSIS — E291 Testicular hypofunction: Secondary | ICD-10-CM

## 2017-06-25 DIAGNOSIS — J029 Acute pharyngitis, unspecified: Secondary | ICD-10-CM | POA: Diagnosis not present

## 2017-06-25 LAB — POCT RAPID STREP A (OFFICE): Rapid Strep A Screen: NEGATIVE

## 2017-06-25 MED ORDER — TESTOSTERONE CYPIONATE 200 MG/ML IM SOLN
100.0000 mg | Freq: Once | INTRAMUSCULAR | Status: AC
  Administered 2017-06-25: 100 mg via INTRAMUSCULAR

## 2017-06-25 NOTE — Progress Notes (Signed)
Patient in the office another visit and requested getting his testosterone cypionate shot. Patient supplied the medication to be administered, the sig is 200 mg/ml giving 0.5 mg (100 mg) every 14 days. I drew up 100 mg and administered in the upper right outer quadrant.

## 2017-06-25 NOTE — Addendum Note (Signed)
Addended by: Alfredia Ferguson A on: 06/25/2017 05:57 PM   Modules accepted: Orders

## 2017-06-25 NOTE — Patient Instructions (Addendum)
Strep test in the office was negative or normal.  Based on your current symptoms it appears you likely have a viral infection.  I do not think antibiotics are needed at this time, but I will check a throat culture to make sure.  Continue sore throat lozenges, Tylenol if needed for discomfort, cold or warm fluids, whichever feels better.  See other information with sore throat and upper respiratory infections below.  If any fevers or worsening symptoms, return for recheck.  Return to the clinic or go to the nearest emergency room if any of your symptoms worsen or new symptoms occur.   Upper Respiratory Infection, Adult Most upper respiratory infections (URIs) are caused by a virus. A URI affects the nose, throat, and upper air passages. The most common type of URI is often called "the common cold." Follow these instructions at home:  Take medicines only as told by your doctor.  Gargle warm saltwater or take cough drops to comfort your throat as told by your doctor.  Use a warm mist humidifier or inhale steam from a shower to increase air moisture. This may make it easier to breathe.  Drink enough fluid to keep your pee (urine) clear or pale yellow.  Eat soups and other clear broths.  Have a healthy diet.  Rest as needed.  Go back to work when your fever is gone or your doctor says it is okay. ? You may need to stay home longer to avoid giving your URI to others. ? You can also wear a face mask and wash your hands often to prevent spread of the virus.  Use your inhaler more if you have asthma.  Do not use any tobacco products, including cigarettes, chewing tobacco, or electronic cigarettes. If you need help quitting, ask your doctor. Contact a doctor if:  You are getting worse, not better.  Your symptoms are not helped by medicine.  You have chills.  You are getting more short of breath.  You have brown or red mucus.  You have yellow or brown discharge from your nose.  You  have pain in your face, especially when you bend forward.  You have a fever.  You have puffy (swollen) neck glands.  You have pain while swallowing.  You have white areas in the back of your throat. Get help right away if:  You have very bad or constant: ? Headache. ? Ear pain. ? Pain in your forehead, behind your eyes, and over your cheekbones (sinus pain). ? Chest pain.  You have long-lasting (chronic) lung disease and any of the following: ? Wheezing. ? Long-lasting cough. ? Coughing up blood. ? A change in your usual mucus.  You have a stiff neck.  You have changes in your: ? Vision. ? Hearing. ? Thinking. ? Mood. This information is not intended to replace advice given to you by your health care provider. Make sure you discuss any questions you have with your health care provider. Document Released: 06/25/2007 Document Revised: 09/09/2015 Document Reviewed: 04/13/2013 Elsevier Interactive Patient Education  2018 [employer].  Sore Throat A sore throat is pain, burning, irritation, or scratchiness in the throat. When you have a sore throat, you may feel pain or tenderness in your throat when you swallow or talk. Many things can cause a sore throat, including:  An infection.  Seasonal allergies.  Dryness in the air.  Irritants, such as smoke or pollution.  Gastroesophageal reflux disease (GERD).  A tumor.  A sore throat is  often the first sign of another sickness. It may happen with other symptoms, such as coughing, sneezing, fever, and swollen neck glands. Most sore throats go away without medical treatment. Follow these instructions at home:  Take over-the-counter medicines only as told by your health care provider.  Drink enough fluids to keep your urine clear or pale yellow.  Rest as needed.  To help with pain, try: ? Sipping warm liquids, such as broth, herbal tea, or warm water. ? Eating or drinking cold or frozen liquids, such as frozen ice  pops. ? Gargling with a salt-water mixture 3-4 times a day or as needed. To make a salt-water mixture, completely dissolve -1 tsp of salt in 1 cup of warm water. ? Sucking on hard candy or throat lozenges. ? Putting a cool-mist humidifier in your bedroom at night to moisten the air. ? Sitting in the bathroom with the door closed for 5-10 minutes while you run hot water in the shower.  Do not use any tobacco products, such as cigarettes, chewing tobacco, and e-cigarettes. If you need help quitting, ask your health care provider. Contact a health care provider if:  You have a fever for more than 2-3 days.  You have symptoms that last (are persistent) for more than 2-3 days.  Your throat does not get better within 7 days.  You have a fever and your symptoms suddenly get worse. Get help right away if:  You have difficulty breathing.  You cannot swallow fluids, soft foods, or your saliva.  You have increased swelling in your throat or neck.  You have persistent nausea and vomiting. This information is not intended to replace advice given to you by your health care provider. Make sure you discuss any questions you have with your health care provider. Document Released: 02/14/2004 Document Revised: 09/02/2015 Document Reviewed: 10/27/2014 Elsevier Interactive Patient Education  2018 [employer].     IF you received an x-ray today, you will receive an invoice from [facility]. Please contact [facility] at [phone] with questions or concerns regarding your invoice.   IF you received labwork today, you will receive an invoice from [place]. Please contact [facility] at [phone] with questions or concerns regarding your invoice.   Our billing staff will not be able to assist you with questions regarding bills from these companies.  You will be contacted with the lab results as soon as they are available. The fastest way to get your results is to activate your My  Chart account. Instructions are located on the last page of this paperwork. If you have not heard from [place] regarding the results in 2 weeks, please contact this office.

## 2017-06-25 NOTE — Progress Notes (Signed)
Subjective:    Patient ID: Mark Gallagher, male    DOB: [birth year], 47 y.o.   MRN: [id number]  HPI Mark Gallagher is a 47 y.o. male Presents today for: Chief Complaint  Patient presents with  . Sore Throat    started yesterday   . Nasal Congestion  . Fever  . Fatigue   Presents with above symptoms. Started yesterday evening at 5pm.  Woke up at 5pm after nap with sore throat, runny nose. Home temp - 97.2, 97.3. Fever yesterday evening at 99. No higher readings. Sick contacts - living with parents. Dad has had runny nose, cough and sneeze past 5 days. No know sick contact with strep throat.  [place] did have strep that he was around 12 days ago.   Feels better today except primary symptom of sore throat and slight fatigue. Feels a little better than last night. Able to sleep ok overnight and felt rested this am. Runny nose slightly today as well.   Tx: throat lozenges  (cepacol). Moderate relief.   Patient Active Problem List   Diagnosis Date Noted  . Constipation 12/15/2016  . Traumatic closed fracture of distal clavicle with minimal displacement, left, with routine healing, subsequent encounter 03/24/2016  . Left knee pain 09/25/2015  . Adjustment disorder with depressed mood   . Tetraplegia ([place]) 09/19/2014  . Acute blood loss anemia 09/19/2014  . Cervical spondylosis with myelopathy 09/18/2014  . Radiculopathy of arm 08/28/2014  . Hypogonadism male 09/08/2013  . 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 Cardiomyopathy 04/01/2011  . Wolff-Parkinson-White (WPW) syndrome 04/01/2011   Past Medical History:  Diagnosis Date  . 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 Cancer ([place])   . History of pituitary tumor   . Knee pain, bilateral   . Testosterone deficiency   . Thyroid disease    Past Surgical History:  Procedure Laterality Date  . CARDIAC ELECTROPHYSIOLOGY STUDY AND ABLATION    . CERVICAL DISC SURGERY  2007   Anterior C4/5 vertebrectomy and c3-C6 laminoplasty with plating  . CRANIOTOMY FOR  TUMOR     pituitary adenoma   Allergies  Allergen Reactions  . Oxycodone     Nausea?   Prior to Admission medications   Medication Sig Start Date End Date Taking? Authorizing Provider  aspirin EC 81 MG tablet Take 81 mg by mouth daily.   Yes [provider]  bisoprolol (ZEBETA) 5 MG tablet Take 5 mg by mouth daily.   Yes [provider]  Cholecalciferol (VITAMIN D3) 2000 UNITS capsule Take by mouth.   Yes [provider]  enalapril (VASOTEC) 5 MG tablet Take 5 mg by mouth 2 (two) times daily.    Yes [provider]  eplerenone (INSPRA) 50 MG tablet Take 50 mg by mouth daily.   Yes [provider]  ferrous sulfate 325 (65 FE) MG tablet Take 1 tablet (325 mg total) by mouth 2 (two) times daily with a meal. 10/05/14  Yes Love, Ivan Anchors, PA-C  glycerin adult 2 g suppository Place 1 suppository rectally as needed for constipation. 12/13/16  Yes Shawnee Knapp, MD  hydrocortisone (CORTEF) 10 MG tablet 15 mg in the morning and 5 mg in the afternoon. Needs 30 extra tabs for sick days 12/24/15  Yes [provider]  Lactulose 20 GM/30ML SOLN Take 30 mLs (20 g total) by mouth 2 (two) times daily. 01/12/17  Yes Posey Boyer, MD  levothyroxine (SYNTHROID, LEVOTHROID) 200 MCG tablet Take 1 tablet (200 mcg total)  by mouth daily. 10/05/14  Yes Love, Ivan Anchors, PA-C  losartan (COZAAR) 25 MG tablet Take 25 mg by mouth daily.   Yes [provider]  octreotide (SANDOSTATIN LAR) 30 MG injection Inject 1 syringe (30 mg) into the muscle monthy 12/24/15  Yes [provider]  testosterone cypionate (DEPOTESTOSTERONE CYPIONATE) 200 MG/ML injection Inject 1 mL (200 mg total) into the muscle every 14 (fourteen) days. 12/22/15  Yes Wardell Honour, MD  amiodarone (PACERONE) 400 MG tablet Take by mouth. 09/20/15 11/17/15  [provider]  Menthol-Methyl Salicylate (MUSCLE RUB) 10-15 % CREA Apply 1 application topically as needed for muscle  pain. Patient not taking: Reported on 06/25/2017 10/05/14   Love, Ivan Anchors, PA-C  senna (SENOKOT) 8.6 MG TABS tablet Take 2 tablets (17.2 mg total) by mouth at bedtime as needed for mild constipation. Patient not taking: Reported on 06/25/2017 12/13/16   Shawnee Knapp, MD  traMADol Veatrice Bourbon) 50 MG tablet Take by mouth every 6 (six) hours as needed.    [provider]   Social History   Socioeconomic History  . Marital status: Single    Spouse name: Not on file  . Number of children: Not on file  . Years of education: Not on file  . Highest education level: Not on file  Occupational History  . Not on file  Social Needs  . Financial resource strain: Not on file  . Food insecurity:    Worry: Not on file    Inability: Not on file  . Transportation needs:    Medical: Not on file    Non-medical: Not on file  Tobacco Use  . Smoking status: Never Smoker  . Smokeless tobacco: Never Used  Substance and Sexual Activity  . Alcohol use: No  . Drug use: No  . Sexual activity: Not on file  Lifestyle  . Physical activity:    Days per week: Not on file    Minutes per session: Not on file  . Stress: Not on file  Relationships  . Social connections:    Talks on phone: Not on file    Gets together: Not on file    Attends religious service: Not on file    Active member of club or organization: Not on file    Attends meetings of clubs or organizations: Not on file    Relationship status: Not on file  . Intimate partner violence:    Fear of current or ex partner: Not on file    Emotionally abused: Not on file    Physically abused: Not on file    Forced sexual activity: Not on file  Other Topics Concern  . Not on file  Social History Narrative  . Not on file     Review of Systems  Constitutional: Positive for fever.  HENT: Positive for rhinorrhea and sore throat.   Respiratory: Negative for cough and shortness of breath.   Musculoskeletal: Negative for myalgias.        Objective:   Physical Exam  Constitutional: He is oriented to person, place, and time. He appears well-developed and well-nourished.  HENT:  Head: Normocephalic and atraumatic.  Right Ear: Tympanic membrane, external ear and ear canal normal.  Left Ear: Tympanic membrane, external ear and ear canal normal.  Nose: No rhinorrhea.  Mouth/Throat: Oropharynx is clear and moist and mucous membranes are normal. No oropharyngeal exudate or posterior oropharyngeal erythema.  Eyes: Pupils are equal, round, and reactive to light. Conjunctivae are normal.  Neck: Neck supple.  Cardiovascular: Normal rate, regular rhythm, normal heart sounds and intact distal pulses.  No murmur heard. Pulmonary/Chest: Effort normal and breath sounds normal. He has no wheezes. He has no rhonchi. He has no rales.  Abdominal: Soft. There is no tenderness.  Lymphadenopathy:    He has no cervical adenopathy.  Neurological: He is alert and oriented to person, place, and time.  Skin: Skin is warm and dry. No rash noted.  Psychiatric: He has a normal mood and affect. His behavior is normal.  Vitals reviewed. Sinuses nontender Vitals:   06/25/17 1533  BP: 102/68  Pulse: 83  Resp: 18  Temp: (!) 97.4 F (36.3 C)  TempSrc: Oral  SpO2: 96%  Weight: 251 lb 9.6 oz (114.1 kg)  Height: 6\' 6"  (1.981 m)   Results for orders placed or performed in visit on 06/25/17  POCT rapid strep A  Result Value Ref Range   Rapid Strep A Screen Negative Negative       Assessment & Plan:   Mark Gallagher is a 47 y.o. male Sore throat - Plan: POCT rapid strep A, Culture, Group A Strep  -Suspected viral illness, no true measured fever.  Reassuring exam without lymphadenopathy, lungs clear, suspected viral illness with similar symptoms and sick contact.  -Symptomatic care discussed with sore throat lozenges, Tylenol, fluids, rest and RTC precautions.  Will check throat culture but suspect that will also be negative.  No orders of the  defined types were placed in this encounter.  Patient Instructions   Strep test in the office was negative or normal.  Based on your current symptoms it appears you likely have a viral infection.  I do not think antibiotics are needed at this time, but I will check a throat culture to make sure.  Continue sore throat lozenges, Tylenol if needed for discomfort, cold or warm fluids, whichever feels better.  See other information with sore throat and upper respiratory infections below.  If any fevers or worsening symptoms, return for recheck.  Return to the clinic or go to the nearest emergency room if any of your symptoms worsen or new symptoms occur.   Upper Respiratory Infection, Adult Most upper respiratory infections (URIs) are caused by a virus. A URI affects the nose, throat, and upper air passages. The most common type of URI is often called "the common cold." Follow these instructions at home:  Take medicines only as told by your doctor.  Gargle warm saltwater or take cough drops to comfort your throat as told by your doctor.  Use a warm mist humidifier or inhale steam from a shower to increase air moisture. This may make it easier to breathe.  Drink enough fluid to keep your pee (urine) clear or pale yellow.  Eat soups and other clear broths.  Have a healthy diet.  Rest as needed.  Go back to work when your fever is gone or your doctor says it is okay. ? You may need to stay home longer to avoid giving your URI to others. ? You can also wear a face mask and wash your hands often to prevent spread of the virus.  Use your inhaler more if you have asthma.  Do not use any tobacco products, including cigarettes, chewing tobacco, or electronic cigarettes. If you need help quitting, ask your doctor. Contact a doctor if:  You are getting worse, not better.  Your symptoms are not helped by medicine.  You have chills.  You are  getting more short of breath.  You have brown or  red mucus.  You have yellow or brown discharge from your nose.  You have pain in your face, especially when you bend forward.  You have a fever.  You have puffy (swollen) neck glands.  You have pain while swallowing.  You have white areas in the back of your throat. Get help right away if:  You have very bad or constant: ? Headache. ? Ear pain. ? Pain in your forehead, behind your eyes, and over your cheekbones (sinus pain). ? Chest pain.  You have long-lasting (chronic) lung disease and any of the following: ? Wheezing. ? Long-lasting cough. ? Coughing up blood. ? A change in your usual mucus.  You have a stiff neck.  You have changes in your: ? Vision. ? Hearing. ? Thinking. ? Mood. This information is not intended to replace advice given to you by your health care provider. Make sure you discuss any questions you have with your health care provider. Document Released: 06/25/2007 Document Revised: 09/09/2015 Document Reviewed: 04/13/2013 Elsevier Interactive Patient Education  2018 [place].  Sore Throat A sore throat is pain, burning, irritation, or scratchiness in the throat. When you have a sore throat, you may feel pain or tenderness in your throat when you swallow or talk. Many things can cause a sore throat, including:  An infection.  Seasonal allergies.  Dryness in the air.  Irritants, such as smoke or pollution.  Gastroesophageal reflux disease (GERD).  A tumor.  A sore throat is often the first sign of another sickness. It may happen with other symptoms, such as coughing, sneezing, fever, and swollen neck glands. Most sore throats go away without medical treatment. Follow these instructions at home:  Take over-the-counter medicines only as told by your health care provider.  Drink enough fluids to keep your urine clear or pale yellow.  Rest as needed.  To help with pain, try: ? Sipping warm liquids, such as broth, herbal tea, or warm  water. ? Eating or drinking cold or frozen liquids, such as frozen ice pops. ? Gargling with a salt-water mixture 3-4 times a day or as needed. To make a salt-water mixture, completely dissolve -1 tsp of salt in 1 cup of warm water. ? Sucking on hard candy or throat lozenges. ? Putting a cool-mist humidifier in your bedroom at night to moisten the air. ? Sitting in the bathroom with the door closed for 5-10 minutes while you run hot water in the shower.  Do not use any tobacco products, such as cigarettes, chewing tobacco, and e-cigarettes. If you need help quitting, ask your health care provider. Contact a health care provider if:  You have a fever for more than 2-3 days.  You have symptoms that last (are persistent) for more than 2-3 days.  Your throat does not get better within 7 days.  You have a fever and your symptoms suddenly get worse. Get help right away if:  You have difficulty breathing.  You cannot swallow fluids, soft foods, or your saliva.  You have increased swelling in your throat or neck.  You have persistent nausea and vomiting. This information is not intended to replace advice given to you by your health care provider. Make sure you discuss any questions you have with your health care provider. Document Released: 02/14/2004 Document Revised: 09/02/2015 Document Reviewed: 10/27/2014 Elsevier Interactive Patient Education  2018 [employer].     IF you received an x-ray today,  you will receive an invoice from [facility], [facility], [facility]. Please contact [facility] at [phone] with questions or concerns regarding your invoice.   IF you received labwork today, you will receive an invoice from [facility]. Please contact [facility] at [phone] with questions or concerns regarding your invoice.   Our billing staff will not be able to assist you with questions regarding bills from these companies.  You will be contacted with the lab results as soon as they  are available. The fastest way to get your results is to activate your My Chart account. Instructions are located on the last page of this paperwork. If you have not heard from [place] regarding the results in 2 weeks, please contact this office.      Signed,   [name], MD [facility].  06/25/17 4:08 PM

## 2017-06-27 ENCOUNTER — Ambulatory Visit: Payer: BLUE CROSS/BLUE SHIELD | Admitting: Family Medicine

## 2017-06-27 LAB — CULTURE, GROUP A STREP: Strep A Culture: NEGATIVE

## 2017-06-29 ENCOUNTER — Encounter: Payer: Self-pay | Admitting: Urgent Care

## 2017-06-29 ENCOUNTER — Telehealth: Payer: Self-pay | Admitting: Family Medicine

## 2017-06-29 ENCOUNTER — Ambulatory Visit (INDEPENDENT_AMBULATORY_CARE_PROVIDER_SITE_OTHER): Payer: BLUE CROSS/BLUE SHIELD | Admitting: Urgent Care

## 2017-06-29 VITALS — BP 113/77 | HR 82 | Temp 97.6°F | Resp 18 | Ht 78.0 in | Wt 251.0 lb

## 2017-06-29 DIAGNOSIS — J029 Acute pharyngitis, unspecified: Secondary | ICD-10-CM | POA: Diagnosis not present

## 2017-06-29 DIAGNOSIS — J069 Acute upper respiratory infection, unspecified: Secondary | ICD-10-CM

## 2017-06-29 DIAGNOSIS — R05 Cough: Secondary | ICD-10-CM

## 2017-06-29 DIAGNOSIS — R059 Cough, unspecified: Secondary | ICD-10-CM

## 2017-06-29 DIAGNOSIS — R5381 Other malaise: Secondary | ICD-10-CM

## 2017-06-29 DIAGNOSIS — R5383 Other fatigue: Secondary | ICD-10-CM

## 2017-06-29 MED ORDER — HYDROCODONE-HOMATROPINE 5-1.5 MG/5ML PO SYRP: 5.0000 mL | ORAL_SOLUTION | Freq: Every evening | ORAL | 0 refills | Status: DC | PRN

## 2017-06-29 MED ORDER — BENZONATATE 100 MG PO CAPS: 100.0000 mg | ORAL_CAPSULE | Freq: Three times a day (TID) | ORAL | 0 refills | Status: DC | PRN

## 2017-06-29 MED ORDER — PREDNISONE 10 MG PO TABS: 40.0000 mg | ORAL_TABLET | Freq: Every day | ORAL | 0 refills | Status: DC

## 2017-06-29 NOTE — Patient Instructions (Addendum)
Hydrate well with at least 2 liters (1 gallon) of water daily. For sore throat try using a honey-based tea. Use 3 teaspoons of honey with juice squeezed from half lemon. Place shaved pieces of ginger into 1/2-1 cup of water and warm over stove top. Then mix the ingredients and repeat every 4 hours as needed.     Viral Respiratory Infection A respiratory infection is an illness that affects part of the respiratory system, such as the lungs, nose, or throat. Most respiratory infections are caused by either viruses or bacteria. A respiratory infection that is caused by a virus is called a viral respiratory infection. Common types of viral respiratory infections include:  A cold.  The flu (influenza).  A respiratory syncytial virus (RSV) infection.  How do I know if I have a viral respiratory infection? Most viral respiratory infections cause:  A stuffy or runny nose.  Yellow or green nasal discharge.  A cough.  Sneezing.  Fatigue.  Achy muscles.  A sore throat.  Sweating or chills.  A fever.  A headache.  How are viral respiratory infections treated? If influenza is diagnosed early, it may be treated with an antiviral medicine that shortens the length of time a person has symptoms. Symptoms of viral respiratory infections may be treated with over-the-counter and prescription medicines, such as:  Expectorants. These make it easier to cough up mucus.  Decongestant nasal sprays.  Health care providers do not prescribe antibiotic medicines for viral infections. This is because antibiotics are designed to kill bacteria. They have no effect on viruses. How do I know if I should stay home from work or school? To avoid exposing others to your respiratory infection, stay home if you have:  A fever.  A persistent cough.  A sore throat.  A runny nose.  Sneezing.  Muscles aches.  Headaches.  Fatigue.  Weakness.  Chills.  Sweating.  Nausea.  Follow these  instructions at home:  Rest as much as possible.  Take over-the-counter and prescription medicines only as told by your health care provider.  Drink enough fluid to keep your urine clear or pale yellow. This helps prevent dehydration and helps loosen up mucus.  Gargle with a salt-water mixture 3-4 times per day or as needed. To make a salt-water mixture, completely dissolve -1 tsp of salt in 1 cup of warm water.  Use nose drops made from salt water to ease congestion and soften raw skin around your nose.  Do not drink alcohol.  Do not use tobacco products, including cigarettes, chewing tobacco, and e-cigarettes. If you need help quitting, ask your health care provider. Contact a health care provider if:  Your symptoms last for 10 days or longer.  Your symptoms get worse over time.  You have a fever.  You have severe sinus pain in your face or forehead.  The glands in your jaw or neck become very swollen. Get help right away if:  You feel pain or pressure in your chest.  You have shortness of breath.  You faint or feel like you will faint.  You have severe and persistent vomiting.  You feel confused or disoriented. This information is not intended to replace advice given to you by your health care provider. Make sure you discuss any questions you have with your health care provider. Document Released: 10/16/2004 Document Revised: 06/14/2015 Document Reviewed: 06/14/2014 Elsevier Interactive Patient Education  2018 [employer]     IF you received an x-ray today, you will receive   an invoice from [facility]. Please contact [facility] at [phone] with questions or concerns regarding your invoice.   IF you received labwork today, you will receive an invoice from [facility]. Please contact [facility] at [phone] with questions or concerns regarding your invoice.   Our billing staff will not be able to assist you with questions regarding bills from  these companies.  You will be contacted with the lab results as soon as they are available. The fastest way to get your results is to activate your My Chart account. Instructions are located on the last page of this paperwork. If you have not heard from us regarding the results in 2 weeks, please contact this office.      

## 2017-06-29 NOTE — Progress Notes (Signed)
    MRN: [id number] DOB: [birth year]  Subjective:   Mark Gallagher is a 47 y.o. male presenting for 5-day history of sore throat, sinus congestion, fever (highest was about 101 F), malaise, productive cough, fatigue.  Patient was seen on 06/25/2017 by his PCP, Dr. Carlota Raspberry.  He has been using supportive care since then.  Today reports that his symptoms are persisting but not necessarily worsening.  He would like to be aggressive with medications to make sure he gets better in time for his birthday celebration.  Denies chest pain, shortness of breath, wheezing, sinus pain, ear pain.  Admits that he had some GI upset including loose stools and nausea over the weekend.  Reports that he does not know if he had an allergy to oxycodone, felt that it was more likely that he took it on empty stomach and vomited it right after taking it.  He would like to try a strong cough medicine.  [name] has a current medication list which includes the following prescription(s): aspirin ec, vitamin d3, enalapril, eplerenone, ferrous sulfate, glycerin adult, hydrocortisone, lactulose, levothyroxine, octreotide, testosterone cypionate, and tramadol, and the following Facility-Administered Medications: octreotide, octreotide, octreotide, testosterone cypionate, testosterone cypionate, and testosterone cypionate. Also is allergic to oxycodone.  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 Also  has a past surgical history that includes Cervical disc surgery (2007); Craniotomy for tumor; and Cardiac electrophysiology study and ablation.  Objective:   Vitals: BP 113/77   Pulse 82   Temp 97.6 F (36.4 C) (Oral)   Resp 18   Ht 6\' 6"  (1.981 m)   Wt 251 lb (113.9 kg)   SpO2 99%   BMI 29.01 kg/m   Physical Exam  Constitutional: He is oriented to person, place, and time. He appears well-developed and  well-nourished.  HENT:  Throat with postnasal drainage in thick streaks.  TMs opaque bilaterally but without erythema.  No sinus tenderness.  Cardiovascular: Normal rate, regular rhythm and intact distal pulses. Exam reveals no gallop and no friction rub.  No murmur heard. Pulmonary/Chest: No respiratory distress. He has no wheezes. He has no rales.  Neurological: He is alert and oriented to person, place, and time.    Assessment and Plan :   Viral URI  Sore throat  Cough  Malaise and fatigue  Will manage as a viral URI with supportive care.  He is to start prednisone course as well.  Counseled on possibility of an allergic reaction with hydrocodone given his medical history. Counseled patient on potential for adverse effects with medications prescribed today, patient verbalized understanding. Return-to-clinic precautions discussed, patient verbalized understanding.   Jaynee Eagles, PA-C [facility] [phone] 06/29/2017  3:41 PM

## 2017-06-29 NOTE — Telephone Encounter (Signed)
Copied from [place] [id number]. Topic: Quick Communication - Lab Results >> Jun 29, 2017  9:22 AM Suszanne Finch, LPN wrote: Hulen Skains patient to inform them of  lab results. When patient returns call, triage nurse may disclose results. [phone]

## 2017-06-29 NOTE — Telephone Encounter (Signed)
Pt given results per notes of Dr. Carlota Raspberry on 06/28/17, patient verbalized understanding. Unable to document in result note due to result note not being routed to [facility].

## 2017-07-03 DIAGNOSIS — M71572 Other bursitis, not elsewhere classified, left ankle and foot: Secondary | ICD-10-CM | POA: Diagnosis not present

## 2017-07-09 ENCOUNTER — Ambulatory Visit (INDEPENDENT_AMBULATORY_CARE_PROVIDER_SITE_OTHER): Payer: BLUE CROSS/BLUE SHIELD | Admitting: Physician Assistant

## 2017-07-09 VITALS — BP 114/74 | HR 41 | Temp 97.7°F | Resp 16

## 2017-07-09 DIAGNOSIS — E220 Acromegaly and pituitary gigantism: Secondary | ICD-10-CM | POA: Diagnosis not present

## 2017-07-09 DIAGNOSIS — E291 Testicular hypofunction: Secondary | ICD-10-CM | POA: Diagnosis not present

## 2017-07-09 MED ORDER — TESTOSTERONE CYPIONATE 200 MG/ML IM SOLN
100.0000 mg | Freq: Once | INTRAMUSCULAR | Status: AC
  Administered 2017-07-09: 100 mg via INTRAMUSCULAR

## 2017-07-09 MED ORDER — OCTREOTIDE ACETATE 30 MG IM KIT
30.0000 mg | PACK | Freq: Once | INTRAMUSCULAR | Status: AC
  Administered 2017-07-09: 30 mg via INTRAMUSCULAR

## 2017-07-09 NOTE — Progress Notes (Signed)
Pt here for Testosterone injection 0.5 ml in the right upper outer quadrant. Octreotide acetate (Sandostatin LAR [place]) 30mg  given in the left upper outer quadrant.

## 2017-07-27 ENCOUNTER — Ambulatory Visit (INDEPENDENT_AMBULATORY_CARE_PROVIDER_SITE_OTHER): Payer: BLUE CROSS/BLUE SHIELD | Admitting: Family Medicine

## 2017-07-27 DIAGNOSIS — E291 Testicular hypofunction: Secondary | ICD-10-CM

## 2017-07-27 MED ORDER — TESTOSTERONE CYPIONATE 200 MG/ML IM SOLN
100.0000 mg | Freq: Once | INTRAMUSCULAR | Status: AC
  Administered 2017-07-27: 100 mg via INTRAMUSCULAR

## 2017-07-27 NOTE — Progress Notes (Signed)
Patient ID: Mark Gallagher, male   DOB: [birth year], 47 y.o.   MRN: [id number]  Patient here for Testosterone Cypionate 200 mg/ml injection. Patient was administer 0.5 mg (100)mg only as directed every 14 days. The patient wants to get the injection early for the month of August on 08/22/2017, he will be out of town 08/24/2017 thru 08/28/2017. I advised Dr Carlota Raspberry of patient's request and he stated it would be okay.

## 2017-08-11 ENCOUNTER — Ambulatory Visit (INDEPENDENT_AMBULATORY_CARE_PROVIDER_SITE_OTHER): Payer: BLUE CROSS/BLUE SHIELD | Admitting: Family Medicine

## 2017-08-11 DIAGNOSIS — E220 Acromegaly and pituitary gigantism: Secondary | ICD-10-CM

## 2017-08-11 MED ORDER — OCTREOTIDE ACETATE 30 MG IM KIT
30.0000 mg | PACK | Freq: Once | INTRAMUSCULAR | Status: AC
  Administered 2017-08-11: 30 mg via INTRAMUSCULAR

## 2017-08-11 NOTE — Progress Notes (Signed)
Patient here Sandostatin LAR [place] 30 injection only. Injection was administered IM to the left upper outer quadrant. Nurse/CMA visit only, patient did not see a provide at this visit.

## 2017-08-12 ENCOUNTER — Ambulatory Visit (INDEPENDENT_AMBULATORY_CARE_PROVIDER_SITE_OTHER): Payer: BLUE CROSS/BLUE SHIELD | Admitting: Physician Assistant

## 2017-08-12 DIAGNOSIS — E291 Testicular hypofunction: Secondary | ICD-10-CM | POA: Diagnosis not present

## 2017-08-14 NOTE — Patient Instructions (Signed)
Pt here for Testosterone Cypionate 200 mg/ml, 0.5cc given in Left upper outer quadrant.

## 2017-08-29 ENCOUNTER — Ambulatory Visit (INDEPENDENT_AMBULATORY_CARE_PROVIDER_SITE_OTHER): Payer: BLUE CROSS/BLUE SHIELD | Admitting: Emergency Medicine

## 2017-08-29 VITALS — BP 104/74 | HR 87 | Temp 98.2°F | Resp 16

## 2017-08-29 DIAGNOSIS — E291 Testicular hypofunction: Secondary | ICD-10-CM | POA: Diagnosis not present

## 2017-08-29 NOTE — Progress Notes (Signed)
  Subjective:     Patient ID: Mark Gallagher, male   DOB: 1971/01/11, 47 y.o.   MRN: [id number]  HPI   Patient here for testosterone cypionate 0.5 mg of 200 mg/ml injection only.  Administered 0.5 mg in the right upper outer quadrant.   Review of Systems     Objective:   Physical Exam     Assessment:      Plan:

## 2017-09-09 DIAGNOSIS — M25562 Pain in left knee: Secondary | ICD-10-CM | POA: Diagnosis not present

## 2017-09-09 DIAGNOSIS — M25561 Pain in right knee: Secondary | ICD-10-CM | POA: Diagnosis not present

## 2017-09-15 ENCOUNTER — Ambulatory Visit (INDEPENDENT_AMBULATORY_CARE_PROVIDER_SITE_OTHER): Payer: BLUE CROSS/BLUE SHIELD | Admitting: Family Medicine

## 2017-09-15 VITALS — BP 108/64 | HR 41 | Temp 98.1°F | Wt 250.8 lb

## 2017-09-15 DIAGNOSIS — E220 Acromegaly and pituitary gigantism: Secondary | ICD-10-CM

## 2017-09-15 DIAGNOSIS — E291 Testicular hypofunction: Secondary | ICD-10-CM | POA: Diagnosis not present

## 2017-09-15 MED ORDER — OCTREOTIDE ACETATE 30 MG IM KIT
30.0000 mg | PACK | Freq: Once | INTRAMUSCULAR | Status: AC
  Administered 2017-09-15: 30 mg via INTRAMUSCULAR

## 2017-09-15 MED ORDER — TESTOSTERONE CYPIONATE 200 MG/ML IM SOLN
100.0000 mg | Freq: Once | INTRAMUSCULAR | Status: AC
  Administered 2017-09-15: 100 mg via INTRAMUSCULAR

## 2017-09-15 NOTE — Patient Instructions (Signed)
Pt here for Testosterone Cypionate injection 0.5 ml. Sandostatin LAR depot 30 mg injection only.

## 2017-09-18 DIAGNOSIS — D225 Melanocytic nevi of trunk: Secondary | ICD-10-CM | POA: Diagnosis not present

## 2017-09-18 DIAGNOSIS — D485 Neoplasm of uncertain behavior of skin: Secondary | ICD-10-CM | POA: Diagnosis not present

## 2017-09-24 DIAGNOSIS — Z0271 Encounter for disability determination: Secondary | ICD-10-CM

## 2017-09-30 ENCOUNTER — Ambulatory Visit (INDEPENDENT_AMBULATORY_CARE_PROVIDER_SITE_OTHER): Payer: BLUE CROSS/BLUE SHIELD | Admitting: Family Medicine

## 2017-09-30 VITALS — HR 40 | Temp 98.1°F | Resp 16

## 2017-09-30 DIAGNOSIS — E291 Testicular hypofunction: Secondary | ICD-10-CM

## 2017-09-30 MED ORDER — TESTOSTERONE CYPIONATE 200 MG/ML IM SOLN
100.0000 mg | Freq: Once | INTRAMUSCULAR | Status: AC
  Administered 2017-09-30: 100 mg via INTRAMUSCULAR

## 2017-09-30 NOTE — Progress Notes (Signed)
Patient ID: Mark Gallagher, male   DOB: 24-Jan-1970, 47 y.o.   MRN: [id number]  Patient here for testosterone 0.5 mg of 200 mg/ml injection only.

## 2017-10-07 DIAGNOSIS — M25561 Pain in right knee: Secondary | ICD-10-CM | POA: Diagnosis not present

## 2017-10-07 DIAGNOSIS — M25562 Pain in left knee: Secondary | ICD-10-CM | POA: Diagnosis not present

## 2017-10-13 DIAGNOSIS — D497 Neoplasm of unspecified behavior of endocrine glands and other parts of nervous system: Secondary | ICD-10-CM | POA: Diagnosis not present

## 2017-10-13 DIAGNOSIS — E890 Postprocedural hypothyroidism: Secondary | ICD-10-CM | POA: Diagnosis not present

## 2017-10-13 DIAGNOSIS — E220 Acromegaly and pituitary gigantism: Secondary | ICD-10-CM | POA: Diagnosis not present

## 2017-10-13 DIAGNOSIS — E349 Endocrine disorder, unspecified: Secondary | ICD-10-CM | POA: Diagnosis not present

## 2017-10-13 NOTE — Progress Notes (Signed)
I did not see this pt. Injection only.

## 2017-10-15 ENCOUNTER — Ambulatory Visit: Payer: BLUE CROSS/BLUE SHIELD

## 2017-10-16 ENCOUNTER — Ambulatory Visit (INDEPENDENT_AMBULATORY_CARE_PROVIDER_SITE_OTHER): Payer: BLUE CROSS/BLUE SHIELD | Admitting: Family Medicine

## 2017-10-16 DIAGNOSIS — E291 Testicular hypofunction: Secondary | ICD-10-CM | POA: Diagnosis not present

## 2017-10-16 DIAGNOSIS — E220 Acromegaly and pituitary gigantism: Secondary | ICD-10-CM | POA: Diagnosis not present

## 2017-10-16 MED ORDER — OCTREOTIDE ACETATE 30 MG IM KIT
30.0000 mg | PACK | Freq: Once | INTRAMUSCULAR | Status: AC
  Administered 2017-10-16: 30 mg via INTRAMUSCULAR

## 2017-10-16 MED ORDER — TESTOSTERONE CYPIONATE 200 MG/ML IM SOLN: 100.0000 mg | Freq: Once | INTRAMUSCULAR | Status: DC

## 2017-10-16 NOTE — Progress Notes (Signed)
Patient ID: Mark Gallagher, male   DOB: 1970/05/13, 47 y.o.   MRN: [id number]   Patient here for testosterone injection of 0.5 ml of 200mg /ml to RUQ and Sandostatin 30 mg to LUQ.

## 2017-10-26 DIAGNOSIS — M170 Bilateral primary osteoarthritis of knee: Secondary | ICD-10-CM | POA: Diagnosis not present

## 2017-10-30 ENCOUNTER — Ambulatory Visit (INDEPENDENT_AMBULATORY_CARE_PROVIDER_SITE_OTHER): Payer: BLUE CROSS/BLUE SHIELD | Admitting: Family Medicine

## 2017-10-30 DIAGNOSIS — Z23 Encounter for immunization: Secondary | ICD-10-CM | POA: Diagnosis not present

## 2017-10-30 DIAGNOSIS — E291 Testicular hypofunction: Secondary | ICD-10-CM

## 2017-10-30 MED ORDER — TESTOSTERONE CYPIONATE 200 MG/ML IM SOLN
100.0000 mg | Freq: Once | INTRAMUSCULAR | Status: AC
  Administered 2017-10-30: 100 mg via INTRAMUSCULAR

## 2017-10-30 NOTE — Patient Instructions (Signed)
Pt here for Testosterone cypionate 200mg /ml injection only. Pt received 0.5 ml in left upper outer quadrant. Per pt request Flu injection 0.5 ml administered in right deltoid IM.

## 2017-11-02 DIAGNOSIS — M170 Bilateral primary osteoarthritis of knee: Secondary | ICD-10-CM | POA: Diagnosis not present

## 2017-11-05 DIAGNOSIS — I493 Ventricular premature depolarization: Secondary | ICD-10-CM | POA: Diagnosis not present

## 2017-11-05 DIAGNOSIS — I471 Supraventricular tachycardia: Secondary | ICD-10-CM | POA: Diagnosis not present

## 2017-11-05 DIAGNOSIS — I483 Typical atrial flutter: Secondary | ICD-10-CM | POA: Diagnosis not present

## 2017-11-05 DIAGNOSIS — I5021 Acute systolic (congestive) heart failure: Secondary | ICD-10-CM | POA: Diagnosis not present

## 2017-11-09 DIAGNOSIS — M170 Bilateral primary osteoarthritis of knee: Secondary | ICD-10-CM | POA: Diagnosis not present

## 2017-11-11 DIAGNOSIS — E890 Postprocedural hypothyroidism: Secondary | ICD-10-CM | POA: Diagnosis not present

## 2017-11-11 DIAGNOSIS — M4712 Other spondylosis with myelopathy, cervical region: Secondary | ICD-10-CM | POA: Diagnosis not present

## 2017-11-11 DIAGNOSIS — M4802 Spinal stenosis, cervical region: Secondary | ICD-10-CM | POA: Diagnosis not present

## 2017-11-11 DIAGNOSIS — E220 Acromegaly and pituitary gigantism: Secondary | ICD-10-CM | POA: Diagnosis not present

## 2017-11-11 DIAGNOSIS — D497 Neoplasm of unspecified behavior of endocrine glands and other parts of nervous system: Secondary | ICD-10-CM | POA: Diagnosis not present

## 2017-11-13 ENCOUNTER — Ambulatory Visit (INDEPENDENT_AMBULATORY_CARE_PROVIDER_SITE_OTHER): Payer: BLUE CROSS/BLUE SHIELD | Admitting: Family Medicine

## 2017-11-13 VITALS — BP 110/72 | HR 46 | Temp 98.2°F | Wt 250.4 lb

## 2017-11-13 DIAGNOSIS — E220 Acromegaly and pituitary gigantism: Secondary | ICD-10-CM | POA: Diagnosis not present

## 2017-11-13 DIAGNOSIS — E291 Testicular hypofunction: Secondary | ICD-10-CM | POA: Diagnosis not present

## 2017-11-13 MED ORDER — OCTREOTIDE ACETATE 30 MG IM KIT
30.0000 mg | PACK | Freq: Once | INTRAMUSCULAR | Status: AC
  Administered 2017-11-13: 30 mg via INTRAMUSCULAR

## 2017-11-13 MED ORDER — TESTOSTERONE CYPIONATE 200 MG/ML IM SOLN
100.0000 mg | Freq: Once | INTRAMUSCULAR | Status: AC
  Administered 2017-11-13: 100 mg via INTRAMUSCULAR

## 2017-11-13 NOTE — Progress Notes (Signed)
Pt here for Testosterone Cypionate 200mg /ml 0.5 mg given in right upper outer quadrant. Sandostatin LAR depot 30 mg given in left upper outer quadrant.

## 2017-11-16 DIAGNOSIS — E349 Endocrine disorder, unspecified: Secondary | ICD-10-CM | POA: Diagnosis not present

## 2017-11-16 DIAGNOSIS — E890 Postprocedural hypothyroidism: Secondary | ICD-10-CM | POA: Diagnosis not present

## 2017-11-16 DIAGNOSIS — D497 Neoplasm of unspecified behavior of endocrine glands and other parts of nervous system: Secondary | ICD-10-CM | POA: Diagnosis not present

## 2017-11-16 DIAGNOSIS — E220 Acromegaly and pituitary gigantism: Secondary | ICD-10-CM | POA: Diagnosis not present

## 2017-11-16 DIAGNOSIS — E2740 Unspecified adrenocortical insufficiency: Secondary | ICD-10-CM | POA: Diagnosis not present

## 2017-11-16 DIAGNOSIS — D509 Iron deficiency anemia, unspecified: Secondary | ICD-10-CM | POA: Diagnosis not present

## 2017-11-24 DIAGNOSIS — M4807 Spinal stenosis, lumbosacral region: Secondary | ICD-10-CM | POA: Diagnosis not present

## 2017-11-24 DIAGNOSIS — D497 Neoplasm of unspecified behavior of endocrine glands and other parts of nervous system: Secondary | ICD-10-CM | POA: Diagnosis not present

## 2017-11-24 DIAGNOSIS — M4802 Spinal stenosis, cervical region: Secondary | ICD-10-CM | POA: Diagnosis not present

## 2017-11-27 ENCOUNTER — Ambulatory Visit (INDEPENDENT_AMBULATORY_CARE_PROVIDER_SITE_OTHER): Payer: BLUE CROSS/BLUE SHIELD | Admitting: Physician Assistant

## 2017-11-27 VITALS — BP 106/76 | HR 38 | Temp 98.5°F | Resp 16 | Wt 253.2 lb

## 2017-11-27 DIAGNOSIS — E291 Testicular hypofunction: Secondary | ICD-10-CM | POA: Diagnosis not present

## 2017-11-27 MED ORDER — TESTOSTERONE CYPIONATE 200 MG/ML IM SOLN
100.0000 mg | Freq: Once | INTRAMUSCULAR | Status: AC
  Administered 2017-11-27: 100 mg via INTRAMUSCULAR

## 2017-11-27 NOTE — Patient Instructions (Signed)
Pt heere for testosterone Cypionate 200mg /ml  0.5 ml given in left upper outer quadrant.

## 2017-11-30 NOTE — Progress Notes (Signed)
FT injection only. I did not see this pt.

## 2017-12-12 ENCOUNTER — Ambulatory Visit (INDEPENDENT_AMBULATORY_CARE_PROVIDER_SITE_OTHER): Payer: BLUE CROSS/BLUE SHIELD | Admitting: Family Medicine

## 2017-12-12 DIAGNOSIS — E291 Testicular hypofunction: Secondary | ICD-10-CM

## 2017-12-12 DIAGNOSIS — E220 Acromegaly and pituitary gigantism: Secondary | ICD-10-CM | POA: Diagnosis not present

## 2017-12-12 MED ORDER — TESTOSTERONE CYPIONATE 200 MG/ML IM SOLN
100.0000 mg | Freq: Once | INTRAMUSCULAR | Status: AC
  Administered 2017-12-12: 100 mg via INTRAMUSCULAR

## 2017-12-12 MED ORDER — OCTREOTIDE ACETATE 30 MG IM KIT
30.0000 mg | PACK | Freq: Once | INTRAMUSCULAR | Status: AC
  Administered 2017-12-12: 30 mg via INTRAMUSCULAR

## 2017-12-12 NOTE — Progress Notes (Signed)
Patient is here for injections SANDOSTATIN LAR Depot and Testosterone 0.5 mg only.

## 2017-12-28 ENCOUNTER — Other Ambulatory Visit: Payer: Self-pay | Admitting: Orthopedic Surgery

## 2017-12-28 ENCOUNTER — Ambulatory Visit (INDEPENDENT_AMBULATORY_CARE_PROVIDER_SITE_OTHER): Payer: BLUE CROSS/BLUE SHIELD | Admitting: Family Medicine

## 2017-12-28 DIAGNOSIS — M545 Low back pain, unspecified: Secondary | ICD-10-CM

## 2017-12-28 DIAGNOSIS — E291 Testicular hypofunction: Secondary | ICD-10-CM | POA: Diagnosis not present

## 2017-12-28 DIAGNOSIS — M170 Bilateral primary osteoarthritis of knee: Secondary | ICD-10-CM | POA: Diagnosis not present

## 2017-12-28 MED ORDER — TESTOSTERONE CYPIONATE 200 MG/ML IM SOLN
100.0000 mg | Freq: Once | INTRAMUSCULAR | Status: AC
  Administered 2017-12-28: 100 mg via INTRAMUSCULAR

## 2017-12-28 NOTE — Progress Notes (Signed)
Patient here for testosterone cypionate 200 mg/ml injection only. Administered 0.5 mg to the LUQ without problem to site.

## 2017-12-28 NOTE — Patient Instructions (Signed)
° ° ° °  If you have lab work done today you will be contacted with your lab results within the next 2 weeks.  If you have not heard from us then please contact us. The fastest way to get your results is to register for My Chart. ° ° °IF you received an x-ray today, you will receive an invoice from [facility]. Please contact [facility] at [phone] with questions or concerns regarding your invoice.  ° °IF you received labwork today, you will receive an invoice from [facility]. Please contact [facility] at [phone] with questions or concerns regarding your invoice.  ° °Our billing staff will not be able to assist you with questions regarding bills from these companies. ° °You will be contacted with the lab results as soon as they are available. The fastest way to get your results is to activate your My Chart account. Instructions are located on the last page of this paperwork. If you have not heard from us regarding the results in 2 weeks, please contact this office. °  ° ° ° °

## 2017-12-30 DIAGNOSIS — M25551 Pain in right hip: Secondary | ICD-10-CM | POA: Diagnosis not present

## 2018-01-09 ENCOUNTER — Other Ambulatory Visit: Payer: Self-pay

## 2018-01-12 ENCOUNTER — Ambulatory Visit (INDEPENDENT_AMBULATORY_CARE_PROVIDER_SITE_OTHER): Payer: BLUE CROSS/BLUE SHIELD | Admitting: Family Medicine

## 2018-01-12 VITALS — BP 119/79 | HR 42 | Resp 16 | Wt 250.0 lb

## 2018-01-12 DIAGNOSIS — E220 Acromegaly and pituitary gigantism: Secondary | ICD-10-CM | POA: Diagnosis not present

## 2018-01-12 DIAGNOSIS — E291 Testicular hypofunction: Secondary | ICD-10-CM

## 2018-01-12 MED ORDER — TESTOSTERONE CYPIONATE 200 MG/ML IM SOLN
100.0000 mg | Freq: Once | INTRAMUSCULAR | Status: AC
  Administered 2018-01-12: 100 mg via INTRAMUSCULAR

## 2018-01-12 MED ORDER — OCTREOTIDE ACETATE 30 MG IM KIT
30.0000 mg | PACK | Freq: Once | INTRAMUSCULAR | Status: AC
  Administered 2018-01-12: 30 mg via INTRAMUSCULAR

## 2018-01-12 NOTE — Patient Instructions (Signed)
Pt here for Testosterone injection 200 mg/ml given 0.5 ml in right upper outer quadrant. Sandostatin LAR Depot 30 mg in Left upper outer quadrant.

## 2018-01-12 NOTE — Progress Notes (Signed)
Pt here for injections only.

## 2018-01-21 ENCOUNTER — Ambulatory Visit
Admission: RE | Admit: 2018-01-21 | Discharge: 2018-01-21 | Disposition: A | Discharge status: Home / Self Care | Payer: BLUE CROSS/BLUE SHIELD | Source: Ambulatory Visit | Attending: Orthopedic Surgery | Admitting: Orthopedic Surgery

## 2018-01-21 DIAGNOSIS — M545 Low back pain, unspecified: Secondary | ICD-10-CM

## 2018-01-21 DIAGNOSIS — M5127 Other intervertebral disc displacement, lumbosacral region: Secondary | ICD-10-CM | POA: Diagnosis not present

## 2018-01-21 DIAGNOSIS — M48061 Spinal stenosis, lumbar region without neurogenic claudication: Secondary | ICD-10-CM | POA: Diagnosis not present

## 2018-01-21 DIAGNOSIS — M5126 Other intervertebral disc displacement, lumbar region: Secondary | ICD-10-CM | POA: Diagnosis not present

## 2018-01-21 DIAGNOSIS — M47816 Spondylosis without myelopathy or radiculopathy, lumbar region: Secondary | ICD-10-CM | POA: Diagnosis not present

## 2018-01-26 ENCOUNTER — Ambulatory Visit (INDEPENDENT_AMBULATORY_CARE_PROVIDER_SITE_OTHER): Payer: BLUE CROSS/BLUE SHIELD | Admitting: Family Medicine

## 2018-01-26 DIAGNOSIS — E291 Testicular hypofunction: Secondary | ICD-10-CM

## 2018-01-26 MED ORDER — TESTOSTERONE CYPIONATE 200 MG/ML IM SOLN
100.0000 mg | Freq: Once | INTRAMUSCULAR | Status: AC
  Administered 2018-01-26: 100 mg via INTRAMUSCULAR

## 2018-01-26 NOTE — Progress Notes (Signed)
Patient here for Testosterone Cypionate 200 mg/ml, 0.5 mg was administered into the left upper outer quadrant.

## 2018-01-27 DIAGNOSIS — M25551 Pain in right hip: Secondary | ICD-10-CM | POA: Diagnosis not present

## 2018-01-29 IMAGING — DX DG ABDOMEN 2V
5 series · 5 of 5 positions shown · non-contrast
Comparison: None.

CLINICAL DATA: Constipation

EXAM:
ABDOMEN - 2 VIEW

[abdomen erect]
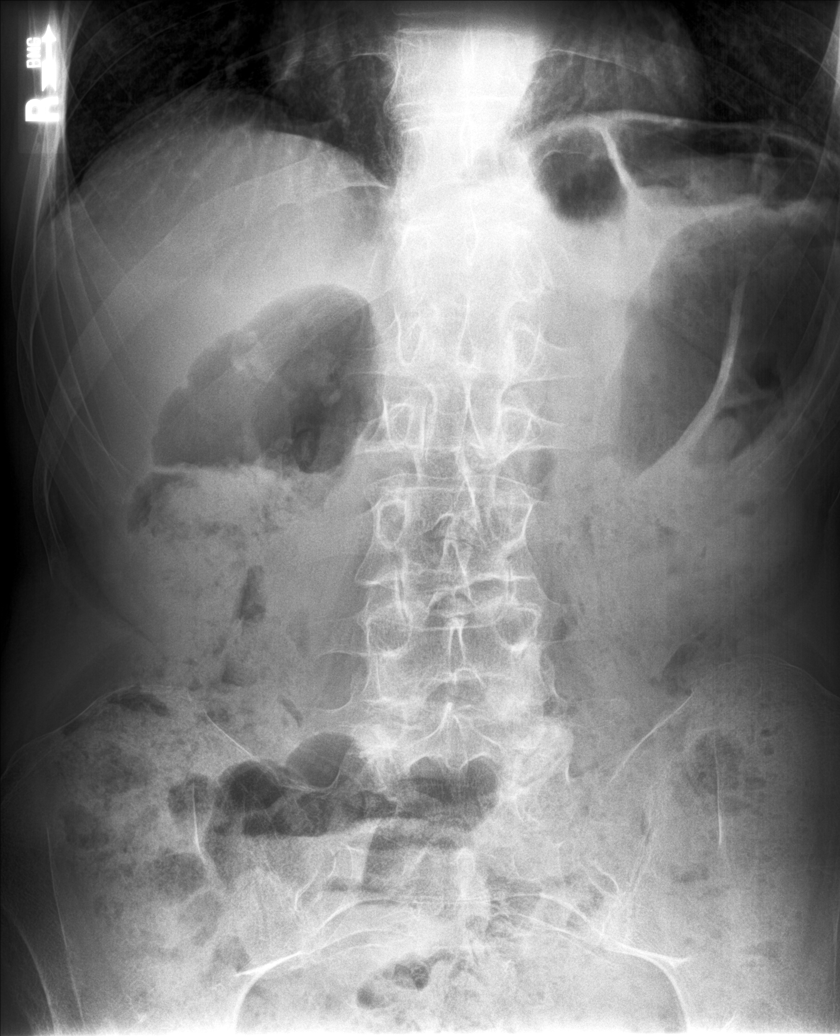

[abdomen supine (1 of 4)]
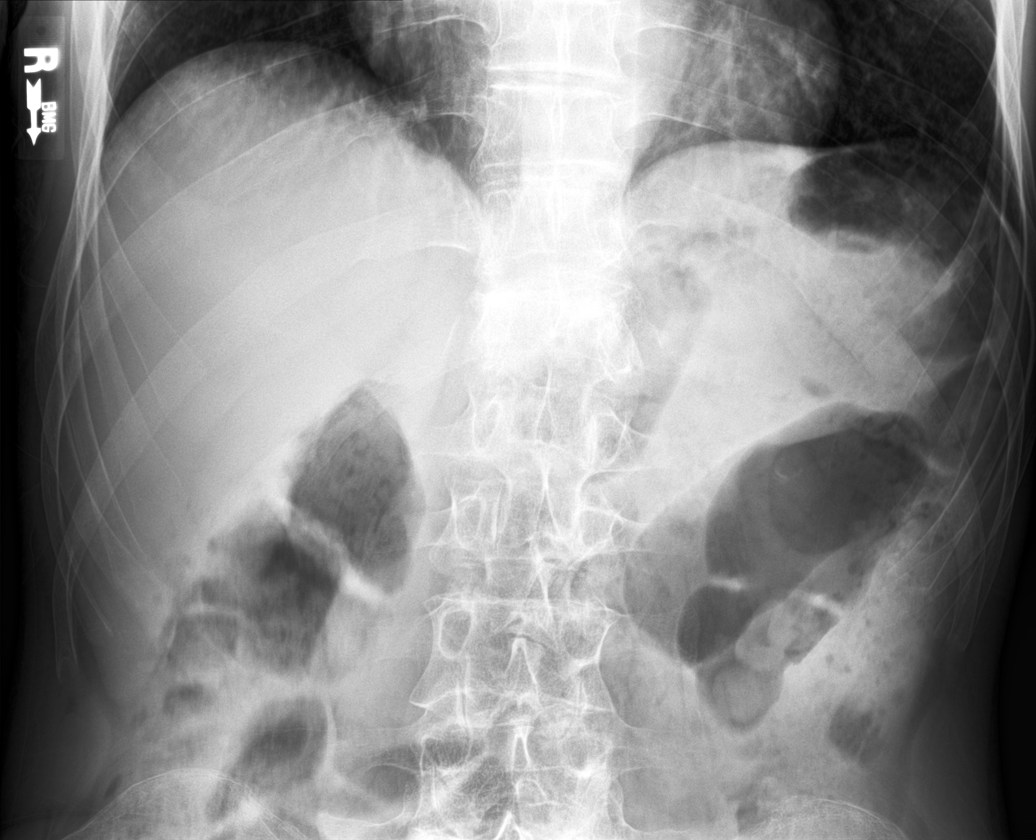

[abdomen supine (2 of 4)]
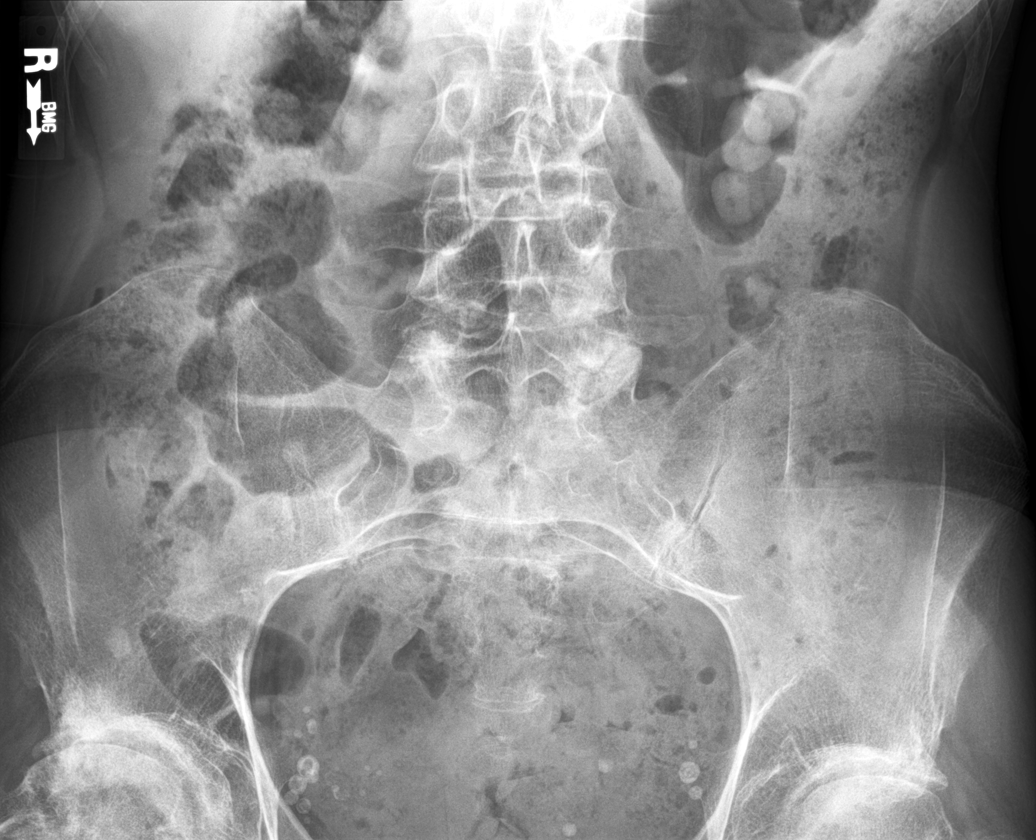

[abdomen supine (3 of 4)]
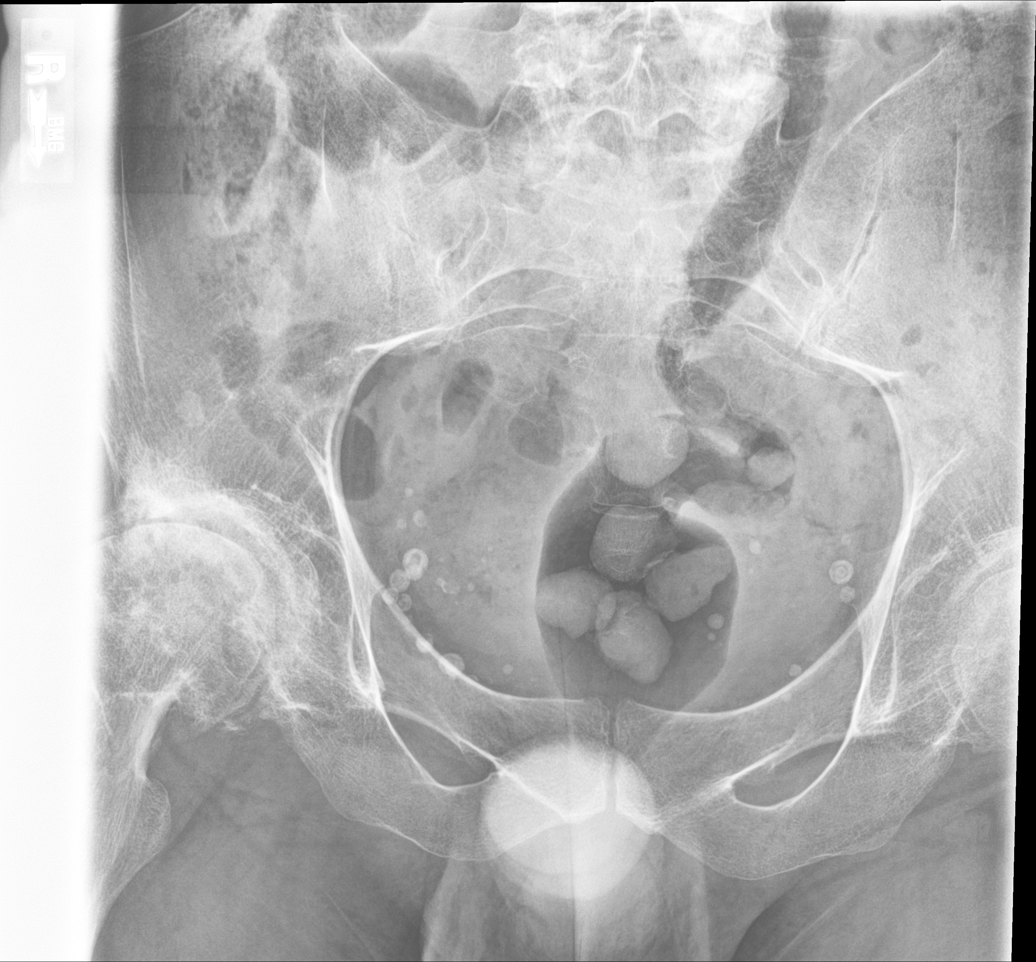

[abdomen supine (4 of 4)]
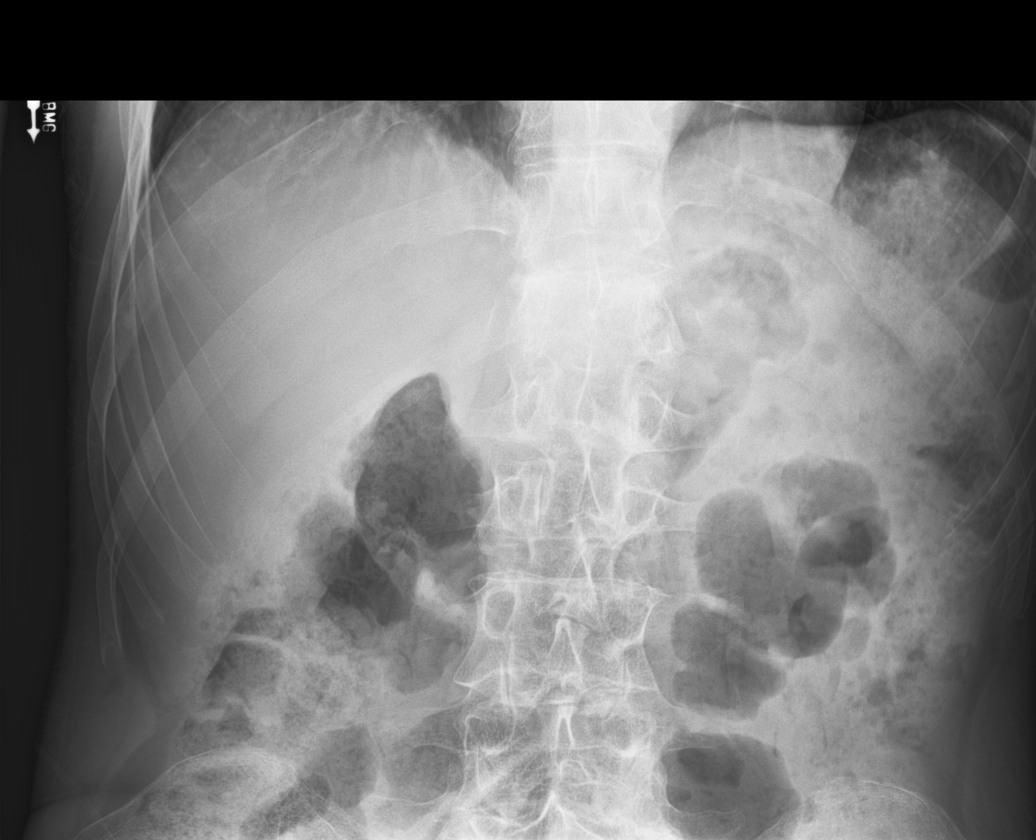

[5 of 5 positions shown; findings below may reference images not displayed]

FINDINGS: Large amount of fecal matter throughout the colon. Moderate amount
of intestinal gas. No small bowel obstructive pattern. No free air.
Multiple phleboliths in the pelvis. Advanced degenerative disease of
the right hip.
IMPRESSION: Large amount of fecal matter throughout the colon consistent with
the clinical history of constipation.

## 2018-02-01 DIAGNOSIS — M25551 Pain in right hip: Secondary | ICD-10-CM | POA: Diagnosis not present

## 2018-02-01 DIAGNOSIS — M5416 Radiculopathy, lumbar region: Secondary | ICD-10-CM | POA: Diagnosis not present

## 2018-02-02 ENCOUNTER — Telehealth: Payer: Self-pay | Admitting: Family Medicine

## 2018-02-02 NOTE — Telephone Encounter (Signed)
Copied from [place] [id number]. Topic: General - Inquiry >> Feb 02, 2018 11:04 AM Vernona Rieger wrote: Reason for CRM: Patient states he is having total hip replacement on 2/25 and said that [facility] has faxed the office for clearance. Advised to make an appt and he said that rather not because he gets blood work done every 6 months with his endocrinologist and because of expenses.  Please contact patient.

## 2018-02-04 ENCOUNTER — Telehealth: Payer: Self-pay | Admitting: Family Medicine

## 2018-02-04 NOTE — Telephone Encounter (Signed)
Pt states that his cardiologist cleared him for surgery. Pt asking if Dr. Carlota Raspberry can contact his endocrinologist. He states he is unable to call right now due to all the other prep he is doing for surgery. Pt states that he will come to his appt on 03/04/2018.

## 2018-02-04 NOTE — Telephone Encounter (Signed)
I have called the pt back but there was no answer. So I left a general message to call back.  Plan: when pt calls back please relay message from provider below.  Thanks, [employer]

## 2018-02-04 NOTE — Telephone Encounter (Signed)
appt scheduled for preop eval 03/04/18. With his cardiac history, I would ask that cardiology perform cardiac clearance.  He is followed by endocrinology at [facility] as well and would aks that they provide any specific recommendations for this surgery/anesthesia. Thanks.

## 2018-02-04 NOTE — Telephone Encounter (Signed)
Upon giving the clearance form to the provider, he has instructed me to call pt and inform him he will need clearance form his specaialites as well. I have attempted to pt,  However he did not answer.   The continuation of this note along with provider response is on telephone note 02/04/2018.  Thanks, [employer]

## 2018-02-04 NOTE — Telephone Encounter (Signed)
I have called Ardon back and informed him that in order for [place] to complete the paperwork he needs to be seen. He has not been seen in a while. He stated that he wanted to cut cost, I stated understanding and informed him that if his other doctors office that he gets his blood work can clear him than that would be fine as well. We are here help and an appointment has been scheduled for the clearance on Thu. 03/04/2018. He stated that he is also going to see if he can get cleared by his other doctors office. If not, then he will keep the appointment with Dr. Carlota Raspberry.   I have not received the Clearance form from [facility] as of yet so Nickolus stated that he is going to check on that form as well. If he decides to have [place] clear him for his total hip replacement then we get the paperwork.   Thanks, [employer]

## 2018-02-09 ENCOUNTER — Ambulatory Visit (INDEPENDENT_AMBULATORY_CARE_PROVIDER_SITE_OTHER): Payer: BLUE CROSS/BLUE SHIELD | Admitting: Family Medicine

## 2018-02-09 DIAGNOSIS — E291 Testicular hypofunction: Secondary | ICD-10-CM | POA: Diagnosis not present

## 2018-02-09 DIAGNOSIS — E220 Acromegaly and pituitary gigantism: Secondary | ICD-10-CM | POA: Diagnosis not present

## 2018-02-09 MED ORDER — OCTREOTIDE ACETATE 30 MG IM KIT
30.0000 mg | PACK | Freq: Once | INTRAMUSCULAR | Status: AC
  Administered 2018-02-09: 30 mg via INTRAMUSCULAR

## 2018-02-09 MED ORDER — TESTOSTERONE CYPIONATE 200 MG/ML IM SOLN
100.0000 mg | Freq: Once | INTRAMUSCULAR | Status: AC
  Administered 2018-02-09: 100 mg via INTRAMUSCULAR

## 2018-02-09 NOTE — Progress Notes (Signed)
Patient ID: Mark Gallagher, male   DOB: 11-06-1970, 48 y.o.   MRN: [id number]   Patient here for injections only for Sandostatin LAR Depot 30 mg right upper outer quadrant and testosterone cypionate 0.5 mg left upper outer quadrant. Patient tolerated injections well.

## 2018-02-12 NOTE — Telephone Encounter (Signed)
I have spoken to the pt and he stated that he has spoken to [employer] from [place] and [place] and Jaleen only needs to be cleared from Cardiology and Primary Care. Pt stated that he has already been cleared by cards and they have the paperwork.   He would like to make sure that he doesn't need anything before he can get cleared on 03/04/17 for he NEEDS to have the surgery on 03/16/18

## 2018-02-12 NOTE — Telephone Encounter (Signed)
Error. Disregard

## 2018-02-16 DIAGNOSIS — E220 Acromegaly and pituitary gigantism: Secondary | ICD-10-CM | POA: Diagnosis not present

## 2018-02-17 DIAGNOSIS — M5416 Radiculopathy, lumbar region: Secondary | ICD-10-CM | POA: Diagnosis not present

## 2018-02-20 NOTE — Telephone Encounter (Signed)
Noted.  Message sent to endocrinology, and surgeon as history of pituitary tumor to determine if other specific testing or recommendations needed.

## 2018-02-22 DIAGNOSIS — I4719 Other supraventricular tachycardia: Secondary | ICD-10-CM

## 2018-02-22 DIAGNOSIS — M21372 Foot drop, left foot: Secondary | ICD-10-CM | POA: Diagnosis present

## 2018-02-22 DIAGNOSIS — D509 Iron deficiency anemia, unspecified: Secondary | ICD-10-CM | POA: Diagnosis present

## 2018-02-22 DIAGNOSIS — E039 Hypothyroidism, unspecified: Secondary | ICD-10-CM | POA: Diagnosis present

## 2018-02-22 DIAGNOSIS — Z981 Arthrodesis status: Secondary | ICD-10-CM

## 2018-02-22 DIAGNOSIS — M1611 Unilateral primary osteoarthritis, right hip: Secondary | ICD-10-CM | POA: Diagnosis not present

## 2018-02-22 DIAGNOSIS — I471 Supraventricular tachycardia: Secondary | ICD-10-CM

## 2018-02-22 NOTE — H&P (Signed)
HIP ARTHROPLASTY ADMISSION H&P  Patient ID: Mark Gallagher MRN: [id number] DOB/AGE: [birth year] 48 y.o.  Chief Complaint: right hip pain.  Planned Procedure Date: 03/16/2018 Medical Clearance by Dr. Nyoka Cowden pending Cardiac Clearance by Dr. Pleas Patricia Additional clearance by endocrinology: Dr. Manley Mason  HPI: Mark Gallagher is a 48 y.o. male with a history of acromegaly, hypothyroidism, multilevel cervical spine fusion, iron deficiency anemia, left foot drop, and AVNRT status post ablation who presents for evaluation of OA RIGHT HIP. The patient has a history of pain and functional disability in the right hip due to arthritis and has failed non-surgical conservative treatments for greater than 12 weeks to include NSAID's and/or analgesics, corticosteriod injections, use of assistive devices and activity modification.  Onset of symptoms was gradual, starting 2 years ago with gradually worsening course since that time.  Patient currently rates pain at 9 out of 10 with activity. Patient has night pain, worsening of pain with activity and weight bearing and pain that interferes with activities of daily living.  Patient has evidence of subchondral cysts, subchondral sclerosis, periarticular osteophytes and joint space narrowing by imaging studies.  There is no active infection.  Past Medical History:  Diagnosis Date  . Acromegaly ([place])   . Cancer ([place])   . History of pituitary tumor   . Knee pain, bilateral   . Testosterone deficiency   . Thyroid disease    Past Surgical History:  Procedure Laterality Date  . CARDIAC ELECTROPHYSIOLOGY STUDY AND ABLATION    . CERVICAL DISC SURGERY  2007   Anterior C4/5 vertebrectomy and c3-C6 laminoplasty with plating  . CRANIOTOMY FOR TUMOR     pituitary adenoma   Allergies  Allergen Reactions  . Oxycodone     Nausea?   Prior to Admission medications   Medication Sig Start Date End Date Taking? Authorizing Provider  aspirin EC 81 MG tablet  Take 81 mg by mouth daily.    [provider]  benzonatate (TESSALON) 100 MG capsule Take 1-2 capsules (100-200 mg total) by mouth 3 (three) times daily as needed. 06/29/17   Jaynee Eagles, PA-C  Cholecalciferol (VITAMIN D3) 2000 UNITS capsule Take by mouth.    [provider]  enalapril (VASOTEC) 5 MG tablet Take 5 mg by mouth 2 (two) times daily.     [provider]  eplerenone (INSPRA) 50 MG tablet Take 50 mg by mouth daily.    [provider]  ferrous sulfate 325 (65 FE) MG tablet Take 1 tablet (325 mg total) by mouth 2 (two) times daily with a meal. 10/05/14   Love, Ivan Anchors, PA-C  glycerin adult 2 g suppository Place 1 suppository rectally as needed for constipation. 12/13/16   Shawnee Knapp, MD  HYDROcodone-homatropine [facility]) 5-1.5 MG/5ML syrup Take 5 mLs by mouth at bedtime as needed. 06/29/17   Jaynee Eagles, PA-C  hydrocortisone (CORTEF) 10 MG tablet 15 mg in the morning and 5 mg in the afternoon. Needs 30 extra tabs for sick days 12/24/15   [provider]  Lactulose 20 GM/30ML SOLN Take 30 mLs (20 g total) by mouth 2 (two) times daily. 01/12/17   Posey Boyer, MD  levothyroxine (SYNTHROID, LEVOTHROID) 200 MCG tablet Take 1 tablet (200 mcg total) by mouth daily. 10/05/14   Love, Ivan Anchors, PA-C  octreotide (SANDOSTATIN LAR) 30 MG injection Inject 1 syringe (30 mg) into the muscle monthy 12/24/15   [provider]  predniSONE (DELTASONE) 10 MG tablet Take 4 tablets (40  mg total) by mouth daily with breakfast. 06/29/17   Jaynee Eagles, PA-C  testosterone cypionate (DEPOTESTOSTERONE CYPIONATE) 200 MG/ML injection Inject 1 mL (200 mg total) into the muscle every 14 (fourteen) days. 12/22/15   Wardell Honour, MD  traMADol [facility]) 50 MG tablet Take by mouth every 6 (six) hours as needed.    [provider]   Social History   Socioeconomic History  . Marital status: Single    Spouse name: Not on file  . Number of children: Not on file  .  Years of education: Not on file  . Highest education level: Not on file  Occupational History  . Not on file  Social Needs  . Financial resource strain: Not on file  . Food insecurity:    Worry: Not on file    Inability: Not on file  . Transportation needs:    Medical: Not on file    Non-medical: Not on file  Tobacco Use  . Smoking status: Never Smoker  . Smokeless tobacco: Never Used  Substance and Sexual Activity  . Alcohol use: No  . Drug use: No  . Sexual activity: Not on file  Lifestyle  . Physical activity:    Days per week: Not on file    Minutes per session: Not on file  . Stress: Not on file  Relationships  . Social connections:    Talks on phone: Not on file    Gets together: Not on file    Attends religious service: Not on file    Active member of club or organization: Not on file    Attends meetings of clubs or organizations: Not on file    Relationship status: Not on file  Other Topics Concern  . Not on file  Social History Narrative  . Not on file   Family History  Problem Relation Age of Onset  . High blood pressure Mother   . Acute myelogenous leukemia Brother     ROS: Currently denies lightheadedness, dizziness, Fever, chills, CP, SOB.   No personal history of DVT, PE, MI, or CVA. No loose teeth or dentures. 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  He wears glasses for driving. All other systems have been reviewed and were otherwise currently negative with the exception of those mentioned in the HPI and as above.  Objective: Vitals: Ht: 6 feet 8 inches wt: 246 temp: 97.4 BP: 105/68 pulse: 39 O2 96 % on room air.   Physical Exam: General: Alert, NAD. Trendelenberg Gait.  Utilizes a cane. HEENT: EOMI, limited neck Extension Pulm: No increased work of breathing.  Clear B/L A/P w/o crackle or wheeze.  CV: Bradycardia, No m/g/r appreciated  GI: soft, NT, ND Neuro: Neuro without gross focal deficit.  Sensation intact distally Skin: No  lesions in the area of chief complaint MSK/Surgical Site: Right hip pain with passive ROM.  Positive Stinchfield.  5/5 strength.  NVI.  Sensation intact distally.  Imaging Review Plain radiographs demonstrate severe degenerative joint disease of the right hip.    Preoperative templating of the joint replacement has been completed, documented, and submitted to the Operating Room personnel in order to optimize intra-operative equipment management.  Assessment: OA RIGHT HIP Active Problems:   Acromegaly ([place])   Hypothyroidism   History of fusion of cervical spine   Iron deficiency anemia   Acquired left foot drop   Primary osteoarthritis of right hip   Plan: Plan for Procedure(s): TOTAL HIP ARTHROPLASTY  The patient history, physical  exam, clinical judgement of the provider and imaging are consistent with end stage degenerative joint disease and total joint arthroplasty is deemed medically necessary. The treatment options including medical management, injection therapy, and arthroplasty were discussed at length. The risks and benefits of Procedure(s): TOTAL HIP ARTHROPLASTY were presented and reviewed.  The risks of nonoperative treatment, versus surgical intervention including but not limited to continued pain, aseptic loosening, stiffness, dislocation/subluxation, infection, bleeding, nerve injury, blood clots, cardiopulmonary complications, morbidity, mortality, among others were discussed. The patient verbalizes understanding and wishes to proceed with the plan.  Patient is being admitted for inpatient treatment for surgery, pain control, PT, OT, prophylactic antibiotics, VTE prophylaxis, progressive ambulation, ADL's and discharge planning.   Dental prophylaxis discussed and recommended for 2 years postoperatively.   The patient does meet the criteria for TXA which will be used perioperatively.    ASA 81 mg BID will be used postoperatively for DVT prophylaxis in addition to SCDs,  and early ambulation.  The patient is planning to be discharged home with home health services ([facility]) in care of his parents.  Plan for Norco, gabapentin for pain.  He is asking cardiology if they are okay with him taking NSAIDs.  Endocrinology recommends stress dose steroid preop.  Monitor electrolytes and renal function.   Prudencio Burly III, PA-C 02/22/2018 6:36 PM

## 2018-02-24 ENCOUNTER — Ambulatory Visit (INDEPENDENT_AMBULATORY_CARE_PROVIDER_SITE_OTHER): Payer: BLUE CROSS/BLUE SHIELD | Admitting: Family Medicine

## 2018-02-24 VITALS — BP 98/58 | HR 40 | Temp 98.2°F | Resp 16

## 2018-02-24 DIAGNOSIS — E291 Testicular hypofunction: Secondary | ICD-10-CM

## 2018-02-24 MED ORDER — TESTOSTERONE CYPIONATE 200 MG/ML IM SOLN
100.0000 mg | Freq: Once | INTRAMUSCULAR | Status: AC
  Administered 2018-02-24: 100 mg via INTRAMUSCULAR

## 2018-02-24 NOTE — Patient Instructions (Signed)
Patient here for Testosterone Cypionate 200mg /ml 0.5cc given in left upper outer quadrant.

## 2018-02-24 NOTE — Progress Notes (Signed)
Injection only visit

## 2018-03-04 ENCOUNTER — Other Ambulatory Visit: Payer: Self-pay

## 2018-03-04 ENCOUNTER — Ambulatory Visit: Payer: BLUE CROSS/BLUE SHIELD | Admitting: Family Medicine

## 2018-03-04 VITALS — BP 116/82 | HR 62 | Temp 97.4°F | Resp 14 | Ht 78.0 in | Wt 247.6 lb

## 2018-03-04 DIAGNOSIS — Z01818 Encounter for other preprocedural examination: Secondary | ICD-10-CM

## 2018-03-04 NOTE — Patient Instructions (Addendum)
Cardiology clearance for your surgery as per your cardiologist.  From a medical standpoint, no specific restrictions or recommendations from neurologic standpoint.  Endocrinologist did apparently provide some recommendations to your surgeon.  Part of that was monitoring kidney function and electrolytes after surgery.  I am happy to check some those tests here with a lab only visit if needed after your surgery.  Additionally I would recommend urine testing to rule out infection near the time of your surgery and can provide that testing here if needed.  I did not order blood work or testing today as it appears you are having some of that done in a few days.  Specifically I would make sure that your kidney function, electrolytes, and blood counts are okay.  Let me know if there are questions.    If you have lab work done today you will be contacted with your lab results within the next 2 weeks.  If you have not heard from [place] then please contact us. The fastest way to get your results is to register for My Chart.   IF you received an x-ray today, you will receive an invoice from [facility]. Please contact [facility] at [phone] with questions or concerns regarding your invoice.   IF you received labwork today, you will receive an invoice from [facility]. Please contact [facility] at [phone] with questions or concerns regarding your invoice.   Our billing staff will not be able to assist you with questions regarding bills from these companies.  You will be contacted with the lab results as soon as they are available. The fastest way to get your results is to activate your My Chart account. Instructions are located on the last page of this paperwork. If you have not heard from [place] regarding the results in 2 weeks, please contact this office.

## 2018-03-04 NOTE — Progress Notes (Signed)
Subjective:    Patient ID: Mark Gallagher, male    DOB: 1970/05/08, 48 y.o.   MRN: [id number]  HPI Mark Gallagher is a 48 y.o. male Presents today for: Chief Complaint  Patient presents with  . medical evaluation    still having hip pain, which thinking surgery should help with the pain hip surgery is scheduled for 03/16/18. Have per op 03/09/18   Here for preoperative evaluation for planned right total hip arthroplasty for end-stage degenerative joint disease.  Plan for surgery on February 25 by Dr. Edmonia Lynch with [facility].  He is followed by cardiology (Dr. Pleas Patricia with [facility] cardiology) who have commented separately on cardiac clearance for surgery.  With history of acromegaly, history of pituitary tumor and panhypopituitarism form was also completed from endocrinology and I received a message from them as well regarding no specific restrictions for surgery, but orthopedic note does indicate that there was a recommendation for stress dose steroids preoperatively, monitoring electrolytes and renal function.  I also reached out to neurology/neurosurgeon regarding the surgery, and no specific restrictions from a neurologic standpoint were given.  It appears CBC and BMP orders have been placed on hold until his preoperative testing on February 18, along with EKG at that time. Thinks he will have urine testing a that time.   Last anesthesia for heart ablation - tolerated without difficulty.  Spinal fusion in 2016 - on problem with anesthesia.   Had clearance from cardiology at [facility]. No new chest pains or DOE.  appt with endocrine about 4 weeks after surgery.  RCRI risk index - score of 0 No hx of OSA.   Patient Active Problem List   Diagnosis Date Noted  . Hypothyroidism 02/22/2018  . History of fusion of cervical spine 02/22/2018  . Iron deficiency anemia 02/22/2018  . Acquired left foot drop 02/22/2018  . Primary osteoarthritis of right hip 02/22/2018  .  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 Constipation 12/15/2016  . Traumatic closed fracture of distal clavicle with minimal displacement, left, with routine healing, subsequent encounter 03/24/2016  . Left knee pain 09/25/2015  . Adjustment disorder with depressed mood   . Tetraplegia ([place]) 09/19/2014  . Acute blood loss anemia 09/19/2014  . Cervical spondylosis with myelopathy 09/18/2014  . Radiculopathy of arm 08/28/2014  . Hypogonadism male 09/08/2013  . 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 Cardiomyopathy 04/01/2011  . Wolff-Parkinson-White (WPW) syndrome 04/01/2011   Past Medical History:  Diagnosis Date  . Acromegaly ([place])   . Cancer ([place])   . History of pituitary tumor   . Knee pain, bilateral   . Testosterone deficiency   . Thyroid disease    Past Surgical History:  Procedure Laterality Date  . CARDIAC ELECTROPHYSIOLOGY STUDY AND ABLATION    . CERVICAL DISC SURGERY  2007   Anterior C4/5 vertebrectomy and c3-C6 laminoplasty with plating  . CRANIOTOMY FOR TUMOR     pituitary adenoma   Allergies  Allergen Reactions  . Oxycodone Nausea Only   Prior to Admission medications   Medication Sig Start Date End Date Taking? Authorizing Provider  atorvastatin (LIPITOR) 40 MG tablet Take 40 mg by mouth daily.   Yes [provider]  Cholecalciferol (VITAMIN D3) 25 MCG (1000 UT) CAPS Take 2,000 Units by mouth daily.    Yes [provider]  docusate sodium (COLACE) 250 MG capsule Take 250 mg by mouth daily.   Yes [provider]  eplerenone (INSPRA) 50 MG  tablet Take 50 mg by mouth daily.   Yes [provider]  ferrous sulfate 325 (65 FE) MG tablet Take 1 tablet (325 mg total) by mouth 2 (two) times daily with a meal. 10/05/14  Yes Love, Ivan Anchors, PA-C  hydrocortisone (CORTEF) 10 MG tablet Take 10 mg by mouth daily.    Yes [provider]  levothyroxine (SYNTHROID, LEVOTHROID) 200 MCG tablet Take 1 tablet (200 mcg total) by  mouth daily. 10/05/14  Yes Love, Ivan Anchors, PA-C  losartan (COZAAR) 25 MG tablet Take 25 mg by mouth daily.   Yes [provider]  metoprolol succinate (TOPROL-XL) 25 MG 24 hr tablet Take 12.5 mg by mouth daily.   Yes [provider]  Multiple Vitamins-Minerals (CENTRUM SILVER PO) Take 1 tablet by mouth daily.   Yes [provider]  naproxen sodium (ALEVE) 220 MG tablet Take 220-440 mg by mouth daily as needed (for pain or headache).   Yes [provider]  octreotide (SANDOSTATIN LAR) 30 MG injection Inject 30 mg into the muscle every 28 (twenty-eight) days.    Yes [provider]  testosterone cypionate (DEPOTESTOSTERONE CYPIONATE) 200 MG/ML injection Inject 1 mL (200 mg total) into the muscle every 14 (fourteen) days. 12/22/15  Yes [place] Honour, MD   Social History   Socioeconomic History  . Marital status: Single    Spouse name: Not on file  . Number of children: Not on file  . Years of education: Not on file  . Highest education level: Not on file  Occupational History  . Not on file  Social Needs  . Financial resource strain: Not on file  . Food insecurity:    Worry: Not on file    Inability: Not on file  . Transportation needs:    Medical: Not on file    Non-medical: Not on file  Tobacco Use  . Smoking status: Never Smoker  . Smokeless tobacco: Never Used  Substance and Sexual Activity  . Alcohol use: No  . Drug use: No  . Sexual activity: Not on file  Lifestyle  . Physical activity:    Days per week: Not on file    Minutes per session: Not on file  . Stress: Not on file  Relationships  . Social connections:    Talks on phone: Not on file    Gets together: Not on file    Attends religious service: Not on file    Active member of club or organization: Not on file    Attends meetings of clubs or organizations: Not on file    Relationship status: Not on file  . Intimate partner violence:    Fear of current or ex partner:  Not on file    Emotionally abused: Not on file    Physically abused: Not on file    Forced sexual activity: Not on file  Other Topics Concern  . Not on file  Social History Narrative  . Not on file    Review of Systems Hip pain, otherwise per HPI.      Objective:   Physical Exam Vitals signs reviewed.  Constitutional:      Appearance: He is well-developed.  HENT:     Head: Normocephalic and atraumatic.  Eyes:     Pupils: Pupils are equal, round, and reactive to light.  Neck:     Vascular: No carotid bruit or JVD.  Cardiovascular:     Rate and Rhythm: Normal rate and regular rhythm.  Heart sounds: Normal heart sounds. No murmur.  Pulmonary:     Effort: Pulmonary effort is normal.     Breath sounds: Normal breath sounds. No rales.  Skin:    General: Skin is warm and dry.  Neurological:     Mental Status: He is alert and oriented to person, place, and time.    Vitals:   03/04/18 1335  BP: 116/82  Pulse: 62  Resp: 14  Temp: (!) 97.4 F (36.3 C)  TempSrc: Oral  SpO2: 97%  Weight: 247 lb 9.6 oz (112.3 kg)  Height: 6\' 6"  (1.981 m)        Assessment & Plan:  Mark Gallagher is a 48 y.o. male Preop examination  -Cardiac clearance provided by cardiologist.  From a medical standpoint there are no apparent contraindications at present time for surgery.  Specific perioperative recommendations from endocrine discussed including stress steroids.  No neurologic concerns with the surgery.  Has tolerated anesthesia without difficulty in the past.  Did recommend perioperative monitoring of renal function and electrolytes, and I can check those at our office if needed.  Form will be completed and sent to surgeon.  Has preop testing pending in a few days  No orders of the defined types were placed in this encounter.  Patient Instructions   Cardiology clearance for your surgery as per your cardiologist.  From a medical standpoint, no specific restrictions or recommendations  from neurologic standpoint.  Endocrinologist did apparently provide some recommendations to your surgeon.  Part of that was monitoring kidney function and electrolytes after surgery.  I am happy to check some those tests here with a lab only visit if needed after your surgery.  Additionally I would recommend urine testing to rule out infection near the time of your surgery and can provide that testing here if needed.  I did not order blood work or testing today as it appears you are having some of that done in a few days.  Specifically I would make sure that your kidney function, electrolytes, and blood counts are okay.  Let me know if there are questions.    If you have lab work done today you will be contacted with your lab results within the next 2 weeks.  If you have not heard from [place] then please contact us. The fastest way to get your results is to register for My Chart.   IF you received an x-ray today, you will receive an invoice from [facility]. Please contact [facility] at [phone] with questions or concerns regarding your invoice.   IF you received labwork today, you will receive an invoice from [facility]. Please contact [facility] at [phone] with questions or concerns regarding your invoice.   Our billing staff will not be able to assist you with questions regarding bills from these companies.  You will be contacted with the lab results as soon as they are available. The fastest way to get your results is to activate your My Chart account. Instructions are located on the last page of this paperwork. If you have not heard from [place] regarding the results in 2 weeks, please contact this office.       Signed,   [name], MD [facility].  03/07/18 2:44 PM

## 2018-03-07 ENCOUNTER — Encounter: Payer: Self-pay | Admitting: Family Medicine

## 2018-03-08 NOTE — Progress Notes (Signed)
03-04-17 (Epic)Surgical Clearance from Dr. Jolyn Lent  02-04-18 Cardiac Clearance from Dr. Sheppard Coil on chart  02-03-18 Endocrinologist Clearance from Dr. Almyra Free on chart

## 2018-03-08 NOTE — Patient Instructions (Addendum)
Mark Gallagher  03/08/2018   Your procedure is scheduled on: 03-16-18    Report to [facility] Main  Entrance    Report to Admitting at 7:32 AM    Call this number if you have problems the morning of surgery [phone]    Remember: Do not eat food or drink liquids :After Midnight.    BRUSH YOUR TEETH MORNING OF SURGERY AND RINSE YOUR MOUTH OUT, NO CHEWING GUM CANDY OR MINTS.     Take these medicines the morning of surgery with A SIP OF WATER: Levothyroxine (Synthroid), and Metoprolol Succinate (Toprol-XL)                                You may not have any metal on your body including hair pins and              piercings  Do not wear jewelry, colgone, lotions, powders or deodorant             Men may shave face and neck.   Do not bring valuables to the hospital. [place].  Contacts, dentures or bridgework may not be worn into surgery.  Leave suitcase in the car. After surgery it may be brought to your room.     Patients discharged the day of surgery will not be allowed to drive home. IF YOU ARE HAVING SURGERY AND GOING HOME THE SAME DAY, YOU MUST HAVE AN ADULT TO DRIVE YOU HOME AND BE WITH YOU FOR 24 HOURS. YOU MAY GO HOME BY TAXI OR UBER OR ORTHERWISE, BUT AN ADULT MUST ACCOMPANY YOU HOME AND STAY WITH YOU FOR 24 HOURS.    Special Instructions: N/A              Please read over the following fact sheets you were given: _____________________________________________________________________   [facility] - Preparing for Surgery Before surgery, you can play an important role.  Because skin is not sterile, your skin needs to be as free of germs as possible.  You can reduce the number of germs on your skin by washing with CHG (chlorahexidine gluconate) soap before surgery.  CHG is an antiseptic cleaner which kills germs and bonds with the skin to continue killing germs even after washing. Please DO NOT use  if you have an allergy to CHG or antibacterial soaps.  If your skin becomes reddened/irritated stop using the CHG and inform your nurse when you arrive at Short Stay. Do not shave (including legs and underarms) for at least 48 hours prior to the first CHG shower.  You may shave your face/neck. Please follow these instructions carefully:  1.  Shower with CHG Soap the night before surgery and the  morning of Surgery.  2.  If you choose to wash your hair, wash your hair first as usual with your  normal  shampoo.  3.  After you shampoo, rinse your hair and body thoroughly to remove the  shampoo.                           4.  Use CHG as you would any other liquid soap.  You can apply chg directly  to the skin and wash  Gently with a scrungie or clean washcloth.  5.  Apply the CHG Soap to your body ONLY FROM THE NECK DOWN.   Do not use on face/ open                           Wound or open sores. Avoid contact with eyes, ears mouth and genitals (private parts).                       Wash face,  Genitals (private parts) with your normal soap.             6.  Wash thoroughly, paying special attention to the area where your surgery  will be performed.  7.  Thoroughly rinse your body with warm water from the neck down.  8.  DO NOT shower/wash with your normal soap after using and rinsing off  the CHG Soap.                9.  Pat yourself dry with a clean towel.            10.  Wear clean pajamas.            11.  Place clean sheets on your bed the night of your first shower and do not  sleep with pets. Day of Surgery : Do not apply any lotions/deodorants the morning of surgery.  Please wear clean clothes to the hospital/surgery center.  FAILURE TO FOLLOW THESE INSTRUCTIONS MAY RESULT IN THE CANCELLATION OF YOUR SURGERY PATIENT SIGNATURE_________________________________  NURSE  SIGNATURE__________________________________  ________________________________________________________________________             Mark Gallagher  An incentive spirometer is a tool that can help keep your lungs clear and active. This tool measures how well you are filling your lungs with each breath. Taking long deep breaths may help reverse or decrease the chance of developing breathing (pulmonary) problems (especially infection) following:  A long period of time when you are unable to move or be active. BEFORE THE PROCEDURE   If the spirometer includes an indicator to show your best effort, your nurse or respiratory therapist will set it to a desired goal.  If possible, sit up straight or lean slightly forward. Try not to slouch.  Hold the incentive spirometer in an upright position. INSTRUCTIONS FOR USE  1. Sit on the edge of your bed if possible, or sit up as far as you can in bed or on a chair. 2. Hold the incentive spirometer in an upright position. 3. Breathe out normally. 4. Place the mouthpiece in your mouth and seal your lips tightly around it. 5. Breathe in slowly and as deeply as possible, raising the piston or the ball toward the top of the column. 6. Hold your breath for 3-5 seconds or for as long as possible. Allow the piston or ball to fall to the bottom of the column. 7. Remove the mouthpiece from your mouth and breathe out normally. 8. Rest for a few seconds and repeat Steps 1 through 7 at least 10 times every 1-2 hours when you are awake. Take your time and take a few normal breaths between deep breaths. 9. The spirometer may include an indicator to show your best effort. Use the indicator as a goal to work toward during each repetition. 10. After each set of 10 deep breaths, practice coughing to be sure your lungs are clear. If you have  an incision (the cut made at the time of surgery), support your incision when coughing by placing a pillow or rolled up towels  firmly against it. Once you are able to get out of bed, walk around indoors and cough well. You may stop using the incentive spirometer when instructed by your caregiver.  RISKS AND COMPLICATIONS  Take your time so you do not get dizzy or light-headed.  If you are in pain, you may need to take or ask for pain medication before doing incentive spirometry. It is harder to take a deep breath if you are having pain. AFTER USE  Rest and breathe slowly and easily.  It can be helpful to keep track of a log of your progress. Your caregiver can provide you with a simple table to help with this. If you are using the spirometer at home, follow these instructions: [place] IF:   You are having difficultly using the spirometer.  You have trouble using the spirometer as often as instructed.  Your pain medication is not giving enough relief while using the spirometer.  You develop fever of 100.5 F (38.1 C) or higher. SEEK IMMEDIATE MEDICAL CARE IF:   You cough up bloody sputum that had not been present before.  You develop fever of 102 F (38.9 C) or greater.  You develop worsening pain at or near the incision site. MAKE SURE YOU:   Understand these instructions.  Will watch your condition.  Will get help right away if you are not doing well or get worse. Document Released: 05/19/2006 Document Revised: 03/31/2011 Document Reviewed: 07/20/2006 [employer] Patient Information 2014 [place], [place].   ________________________________________________________________________

## 2018-03-09 ENCOUNTER — Encounter (HOSPITAL_COMMUNITY): Payer: Self-pay

## 2018-03-09 ENCOUNTER — Other Ambulatory Visit (HOSPITAL_COMMUNITY): Payer: BLUE CROSS/BLUE SHIELD

## 2018-03-09 ENCOUNTER — Other Ambulatory Visit: Payer: Self-pay

## 2018-03-09 ENCOUNTER — Encounter (HOSPITAL_COMMUNITY)
Admission: RE | Admit: 2018-03-09 | Discharge: 2018-03-09 | Disposition: A | Discharge status: Home / Self Care | Payer: BLUE CROSS/BLUE SHIELD | Source: Ambulatory Visit | Attending: Orthopedic Surgery | Admitting: Orthopedic Surgery

## 2018-03-09 DIAGNOSIS — I4510 Unspecified right bundle-branch block: Secondary | ICD-10-CM | POA: Insufficient documentation

## 2018-03-09 DIAGNOSIS — Z01818 Encounter for other preprocedural examination: Secondary | ICD-10-CM | POA: Diagnosis not present

## 2018-03-09 DIAGNOSIS — R9431 Abnormal electrocardiogram [ECG] [EKG]: Secondary | ICD-10-CM | POA: Diagnosis not present

## 2018-03-09 DIAGNOSIS — I10 Essential (primary) hypertension: Secondary | ICD-10-CM | POA: Diagnosis not present

## 2018-03-09 DIAGNOSIS — M1611 Unilateral primary osteoarthritis, right hip: Secondary | ICD-10-CM | POA: Diagnosis not present

## 2018-03-09 HISTORY — DX: Pneumonia, unspecified organism: J18.9

## 2018-03-09 HISTORY — DX: Essential (primary) hypertension: I10

## 2018-03-09 HISTORY — DX: Unspecified osteoarthritis, unspecified site: M19.90

## 2018-03-09 HISTORY — DX: Hypothyroidism, unspecified: E03.9

## 2018-03-09 LAB — BASIC METABOLIC PANEL
Anion gap: 9 (ref 5–15)
BUN: 21 mg/dL — ABNORMAL HIGH (ref 6–20)
CO2: 25 mmol/L (ref 22–32)
Calcium: 9.2 mg/dL (ref 8.9–10.3)
Chloride: 104 mmol/L (ref 98–111)
Creatinine, Ser: 0.66 mg/dL (ref 0.61–1.24)
GFR calc Af Amer: >60 mL/min (ref >60)
GFR calc non Af Amer: >60 mL/min (ref >60)
Glucose, Bld: 128 mg/dL — ABNORMAL HIGH (ref 70–99)
Potassium: 3.7 mmol/L (ref 3.5–5.1)
Sodium: 138 mmol/L (ref 135–145)

## 2018-03-09 LAB — CBC
HCT: 39.8 % (ref 39.0–52.0)
Hemoglobin: 13.1 g/dL (ref 13.0–17.0)
MCH: 32.3 pg (ref 26.0–34.0)
MCHC: 32.9 g/dL (ref 30.0–36.0)
MCV: 98.3 fL (ref 80.0–100.0)
Platelets: 148 10*3/uL — ABNORMAL LOW (ref 150–400)
RBC: 4.05 MIL/uL — ABNORMAL LOW (ref 4.22–5.81)
RDW: 12.1 % (ref 11.5–15.5)
WBC: 4.6 10*3/uL (ref 4.0–10.5)
nRBC: 0 % (ref 0.0–0.2)

## 2018-03-09 LAB — SURGICAL PCR SCREEN
MRSA, PCR: NEGATIVE
Staphylococcus aureus: POSITIVE — AB

## 2018-03-10 NOTE — Progress Notes (Signed)
PCP: Dr. Deborra Medina  CARDIOLOGIST:Dr. Sheppard Coil  INFO IN Epic:  03-04-17 (Epic)Surgical Clearance from Dr. Jolyn Lent  INFO ON CHART:  02-04-18 Cardiac Clearance from Dr. Sheppard Coil on chart  02-03-18 Endocrinologist Clearance from Dr. Almyra Free on chart  BLOOD THINNERS AND LAST DOSES: ____________________________________  PATIENT SYMPTOMS AT TIME OF PREOP:  Abnormal EKG

## 2018-03-10 NOTE — Progress Notes (Signed)
Attempted to contact patient x 3, at number provided by patient ([phone])  to advise that PCR swab was positive for Staph. Phone rung, and it appeared that someone picked up, but would not respond. So, unable to leave pt a voice message.  Mupirocin prescription called to [facility], [place], [place]. If prescription is not picked up. Pt will be treated on day of surgery.

## 2018-03-11 NOTE — Progress Notes (Addendum)
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 Edmonia Lynch.   Pt last seen by cardiology on 05/01/17.  Seen by Darnelle Catalan, PA-C. Cardiac clearance dated 02/04/18 on chart.  Per Dr. Pleas Patricia pt is optimized for surgery from a cardiac standpoint only.  "His cardiac issues do not preclude his ortho surgery.  He is followed much more closely by endocrine, Manley Mason, His electrolytes and renal fxn should be monitored closely."   Clearance received from Endocrinologist Dr. Almyra Free on 02/18/2018. Per her note pt is optimized for surgery from an endocrine standpoint only.  "He will require stress dose steroids prior to his surgery."  Discussed with Dr. Daiva Huge.   Pt can proceed with planned procedure barring acute status change.  VS: BP 118/76   Pulse 75   Temp 36.4 C (Oral)   Resp 16   Ht 6\' 7"  (2.007 m)   Wt 109.8 kg   SpO2 98%   BMI 27.26 kg/m   PROVIDERS: Wendie Agreste, MD is PCP   Pleas Patricia, MD is Cardiologist   Jerl Mina, MD is Endocrinologist  LABS: Labs reviewed: Acceptable for surgery. (all labs ordered are listed, but only abnormal results are displayed)  Labs Reviewed  SURGICAL PCR SCREEN - Abnormal; Notable for the following components:      Result Value   Staphylococcus aureus POSITIVE (*)    All other components within normal limits  BASIC METABOLIC PANEL - Abnormal; Notable for the following components:   Glucose, Bld 128 (*)    BUN 21 (*)    All other  components within normal limits  CBC - Abnormal; Notable for the following components:   RBC 4.05 (*)    Platelets 148 (*)    All other components within normal limits     IMAGES:   EKG: 03/09/2018 Rate 89 Sinus rhythm with Premature supraventricular complexes and frequent and consecutive premature ventricular complexes Right bundle branch block Since last tracing RBBB unchanged but polymorphic PVC's new  CV: Echo 11/05/16 (on Care Everywhere) LV ejection fraction 50% MILD LV DYSFUNCTION (See above) WITH MODERATE LVH   NORMAL LA PRESSURES WITH DIASTOLIC DYSFUNCTION   NORMAL RIGHT VENTRICULAR SYSTOLIC FUNCTION   VALVULAR REGURGITATION: MILD MR, TRIVIAL PR, TRIVIAL TR   FREQUENT VENTRICULAR ECTOPY DURING EXAM Past Medical History:  Diagnosis Date  . Acromegaly ([place])   . Arthritis   . Cancer ([place])   . History of pituitary tumor   . Hypertension   . Hypothyroidism   . Knee pain, bilateral   . Pneumonia   . Testosterone deficiency   . Thyroid disease     Past Surgical History:  Procedure Laterality Date  . CARDIAC ELECTROPHYSIOLOGY STUDY AND ABLATION    . CERVICAL DISC SURGERY  2007   Anterior C4/5 vertebrectomy and c3-C6 laminoplasty with plating  . CRANIOTOMY FOR TUMOR     pituitary adenoma    MEDICATIONS: . atorvastatin (LIPITOR) 40 MG tablet  . Cholecalciferol (VITAMIN D3) 25  MCG (1000 UT) CAPS  . docusate sodium (COLACE) 250 MG capsule  . eplerenone (INSPRA) 50 MG tablet  . ferrous sulfate 325 (65 FE) MG tablet  . hydrocortisone (CORTEF) 10 MG tablet  . levothyroxine (SYNTHROID, LEVOTHROID) 200 MCG tablet  . losartan (COZAAR) 25 MG tablet  . metoprolol succinate (TOPROL-XL) 25 MG 24 hr tablet  . Multiple Vitamins-Minerals (CENTRUM SILVER PO)  . naproxen sodium (ALEVE) 220 MG tablet  . octreotide (SANDOSTATIN LAR) 30 MG injection  . testosterone cypionate (DEPOTESTOSTERONE CYPIONATE) 200 MG/ML injection   . octreotide (SANDOSTATIN LAR) IM injection 30 mg   . octreotide (SANDOSTATIN LAR) IM injection 30 mg  . octreotide (SANDOSTATIN LAR) IM injection 30 mg  . testosterone cypionate (DEPOTESTOSTERONE CYPIONATE) injection 100 mg  . testosterone cypionate (DEPOTESTOSTERONE CYPIONATE) injection 100 mg  . testosterone cypionate (DEPOTESTOSTERONE CYPIONATE) injection 200 mg    Maia Plan [facility] Pre-Surgical Testing [phone] 03/11/18 2:35 PM

## 2018-03-11 NOTE — Anesthesia Preprocedure Evaluation (Addendum)
Anesthesia Evaluation  Patient identified by MRN, date of birth, ID band Patient awake    Reviewed: Allergy & Precautions, NPO status , Patient's Chart, lab work & pertinent test results  History of Anesthesia Complications Negative for: history of anesthetic complications  Airway Mallampati: III  TM Distance: >3 FB Neck ROM: Limited    Dental  (+) Teeth Intact   Pulmonary neg shortness of breath, neg sleep apnea, neg COPD, neg recent URI,    breath sounds clear to auscultation       Cardiovascular hypertension, Pt. on medications and Pt. on home beta blockers (-) CHF  Rhythm:Regular  Wolff-Parkinson-White (WPW) syndrome s/p ablation   Neuro/Psych PSYCHIATRIC DISORDERS Spinal stenosis with sciatic distribution pain  Neuromuscular disease    GI/Hepatic negative GI ROS, Neg liver ROS,   Endo/Other  Hypothyroidism Acromegaly s/p pituitary resection, on hormone replacement  Renal/GU      Musculoskeletal  (+) Arthritis ,   Abdominal   Peds  Hematology negative hematology ROS (+)   Anesthesia Other Findings 59m Hydrocortisone x1  Reproductive/Obstetrics                            Anesthesia Physical Anesthesia Plan  ASA: III  Anesthesia Plan: MAC and Spinal   Post-op Pain Management:    Induction:   PONV Risk Score and Plan: 1 and Treatment may vary due to age or medical condition and Propofol infusion  Airway Management Planned: Nasal Cannula  Additional Equipment: None  Intra-op Plan:   Post-operative Plan:   Informed Consent: I have reviewed the patients History and Physical, chart, labs and discussed the procedure including the risks, benefits and alternatives for the proposed anesthesia with the patient or authorized representative who has indicated his/her understanding and acceptance.     Dental advisory given  Plan Discussed with: CRNA and Surgeon  Anesthesia Plan  Comments: (See PAT note 03/09/18, JKonrad Felix PA-C)       Anesthesia Quick Evaluation

## 2018-03-12 ENCOUNTER — Ambulatory Visit (INDEPENDENT_AMBULATORY_CARE_PROVIDER_SITE_OTHER): Payer: BLUE CROSS/BLUE SHIELD | Admitting: Family Medicine

## 2018-03-12 VITALS — BP 107/57 | HR 57 | Temp 98.3°F | Resp 16

## 2018-03-12 DIAGNOSIS — E291 Testicular hypofunction: Secondary | ICD-10-CM | POA: Diagnosis not present

## 2018-03-12 DIAGNOSIS — E220 Acromegaly and pituitary gigantism: Secondary | ICD-10-CM

## 2018-03-12 MED ORDER — OCTREOTIDE ACETATE 30 MG IM KIT
30.0000 mg | PACK | Freq: Once | INTRAMUSCULAR | Status: AC
  Administered 2018-03-12: 30 mg via INTRAMUSCULAR

## 2018-03-12 MED ORDER — TESTOSTERONE CYPIONATE 200 MG/ML IM SOLN
100.0000 mg | Freq: Once | INTRAMUSCULAR | Status: AC
  Administered 2018-03-12: 100 mg via INTRAMUSCULAR

## 2018-03-12 NOTE — Progress Notes (Signed)
Pt here for Sandostatin LAR 30 mg injection given in right upper quadrant. I attempted to inject the left upper outer quadrant initially, but it wouldn't go in maybe scar tissue involved. I had to come out and change needle and syringe. I went and got Caren Griffins, CMA for assistance and at that time she changed out needle and was able to inject in the upper right quadrant.  Testosterone Cypionate 200mg /ml given in right lower quadrant.

## 2018-03-15 MED ORDER — BUPIVACAINE LIPOSOME 1.3 % IJ SUSP
20.0000 mL | Freq: Once | INTRAMUSCULAR | Status: DC
  Filled 2018-03-15: qty 20

## 2018-03-16 ENCOUNTER — Inpatient Hospital Stay (HOSPITAL_COMMUNITY): Payer: BLUE CROSS/BLUE SHIELD | Admitting: Certified Registered Nurse Anesthetist

## 2018-03-16 ENCOUNTER — Inpatient Hospital Stay (HOSPITAL_COMMUNITY): Payer: BLUE CROSS/BLUE SHIELD | Admitting: Physician Assistant

## 2018-03-16 ENCOUNTER — Inpatient Hospital Stay (HOSPITAL_COMMUNITY): Payer: BLUE CROSS/BLUE SHIELD

## 2018-03-16 ENCOUNTER — Encounter (HOSPITAL_COMMUNITY)
Admission: RE | Disposition: A | Discharge status: Home Health | Payer: Self-pay | Source: Home / Self Care | Attending: Orthopedic Surgery

## 2018-03-16 ENCOUNTER — Encounter (HOSPITAL_COMMUNITY): Payer: Self-pay | Admitting: *Deleted

## 2018-03-16 ENCOUNTER — Inpatient Hospital Stay (HOSPITAL_COMMUNITY)
Admission: RE | Admit: 2018-03-16 | Discharge: 2018-03-23 | DRG: 469 | Disposition: A | Discharge status: Home Health | Payer: BLUE CROSS/BLUE SHIELD | Attending: Orthopedic Surgery | Admitting: Orthopedic Surgery

## 2018-03-16 DIAGNOSIS — E079 Disorder of thyroid, unspecified: Secondary | ICD-10-CM | POA: Diagnosis present

## 2018-03-16 DIAGNOSIS — Z885 Allergy status to narcotic agent status: Secondary | ICD-10-CM | POA: Diagnosis not present

## 2018-03-16 DIAGNOSIS — E291 Testicular hypofunction: Secondary | ICD-10-CM

## 2018-03-16 DIAGNOSIS — M1611 Unilateral primary osteoarthritis, right hip: Secondary | ICD-10-CM | POA: Diagnosis not present

## 2018-03-16 DIAGNOSIS — D352 Benign neoplasm of pituitary gland: Secondary | ICD-10-CM | POA: Diagnosis present

## 2018-03-16 DIAGNOSIS — R579 Shock, unspecified: Secondary | ICD-10-CM

## 2018-03-16 DIAGNOSIS — R609 Edema, unspecified: Secondary | ICD-10-CM | POA: Diagnosis not present

## 2018-03-16 DIAGNOSIS — Z471 Aftercare following joint replacement surgery: Secondary | ICD-10-CM | POA: Diagnosis not present

## 2018-03-16 DIAGNOSIS — Z96641 Presence of right artificial hip joint: Secondary | ICD-10-CM | POA: Diagnosis not present

## 2018-03-16 DIAGNOSIS — T380X5A Adverse effect of glucocorticoids and synthetic analogues, initial encounter: Secondary | ICD-10-CM | POA: Diagnosis present

## 2018-03-16 DIAGNOSIS — Z981 Arthrodesis status: Secondary | ICD-10-CM

## 2018-03-16 DIAGNOSIS — E2740 Unspecified adrenocortical insufficiency: Secondary | ICD-10-CM | POA: Diagnosis present

## 2018-03-16 DIAGNOSIS — M21372 Foot drop, left foot: Secondary | ICD-10-CM | POA: Diagnosis present

## 2018-03-16 DIAGNOSIS — I110 Hypertensive heart disease with heart failure: Secondary | ICD-10-CM | POA: Diagnosis not present

## 2018-03-16 DIAGNOSIS — K5903 Drug induced constipation: Secondary | ICD-10-CM | POA: Diagnosis not present

## 2018-03-16 DIAGNOSIS — I5022 Chronic systolic (congestive) heart failure: Secondary | ICD-10-CM | POA: Diagnosis not present

## 2018-03-16 DIAGNOSIS — T8111XA Postprocedural  cardiogenic shock, initial encounter: Secondary | ICD-10-CM | POA: Diagnosis not present

## 2018-03-16 DIAGNOSIS — Z96649 Presence of unspecified artificial hip joint: Secondary | ICD-10-CM

## 2018-03-16 DIAGNOSIS — D509 Iron deficiency anemia, unspecified: Secondary | ICD-10-CM | POA: Diagnosis present

## 2018-03-16 DIAGNOSIS — Z9889 Other specified postprocedural states: Secondary | ICD-10-CM

## 2018-03-16 DIAGNOSIS — Z79891 Long term (current) use of opiate analgesic: Secondary | ICD-10-CM | POA: Diagnosis not present

## 2018-03-16 DIAGNOSIS — I10 Essential (primary) hypertension: Secondary | ICD-10-CM | POA: Diagnosis not present

## 2018-03-16 DIAGNOSIS — E220 Acromegaly and pituitary gigantism: Secondary | ICD-10-CM | POA: Diagnosis present

## 2018-03-16 DIAGNOSIS — R102 Pelvic and perineal pain: Secondary | ICD-10-CM | POA: Diagnosis not present

## 2018-03-16 DIAGNOSIS — R739 Hyperglycemia, unspecified: Secondary | ICD-10-CM | POA: Diagnosis present

## 2018-03-16 DIAGNOSIS — Z7982 Long term (current) use of aspirin: Secondary | ICD-10-CM

## 2018-03-16 DIAGNOSIS — Z79890 Hormone replacement therapy (postmenopausal): Secondary | ICD-10-CM

## 2018-03-16 DIAGNOSIS — D696 Thrombocytopenia, unspecified: Secondary | ICD-10-CM | POA: Diagnosis present

## 2018-03-16 DIAGNOSIS — E230 Hypopituitarism: Secondary | ICD-10-CM | POA: Diagnosis present

## 2018-03-16 DIAGNOSIS — D62 Acute posthemorrhagic anemia: Secondary | ICD-10-CM

## 2018-03-16 DIAGNOSIS — I471 Supraventricular tachycardia: Secondary | ICD-10-CM

## 2018-03-16 DIAGNOSIS — I4719 Other supraventricular tachycardia: Secondary | ICD-10-CM

## 2018-03-16 DIAGNOSIS — E039 Hypothyroidism, unspecified: Secondary | ICD-10-CM | POA: Diagnosis not present

## 2018-03-16 DIAGNOSIS — G709 Myoneural disorder, unspecified: Secondary | ICD-10-CM | POA: Diagnosis not present

## 2018-03-16 DIAGNOSIS — T40605A Adverse effect of unspecified narcotics, initial encounter: Secondary | ICD-10-CM | POA: Diagnosis not present

## 2018-03-16 DIAGNOSIS — Z806 Family history of leukemia: Secondary | ICD-10-CM

## 2018-03-16 DIAGNOSIS — M1610 Unilateral primary osteoarthritis, unspecified hip: Secondary | ICD-10-CM | POA: Diagnosis present

## 2018-03-16 DIAGNOSIS — K5900 Constipation, unspecified: Secondary | ICD-10-CM

## 2018-03-16 DIAGNOSIS — M25559 Pain in unspecified hip: Secondary | ICD-10-CM

## 2018-03-16 HISTORY — PX: TOTAL HIP ARTHROPLASTY: SHX124

## 2018-03-16 LAB — CBC
HCT: 32.1 % — ABNORMAL LOW (ref 39.0–52.0)
Hemoglobin: 10.1 g/dL — ABNORMAL LOW (ref 13.0–17.0)
MCH: 31.9 pg (ref 26.0–34.0)
MCHC: 31.5 g/dL (ref 30.0–36.0)
MCV: 101.3 fL — ABNORMAL HIGH (ref 80.0–100.0)
Platelets: 127 10*3/uL — ABNORMAL LOW (ref 150–400)
RBC: 3.17 MIL/uL — ABNORMAL LOW (ref 4.22–5.81)
RDW: 11.9 % (ref 11.5–15.5)
WBC: 4.7 10*3/uL (ref 4.0–10.5)
nRBC: 0 % (ref 0.0–0.2)

## 2018-03-16 LAB — GLUCOSE, CAPILLARY
Glucose-Capillary: 90 mg/dL (ref 70–99)
Glucose-Capillary: 159 mg/dL — ABNORMAL HIGH (ref 70–99)

## 2018-03-16 LAB — BASIC METABOLIC PANEL
Anion gap: 9 (ref 5–15)
BUN: 21 mg/dL — ABNORMAL HIGH (ref 6–20)
CO2: 23 mmol/L (ref 22–32)
Calcium: 8.4 mg/dL — ABNORMAL LOW (ref 8.9–10.3)
Chloride: 107 mmol/L (ref 98–111)
Creatinine, Ser: 0.56 mg/dL — ABNORMAL LOW (ref 0.61–1.24)
GFR calc Af Amer: >60 mL/min (ref >60)
GFR calc non Af Amer: >60 mL/min (ref >60)
Glucose, Bld: 140 mg/dL — ABNORMAL HIGH (ref 70–99)
Potassium: 3.5 mmol/L (ref 3.5–5.1)
Sodium: 139 mmol/L (ref 135–145)

## 2018-03-16 LAB — CBC WITH DIFFERENTIAL/PLATELET
Abs Immature Granulocytes: 0 10*3/uL (ref 0.00–0.07)
Band Neutrophils: 11 %
Basophils Absolute: 0 10*3/uL (ref 0.0–0.1)
Basophils Relative: 0 %
Eosinophils Absolute: 0 10*3/uL (ref 0.0–0.5)
Eosinophils Relative: 0 %
HCT: 31.8 % — ABNORMAL LOW (ref 39.0–52.0)
Hemoglobin: 10.6 g/dL — ABNORMAL LOW (ref 13.0–17.0)
Lymphocytes Relative: 6 %
Lymphs Abs: 0.5 10*3/uL — ABNORMAL LOW (ref 0.7–4.0)
MCH: 32.2 pg (ref 26.0–34.0)
MCHC: 33.3 g/dL (ref 30.0–36.0)
MCV: 96.7 fL (ref 80.0–100.0)
Monocytes Absolute: 0.3 10*3/uL (ref 0.1–1.0)
Monocytes Relative: 4 %
Neutro Abs: 7.7 10*3/uL (ref 1.7–7.7)
Neutrophils Relative %: 79 %
Platelets: 128 10*3/uL — ABNORMAL LOW (ref 150–400)
RBC: 3.29 MIL/uL — ABNORMAL LOW (ref 4.22–5.81)
RDW: 13.6 % (ref 11.5–15.5)
WBC: 8.6 10*3/uL (ref 4.0–10.5)
nRBC: 0 % (ref 0.0–0.2)

## 2018-03-16 LAB — HEPATIC FUNCTION PANEL
ALT: 17 U/L (ref 0–44)
AST: 13 U/L — ABNORMAL LOW (ref 15–41)
Albumin: 3.9 g/dL (ref 3.5–5.0)
Alkaline Phosphatase: 40 U/L (ref 38–126)
Bilirubin, Direct: 0.7 mg/dL — ABNORMAL HIGH (ref 0.0–0.2)
Indirect Bilirubin: 2.3 mg/dL — ABNORMAL HIGH (ref 0.3–0.9)
Total Bilirubin: 3 mg/dL — ABNORMAL HIGH (ref 0.3–1.2)
Total Protein: 5.7 g/dL — ABNORMAL LOW (ref 6.5–8.1)

## 2018-03-16 LAB — TROPONIN I: Troponin I: <0.03 ng/mL (ref <0.03)

## 2018-03-16 LAB — PROTIME-INR
INR: 1.2 (ref 0.8–1.2)
Prothrombin Time: 15.3 seconds — ABNORMAL HIGH (ref 11.4–15.2)

## 2018-03-16 LAB — LACTIC ACID, PLASMA: Lactic Acid, Venous: 1 mmol/L (ref 0.5–1.9)

## 2018-03-16 LAB — PREPARE RBC (CROSSMATCH)

## 2018-03-16 SURGERY — ARTHROPLASTY, HIP, TOTAL, ANTERIOR APPROACH
Anesthesia: Monitor Anesthesia Care | Site: Hip | Laterality: Right

## 2018-03-16 MED ORDER — ONDANSETRON HCL 4 MG/2ML IJ SOLN
4.0000 mg | Freq: Four times a day (QID) | INTRAMUSCULAR | Status: DC | PRN
  Administered 2018-03-17: 4 mg via INTRAVENOUS
  Filled 2018-03-16: qty 2

## 2018-03-16 MED ORDER — LACTATED RINGERS IV SOLN
INTRAVENOUS | Status: DC
  Administered 2018-03-16 (×4): via INTRAVENOUS

## 2018-03-16 MED ORDER — ACETAMINOPHEN 500 MG PO TABS
500.0000 mg | ORAL_TABLET | Freq: Four times a day (QID) | ORAL | Status: AC
  Administered 2018-03-16 – 2018-03-17 (×4): 500 mg via ORAL
  Filled 2018-03-16 (×4): qty 1

## 2018-03-16 MED ORDER — PROPOFOL 10 MG/ML IV BOLUS
INTRAVENOUS | Status: DC | PRN
  Administered 2018-03-16: 10 mg via INTRAVENOUS
  Administered 2018-03-16 (×2): 20 mg via INTRAVENOUS

## 2018-03-16 MED ORDER — CEFAZOLIN SODIUM-DEXTROSE 2-4 GM/100ML-% IV SOLN
2.0000 g | INTRAVENOUS | Status: AC
  Administered 2018-03-16: 2 g via INTRAVENOUS
  Filled 2018-03-16: qty 100

## 2018-03-16 MED ORDER — LOSARTAN POTASSIUM 25 MG PO TABS: 25.0000 mg | ORAL_TABLET | Freq: Every day | ORAL | Status: DC

## 2018-03-16 MED ORDER — ACETAMINOPHEN 160 MG/5ML PO SOLN: 1000.0000 mg | Freq: Once | ORAL | Status: DC | PRN

## 2018-03-16 MED ORDER — PROPOFOL 10 MG/ML IV BOLUS
INTRAVENOUS | Status: AC
  Filled 2018-03-16: qty 20

## 2018-03-16 MED ORDER — 0.9 % SODIUM CHLORIDE (POUR BTL) OPTIME
TOPICAL | Status: DC | PRN
  Administered 2018-03-16: 1000 mL

## 2018-03-16 MED ORDER — SPIRONOLACTONE 25 MG PO TABS
25.0000 mg | ORAL_TABLET | Freq: Every day | ORAL | Status: DC
  Administered 2018-03-17 – 2018-03-18 (×2): 25 mg via ORAL
  Filled 2018-03-16 (×2): qty 1

## 2018-03-16 MED ORDER — HYDROCODONE-ACETAMINOPHEN 7.5-325 MG PO TABS: 1.0000 | ORAL_TABLET | Freq: Four times a day (QID) | ORAL | 0 refills | Status: AC | PRN

## 2018-03-16 MED ORDER — LIDOCAINE 2% (20 MG/ML) 5 ML SYRINGE
INTRAMUSCULAR | Status: AC
  Filled 2018-03-16: qty 5

## 2018-03-16 MED ORDER — MIDAZOLAM HCL 5 MG/5ML IJ SOLN
INTRAMUSCULAR | Status: DC | PRN
  Administered 2018-03-16: 2 mg via INTRAVENOUS

## 2018-03-16 MED ORDER — KETOROLAC TROMETHAMINE 15 MG/ML IJ SOLN
15.0000 mg | Freq: Four times a day (QID) | INTRAMUSCULAR | Status: AC
  Administered 2018-03-16 – 2018-03-17 (×4): 15 mg via INTRAVENOUS
  Filled 2018-03-16 (×4): qty 1

## 2018-03-16 MED ORDER — PROPOFOL 10 MG/ML IV BOLUS
INTRAVENOUS | Status: AC
  Filled 2018-03-16: qty 40

## 2018-03-16 MED ORDER — ATORVASTATIN CALCIUM 40 MG PO TABS
40.0000 mg | ORAL_TABLET | Freq: Every day | ORAL | Status: DC
  Administered 2018-03-16 – 2018-03-22 (×7): 40 mg via ORAL
  Filled 2018-03-16 (×7): qty 1

## 2018-03-16 MED ORDER — FENTANYL CITRATE (PF) 100 MCG/2ML IJ SOLN
INTRAMUSCULAR | Status: AC
  Filled 2018-03-16: qty 2

## 2018-03-16 MED ORDER — STERILE WATER FOR IRRIGATION IR SOLN
Status: DC | PRN
  Administered 2018-03-16: 2000 mL

## 2018-03-16 MED ORDER — PHENOL 1.4 % MT LIQD: 1.0000 | OROMUCOSAL | Status: DC | PRN

## 2018-03-16 MED ORDER — SODIUM CHLORIDE (PF) 0.9 % IJ SOLN
INTRAMUSCULAR | Status: AC
  Filled 2018-03-16: qty 50

## 2018-03-16 MED ORDER — POLYETHYLENE GLYCOL 3350 17 G PO PACK
17.0000 g | PACK | Freq: Every day | ORAL | Status: DC | PRN
  Administered 2018-03-19: 17 g via ORAL
  Filled 2018-03-16 (×2): qty 1

## 2018-03-16 MED ORDER — HYDROCORTISONE NA SUCCINATE PF 100 MG IJ SOLR
INTRAMUSCULAR | Status: DC | PRN
  Administered 2018-03-16: 50 mg via INTRAVENOUS

## 2018-03-16 MED ORDER — MAGNESIUM CITRATE PO SOLN: 1.0000 | Freq: Once | ORAL | Status: DC | PRN

## 2018-03-16 MED ORDER — PHENYLEPHRINE HCL-NACL 10-0.9 MG/250ML-% IV SOLN
INTRAVENOUS | Status: AC
  Administered 2018-03-16: 20 ug/min via INTRAVENOUS
  Filled 2018-03-16: qty 250

## 2018-03-16 MED ORDER — PHENYLEPHRINE HCL 10 MG/ML IJ SOLN
INTRAMUSCULAR | Status: AC
  Filled 2018-03-16: qty 1

## 2018-03-16 MED ORDER — FERROUS SULFATE 325 (65 FE) MG PO TABS
325.0000 mg | ORAL_TABLET | Freq: Two times a day (BID) | ORAL | Status: DC
  Administered 2018-03-17 – 2018-03-23 (×13): 325 mg via ORAL
  Filled 2018-03-16 (×13): qty 1

## 2018-03-16 MED ORDER — FENTANYL CITRATE (PF) 100 MCG/2ML IJ SOLN
25.0000 ug | INTRAMUSCULAR | Status: DC | PRN
  Administered 2018-03-16 (×3): 50 ug via INTRAVENOUS

## 2018-03-16 MED ORDER — HYDROCORTISONE NA SUCCINATE PF 100 MG IJ SOLR
50.0000 mg | Freq: Every day | INTRAMUSCULAR | Status: DC
  Filled 2018-03-16: qty 2

## 2018-03-16 MED ORDER — ACETAMINOPHEN 10 MG/ML IV SOLN: 1000.0000 mg | Freq: Once | INTRAVENOUS | Status: DC | PRN

## 2018-03-16 MED ORDER — OXYCODONE HCL 5 MG PO TABS: 5.0000 mg | ORAL_TABLET | Freq: Once | ORAL | Status: DC | PRN

## 2018-03-16 MED ORDER — ALBUMIN HUMAN 5 % IV SOLN
12.5000 g | Freq: Once | INTRAVENOUS | Status: AC
  Administered 2018-03-16: 12.5 g via INTRAVENOUS

## 2018-03-16 MED ORDER — HYDROCODONE-ACETAMINOPHEN 7.5-325 MG PO TABS
1.0000 | ORAL_TABLET | ORAL | Status: DC | PRN
  Administered 2018-03-16: 1 via ORAL
  Administered 2018-03-17 (×2): 2 via ORAL
  Administered 2018-03-19 – 2018-03-20 (×4): 1 via ORAL
  Administered 2018-03-21: 2 via ORAL
  Administered 2018-03-21 (×3): 1 via ORAL
  Administered 2018-03-22: 2 via ORAL
  Administered 2018-03-23 (×2): 1 via ORAL
  Filled 2018-03-16 (×2): qty 1
  Filled 2018-03-16: qty 2
  Filled 2018-03-16: qty 1
  Filled 2018-03-16: qty 2
  Filled 2018-03-16 (×2): qty 1
  Filled 2018-03-16: qty 2
  Filled 2018-03-16: qty 1
  Filled 2018-03-16: qty 2
  Filled 2018-03-16: qty 1
  Filled 2018-03-16: qty 2
  Filled 2018-03-16 (×5): qty 1

## 2018-03-16 MED ORDER — HYDROCORTISONE NA SUCCINATE PF 100 MG IJ SOLR
50.0000 mg | INTRAMUSCULAR | Status: AC
  Administered 2018-03-16: 50 mg via INTRAVENOUS

## 2018-03-16 MED ORDER — DIPHENHYDRAMINE HCL 12.5 MG/5ML PO ELIX: 12.5000 mg | ORAL_SOLUTION | ORAL | Status: DC | PRN

## 2018-03-16 MED ORDER — METOCLOPRAMIDE HCL 5 MG PO TABS: 5.0000 mg | ORAL_TABLET | Freq: Three times a day (TID) | ORAL | Status: DC | PRN

## 2018-03-16 MED ORDER — BUPIVACAINE LIPOSOME 1.3 % IJ SUSP
10.0000 mL | Freq: Once | INTRAMUSCULAR | Status: AC
  Administered 2018-03-16: 10 mL
  Filled 2018-03-16: qty 10

## 2018-03-16 MED ORDER — FENTANYL CITRATE (PF) 100 MCG/2ML IJ SOLN
25.0000 ug | INTRAMUSCULAR | Status: DC | PRN
  Administered 2018-03-16: 50 ug via INTRAVENOUS

## 2018-03-16 MED ORDER — MORPHINE SULFATE (PF) 2 MG/ML IV SOLN
INTRAVENOUS | Status: AC
  Filled 2018-03-16: qty 1

## 2018-03-16 MED ORDER — HYDROCORTISONE NA SUCCINATE PF 100 MG IJ SOLR
INTRAMUSCULAR | Status: AC
  Filled 2018-03-16: qty 2

## 2018-03-16 MED ORDER — ACETAMINOPHEN 500 MG PO TABS: 1000.0000 mg | ORAL_TABLET | Freq: Once | ORAL | Status: DC | PRN

## 2018-03-16 MED ORDER — METHOCARBAMOL 750 MG PO TABS: 750.0000 mg | ORAL_TABLET | Freq: Four times a day (QID) | ORAL | 0 refills | Status: DC | PRN

## 2018-03-16 MED ORDER — ACETAMINOPHEN 325 MG PO TABS
325.0000 mg | ORAL_TABLET | Freq: Four times a day (QID) | ORAL | Status: DC | PRN
  Administered 2018-03-18 – 2018-03-22 (×3): 650 mg via ORAL
  Administered 2018-03-23: 325 mg via ORAL
  Filled 2018-03-16 (×3): qty 2
  Filled 2018-03-16: qty 1

## 2018-03-16 MED ORDER — PHENYLEPHRINE HCL-NACL 10-0.9 MG/250ML-% IV SOLN
25.0000 ug/min | INTRAVENOUS | Status: DC
  Administered 2018-03-16: 25 ug/min via INTRAVENOUS
  Administered 2018-03-17: 105 ug/min via INTRAVENOUS
  Administered 2018-03-17: 65 ug/min via INTRAVENOUS
  Administered 2018-03-17: 55 ug/min via INTRAVENOUS
  Administered 2018-03-17: 65 ug/min via INTRAVENOUS
  Administered 2018-03-17: 85 ug/min via INTRAVENOUS
  Administered 2018-03-17 (×2): 115 ug/min via INTRAVENOUS
  Administered 2018-03-17: 105 ug/min via INTRAVENOUS
  Administered 2018-03-18: 65 ug/min via INTRAVENOUS
  Filled 2018-03-16 (×4): qty 250
  Filled 2018-03-16: qty 500
  Filled 2018-03-16 (×2): qty 250
  Filled 2018-03-16: qty 500
  Filled 2018-03-16: qty 250
  Filled 2018-03-16 (×2): qty 500

## 2018-03-16 MED ORDER — METOPROLOL SUCCINATE ER 25 MG PO TB24: 12.5000 mg | ORAL_TABLET | Freq: Every day | ORAL | Status: DC

## 2018-03-16 MED ORDER — SODIUM CHLORIDE 0.9% FLUSH
INTRAVENOUS | Status: DC | PRN
  Administered 2018-03-16: 30 mL

## 2018-03-16 MED ORDER — HYDROCORTISONE NA SUCCINATE PF 100 MG IJ SOLR
INTRAMUSCULAR | Status: AC
  Administered 2018-03-16: 50 mg via INTRAVENOUS
  Filled 2018-03-16: qty 2

## 2018-03-16 MED ORDER — METHOCARBAMOL 500 MG PO TABS
750.0000 mg | ORAL_TABLET | Freq: Four times a day (QID) | ORAL | Status: DC | PRN
  Administered 2018-03-18 – 2018-03-23 (×9): 750 mg via ORAL
  Filled 2018-03-16 (×10): qty 2

## 2018-03-16 MED ORDER — LACTATED RINGERS IV SOLN: INTRAVENOUS | Status: DC

## 2018-03-16 MED ORDER — FENTANYL CITRATE (PF) 100 MCG/2ML IJ SOLN
INTRAMUSCULAR | Status: DC | PRN
  Administered 2018-03-16 (×2): 50 ug via INTRAVENOUS

## 2018-03-16 MED ORDER — CHLORHEXIDINE GLUCONATE 4 % EX LIQD: 60.0000 mL | Freq: Once | CUTANEOUS | Status: DC

## 2018-03-16 MED ORDER — POVIDONE-IODINE 10 % EX SWAB: 2.0000 "application " | Freq: Once | CUTANEOUS | Status: DC

## 2018-03-16 MED ORDER — DOCUSATE SODIUM 100 MG PO CAPS
100.0000 mg | ORAL_CAPSULE | Freq: Two times a day (BID) | ORAL | Status: DC
  Administered 2018-03-16 – 2018-03-21 (×11): 100 mg via ORAL
  Filled 2018-03-16 (×11): qty 1

## 2018-03-16 MED ORDER — ACETAMINOPHEN 500 MG PO TABS
1000.0000 mg | ORAL_TABLET | Freq: Once | ORAL | Status: AC
  Administered 2018-03-16: 1000 mg via ORAL
  Filled 2018-03-16: qty 2

## 2018-03-16 MED ORDER — PROPOFOL 500 MG/50ML IV EMUL
INTRAVENOUS | Status: DC | PRN
  Administered 2018-03-16: 50 ug/kg/min via INTRAVENOUS

## 2018-03-16 MED ORDER — ASPIRIN EC 81 MG PO TBEC: 81.0000 mg | DELAYED_RELEASE_TABLET | Freq: Two times a day (BID) | ORAL | 0 refills | Status: DC

## 2018-03-16 MED ORDER — INSULIN ASPART 100 UNIT/ML ~~LOC~~ SOLN
1.0000 [IU] | SUBCUTANEOUS | Status: DC
  Administered 2018-03-16 – 2018-03-17 (×2): 2 [IU] via SUBCUTANEOUS
  Administered 2018-03-17 (×2): 1 [IU] via SUBCUTANEOUS
  Administered 2018-03-18: 2 [IU] via SUBCUTANEOUS
  Administered 2018-03-18: 1 [IU] via SUBCUTANEOUS
  Administered 2018-03-18: 2 [IU] via SUBCUTANEOUS
  Administered 2018-03-18 – 2018-03-22 (×9): 1 [IU] via SUBCUTANEOUS

## 2018-03-16 MED ORDER — ASPIRIN 81 MG PO CHEW
81.0000 mg | CHEWABLE_TABLET | Freq: Two times a day (BID) | ORAL | Status: DC
  Administered 2018-03-16 – 2018-03-23 (×14): 81 mg via ORAL
  Filled 2018-03-16 (×14): qty 1

## 2018-03-16 MED ORDER — ONDANSETRON HCL 4 MG PO TABS: 4.0000 mg | ORAL_TABLET | Freq: Four times a day (QID) | ORAL | Status: DC | PRN

## 2018-03-16 MED ORDER — MIDAZOLAM HCL 2 MG/2ML IJ SOLN
INTRAMUSCULAR | Status: AC
  Filled 2018-03-16: qty 2

## 2018-03-16 MED ORDER — SORBITOL 70 % SOLN
30.0000 mL | Freq: Every day | Status: DC | PRN
  Administered 2018-03-21: 30 mL via ORAL
  Filled 2018-03-16: qty 30

## 2018-03-16 MED ORDER — HYDROCORTISONE NA SUCCINATE PF 100 MG IJ SOLR
50.0000 mg | Freq: Once | INTRAMUSCULAR | Status: AC
  Administered 2018-03-16: 50 mg via INTRAVENOUS

## 2018-03-16 MED ORDER — SODIUM CHLORIDE 0.9 % IV SOLN
250.0000 mL | INTRAVENOUS | Status: DC
  Administered 2018-03-16: 250 mL via INTRAVENOUS

## 2018-03-16 MED ORDER — GABAPENTIN 300 MG PO CAPS: 300.0000 mg | ORAL_CAPSULE | Freq: Two times a day (BID) | ORAL | 0 refills | Status: DC

## 2018-03-16 MED ORDER — ONDANSETRON HCL 4 MG PO TABS: 4.0000 mg | ORAL_TABLET | Freq: Three times a day (TID) | ORAL | 0 refills | Status: DC | PRN

## 2018-03-16 MED ORDER — DEXTROSE IN LACTATED RINGERS 5 % IV SOLN
INTRAVENOUS | Status: DC
  Administered 2018-03-16 – 2018-03-17 (×4): via INTRAVENOUS

## 2018-03-16 MED ORDER — BUPIVACAINE HCL (PF) 0.25 % IJ SOLN
INTRAMUSCULAR | Status: AC
  Filled 2018-03-16: qty 30

## 2018-03-16 MED ORDER — METHOCARBAMOL 500 MG IVPB - SIMPLE MED
500.0000 mg | Freq: Four times a day (QID) | INTRAVENOUS | Status: DC | PRN
  Filled 2018-03-16: qty 50

## 2018-03-16 MED ORDER — GABAPENTIN 300 MG PO CAPS
300.0000 mg | ORAL_CAPSULE | Freq: Three times a day (TID) | ORAL | Status: DC
  Administered 2018-03-16 – 2018-03-23 (×20): 300 mg via ORAL
  Filled 2018-03-16 (×20): qty 1

## 2018-03-16 MED ORDER — LEVOTHYROXINE SODIUM 100 MCG PO TABS
200.0000 ug | ORAL_TABLET | Freq: Every day | ORAL | Status: DC
  Administered 2018-03-17 – 2018-03-23 (×7): 200 ug via ORAL
  Filled 2018-03-16 (×7): qty 2

## 2018-03-16 MED ORDER — ONDANSETRON HCL 4 MG/2ML IJ SOLN
INTRAMUSCULAR | Status: AC
  Filled 2018-03-16: qty 4

## 2018-03-16 MED ORDER — PHENYLEPHRINE HCL-NACL 10-0.9 MG/250ML-% IV SOLN
0.0000 ug/min | INTRAVENOUS | Status: DC
  Administered 2018-03-16: 20 ug/min via INTRAVENOUS

## 2018-03-16 MED ORDER — METHOCARBAMOL 500 MG IVPB - SIMPLE MED
INTRAVENOUS | Status: AC
  Filled 2018-03-16: qty 50

## 2018-03-16 MED ORDER — OXYCODONE HCL 5 MG/5ML PO SOLN: 5.0000 mg | Freq: Once | ORAL | Status: DC | PRN

## 2018-03-16 MED ORDER — ALBUMIN HUMAN 5 % IV SOLN
INTRAVENOUS | Status: AC
  Filled 2018-03-16: qty 500

## 2018-03-16 MED ORDER — MENTHOL 3 MG MT LOZG
1.0000 | LOZENGE | OROMUCOSAL | Status: DC | PRN
  Filled 2018-03-16: qty 9

## 2018-03-16 MED ORDER — SODIUM CHLORIDE 0.9 % IV SOLN
INTRAVENOUS | Status: DC | PRN
  Administered 2018-03-16: 40 ug/min via INTRAVENOUS

## 2018-03-16 MED ORDER — ALBUMIN HUMAN 5 % IV SOLN
INTRAVENOUS | Status: AC
  Administered 2018-03-16: 12.5 g
  Filled 2018-03-16: qty 250

## 2018-03-16 MED ORDER — MORPHINE SULFATE (PF) 2 MG/ML IV SOLN
0.5000 mg | INTRAVENOUS | Status: DC | PRN
  Administered 2018-03-16 – 2018-03-17 (×2): 1 mg via INTRAVENOUS
  Filled 2018-03-16: qty 1

## 2018-03-16 MED ORDER — METOCLOPRAMIDE HCL 5 MG/ML IJ SOLN: 5.0000 mg | Freq: Three times a day (TID) | INTRAMUSCULAR | Status: DC | PRN

## 2018-03-16 MED ORDER — ONDANSETRON HCL 4 MG/2ML IJ SOLN
INTRAMUSCULAR | Status: DC | PRN
  Administered 2018-03-16: 4 mg via INTRAVENOUS

## 2018-03-16 MED ORDER — HYDROCORTISONE 10 MG PO TABS: 10.0000 mg | ORAL_TABLET | Freq: Every day | ORAL | Status: DC

## 2018-03-16 MED ORDER — TRANEXAMIC ACID-NACL 1000-0.7 MG/100ML-% IV SOLN
1000.0000 mg | INTRAVENOUS | Status: AC
  Administered 2018-03-16: 1000 mg via INTRAVENOUS
  Filled 2018-03-16: qty 100

## 2018-03-16 MED ORDER — CEFAZOLIN SODIUM-DEXTROSE 1-4 GM/50ML-% IV SOLN
1.0000 g | Freq: Four times a day (QID) | INTRAVENOUS | Status: AC
  Administered 2018-03-16 – 2018-03-17 (×2): 1 g via INTRAVENOUS
  Filled 2018-03-16 (×2): qty 50

## 2018-03-16 MED ORDER — BUPIVACAINE IN DEXTROSE 0.75-8.25 % IT SOLN
INTRATHECAL | Status: DC | PRN
  Administered 2018-03-16: 2 mL via INTRATHECAL

## 2018-03-16 MED ORDER — BUPIVACAINE HCL (PF) 0.25 % IJ SOLN
INTRAMUSCULAR | Status: DC | PRN
  Administered 2018-03-16: 30 mL

## 2018-03-16 SURGICAL SUPPLY — 37 items
BLADE SAG 18X100X1.27 (BLADE) ×1 IMPLANT
BLADE SURG SZ10 CARB STEEL (BLADE) ×6 IMPLANT
CLSR STERI-STRIP ANTIMIC 1/2X4 (GAUZE/BANDAGES/DRESSINGS) ×1 IMPLANT
COVER PERINEAL POST (MISCELLANEOUS) ×1 IMPLANT
COVER SURGICAL LIGHT HANDLE (MISCELLANEOUS) ×3 IMPLANT
DECANTER SPIKE VIAL GLASS SM (MISCELLANEOUS) ×2 IMPLANT
DRAPE IMP U-DRAPE 54X76 (DRAPES) ×3 IMPLANT
DRAPE STERI IOBAN 125X83 (DRAPES) ×1 IMPLANT
DRAPE U-SHAPE 47X51 STRL (DRAPES) ×4 IMPLANT
DRSG MEPILEX BORDER 4X8 (GAUZE/BANDAGES/DRESSINGS) ×3 IMPLANT
DURAPREP 26ML APPLICATOR (WOUND CARE) ×3 IMPLANT
GLOVE BIO SURGEON STRL SZ7.5 (GLOVE) ×6 IMPLANT
GLOVE BIOGEL PI IND STRL 8 (GLOVE) ×4 IMPLANT
GLOVE BIOGEL PI INDICATOR 8 (GLOVE) ×2
GOWN STRL REUS W/TWL LRG LVL3 (GOWN DISPOSABLE) ×1 IMPLANT
GOWN STRL REUS W/TWL XL LVL3 (GOWN DISPOSABLE) ×1 IMPLANT
HEAD FEMORAL CTAPER HIP 36-5MM (Head) ×1 IMPLANT
HEMISPHERICAL SHELL 66MM (Orthopedic Implant) ×3 IMPLANT
INSERT ACET TRID SZ H 36 0D (Insert) ×1 IMPLANT
MANIFOLD NEPTUNE II (INSTRUMENTS) ×3 IMPLANT
NS IRRIG 1000ML POUR BTL (IV SOLUTION) ×3 IMPLANT
PACK ANTERIOR HIP CUSTOM (KITS) ×1 IMPLANT
PROTECTOR NERVE ULNAR (MISCELLANEOUS) ×5 IMPLANT
SCREW GAP PLATE REST 6.5X20MM (Screw) ×2 IMPLANT
SHELL HEMISPHERICAL 66MM (Orthopedic Implant) IMPLANT
STEM FEM CMT SZ 12 18X155 REV (Stem) ×1 IMPLANT
STEM FEM CMT SZ 14 20X205 REV (Stem) ×1 IMPLANT
SUT MNCRL AB 4-0 PS2 18 (SUTURE) ×3 IMPLANT
SUT STRATAFIX 0 PDS 27 VIOLET (SUTURE) ×3
SUT VIC AB 0 CT1 36 (SUTURE) ×5 IMPLANT
SUT VIC AB 1 CT1 36 (SUTURE) ×2 IMPLANT
SUT VIC AB 2-0 CT1 27 (SUTURE) ×6
SUT VIC AB 2-0 CT1 TAPERPNT 27 (SUTURE) ×4 IMPLANT
SUTURE STRATFX 0 PDS 27 VIOLET (SUTURE) IMPLANT
TRAY FOLEY MTR SLVR 16FR STAT (SET/KITS/TRAYS/PACK) ×3 IMPLANT
WATER STERILE IRR 1000ML POUR (IV SOLUTION) ×6 IMPLANT
YANKAUER SUCT BULB TIP 10FT TU (MISCELLANEOUS) ×1 IMPLANT

## 2018-03-16 NOTE — Discharge Instructions (Signed)

## 2018-03-16 NOTE — Interval H&P Note (Signed)
I participated in the care of this patient and agree with the above history, physical and evaluation. I performed a review of the history and a physical exam as detailed   Zahi Plaskett Daniel Kaavya Puskarich MD  

## 2018-03-16 NOTE — Anesthesia Procedure Notes (Signed)
Spinal  Start time: 03/16/2018 10:15 AM End time: 03/16/2018 10:23 AM Staffing Anesthesiologist: Oleta Mouse, MD Resident/CRNA: Montel Clock, CRNA Performed: resident/CRNA  Preanesthetic Checklist Completed: patient identified, surgical consent, pre-op evaluation, timeout performed, IV checked, risks and benefits discussed and monitors and equipment checked Spinal Block Patient position: sitting Prep: DuraPrep Patient monitoring: heart rate, cardiac monitor, continuous pulse ox and blood pressure Approach: midline Location: L3-4 Needle Needle type: Pencan  Needle gauge: 24 G Needle length: 10 cm Needle insertion depth: 9 cm Assessment Sensory level: T6

## 2018-03-16 NOTE — Consult Note (Signed)
NAME:  Mark Gallagher, MRN:  [id number], DOB:  October 06, 1970, LOS: 0 ADMISSION DATE:  03/16/2018, CONSULTATION DATE:  03/16/2018 REFERRING MD:  DR Percell Miller ortho, CHIEF COMPLAINT:  Post op circulatory shock   Brief History   See below  History of present illness   Mark Gallagher 48 y.o.  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  He is on various hormonal treatments including hydrocortisone 10 mg and testosterone.  He underwent routine total hip replacement anterior approach on the right side.  According to the anesthesiologist immediate preoperatively they did give 50 mg IV hydrocortisone.  Perioperatively anesthesiologist suspects patient last over 1.5 L of blood volume.  Postoperatively patient was in circulatory shock requiring fluids and pressors.  At the time of critical care evaluation patient is on unit of blood and is received additional 100 mg hydrocortisone and 2 sets doses of 50 mg each [total 150 mg for the day].  Apparently he is paler than usual.  He is not intubated.  He is on Neo-Synephrine 50 mcg.  He is likely responding to the blood transfusion.  Critical care medicine has been called for evaluation.  Endocrinology consult was attempted but no one appears to be on call.  Past Medical History     has a past medical history of Acromegaly ([place]), Arthritis, Cancer ([place]), History of pituitary tumor, Hypertension, Hypothyroidism, Knee pain, bilateral, Pneumonia, Testosterone deficiency, and Thyroid disease.   reports that he has never smoked. He has never used smokeless tobacco.  Past Surgical History:  Procedure Laterality Date  . CARDIAC ELECTROPHYSIOLOGY STUDY AND ABLATION    . CERVICAL DISC SURGERY  2007   Anterior C4/5 vertebrectomy and c3-C6 laminoplasty with plating  . CRANIOTOMY FOR TUMOR     pituitary adenoma    Allergies  Allergen Reactions  . Oxycodone Nausea Only    Immunization History  Administered Date(s) Administered  .  Influenza,inj,Quad PF,6+ Mos 11/02/2014, 10/30/2016, 10/30/2017  . Tdap 10/30/2016    Family History  Problem Relation Age of Onset  . High blood pressure Mother   . Acute myelogenous leukemia Brother      Current Facility-Administered Medications:  .  acetaminophen (OFIRMEV) IV 1,000 mg, 1,000 mg, Intravenous, Once PRN, Oleta Mouse, MD .  acetaminophen (TYLENOL) tablet 1,000 mg, 1,000 mg, Oral, Once PRN **OR** acetaminophen (TYLENOL) solution 1,000 mg, 1,000 mg, Oral, Once PRN, Oleta Mouse, MD .  chlorhexidine (HIBICLENS) 4 % liquid 4 application, 60 mL, Topical, Once, Martensen, Charna Elizabeth III, PA-C .  chlorhexidine (HIBICLENS) 4 % liquid 4 application, 60 mL, Topical, Once, Martensen, Charna Elizabeth III, PA-C .  fentaNYL (SUBLIMAZE) 100 MCG/2ML injection, , , ,  .  fentaNYL (SUBLIMAZE) 100 MCG/2ML injection, , , ,  .  fentaNYL (SUBLIMAZE) 100 MCG/2ML injection, , , ,  .  fentaNYL (SUBLIMAZE) injection 25-50 mcg, 25-50 mcg, Intravenous, Q5 min PRN, Oleta Mouse, MD, 50 mcg at 03/16/18 1649 .  lactated ringers infusion, , Intravenous, Continuous, Martensen, Charna Elizabeth III, PA-C, Last Rate: 100 mL/hr at 03/16/18 0859 .  lactated ringers infusion, , Intravenous, Continuous, Martensen, Charna Elizabeth III, PA-C .  [START ON 03/17/2018] losartan (COZAAR) tablet 25 mg, 25 mg, Oral, Daily, Martensen, Charna Elizabeth III, PA-C .  methocarbamol (ROBAXIN) tablet 750 mg, 750 mg, Oral, Q6H PRN **OR** methocarbamol (ROBAXIN) 500 mg in dextrose 5 % 50 mL IVPB, 500 mg, Intravenous, Q6H PRN, Martensen, Charna Elizabeth III, PA-C .  methocarbamol (ROBAXIN) 500 MG/50 ML IVPB, , , ,  .  oxyCODONE (Oxy IR/ROXICODONE) immediate release tablet 5 mg, 5 mg, Oral, Once PRN **OR** oxyCODONE (ROXICODONE) 5 MG/5ML solution 5 mg, 5 mg, Oral, Once PRN, Oleta Mouse, MD .  phenylephrine (NEOSYNEPHRINE) 10-0.9 MG/250ML-% infusion, 0-400 mcg/min, Intravenous, Titrated, Oleta Mouse, MD, Last Rate:  67.5 mL/hr at 03/16/18 1811, 45 mcg/min at 03/16/18 1811 .  povidone-iodine 10 % swab 2 application, 2 application, Topical, Once, Prudencio Burly III, [place] [facility] Events   03/16/2018 - admit for elective hip replacement  Consults:  2/25 - ccm consult for hypotension  Procedures:  2/25 - hip replacement  Significant Diagnostic Tests:  x  Micro Data:  x  Antimicrobials:  x   Interim history/subjective:  2/25 - ccm consult  Objective   Blood pressure 95/63, pulse (!) 42, temperature 97.9 F (36.6 C), temperature source Oral, resp. rate 18, SpO2 99 %.        Intake/Output Summary (Last 24 hours) at 03/16/2018 1820 Last data filed at 03/16/2018 1553 Gross per 24 hour  Intake 4000 ml  Output 1900 ml  Net 2100 ml   There were no vitals filed for this visit.  Examination: General: Obvious acromegaly, looks pale, no distress HENT: No neck nodes.  No elevated JVP Lungs: Clear to auscultation bilaterally.  No distress no wheeze no crackles Cardiovascular: Heart rate 84 [previous Wolff-Parkinson-White syndrome status post ablation] Abdomen: Soft no organomegaly.  Nontender Extremities: No cyanosis no clubbing no edema Neuro: Alert and oriented x3.  Soft voice GU: Not examined   LABS    PULMONARY No results for input(s): PHART, PCO2ART, PO2ART, HCO3, TCO2, O2SAT in the last 168 hours.  Invalid input(s): PCO2, PO2  CBC Recent Labs  Lab 03/16/18 1550  HGB 10.1*  HCT 32.1*  WBC 4.7  PLT 127*    COAGULATION No results for input(s): INR in the last 168 hours.  CARDIAC  No results for input(s): TROPONINI in the last 168 hours. No results for input(s): PROBNP in the last 168 hours.   CHEMISTRY No results for input(s): NA, K, CL, CO2, GLUCOSE, BUN, CREATININE, CALCIUM, MG, PHOS in the last 168 hours. Estimated Creatinine Clearance: 149.7 mL/min (by C-G formula based on SCr of 0.66 mg/dL).   LIVER No results for input(s): AST, ALT,  ALKPHOS, BILITOT, PROT, ALBUMIN, INR in the last 168 hours.   INFECTIOUS No results for input(s): LATICACIDVEN, PROCALCITON in the last 168 hours.   ENDOCRINE CBG (last 3)  No results for input(s): GLUCAP in the last 72 hours.       IMAGING x48h  - image(s) personally visualized  -   highlighted in bold Dg C-arm 1-60 Min-no Report  Result Date: 03/16/2018 CLINICAL DATA:  48 year old male for right hip replacement. Initial encounter. EXAM: OPERATIVE right HIP (WITH PELVIS IF PERFORMED) 2 VIEWS TECHNIQUE: Fluoroscopic spot image(s) were submitted for interpretation post-operatively. Fluoroscopic time: 28 seconds. COMPARISON:  12/15/2016. FINDINGS: Two intraoperative C-arm views submitted for review after surgery. This reveals right hip prosthesis in place which appears in satisfactory position without complication noted on frontal imaging. This can be assessed on follow-up. IMPRESSION: Post total right hip replacement. Electronically Signed   By: Genia Del M.D.   On: 03/16/2018 14:06   Dg Hip Operative Unilat W Or W/o Pelvis Right  Result Date: 03/16/2018 CLINICAL DATA:  48 year old male for right hip replacement. Initial encounter. EXAM: OPERATIVE right HIP (WITH PELVIS IF PERFORMED) 2 VIEWS TECHNIQUE: Fluoroscopic spot image(s) were submitted for interpretation post-operatively. Fluoroscopic time: 44  seconds. COMPARISON:  12/15/2016. FINDINGS: Two intraoperative C-arm views submitted for review after surgery. This reveals right hip prosthesis in place which appears in satisfactory position without complication noted on frontal imaging. This can be assessed on follow-up. IMPRESSION: Post total right hip replacement. Electronically Signed   By: Genia Del M.D.   On: 03/16/2018 14:06     Resolved Hospital Problem list   x  Assessment & Plan:  Postoperative circulatory shock due to blood loss and possible stress to the adrenals  -PRBC to keep hemoglobin greater than 7 g% or volume  replacement for hemodynamics -Rule out MI -Agree with stress dose steroids -Continue Neo-Synephrine for mean arterial pressure goal greater than 65 and volume resuscitation -Peripheral line -Might need inpatient endocrinology consult -Check labs  Best practice:  Diet: He wants to eat.  Will allow him to have meds and sips but otherwise n.p.o. Pain/Anxiety/Delirium protocol (if indicated): Monitor VAP protocol (if indicated): Head of bed greater than 30 degrees DVT prophylaxis: SCD GI prophylaxis: Protonix Glucose control: Monitor SSI Mobility: Bedrest Code Status: Full Family Communication: Updated patient in the PACU Disposition: From PACU to ICU      ATTESTATION & SIGNATURE   The patient Mark Gallagher is critically ill with multiple organ systems failure and requires high complexity decision making for assessment and support, frequent evaluation and titration of therapies, application of advanced monitoring technologies and extensive interpretation of multiple databases.   Critical Care Time devoted to patient care services described in this note is 30 minutes. This time reflects time of care of this signee Dr Brand Males. This critical care time does not reflect procedure time, or teaching time or supervisory time of PA/NP/Med student/Med Resident etc but could involve care discussion time     Dr. Brand Males, M.D., [facility].C.P Pulmonary and Critical Care Medicine Staff Physician [place] [facility] Pager: [phone], If no answer or between  15:00h - 7:00h: call [phone]  03/16/2018 6:20 PM

## 2018-03-16 NOTE — Transfer of Care (Signed)
Immediate Anesthesia Transfer of Care Note  Patient: Mark Gallagher  Procedure(s) Performed: TOTAL HIP ARTHROPLASTY Anterior (Right Hip)  Patient Location: PACU  Anesthesia Type:Spinal  Level of Consciousness: drowsy and patient cooperative  Airway & Oxygen Therapy: Patient Spontanous Breathing and Patient connected to face mask oxygen  Post-op Assessment: Report given to RN and Post -op Vital signs reviewed and stable  Post vital signs: Reviewed and stable  Last Vitals:  Vitals Value Taken Time  BP 95/64 03/16/2018  2:25 PM  Temp    Pulse 85 03/16/2018  2:27 PM  Resp 19 03/16/2018  2:27 PM  SpO2 99 % 03/16/2018  2:27 PM  Vitals shown include unvalidated device data.  Last Pain:  Vitals:   03/16/18 0812  PainSc: 0-No pain         Complications: No apparent anesthesia complications

## 2018-03-16 NOTE — Progress Notes (Signed)
PT Cancellation Note  Patient Details Name: JAHEIM CANINO MRN: [id number] DOB: [birth year]   Cancelled Treatment:     Noted events following surgery. Will hold PT eval and assessment.    Clide Dales 03/16/2018, 6:54 PM  Clide Dales, PT Acute Rehabilitation Services Pager: [phone] Office: [phone] 03/16/2018

## 2018-03-16 NOTE — Op Note (Signed)
03/16/2018  10:30 AM  PATIENT:  Mark Gallagher   MRN: [id number]  PRE-OPERATIVE DIAGNOSIS:  OA RIGHT HIP  POST-OPERATIVE DIAGNOSIS:  * No post-op diagnosis entered *  PROCEDURE:  Procedure(s): TOTAL HIP ARTHROPLASTY Anterior  PREOPERATIVE INDICATIONS:    Mark Gallagher is an 48 y.o. male who has a diagnosis of <principal problem not specified> and elected for surgical management after failing conservative treatment.  The risks benefits and alternatives were discussed with the patient including but not limited to the risks of nonoperative treatment, versus surgical intervention including infection, bleeding, nerve injury, periprosthetic fracture, the need for revision surgery, dislocation, leg length discrepancy, blood clots, cardiopulmonary complications, morbidity, mortality, among others, and they were willing to proceed.     OPERATIVE REPORT     SURGEON:   Renette Butters, MD    ASSISTANT:  Roxan Hockey, PA-C, he was present and scrubbed throughout the case, critical for completion in a timely fashion, and for retraction, instrumentation, and closure.     ANESTHESIA:  General    COMPLICATIONS:  None.     COMPONENTS:  [employer] restoration HA 12 x 20 with a 36 mm -5 head ball and a PSL acetabular shell size 66 with a  polyethylene liner    PROCEDURE IN DETAIL:   The patient was met in the holding area and  identified.  The appropriate hip was identified and marked at the operative site.  The patient was then transported to the OR  and  placed under anesthesia per that record.  At that point, the patient was  placed in the supine position and  secured to the operating room table and all bony prominences padded. He received pre-operative antibiotics    The operative lower extremity was prepped from the iliac crest to the distal leg.  Sterile draping was performed.  Time out was performed prior to incision.      Skin incision was made just 2 cm lateral to the ASIS  extending  in line with the tensor fascia lata. Electrocautery was used to control all bleeders. I dissected down sharply to the fascia of the tensor fascia lata was confirmed that the muscle fibers beneath were running posteriorly. I then incised the fascia over the superficial tensor fascia lata in line with the incision. The fascia was elevated off the anterior aspect of the muscle the muscle was retracted posteriorly and protected throughout the case. I then used electrocautery to incise the tensor fascia lata fascia control and all bleeders. Immediately visible was the fat over top of the anterior neck and capsule.  I removed the anterior fat from the capsule and elevated the rectus muscle off of the anterior capsule. I then removed a large time of capsule. The retractors were then placed over the anterior acetabulum as well as around the superior and inferior neck.  I then removed a section of the femoral neck and a napkin ring fashion. Then used the power course to remove the femoral head from the acetabulum and thoroughly irrigated the acetabulum. I sized the femoral head.    I then exposed the deep acetabulum, cleared out any tissue including the ligamentum teres.   After adequate visualization, I excised the labrum, and then sequentially reamed.  I then impacted the acetabular implant into place using fluoroscopy for guidance.  Appropriate version and inclination was confirmed clinically matching their bony anatomy, and with fluoroscopy.  I placed a 20 mm screw in the posterior/superio position with an excellent bite.  I then placed the polyethylene liner in place  I then adducted the leg and released the external rotators from the posterior femur allowing it to be easily delivered up lateral and anterior to the acetabulum for preparation of the femoral canal.    I then prepared the proximal femur using the cookie-cutter and then sequentially reamed and broached.  A trial broach, neck, and head was  utilized, and I reduced the hip and used floroscopy to assess the neck length and femoral implant.  I then impacted the femoral prosthesis into place into the appropriate version. The hip was then reduced and fluoroscopy confirmed appropriate position. Leg lengths were restored.  I then irrigated the hip copiously again with, and repaired the fascia with Vicryl, followed by monocryl for the subcutaneous tissue, Monocryl for the skin, Steri-Strips and sterile gauze. The patient was then awakened and returned to PACU in stable and satisfactory condition. There were no complications.  POST OPERATIVE PLAN: WBAT, DVT px: SCD's/TED, ambulation and chemical dvt px  Haydn Hutsell, MD Orthopedic Surgeon [phone]     

## 2018-03-17 ENCOUNTER — Inpatient Hospital Stay (HOSPITAL_COMMUNITY): Payer: BLUE CROSS/BLUE SHIELD

## 2018-03-17 ENCOUNTER — Encounter (HOSPITAL_COMMUNITY): Payer: Self-pay

## 2018-03-17 ENCOUNTER — Other Ambulatory Visit: Payer: Self-pay

## 2018-03-17 DIAGNOSIS — R579 Shock, unspecified: Secondary | ICD-10-CM

## 2018-03-17 LAB — CBC WITH DIFFERENTIAL/PLATELET
Abs Immature Granulocytes: 0 10*3/uL (ref 0.00–0.07)
Abs Immature Granulocytes: 0.05 10*3/uL (ref 0.00–0.07)
Abs Immature Granulocytes: 0.02 10*3/uL (ref 0.00–0.07)
Band Neutrophils: 8 %
Basophils Absolute: 0 10*3/uL (ref 0.0–0.1)
Basophils Absolute: 0.1 10*3/uL (ref 0.0–0.1)
Basophils Absolute: 0.1 10*3/uL (ref 0.0–0.1)
Basophils Relative: 0 %
Basophils Relative: 1 %
Basophils Relative: 1 %
Eosinophils Absolute: 0 10*3/uL (ref 0.0–0.5)
Eosinophils Absolute: 0 10*3/uL (ref 0.0–0.5)
Eosinophils Absolute: 0 10*3/uL (ref 0.0–0.5)
Eosinophils Relative: 0 %
Eosinophils Relative: 0 %
Eosinophils Relative: 0 %
HCT: 30.4 % — ABNORMAL LOW (ref 39.0–52.0)
HCT: 28.8 % — ABNORMAL LOW (ref 39.0–52.0)
HCT: 29.2 % — ABNORMAL LOW (ref 39.0–52.0)
Hemoglobin: 9.9 g/dL — ABNORMAL LOW (ref 13.0–17.0)
Hemoglobin: 9.7 g/dL — ABNORMAL LOW (ref 13.0–17.0)
Hemoglobin: 9.3 g/dL — ABNORMAL LOW (ref 13.0–17.0)
Immature Granulocytes: 1 %
Immature Granulocytes: 0 %
Lymphocytes Relative: 13 %
Lymphocytes Relative: 17 %
Lymphocytes Relative: 11 %
Lymphs Abs: 1.3 10*3/uL (ref 0.7–4.0)
Lymphs Abs: 1.5 10*3/uL (ref 0.7–4.0)
Lymphs Abs: 0.8 10*3/uL (ref 0.7–4.0)
MCH: 31.4 pg (ref 26.0–34.0)
MCH: 32.2 pg (ref 26.0–34.0)
MCH: 31.3 pg (ref 26.0–34.0)
MCHC: 32.6 g/dL (ref 30.0–36.0)
MCHC: 33.7 g/dL (ref 30.0–36.0)
MCHC: 31.8 g/dL (ref 30.0–36.0)
MCV: 96.5 fL (ref 80.0–100.0)
MCV: 95.7 fL (ref 80.0–100.0)
MCV: 98.3 fL (ref 80.0–100.0)
Monocytes Absolute: 0.5 10*3/uL (ref 0.1–1.0)
Monocytes Absolute: 0.8 10*3/uL (ref 0.1–1.0)
Monocytes Absolute: 0.8 10*3/uL (ref 0.1–1.0)
Monocytes Relative: 5 %
Monocytes Relative: 9 %
Monocytes Relative: 11 %
Neutro Abs: 8 10*3/uL — ABNORMAL HIGH (ref 1.7–7.7)
Neutro Abs: 6.8 10*3/uL (ref 1.7–7.7)
Neutro Abs: 5.5 10*3/uL (ref 1.7–7.7)
Neutrophils Relative %: 74 %
Neutrophils Relative %: 72 %
Neutrophils Relative %: 77 %
Platelets: 127 10*3/uL — ABNORMAL LOW (ref 150–400)
Platelets: 130 10*3/uL — ABNORMAL LOW (ref 150–400)
Platelets: 127 10*3/uL — ABNORMAL LOW (ref 150–400)
RBC: 3.15 MIL/uL — ABNORMAL LOW (ref 4.22–5.81)
RBC: 3.01 MIL/uL — ABNORMAL LOW (ref 4.22–5.81)
RBC: 2.97 MIL/uL — ABNORMAL LOW (ref 4.22–5.81)
RDW: 14 % (ref 11.5–15.5)
RDW: 13.8 % (ref 11.5–15.5)
RDW: 13.8 % (ref 11.5–15.5)
WBC: 9.7 10*3/uL (ref 4.0–10.5)
WBC: 9.3 10*3/uL (ref 4.0–10.5)
WBC: 7.2 10*3/uL (ref 4.0–10.5)
nRBC: 0 % (ref 0.0–0.2)
nRBC: 0 % (ref 0.0–0.2)
nRBC: 0 % (ref 0.0–0.2)

## 2018-03-17 LAB — HEPATIC FUNCTION PANEL
ALT: 16 U/L (ref 0–44)
AST: 17 U/L (ref 15–41)
Albumin: 3.2 g/dL — ABNORMAL LOW (ref 3.5–5.0)
Alkaline Phosphatase: 35 U/L — ABNORMAL LOW (ref 38–126)
Bilirubin, Direct: 0.3 mg/dL — ABNORMAL HIGH (ref 0.0–0.2)
Indirect Bilirubin: 1.6 mg/dL — ABNORMAL HIGH (ref 0.3–0.9)
Total Bilirubin: 1.9 mg/dL — ABNORMAL HIGH (ref 0.3–1.2)
Total Protein: 5 g/dL — ABNORMAL LOW (ref 6.5–8.1)

## 2018-03-17 LAB — BASIC METABOLIC PANEL
Anion gap: 7 (ref 5–15)
BUN: 24 mg/dL — ABNORMAL HIGH (ref 6–20)
CO2: 23 mmol/L (ref 22–32)
Calcium: 8.2 mg/dL — ABNORMAL LOW (ref 8.9–10.3)
Chloride: 107 mmol/L (ref 98–111)
Creatinine, Ser: 0.72 mg/dL (ref 0.61–1.24)
GFR calc Af Amer: >60 mL/min (ref >60)
GFR calc non Af Amer: >60 mL/min (ref >60)
Glucose, Bld: 169 mg/dL — ABNORMAL HIGH (ref 70–99)
Potassium: 3.6 mmol/L (ref 3.5–5.1)
Sodium: 137 mmol/L (ref 135–145)

## 2018-03-17 LAB — TROPONIN I: Troponin I: <0.03 ng/mL (ref <0.03)

## 2018-03-17 LAB — GLUCOSE, CAPILLARY
Glucose-Capillary: 115 mg/dL — ABNORMAL HIGH (ref 70–99)
Glucose-Capillary: 129 mg/dL — ABNORMAL HIGH (ref 70–99)
Glucose-Capillary: 137 mg/dL — ABNORMAL HIGH (ref 70–99)
Glucose-Capillary: 155 mg/dL — ABNORMAL HIGH (ref 70–99)
Glucose-Capillary: 159 mg/dL — ABNORMAL HIGH (ref 70–99)
Glucose-Capillary: 95 mg/dL (ref 70–99)

## 2018-03-17 LAB — ABO/RH: ABO/RH(D): B POS

## 2018-03-17 LAB — LACTIC ACID, PLASMA: Lactic Acid, Venous: 1.5 mmol/L (ref 0.5–1.9)

## 2018-03-17 LAB — MAGNESIUM: Magnesium: 1.6 mg/dL — ABNORMAL LOW (ref 1.7–2.4)

## 2018-03-17 LAB — PROTIME-INR
INR: 1.3 — ABNORMAL HIGH (ref 0.8–1.2)
Prothrombin Time: 16.3 seconds — ABNORMAL HIGH (ref 11.4–15.2)

## 2018-03-17 LAB — PHOSPHORUS: Phosphorus: 2.9 mg/dL (ref 2.5–4.6)

## 2018-03-17 MED ORDER — HYDROCORTISONE NA SUCCINATE PF 100 MG IJ SOLR
50.0000 mg | Freq: Four times a day (QID) | INTRAMUSCULAR | Status: DC
  Administered 2018-03-17 – 2018-03-20 (×12): 50 mg via INTRAVENOUS
  Filled 2018-03-17 (×12): qty 2

## 2018-03-17 MED ORDER — MAGNESIUM SULFATE 4 GM/100ML IV SOLN
4.0000 g | Freq: Once | INTRAVENOUS | Status: AC
  Administered 2018-03-17: 4 g via INTRAVENOUS
  Filled 2018-03-17: qty 100

## 2018-03-17 MED ORDER — MUSCLE RUB 10-15 % EX CREA
TOPICAL_CREAM | CUTANEOUS | Status: DC | PRN
  Administered 2018-03-17 – 2018-03-21 (×2): via TOPICAL
  Administered 2018-03-22: 1 via TOPICAL
  Administered 2018-03-23: 09:00:00 via TOPICAL
  Filled 2018-03-17 (×3): qty 85

## 2018-03-17 MED ORDER — ORAL CARE MOUTH RINSE
15.0000 mL | Freq: Two times a day (BID) | OROMUCOSAL | Status: DC
  Administered 2018-03-17 – 2018-03-23 (×10): 15 mL via OROMUCOSAL

## 2018-03-17 MED ORDER — LACTATED RINGERS IV BOLUS
500.0000 mL | Freq: Once | INTRAVENOUS | Status: AC
  Administered 2018-03-17: 500 mL via INTRAVENOUS

## 2018-03-17 MED ORDER — MAGNESIUM SULFATE 2 GM/50ML IV SOLN: 2.0000 g | Freq: Once | INTRAVENOUS | Status: DC

## 2018-03-17 NOTE — Progress Notes (Signed)
Hospital Course: 03/16/18: Mr. Sitter is a 48 year old male with acromegaly/complex endocrine history followed by Dr. Jerl Mina at [facility].  The patient underwent right total hip arthroplasty yesterday morning.  He received preoperative stress dose steroids.  He was stable throughout the procedure and had about 1100 mL blood loss.  Postoperatively he had significant hypotension requiring Neo-Synephrine thought to be a combination of blood loss and his complex endocrine condition.  He was given additional steroid and CCM was consulted and the patient was transferred to the ICU.  He was given albumin and received 2 units PRBC.  His hemoglobin has remained stable without sign of active blood loss.  Subjective: Patient reports pain as moderate to severe in both hips and knees.  Tolerating liquids.   No CP, SOB.  Not yet OOB.  Objective:   VITALS:   Vitals:   03/17/18 0615 03/17/18 0630 03/17/18 0700 03/17/18 0800  BP: (!) 89/50 (!) 75/50 102/61 (!) 94/58  Pulse: 89 91 94 93  Resp: 18 18 18 19   Temp:      TempSrc:      SpO2: 94% 96% 95% 96%  Weight:      Height:       CBC Latest Ref Rng & Units 03/17/2018 03/17/2018 03/16/2018  WBC 4.0 - 10.5 K/uL 9.3 9.7 8.6  Hemoglobin 13.0 - 17.0 g/dL 9.7(L) 9.9(L) 10.6(L)  Hematocrit 39.0 - 52.0 % 28.8(L) 30.4(L) 31.8(L)  Platelets 150 - 400 K/uL 130(L) 127(L) 128(L)   BMP Latest Ref Rng & Units 03/17/2018 03/16/2018 03/09/2018  Glucose 70 - 99 mg/dL 169(H) 140(H) 128(H)  BUN 6 - 20 mg/dL 24(H) 21(H) 21(H)  Creatinine 0.61 - 1.24 mg/dL 0.72 0.56(L) 0.66  Sodium 135 - 145 mmol/L 137 139 138  Potassium 3.5 - 5.1 mmol/L 3.6 3.5 3.7  Chloride 98 - 111 mmol/L 107 107 104  CO2 22 - 32 mmol/L 23 23 25   Calcium 8.9 - 10.3 mg/dL 8.2(L) 8.4(L) 9.2   Intake/Output      02/25 0701 - 02/26 0700 02/26 0701 - 02/27 0700   P.O. 960    I.V. (mL/kg) 6902.4 (64.4)    Blood 630    IV Piggyback 100    Total Intake(mL/kg) 8592.4 (80.2)    Urine (mL/kg/hr) 1400      Blood 1100    Total Output 2500    Net +6092.4            Physical Exam: General: NAD.  Upright in bed.  Calm, conversant.  Girlfriend at bedside. Resp: No increased wob Cardio: regular rate and rhythm ABD soft Neurologically intact MSK RLE: Neurovascularly intact Sensation intact distally Feet warm and well-perfused. Dorsiflexion/Plantar/EHL/FHL intact.  Chronic Left sided foot drop. Incision: dressing C/D/I   Assessment / Plan: 1 Day Post-Op  S/P Procedure(s) (LRB): TOTAL HIP ARTHROPLASTY Anterior (Right) by Dr. Ernesta Amble. Percell Miller on 03/16/2018  Principal Problem:   Primary osteoarthritis of right hip Active Problems:   Acromegaly ([place])   Hypothyroidism   History of fusion of cervical spine   Iron deficiency anemia   Acquired left foot drop   AVNRT (AV nodal re-entry tachycardia) (HCC)   Primary localized osteoarthritis of hip   S/P total hip arthroplasty  Primary osteoarthritis, status post total hip arthroplasty  Pain Mgmt: We will add tramadol.  Continue Gabapentin / Norco.  Avoid IV medicines when possible due to effect on blood pressure.  Consider oxycodone: Has had previous N/V w/o food.  Mobilize and advance diet  when cleared by CCM  Weight Bearing: Weight Bearing as Tolerated (WBAT) RLE Dressings: Maintain Mepilex.   VTE prophylaxis: Aspirin, SCDs, ambulation  Postoperative Circulatory Shock  ? Secondary to blood loss and adrenal stress  Evaluation / Treatment ongoing by CCM.  Considering Endocrine consultation.  Art line placement failed overnight.  ABLA: S/p 2 units PRBC.  No sign of active bleeding.  Hgb Appears stable considering hemodilution with management of hypotension.  Dispo: Remain inpatient   Prudencio Burly III, PA-C 03/17/2018, 8:18 AM

## 2018-03-17 NOTE — Progress Notes (Signed)
Physical Therapy Treatment Patient Details Name: Mark Gallagher MRN: [id number] DOB: 1971-01-18 Today's Date: 03/17/2018    History of Present Illness R DA-THA with postop hypotension; PMH of acromegaly, spinal fusion, L foot drop (wears AFO)    PT Comments    BP 75/48, deferred ambulation. Performed R THA exercises in supine. Pt very motivated and puts forth good effort. Fatigued this afternoon.   Follow Up Recommendations  Follow surgeon's recommendation for DC plan and follow-up therapies;Home health PT;Supervision for mobility/OOB     Equipment Recommendations  None recommended by PT    Recommendations for Other Services       Precautions / Restrictions Precautions Precautions: Fall Precaution Comments: monitor BP, L AFO at baseline Restrictions Weight Bearing Restrictions: No Other Position/Activity Restrictions: WBAT    Mobility  Bed Mobility Overal bed mobility: Needs Assistance Bed Mobility: Supine to Sit     Supine to sit: Min guard     General bed mobility comments: min/guard for RLE, HOB up 20*, used rail; pt sat edge of bed x 10 minutes 2* lightheadedness initially upon sitting but then resolved after a couple minutes (see flowsheets for orthostatics)  Transfers Overall transfer level: Needs assistance Equipment used: Rolling walker (2 wheeled) Transfers: Sit to/from Stand Sit to Stand: +2 safety/equipment;From elevated surface;Min assist         General transfer comment: VCs hand placement, min A to rise/steady    Stairs             Wheelchair Mobility    Modified Rankin (Stroke Patients Only)       Balance Overall balance assessment: Needs assistance Sitting-balance support: Feet supported Sitting balance-Leahy Scale: Fair     Standing balance support: Bilateral upper extremity supported Standing balance-Leahy Scale: Poor Standing balance comment: relies on BUE support                            Cognition  Arousal/Alertness: Lethargic Behavior During Therapy: WFL for tasks assessed/performed Overall Cognitive Status: Within Functional Limits for tasks assessed                                        Exercises Total Joint Exercises Ankle Circles/Pumps: AROM;Both;10 reps;Supine Quad Sets: AROM;Right;Supine;10 reps Short Arc Quad: AROM;Right;10 reps;Supine Heel Slides: AAROM;10 reps;Supine;Both Hip ABduction/ADduction: AAROM;Right;10 reps;Supine    General Comments        Pertinent Vitals/Pain Pain Assessment: 0-10 Pain Score: 3  Pain Location: B knees (chronic); R hip Pain Descriptors / Indicators: Sore;Aching Pain Intervention(s): Limited activity within patient's tolerance;Monitored during session;Ice applied    Home Living                      Prior Function        Comments: help with R shoe and sock; walks with quad cane and L AFO. Pt drives   PT Goals (current goals can now be found in the care plan section) Acute Rehab PT Goals Patient Stated Goal: go to church, visit friends PT Goal Formulation: With patient/family Time For Goal Achievement: 03/24/18 Potential to Achieve Goals: Good Progress towards PT goals: Progressing toward goals    Frequency    7X/week      PT Plan Current plan remains appropriate    Co-evaluation PT/OT/SLP Co-Evaluation/Treatment: Yes Reason for Co-Treatment: For patient/therapist safety PT goals addressed during  session: Mobility/safety with mobility OT goals addressed during session: ADL's and self-care      AM-PAC PT "6 Clicks" Mobility   Outcome Measure  Help needed turning from your back to your side while in a flat bed without using bedrails?: A Little Help needed moving from lying on your back to sitting on the side of a flat bed without using bedrails?: A Little Help needed moving to and from a bed to a chair (including a wheelchair)?: A Little Help needed standing up from a chair using your arms  (e.g., wheelchair or bedside chair)?: A Little Help needed to walk in hospital room?: A Little Help needed climbing 3-5 steps with a railing? : A Lot 6 Click Score: 17    End of Session Equipment Utilized During Treatment: Gait belt Activity Tolerance: Patient limited by fatigue Patient left: with call bell/phone within reach;with nursing/sitter in room;in bed Nurse Communication: Mobility status PT Visit Diagnosis: Muscle weakness (generalized) (M62.81);Difficulty in walking, not elsewhere classified (R26.2);Pain Pain - Right/Left: Right Pain - part of body: Hip;Knee     Time: 1440-1456 PT Time Calculation (min) (ACUTE ONLY): 16 min  Charges:  $Therapeutic Exercise: 8-22 mins                     [name] PT 03/17/2018  Acute Rehabilitation Services Pager [phone] Office [phone]

## 2018-03-17 NOTE — Progress Notes (Signed)
Pt's BP remains labile with neo drip running.  Dr. Ermalene Postin at bedside on and off throughout PACU stay aware of BP.  Neosynephrine had been started with drip rate changing based on BP.  Dr. Ermalene Postin ordered PRBC's  due to BP not rising even with neo drip rate increasing.  H.CBerton Mount P.A. made aware of BP instability and ICU orders received.  Dr. Chase Caller critical care came to evaluate pt at bedside in PACU.

## 2018-03-17 NOTE — Evaluation (Signed)
Occupational Therapy Evaluation Patient Details Name: Mark Gallagher MRN: [id number] DOB: 1970-07-14 Today's Date: 03/17/2018    History of Present Illness R DA-THA with postop hypotension; PMH of acromegaly, spinal fusion, L foot drop (wears AFO). H/o pituitary tumor    Clinical Impression   This 48 year old man was admitted for the above sx.  At baseline, he is mostly mod I and drives. He needs assist for R shoe/sock.  Pt will benefit from continued OT to increase safety and independence with adls. Goals are for min guard to supervision level.    Follow Up Recommendations  Supervision/Assistance - 24 hour    Equipment Recommendations  (to be further assessed: high commode:  pt is 6'7")    Recommendations for Other Services       Precautions / Restrictions Precautions Precautions: Fall Precaution Comments: monitor BP, L AFO at baseline Restrictions Weight Bearing Restrictions: No Other Position/Activity Restrictions: WBAT      Mobility Bed Mobility Overal bed mobility: Needs Assistance Bed Mobility: Supine to Sit     Supine to sit: Min guard     General bed mobility comments: min/guard for RLE, HOB up 20*, used rail; pt sat edge of bed x 10 minutes 2* lightheadedness initially upon sitting but then resolved after a couple minutes (see flowsheets for orthostatics)  Transfers Overall transfer level: Needs assistance Equipment used: Rolling walker (2 wheeled) Transfers: Sit to/from Stand Sit to Stand: +2 safety/equipment;From elevated surface;Min assist         General transfer comment: VCs hand placement, min A to rise/steady    Balance Overall balance assessment: Needs assistance Sitting-balance support: Feet supported Sitting balance-Leahy Scale: Fair     Standing balance support: Bilateral upper extremity supported Standing balance-Leahy Scale: Poor Standing balance comment: relies on BUE support                           ADL either  performed or assessed with clinical judgement   ADL Overall ADL's : Needs assistance/impaired                         Toilet Transfer: Minimal assistance;+2 for safety/equipment;Ambulation;RW(built up chair)             General ADL Comments: Pt was a little dizzy sitting up:  BP WFLs. He states he has a h/o vertigo, but this felt more lightheaded.  ADLs based on clinical judgment:  set up for UB (assist only to manage lines), mod to max A for LB. Pt is able to release a hand in standing     Vision   Additional Comments: dysconjugate eyes     Perception     Praxis      Pertinent Vitals/Pain Pain Assessment: 0-10 Pain Score: 3  Pain Location: B knees (chronic) Pain Descriptors / Indicators: Sore;Aching Pain Intervention(s): Limited activity within patient's tolerance;Monitored during session;Premedicated before session;Ice applied(muscle cream applied prior to PT session)     Hand Dominance     Extremity/Trunk Assessment Upper Extremity Assessment Upper Extremity Assessment: Overall WFL for tasks assessed   Lower Extremity Assessment Lower Extremity Assessment: LLE deficits/detail;RLE deficits/detail RLE Deficits / Details: hip flexion AROM ~30* RLE Sensation: WNL LLE Deficits / Details: h/o L foot drop   Cervical / Trunk Assessment Cervical / Trunk Assessment: Normal   Communication Communication Communication: No difficulties   Cognition Arousal/Alertness: Awake/alert Behavior During Therapy: WFL for tasks assessed/performed Overall Cognitive  Status: Within Functional Limits for tasks assessed                                     General Comments   HR up to 126.  See vitals section for BPs (WFLs).  Parents present during session and can provide 24/7     Exercises    Shoulder Instructions      Home Living Family/patient expects to be discharged to:: Private residence   Available Help at Discharge: Family   Home Access: Stairs  to enter Entrance Stairs-Number of Steps: 1 + 6 + 1 and flight to bedroom         Bathroom Shower/Tub: Teacher, early years/pre: Handicapped height     Home Equipment: Tub bench;Grab bars - toilet;Walker - 2 wheels;Cane - quad;Other (comment)(L AFO)   Additional Comments: parents live nearby: are with him 80% of time; can be there 24/7 initially      Prior Functioning/Environment Level of Independence: Independent with assistive device(s)        Comments: help with R shoe and sock; walks with quad cane and L AFO. Pt drives        OT Problem List: Decreased strength;Impaired balance (sitting and/or standing);Decreased knowledge of use of DME or AE;Cardiopulmonary status limiting activity;Pain      OT Treatment/Interventions: Self-care/ADL training;DME and/or AE instruction;Patient/family education;Balance training;Therapeutic activities    OT Goals(Current goals can be found in the care plan section) Acute Rehab OT Goals Patient Stated Goal: go to church, visit friends OT Goal Formulation: With patient Time For Goal Achievement: 03/31/18 Potential to Achieve Goals: Good ADL Goals Pt Will Perform Grooming: with supervision;standing Pt Will Transfer to Toilet: with supervision;ambulating;bedside commode(vs comfort height with grab bar) Pt Will Perform Toileting - Clothing Manipulation and hygiene: with supervision;sit to/from stand Pt Will Perform Tub/Shower Transfer: Tub transfer;with transfer board;ambulating;with min assist Additional ADL Goal #1: pt will demonstrated vs verbalize use of AE for adls (with set up)  OT Frequency: Min 2X/week   Barriers to D/C:            Co-evaluation PT/OT/SLP Co-Evaluation/Treatment: Yes Reason for Co-Treatment: For patient/therapist safety PT goals addressed during session: Mobility/safety with mobility OT goals addressed during session: ADL's and self-care      AM-PAC OT "6 Clicks" Daily Activity     Outcome  Measure Help from another person eating meals?: None Help from another person taking care of personal grooming?: A Little Help from another person toileting, which includes using toliet, bedpan, or urinal?: A Little Help from another person bathing (including washing, rinsing, drying)?: A Lot Help from another person to put on and taking off regular upper body clothing?: A Little Help from another person to put on and taking off regular lower body clothing?: A Lot 6 Click Score: 17   End of Session    Activity Tolerance: Patient tolerated treatment well Patient left: in chair;with call bell/phone within reach;with family/visitor present  OT Visit Diagnosis: Unsteadiness on feet (R26.81);Pain Pain - Right/Left: Right Pain - part of body: Hip                Time: 0932-3557 OT Time Calculation (min): 47 min Charges:  OT General Charges $OT Visit: 1 Visit OT Evaluation $OT Eval Moderate Complexity: [place], OTR/L Acute Rehabilitation Services [phone] [facility] pager [phone] office 03/17/2018  Jerene Yeager 03/17/2018, 1:16 PM

## 2018-03-17 NOTE — Evaluation (Signed)
Physical Therapy Evaluation Patient Details Name: Mark Gallagher MRN: [id number] DOB: [birth year] Today's Date: 03/17/2018   History of Present Illness  R DA-THA with postop hypotension; PMH of acromegaly, spinal fusion, L foot drop (wears AFO)  Clinical Impression  Pt is s/p THA resulting in the deficits listed below (see PT Problem List). Pt ambulated 105' with RW, HR 127 max, SaO2 94% on room air, BP 103/58 standing. Initiated THA HEP. Good progress expected.  Pt will benefit from skilled PT to increase their independence and safety with mobility to allow discharge to the venue listed below.      Follow Up Recommendations Follow surgeon's recommendation for DC plan and follow-up therapies;Home health PT;Supervision for mobility/OOB    Equipment Recommendations  None recommended by PT    Recommendations for Other Services       Precautions / Restrictions Precautions Precautions: Fall Precaution Comments: monitor BP, L AFO at baseline Restrictions Weight Bearing Restrictions: No Other Position/Activity Restrictions: WBAT      Mobility  Bed Mobility Overal bed mobility: Needs Assistance Bed Mobility: Supine to Sit     Supine to sit: Min guard     General bed mobility comments: min/guard for RLE, HOB up 20*, used rail; pt sat edge of bed x 10 minutes 2* lightheadedness initially upon sitting but then resolved after a couple minutes (see flowsheets for orthostatics)  Transfers Overall transfer level: Needs assistance Equipment used: Rolling walker (2 wheeled) Transfers: Sit to/from Stand Sit to Stand: +2 safety/equipment;From elevated surface;Min assist         General transfer comment: VCs hand placement, min A to rise/steady  Ambulation/Gait Ambulation/Gait assistance: Min guard Gait Distance (Feet): 105 Feet Assistive device: Rolling walker (2 wheeled) Gait Pattern/deviations: Step-to pattern;Decreased dorsiflexion - left Gait velocity: decr   General Gait  Details: HR 127 max walking, SaO2 94% on RA, no loss of balance but mildly unsteady (pt reports poor balance PTA)  Stairs            Wheelchair Mobility    Modified Rankin (Stroke Patients Only)       Balance Overall balance assessment: Needs assistance Sitting-balance support: Feet supported Sitting balance-Leahy Scale: Fair     Standing balance support: Bilateral upper extremity supported Standing balance-Leahy Scale: Poor Standing balance comment: relies on BUE support                             Pertinent Vitals/Pain Pain Assessment: 0-10 Pain Score: 3  Pain Location: B knees (chronic) Pain Descriptors / Indicators: Sore;Aching Pain Intervention(s): Limited activity within patient's tolerance;Monitored during session;Premedicated before session;Ice applied(muscle cream applied prior to PT session)    Home Living Family/patient expects to be discharged to:: Private residence   Available Help at Discharge: Family   Home Access: Stairs to enter   [place] of Steps: 1 + 6 + 1 and flight to bedroom   Home Equipment: Tub bench;Grab bars - toilet;Walker - 2 wheels;Cane - quad;Other (comment)(L AFO) Additional Comments: parents live nearby: are with him 80% of time; can be there 24/7 initially    Prior Function Level of Independence: Independent with assistive device(s)         Comments: help with R shoe and sock; walks with quad cane and L AFO. Pt drives     Hand Dominance        Extremity/Trunk Assessment   Upper Extremity Assessment Upper Extremity Assessment: Overall WFL for tasks assessed  Lower Extremity Assessment Lower Extremity Assessment: LLE deficits/detail;RLE deficits/detail RLE Deficits / Details: hip flexion AROM ~30* RLE Sensation: WNL LLE Deficits / Details: h/o L foot drop    Cervical / Trunk Assessment Cervical / Trunk Assessment: Normal  Communication   Communication: No difficulties  Cognition  Arousal/Alertness: Awake/alert Behavior During Therapy: WFL for tasks assessed/performed Overall Cognitive Status: Within Functional Limits for tasks assessed                                        General Comments      Exercises Total Joint Exercises Ankle Circles/Pumps: AROM;Both;10 reps;Supine Quad Sets: AROM;Right;5 reps;Supine Heel Slides: AAROM;10 reps;Supine;Both   Assessment/Plan    PT Assessment Patient needs continued PT services  PT Problem List Decreased range of motion;Decreased strength;Decreased mobility;Decreased balance;Pain;Decreased knowledge of use of DME;Decreased activity tolerance       PT Treatment Interventions DME instruction;Gait training;Functional mobility training;Stair training;Therapeutic exercise;Therapeutic activities;Patient/family education    PT Goals (Current goals can be found in the Care Plan section)  Acute Rehab PT Goals Patient Stated Goal: go to church, visit friends PT Goal Formulation: With patient/family Time For Goal Achievement: 03/24/18 Potential to Achieve Goals: Good    Frequency 7X/week   Barriers to discharge        Co-evaluation   Reason for Co-Treatment: For patient/therapist safety PT goals addressed during session: Mobility/safety with mobility OT goals addressed during session: ADL's and self-care       AM-PAC PT "6 Clicks" Mobility  Outcome Measure Help needed turning from your back to your side while in a flat bed without using bedrails?: A Little Help needed moving from lying on your back to sitting on the side of a flat bed without using bedrails?: A Little Help needed moving to and from a bed to a chair (including a wheelchair)?: A Little Help needed standing up from a chair using your arms (e.g., wheelchair or bedside chair)?: A Little Help needed to walk in hospital room?: A Little Help needed climbing 3-5 steps with a railing? : A Lot 6 Click Score: 17    End of Session Equipment  Utilized During Treatment: Gait belt Activity Tolerance: Patient tolerated treatment well Patient left: in chair;with call bell/phone within reach;with family/visitor present;with nursing/sitter in room Nurse Communication: Mobility status PT Visit Diagnosis: Muscle weakness (generalized) (M62.81);Difficulty in walking, not elsewhere classified (R26.2);Pain Pain - Right/Left: Right Pain - part of body: Hip;Knee    Time: 3354-5625 PT Time Calculation (min) (ACUTE ONLY): 64 min   Charges:   PT Evaluation $PT Eval Moderate Complexity: 1 Mod PT Treatments $Gait Training: 8-22 mins        [name] PT 03/17/2018  Acute Rehabilitation Services Pager [phone] Office [phone]

## 2018-03-17 NOTE — Anesthesia Postprocedure Evaluation (Addendum)
Anesthesia Post Note  Patient: Mark Gallagher  Procedure(s) Performed: TOTAL HIP ARTHROPLASTY Anterior (Right Hip)     Patient location during evaluation: PACU Anesthesia Type: MAC Level of consciousness: awake and alert Pain management: pain level controlled Vital Signs Assessment: post-procedure vital signs reviewed and stable Respiratory status: spontaneous breathing and respiratory function stable Cardiovascular status: blood pressure returned to baseline and stable Anesthetic complications: no Comments: Pt remains on high dose phenylephrine, but BP stable. Discussed with RN at bedside, family and CCM team. Per the RN pt has had very little UOP overnight. None charted in flowsheets since OR and PACU. Discussed with CCM and they are increasing steroid dosing. PT to assess patient mobility as well.    Last Vitals:  Vitals:   03/17/18 0930 03/17/18 1100  BP: 99/62 (!) 89/60  Pulse: 83 84  Resp: 14 14  Temp:    SpO2: 99% 97%    Last Pain:  Vitals:   03/17/18 0915  TempSrc:   PainSc: [place]

## 2018-03-17 NOTE — Progress Notes (Signed)
Pt BP remaining low (systolic in 16R and Map in 50s) despite neo being titrated up. RN called [facility] and spoke with Dr. Elsworth Soho about pt condition and BP, and he put in orders for an A-line to get an accurate BP. Will follow up with Dr. Elsworth Soho once line gets placed. Neo currently at 115 going through a peripheral IV. Peripheral site remains intact with no changes to surrounding skin.

## 2018-03-17 NOTE — Progress Notes (Signed)
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  Status post right total hip via anterior approach on 2/25 with a EBL at 1.5 L.  Postoperatively was hypotensive requiring both crystalloid and blood replacement.  Was transfused, received hydrocortisone total of 150 mg throughout that day, and in spite of this remained hypotensive.  Because of this pulmonary was asked to evaluate.  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   reports that he has never smoked. He has never used smokeless tobacco.  Significant Hospital Events   03/16/2018 - admit for elective hip replacement 2/26: Hemoglobin stable.  Remains on Neo-Synephrine.  Stress dose steroids increased to hydrocortisone every 6 hours instead of daily  Consults:  2/25 - ccm consult for hypotension  Procedures:  2/25 - hip replacement  Significant Diagnostic Tests:    Micro Data:    Antimicrobials:    Interim history/subjective:  2/25 - ccm consult 2/26: Remains on Neo-Synephrine, lactic acid remains normal, CBC stable overnight.  Increasing stress dose steroids  Objective   Blood pressure 99/62, pulse 83, temperature 98.7 F (37.1 C), temperature source Oral, resp. rate 14, height 6\' 7"  (2.007 m), weight 107.2 kg, SpO2 99 %.        Intake/Output Summary (Last 24 hours) at 03/17/2018 0948 Last data filed at 03/17/2018 0258 Gross per 24 hour  Intake 9533.39 ml  Output 2500 ml  Net 7033.39 ml   Filed Weights   03/17/18 0605  Weight: 107.2 kg    Examination: General:  48 year old male patient currently resting in bed he has expected postoperative right hip pain HEENT notable for acromegaly.  No neck vein distention, mucous membranes are moist. Pulmonary: Clear to auscultation without accessory use Cardiac: Regular rate and rhythm without murmur rub or gallop Abdomen: Soft, not tender, positive bowel sounds GU: Voiding Neuro: Awake, oriented, no focal deficits. Musculoskeletal: Right hip discomfort as expected.  Some tenderness to palpation of the right hip area, CMS is intact   Resolved Hospital Problem list     Assessment & Plan:   Persistent circulatory shock.  Given hemoglobin stable would favor adrenal insufficiency. -Troponins negative  -Hemoglobin stable overnight  plan Continue maintenance fluids Keep hemoglobin greater than 7, or transfuse if drops significantly over the course of the day Will increase stress dose steroids to 50 mg of hydrocortisone every 6 hours Continue to titrate Neo-Synephrine for mean arterial goal greater than 65  Status post anterior right hip replacement Plan Per orthopedics Need to mobilize  Acute blood loss anemia Hemoglobin stable overnight Plan Trend CBC  Fluid and electrolyte imbalance: Hypomagnesemia Plan Replace and recheck  History of pituitary tumor with panhypopituitary  He sees endocrinology at [facility], last seen January 14 he is on multiple replacement therapy.  Of note he was on subtherapeutic adrenal replacement at baseline Plan Continue Synthroid replacement Stress dose steroids as mentioned above Follow-up endocrine at [facility] I am not convinced we need additional endocrinology here unless remains hypotensive in the next 24 to 48 hours  Steroid-induced hyperglycemia Plan Trend glucose  Best practice:  Diet: He wants to eat.  Will allow him to have meds and sips but otherwise n.p.o. Pain/Anxiety/Delirium protocol (if indicated): Monitor VAP protocol (if indicated): Head of bed greater  than 30 degrees DVT prophylaxis: SCD GI prophylaxis: Protonix Glucose control: Monitor SSI Mobility: Bedrest Code Status: Full Family Communication: Updated patient in the PACU Disposition: Continue critical care services.  Most importantly trending CBC to ensure no further bleeding.  Continue maintenance IV fluids, start to mobilize him.  Titrate Neo-Synephrine.  Have adjusted stress dose steroids    My critical care x32 minutes Erick Colace ACNP-BC [place] Pager # [phone] OR # [phone] if no answer    03/17/2018 9:48 AM

## 2018-03-17 NOTE — Progress Notes (Signed)
[place] Progress Note Patient Name: DIAMANTE RUBIN DOB: 06/27/1970 MRN: [id number]   Date of Service  03/17/2018  HPI/Events of Note  Remains hypotensive Neo being titrated up Hb stable after 1 U PRBC  eICU Interventions  Place a -line     Intervention Category Major Interventions: Hypotension - evaluation and management  Julizza Sassone V. Adal Sereno 03/17/2018, 5:12 AM

## 2018-03-17 NOTE — Addendum Note (Signed)
Addendum  created 03/17/18 1208 by Nolon Nations, MD   Clinical Note Signed

## 2018-03-18 ENCOUNTER — Encounter (HOSPITAL_COMMUNITY): Payer: Self-pay | Admitting: Orthopedic Surgery

## 2018-03-18 LAB — CBC WITH DIFFERENTIAL/PLATELET
Abs Immature Granulocytes: 0.02 10*3/uL (ref 0.00–0.07)
Basophils Absolute: 0 10*3/uL (ref 0.0–0.1)
Basophils Relative: 0 %
Eosinophils Absolute: 0 10*3/uL (ref 0.0–0.5)
Eosinophils Relative: 0 %
HCT: 25.3 % — ABNORMAL LOW (ref 39.0–52.0)
Hemoglobin: 8.1 g/dL — ABNORMAL LOW (ref 13.0–17.0)
Immature Granulocytes: 0 %
Lymphocytes Relative: 10 %
Lymphs Abs: 0.7 10*3/uL (ref 0.7–4.0)
MCH: 31.9 pg (ref 26.0–34.0)
MCHC: 32 g/dL (ref 30.0–36.0)
MCV: 99.6 fL (ref 80.0–100.0)
Monocytes Absolute: 0.8 10*3/uL (ref 0.1–1.0)
Monocytes Relative: 10 %
Neutro Abs: 6.2 10*3/uL (ref 1.7–7.7)
Neutrophils Relative %: 80 %
Platelets: 113 10*3/uL — ABNORMAL LOW (ref 150–400)
RBC: 2.54 MIL/uL — ABNORMAL LOW (ref 4.22–5.81)
RDW: 13.9 % (ref 11.5–15.5)
WBC: 7.8 10*3/uL (ref 4.0–10.5)
nRBC: 0 % (ref 0.0–0.2)

## 2018-03-18 LAB — CBC
HCT: 24.8 % — ABNORMAL LOW (ref 39.0–52.0)
Hemoglobin: 8.1 g/dL — ABNORMAL LOW (ref 13.0–17.0)
MCH: 32.5 pg (ref 26.0–34.0)
MCHC: 32.7 g/dL (ref 30.0–36.0)
MCV: 99.6 fL (ref 80.0–100.0)
Platelets: 102 10*3/uL — ABNORMAL LOW (ref 150–400)
RBC: 2.49 MIL/uL — ABNORMAL LOW (ref 4.22–5.81)
RDW: 13.7 % (ref 11.5–15.5)
WBC: 6.6 10*3/uL (ref 4.0–10.5)
nRBC: 0 % (ref 0.0–0.2)

## 2018-03-18 LAB — GLUCOSE, CAPILLARY
Glucose-Capillary: 119 mg/dL — ABNORMAL HIGH (ref 70–99)
Glucose-Capillary: 107 mg/dL — ABNORMAL HIGH (ref 70–99)
Glucose-Capillary: 161 mg/dL — ABNORMAL HIGH (ref 70–99)
Glucose-Capillary: 125 mg/dL — ABNORMAL HIGH (ref 70–99)
Glucose-Capillary: 107 mg/dL — ABNORMAL HIGH (ref 70–99)
Glucose-Capillary: 150 mg/dL — ABNORMAL HIGH (ref 70–99)

## 2018-03-18 LAB — PREPARE RBC (CROSSMATCH)

## 2018-03-18 LAB — BASIC METABOLIC PANEL
Anion gap: 3 — ABNORMAL LOW (ref 5–15)
BUN: 17 mg/dL (ref 6–20)
CO2: 27 mmol/L (ref 22–32)
Calcium: 8 mg/dL — ABNORMAL LOW (ref 8.9–10.3)
Chloride: 108 mmol/L (ref 98–111)
Creatinine, Ser: 0.68 mg/dL (ref 0.61–1.24)
GFR calc Af Amer: >60 mL/min (ref >60)
GFR calc non Af Amer: >60 mL/min (ref >60)
Glucose, Bld: 137 mg/dL — ABNORMAL HIGH (ref 70–99)
Potassium: 3.9 mmol/L (ref 3.5–5.1)
Sodium: 138 mmol/L (ref 135–145)

## 2018-03-18 LAB — MAGNESIUM: Magnesium: 2.2 mg/dL (ref 1.7–2.4)

## 2018-03-18 LAB — PHOSPHORUS: Phosphorus: 2.4 mg/dL — ABNORMAL LOW (ref 2.5–4.6)

## 2018-03-18 MED ORDER — POTASSIUM PHOSPHATES 15 MMOLE/5ML IV SOLN
10.0000 mmol | Freq: Once | INTRAVENOUS | Status: AC
  Administered 2018-03-18: 10 mmol via INTRAVENOUS
  Filled 2018-03-18: qty 3.33

## 2018-03-18 MED ORDER — SODIUM CHLORIDE 0.9% IV SOLUTION: Freq: Once | INTRAVENOUS | Status: DC

## 2018-03-18 NOTE — Progress Notes (Signed)
Occupational Therapy Treatment Patient Details Name: Mark Gallagher MRN: [id number] DOB: Aug 04, 1970 Today's Date: 03/18/2018    History of present illness R DA-THA with postop hypotension; PMH of acromegaly, spinal fusion, L foot drop (wears AFO)   OT comments  Educated on AE and theraband exercises  Follow Up Recommendations  Supervision/Assistance - 24 hour    Equipment Recommendations  3 in 1 bedside commode    Recommendations for Other Services      Precautions / Restrictions Precautions Precautions: Fall Precaution Comments: monitor BP, L AFO at baseline Restrictions Weight Bearing Restrictions: No Other Position/Activity Restrictions: WBAT       Mobility Bed Mobility BY PT Overal bed mobility: Needs Assistance Bed Mobility: Supine to Sit     Supine to sit: Min guard     General bed mobility comments: min/guard, HOB elevated ~ 60*, incr time, pt preference to sit EOB to pt left side; sat EOB x4 mins d/t dizziness  Transfers BY PT Overall transfer level: Needs assistance Equipment used: Rolling walker (2 wheeled) Transfers: Sit to/from Stand Sit to Stand: Min assist;+2 safety/equipment;From elevated surface         General transfer comment: VCs hand placement, min A to rise/steady    Balance                                           ADL either performed or assessed with clinical judgement   ADL Overall ADL's : Needs assistance/impaired                                       General ADL Comments: used AE from seated position:  reacher for pants (pillowcase) and sock aide (wide).  Pt's foot barely fits into sock aide and it did not pull up completely.  He was able to reach to mid calf comfortably.  Encouraged pt to do as much as he can for himself.  Also offered him level 2 theraband to work on UE strengthening.  See below     Vision       Perception     Praxis      Cognition Arousal/Alertness:  Awake/alert Behavior During Therapy: WFL for tasks assessed/performed Overall Cognitive Status: Within Functional Limits for tasks assessed                                          Exercises    Shoulder Instructions       General Comments      Pertinent Vitals/ Pain       Pain Assessment: 0-10 Pain Score: 4  Pain Location: B knees (chronic); R hip Pain Descriptors / Indicators: Sore;Aching Pain Intervention(s): Limited activity within patient's tolerance;Monitored during session;Repositioned;Premedicated before session  Home Living                                          Prior Functioning/Environment              Frequency  Min 2X/week        Progress Toward Goals  OT Goals(current goals can now be found  in the care plan section)  Progress towards OT goals: Progressing toward goals  Acute Rehab OT Goals Patient Stated Goal: go to church, visit friends  Plan      Co-evaluation                 AM-PAC OT "6 Clicks" Daily Activity     Outcome Measure   Help from another person eating meals?: None Help from another person taking care of personal grooming?: A Little Help from another person toileting, which includes using toliet, bedpan, or urinal?: A Little Help from another person bathing (including washing, rinsing, drying)?: A Lot Help from another person to put on and taking off regular upper body clothing?: A Little Help from another person to put on and taking off regular lower body clothing?: A Lot 6 Click Score: 17    End of Session    OT Visit Diagnosis: Unsteadiness on feet (R26.81);Pain Pain - Right/Left: Right Pain - part of body: Hip   Activity Tolerance Patient tolerated treatment well   Patient Left in chair;with call bell/phone within reach;with family/visitor present   Nurse Communication          Time: 5003-7048 OT Time Calculation (min): 30 min  Charges: OT General Charges $OT  Visit: 1 Visit OT Treatments $Self Care/Home Management : 8-22 mins $Therapeutic Exercise: 8-22 mins  Lesle Chris, OTR/L Acute Rehabilitation Services [phone] [facility] pager [phone] office 03/18/2018   [place] 03/18/2018, 3:47 PM

## 2018-03-18 NOTE — Progress Notes (Signed)
Hospital Course: 03/16/18: Mr. Barfoot is a 48 year old male with acromegaly/complex endocrine history followed by Dr. Jerl Mina at [facility].  The patient underwent right total hip arthroplasty yesterday morning.  He received preoperative stress dose steroids.  He was stable throughout the procedure and had about 1100 mL blood loss.  Postoperatively he had significant hypotension requiring Neo-Synephrine thought to be a combination of blood loss and his complex endocrine condition.  He was given additional steroid and CCM was consulted and the patient was transferred to the ICU.  He was given albumin and received 2 units PRBC.  His hemoglobin has remained stable without sign of active blood loss.  03/17/18: Improved clinically.  Decreased need for blood pressure support with Neo-Synephrine.  Good early mobilization.  Subjective: Patient reports pain as moderate and improving.  Pain in hip joint less than before surgery.  Tolerating diet.   No CP, SOB.  Good early mobilization.  Objective:   VITALS:   Vitals:   03/18/18 0600 03/18/18 0745 03/18/18 0800 03/18/18 0815  BP: 99/68 96/66 106/73 106/61  Pulse: 71 88 80 95  Resp: 11 18 20 17   Temp:      TempSrc:      SpO2: 100% 98% 98% 98%  Weight:      Height:       CBC Latest Ref Rng & Units 03/18/2018 03/17/2018 03/17/2018  WBC 4.0 - 10.5 K/uL 7.8 7.2 9.3  Hemoglobin 13.0 - 17.0 g/dL 8.1(L) 9.3(L) 9.7(L)  Hematocrit 39.0 - 52.0 % 25.3(L) 29.2(L) 28.8(L)  Platelets 150 - 400 K/uL 113(L) 127(L) 130(L)   BMP Latest Ref Rng & Units 03/17/2018 03/16/2018 03/09/2018  Glucose 70 - 99 mg/dL 169(H) 140(H) 128(H)  BUN 6 - 20 mg/dL 24(H) 21(H) 21(H)  Creatinine 0.61 - 1.24 mg/dL 0.72 0.56(L) 0.66  Sodium 135 - 145 mmol/L 137 139 138  Potassium 3.5 - 5.1 mmol/L 3.6 3.5 3.7  Chloride 98 - 111 mmol/L 107 107 104  CO2 22 - 32 mmol/L 23 23 25   Calcium 8.9 - 10.3 mg/dL 8.2(L) 8.4(L) 9.2   Intake/Output      02/26 0701 - 02/27 0700 02/27 0701 - 02/28  0700   P.O.     I.V. (mL/kg) 5175.2 (48.3)    Blood     IV Piggyback 606.6    Total Intake(mL/kg) 5781.8 (53.9)    Urine (mL/kg/hr) 3450 (1.3)    Blood     Total Output 3450    Net +2331.8            Physical Exam: General: NAD.  Upright in bed.  Calm, conversant.   Resp: No increased wob Cardio: regular rate and rhythm ABD soft Neurologically intact MSK RLE: Neurovascularly intact Sensation intact distally Feet warm and well-perfused. Dorsiflexion/Plantar/EHL/FHL intact.  Chronic Left sided foot drop. Incision: dressing C/D/I   Assessment / Plan: 2 Days Post-Op  S/P Procedure(s) (LRB): TOTAL HIP ARTHROPLASTY Anterior (Right) by Dr. Ernesta Amble. 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    VTE prophylaxis: Aspirin, SCDs, ambulation  Postoperative Circulatory Shock  Secondary to blood loss and adrenal stress  Improving.  Weaning neo.  Evaluation / Treatment ongoing by CCM.    ABLA: S/p 2 units PRBC.  No sign of active bleeding.  Hgb down this morning likely combination of surgical blood loss and hemodilution with management of hypotension.  Starting to diurese.  Dispo: Remain inpatient possibly transfer to stepdown per CCM recommendations.  Prudencio Burly III, PA-C 03/18/2018, 8:56 AM

## 2018-03-18 NOTE — Progress Notes (Signed)
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  Status post right total hip via anterior approach on 2/25 with a EBL at 1.5 L.  Postoperatively was hypotensive requiring both crystalloid and blood replacement.  Was transfused, received hydrocortisone total of 150 mg throughout that day, and in spite of this remained hypotensive.  Because of this pulmonary was asked to evaluate.  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   reports that he has never smoked. He has never used smokeless tobacco.  Significant Hospital Events   03/16/2018 - admit for elective hip replacement 2/26: Hemoglobin stable.  Remains on Neo-Synephrine.  Stress dose steroids increased to hydrocortisone every 6 hours instead of daily  Consults:  2/25 - ccm consult for hypotension  Procedures:  2/25 - hip replacement  Significant Diagnostic Tests:  DG Pelvis 2/26 > Changes of right hip replacement. Cortical irregularity in the lateral inferior right iliac bone just superior to the acetabulum. Cannot exclude fracture. Recommend dedicated right hip images  Micro Data:    Antimicrobials:  Ancef 2/25   Interim history/subjective:  2/25 - ccm consult 2/26: Remains on Neo-Synephrine, lactic acid remains normal, CBC stable overnight.  Increasing stress dose steroids  Objective   Blood pressure 106/61, pulse 95, temperature 98.2 F (36.8 C), temperature source Oral, resp. rate 17, height 6\' 7"  (2.007 m), weight 107.2 kg, SpO2 98 %.        Intake/Output Summary (Last 24  hours) at 03/18/2018 1041 Last data filed at 03/18/2018 0659 Gross per 24 hour  Intake 4840.79 ml  Output 3450 ml  Net 1390.79 ml   Filed Weights   03/17/18 1914  Weight: 107.2 kg    Examination: General: Adult male, lying in bed, no distress  HEENT notable for acromegaly. MMM  Pulmonary: Clear breath sounds, no wheeze/crackles  Cardiac: RRR, no MRG  Abdomen: Soft, not tender, positive bowel sounds GU: intact  Neuro: Awake, oriented, no focal deficits. Musculoskeletal: Right hip surgical incision > intact    Resolved Hospital Problem list     Assessment & Plan:   Persistent circulatory shock. Hypovolemia in setting of blood loss anemia vs adrenal insufficiency. -Troponins negative  -Hemoglobin stable overnight plan Cardiac Monitoring Titrate NEO to maintain MAP >65 Solu-Cortef 50 N8G  Chronic Systolic Heart Failure  H/O Typical A.Flutter s/p Ablation  Plan -Given Current Hypotension will hold Metoprolol, Cozzar, eplerenone (Was placed on Aldactone in hospital will D/C this for now given continued pressors needs)   S/P anterior right hip replacement Plan Per orthopedics Mobilize, PT/OT   Acute blood loss anemia Down-trending  Plan Trend CBC (Hemoglobin 8.1, repeat 1400)  Continue to hold anticoagulation  Transfuse for hemoglobin <7 (if repeat at 1400 is less than 8.1 will go ahead and order one unit RBC)   Fluid and electrolyte imbalance: Hypomagnesemia Plan Trend BMP   History of pituitary tumor with panhypopituitary  He sees endocrinology at [facility], last seen January 14 he is on multiple replacement therapy.  Of note he was on subtherapeutic adrenal replacement at baseline Plan Continue Synthroid replacement Stress dose steroids as mentioned  above Follow-up endocrine at [facility] May need endocrinology consult if remains hypotensive in the next 24 to 48 hours  Steroid-induced hyperglycemia Plan Trend glucose  Best practice:  Diet: Regular  DVT  prophylaxis: SCD GI prophylaxis: Protonix Glucose control: Monitor SSI Mobility: Bedrest Code Status: Full Family Communication: Parents updated at bedside  Disposition: Continue critical care services.   CC Time: 32 minutes   Hayden Pedro, AGACNP-BC [facility]  Pgr: [phone]  PCCM Pgr: [phone]

## 2018-03-18 NOTE — Progress Notes (Signed)
Physical Therapy Treatment Patient Details Name: Mark Gallagher MRN: [id number] DOB: 02-05-1970 Today's Date: 03/18/2018    History of Present Illness R DA-THA with postop hypotension; PMH of acromegaly, spinal fusion, L foot drop (wears AFO)    PT Comments    Pt progressing toward PT goals; BP supine 115/65, sitting 111/58 (with c/o mild dizziness), BP after amb 107/70; HR max 135, SpO2 >94% on RA; pt reports being fatigued after amb however pain improved with mobility   Follow Up Recommendations  Follow surgeon's recommendation for DC plan and follow-up therapies;Home health PT;Supervision for mobility/OOB     Equipment Recommendations  None recommended by PT    Recommendations for Other Services       Precautions / Restrictions Precautions Precautions: Fall Precaution Comments: monitor BP, L AFO at baseline Restrictions Weight Bearing Restrictions: No Other Position/Activity Restrictions: WBAT    Mobility  Bed Mobility Overal bed mobility: Needs Assistance Bed Mobility: Supine to Sit     Supine to sit: Min guard     General bed mobility comments: min/guard, HOB elevated ~ 60*, incr time, pt preference to sit EOB to pt left side; sat EOB x4 mins d/t dizziness  Transfers Overall transfer level: Needs assistance Equipment used: Rolling walker (2 wheeled) Transfers: Sit to/from Stand Sit to Stand: Min assist;+2 safety/equipment;From elevated surface         General transfer comment: VCs hand placement, min A to rise/steady  Ambulation/Gait Ambulation/Gait assistance: Min guard Gait Distance (Feet): 90 Feet Assistive device: Rolling walker (2 wheeled) Gait Pattern/deviations: Step-to pattern;Decreased dorsiflexion - left Gait velocity: decr   General Gait Details: cues for sequence and RW position from self; HR max 135, SpO2 >94% on RA. mildly unsteady however no overt LOB   Stairs             Wheelchair Mobility    Modified Rankin (Stroke  Patients Only)       Balance                                            Cognition Arousal/Alertness: Awake/alert Behavior During Therapy: WFL for tasks assessed/performed Overall Cognitive Status: Within Functional Limits for tasks assessed                                        Exercises Total Joint Exercises Ankle Circles/Pumps: AROM;Right;10 reps Quad Sets: AROM;Right;10 reps    General Comments        Pertinent Vitals/Pain Pain Assessment: 0-10 Pain Score: 4  Pain Location: B knees (chronic); R hip Pain Descriptors / Indicators: Sore;Aching Pain Intervention(s): Limited activity within patient's tolerance;Monitored during session;Repositioned;Premedicated before session    Home Living                      Prior Function            PT Goals (current goals can now be found in the care plan section) Acute Rehab PT Goals Patient Stated Goal: go to church, visit friends PT Goal Formulation: With patient/family Time For Goal Achievement: 03/24/18 Potential to Achieve Goals: Good Progress towards PT goals: Progressing toward goals    Frequency    7X/week      PT Plan Current plan remains appropriate    Co-evaluation  AM-PAC PT "6 Clicks" Mobility   Outcome Measure  Help needed turning from your back to your side while in a flat bed without using bedrails?: A Little Help needed moving from lying on your back to sitting on the side of a flat bed without using bedrails?: A Little Help needed moving to and from a bed to a chair (including a wheelchair)?: A Little Help needed standing up from a chair using your arms (e.g., wheelchair or bedside chair)?: A Little Help needed to walk in hospital room?: A Little Help needed climbing 3-5 steps with a railing? : A Lot 6 Click Score: 17    End of Session Equipment Utilized During Treatment: Gait belt Activity Tolerance: Patient tolerated treatment  well;Other (comment)(distance limited d/t elevated HR) Patient left: in chair;with call bell/phone within reach;with family/visitor present;Other (comment)(OT) Nurse Communication: Mobility status PT Visit Diagnosis: Muscle weakness (generalized) (M62.81);Difficulty in walking, not elsewhere classified (R26.2);Pain Pain - Right/Left: Right Pain - part of body: Hip;Knee     Time: 1425-1501 PT Time Calculation (min) (ACUTE ONLY): 36 min  Charges:  $Gait Training: 23-37 mins                     Kenyon Ana, PT  Pager: [phone] Acute Rehab Dept [facility]): [phone]   03/18/2018    [facility] 03/18/2018, 3:12 PM

## 2018-03-19 LAB — GLUCOSE, CAPILLARY
Glucose-Capillary: 116 mg/dL — ABNORMAL HIGH (ref 70–99)
Glucose-Capillary: 114 mg/dL — ABNORMAL HIGH (ref 70–99)
Glucose-Capillary: 139 mg/dL — ABNORMAL HIGH (ref 70–99)
Glucose-Capillary: 121 mg/dL — ABNORMAL HIGH (ref 70–99)
Glucose-Capillary: 120 mg/dL — ABNORMAL HIGH (ref 70–99)
Glucose-Capillary: 149 mg/dL — ABNORMAL HIGH (ref 70–99)
Glucose-Capillary: 119 mg/dL — ABNORMAL HIGH (ref 70–99)

## 2018-03-19 LAB — BASIC METABOLIC PANEL
Anion gap: 5 (ref 5–15)
BUN: 15 mg/dL (ref 6–20)
CO2: 27 mmol/L (ref 22–32)
Calcium: 8.1 mg/dL — ABNORMAL LOW (ref 8.9–10.3)
Chloride: 107 mmol/L (ref 98–111)
Creatinine, Ser: 0.6 mg/dL — ABNORMAL LOW (ref 0.61–1.24)
GFR calc Af Amer: >60 mL/min (ref >60)
GFR calc non Af Amer: >60 mL/min (ref >60)
Glucose, Bld: 135 mg/dL — ABNORMAL HIGH (ref 70–99)
Potassium: 3.6 mmol/L (ref 3.5–5.1)
Sodium: 139 mmol/L (ref 135–145)

## 2018-03-19 LAB — CBC
HCT: 26 % — ABNORMAL LOW (ref 39.0–52.0)
Hemoglobin: 8.1 g/dL — ABNORMAL LOW (ref 13.0–17.0)
MCH: 31.3 pg (ref 26.0–34.0)
MCHC: 31.2 g/dL (ref 30.0–36.0)
MCV: 100.4 fL — ABNORMAL HIGH (ref 80.0–100.0)
Platelets: 118 10*3/uL — ABNORMAL LOW (ref 150–400)
RBC: 2.59 MIL/uL — ABNORMAL LOW (ref 4.22–5.81)
RDW: 13.8 % (ref 11.5–15.5)
WBC: 7.7 10*3/uL (ref 4.0–10.5)
nRBC: 0 % (ref 0.0–0.2)

## 2018-03-19 LAB — CBC WITH DIFFERENTIAL/PLATELET
Abs Immature Granulocytes: 0.05 10*3/uL (ref 0.00–0.07)
Basophils Absolute: 0 10*3/uL (ref 0.0–0.1)
Basophils Relative: 0 %
Eosinophils Absolute: 0 10*3/uL (ref 0.0–0.5)
Eosinophils Relative: 0 %
HCT: 23.4 % — ABNORMAL LOW (ref 39.0–52.0)
Hemoglobin: 7.4 g/dL — ABNORMAL LOW (ref 13.0–17.0)
Immature Granulocytes: 1 %
Lymphocytes Relative: 12 %
Lymphs Abs: 0.8 10*3/uL (ref 0.7–4.0)
MCH: 31.4 pg (ref 26.0–34.0)
MCHC: 31.6 g/dL (ref 30.0–36.0)
MCV: 99.2 fL (ref 80.0–100.0)
Monocytes Absolute: 0.7 10*3/uL (ref 0.1–1.0)
Monocytes Relative: 10 %
Neutro Abs: 5.1 10*3/uL (ref 1.7–7.7)
Neutrophils Relative %: 77 %
Platelets: 98 10*3/uL — ABNORMAL LOW (ref 150–400)
RBC: 2.36 MIL/uL — ABNORMAL LOW (ref 4.22–5.81)
RDW: 13.8 % (ref 11.5–15.5)
WBC: 6.7 10*3/uL (ref 4.0–10.5)
nRBC: 0 % (ref 0.0–0.2)

## 2018-03-19 LAB — PHOSPHORUS: Phosphorus: 2.1 mg/dL — ABNORMAL LOW (ref 2.5–4.6)

## 2018-03-19 LAB — MAGNESIUM: Magnesium: 1.9 mg/dL (ref 1.7–2.4)

## 2018-03-19 MED ORDER — CHLORHEXIDINE GLUCONATE CLOTH 2 % EX PADS
6.0000 | MEDICATED_PAD | Freq: Every day | CUTANEOUS | Status: DC
  Administered 2018-03-20 – 2018-03-23 (×3): 6 via TOPICAL

## 2018-03-19 MED ORDER — POTASSIUM PHOSPHATES 15 MMOLE/5ML IV SOLN
20.0000 mmol | Freq: Once | INTRAVENOUS | Status: AC
  Administered 2018-03-19: 20 mmol via INTRAVENOUS
  Filled 2018-03-19: qty 6.67

## 2018-03-19 MED ORDER — SODIUM CHLORIDE 0.9% IV SOLUTION: Freq: Once | INTRAVENOUS | Status: DC

## 2018-03-19 MED ORDER — MAGNESIUM SULFATE 2 GM/50ML IV SOLN
2.0000 g | Freq: Once | INTRAVENOUS | Status: AC
  Administered 2018-03-19: 2 g via INTRAVENOUS
  Filled 2018-03-19: qty 50

## 2018-03-19 NOTE — Progress Notes (Signed)
Hospital Course: 03/16/18: [name] is a 48 year old male with acromegaly/complex endocrine history followed by Dr. Jerl Mina at [facility].  The patient underwent right total hip arthroplasty yesterday morning.  He received preoperative stress dose steroids.  He was stable throughout the procedure and had about 1100 mL blood loss.  Postoperatively he had significant hypotension requiring Neo-Synephrine thought to be a combination of blood loss and his complex endocrine condition.  He was given additional steroid and CCM was consulted and the patient was transferred to the ICU.  He was given albumin and received 2 units PRBC.  His hemoglobin has remained stable without sign of active blood loss.  03/17/18: Improved clinically.  Decreased need for blood pressure support with Neo-Synephrine.  Good early mobilization.  03/19/18: Continue clinical improvement.  Weaned off of Neo-Synephrine.  On Solu-Cortef 50 mg every 6 hours.  Hemoglobin continued downtrend.  No sign of hemorrhage.  Subjective: Patient reports pain as moderate and improving. Tolerating diet.   No CP, SOB.  Continuing to mobilize.   Objective:   VITALS:   Vitals:   03/19/18 0342 03/19/18 0400 03/19/18 0600 03/19/18 0700  BP:  100/64 (!) 104/50 106/73  Pulse:  71 (!) 39 80  Resp:  13 13 15   Temp: 98.8 F (37.1 C)     TempSrc: Oral     SpO2:  99% 100% 99%  Weight:      Height:       CBC Latest Ref Rng & Units 03/19/2018 03/18/2018 03/18/2018  WBC 4.0 - 10.5 K/uL 6.7 6.6 7.8  Hemoglobin 13.0 - 17.0 g/dL 7.4(L) 8.1(L) 8.1(L)  Hematocrit 39.0 - 52.0 % 23.4(L) 24.8(L) 25.3(L)  Platelets 150 - 400 K/uL 98(L) 102(L) 113(L)   BMP Latest Ref Rng & Units 03/19/2018 03/18/2018 03/17/2018  Glucose 70 - 99 mg/dL 135(H) 137(H) 169(H)  BUN 6 - 20 mg/dL 15 17 24(H)  Creatinine 0.61 - 1.24 mg/dL 0.60(L) 0.68 0.72  Sodium 135 - 145 mmol/L 139 138 137  Potassium 3.5 - 5.1 mmol/L 3.6 3.9 3.6  Chloride 98 - 111 mmol/L 107 108 107  CO2 22  - 32 mmol/L 27 27 23   Calcium 8.9 - 10.3 mg/dL 8.1(L) 8.0(L) 8.2(L)   Intake/Output      02/27 0701 - 02/28 0700 02/28 0701 - 02/29 0700   I.V. (mL/kg) 101.1 (0.9)    IV Piggyback 196.5    Total Intake(mL/kg) 297.6 (2.8)    Urine (mL/kg/hr) 2100 (0.8)    Total Output 2100    Net -1802.4            Physical Exam: General: NAD.  Upright in bed.  Calm, conversant.   Resp: No increased wob Cardio: regular rate and rhythm ABD soft Neurologically intact MSK RLE: Neurovascularly intact Sensation intact distally Feet warm and well-perfused. Dorsiflexion/Plantar/EHL/FHL intact.  Chronic Left sided foot drop. Incision: dressing C/D/I   Assessment / Plan: 3 Days Post-Op  S/P Procedure(s) (LRB): TOTAL HIP ARTHROPLASTY Anterior (Right) by Dr. Ernesta Amble. 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    VTE prophylaxis: Aspirin, SCDs, ambulation  Postoperative Circulatory Shock  Secondary to blood loss and adrenal stress  Improving.  Weaned off of neo-Synephrine.  Still on Solu-Cortef 50 mg every 6 hours.  Evaluation / Treatment ongoing by CCM.    Appreciate input regarding recommended endocrine follow-up and or changes to his endocrine/BP medicines on discharge.  ABLA: S/p 2 units PRBC.  No sign of active bleeding.  Hgb downtrending.  7.4<8.1<9.7<10.6.   Dispo: Remain inpatient continue care per CCM recommendations.  Mark Elizabeth Martensen III, PA-C 03/19/2018, 7:27 AM

## 2018-03-19 NOTE — Progress Notes (Signed)
Occupational Therapy Treatment Patient Details Name: Mark Gallagher MRN: [id number] DOB: [birth year] Today's Date: 03/19/2018    History of present illness R DA-THA with postop hypotension; PMH of acromegaly, spinal fusion, L foot drop (wears AFO)   OT comments  Pt is making good progress with OT.  Practiced ambulating to bathroom and getting on/off 3:1. Pt would like to be able get up from comfort height commode at home, if possible  Follow Up Recommendations  Supervision/Assistance - 24 hour    Equipment Recommendations  3 in 1 bedside commode    Recommendations for Other Services      Precautions / Restrictions Precautions Precautions: Fall Precaution Comments: monitor BP, L AFO at baseline--has been walking without this Restrictions Other Position/Activity Restrictions: WBAT       Mobility Bed Mobility         Supine to sit: Min assist     General bed mobility comments: light assist for RLE  Transfers   Equipment used: Rolling walker (2 wheeled)   Sit to Stand: Min assist         General transfer comment: light assistance to stand/steady    Balance                                           ADL either performed or assessed with clinical judgement   ADL                           Toilet Transfer: Minimal assistance;Ambulation;BSC;RW             General ADL Comments: ambulated to bathroom; got on and off commode with min A (3:1 over commode at highest setting).  Ambulated in hall 208'--see PT's note), with 2 standing breaks.  Pt has a grab bar in front of him and vanity next to him with comfort height commode     Vision       Perception     Praxis      Cognition Arousal/Alertness: Awake/alert Behavior During Therapy: WFL for tasks assessed/performed Overall Cognitive Status: Within Functional Limits for tasks assessed                                          Exercises Other Exercises Other  Exercises: pt is performing theraband exercises on his own   Shoulder Instructions       General Comments      Pertinent Vitals/ Pain       Pain Score: 4  Pain Location: B knees (chronic); R hip Pain Descriptors / Indicators: Sore;Aching Pain Intervention(s): Limited activity within patient's tolerance;Monitored during session;Repositioned;Premedicated before session  Home Living                                          Prior Functioning/Environment              Frequency  Min 2X/week        Progress Toward Goals  OT Goals(current goals can now be found in the care plan section)  Progress towards OT goals: Progressing toward goals     Plan      Co-evaluation  Reason for Co-Treatment: For patient/therapist safety PT goals addressed during session: Mobility/safety with mobility OT goals addressed during session: ADL's and self-care      AM-PAC OT "6 Clicks" Daily Activity     Outcome Measure   Help from another person eating meals?: None Help from another person taking care of personal grooming?: A Little Help from another person toileting, which includes using toliet, bedpan, or urinal?: A Little Help from another person bathing (including washing, rinsing, drying)?: A Little Help from another person to put on and taking off regular upper body clothing?: A Little Help from another person to put on and taking off regular lower body clothing?: A Lot 6 Click Score: 18    End of Session    OT Visit Diagnosis: Unsteadiness on feet (R26.81);Pain Pain - Right/Left: Right Pain - part of body: Hip   Activity Tolerance Patient tolerated treatment well   Patient Left in chair;with call bell/phone within reach;with family/visitor present   Nurse Communication          Time: 2458-0998 OT Time Calculation (min): 57 min  Charges: OT General Charges $OT Visit: 1 Visit OT Treatments $Self Care/Home Management : 8-22  mins $Therapeutic Activity: 8-22 mins  Lesle Chris, OTR/L Acute Rehabilitation Services [phone] [facility] pager [phone] office 03/19/2018   [place] 03/19/2018, 1:52 PM

## 2018-03-19 NOTE — Progress Notes (Addendum)
NAME:  Mark Gallagher, MRN:  [id number], DOB:  May 23, 1970, LOS: 3 ADMISSION DATE:  03/16/2018, CONSULTATION DATE:  03/16/2018 REFERRING MD:  DR Bosie Clos, CHIEF COMPLAINT:  Post op circulatory shock   Brief History   48 year old male patient with history of acromegaly followed by endocrinology at [facility].  Status post right total hip via anterior approach on 2/25 with a EBL at 1.5 L.  Postoperatively was hypotensive requiring both crystalloid and blood replacement.  Was transfused, received hydrocortisone total of 150 mg throughout that day, and in spite of this remained hypotensive.  Because of this pulmonary was asked to evaluate.  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   reports that he has never smoked. He has never used smokeless tobacco.  Significant Hospital Events   03/16/2018 - admit for elective hip replacement 2/26: Hemoglobin stable.  Remains on Neo-Synephrine.  Stress dose steroids increased to hydrocortisone every 6 hours instead of daily  Consults:  2/25 - ccm consult for hypotension  Procedures:  2/25 - hip replacement  Significant Diagnostic Tests:  DG Pelvis 2/26 > Changes of right hip replacement. Cortical irregularity in the lateral inferior right iliac bone just superior to the acetabulum. Cannot exclude fracture. Recommend dedicated right hip images  Micro Data:    Antimicrobials:  Ancef 2/25   Interim history/subjective:  2/25 - ccm consult 2/26: Remains on Neo-Synephrine, lactic acid remains normal, CBC stable overnight.  Increasing stress dose steroids 2/27: Off NEO at 11 am  2/28: Remains off Pressors   Objective   Blood pressure 102/70, pulse 78, temperature 98.2 F (36.8 C), temperature source Oral, resp. rate 18, height 6\' 7"  (2.007 m), weight 107.2 kg, SpO2  99 %.        Intake/Output Summary (Last 24 hours) at 03/19/2018 0930 Last data filed at 03/19/2018 4665 Gross per 24 hour  Intake 297.62 ml  Output 2100 ml  Net -1802.38 ml   Filed Weights   03/17/18 9935  Weight: 107.2 kg    Examination: General: Adult male, lying in bed, no distress  HEENT notable for acromegaly. MMM  Pulmonary: Clear breath sounds, no wheeze/crackles, no use of accessory muscles   Cardiac: RRR, no MRG  Abdomen: Soft, not tender, positive bowel sounds GU: intact Neuro: Alert, oriented, follows commands  Musculoskeletal: Right hip surgical incision > Clean/Dry    Resolved Hospital Problem list     Assessment & Plan:   Persistent circulatory shock. Hypovolemia in setting of blood loss anemia vs adrenal insufficiency. -Troponins negative  -Hemoglobin stable overnight plan Cardiac Monitoring Maintain MAP >65 (Currently off NEO)  Solu-Cortef 50 T0V  Chronic Systolic Heart Failure  H/O Typical A.Flutter s/p Ablation  Plan -Given Current Hypotension will hold Metoprolol, Cozzar, eplerenone   S/P anterior right hip replacement Plan Per orthopedics Mobilize, PT/OT   Acute blood loss anemia Thrombocytopenia  Down-trending  Plan Trend CBC (Repeat 1200)  Continue to hold anticoagulation  Transfuse for hemoglobin <7  Fluid and electrolyte imbalance: Hypomagnesemia Plan Trend BMP  Replace electrolytes as indicated  D/C Foley  History of pituitary tumor with panhypopituitary  He sees endocrinology at [facility], last seen January 14 he is on multiple replacement therapy.  Of note he was on subtherapeutic adrenal replacement at baseline Plan Continue Synthroid replacement Stress dose steroids as mentioned above Follow-up endocrine at [facility]  Steroid-induced  hyperglycemia Plan Trend glucose  Acute Post-Surgical Pain Plan -PRN Norco, Morphine, Robaxin   Best practice:  Diet: Regular  DVT prophylaxis: SCD GI prophylaxis: Protonix Glucose  control: Monitor SSI Mobility: Bedrest Code Status: Full Family Communication: Parents updated at bedside  Disposition: Continue critical care services.   Hayden Pedro, AGACNP-BC [facility]  Pgr: [phone]  PCCM Pgr: [phone]

## 2018-03-19 NOTE — Progress Notes (Signed)
Physical Therapy Treatment Patient Details Name: Mark Gallagher MRN: [id number] DOB: Jul 12, 1970 Today's Date: 03/19/2018    History of Present Illness R DA-THA with postop hypotension; PMH of acromegaly, spinal fusion, L foot drop (wears AFO)    PT Comments    The patient is progressing with ambulation. Patient does require steady and safe assisance fopr bed mobility and ambulation. Patient has multiple  Multiple steps and and stairs to negotiate   At apartment(6 + 14). Patient initially reported slight dizziness which did not worsen and BP was stable  @ 124/65.sign   Follow Up Recommendations  Follow surgeon's recommendation for DC plan and follow-up therapies;Home health PT;Supervision for mobility/OOB     Equipment Recommendations  None recommended by PT    Recommendations for Other Services       Precautions / Restrictions Precautions Precautions: Fall Precaution Comments: monitor BP, L AFO at baseline--has been walking without this, HGB on low side Restrictions Other Position/Activity Restrictions: WBAT    Mobility  Bed Mobility Overal bed mobility: Needs Assistance Bed Mobility: Sit to Supine     Supine to sit: Min assist Sit to supine: Min assist;Mod assist   General bed mobility comments: provided belt around right fooot and instructed in use to self assist the leg onto bed. Did require some lifting assist the leg onto bed.  Transfers Overall transfer level: Needs assistance Equipment used: Rolling walker (2 wheeled) Transfers: Sit to/from Stand Sit to Stand: Min assist         General transfer comment: light assistance to stand/steady from lower surface of recliner.  Ambulation/Gait Ambulation/Gait assistance: Min assist Gait Distance (Feet): 160 Feet Assistive device: Rolling walker (2 wheeled) Gait Pattern/deviations: Step-to pattern;Step-through pattern Gait velocity: decr   General Gait Details: cues for sequence and RW position from self; HR  max 122, SpO2 >94% on RA.  1 stop standing rest breaks, HR drops to 102 with break.   Stairs             Wheelchair Mobility    Modified Rankin (Stroke Patients Only)       Balance           Standing balance support: Bilateral upper extremity supported;During functional activity Standing balance-Leahy Scale: Fair Standing balance comment: able to stand without use of both UE's on RW                            Cognition Arousal/Alertness: Awake/alert Behavior During Therapy: [facility] for tasks assessed/performed Overall Cognitive Status: Within Functional Limits for tasks assessed                                        Exercises Total Joint Exercises Heel Slides: AAROM;10 reps;Supine;Right Hip ABduction/ADduction: AAROM;Right;10 reps;Supine Other Exercises Other Exercises: pt is performing theraband exercises on his own    General Comments        Pertinent Vitals/Pain Pain Score: 3  Pain Location: back; R hip Pain Descriptors / Indicators: Sore;Aching;Tightness;Discomfort Pain Intervention(s): Monitored during session;Repositioned    Home Living                      Prior Function            PT Goals (current goals can now be found in the care plan section) Progress towards PT goals: Progressing toward goals  Frequency    7X/week      PT Plan Current plan remains appropriate    Co-evaluation PT/OT/SLP Co-Evaluation/Treatment: Yes Reason for Co-Treatment: Complexity of the patient's impairments (multi-system involvement);For patient/therapist safety PT goals addressed during session: Mobility/safety with mobility;Strengthening/ROM OT goals addressed during session: ADL's and self-care      AM-PAC PT "6 Clicks" Mobility   Outcome Measure  Help needed turning from your back to your side while in a flat bed without using bedrails?: A Little Help needed moving from lying on your back to sitting on the side  of a flat bed without using bedrails?: A Little Help needed moving to and from a bed to a chair (including a wheelchair)?: A Little Help needed standing up from a chair using your arms (e.g., wheelchair or bedside chair)?: A Little Help needed to walk in hospital room?: A Little Help needed climbing 3-5 steps with a railing? : Total 6 Click Score: 16    End of Session Equipment Utilized During Treatment: Gait belt Activity Tolerance: Patient tolerated treatment well Patient left: in bed;with call bell/phone within reach;with family/visitor present Nurse Communication: Mobility status PT Visit Diagnosis: Muscle weakness (generalized) (M62.81);Difficulty in walking, not elsewhere classified (R26.2);Pain Pain - Right/Left: Right Pain - part of body: Hip;Knee     Time: 1435-1510 PT Time Calculation (min) (ACUTE ONLY): 35 min  Charges:  $Gait Training: 23-37 mins                     [place] Pager [phone] Office [phone]    Claretha Cooper 03/19/2018, 3:40 PM

## 2018-03-19 NOTE — Progress Notes (Addendum)
Physical Therapy Treatment Patient Details Name: Mark Gallagher MRN: [id number] DOB: Oct 09, 1970 Today's Date: 03/19/2018    History of Present Illness R DA-THA with postop hypotension; PMH of acromegaly, spinal fusion, L foot drop (wears AFO)    PT Comments    The patient is progressing with mobility and ambulation. Requires close minimal assistance for mobility, + 2 for safety for monitoring of VS. Patient ambulated x 208 " with RW and  2 stops to stand and break with HR 127. Marland KitchenReports pain is minimal-3/10.Marland Kitchen Continue PT.  Follow Up Recommendations  Follow surgeon's recommendation for DC plan and follow-up therapies;Home health PT;Supervision for mobility/OOB     Equipment Recommendations  None recommended by PT    Recommendations for Other Services       Precautions / Restrictions Precautions Precautions: Fall Precaution Comments: monitor BP, L AFO at baseline--has been walking without this, HGB on low side Restrictions Other Position/Activity Restrictions: WBAT    Mobility  Bed Mobility Overal bed mobility: Needs Assistance Bed Mobility: Supine to Sit     Supine to sit: Min assist     General bed mobility comments: light assist for RLE  Transfers   Equipment used: Rolling walker (2 wheeled)   Sit to Stand: Min assist         General transfer comment: light assistance to stand/steady  Ambulation/Gait Ambulation/Gait assistance: Min ;+2 safety/equipment(follow chair, watch VS) Gait Distance (Feet): 208 Feet Assistive device: Rolling walker (2 wheeled) Gait Pattern/deviations: Step-to pattern;Step-through pattern Gait velocity: decr   General Gait Details: cues for sequence and RW position from self; HR max 127, SpO2 >94% on RA.  2 stop standing rest breaks   Stairs             Wheelchair Mobility    Modified Rankin (Stroke Patients Only)       Balance           Standing balance support: Bilateral upper extremity supported;During  functional activity Standing balance-Leahy Scale: Fair Standing balance comment: able to stand without use of both UE's on RW                            Cognition Arousal/Alertness: Awake/alert Behavior During Therapy: WFL for tasks assessed/performed Overall Cognitive Status: Within Functional Limits for tasks assessed                                        Exercises Other Exercises Other Exercises: pt is performing theraband exercises on his own    General Comments        Pertinent Vitals/Pain Pain Score: 3  Pain Location: B knees (chronic); R hip Pain Descriptors / Indicators: Sore;Aching Pain Intervention(s): Monitored during session;Premedicated before session    Home Living                      Prior Function            PT Goals (current goals can now be found in the care plan section) Progress towards PT goals: Progressing toward goals    Frequency    7X/week      PT Plan Current plan remains appropriate    Co-evaluation PT/OT/SLP Co-Evaluation/Treatment: Yes Reason for Co-Treatment: Complexity of the patient's impairments (multi-system involvement);For patient/therapist safety PT goals addressed during session: Mobility/safety with mobility;Strengthening/ROM OT goals addressed during session:  ADL's and self-care      AM-PAC PT "6 Clicks" Mobility   Outcome Measure  Help needed turning from your back to your side while in a flat bed without using bedrails?: A Little Help needed moving from lying on your back to sitting on the side of a flat bed without using bedrails?: A Little Help needed moving to and from a bed to a chair (including a wheelchair)?: A Little Help needed standing up from a chair using your arms (e.g., wheelchair or bedside chair)?: A Little Help needed to walk in hospital room?: A Little Help needed climbing 3-5 steps with a railing? : Total 6 Click Score: 16    End of Session Equipment  Utilized During Treatment: Gait belt Activity Tolerance: Patient tolerated treatment well Patient left: in chair;with call bell/phone within reach;with family/visitor present Nurse Communication: Mobility status PT Visit Diagnosis: Muscle weakness (generalized) (M62.81);Difficulty in walking, not elsewhere classified (R26.2);Pain Pain - Right/Left: Right Pain - part of body: Hip;Knee     Time: 7619-5093 PT Time Calculation (min) (ACUTE ONLY): 57 min  Charges:  $Gait Training: 23-37 mins                     [place] Pager [phone] Office [phone]    Claretha Cooper 03/19/2018, 3:29 PM

## 2018-03-20 ENCOUNTER — Inpatient Hospital Stay (HOSPITAL_COMMUNITY): Payer: BLUE CROSS/BLUE SHIELD

## 2018-03-20 DIAGNOSIS — R609 Edema, unspecified: Secondary | ICD-10-CM

## 2018-03-20 DIAGNOSIS — M1611 Unilateral primary osteoarthritis, right hip: Principal | ICD-10-CM

## 2018-03-20 LAB — CBC WITH DIFFERENTIAL/PLATELET
Abs Immature Granulocytes: 0.13 10*3/uL — ABNORMAL HIGH (ref 0.00–0.07)
Basophils Absolute: 0 10*3/uL (ref 0.0–0.1)
Basophils Relative: 0 %
Eosinophils Absolute: 0 10*3/uL (ref 0.0–0.5)
Eosinophils Relative: 0 %
HCT: 23.2 % — ABNORMAL LOW (ref 39.0–52.0)
Hemoglobin: 7.3 g/dL — ABNORMAL LOW (ref 13.0–17.0)
Immature Granulocytes: 2 %
Lymphocytes Relative: 14 %
Lymphs Abs: 1 10*3/uL (ref 0.7–4.0)
MCH: 31.5 pg (ref 26.0–34.0)
MCHC: 31.5 g/dL (ref 30.0–36.0)
MCV: 100 fL (ref 80.0–100.0)
Monocytes Absolute: 0.7 10*3/uL (ref 0.1–1.0)
Monocytes Relative: 9 %
Neutro Abs: 5.5 10*3/uL (ref 1.7–7.7)
Neutrophils Relative %: 75 %
Platelets: 114 10*3/uL — ABNORMAL LOW (ref 150–400)
RBC: 2.32 MIL/uL — ABNORMAL LOW (ref 4.22–5.81)
RDW: 13.9 % (ref 11.5–15.5)
WBC: 7.3 10*3/uL (ref 4.0–10.5)
nRBC: 0.4 % — ABNORMAL HIGH (ref 0.0–0.2)

## 2018-03-20 LAB — TYPE AND SCREEN
ABO/RH(D): B POS
Antibody Screen: NEGATIVE
Unit division: 0
Unit division: 0
Unit division: 0

## 2018-03-20 LAB — BASIC METABOLIC PANEL
Anion gap: 6 (ref 5–15)
BUN: 16 mg/dL (ref 6–20)
CO2: 26 mmol/L (ref 22–32)
Calcium: 8.3 mg/dL — ABNORMAL LOW (ref 8.9–10.3)
Chloride: 107 mmol/L (ref 98–111)
Creatinine, Ser: 0.54 mg/dL — ABNORMAL LOW (ref 0.61–1.24)
GFR calc Af Amer: >60 mL/min (ref >60)
GFR calc non Af Amer: >60 mL/min (ref >60)
Glucose, Bld: 126 mg/dL — ABNORMAL HIGH (ref 70–99)
Potassium: 3.5 mmol/L (ref 3.5–5.1)
Sodium: 139 mmol/L (ref 135–145)

## 2018-03-20 LAB — BPAM RBC
Blood Product Expiration Date: 202003182359
Blood Product Expiration Date: 202003182359
Blood Product Expiration Date: 202003212359
ISSUE DATE / TIME: 202002251739
ISSUE DATE / TIME: 202002251830
Unit Type and Rh: 7300
Unit Type and Rh: 7300
Unit Type and Rh: 7300

## 2018-03-20 LAB — HEMOGLOBIN AND HEMATOCRIT, BLOOD
HCT: 26 % — ABNORMAL LOW (ref 39.0–52.0)
Hemoglobin: 8.3 g/dL — ABNORMAL LOW (ref 13.0–17.0)

## 2018-03-20 LAB — GLUCOSE, CAPILLARY
Glucose-Capillary: 122 mg/dL — ABNORMAL HIGH (ref 70–99)
Glucose-Capillary: 108 mg/dL — ABNORMAL HIGH (ref 70–99)
Glucose-Capillary: 130 mg/dL — ABNORMAL HIGH (ref 70–99)
Glucose-Capillary: 123 mg/dL — ABNORMAL HIGH (ref 70–99)
Glucose-Capillary: 133 mg/dL — ABNORMAL HIGH (ref 70–99)
Glucose-Capillary: 109 mg/dL — ABNORMAL HIGH (ref 70–99)

## 2018-03-20 LAB — PREPARE RBC (CROSSMATCH)

## 2018-03-20 LAB — MAGNESIUM: Magnesium: 2.1 mg/dL (ref 1.7–2.4)

## 2018-03-20 LAB — PHOSPHORUS: Phosphorus: 2.9 mg/dL (ref 2.5–4.6)

## 2018-03-20 MED ORDER — HYDROCORTISONE NA SUCCINATE PF 100 MG IJ SOLR
50.0000 mg | Freq: Three times a day (TID) | INTRAMUSCULAR | Status: DC
  Administered 2018-03-20 – 2018-03-21 (×3): 50 mg via INTRAVENOUS
  Filled 2018-03-20 (×2): qty 2

## 2018-03-20 MED ORDER — SODIUM CHLORIDE 0.9% IV SOLUTION
Freq: Once | INTRAVENOUS | Status: AC
  Administered 2018-03-20: 13:00:00 via INTRAVENOUS

## 2018-03-20 NOTE — Progress Notes (Signed)
Left upper extremity venous duplex completed. Preliminary results in Chart review CV Proc. [place],RVS 03/20/2018, 12:14 PM

## 2018-03-20 NOTE — Progress Notes (Signed)
Bilateral lower extremity venous duplex completed. Preliminary results in Chart review CV Proc. [facility], [place] 03/20/2018, 9:18 AM

## 2018-03-20 NOTE — Progress Notes (Signed)
Orthopaedic Trauma Progress Note  S: Patient reports pain in hip as mild to moderate and improving. Tolerating diet.  No chest pain or shortness of breath.  Patient states he feel "puffy" this morning in both legs, scrotum, and left arm. Feels like it has worsened since yesterday. He is unsure how much he will be able to work with therapy today due to the swelling. yesterday was able to ambulate ~200 feet. Ultrasound tech entering room at end of exam for vascular study of both legs.    O:  Vitals:   03/20/18 0000 03/20/18 0354  BP:    Pulse:    Resp:    Temp: 98.8 F (37.1 C) 98.9 F (37.2 C)  SpO2:     General: No acute distress. Sitting up in bed.  Calm, conversant.   Respiratory: No increased work of breathing, lungs clear to auscultation anterior lung field bilaterally Cardiac: regular rate and rhythm Right lower extremity: Dressing in place, is clean, dry, intact. Minimal tenderness to palpation of hip, thigh, or knee. Decent knee ROM. Good dorsiflexion/plantarflexion Sensation and motor function intact distally. Neurovascularly intact. Feet warm and well-perfused.    Labs:  Results for orders placed or performed during the hospital encounter of 03/16/18 (from the past 24 hour(s))  Glucose, capillary     Status: Abnormal   Collection Time: 03/19/18 11:48 AM  Result Value Ref Range   Glucose-Capillary 139 (H) 70 - 99 mg/dL   Comment 1 Notify RN    Comment 2 Document in Chart   CBC     Status: Abnormal   Collection Time: 03/19/18 12:39 PM  Result Value Ref Range   WBC 7.7 4.0 - 10.5 K/uL   RBC 2.59 (L) 4.22 - 5.81 MIL/uL   Hemoglobin 8.1 (L) 13.0 - 17.0 g/dL   HCT 26.0 (L) 39.0 - 52.0 %   MCV 100.4 (H) 80.0 - 100.0 fL   MCH 31.3 26.0 - 34.0 pg   MCHC 31.2 30.0 - 36.0 g/dL   RDW 13.8 11.5 - 15.5 %   Platelets 118 (L) 150 - 400 K/uL   nRBC 0.0 0.0 - 0.2 %  Glucose, capillary     Status: Abnormal   Collection Time: 03/19/18  1:28 PM  Result Value Ref Range   Glucose-Capillary 121 (H) 70 - 99 mg/dL  Glucose, capillary     Status: Abnormal   Collection Time: 03/19/18  3:51 PM  Result Value Ref Range   Glucose-Capillary 120 (H) 70 - 99 mg/dL  Glucose, capillary     Status: Abnormal   Collection Time: 03/19/18  8:04 PM  Result Value Ref Range   Glucose-Capillary 149 (H) 70 - 99 mg/dL  Glucose, capillary     Status: Abnormal   Collection Time: 03/19/18 11:26 PM  Result Value Ref Range   Glucose-Capillary 119 (H) 70 - 99 mg/dL  Glucose, capillary     Status: Abnormal   Collection Time: 03/20/18  3:07 AM  Result Value Ref Range   Glucose-Capillary 122 (H) 70 - 99 mg/dL  Magnesium     Status: None   Collection Time: 03/20/18  4:00 AM  Result Value Ref Range   Magnesium 2.1 1.7 - 2.4 mg/dL  Phosphorus     Status: None   Collection Time: 03/20/18  4:00 AM  Result Value Ref Range   Phosphorus 2.9 2.5 - 4.6 mg/dL  Basic metabolic panel     Status: Abnormal   Collection Time: 03/20/18  4:00 AM  Result  Value Ref Range   Sodium 139 135 - 145 mmol/L   Potassium 3.5 3.5 - 5.1 mmol/L   Chloride 107 98 - 111 mmol/L   CO2 26 22 - 32 mmol/L   Glucose, Bld 126 (H) 70 - 99 mg/dL   BUN 16 6 - 20 mg/dL   Creatinine, Ser 0.54 (L) 0.61 - 1.24 mg/dL   Calcium 8.3 (L) 8.9 - 10.3 mg/dL   GFR calc non Af Amer >60 >60 mL/min   GFR calc Af Amer >60 >60 mL/min   Anion gap 6 5 - 15  Glucose, capillary     Status: Abnormal   Collection Time: 03/20/18  7:34 AM  Result Value Ref Range   Glucose-Capillary 108 (H) 70 - 99 mg/dL    Assessment/Plan:  48 year old male with history of pain and functional disability in the right hip due to arthritis who has failed non-surgical conservative treatments.   4 Days Post-Op s/p Right total hip arthroplasty by Dr. Ernesta Amble. Murphy on 03/16/2018   Primary osteoarthritis, status post total hip arthroplasty Pain Management: Continue current regimen Continue to mobilize with therapies              Weight Bearing:  Weight Bearing as Tolerated RLE             Dressings: Maintain Mepilex.               VTE prophylaxis: Aspirin, SCDs, ambulation   Postoperative Circulatory Shock Secondary to blood loss and adrenal stress Improving.  Weaned off of neo-Synephrine.  Still on Solu-Cortef 50 mg every 6 hours. Evaluation / Treatment ongoing by CCM.     ABLA: S/p 2 units PRBC.  No sign of active bleeding.  Hgb pending this AM, 8.1 yesterday.   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 [facility] ?[phone]? (phone)

## 2018-03-20 NOTE — Progress Notes (Signed)
PT Cancellation Note  Patient Details Name: Mark Gallagher MRN: [id number] DOB: Aug 30, 1970   Cancelled Treatment:    Reason Eval/Treat Not Completed: Medical issues which prohibited therapy, patient receiving 2 units of blood today. Will check back another time.   Claretha Cooper 03/20/2018, 2:58 PM  [place] Pager [phone] Office [phone]

## 2018-03-20 NOTE — Progress Notes (Signed)
PROGRESS NOTE    BIRL LOBELLO  NOB:[id number] DOB: [birth year] DOA: 03/16/2018 PCP: Wendie Agreste, MD   Brief Narrative31 year old male patient with history of acromegaly followed by endocrinology at [facility].  Status post right total hip via anterior approach on 2/25 with a EBL at 1.5 L.  Postoperatively was hypotensive requiring both crystalloid and blood replacement.  Was transfused, received hydrocortisone total of 150 mg throughout that day, and in spite of this remained hypotensive.    Patient was admitted for elective hip replacement 03/16/2018.  She received Neo-Synephrine stress dose steroids 2/26 for hypotension.  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  Blood pressure seems to have stabilized.  Continue to hold antihypertensives.  #2 status post right hip replacement through anterior approach for primary osteoarthritis  #3 acute blood loss anemia and thrombocytopenia anticoagulation on hold hemoglobin 7.3 follow-up levels tomorrow.  Hemoglobin was 9.3 on admission 7.3 today will transfuse 1 unit of packed RBC.  #4 history of chronic systolic heart failure atrial flutter ablation patient was taking metoprolol Cozaar and eplerenone  at home which is on hold due to hypotension  #5 history of panhypopituitarism with pituitary tumor follows up at [facility] endocrinology.  He has an appointment on March 24 at [facility].  Continue Synthroid stress dose steroids.  Taper stress dose steroids to every 8.  #6 increasing edema patient complains of increased  scrotal swelling and left upper extremity and bilateral lower extremity swelling.  Fianc reports all the swelling is new.  Will obtain venous Doppler of left upper extremity and bilateral lower extremities.  Estimated body mass index is 26.62 kg/m as calculated from the following:   Height as of this encounter: 6\' 7"  (2.007 m).   Weight as of this encounter: 107.2 kg.    Subjective: Patient concerned about increased swelling of bilateral lower extremity left upper extremity and scrotum  Objective: Vitals:   03/19/18 2200 03/20/18 0000 03/20/18 0354 03/20/18 0800  BP: 123/82   123/73  Pulse:    80  Resp: 19   15  Temp:  98.8 F (37.1 C) 98.9 F (37.2 C) 98.7 F (37.1 C)  TempSrc:  Oral Oral Oral  SpO2:    96%  Weight:      Height:        Intake/Output Summary (Last 24 hours) at 03/20/2018 0915 Last data filed at 03/20/2018 0354 Gross per 24 hour  Intake 736.78 ml  Output 1100 ml  Net -363.22 ml   Filed Weights   03/17/18 0605  Weight: 107.2 kg    Examination:  General exam: Appears calm and comfortable  Respiratory system: Clear to auscultation. Respiratory effort normal. Cardiovascular system: S1 & S2 heard, RRR. No JVD, murmurs, rubs, gallops or clicks. No pedal edema. Gastrointestinal system: Abdomen is nondistended, soft and nontender. No organomegaly or masses felt. Normal bowel sounds heard. Central nervous system: Alert and oriented. No focal neurological deficits. Extremities: 1+ bilateral pitting edema and 2+ left upper extremity edema left upper extremity significantly swollen compared to right  upper extremity.  Scrotal edema noted Skin: No rashes, lesions or ulcers Psychiatry: Judgement and insight appear normal. Mood & affect appropriate.     Data Reviewed: I have personally reviewed following labs and imaging studies  CBC: Recent Labs  Lab 03/17/18 0520 03/17/18 1250 03/18/18 0305 03/18/18 1541 03/19/18 0313 03/19/18 1239 03/20/18 0400  WBC  9.3 7.2 7.8 6.6 6.7 7.7 7.3  NEUTROABS 6.8 5.5 6.2  --  5.1  --  5.5  HGB 9.7* 9.3* 8.1* 8.1* 7.4* 8.1* 7.3*  HCT 28.8* 29.2* 25.3* 24.8* 23.4* 26.0* 23.2*  MCV 95.7 98.3 99.6 99.6 99.2 100.4* 100.0  PLT 130* 127* 113* 102* 98* 118* 664*   Basic Metabolic Panel: Recent Labs  Lab 03/16/18 2045 03/17/18 0059 03/17/18 0520 03/18/18 0305 03/19/18 0313 03/20/18 0400  NA 139 137  --  138 139 139  K 3.5 3.6  --  3.9 3.6 3.5  CL 107 107  --  108 107 107  CO2 23 23  --  27 27 26   GLUCOSE 140* 169*  --  137* 135* 126*  BUN 21* 24*  --  17 15 16   CREATININE 0.56* 0.72  --  0.68 0.60* 0.54*  CALCIUM 8.4* 8.2*  --  8.0* 8.1* 8.3*  MG  --   --  1.6* 2.2 1.9 2.1  PHOS  --   --  2.9 2.4* 2.1* 2.9   GFR: Estimated Creatinine Clearance: 149.7 mL/min (A) (by C-G formula based on SCr of 0.54 mg/dL (L)). Liver Function Tests: Recent Labs  Lab 03/16/18 2045 03/17/18 0520  AST 13* 17  ALT 17 16  ALKPHOS 40 35*  BILITOT 3.0* 1.9*  PROT 5.7* 5.0*  ALBUMIN 3.9 3.2*   No results for input(s): LIPASE, AMYLASE in the last 168 hours. No results for input(s): AMMONIA in the last 168 hours. Coagulation Profile: Recent Labs  Lab 03/16/18 2045 03/17/18 0520  INR 1.2 1.3*   Cardiac Enzymes: Recent Labs  Lab 03/16/18 2045 03/17/18 0520  TROPONINI <0.03 <0.03   BNP (last 3 results) No results for input(s): PROBNP in the last 8760 hours. HbA1C: No results for input(s): HGBA1C in the last 72 hours. CBG: Recent Labs  Lab 03/19/18 1551 03/19/18 2004 03/19/18 2326 03/20/18 0307 03/20/18 0734  GLUCAP 120* 149* 119* 122* 108*   Lipid Profile: No results for input(s): CHOL, HDL, LDLCALC, TRIG, CHOLHDL, LDLDIRECT in the last 72 hours. Thyroid Function Tests: No results for input(s): TSH, T4TOTAL, FREET4, T3FREE, THYROIDAB in the last 72 hours. Anemia Panel: No results for input(s): VITAMINB12, FOLATE, FERRITIN, TIBC, IRON, RETICCTPCT in the last 72 hours. Sepsis Labs: Recent Labs    Lab 03/16/18 2045 03/17/18 0555  LATICACIDVEN 1.0 1.5    No results found for this or any previous visit (from the past 240 hour(s)).       Radiology Studies: No results found.      Scheduled Meds: . aspirin  81 mg Oral BID  . atorvastatin  40 mg Oral q1800  . Chlorhexidine Gluconate Cloth  6 each Topical Daily  . docusate sodium  100 mg Oral BID  . ferrous sulfate  325 mg Oral BID WC  . gabapentin  300 mg Oral TID  . hydrocortisone sod succinate (SOLU-CORTEF) inj  50 mg Intravenous Q6H  . insulin aspart  1-3 Units Subcutaneous Q4H  . levothyroxine  200 mcg Oral Daily  . mouth rinse  15 mL Mouth Rinse BID   Continuous Infusions: . methocarbamol (ROBAXIN) IV  LOS: 4 days     Georgette Shell, MD [facility]  If 7PM-7AM, please contact night-coverage [email or web address] Password [email or web address] 03/20/2018, 9:15 AM

## 2018-03-21 LAB — COMPREHENSIVE METABOLIC PANEL
ALT: 19 U/L (ref 0–44)
AST: 15 U/L (ref 15–41)
Albumin: 3.2 g/dL — ABNORMAL LOW (ref 3.5–5.0)
Alkaline Phosphatase: 46 U/L (ref 38–126)
Anion gap: 7 (ref 5–15)
BUN: 20 mg/dL (ref 6–20)
CO2: 27 mmol/L (ref 22–32)
Calcium: 8.3 mg/dL — ABNORMAL LOW (ref 8.9–10.3)
Chloride: 104 mmol/L (ref 98–111)
Creatinine, Ser: 0.57 mg/dL — ABNORMAL LOW (ref 0.61–1.24)
GFR calc Af Amer: >60 mL/min (ref >60)
GFR calc non Af Amer: >60 mL/min (ref >60)
Glucose, Bld: 129 mg/dL — ABNORMAL HIGH (ref 70–99)
Potassium: 3.6 mmol/L (ref 3.5–5.1)
Sodium: 138 mmol/L (ref 135–145)
Total Bilirubin: 1.5 mg/dL — ABNORMAL HIGH (ref 0.3–1.2)
Total Protein: 5.5 g/dL — ABNORMAL LOW (ref 6.5–8.1)

## 2018-03-21 LAB — GLUCOSE, CAPILLARY
Glucose-Capillary: 124 mg/dL — ABNORMAL HIGH (ref 70–99)
Glucose-Capillary: 116 mg/dL — ABNORMAL HIGH (ref 70–99)
Glucose-Capillary: 114 mg/dL — ABNORMAL HIGH (ref 70–99)
Glucose-Capillary: 96 mg/dL (ref 70–99)
Glucose-Capillary: 149 mg/dL — ABNORMAL HIGH (ref 70–99)

## 2018-03-21 LAB — CBC
HCT: 25.6 % — ABNORMAL LOW (ref 39.0–52.0)
Hemoglobin: 8 g/dL — ABNORMAL LOW (ref 13.0–17.0)
MCH: 31.1 pg (ref 26.0–34.0)
MCHC: 31.3 g/dL (ref 30.0–36.0)
MCV: 99.6 fL (ref 80.0–100.0)
Platelets: 139 10*3/uL — ABNORMAL LOW (ref 150–400)
RBC: 2.57 MIL/uL — ABNORMAL LOW (ref 4.22–5.81)
RDW: 15.2 % (ref 11.5–15.5)
WBC: 6.8 10*3/uL (ref 4.0–10.5)
nRBC: 1 % — ABNORMAL HIGH (ref 0.0–0.2)

## 2018-03-21 MED ORDER — POLYETHYLENE GLYCOL 3350 17 G PO PACK
17.0000 g | PACK | Freq: Every day | ORAL | Status: DC
  Administered 2018-03-21 – 2018-03-22 (×2): 17 g via ORAL
  Filled 2018-03-21 (×3): qty 1

## 2018-03-21 MED ORDER — BISACODYL 5 MG PO TBEC
10.0000 mg | DELAYED_RELEASE_TABLET | Freq: Every day | ORAL | Status: DC
  Administered 2018-03-21 – 2018-03-22 (×2): 10 mg via ORAL
  Filled 2018-03-21 (×3): qty 2

## 2018-03-21 MED ORDER — HYDROCORTISONE NA SUCCINATE PF 100 MG IJ SOLR
50.0000 mg | Freq: Two times a day (BID) | INTRAMUSCULAR | Status: DC
  Administered 2018-03-21 – 2018-03-22 (×2): 50 mg via INTRAVENOUS
  Filled 2018-03-21 (×2): qty 2

## 2018-03-21 NOTE — Progress Notes (Signed)
Orthopaedic Trauma Progress Note  S: Patient reports pain in hip as mild to moderate and improving. Tolerating diet.  No chest pain or shortness of breath.  Continues to note swelling in extremities but does feel it has improved a bit from yesterday. Vascular doppler of bilateral lower extremities and left upper extremity done yesterday were negative. Received 1 unit PRBCs yesterday. Continues to work well with therapy, motivated to get back home.   O:  Vitals:   03/21/18 0907 03/21/18 0946  BP:  129/67  Pulse:  66  Resp:    Temp: 97.8 F (36.6 C) 98.2 F (36.8 C)  SpO2:  97%   General: No acute distress. Sitting up in bed.  Calm, conversant.   Respiratory: No increased work of breathing, lungs clear to auscultation anterior lung field bilaterally Cardiac: regular rate and rhythm Right lower extremity: Dressing in place, is clean, dry, intact. +1 edema. Minimal tenderness to palpation of hip, thigh, or knee. Decent knee ROM. Good dorsiflexion/plantarflexion. Sensation and motor function intact distally. Neurovascularly intact. Feet warm and well-perfused.    Labs:  Results for orders placed or performed during the hospital encounter of 03/16/18 (from the past 24 hour(s))  Type and screen [place]     Status: None (Preliminary result)   Collection Time: 03/20/18 10:13 AM  Result Value Ref Range   ABO/RH(D) B POS    Antibody Screen NEG    Sample Expiration 03/23/2018    Unit Number [id number]    Blood Component Type RED CELLS,LR    Unit division 00    Status of Unit ISSUED    Transfusion Status OK TO TRANSFUSE    Crossmatch Result      Compatible Performed at [facility], [place] [address] [place], [place] [address]   Prepare RBC     Status: None   Collection Time: 03/20/18 10:13 AM  Result Value Ref Range   Order Confirmation      ORDER PROCESSED BY BLOOD BANK Performed at [facility] [address]  [place],  [address]   Glucose, capillary     Status: Abnormal   Collection Time: 03/20/18 11:58 AM  Result Value Ref Range   Glucose-Capillary 130 (H) 70 - 99 mg/dL  Glucose, capillary     Status: Abnormal   Collection Time: 03/20/18  3:45 PM  Result Value Ref Range   Glucose-Capillary 123 (H) 70 - 99 mg/dL  Hemoglobin and hematocrit, blood     Status: Abnormal   Collection Time: 03/20/18  6:52 PM  Result Value Ref Range   Hemoglobin 8.3 (L) 13.0 - 17.0 g/dL   HCT 26.0 (L) 39.0 - 52.0 %  Glucose, capillary     Status: Abnormal   Collection Time: 03/20/18  8:27 PM  Result Value Ref Range   Glucose-Capillary 133 (H) 70 - 99 mg/dL  Glucose, capillary     Status: Abnormal   Collection Time: 03/20/18 11:28 PM  Result Value Ref Range   Glucose-Capillary 109 (H) 70 - 99 mg/dL  Glucose, capillary     Status: Abnormal   Collection Time: 03/21/18  3:32 AM  Result Value Ref Range   Glucose-Capillary 124 (H) 70 - 99 mg/dL  CBC     Status: Abnormal   Collection Time: 03/21/18  3:49 AM  Result Value Ref Range   WBC 6.8 4.0 - 10.5 K/uL   RBC 2.57 (L) 4.22 - 5.81 MIL/uL   Hemoglobin 8.0 (L) 13.0 -  17.0 g/dL   HCT 25.6 (L) 39.0 - 52.0 %   MCV 99.6 80.0 - 100.0 fL   MCH 31.1 26.0 - 34.0 pg   MCHC 31.3 30.0 - 36.0 g/dL   RDW 15.2 11.5 - 15.5 %   Platelets 139 (L) 150 - 400 K/uL   nRBC 1.0 (H) 0.0 - 0.2 %  Comprehensive metabolic panel     Status: Abnormal   Collection Time: 03/21/18  3:49 AM  Result Value Ref Range   Sodium 138 135 - 145 mmol/L   Potassium 3.6 3.5 - 5.1 mmol/L   Chloride 104 98 - 111 mmol/L   CO2 27 22 - 32 mmol/L   Glucose, Bld 129 (H) 70 - 99 mg/dL   BUN 20 6 - 20 mg/dL   Creatinine, Ser 0.57 (L) 0.61 - 1.24 mg/dL   Calcium 8.3 (L) 8.9 - 10.3 mg/dL   Total Protein 5.5 (L) 6.5 - 8.1 g/dL   Albumin 3.2 (L) 3.5 - 5.0 g/dL   AST 15 15 - 41 U/L   ALT 19 0 - 44 U/L   Alkaline Phosphatase 46 38 - 126 U/L   Total Bilirubin 1.5 (H) 0.3 - 1.2 mg/dL   GFR calc non Af  Amer >60 >60 mL/min   GFR calc Af Amer >60 >60 mL/min   Anion gap 7 5 - 15  Glucose, capillary     Status: Abnormal   Collection Time: 03/21/18  7:36 AM  Result Value Ref Range   Glucose-Capillary 116 (H) 70 - 99 mg/dL   Comment 1 Notify RN    Comment 2 Document in Chart     Assessment/Plan:  48 year old male with history of pain and functional disability in the right hip due to arthritis who has failed non-surgical conservative treatments.   5 Days Post-Op s/p Right total hip arthroplasty by Dr. Ernesta Amble. Murphy on 03/16/2018   Primary osteoarthritis, status post total hip arthroplasty Pain Management: Continue current regimen Continue to mobilize with therapies              Weight Bearing: Weight Bearing as Tolerated RLE             Dressings: Maintain Mepilex.               VTE prophylaxis: Aspirin, SCDs, ambulation   Postoperative Circulatory Shock Secondary to blood loss and adrenal stress Improving.  Weaned off of neo-Synephrine.  Still on Solu-Cortef 50 mg every 6 hours. Evaluation / Treatment ongoing by CCM.     ABLA: S/p 3 units PRBC, last unit given 03/20/18.  No sign of active bleeding.  Hgb 8.0 this AM, was 7.3 yesterday prior to transfusion.   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 [facility] ?[phone]? (phone)

## 2018-03-21 NOTE — Progress Notes (Signed)
PT Cancellation Note  Patient Details Name: Mark Gallagher MRN: [id number] DOB: 1970-07-11   Cancelled Treatment:     PT deferred this pm at pt request 2* fatigue.  Will follow.   [name] 03/21/2018, 3:17 PM

## 2018-03-21 NOTE — Progress Notes (Signed)
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 Status post right total hip via anterior approach on 2/25 with a EBL at 1.5 L. Postoperatively was hypotensive requiring both crystalloid and blood replacement. Was transfused, received hydrocortisone total of 150 mg throughout that day, and in spite of this remained hypotensive.   Patient was admitted for elective hip replacement 03/16/2018.  She received Neo-Synephrine stress dose steroids 2/26 for hypotension.  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  Blood pressure seems to have stabilized.  Continue to hold antihypertensives.  #2 status post right hip replacement through anterior approach for primary osteoarthritis  #3 acute blood loss anemia and thrombocytopenia anticoagulation on hold hemoglobin 8after one unit prbc.   #4 history of chronic systolic heart failure atrial flutter ablation patient was taking metoprolol Cozaar and eplerenone  at home which is on hold due to hypotension  #5 history of panhypopituitarism with pituitary tumor follows up at [facility] endocrinology.  He has an appointment on March 24 at [facility].  Continue Synthroid stress dose steroids.  Taper stress dose steroids to every 12  #6 increasing edema patient complains of increased scrotal swelling and left upper extremity and bilateral lower extremity swelling.  Dopplers negative.i will hold off on diuretics  #7 constipation stool softners Estimated body mass index is 28.83 kg/m as calculated from the following:   Height as of this encounter: 6\' 7"  (2.007 m).   Weight as of this encounter: 116.1 kg.     Subjective: Weight gain of 20 lbs since admit no sob doe vascular doppler b/l le and lue neagtive for dvt.c/o constipation  Objective: Vitals:   03/21/18 0000 03/21/18 0400 03/21/18 0420 03/21/18 0427  BP: (!) 90/58 115/72    Pulse: 69 66    Resp: 10 13    Temp: 97.8 F (36.6 C)  98.2 F (36.8 C)   TempSrc: Oral  Oral   SpO2: 93% 94%    Weight:    116.1 kg  Height:        Intake/Output Summary (Last 24 hours) at 03/21/2018 0815 Last data filed at 03/21/2018 0324 Gross per 24 hour  Intake 10 ml  Output 850 ml  Net -840 ml   Filed Weights   03/17/18 0605 03/21/18 0427  Weight: 107.2 kg 116.1 kg    Examination:  General exam: Appears calm and comfortable  Respiratory system: Clear to auscultation. Respiratory effort normal. Cardiovascular system: S1 & S2 heard, RRR. No JVD, murmurs, rubs, gallops or clicks. No pedal edema. Gastrointestinal system: Abdomen is nondistended, soft and nontender. No organomegaly or masses felt. Normal bowel sounds heard. Central nervous system: Alert and oriented. No focal neurological deficits. Extremities: LUE edema B/L LE edema one plus. Skin: No rashes, lesions or ulcers Psychiatry: Judgement and insight appear normal. Mood & affect appropriate.     Data Reviewed: I have personally reviewed following labs and imaging studies  CBC: Recent Labs  Lab 03/17/18 0520 03/17/18 1250 03/18/18 0305 03/18/18 1541 03/19/18 0313 03/19/18 1239 03/20/18 0400 03/20/18 1852 03/21/18 0349  WBC 9.3 7.2 7.8 6.6 6.7 7.7 7.3  --  6.8  NEUTROABS 6.8 5.5 6.2  --  5.1  --  5.5  --   --   HGB 9.7* 9.3* 8.1* 8.1* 7.4* 8.1* 7.3* 8.3* 8.0*  HCT 28.8* 29.2* 25.3* 24.8* 23.4* 26.0* 23.2* 26.0* 25.6*  MCV 95.7  98.3 99.6 99.6 99.2 100.4* 100.0  --  99.6  PLT 130* 127* 113* 102* 98* 118* 114*  --  616*   Basic Metabolic Panel: Recent Labs  Lab 03/17/18 0059 03/17/18 0520 03/18/18 0305 03/19/18 0313 03/20/18 0400 03/21/18 0349  NA 137  --  138 139 139 138  K 3.6  --  3.9 3.6 3.5 3.6  CL 107  --  108 107 107 104  CO2 23  --  27 27 26 27   GLUCOSE 169*  --  137* 135* 126* 129*  BUN 24*  --  17 15 16 20   CREATININE 0.72  --  0.68 0.60* 0.54* 0.57*  CALCIUM 8.2*  --  8.0* 8.1* 8.3* 8.3*  MG  --  1.6* 2.2 1.9 2.1  --   PHOS  --  2.9 2.4* 2.1* 2.9  --    GFR: Estimated Creatinine Clearance: 164 mL/min (A) (by C-G formula based on SCr of 0.57 mg/dL (L)). Liver Function Tests: Recent Labs  Lab 03/16/18 2045 03/17/18 0520 03/21/18 0349  AST 13* 17 15  ALT 17 16 19   ALKPHOS 40 35* 46  BILITOT 3.0* 1.9* 1.5*  PROT 5.7* 5.0* 5.5*  ALBUMIN 3.9 3.2* 3.2*   No results for input(s): LIPASE, AMYLASE in the last 168 hours. No results for input(s): AMMONIA in the last 168 hours. Coagulation Profile: Recent Labs  Lab 03/16/18 2045 03/17/18 0520  INR 1.2 1.3*   Cardiac Enzymes: Recent Labs  Lab 03/16/18 2045 03/17/18 0520  TROPONINI <0.03 <0.03   BNP (last 3 results) No results for input(s): PROBNP in the last 8760 hours. HbA1C: No results for input(s): HGBA1C in the last 72 hours. CBG: Recent Labs  Lab 03/20/18 1545 03/20/18 2027 03/20/18 2328 03/21/18 0332 03/21/18 0736  GLUCAP 123* 133* 109* 124* 116*   Lipid Profile: No results for input(s): CHOL, HDL, LDLCALC, TRIG, CHOLHDL, LDLDIRECT in the last 72 hours. Thyroid Function Tests: No results for input(s): TSH, T4TOTAL, FREET4, T3FREE, THYROIDAB in the last 72 hours. Anemia Panel: No results for input(s): VITAMINB12, FOLATE, FERRITIN, TIBC, IRON, RETICCTPCT in the last 72 hours. Sepsis Labs: Recent Labs  Lab 03/16/18 2045 03/17/18 0555  LATICACIDVEN 1.0 1.5    No results found for this or any previous visit (from  the past 240 hour(s)).       Radiology Studies: Vas [place] Lower Extremity Venous (dvt)  Result Date: 03/20/2018  Lower Venous Study Indications: Pain, and Swelling.  Risk Factors: Surgery Right Hip. Performing Technologist: Toma Copier, D  Examination Guidelines: A complete evaluation includes B-mode imaging, spectral Doppler, color Doppler, and power Doppler as needed of all accessible portions of each vessel. Bilateral testing is considered an integral part of a complete examination. Limited examinations for reoccurring indications may be performed as noted.  Right Venous Findings: +---------+---------------+---------+-----------+----------+------------------+          CompressibilityPhasicitySpontaneityPropertiesSummary            +---------+---------------+---------+-----------+----------+------------------+ CFV      Full  Yes      Yes                                     +---------+---------------+---------+-----------+----------+------------------+ SFJ      Full                                                            +---------+---------------+---------+-----------+----------+------------------+ FV Prox  Full           Yes      Yes                                     +---------+---------------+---------+-----------+----------+------------------+ FV Mid   Full                                                            +---------+---------------+---------+-----------+----------+------------------+ FV DistalFull           Yes      Yes                                     +---------+---------------+---------+-----------+----------+------------------+ PFV      Full           Yes      Yes                                     +---------+---------------+---------+-----------+----------+------------------+ POP      Full           Yes      Yes                                      +---------+---------------+---------+-----------+----------+------------------+ PTV      Full                                                            +---------+---------------+---------+-----------+----------+------------------+ PERO     Full                                         Difficult to image +---------+---------------+---------+-----------+----------+------------------+  Left Venous Findings: +---------+---------------+---------+-----------+----------+-------------------+          CompressibilityPhasicitySpontaneityPropertiesSummary             +---------+---------------+---------+-----------+----------+-------------------+ CFV      Full           Yes      Yes                                      +---------+---------------+---------+-----------+----------+-------------------+  SFJ      Full                                                             +---------+---------------+---------+-----------+----------+-------------------+ FV Prox  Full           Yes      Yes                                      +---------+---------------+---------+-----------+----------+-------------------+ FV Mid   Full                                                             +---------+---------------+---------+-----------+----------+-------------------+ FV DistalFull           Yes      Yes                                      +---------+---------------+---------+-----------+----------+-------------------+ PFV      Full           Yes      Yes                                      +---------+---------------+---------+-----------+----------+-------------------+ POP      Full           Yes      Yes                                      +---------+---------------+---------+-----------+----------+-------------------+ PTV      Full                                                              +---------+---------------+---------+-----------+----------+-------------------+ PERO                                                  Unable to image                                                           well enough to  evaluate            +---------+---------------+---------+-----------+----------+-------------------+  Left Technical Findings: Not visualized segments include peroneal. Unable to image well enough to fully evaluate.   Summary: Right: There is no evidence of deep vein thrombosis in the lower extremity. No cystic structure found in the popliteal fossa. Left: There is no evidence of deep vein thrombosis in the veins able to be fully evaluated in the lower extremity. No cystic structure found in the popliteal fossa. See technical findings listed above.  *See table(s) above for measurements and observations. Electronically signed by Servando Snare MD on 03/20/2018 at 1:24:05 PM.    Final    Vas [place] Upper Extremity Venous Duplex  Result Date: 03/20/2018 UPPER VENOUS STUDY  Indications: Swelling Performing Technologist: Toma Copier RVS  Examination Guidelines: A complete evaluation includes B-mode imaging, spectral Doppler, color Doppler, and power Doppler as needed of all accessible portions of each vessel. Bilateral testing is considered an integral part of a complete examination. Limited examinations for reoccurring indications may be performed as noted.  Left Findings: +--------+------------+----------+---------+-----------+-------+ LEFT    CompressiblePropertiesPhasicitySpontaneousSummary +--------+------------+----------+---------+-----------+-------+ IJV         Full                 Yes       Yes            +--------+------------+----------+---------+-----------+-------+ Axillary    Full                 Yes       Yes             +--------+------------+----------+---------+-----------+-------+ Brachial    Full                 Yes       Yes            +--------+------------+----------+---------+-----------+-------+ Radial      Full                                          +--------+------------+----------+---------+-----------+-------+ Ulnar       Full                                          +--------+------------+----------+---------+-----------+-------+ Cephalic    Full                                          +--------+------------+----------+---------+-----------+-------+ Basilic     Full                                          +--------+------------+----------+---------+-----------+-------+  Summary:  Left: No evidence of deep vein thrombosis in the upper extremity. No evidence of superficial vein thrombosis in the upper extremity. No evidence of thrombosis in the subclavian.  *See table(s) above for measurements and observations.  Diagnosing physician: Servando Snare MD Electronically signed by Servando Snare MD on 03/20/2018 at 1:23:41 PM.    Final         Scheduled Meds: . aspirin  81 mg Oral BID  . atorvastatin  40 mg Oral q1800  .  bisacodyl  10 mg Oral Daily  . Chlorhexidine Gluconate Cloth  6 each Topical Daily  . docusate sodium  100 mg Oral BID  . ferrous sulfate  325 mg Oral BID WC  . gabapentin  300 mg Oral TID  . hydrocortisone sod succinate (SOLU-CORTEF) inj  50 mg Intravenous Q12H  . insulin aspart  1-3 Units Subcutaneous Q4H  . levothyroxine  200 mcg Oral Daily  . mouth rinse  15 mL Mouth Rinse BID  . polyethylene glycol  17 g Oral Daily   Continuous Infusions: . methocarbamol (ROBAXIN) IV       LOS: 5 days    Georgette Shell, MD [facility]   If 7PM-7AM, please contact night-coverage [email or web address] Password [email or web address] 03/21/2018, 8:15 AM

## 2018-03-21 NOTE — Progress Notes (Signed)
Physical Therapy Treatment Patient Details Name: Mark Gallagher MRN: [id number] DOB: 11-Oct-1970 Today's Date: 03/21/2018    History of Present Illness R DA-THA with postop hypotension; PMH of acromegaly, spinal fusion, L foot drop (wears AFO)    PT Comments    Pt pleased with increased activity tolerance this am.  No c/o dizziness, HR elevated to 126 with gait.   Follow Up Recommendations  Follow surgeon's recommendation for DC plan and follow-up therapies;Home health PT;Supervision for mobility/OOB     Equipment Recommendations  None recommended by PT    Recommendations for Other Services       Precautions / Restrictions Precautions Precautions: Fall Precaution Comments: monitor BP, L AFO at baseline--has been walking without this, HGB on low side Restrictions Weight Bearing Restrictions: No Other Position/Activity Restrictions: WBAT    Mobility  Bed Mobility Overal bed mobility: Needs Assistance Bed Mobility: Supine to Sit;Sit to Supine     Supine to sit: Min assist Sit to supine: Min assist;Mod assist   General bed mobility comments: cues for sequence and assist to bring R LE up onto bed  Transfers Overall transfer level: Needs assistance Equipment used: Rolling walker (2 wheeled) Transfers: Sit to/from Stand Sit to Stand: Min guard;From elevated surface         General transfer comment: light assistance to stand/steady   Ambulation/Gait Ambulation/Gait assistance: Min guard;+2 safety/equipment Gait Distance (Feet): 410 Feet Assistive device: Rolling walker (2 wheeled) Gait Pattern/deviations: Step-to pattern;Step-through pattern Gait velocity: decr   General Gait Details: cues for sequence and RW position from self; HR max 126, SpO2 >94% on RA.  3 stop standing rest breaks,   Stairs             Wheelchair Mobility    Modified Rankin (Stroke Patients Only)       Balance Overall balance assessment: Needs assistance Sitting-balance  support: Feet supported Sitting balance-Leahy Scale: Good     Standing balance support: Bilateral upper extremity supported;During functional activity Standing balance-Leahy Scale: Fair                              Cognition Arousal/Alertness: Awake/alert Behavior During Therapy: WFL for tasks assessed/performed Overall Cognitive Status: Within Functional Limits for tasks assessed                                        Exercises      General Comments        Pertinent Vitals/Pain Pain Assessment: 0-10 Pain Score: 4  Pain Location: back; R hip Pain Descriptors / Indicators: Sore;Aching;Tightness;Discomfort Pain Intervention(s): Limited activity within patient's tolerance;Monitored during session    Home Living                      Prior Function            PT Goals (current goals can now be found in the care plan section) Acute Rehab PT Goals Patient Stated Goal: go to church, visit friends PT Goal Formulation: With patient/family Time For Goal Achievement: 03/24/18 Potential to Achieve Goals: Good Progress towards PT goals: Progressing toward goals    Frequency    7X/week      PT Plan Current plan remains appropriate    Co-evaluation              AM-PAC PT "6  Clicks" Mobility   Outcome Measure  Help needed turning from your back to your side while in a flat bed without using bedrails?: A Little Help needed moving from lying on your back to sitting on the side of a flat bed without using bedrails?: A Little Help needed moving to and from a bed to a chair (including a wheelchair)?: A Little Help needed standing up from a chair using your arms (e.g., wheelchair or bedside chair)?: A Little Help needed to walk in hospital room?: A Little Help needed climbing 3-5 steps with a railing? : A Lot 6 Click Score: 17    End of Session Equipment Utilized During Treatment: Gait belt Activity Tolerance: Patient tolerated  treatment well Patient left: in bed;with call bell/phone within reach Nurse Communication: Mobility status PT Visit Diagnosis: Muscle weakness (generalized) (M62.81);Difficulty in walking, not elsewhere classified (R26.2);Pain Pain - Right/Left: Right Pain - part of body: Hip     Time: 1110-1140 PT Time Calculation (min) (ACUTE ONLY): 30 min  Charges:  $Gait Training: 23-37 mins                     [place] Pager [phone] Office [phone]    Annel Zunker 03/21/2018, 1:09 PM

## 2018-03-22 ENCOUNTER — Encounter (HOSPITAL_COMMUNITY): Payer: Self-pay

## 2018-03-22 LAB — CBC
HCT: 27.2 % — ABNORMAL LOW (ref 39.0–52.0)
Hemoglobin: 8.1 g/dL — ABNORMAL LOW (ref 13.0–17.0)
MCH: 30.6 pg (ref 26.0–34.0)
MCHC: 29.8 g/dL — ABNORMAL LOW (ref 30.0–36.0)
MCV: 102.6 fL — ABNORMAL HIGH (ref 80.0–100.0)
Platelets: 154 10*3/uL (ref 150–400)
RBC: 2.65 MIL/uL — ABNORMAL LOW (ref 4.22–5.81)
RDW: 15.1 % (ref 11.5–15.5)
WBC: 7.9 10*3/uL (ref 4.0–10.5)
nRBC: 1.3 % — ABNORMAL HIGH (ref 0.0–0.2)

## 2018-03-22 LAB — BASIC METABOLIC PANEL
Anion gap: 5 (ref 5–15)
BUN: 21 mg/dL — ABNORMAL HIGH (ref 6–20)
CO2: 28 mmol/L (ref 22–32)
Calcium: 8.1 mg/dL — ABNORMAL LOW (ref 8.9–10.3)
Chloride: 105 mmol/L (ref 98–111)
Creatinine, Ser: 0.65 mg/dL (ref 0.61–1.24)
GFR calc Af Amer: >60 mL/min (ref >60)
GFR calc non Af Amer: >60 mL/min (ref >60)
Glucose, Bld: 115 mg/dL — ABNORMAL HIGH (ref 70–99)
Potassium: 3.7 mmol/L (ref 3.5–5.1)
Sodium: 138 mmol/L (ref 135–145)

## 2018-03-22 LAB — TYPE AND SCREEN
ABO/RH(D): B POS
Antibody Screen: NEGATIVE
Unit division: 0

## 2018-03-22 LAB — BPAM RBC
Blood Product Expiration Date: 202003122359
ISSUE DATE / TIME: 202002291229
Unit Type and Rh: 7300

## 2018-03-22 LAB — GLUCOSE, CAPILLARY
Glucose-Capillary: 100 mg/dL — ABNORMAL HIGH (ref 70–99)
Glucose-Capillary: 103 mg/dL — ABNORMAL HIGH (ref 70–99)
Glucose-Capillary: 107 mg/dL — ABNORMAL HIGH (ref 70–99)
Glucose-Capillary: 109 mg/dL — ABNORMAL HIGH (ref 70–99)
Glucose-Capillary: 111 mg/dL — ABNORMAL HIGH (ref 70–99)

## 2018-03-22 MED ORDER — SORBITOL 70 % SOLN
960.0000 mL | TOPICAL_OIL | Freq: Once | ORAL | Status: AC | PRN
  Administered 2018-03-22: 960 mL via RECTAL
  Filled 2018-03-22 (×2): qty 473

## 2018-03-22 MED ORDER — FUROSEMIDE 10 MG/ML IJ SOLN
20.0000 mg | Freq: Once | INTRAMUSCULAR | Status: AC
  Administered 2018-03-22: 20 mg via INTRAVENOUS
  Filled 2018-03-22: qty 2

## 2018-03-22 MED ORDER — DOCUSATE SODIUM 100 MG PO CAPS
200.0000 mg | ORAL_CAPSULE | Freq: Two times a day (BID) | ORAL | Status: DC
  Administered 2018-03-22 (×2): 200 mg via ORAL
  Filled 2018-03-22 (×3): qty 2

## 2018-03-22 MED ORDER — HYDROCORTISONE NA SUCCINATE PF 100 MG IJ SOLR
50.0000 mg | Freq: Every day | INTRAMUSCULAR | Status: DC
  Filled 2018-03-22: qty 2

## 2018-03-22 MED ORDER — BISACODYL 10 MG RE SUPP
10.0000 mg | Freq: Once | RECTAL | Status: AC
  Administered 2018-03-22: 10 mg via RECTAL
  Filled 2018-03-22: qty 1

## 2018-03-22 NOTE — Progress Notes (Signed)
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 Status post right total hip via anterior approach on 2/25 with a EBL at 1.5 L. Postoperatively was hypotensive requiring both crystalloid and blood replacement. Was transfused, received hydrocortisone total of 150 mg throughout that day, and in spite of this remained hypotensive.  Patient was admitted for elective hip replacement 03/16/2018. She received Neo-Synephrine stress dose steroids 2/26 for hypotension.  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 Blood pressure seems to have stabilized. Continue to hold antihypertensives.  #2 status post right hip replacement through anterior approach for primary osteoarthritis  #3 acute blood loss anemia and thrombocytopenia anticoagulation on hold hemoglobin 8after one unit prbc.   #4 history of chronic systolic heart failure atrial flutter ablation patient was taking metoprolol Cozaar andeplerenoneat home which is on hold due to hypotension.  #5 history of panhypopituitarism with pituitary tumor follows up at [facility] endocrinology. He has an appointment on March 24 at [facility]. Continue Synthroid stress dose steroids. Taper stress dose steroids to daily.  I have placed a call to [facility] endocrine.  #6 increasing edema patient complains of increased scrotal swelling and left upper extremity and  bilateral lower extremity swelling. Dopplers negative.patient still overly concerned about his swelling since her blood pressure has come up I will give him a dose of Lasix and observe.    #7 constipation have started the patient on Dulcolax and MiraLAX Dulcolax suppository and smog enema if needed.  Estimated body mass index is 30.32 kg/m as calculated from the following:   Height as of this encounter: 6\' 7"  (2.007 m).   Weight as of this encounter: 122.1 kg.  DVT prophylaxis: Aspirin SCD Code Status: Full code Family Communication: Mother present in the room Disposition Plan: Per Ortho  subjective has return of complaints including constipation and increasing weight he has gained 25 pounds since hospital admission and is very concerned about it but has no shortness of breath dyspnea on exertion he is ambulating in the hallway  Objective: Vitals:   03/21/18 1506 03/21/18 2033 03/22/18 0513 03/22/18 0519  BP: 114/64 120/89 136/83   Pulse: 71 90 77   Resp: 16 16 18    Temp:  97.7 F (36.5 C) 98.3 F (36.8 C)   TempSrc:  Oral Oral   SpO2: 96% 96% 96%   Weight:    122.1 kg  Height:        Intake/Output Summary (Last 24 hours) at 03/22/2018 1057 Last data filed at 03/22/2018 0900 Gross per 24 hour  Intake 60 ml  Output 1100 ml  Net -1040 ml   Filed Weights   03/17/18 0605 03/21/18 0427 03/22/18 0519  Weight: 107.2 kg 116.1 kg 122.1 kg    Examination:  General exam: Appears calm and comfortable  Respiratory system: Clear to auscultation. Respiratory effort normal. Cardiovascular system: S1 & S2 heard, RRR. No JVD, murmurs, rubs, gallops or clicks.  No pedal edema. Gastrointestinal system: Abdomen is nondistended, soft and nontender. No organomegaly or masses felt. Normal bowel sounds heard. Central nervous system: Alert and oriented. No focal neurological deficits. Extremities: Symmetric 5 x 5 power. Skin: No rashes, lesions or ulcers Psychiatry: Judgement and insight appear  normal. Mood & affect appropriate.     Data Reviewed: I have personally reviewed following labs and imaging studies  CBC: Recent Labs  Lab 03/17/18 0520 03/17/18 1250 03/18/18 0305  03/19/18 0313 03/19/18 1239 03/20/18 0400 03/20/18 1852 03/21/18 0349 03/22/18 0357  WBC 9.3 7.2 7.8   < > 6.7 7.7 7.3  --  6.8 7.9  NEUTROABS 6.8 5.5 6.2  --  5.1  --  5.5  --   --   --   HGB 9.7* 9.3* 8.1*   < > 7.4* 8.1* 7.3* 8.3* 8.0* 8.1*  HCT 28.8* 29.2* 25.3*   < > 23.4* 26.0* 23.2* 26.0* 25.6* 27.2*  MCV 95.7 98.3 99.6   < > 99.2 100.4* 100.0  --  99.6 102.6*  PLT 130* 127* 113*   < > 98* 118* 114*  --  139* 154   < > = values in this interval not displayed.   Basic Metabolic Panel: Recent Labs  Lab 03/17/18 0520 03/18/18 0305 03/19/18 0313 03/20/18 0400 03/21/18 0349 03/22/18 0357  NA  --  138 139 139 138 138  K  --  3.9 3.6 3.5 3.6 3.7  CL  --  108 107 107 104 105  CO2  --  27 27 26 27 28   GLUCOSE  --  137* 135* 126* 129* 115*  BUN  --  17 15 16 20  21*  CREATININE  --  0.68 0.60* 0.54* 0.57* 0.65  CALCIUM  --  8.0* 8.1* 8.3* 8.3* 8.1*  MG 1.6* 2.2 1.9 2.1  --   --   PHOS 2.9 2.4* 2.1* 2.9  --   --    GFR: Estimated Creatinine Clearance: 167.9 mL/min (by C-G formula based on SCr of 0.65 mg/dL). Liver Function Tests: Recent Labs  Lab 03/16/18 2045 03/17/18 0520 03/21/18 0349  AST 13* 17 15  ALT 17 16 19   ALKPHOS 40 35* 46  BILITOT 3.0* 1.9* 1.5*  PROT 5.7* 5.0* 5.5*  ALBUMIN 3.9 3.2* 3.2*   No results for input(s): LIPASE, AMYLASE in the last 168 hours. No results for input(s): AMMONIA in the last 168 hours. Coagulation Profile: Recent Labs  Lab 03/16/18 2045 03/17/18 0520  INR 1.2 1.3*   Cardiac Enzymes: Recent Labs  Lab 03/16/18 2045 03/17/18 0520  TROPONINI <0.03 <0.03   BNP (last 3 results) No results for input(s): PROBNP in the last 8760 hours. HbA1C: No results for input(s): HGBA1C in the last 72 hours. CBG: Recent Labs  Lab 03/21/18 1640  03/21/18 2052 03/22/18 0050 03/22/18 0523 03/22/18 0751  GLUCAP 96 149* 111* 103* 100*   Lipid Profile: No results for input(s): CHOL, HDL, LDLCALC, TRIG, CHOLHDL, LDLDIRECT in the last 72 hours. Thyroid Function Tests: No results for input(s): TSH, T4TOTAL, FREET4, T3FREE, THYROIDAB in the last 72 hours. Anemia Panel: No results for input(s): VITAMINB12, FOLATE, FERRITIN, TIBC, IRON, RETICCTPCT in the last 72 hours. Sepsis Labs: Recent Labs  Lab 03/16/18 2045 03/17/18 0555  LATICACIDVEN 1.0 1.5    No results found for this or any previous visit (from the past 240 hour(s)).       Radiology Studies: Vas [place] Upper Extremity Venous Duplex  Result Date: 03/20/2018 UPPER VENOUS STUDY  Indications: Swelling Performing Technologist: Toma Copier RVS  Examination Guidelines: A complete evaluation includes B-mode imaging, spectral Doppler, color Doppler, and power Doppler as needed of all accessible portions of each vessel. Bilateral testing is considered an integral part of a complete examination. Limited examinations for reoccurring indications may be performed as noted.  Left Findings: +--------+------------+----------+---------+-----------+-------+ LEFT    CompressiblePropertiesPhasicitySpontaneousSummary +--------+------------+----------+---------+-----------+-------+ IJV         Full                 Yes       Yes            +--------+------------+----------+---------+-----------+-------+ Axillary    Full                 Yes       Yes            +--------+------------+----------+---------+-----------+-------+ Brachial    Full                 Yes       Yes            +--------+------------+----------+---------+-----------+-------+ Radial      Full                                          +--------+------------+----------+---------+-----------+-------+ Ulnar       Full                                           +--------+------------+----------+---------+-----------+-------+ Cephalic    Full                                          +--------+------------+----------+---------+-----------+-------+ Basilic     Full                                          +--------+------------+----------+---------+-----------+-------+  Summary:  Left: No evidence of deep vein thrombosis in the upper extremity. No evidence of superficial vein thrombosis in the upper extremity. No evidence of thrombosis in the subclavian.  *See table(s) above for measurements and observations.  Diagnosing physician: Servando Snare MD Electronically signed by Servando Snare MD on 03/20/2018 at 1:23:41 PM.    Final         Scheduled Meds: . aspirin  81 mg Oral BID  . atorvastatin  40 mg Oral q1800  . bisacodyl  10 mg Oral Daily  . Chlorhexidine Gluconate Cloth  6 each Topical Daily  . docusate sodium  200 mg Oral BID  . ferrous sulfate  325 mg Oral BID WC  . gabapentin  300 mg Oral TID  . [START ON 03/23/2018] hydrocortisone sod succinate (SOLU-CORTEF) inj  50 mg Intravenous Daily  . insulin aspart  1-3 Units Subcutaneous Q4H  . levothyroxine  200 mcg Oral Daily  . mouth rinse  15 mL Mouth Rinse BID  . polyethylene glycol  17 g Oral Daily   Continuous Infusions: . methocarbamol (ROBAXIN) IV       LOS: 6 days     Georgette Shell, MD [facility]  If 7PM-7AM, please contact night-coverage  [email or web address] Password [facility] 03/22/2018, 10:57 AM

## 2018-03-22 NOTE — Progress Notes (Signed)
Hospital Course: 03/16/18: Mark Gallagher is a 48 year old male with acromegaly/complex endocrine history followed by Dr. Jerl Mina at [facility].  The patient underwent right total hip arthroplasty yesterday morning.  He received preoperative stress dose steroids.  He was stable throughout the procedure and had about 1100 mL blood loss.  Postoperatively he had significant hypotension requiring Neo-Synephrine thought to be a combination of blood loss and his complex endocrine condition.  He was given additional steroid and CCM was consulted and the patient was transferred to the ICU.  He was given albumin and received 2 units PRBC.  His hemoglobin has remained stable without sign of active blood loss.  03/17/18: Improved clinically.  Decreased need for blood pressure support with Neo-Synephrine.  Good early mobilization.  03/19/18: Continue clinical improvement.  Weaned off of Neo-Synephrine.  On Solu-Cortef 50 mg every 6 hours.  Hemoglobin continued downtrend.  No sign of hemorrhage.  03/20/18-03/22/18: Patient received 1 additional unit PRBC.  Hemoglobin has remained stable.  Has tolerated weaning of Solu-Cortef.  Still mobilizing well.  Constipated-more aggressive bowel regimen initiated.  Subjective: Patient reports pain as mild and improving. Tolerating diet.   No CP, SOB.  Continuing to mobilize.  No BM since Monday last -no N/V or abdominal pain.  Objective:   VITALS:   Vitals:   03/21/18 1506 03/21/18 2033 03/22/18 0513 03/22/18 0519  BP: 114/64 120/89 136/83   Pulse: 71 90 77   Resp: 16 16 18    Temp:  97.7 F (36.5 C) 98.3 F (36.8 C)   TempSrc:  Oral Oral   SpO2: 96% 96% 96%   Weight:    122.1 kg  Height:       CBC Latest Ref Rng & Units 03/22/2018 03/21/2018 03/20/2018  WBC 4.0 - 10.5 K/uL 7.9 6.8 -  Hemoglobin 13.0 - 17.0 g/dL 8.1(L) 8.0(L) 8.3(L)  Hematocrit 39.0 - 52.0 % 27.2(L) 25.6(L) 26.0(L)  Platelets 150 - 400 K/uL 154 139(L) -   BMP Latest Ref Rng & Units 03/22/2018  03/21/2018 03/20/2018  Glucose 70 - 99 mg/dL 115(H) 129(H) 126(H)  BUN 6 - 20 mg/dL 21(H) 20 16  Creatinine 0.61 - 1.24 mg/dL 0.65 0.57(L) 0.54(L)  Sodium 135 - 145 mmol/L 138 138 139  Potassium 3.5 - 5.1 mmol/L 3.7 3.6 3.5  Chloride 98 - 111 mmol/L 105 104 107  CO2 22 - 32 mmol/L 28 27 26   Calcium 8.9 - 10.3 mg/dL 8.1(L) 8.3(L) 8.3(L)   Intake/Output      03/01 0701 - 03/02 0700 03/02 0701 - 03/03 0700   I.V. (mL/kg)     IV Piggyback     Total Intake(mL/kg)     Urine (mL/kg/hr) 875 (0.3)    Total Output 875    Net -875            Physical Exam: General: NAD.  Upright in bed.  Calm, conversant.   Resp: No increased wob Cardio: regular rate and rhythm ABD soft Neurologically intact MSK RLE: Neurovascularly intact Sensation intact distally Feet warm and well-perfused. Dorsiflexion/Plantar/EHL/FHL intact.  Chronic Left sided foot drop. Incision: dressing C/D/I   Assessment / Plan: 6 Days Post-Op  S/P Procedure(s) (LRB): TOTAL HIP ARTHROPLASTY Anterior (Right) by Dr. Ernesta Amble. 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  Wean narcotics due to constipation.  Continue to mobilize with therapies  Needs to work on stairs   Weight Bearing: Weight Bearing as Tolerated (WBAT) RLE  Dressings: Maintain Mepilex.    VTE prophylaxis: Aspirin, SCDs, ambulation  Postoperative Circulatory Shock  Secondary to blood loss and adrenal stress  Evaluation / Treatment ongoing by medicine.    Improving.  Weaned off of neo-Synephrine.  Weaning Solu-Cortef 50 mg  now daily.  Weight up +25 lbs - getting a dose of Lasix.    ABLA: S/p 3 units PRBC over hospital course.  No sign of active bleeding.  Hgb stable.    Constipation: No N/V/abdominal pain. Increase bowel regimen.   Dispo: Plan for DC 1-2 days pending medical status, stair mobilization, constipation.    Mark Elizabeth Martensen III, PA-C 03/22/2018, 8:00 AM

## 2018-03-22 NOTE — Progress Notes (Signed)
Occupational Therapy Treatment Patient Details Name: Mark Gallagher MRN: [id number] DOB: Oct 22, 1970 Today's Date: 03/22/2018    History of present illness R DA-THA with postop hypotension; PMH of acromegaly, spinal fusion, L foot drop (wears AFO)   OT comments  Pt with edema LUE - encouraged pt to use LUE for ADL activity, keep LUe elevated as well as perforn ROM with LUE to decreased edema.   Follow Up Recommendations  Supervision/Assistance - 24 hour;Home health OT    Equipment Recommendations  3 in 1 bedside commode    Recommendations for Other Services      Precautions / Restrictions Precautions Precautions: Fall Precaution Comments: monitor BP, L AFO at baseline--has been walking without this, HGB on low side Restrictions Weight Bearing Restrictions: No Other Position/Activity Restrictions: WBAT       Mobility Bed Mobility Overal bed mobility: Needs Assistance Bed Mobility: Supine to Sit;Sit to Supine     Supine to sit: Min assist;Mod assist Sit to supine: Mod assist   General bed mobility comments: a with BLE  Transfers Overall transfer level: Needs assistance Equipment used: Rolling walker (2 wheeled) Transfers: Sit to/from [employer] Transfers Sit to Stand: From elevated surface;Min assist            Balance Overall balance assessment: Needs assistance Sitting-balance support: Feet supported Sitting balance-Leahy Scale: Good     Standing balance support: Bilateral upper extremity supported;During functional activity Standing balance-Leahy Scale: Fair                             ADL either performed or assessed with clinical judgement   ADL Overall ADL's : Needs assistance/impaired                     Lower Body Dressing: Maximal assistance;Sit to/from stand;Cueing for sequencing;Cueing for safety;+2 for safety/equipment       Toileting- Clothing Manipulation and Hygiene: Moderate assistance;Sit to/from  stand;Cueing for sequencing;Cueing for safety Toileting - Clothing Manipulation Details (indicate cue type and reason): sit to stand with urinal       General ADL Comments: pt declined walking to BR but did want to stand EOB with urinal.  Encouraged pt to pull down underwear rather than letting his mother do it.  Encouaged pt to hold urinal rather than his mother holding it.  Pt able to hold urinal and hold walker with one hand.                 Cognition Arousal/Alertness: Awake/alert Behavior During Therapy: WFL for tasks assessed/performed Overall Cognitive Status: Within Functional Limits for tasks assessed                                                     Pertinent Vitals/ Pain       Pain Score: 6  Pain Location: B thighs with bed mobility Pain Descriptors / Indicators: Aching Pain Intervention(s): Limited activity within patient's tolerance;Repositioned;Patient requesting pain meds-RN notified         Frequency  Min 2X/week        Progress Toward Goals  OT Goals(current goals can now be found in the care plan section)  Progress towards OT goals: Progressing toward goals  Acute Rehab OT Goals Patient Stated Goal: go to church, visit friends  Plan Discharge plan remains appropriate       AM-PAC OT "6 Clicks" Daily Activity     Outcome Measure   Help from another person eating meals?: None Help from another person taking care of personal grooming?: A Little Help from another person toileting, which includes using toliet, bedpan, or urinal?: A Little Help from another person bathing (including washing, rinsing, drying)?: A Little Help from another person to put on and taking off regular upper body clothing?: A Little Help from another person to put on and taking off regular lower body clothing?: A Lot 6 Click Score: 18    End of Session Equipment Utilized During Treatment: Rolling walker;Gait belt  OT Visit Diagnosis: Unsteadiness on  feet (R26.81);Pain Pain - Right/Left: Right Pain - part of body: Hip   Activity Tolerance Patient tolerated treatment well   Patient Left with call bell/phone within reach;with family/visitor present;in bed   Nurse Communication          Time: 5364-6803 OT Time Calculation (min): 23 min  Charges: OT General Charges $OT Visit: 1 Visit OT Treatments $Self Care/Home Management : 8-22 mins  Kari Baars, [place] [phone] Office- [phone], Edwena Felty D 03/22/2018, 4:49 PM

## 2018-03-22 NOTE — Progress Notes (Addendum)
Physical Therapy Treatment Patient Details Name: Mark Gallagher MRN: [id number] DOB: 01-05-1971 Today's Date: 03/22/2018    History of Present Illness R DA-THA with postop hypotension; PMH of acromegaly, spinal fusion, L foot drop (wears AFO)    PT Comments    Initiated stair training. Pt ascended/descended 5 steps with B rails with min/guard assist and verbal cues for sequencing. Also instructed pt in 1 stair backwards with no rails, with RW. Pt's parents present for stair training. Pt then ambulated 180' with RW with no loss of balance. Pt reports he has 1 flight of stairs to reach his bedroom at home, 1+6+1 stairs to enter home. Will continue working on stair training to progress towards tolerance of 1 flight of stairs. Pt is progressing well and puts forth good effort, he is very motivated. Pt is requiring physical assist for bed mobility and for transfers, but not for ambulation. Sit to stand from the recliner is challenging due to pt's height, despite height of recliner being built up.    Follow Up Recommendations  Follow surgeon's recommendation for DC plan and follow-up therapies;Home health PT;Supervision for mobility/OOB     Equipment Recommendations  None recommended by PT    Recommendations for Other Services       Precautions / Restrictions Precautions Precautions: Fall Precaution Comments: monitor BP, L AFO at baseline--has been walking without this, HGB on low side Restrictions Weight Bearing Restrictions: No Other Position/Activity Restrictions: WBAT    Mobility  Bed Mobility   Bed Mobility: Sit to Supine       Sit to supine: Min assist   General bed mobility comments: min A for LEs into bed, instructed pt to loop gait belt around R foot to assist lifting LE into bed  Transfers Overall transfer level: Needs assistance Equipment used: Rolling walker (2 wheeled) Transfers: Sit to/from Stand Sit to Stand: Min assist         General transfer comment: min  A to rise from built up recliner, pt used momentum, VCs for hand and LE placement for set up  Ambulation/Gait Ambulation/Gait assistance: Supervision Gait Distance (Feet): 180 Feet Assistive device: Rolling walker (2 wheeled) Gait Pattern/deviations: Step-through pattern;Decreased stride length Gait velocity: decr   General Gait Details: pt walked to bathroom   Stairs     Stair Management: Two rails;Step to pattern;Forwards Number of Stairs: 5 General stair comments: 5 steps forwards with 2 rails, then 1 step backwards with RW; VCs for sequencing, parents present for stair training   Wheelchair Mobility    Modified Rankin (Stroke Patients Only)       Balance Overall balance assessment: Needs assistance Sitting-balance support: Feet supported Sitting balance-Leahy Scale: Good     Standing balance support: Bilateral upper extremity supported;During functional activity Standing balance-Leahy Scale: Fair                              Cognition Arousal/Alertness: Awake/alert Behavior During Therapy: WFL for tasks assessed/performed Overall Cognitive Status: Within Functional Limits for tasks assessed                                           General Comments        Pertinent Vitals/Pain Pain Score: 4  Pain Location: B thighs with walking Pain Descriptors / Indicators: Aching Pain Intervention(s): Limited activity within patient's  tolerance;Premedicated before session    Home Living                      Prior Function            PT Goals (current goals can now be found in the care plan section) Acute Rehab PT Goals Patient Stated Goal: go to church, visit friends PT Goal Formulation: With patient Time For Goal Achievement: 03/24/18 Potential to Achieve Goals: Good Progress towards PT goals: Progressing toward goals    Frequency    7X/week      PT Plan Current plan remains appropriate    Co-evaluation               AM-PAC PT "6 Clicks" Mobility   Outcome Measure  Help needed turning from your back to your side while in a flat bed without using bedrails?: A Little Help needed moving from lying on your back to sitting on the side of a flat bed without using bedrails?: A Little Help needed moving to and from a bed to a chair (including a wheelchair)?: A Little Help needed standing up from a chair using your arms (e.g., wheelchair or bedside chair)?: A Little Help needed to walk in hospital room?: None Help needed climbing 3-5 steps with a railing? : A Little 6 Click Score: 19    End of Session Equipment Utilized During Treatment: Gait belt Activity Tolerance: Patient tolerated treatment well Patient left: with call bell/phone within reach;Other (comment)(on commode) Nurse Communication: Mobility status PT Visit Diagnosis: Muscle weakness (generalized) (M62.81);Difficulty in walking, not elsewhere classified (R26.2);Pain Pain - Right/Left: Right Pain - part of body: Hip     Time: 1779-3903 PT Time Calculation (min) (ACUTE ONLY): 31 min  Charges:  $Gait Training: 8-22 mins   $Self Care/Home Management: 8-22                     [name] PT 03/22/2018  Acute Rehabilitation Services Pager [phone] Office [phone]

## 2018-03-22 NOTE — Progress Notes (Signed)
Physical Therapy Treatment Patient Details Name: Mark Gallagher MRN: [id number] DOB: 1970-12-05 Today's Date: 03/22/2018    History of Present Illness R DA-THA with postop hypotension; PMH of acromegaly, spinal fusion, L foot drop (wears AFO)    PT Comments    Reviewed THA HEP, pt demonstrates good understanding. Pt ambulated to bathroom. Will return later for stair training as pt needed time to attempt to have BM.   Follow Up Recommendations  Follow surgeon's recommendation for DC plan and follow-up therapies;Home health PT;Supervision for mobility/OOB     Equipment Recommendations  None recommended by PT    Recommendations for Other Services       Precautions / Restrictions Precautions Precautions: Fall Precaution Comments: monitor BP, L AFO at baseline--has been walking without this, HGB on low side Restrictions Weight Bearing Restrictions: No Other Position/Activity Restrictions: WBAT    Mobility  Bed Mobility               General bed mobility comments: up in recliner  Transfers Overall transfer level: Needs assistance Equipment used: Rolling walker (2 wheeled) Transfers: Sit to/from Stand Sit to Stand: Min assist         General transfer comment: min A to rise from built up recliner, pt used momentum, VCs for hand and LE placement for set up  Ambulation/Gait Ambulation/Gait assistance: Supervision Gait Distance (Feet): 15 Feet Assistive device: Rolling walker (2 wheeled) Gait Pattern/deviations: Step-through pattern;Decreased stride length Gait velocity: decr   General Gait Details: pt walked to bathroom   Stairs       Number of Stairs: 5     Wheelchair Mobility    Modified Rankin (Stroke Patients Only)       Balance Overall balance assessment: Needs assistance Sitting-balance support: Feet supported Sitting balance-Leahy Scale: Good     Standing balance support: Bilateral upper extremity supported;During functional  activity Standing balance-Leahy Scale: Fair                              Cognition Arousal/Alertness: Awake/alert Behavior During Therapy: WFL for tasks assessed/performed Overall Cognitive Status: Within Functional Limits for tasks assessed                                        Exercises Total Joint Exercises Ankle Circles/Pumps: AROM;Right;10 reps;Supine Quad Sets: AROM;5 reps;Both;Supine Short Arc Quad: AROM;Right;10 reps;Supine Heel Slides: AAROM;10 reps;Supine;Right Hip ABduction/ADduction: AAROM;Right;10 reps;Supine    General Comments        Pertinent Vitals/Pain Pain Score: 3  Pain Location: R thigh with exercises Pain Descriptors / Indicators: Aching Pain Intervention(s): Limited activity within patient's tolerance;Monitored during session;Premedicated before session    Home Living                      Prior Function            PT Goals (current goals can now be found in the care plan section) Acute Rehab PT Goals Patient Stated Goal: go to church, visit friends PT Goal Formulation: With patient Time For Goal Achievement: 03/24/18 Potential to Achieve Goals: Good Progress towards PT goals: Progressing toward goals    Frequency    7X/week      PT Plan Current plan remains appropriate    Co-evaluation  AM-PAC PT "6 Clicks" Mobility   Outcome Measure  Help needed turning from your back to your side while in a flat bed without using bedrails?: A Little Help needed moving from lying on your back to sitting on the side of a flat bed without using bedrails?: A Little Help needed moving to and from a bed to a chair (including a wheelchair)?: A Little Help needed standing up from a chair using your arms (e.g., wheelchair or bedside chair)?: A Little Help needed to walk in hospital room?: A Little Help needed climbing 3-5 steps with a railing? : A Little 6 Click Score: 18    End of Session  Equipment Utilized During Treatment: Gait belt Activity Tolerance: Patient tolerated treatment well Patient left: with call bell/phone within reach;Other (comment)(on commode) Nurse Communication: Mobility status PT Visit Diagnosis: Muscle weakness (generalized) (M62.81);Difficulty in walking, not elsewhere classified (R26.2);Pain Pain - Right/Left: Right Pain - part of body: Hip     Time: 8366-2947 PT Time Calculation (min) (ACUTE ONLY): 26 min  Charges:  $Therapeutic Exercise: 8-22 mins $Therapeutic Activity: 8-22 mins                     [name] PT 03/22/2018  Acute Rehabilitation Services Pager [phone] Office [phone]

## 2018-03-22 NOTE — Progress Notes (Signed)
Physical Therapy Treatment Patient Details Name: Mark Gallagher MRN: [id number] DOB: [birth year] Today's Date: 03/22/2018    History of Present Illness R DA-THA with postop hypotension; PMH of acromegaly, spinal fusion, L foot drop (wears AFO)    PT Comments    Pt ambulated 180' with RW, no loss of balance, HR 119 max, no dyspnea. B thigh pain limited ambulation tolerance, pain meds requested. Will return for stair training and for THA HEP.   Follow Up Recommendations  Follow surgeon's recommendation for DC plan and follow-up therapies;Home health PT;Supervision for mobility/OOB     Equipment Recommendations  None recommended by PT    Recommendations for Other Services       Precautions / Restrictions Precautions Precautions: Fall Precaution Comments: monitor BP, L AFO at baseline--has been walking without this, HGB on low side Restrictions Weight Bearing Restrictions: No Other Position/Activity Restrictions: WBAT    Mobility  Bed Mobility               General bed mobility comments: up in bathroom  Transfers Overall transfer level: Needs assistance Equipment used: Rolling walker (2 wheeled) Transfers: Sit to/from Stand Sit to Stand: Min guard         General transfer comment: min/guard to control descent to built up recliner, VCs hand placement  Ambulation/Gait Ambulation/Gait assistance: Supervision Gait Distance (Feet): 180 Feet Assistive device: Rolling walker (2 wheeled) Gait Pattern/deviations: Step-through pattern;Decreased stride length Gait velocity: decr   General Gait Details: steady with RW, no loss of balance, HR 119 max with walking, no dyspnea, distance limited by BLE pain, pain meds requested   Stairs             Wheelchair Mobility    Modified Rankin (Stroke Patients Only)       Balance Overall balance assessment: Needs assistance Sitting-balance support: Feet supported Sitting balance-Leahy Scale: Good     Standing  balance support: Bilateral upper extremity supported;During functional activity Standing balance-Leahy Scale: Fair                              Cognition Arousal/Alertness: Awake/alert Behavior During Therapy: WFL for tasks assessed/performed Overall Cognitive Status: Within Functional Limits for tasks assessed                                        Exercises Total Joint Exercises Ankle Circles/Pumps: AROM;Right;10 reps    General Comments        Pertinent Vitals/Pain Pain Score: 4  Pain Location: B thighs with walking and sit to stand Pain Descriptors / Indicators: Aching Pain Intervention(s): Patient requesting pain meds-RN notified    Home Living                      Prior Function            PT Goals (current goals can now be found in the care plan section) Acute Rehab PT Goals Patient Stated Goal: go to church, visit friends PT Goal Formulation: With patient/family Time For Goal Achievement: 03/24/18 Potential to Achieve Goals: Good Progress towards PT goals: Progressing toward goals    Frequency    7X/week      PT Plan Current plan remains appropriate    Co-evaluation              AM-PAC PT "6 Clicks"  Mobility   Outcome Measure  Help needed turning from your back to your side while in a flat bed without using bedrails?: A Little Help needed moving from lying on your back to sitting on the side of a flat bed without using bedrails?: A Little Help needed moving to and from a bed to a chair (including a wheelchair)?: A Little Help needed standing up from a chair using your arms (e.g., wheelchair or bedside chair)?: A Little Help needed to walk in hospital room?: A Little Help needed climbing 3-5 steps with a railing? : A Lot 6 Click Score: 17    End of Session Equipment Utilized During Treatment: Gait belt Activity Tolerance: Patient limited by pain Patient left: with call bell/phone within reach;in  chair;with family/visitor present Nurse Communication: Mobility status PT Visit Diagnosis: Muscle weakness (generalized) (M62.81);Difficulty in walking, not elsewhere classified (R26.2);Pain Pain - Right/Left: Right Pain - part of body: Hip     Time: 9702-6378 PT Time Calculation (min) (ACUTE ONLY): 24 min  Charges:  $Gait Training: 8-22 mins $Therapeutic Activity: 8-22 mins                     [name] PT 03/22/2018  Acute Rehabilitation Services Pager [phone] Office [phone]

## 2018-03-22 NOTE — Progress Notes (Addendum)
Patient had a small BM after dulcolax suppository.  Patient refusing smog enema at this time.  States he is exhausted from working with physical therapy today and needs to rest.  Patient is agreeable to trying enema this evening.  Educated patient on importance of having a good BM post surgery before he goes home.

## 2018-03-23 LAB — GLUCOSE, CAPILLARY
Glucose-Capillary: 100 mg/dL — ABNORMAL HIGH (ref 70–99)
Glucose-Capillary: 97 mg/dL (ref 70–99)
Glucose-Capillary: 104 mg/dL — ABNORMAL HIGH (ref 70–99)
Glucose-Capillary: 134 mg/dL — ABNORMAL HIGH (ref 70–99)
Glucose-Capillary: 103 mg/dL — ABNORMAL HIGH (ref 70–99)

## 2018-03-23 MED ORDER — HYDROCORTISONE 10 MG PO TABS: ORAL_TABLET | ORAL | 0 refills | Status: AC

## 2018-03-23 MED ORDER — BISMUTH SUBSALICYLATE 262 MG/15ML PO SUSP
30.0000 mL | ORAL | Status: DC | PRN
  Administered 2018-03-23: 30 mL via ORAL
  Filled 2018-03-23: qty 236

## 2018-03-23 MED ORDER — HYDROCORTISONE 10 MG PO TABS
10.0000 mg | ORAL_TABLET | Freq: Every day | ORAL | Status: DC
  Administered 2018-03-23: 10 mg via ORAL
  Filled 2018-03-23: qty 1

## 2018-03-23 MED ORDER — BISMUTH SUBSALICYLATE 262 MG/15ML PO SUSP: 30.0000 mL | ORAL | Status: DC | PRN

## 2018-03-23 NOTE — Progress Notes (Signed)
Patient with very good results following SMOG enema. Patient was able to tolerate about half of the bottle. Reports that he feels much better. Will continue to monitor patient.

## 2018-03-23 NOTE — Progress Notes (Signed)
Occupational Therapy Treatment Patient Details Name: Mark Gallagher MRN: [id number] DOB: Aug 15, 1970 Today's Date: 03/23/2018    History of present illness R DA-THA with postop hypotension; PMH of acromegaly, spinal fusion, L foot drop (wears AFO)   OT comments  Pt needed encouragement.  Pt will have great support at home.    Follow Up Recommendations  Supervision/Assistance - 24 hour;Home health OT    Equipment Recommendations  3 in 1 bedside commode       Precautions / Restrictions Precautions Precautions: Fall Restrictions Weight Bearing Restrictions: No Other Position/Activity Restrictions: WBAT       Mobility Bed Mobility Overal bed mobility: Needs Assistance Bed Mobility: Supine to Sit;Sit to Supine     Supine to sit: Min assist;Mod assist Sit to supine: Mod assist   General bed mobility comments: a with BLE  Transfers Overall transfer level: Needs assistance Equipment used: Rolling walker (2 wheeled) Transfers: Sit to/from [employer] Transfers Sit to Stand: From elevated surface;Min assist              Balance Overall balance assessment: Needs assistance Sitting-balance support: Feet supported Sitting balance-Leahy Scale: Good     Standing balance support: Bilateral upper extremity supported;During functional activity Standing balance-Leahy Scale: Fair                             ADL either performed or assessed with clinical judgement   ADL Overall ADL's : Needs assistance/impaired Eating/Feeding: Set up;Sitting   Grooming: Set up;Sitting               Lower Body Dressing: Maximal assistance;Sit to/from stand;Cueing for sequencing;Cueing for safety;With adaptive equipment;Cueing for compensatory techniques Lower Body Dressing Details (indicate cue type and reason): long AE issued Toilet Transfer: Minimal assistance;Ambulation;BSC;RW Toilet Transfer Details (indicate cue type and reason): increased time Toileting-  Clothing Manipulation and Hygiene: Moderate assistance;Sit to/from stand;Cueing for sequencing;Cueing for safety     Tub/Shower Transfer Details (indicate cue type and reason): encouraged pt to perform shower transfer with [facility] therapist .  Pt not up for trying this with OT this day Functional mobility during ADLs: Minimal assistance;Rolling walker;Cueing for safety;Cueing for sequencing General ADL Comments: increased time for all tasks     Vision Patient Visual Report: No change from baseline            Cognition Arousal/Alertness: Awake/alert Behavior During Therapy: WFL for tasks assessed/performed Overall Cognitive Status: Within Functional Limits for tasks assessed                                                     Pertinent Vitals/ Pain       Pain Score: 6  Pain Location: B thighs with bed mobility Pain Descriptors / Indicators: Aching;Sore Pain Intervention(s): Limited activity within patient's tolerance;Repositioned;Monitored during session         Frequency  Min 2X/week        Progress Toward Goals  OT Goals(current goals can now be found in the care plan section)  Progress towards OT goals: Progressing toward goals     Plan Discharge plan remains appropriate       AM-PAC OT "6 Clicks" Daily Activity     Outcome Measure   Help from another person eating meals?: None Help from another person taking  care of personal grooming?: A Little Help from another person toileting, which includes using toliet, bedpan, or urinal?: A Little Help from another person bathing (including washing, rinsing, drying)?: A Little Help from another person to put on and taking off regular upper body clothing?: A Little Help from another person to put on and taking off regular lower body clothing?: A Lot 6 Click Score: 18    End of Session Equipment Utilized During Treatment: Rolling walker;Gait belt  OT Visit Diagnosis: Unsteadiness on feet  (R26.81);Pain Pain - Right/Left: Right Pain - part of body: Hip   Activity Tolerance Patient tolerated treatment well   Patient Left with call bell/phone within reach;with family/visitor present;in bed   Nurse Communication          Time: 7356-7014 OT Time Calculation (min): 42 min  Charges: OT General Charges $OT Visit: 1 Visit OT Treatments $Self Care/Home Management : 38-52 mins  Kari Baars, [place] [phone] Office- [place], Edwena Felty D 03/23/2018, 1:37 PM

## 2018-03-23 NOTE — Progress Notes (Signed)
Went over all  D/C instructions with patient including medications instructions, with a focus on signs and symptoms of infection and when to call his surgeon. Pt. Understood that he needed to make a follow up appointment with his PCP, ortho and his endocrinologist. IV was removed and is WNL. Pt. verbalized an understanding of all instructions and had no questions at this time.

## 2018-03-23 NOTE — Discharge Summary (Signed)
Discharge Summary  Patient ID: Mark Gallagher MRN: [id number] DOB/AGE: 1971/01/02 48 y.o.  Admit date: 03/16/2018 Discharge date: 03/23/2018  Admission Diagnoses:  Primary osteoarthritis of right hip  Discharge Diagnoses:  Principal Problem:   Primary osteoarthritis of right hip Active Problems:   Acromegaly (Mark Gallagher)   Hypothyroidism   History of fusion of cervical spine   Iron deficiency anemia   Acquired left foot drop   AVNRT (AV nodal re-entry tachycardia) (Mark Gallagher)   Primary localized osteoarthritis of hip   S/P total hip arthroplasty   Shock circulatory (Mark Gallagher)   Past Medical History:  Diagnosis Date  . Acromegaly (Mark Gallagher)   . Arthritis   . Cancer (Mark Gallagher)   . History of pituitary tumor   . Hypertension   . Hypothyroidism   . Knee pain, bilateral   . Pneumonia   . Testosterone deficiency   . Thyroid disease     Surgeries: Procedure(s): TOTAL HIP ARTHROPLASTY Anterior on 03/16/2018   Consultants (if any): Treatment Team:  Fatima Blank, MD  Discharged Condition: Improved  Hospital Course: Mark Gallagher is an 48 y.o. male who was admitted 03/16/2018 with a diagnosis of Primary osteoarthritis of right hip and went to the operating room on 03/16/2018 and underwent the above named procedures.    He was given perioperative antibiotics:  Anti-infectives (From admission, onward)   Start     Dose/Rate Route Frequency Ordered Stop   03/16/18 1930  ceFAZolin (ANCEF) IVPB 1 g/50 mL premix     1 g 100 mL/hr over 30 Minutes Intravenous Every 6 hours 03/16/18 1907 03/17/18 0146   03/16/18 0745  ceFAZolin (ANCEF) IVPB 2g/100 mL premix     2 g 200 mL/hr over 30 Minutes Intravenous On call to O.R. 03/16/18 0740 03/16/18 1017    .  He was given sequential compression devices, early ambulation, and Aspirin for DVT prophylaxis.  Hospital Course: 03/16/18: Mr. Santellan is a 48 year old male with acromegaly/complex endocrine history followed by Dr. Jerl Mina at [facility].  The patient  underwent right total hip arthroplasty yesterday morning.  He received preoperative stress dose steroids.  He was stable throughout the procedure and had about 1100 mL blood loss.  Postoperatively he had significant hypotension requiring Neo-Synephrine thought to be a combination of blood loss and his complex endocrine condition.  He was given additional steroid and CCM was consulted and the patient was transferred to the ICU.  He was given albumin and received 2 units PRBC.  His hemoglobin has remained stable without sign of active blood loss.  03/17/18: Improved clinically.  Decreased need for blood pressure support with Neo-Synephrine.  Good early mobilization.  03/19/18: Continue clinical improvement.  Weaned off of Neo-Synephrine.  On Solu-Cortef 50 mg every 6 hours.  Hemoglobin continued downtrend.  No sign of hemorrhage.  03/20/18-03/22/18: Patient received 1 additional unit PRBC.  Hemoglobin has remained stable.  Has tolerated weaning of Solu-Cortef.  Still mobilizing well.  Constipated-more aggressive bowel regimen initiated.  03/23/18: AFVSN.  Tolerated weaning Solu-Cortef.  Mobilizing well including stairs.  +BM yesterday.  Cleared for discharge by medicine team.  He will continue to hold Cozaar and metoprolol.  He will resume eplerenone.   He benefited maximally from the hospital stay.    Recent vital signs:  Vitals:   03/22/18 2114 03/23/18 0437  BP: 114/65 126/78  Pulse: (!) 50 83  Resp: 18 17  Temp: 98.9 F (37.2 C) 98.5 F (36.9 C)  SpO2: 98% 97%    Recent  laboratory studies:  Lab Results  Component Value Date   HGB 8.1 (L) 03/22/2018   HGB 8.0 (L) 03/21/2018   HGB 8.3 (L) 03/20/2018   Lab Results  Component Value Date   WBC 7.9 03/22/2018   PLT 154 03/22/2018   Lab Results  Component Value Date   INR 1.3 (H) 03/17/2018   Lab Results  Component Value Date   NA 138 03/22/2018   K 3.7 03/22/2018   CL 105 03/22/2018   CO2 28 03/22/2018   BUN 21 (H)  03/22/2018   CREATININE 0.65 03/22/2018   GLUCOSE 115 (H) 03/22/2018    Discharge Medications:   Allergies as of 03/23/2018      Reactions   Oxycodone Nausea Only      Medication List    STOP taking these medications   losartan 25 MG tablet Commonly known as:  COZAAR   metoprolol succinate 25 MG 24 hr tablet Commonly known as:  TOPROL-XL     TAKE these medications   aspirin EC 81 MG tablet Take 1 tablet (81 mg total) by mouth 2 (Mark) times daily. For DVT prophylaxis for 30 days after surgery.   atorvastatin 40 MG tablet Commonly known as:  LIPITOR Take 40 mg by mouth daily.   CENTRUM SILVER PO Take 1 tablet by mouth daily.   docusate sodium 250 MG capsule Commonly known as:  COLACE Take 250 mg by mouth daily.   eplerenone 50 MG tablet Commonly known as:  INSPRA Take 50 mg by mouth daily.   ferrous sulfate 325 (65 FE) MG tablet Take 1 tablet (325 mg total) by mouth 2 (Mark) times daily with a meal.   gabapentin 300 MG capsule Commonly known as:  NEURONTIN Take 1 capsule (300 mg total) by mouth 2 (Mark) times daily for 14 days. For 2 weeks post op for pain.   HYDROcodone-acetaminophen 7.5-325 MG tablet Commonly known as:  NORCO Take 1-2 tablets by mouth every 6 (six) hours as needed for up to 7 days for moderate pain.   hydrocortisone 10 MG tablet Commonly known as:  CORTEF Take 1 tablet daily.  You may take 2 tablets daily with excessive weakness and tiredness and lethargy. What changed:    how much to take  how to take this  when to take this  additional instructions   levothyroxine 200 MCG tablet Commonly known as:  SYNTHROID, LEVOTHROID Take 1 tablet (200 mcg total) by mouth daily.   methocarbamol 750 MG tablet Commonly known as:  ROBAXIN-750 Take 1 tablet (750 mg total) by mouth every 6 (six) hours as needed for muscle spasms.   naproxen sodium 220 MG tablet Commonly known as:  ALEVE Take 220-440 mg by mouth daily as needed (for pain or  headache).   octreotide 30 MG injection Commonly known as:  SANDOSTATIN LAR Inject 30 mg into the muscle every 28 (twenty-eight) days.   ondansetron 4 MG tablet Commonly known as:  ZOFRAN Take 1 tablet (4 mg total) by mouth every 8 (eight) hours as needed for nausea or vomiting.   testosterone cypionate 200 MG/ML injection Commonly known as:  DEPOTESTOSTERONE CYPIONATE Inject 1 mL (200 mg total) into the muscle every 14 (fourteen) days.   Vitamin D3 25 MCG (1000 UT) Caps Take 2,000 Units by mouth daily.       Diagnostic Studies: Dg Pelvis 1-2 Views  Result Date: 03/17/2018 CLINICAL DATA:  Arthroplasty, pelvic pain EXAM: PELVIS - 1-2 VIEW COMPARISON:  03/16/2018 FINDINGS: There appears  to be some irregularity in the inferolateral iliac bone just superior to the acetabulum on one view. Cannot exclude fracture. Recommend dedicated right hip images. Changes of right hip replacement partially imaged. Moderate to advanced degenerative changes in the left hip. IMPRESSION: Changes of right hip replacement. Cortical irregularity in the lateral inferior right iliac bone just superior to the acetabulum. Cannot exclude fracture. Recommend dedicated right hip images. Electronically Signed   By: Rolm Baptise M.D.   On: 03/17/2018 18:59   Dg C-arm 1-60 Min-no Report  Result Date: 03/16/2018 CLINICAL DATA:  48 year old male for right hip replacement. Initial encounter. EXAM: OPERATIVE right HIP (WITH PELVIS IF PERFORMED) 2 VIEWS TECHNIQUE: Fluoroscopic spot image(s) were submitted for interpretation post-operatively. Fluoroscopic time: 28 seconds. COMPARISON:  12/15/2016. FINDINGS: Mark intraoperative C-arm views submitted for review after surgery. This reveals right hip prosthesis in place which appears in satisfactory position without complication noted on frontal imaging. This can be assessed on follow-up. IMPRESSION: Post total right hip replacement. Electronically Signed   By: Genia Del M.D.   On:  03/16/2018 14:06   Dg Hip Operative Unilat W Or W/o Pelvis Right  Result Date: 03/16/2018 CLINICAL DATA:  48 year old male for right hip replacement. Initial encounter. EXAM: OPERATIVE right HIP (WITH PELVIS IF PERFORMED) 2 VIEWS TECHNIQUE: Fluoroscopic spot image(s) were submitted for interpretation post-operatively. Fluoroscopic time: 28 seconds. COMPARISON:  12/15/2016. FINDINGS: Mark intraoperative C-arm views submitted for review after surgery. This reveals right hip prosthesis in place which appears in satisfactory position without complication noted on frontal imaging. This can be assessed on follow-up. IMPRESSION: Post total right hip replacement. Electronically Signed   By: Genia Del M.D.   On: 03/16/2018 14:06   Vas [place] Lower Extremity Venous (dvt)  Result Date: 03/20/2018  Lower Venous Study Indications: Pain, and Swelling.  Risk Factors: Surgery Right Hip. Performing Technologist: Toma Copier, D  Examination Guidelines: A complete evaluation includes B-mode imaging, spectral Doppler, color Doppler, and power Doppler as needed of all accessible portions of each vessel. Bilateral testing is considered an integral part of a complete examination. Limited examinations for reoccurring indications may be performed as noted.  Right Venous Findings: +---------+---------------+---------+-----------+----------+------------------+          CompressibilityPhasicitySpontaneityPropertiesSummary            +---------+---------------+---------+-----------+----------+------------------+ CFV      Full           Yes      Yes                                     +---------+---------------+---------+-----------+----------+------------------+ SFJ      Full                                                            +---------+---------------+---------+-----------+----------+------------------+ FV Prox  Full           Yes      Yes                                      +---------+---------------+---------+-----------+----------+------------------+ FV Mid   Full                                                            +---------+---------------+---------+-----------+----------+------------------+  FV DistalFull           Yes      Yes                                     +---------+---------------+---------+-----------+----------+------------------+ PFV      Full           Yes      Yes                                     +---------+---------------+---------+-----------+----------+------------------+ POP      Full           Yes      Yes                                     +---------+---------------+---------+-----------+----------+------------------+ PTV      Full                                                            +---------+---------------+---------+-----------+----------+------------------+ PERO     Full                                         Difficult to image +---------+---------------+---------+-----------+----------+------------------+  Left Venous Findings: +---------+---------------+---------+-----------+----------+-------------------+          CompressibilityPhasicitySpontaneityPropertiesSummary             +---------+---------------+---------+-----------+----------+-------------------+ CFV      Full           Yes      Yes                                      +---------+---------------+---------+-----------+----------+-------------------+ SFJ      Full                                                             +---------+---------------+---------+-----------+----------+-------------------+ FV Prox  Full           Yes      Yes                                      +---------+---------------+---------+-----------+----------+-------------------+ FV Mid   Full                                                              +---------+---------------+---------+-----------+----------+-------------------+ FV DistalFull           Yes      Yes                                      +---------+---------------+---------+-----------+----------+-------------------+  PFV      Full           Yes      Yes                                      +---------+---------------+---------+-----------+----------+-------------------+ POP      Full           Yes      Yes                                      +---------+---------------+---------+-----------+----------+-------------------+ PTV      Full                                                             +---------+---------------+---------+-----------+----------+-------------------+ PERO                                                  Unable to image                                                           well enough to                                                            evaluate            +---------+---------------+---------+-----------+----------+-------------------+  Left Technical Findings: Not visualized segments include peroneal. Unable to image well enough to fully evaluate.   Summary: Right: There is no evidence of deep vein thrombosis in the lower extremity. No cystic structure found in the popliteal fossa. Left: There is no evidence of deep vein thrombosis in the veins able to be fully evaluated in the lower extremity. No cystic structure found in the popliteal fossa. See technical findings listed above.  *See table(s) above for measurements and observations. Electronically signed by Servando Snare MD on 03/20/2018 at 1:24:05 PM.    Final    Vas [place] Upper Extremity Venous Duplex  Result Date: 03/20/2018 UPPER VENOUS STUDY  Indications: Swelling Performing Technologist: Toma Copier RVS  Examination Guidelines: A complete evaluation includes B-mode imaging, spectral Doppler, color Doppler, and power Doppler as needed of all  accessible portions of each vessel. Bilateral testing is considered an integral part of a complete examination. Limited examinations for reoccurring indications may be performed as noted.  Left Findings: +--------+------------+----------+---------+-----------+-------+ LEFT    CompressiblePropertiesPhasicitySpontaneousSummary +--------+------------+----------+---------+-----------+-------+ IJV         Full                 Yes       Yes            +--------+------------+----------+---------+-----------+-------+  Axillary    Full                 Yes       Yes            +--------+------------+----------+---------+-----------+-------+ Brachial    Full                 Yes       Yes            +--------+------------+----------+---------+-----------+-------+ Radial      Full                                          +--------+------------+----------+---------+-----------+-------+ Ulnar       Full                                          +--------+------------+----------+---------+-----------+-------+ Cephalic    Full                                          +--------+------------+----------+---------+-----------+-------+ Basilic     Full                                          +--------+------------+----------+---------+-----------+-------+  Summary:  Left: No evidence of deep vein thrombosis in the upper extremity. No evidence of superficial vein thrombosis in the upper extremity. No evidence of thrombosis in the subclavian.  *See table(s) above for measurements and observations.  Diagnosing physician: Servando Snare MD Electronically signed by Servando Snare MD on 03/20/2018 at 1:23:41 PM.    Final     Disposition: Discharge disposition: 01-Home or Self Care       Discharge Instructions    Discharge patient   Complete by:  As directed    Discharge disposition:  01-Home or Self Care   Discharge patient date:  03/23/2018      Follow-up Information     Renette Butters, MD.   Specialty:  Orthopedic Surgery Contact information: [address] [place] [address] [place] [address] [phone]        Illa Level Follow up.   Contact information: [place] [address] [phone]        Wendie Agreste, MD Follow up.   Specialties:  Family Medicine, Sports Medicine Contact information: [place] [address] [phone]            Signed: Prudencio Burly III PA-C 03/23/2018, 1:02 PM

## 2018-03-23 NOTE — Progress Notes (Signed)
Hospital Course: 03/16/18: Mark Gallagher is a 48 year old male with acromegaly/complex endocrine history followed by Dr. Jerl Mina at [facility].  The patient underwent right total hip arthroplasty yesterday morning.  He received preoperative stress dose steroids.  He was stable throughout the procedure and had about 1100 mL blood loss.  Postoperatively he had significant hypotension requiring Neo-Synephrine thought to be a combination of blood loss and his complex endocrine condition.  He was given additional steroid and CCM was consulted and the patient was transferred to the ICU.  He was given albumin and received 2 units PRBC.  His hemoglobin has remained stable without sign of active blood loss.  03/17/18: Improved clinically.  Decreased need for blood pressure support with Neo-Synephrine.  Good early mobilization.  03/19/18: Continue clinical improvement.  Weaned off of Neo-Synephrine.  On Solu-Cortef 50 mg every 6 hours.  Hemoglobin continued downtrend.  No sign of hemorrhage.  03/20/18-03/22/18: Patient received 1 additional unit PRBC.  Hemoglobin has remained stable.  Has tolerated weaning of Solu-Cortef.  Still mobilizing well.  Constipated-more aggressive bowel regimen initiated.  03/23/18: AFVSN.  Continued tolerance weaning Solu-Cortef.  Mobilizing well including stairs.  +BM yesterday.  Subjective: Patient reports pain as mild. Tolerating diet.   No CP, SOB.  Continuing to mobilize including stairs.  +BM.  Objective:   VITALS:   Vitals:   03/22/18 0519 03/22/18 1339 03/22/18 2114 03/23/18 0437  BP:  113/63 114/65 126/78  Pulse:  60 (!) 50 83  Resp:  16 18 17   Temp:  98 F (36.7 C) 98.9 F (37.2 C) 98.5 F (36.9 C)  TempSrc:  Oral Oral Oral  SpO2:  96% 98% 97%  Weight: 122.1 kg   126.4 kg  Height:       CBC Latest Ref Rng & Units 03/22/2018 03/21/2018 03/20/2018  WBC 4.0 - 10.5 K/uL 7.9 6.8 -  Hemoglobin 13.0 - 17.0 g/dL 8.1(L) 8.0(L) 8.3(L)  Hematocrit 39.0 - 52.0 % 27.2(L)  25.6(L) 26.0(L)  Platelets 150 - 400 K/uL 154 139(L) -   BMP Latest Ref Rng & Units 03/22/2018 03/21/2018 03/20/2018  Glucose 70 - 99 mg/dL 115(H) 129(H) 126(H)  BUN 6 - 20 mg/dL 21(H) 20 16  Creatinine 0.61 - 1.24 mg/dL 0.65 0.57(L) 0.54(L)  Sodium 135 - 145 mmol/L 138 138 139  Potassium 3.5 - 5.1 mmol/L 3.7 3.6 3.5  Chloride 98 - 111 mmol/L 105 104 107  CO2 22 - 32 mmol/L 28 27 26   Calcium 8.9 - 10.3 mg/dL 8.1(L) 8.3(L) 8.3(L)   Intake/Output      03/02 0701 - 03/03 0700   P.O. 660   Total Intake(mL/kg) 660 (5.2)   Urine (mL/kg/hr) 1375 (0.5)   Stool 0   Total Output 1375   Net -715       Urine Occurrence 7 x   Stool Occurrence 7 x      Physical Exam: General: NAD.  Upright in bed.  Calm, conversant.   Resp: No increased wob Cardio: regular rate and rhythm ABD soft Neurologically intact MSK RLE: Neurovascularly intact Sensation intact distally Feet warm and well-perfused. Dorsiflexion/Plantar/EHL/FHL intact.  Chronic Left sided foot drop. Incision: dressing C/D/I   Assessment / Plan: 7 Days Post-Op  S/P Procedure(s) (LRB): TOTAL HIP ARTHROPLASTY Anterior (Right) by Dr. Ernesta Amble. Murphy on 03/16/2018  Principal Problem:   Primary osteoarthritis of right hip Active Problems:   Acromegaly ([place])   Hypothyroidism   History of fusion of cervical spine   Iron deficiency  anemia   Acquired left foot drop   AVNRT (AV nodal re-entry tachycardia) ([place])   Primary localized osteoarthritis of hip   S/P total hip arthroplasty   Shock circulatory ([place])  Primary osteoarthritis, status post total hip arthroplasty   Weight Bearing: Weight Bearing as Tolerated (WBAT) RLE  Dressings: Maintain Mepilex.    VTE prophylaxis: Aspirin, SCDs, ambulation  Pain Mgmt: Continue current regimen.  Minimize Narcotics.  Postoperative Circulatory Shock  Secondary to blood loss and adrenal stress  Evaluation / Treatment ongoing by medicine.    Improved.  Weaned off of neo-Synephrine.   Weaning Solu-Cortef 50 mg now daily.  HTN meds held.  ABLA: S/p 3 units PRBC over hospital course.  No sign of active bleeding.  Hgb stable.    Constipation: +BM 03/22/18. No N/V/abd pain. Continue bowel regimen.   Dispo: D/C to home if ready medically and has solidified plan for home HTN medicines and endocrine follow up.   Mark Elizabeth Martensen III, PA-C 03/23/2018, 6:52 AM

## 2018-03-23 NOTE — Progress Notes (Signed)
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 Status post right total hip via anterior approach on 2/25 with a EBL at 1.5 L. Postoperatively was hypotensive requiring both crystalloid and blood replacement. Was transfused, received hydrocortisone total of 150 mg throughout that day, and in spite of this remained hypotensive.  Patient was admitted for elective hip replacement 03/16/2018. She received Neo-Synephrine stress dose steroids 2/26 for hypotension.   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 Blood pressure seems to have stabilized.  Start eplerenone upon discharge.  Patient received a dose of Lasix 20 mg yesterday with good urine output and decreased edema.  I would still hold his Cozaar and metoprolol in view of his soft blood pressure and periods  of bradycardia.  Patient is due to have a Holter monitor placed at [facility].  #2 status post right hip replacement through anterior approach for primary osteoarthritis.  Per Ortho.  On aspirin 81 mg twice a day for DVT prophylaxis.  #3 acute blood loss anemia and thrombocytopenia anticoagulation on hold hemoglobin8after one unit prbc. Stable.  #4 history of chronic systolic heart failure atrial flutter ablation patient was taking metoprolol Cozaar andeplerenoneat home.  Will restart eplerenone upon discharge.    #5 history of panhypopituitarism with  pituitary tumor follows up at [facility] endocrinology. He has an appointment on March 24 at [facility]. Continue with home dose of steroids and all the other replacements.  Discussed with Dr. Benson Norway with [facility].  Patient can be discharged on 10 mg of hydrocortisone daily.  However she did advise that if he if he is extremely lethargic tired week she he could take an extra dose of hydrocortisone on those days.    #6 increasing edema patient complains of increased scrotal swelling and left upper extremity and bilateral lower extremity swelling.Dopplers negative.  #7 constipation  resolved with Dulcolax MiraLAX suppository and enema.  Patient will need ongoing bowel support upon discharge.   Estimated body mass index is 31.16 kg/m as calculated from the following:   Height as of this encounter: 6\' 7"  (2.007 m).   Weight as of this encounter: 125.5 kg.  DVT prophylaxis: Aspirin twice a day Code Status: Full code Family Communication: Discussed with mom Disposition Plan: Okay to discharge home from medical standpoint.   Subjective: Doing well eager to go home  Objective: Vitals:   03/22/18 1339 03/22/18 2114 03/23/18 0437 03/23/18 0704  BP: 113/63 114/65 126/78   Pulse: 60 (!) 50 83   Resp: 16 18 17    Temp: 98 F (36.7 C) 98.9 F (37.2 C) 98.5 F (36.9 C)   TempSrc: Oral Oral Oral   SpO2: 96% 98% 97%   Weight:   126.4 kg 125.5 kg  Height:        Intake/Output Summary (Last 24 hours) at 03/23/2018 0817 Last data filed at 03/23/2018 0700 Gross per 24 hour  Intake 660 ml  Output 1525 ml  Net -865 ml   Filed Weights   03/22/18 0519 03/23/18 0437 03/23/18 0704  Weight: 122.1 kg 126.4 kg 125.5 kg    Examination:  General exam: Appears calm and comfortable  Respiratory system: Clear to auscultation. Respiratory effort normal. Cardiovascular system: S1 & S2 heard, RRR. No JVD, murmurs, rubs, gallops or clicks. No pedal edema. Gastrointestinal system: Abdomen is nondistended, soft and  nontender. No organomegaly or masses felt. Normal bowel sounds heard. Central nervous system: Alert and oriented. No focal neurological deficits. Extremities: Decreased left upper extremity edema decreased lower extremity edema Skin: No rashes, lesions or ulcers Psychiatry: Judgement and insight appear normal. Mood & affect appropriate.     Data Reviewed: I have personally reviewed following labs and imaging studies  CBC: Recent Labs  Lab 03/17/18 0520 03/17/18 1250 03/18/18 0305  03/19/18 0313 03/19/18 1239 03/20/18 0400 03/20/18 1852 03/21/18 0349 03/22/18 0357  WBC 9.3 7.2 7.8   < > 6.7 7.7 7.3  --  6.8 7.9  NEUTROABS 6.8 5.5 6.2  --  5.1  --  5.5  --   --   --   HGB 9.7* 9.3* 8.1*   < > 7.4* 8.1* 7.3* 8.3* 8.0* 8.1*  HCT 28.8* 29.2* 25.3*   < > 23.4* 26.0* 23.2* 26.0* 25.6* 27.2*  MCV 95.7 98.3 99.6   < > 99.2 100.4* 100.0  --  99.6 102.6*  PLT 130* 127* 113*   < > 98* 118* 114*  --  139* 154   < > = values in this interval not displayed.   Basic Metabolic Panel: Recent Labs  Lab 03/17/18 0520 03/18/18 0305 03/19/18 0313 03/20/18 0400 03/21/18 0349 03/22/18 0357  NA  --  138 139 139 138 138  K  --  3.9 3.6 3.5 3.6 3.7  CL  --  108 107 107 104 105  CO2  --  27 27 26 27 28   GLUCOSE  --  137* 135* 126* 129* 115*  BUN  --  17 15 16 20  21*  CREATININE  --  0.68 0.60* 0.54* 0.57* 0.65  CALCIUM  --  8.0* 8.1* 8.3* 8.3* 8.1*  MG 1.6* 2.2 1.9 2.1  --   --   PHOS 2.9 2.4* 2.1* 2.9  --   --    GFR: Estimated Creatinine Clearance: 169.9 mL/min (by C-G formula based on SCr of 0.65 mg/dL). Liver Function Tests: Recent Labs  Lab 03/16/18 2045 03/17/18 0520 03/21/18 0349  AST 13* 17 15  ALT 17 16 19   ALKPHOS 40 35* 46  BILITOT 3.0* 1.9* 1.5*  PROT 5.7* 5.0* 5.5*  ALBUMIN 3.9 3.2* 3.2*   No results for input(s): LIPASE, AMYLASE in the last 168 hours. No results for input(s): AMMONIA in the last 168 hours. Coagulation Profile: Recent Labs  Lab 03/16/18 2045  03/17/18 0520  INR 1.2 1.3*   Cardiac Enzymes: Recent Labs  Lab 03/16/18 2045 03/17/18 0520  TROPONINI <0.03 <0.03   BNP (last 3 results) No results for input(s): PROBNP in the last 8760 hours. HbA1C: No results for input(s): HGBA1C in the last 72 hours. CBG: Recent Labs  Lab 03/22/18 0751 03/22/18 1149 03/22/18 1642 03/23/18 0434 03/23/18 0744  GLUCAP 100* 107* 109* 100* 97   Lipid Profile: No results for input(s): CHOL, HDL, LDLCALC, TRIG, CHOLHDL, LDLDIRECT in the last 72 hours. Thyroid Function Tests: No results for input(s): TSH, T4TOTAL, FREET4, T3FREE, THYROIDAB in the last 72 hours. Anemia Panel: No results for input(s): VITAMINB12, FOLATE,  FERRITIN, TIBC, IRON, RETICCTPCT in the last 72 hours. Sepsis Labs: Recent Labs  Lab 03/16/18 2045 03/17/18 0555  LATICACIDVEN 1.0 1.5    No results found for this or any previous visit (from the past 240 hour(s)).       Radiology Studies: No results found.      Scheduled Meds: . aspirin  81 mg Oral BID  . atorvastatin  40 mg Oral q1800  . bisacodyl  10 mg Oral Daily  . Chlorhexidine Gluconate Cloth  6 each Topical Daily  . docusate sodium  200 mg Oral BID  . ferrous sulfate  325 mg Oral BID WC  . gabapentin  300 mg Oral TID  . hydrocortisone sod succinate (SOLU-CORTEF) inj  50 mg Intravenous Daily  . insulin aspart  1-3 Units Subcutaneous Q4H  . levothyroxine  200 mcg Oral Daily  . mouth rinse  15 mL Mouth Rinse BID  . polyethylene glycol  17 g Oral Daily   Continuous Infusions: . methocarbamol (ROBAXIN) IV       LOS: 7 days     Georgette Shell, MD [facility]  If 7PM-7AM, please contact night-coverage [email or web address] Password [facility] 03/23/2018, 8:17 AM

## 2018-03-23 NOTE — Progress Notes (Signed)
PT Cancellation Note  Patient Details Name: Mark Gallagher MRN: [id number] DOB: 05/10/1970   Cancelled Treatment:    Reason Eval/Treat Not Completed: Fatigue/lethargy limiting ability to participate(pt in bed on bedpan currently having diarrhea. He declined PT visit, stated he's tired from sleeping poorly and dealing with frequent BMs. Pt stated he feels comfortable with the stair training we've done and that he can manage mobility at home with family assisting). Pt has THA HEP handout, encouraged pt to perform exercises BID at home.    [name] PT 03/23/2018  Acute Rehabilitation Services Pager [phone] Office [phone]

## 2018-03-24 ENCOUNTER — Telehealth: Payer: Self-pay | Admitting: Family Medicine

## 2018-03-24 NOTE — Telephone Encounter (Signed)
Copied from [place] [id number]. Topic: General - Other >> Mar 24, 2018 12:12 PM Leward Quan A wrote: Reason for CRM: Patient called to say that he just got home on 03/23/2018 from hospital after right hip replacement. He would like to know if Dr Carlota Raspberry need to see him please call him to schedule the appointment. Ph# [phone]  Patient is also requesting a call back from [facility] regarding him coming in for his Testosterone shot on 03/26/2018. Asking for a call back either later on today or tomorrow.

## 2018-03-26 ENCOUNTER — Ambulatory Visit: Payer: BLUE CROSS/BLUE SHIELD | Admitting: Family Medicine

## 2018-03-26 ENCOUNTER — Encounter: Payer: Self-pay | Admitting: Family Medicine

## 2018-03-26 ENCOUNTER — Emergency Department (HOSPITAL_COMMUNITY): Payer: BLUE CROSS/BLUE SHIELD

## 2018-03-26 ENCOUNTER — Other Ambulatory Visit: Payer: Self-pay

## 2018-03-26 ENCOUNTER — Encounter (HOSPITAL_COMMUNITY): Payer: Self-pay | Admitting: Emergency Medicine

## 2018-03-26 ENCOUNTER — Inpatient Hospital Stay (HOSPITAL_COMMUNITY)
Admission: EM | Admit: 2018-03-26 | Discharge: 2018-04-01 | DRG: 641 | Disposition: A | Discharge status: Home / Self Care | Payer: BLUE CROSS/BLUE SHIELD | Attending: Internal Medicine | Admitting: Internal Medicine

## 2018-03-26 VITALS — BP 112/78 | HR 106 | Temp 98.0°F | Resp 18 | Wt 285.0 lb

## 2018-03-26 DIAGNOSIS — R140 Abdominal distension (gaseous): Secondary | ICD-10-CM | POA: Diagnosis not present

## 2018-03-26 DIAGNOSIS — I428 Other cardiomyopathies: Secondary | ICD-10-CM | POA: Diagnosis not present

## 2018-03-26 DIAGNOSIS — Q2739 Arteriovenous malformation, other site: Secondary | ICD-10-CM | POA: Diagnosis not present

## 2018-03-26 DIAGNOSIS — R635 Abnormal weight gain: Secondary | ICD-10-CM

## 2018-03-26 DIAGNOSIS — I770 Arteriovenous fistula, acquired: Secondary | ICD-10-CM | POA: Diagnosis not present

## 2018-03-26 DIAGNOSIS — Z806 Family history of leukemia: Secondary | ICD-10-CM

## 2018-03-26 DIAGNOSIS — Z923 Personal history of irradiation: Secondary | ICD-10-CM

## 2018-03-26 DIAGNOSIS — I4892 Unspecified atrial flutter: Secondary | ICD-10-CM | POA: Diagnosis present

## 2018-03-26 DIAGNOSIS — E291 Testicular hypofunction: Secondary | ICD-10-CM

## 2018-03-26 DIAGNOSIS — E2740 Unspecified adrenocortical insufficiency: Secondary | ICD-10-CM | POA: Diagnosis not present

## 2018-03-26 DIAGNOSIS — R601 Generalized edema: Secondary | ICD-10-CM | POA: Diagnosis not present

## 2018-03-26 DIAGNOSIS — I340 Nonrheumatic mitral (valve) insufficiency: Secondary | ICD-10-CM | POA: Diagnosis not present

## 2018-03-26 DIAGNOSIS — R17 Unspecified jaundice: Secondary | ICD-10-CM

## 2018-03-26 DIAGNOSIS — Z96641 Presence of right artificial hip joint: Secondary | ICD-10-CM | POA: Diagnosis present

## 2018-03-26 DIAGNOSIS — E876 Hypokalemia: Secondary | ICD-10-CM | POA: Diagnosis not present

## 2018-03-26 DIAGNOSIS — E871 Hypo-osmolality and hyponatremia: Principal | ICD-10-CM

## 2018-03-26 DIAGNOSIS — I313 Pericardial effusion (noninflammatory): Secondary | ICD-10-CM | POA: Diagnosis not present

## 2018-03-26 DIAGNOSIS — Z981 Arthrodesis status: Secondary | ICD-10-CM

## 2018-03-26 DIAGNOSIS — Q2730 Arteriovenous malformation, site unspecified: Secondary | ICD-10-CM | POA: Diagnosis not present

## 2018-03-26 DIAGNOSIS — R609 Edema, unspecified: Secondary | ICD-10-CM | POA: Diagnosis not present

## 2018-03-26 DIAGNOSIS — E230 Hypopituitarism: Secondary | ICD-10-CM | POA: Diagnosis not present

## 2018-03-26 DIAGNOSIS — H5461 Unqualified visual loss, right eye, normal vision left eye: Secondary | ICD-10-CM | POA: Diagnosis not present

## 2018-03-26 DIAGNOSIS — R197 Diarrhea, unspecified: Secondary | ICD-10-CM | POA: Diagnosis not present

## 2018-03-26 DIAGNOSIS — E8809 Other disorders of plasma-protein metabolism, not elsewhere classified: Secondary | ICD-10-CM | POA: Diagnosis present

## 2018-03-26 DIAGNOSIS — K5900 Constipation, unspecified: Secondary | ICD-10-CM | POA: Diagnosis present

## 2018-03-26 DIAGNOSIS — I2510 Atherosclerotic heart disease of native coronary artery without angina pectoris: Secondary | ICD-10-CM | POA: Diagnosis not present

## 2018-03-26 DIAGNOSIS — R748 Abnormal levels of other serum enzymes: Secondary | ICD-10-CM

## 2018-03-26 DIAGNOSIS — Z7982 Long term (current) use of aspirin: Secondary | ICD-10-CM | POA: Diagnosis not present

## 2018-03-26 DIAGNOSIS — E220 Acromegaly and pituitary gigantism: Secondary | ICD-10-CM | POA: Diagnosis not present

## 2018-03-26 DIAGNOSIS — Z79890 Hormone replacement therapy (postmenopausal): Secondary | ICD-10-CM

## 2018-03-26 DIAGNOSIS — I10 Essential (primary) hypertension: Secondary | ICD-10-CM | POA: Diagnosis present

## 2018-03-26 DIAGNOSIS — I77810 Thoracic aortic ectasia: Secondary | ICD-10-CM | POA: Diagnosis present

## 2018-03-26 DIAGNOSIS — D62 Acute posthemorrhagic anemia: Secondary | ICD-10-CM | POA: Diagnosis not present

## 2018-03-26 DIAGNOSIS — M4712 Other spondylosis with myelopathy, cervical region: Secondary | ICD-10-CM | POA: Diagnosis not present

## 2018-03-26 DIAGNOSIS — R000 Tachycardia, unspecified: Secondary | ICD-10-CM

## 2018-03-26 DIAGNOSIS — Z8701 Personal history of pneumonia (recurrent): Secondary | ICD-10-CM

## 2018-03-26 DIAGNOSIS — Z79899 Other long term (current) drug therapy: Secondary | ICD-10-CM

## 2018-03-26 DIAGNOSIS — M4802 Spinal stenosis, cervical region: Secondary | ICD-10-CM | POA: Diagnosis present

## 2018-03-26 DIAGNOSIS — E039 Hypothyroidism, unspecified: Secondary | ICD-10-CM | POA: Diagnosis not present

## 2018-03-26 DIAGNOSIS — I361 Nonrheumatic tricuspid (valve) insufficiency: Secondary | ICD-10-CM | POA: Diagnosis not present

## 2018-03-26 DIAGNOSIS — Z885 Allergy status to narcotic agent status: Secondary | ICD-10-CM

## 2018-03-26 DIAGNOSIS — Q2734 Arteriovenous malformation of renal vessel: Secondary | ICD-10-CM | POA: Diagnosis not present

## 2018-03-26 DIAGNOSIS — M1990 Unspecified osteoarthritis, unspecified site: Secondary | ICD-10-CM | POA: Diagnosis present

## 2018-03-26 DIAGNOSIS — E860 Dehydration: Secondary | ICD-10-CM | POA: Diagnosis present

## 2018-03-26 DIAGNOSIS — R5383 Other fatigue: Secondary | ICD-10-CM

## 2018-03-26 DIAGNOSIS — J810 Acute pulmonary edema: Secondary | ICD-10-CM | POA: Diagnosis not present

## 2018-03-26 LAB — CBC WITH DIFFERENTIAL/PLATELET
Abs Immature Granulocytes: 0.08 10*3/uL — ABNORMAL HIGH (ref 0.00–0.07)
Basophils Absolute: 0 10*3/uL (ref 0.0–0.1)
Basophils Relative: 0 %
Eosinophils Absolute: 0.1 10*3/uL (ref 0.0–0.5)
Eosinophils Relative: 1 %
HCT: 32.9 % — ABNORMAL LOW (ref 39.0–52.0)
Hemoglobin: 10.1 g/dL — ABNORMAL LOW (ref 13.0–17.0)
Immature Granulocytes: 1 %
Lymphocytes Relative: 8 %
Lymphs Abs: 0.9 10*3/uL (ref 0.7–4.0)
MCH: 31.2 pg (ref 26.0–34.0)
MCHC: 30.7 g/dL (ref 30.0–36.0)
MCV: 101.5 fL — ABNORMAL HIGH (ref 80.0–100.0)
Monocytes Absolute: 1.1 10*3/uL — ABNORMAL HIGH (ref 0.1–1.0)
Monocytes Relative: 10 %
Neutro Abs: 8.2 10*3/uL — ABNORMAL HIGH (ref 1.7–7.7)
Neutrophils Relative %: 80 %
Platelets: 300 10*3/uL (ref 150–400)
RBC: 3.24 MIL/uL — ABNORMAL LOW (ref 4.22–5.81)
RDW: 15.4 % (ref 11.5–15.5)
WBC: 10.4 10*3/uL (ref 4.0–10.5)
nRBC: 0 % (ref 0.0–0.2)

## 2018-03-26 LAB — COMPREHENSIVE METABOLIC PANEL
ALT: 24 U/L (ref 0–44)
AST: 22 U/L (ref 15–41)
Albumin: 3 g/dL — ABNORMAL LOW (ref 3.5–5.0)
Alkaline Phosphatase: 153 U/L — ABNORMAL HIGH (ref 38–126)
Anion gap: 10 (ref 5–15)
BUN: 22 mg/dL — ABNORMAL HIGH (ref 6–20)
CO2: 25 mmol/L (ref 22–32)
Calcium: 8.2 mg/dL — ABNORMAL LOW (ref 8.9–10.3)
Chloride: 93 mmol/L — ABNORMAL LOW (ref 98–111)
Creatinine, Ser: 0.75 mg/dL (ref 0.61–1.24)
GFR calc Af Amer: >60 mL/min (ref >60)
GFR calc non Af Amer: >60 mL/min (ref >60)
Glucose, Bld: 113 mg/dL — ABNORMAL HIGH (ref 70–99)
Potassium: 3.6 mmol/L (ref 3.5–5.1)
Sodium: 128 mmol/L — ABNORMAL LOW (ref 135–145)
Total Bilirubin: 3.4 mg/dL — ABNORMAL HIGH (ref 0.3–1.2)
Total Protein: 5.6 g/dL — ABNORMAL LOW (ref 6.5–8.1)

## 2018-03-26 LAB — POCT CBC
Granulocyte percent: 82.7 %G — AB (ref 37–80)
HCT, POC: 30.4 % (ref 29–41)
Hemoglobin: 10.2 g/dL — AB (ref 11–14.6)
Lymph, poc: 1.1 (ref 0.6–3.4)
MCH, POC: 32.1 pg — AB (ref 27–31.2)
MCHC: 33.6 g/dL (ref 31.8–35.4)
MCV: 95.5 fL (ref 76–111)
MID (cbc): 0.9 (ref 0–0.9)
MPV: 7.5 fL (ref 0–99.8)
POC Granulocyte: 9.7 — AB (ref 2–6.9)
POC LYMPH PERCENT: 9.3 %L — AB (ref 10–50)
POC MID %: 8 %M (ref 0–12)
Platelet Count, POC: 334 10*3/uL (ref 142–424)
RBC: 3.18 M/uL — AB (ref 4.69–6.13)
RDW, POC: 16.2 %
WBC: 11.7 10*3/uL — AB (ref 4.6–10.2)

## 2018-03-26 LAB — LIPASE, BLOOD: Lipase: 24 U/L (ref 11–51)

## 2018-03-26 LAB — SODIUM, URINE, RANDOM: Sodium, Ur: <10 mmol/L

## 2018-03-26 LAB — BRAIN NATRIURETIC PEPTIDE: B Natriuretic Peptide: 105.5 pg/mL — ABNORMAL HIGH (ref 0.0–100.0)

## 2018-03-26 MED ORDER — SODIUM CHLORIDE 0.9 % IV BOLUS: 1000.0000 mL | Freq: Once | INTRAVENOUS | Status: DC

## 2018-03-26 MED ORDER — TESTOSTERONE CYPIONATE 200 MG/ML IM SOLN
100.0000 mg | Freq: Once | INTRAMUSCULAR | Status: AC
  Administered 2018-03-26: 100 mg via INTRAMUSCULAR

## 2018-03-26 MED ORDER — IOPAMIDOL (ISOVUE-300) INJECTION 61%
100.0000 mL | Freq: Once | INTRAVENOUS | Status: AC | PRN
  Administered 2018-03-26: 100 mL via INTRAVENOUS

## 2018-03-26 MED ORDER — HYDROCORTISONE NA SUCCINATE PF 100 MG IJ SOLR
100.0000 mg | Freq: Once | INTRAMUSCULAR | Status: AC
  Administered 2018-03-26: 100 mg via INTRAVENOUS
  Filled 2018-03-26: qty 2

## 2018-03-26 MED ORDER — SODIUM CHLORIDE (PF) 0.9 % IJ SOLN
INTRAMUSCULAR | Status: AC
  Filled 2018-03-26: qty 50

## 2018-03-26 MED ORDER — IOPAMIDOL (ISOVUE-300) INJECTION 61%
INTRAVENOUS | Status: AC
  Filled 2018-03-26: qty 100

## 2018-03-26 MED ORDER — IOHEXOL 300 MG/ML  SOLN: 30.0000 mL | Freq: Once | INTRAMUSCULAR | Status: DC | PRN

## 2018-03-26 NOTE — Progress Notes (Signed)
Subjective:    Patient ID: Mark Gallagher, male    DOB: 30-Jul-1970, 48 y.o.   MRN: [id number]  HPI Mark Gallagher is a 48 y.o. male Presents today for: Chief Complaint  Patient presents with  . Post-op Problem    Had surgery on 03/16/18. Having a problem with swelling/fluid both leg and scrotum area and started with nausea which has been taking zofran that the hospital gave me. The Discharge papers stated need to check with pcp to as when need to go back on losartan and metoprolol. Patient stated he still feels weak he was given 3 units of blood in hospital. They stated hemoglobin kept dropping would like to have hemoglobin rechecked today as well. Also need a order to get sized for compression stockings   Here for hospital follow-up and concerns as above.  History of acromegaly, prior pituitary tumor and panhypopituitarism, followed by endocrinology.  Additionally has been followed by cardiology at [facility] cardiology.  Admitted from February 25 through March 3 at [facility] right total hip arthroplasty February 25th.  Did receive preoperative stress dose steroids, 1100 mL blood loss but had significant hypotension requiring Neo-Synephrine after surgery thought to be a combination of blood loss and and possible stress to the adrenals.  He was continued on stress dose steroids, followed by critical care medicine.  Required blood transfusion 2 units packed red blood cells as well as albumin., Neo-Synephrine.  Initially was continued on Solu-Cortef 50 mg every 6 hours, then weaning of steroids February 29 through March 2.  Metoprolol and Cozaar was held given prior hypotension.  Resumed eplerenone.  Discharged 3 days ago.  Telephone notes reviewed.  Plan for follow-up yesterday with cardiology to discuss restarting medications but he was nauseated.  He was taking Zofran as needed but some concern from family member regarding weight gain/fluid gain.  Reportedly 30 pounds over his dry  weight.  Feels weak, minimal shortness of breath. No chest pains. No fever. Has not restarted metoprolol and losartan. appt with cardiology in 4 days, then ortho in 5 days.  Has had bilaterral leg swelling, and scrotal swelling. Noted in hospital as well. Negative LE doppler for DVT. Required lasix 20mg  once on 3/2 for swelling - had good UOP per notes. HGB up to 8 on 3/3 after 1 unit PRBC.  Urinating a little less than usual - darker urine.  Bili 1.5 on 03/21/18.  Albumin 3.2 on 03/21/18.  Swelling of left forearm - noted in hospital. Had negative ultrasound for DVT. Better, but still forearm swelling.  Leg swelling is worse.    Wt Readings from Last 3 Encounters:  03/26/18 285 lb (129.3 kg)  03/23/18 276 lb 9.6 oz (125.5 kg)  03/09/18 242 lb (109.8 kg)   Good day Tuesday. Had enema 3 days ago. BM in hospital, but none in past 3 days. Started stool softener and miralax yesterday.  Felt ok 2 days ago. Yesterday had more stomach rumbling. Vomited once yesterday - no blood, but large amount - liquid. Drinking some fluids - water, jello, ginger ale. Some crackers and grits yeterday. Last vomited yesterday at 3pm. Min toast this am and jello - kept food down. Taking zofran every 8 hours for nausea.  No fever.  On hydrocortisone 20mg  QD. Option of additional 10mg  if more tired.   Lab Results  Component Value Date   WBC 7.9 03/22/2018   HGB 8.1 (L) 03/22/2018   HCT 27.2 (L) 03/22/2018  MCV 102.6 (H) 03/22/2018   PLT 154 03/22/2018   Lab Results  Component Value Date   WBC 11.7 (A) 03/26/2018   HGB 10.2 (A) 03/26/2018   HCT 30.4 03/26/2018   MCV 95.5 03/26/2018   PLT 154 03/22/2018    Review of Systems As above in HPI.      Objective:   Physical Exam Vitals signs reviewed.  Constitutional:      Appearance: He is well-developed.  HENT:     Head: Normocephalic and atraumatic.  Eyes:     Pupils: Pupils are equal, round, and reactive to light.  Neck:     Vascular: No carotid  bruit or JVD.  Cardiovascular:     Rate and Rhythm: Regular rhythm. Tachycardia present.     Heart sounds: Murmur present.  Pulmonary:     Effort: Pulmonary effort is normal.     Breath sounds: Normal breath sounds. No wheezing or rales.  Abdominal:     General: There is no distension.  Genitourinary:    Comments: There is of slight scrotal swelling but not tight skin.  Nontender. Musculoskeletal:     Right lower leg: Edema (Tight edema bilateral lower extremities including to the thighs.  Skin intact.) present.     Left lower leg: Edema present.  Skin:    General: Skin is warm and dry.     Coloration: Skin is jaundiced (pale skin of face with slight jaundice, slight scleral icterus of eyes.).  Neurological:     Mental Status: He is alert and oriented to person, place, and time.    Vitals:   03/26/18 1143 03/26/18 1203  BP: 112/78   Pulse: (!) 112 (!) 106  Resp: 18   Temp: 98 F (36.7 C)   TempSrc: Oral   SpO2: 100%   Weight: 285 lb (129.3 kg)      Over 40 minutes care with repeat eval and over 21mins chart review.     Assessment & Plan:   Mark Gallagher is a 48 y.o. male Peripheral edema  Acute blood loss anemia - Plan: POCT CBC  Hypogonadism in male - Plan: testosterone cypionate (DEPOTESTOSTERONE CYPIONATE) injection 100 mg  Scleral icterus  Abnormal weight gain  Tachycardia  Fatigue, unspecified type  Status post surgery as above requiring hospitalization after hypotension.  Received multiple transfusion due to anemia.  Now with progressive weight gain, increasing lower extremity swelling bilaterally, appears to have scleral icterus and possible jaundice.  Increasing fatigue yesterday, slightly improved today.  Improved hemoglobin.  Still borderline blood pressure, tachycardic.  Concern for third spacing of fluid and worsening edema per patient, weight has increased as above.  -Further evaluation through emergency room today for possible repeat admission,  evaluation for persisting weight gain and fluid retention, evaluation of liver function.  Sent to [facility], ER by private vehicle.  Meds ordered this encounter  Medications  . testosterone cypionate (DEPOTESTOSTERONE CYPIONATE) injection 100 mg

## 2018-03-26 NOTE — H&P (Signed)
History and Physical    Mark Gallagher BHA:[id number] DOB: 02-06-1970 DOA: 03/26/2018  PCP: Wendie Agreste, MD  Patient coming from: home by way of urgent care   Chief Complaint: swelling, fatigue, abd distention, vomiting  HPI: Mark Gallagher is a 48 y.o. 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  Recently hospitalized for right hip repair, discharged 3 days ago. Hospital stay complicated by hypotension, treated for acute blood loss anemia and possibly adrenal insufficiency. Required ICU and pressors. Also found to have edema of lower extremities, scrotum, and upper extremities, evaluated with upper and lower peripheral dopplers which were negative for dvt. Also treated for constipation.  Patient reports significant weight gain over course of hospital stay, gaining up to 40 pounds.   Says felt well the day after discharge, but beginning yesterday began to feel fatigued and run down. Felt abdomen was distended and uncomfortable, no focal pain. Had one episode of large volume nbnb emesis. Has not had a bowel movement since discharge, says it has been about 5 day since last. Says stomach is gurgling a lot and is passing flatus. Says urine is dark but is urinating frequently. No chest pain, no cough or shortness of breath. No fevers or sick contacts. Says has been compliant with meds.  ED Course: Labs, called to admit, I ordered stress-dose steroids and RUQ u/s given elevated Tbili,   Review of Systems: As per HPI otherwise 10 point review of systems negative.    Past Medical History:  Diagnosis Date  . Acromegaly ([place])   . Arthritis   . Cancer ([place])   .  History of pituitary tumor   . Hypertension   . Hypothyroidism   . Knee pain, bilateral   . Pneumonia   . Testosterone deficiency   . Thyroid disease     Past Surgical History:  Procedure Laterality Date  . CARDIAC ELECTROPHYSIOLOGY STUDY AND ABLATION    . CERVICAL DISC SURGERY  2007   Anterior C4/5 vertebrectomy and c3-C6 laminoplasty with plating  . CRANIOTOMY FOR TUMOR     pituitary adenoma  . TOTAL HIP ARTHROPLASTY Right 03/16/2018   Procedure: TOTAL HIP ARTHROPLASTY Anterior;  Surgeon: Renette Butters, MD;  Location: [facility];  Service: Orthopedics;  Laterality: Right;     reports that he has never smoked. He has never used smokeless tobacco. He reports that he does not drink alcohol or use drugs.  Allergies  Allergen Reactions  . Oxycodone Nausea Only    Family History  Problem Relation Age of Onset  . High blood pressure Mother   . Acute myelogenous leukemia Brother     Prior to Admission medications   Medication Sig Start Date End Date Taking? Authorizing Provider  aspirin EC 81 MG tablet Take 1 tablet (81 mg total) by mouth 2 (two) times daily. For DVT prophylaxis for 30 days after surgery. 03/16/18  Yes Prudencio Burly III, PA-C  atorvastatin (LIPITOR) 40 MG tablet Take 40 mg by mouth daily.   Yes [provider]  Cholecalciferol (VITAMIN D3) 25 MCG (1000 UT) CAPS Take 2,000 Units by mouth daily.    Yes [provider]  docusate sodium (COLACE) 250 MG capsule Take 250 mg by mouth daily.   Yes  [provider]  eplerenone (INSPRA) 50 MG tablet Take 50 mg by mouth daily.   Yes [provider]  ferrous sulfate 325 (65 FE) MG tablet Take 1 tablet (325 mg total) by mouth 2 (two) times daily with a meal. 10/05/14  Yes Love, Ivan Anchors, PA-C  gabapentin (NEURONTIN) 300 MG capsule Take 1 capsule (300 mg total) by mouth 2 (two) times daily for 14 days. For 2 weeks post op for pain. 03/16/18 03/30/18 Yes Martensen, Charna Elizabeth III, PA-C    HYDROcodone-acetaminophen (NORCO) 7.5-325 MG tablet Take 1-2 tablets by mouth every 6 (six) hours as needed for moderate pain.   Yes [provider]  hydrocortisone (CORTEF) 10 MG tablet Take 1 tablet daily.  You may take 2 tablets daily with excessive weakness and tiredness and lethargy. Patient taking differently: Take 10 mg by mouth daily.  03/23/18  Yes Georgette Shell, MD  levothyroxine (SYNTHROID, LEVOTHROID) 200 MCG tablet Take 1 tablet (200 mcg total) by mouth daily. 10/05/14  Yes Love, Ivan Anchors, PA-C  methocarbamol (ROBAXIN-750) 750 MG tablet Take 1 tablet (750 mg total) by mouth every 6 (six) hours as needed for muscle spasms. 03/16/18  Yes Martensen, Charna Elizabeth III, PA-C  Multiple Vitamins-Minerals (CENTRUM SILVER PO) Take 1 tablet by mouth daily.   Yes [provider]  naproxen sodium (ALEVE) 220 MG tablet Take 220-440 mg by mouth daily as needed (for pain or headache).   Yes [provider]  octreotide (SANDOSTATIN LAR) 30 MG injection Inject 30 mg into the muscle every 28 (twenty-eight) days.    Yes [provider]  ondansetron (ZOFRAN) 4 MG tablet Take 1 tablet (4 mg total) by mouth every 8 (eight) hours as needed for nausea or vomiting. 03/16/18  Yes Martensen, Charna Elizabeth III, PA-C  polyethylene glycol (MIRALAX / GLYCOLAX) packet Take 17 g by mouth daily as needed for mild constipation.   Yes [provider]  simethicone (MYLICON) 80 MG chewable tablet Chew 80 mg by mouth every 6 (six) hours as needed for flatulence.   Yes [provider]  testosterone cypionate (DEPOTESTOSTERONE CYPIONATE) 200 MG/ML injection Inject 1 mL (200 mg total) into the muscle every 14 (fourteen) days. 12/22/15  Yes Wardell Honour, MD  losartan (COZAAR) 25 MG tablet Take 25 mg by mouth daily.    [provider]  metoprolol succinate (TOPROL-XL) 25 MG 24 hr tablet Take 25 mg by mouth daily.    [provider]    Physical  Exam: Vitals:   03/26/18 2015 03/26/18 2100 03/26/18 2215 03/26/18 2300  BP: 103/83 117/89 107/69 117/87  Pulse: 94 (!) 53 83 91  Resp: 17 (!) 21 15 17   Temp:      TempSrc:      SpO2: 96% 98% 95% 94%  Weight:      Height:        Constitutional: No acute distress. Pale Head: acromegalic Eyes: Conjunctiva. Dysconjugate gaze ENM: Moist mucous membranes. Normal dentition.  Neck: Supple Respiratory: Clear to auscultation bilaterally, no wheezing/rales/rhonchi. Normal respiratory effort. No accessory muscle use. . Cardiovascular: Regular rate and rhythm. Soft systolic murmur Abdomen: mild distention, BS in all 4 quadrants, diffuse mild ttp, no rebound/guarding Musculoskeletal: limbs larger than normal Skin: Echmyosis of scrotum Extremities: Edema mildly pit. Neurologic: Alert, moving all 4 extremities. Psychiatric: Normal insight and judgement.   Labs on Admission: I have personally reviewed following labs and imaging studies  CBC: Recent Labs  Lab 03/20/18 0400 03/20/18 1852  03/21/18 0349 03/22/18 0357 03/26/18 1212 03/26/18 1514  WBC 7.3  --  6.8 7.9 11.7* 10.4  NEUTROABS 5.5  --   --   --   --  8.2*  HGB 7.3* 8.3* 8.0* 8.1* 10.2* 10.1*  HCT 23.2* 26.0* 25.6* 27.2* 30.4 32.9*  MCV 100.0  --  99.6 102.6* 95.5 101.5*  PLT 114*  --  139* 154  --  387   Basic Metabolic Panel: Recent Labs  Lab 03/20/18 0400 03/21/18 0349 03/22/18 0357 03/26/18 1514  NA 139 138 138 128*  K 3.5 3.6 3.7 3.6  CL 107 104 105 93*  CO2 26 27 28 25   GLUCOSE 126* 129* 115* 113*  BUN 16 20 21* 22*  CREATININE 0.54* 0.57* 0.65 0.75  CALCIUM 8.3* 8.3* 8.1* 8.2*  MG 2.1  --   --   --   PHOS 2.9  --   --   --    GFR: Estimated Creatinine Clearance: 170.4 mL/min (by C-G formula based on SCr of 0.75 mg/dL). Liver Function Tests: Recent Labs  Lab 03/21/18 0349 03/26/18 1514  AST 15 22  ALT 19 24  ALKPHOS 46 153*  BILITOT 1.5* 3.4*  PROT 5.5* 5.6*  ALBUMIN 3.2* 3.0*   Recent Labs   Lab 03/26/18 1514  LIPASE 24   No results for input(s): AMMONIA in the last 168 hours. Coagulation Profile: No results for input(s): INR, PROTIME in the last 168 hours. Cardiac Enzymes: No results for input(s): CKTOTAL, CKMB, CKMBINDEX, TROPONINI in the last 168 hours. BNP (last 3 results) No results for input(s): PROBNP in the last 8760 hours. HbA1C: No results for input(s): HGBA1C in the last 72 hours. CBG: Recent Labs  Lab 03/22/18 2111 03/23/18 0036 03/23/18 0434 03/23/18 0744 03/23/18 1234  GLUCAP 134* 104* 100* 97 103*   Lipid Profile: No results for input(s): CHOL, HDL, LDLCALC, TRIG, CHOLHDL, LDLDIRECT in the last 72 hours. Thyroid Function Tests: No results for input(s): TSH, T4TOTAL, FREET4, T3FREE, THYROIDAB in the last 72 hours. Anemia Panel: No results for input(s): VITAMINB12, FOLATE, FERRITIN, TIBC, IRON, RETICCTPCT in the last 72 hours. Urine analysis: No results found for: COLORURINE, APPEARANCEUR, LABSPEC, PHURINE, GLUCOSEU, HGBUR, BILIRUBINUR, KETONESUR, PROTEINUR, UROBILINOGEN, NITRITE, LEUKOCYTESUR  Radiological Exams on Admission: Dg Chest 2 View  Result Date: 03/26/2018 CLINICAL DATA:  Lower extremity edema and shortness of breath EXAM: CHEST - 2 VIEW COMPARISON:  None. FINDINGS: There is left basilar atelectasis. Cardiomediastinal contours are normal. No pleural effusion or pneumothorax. No pulmonary edema or focal airspace consolidation. IMPRESSION: Left basilar atelectasis. Electronically Signed   By: Ulyses Jarred M.D.   On: 03/26/2018 16:57   Ct Abdomen Pelvis W Contrast  Result Date: 03/26/2018 CLINICAL DATA:  Postop right hip arthroplasty on 03/16/2018. Patient presents with bilateral leg edema and fluid retention. Soft tissue edema diffusely. EXAM: CT ABDOMEN AND PELVIS WITH CONTRAST TECHNIQUE: Multidetector CT imaging of the abdomen and pelvis was performed using the standard protocol following bolus administration of intravenous contrast.  CONTRAST:  176mL ISOVUE-300 IOPAMIDOL (ISOVUE-300) INJECTION 61% COMPARISON:  Lumbar spine MRI 01/21/2018 FINDINGS: Lower chest: Borderline cardiomegaly without pericardial effusion. Small hiatal hernia. Bibasilar atelectasis left greater than right. Trace bilateral pleural effusions. Hepatobiliary: No focal liver abnormality is seen. No gallstones, gallbladder wall thickening, or biliary dilatation. Pancreas: Unremarkable. No pancreatic ductal dilatation or surrounding inflammatory changes. Spleen: Normal in size without focal abnormality. Adrenals/Urinary Tract: Normal bilateral adrenal glands. No nephrolithiasis is identified no obstructive uropathy or enhancing renal mass.  The urinary bladder is physiologically distended without focal mural thickening or calculi. Stomach/Bowel: There is a small hiatal hernia. Contrast is noted within the stomach without significant distention. The duodenal bulb, sweep and ligament of Treitz are normal. No significant small bowel dilatation is seen. Moderate to large amount of retained stool is seen within the right colon with gaseous distention of the transverse and proximal descending colon. No mechanical large bowel obstruction is noted. Vascular/Lymphatic: There is an unusual appearance of the left renal vein which is slightly hyperdense and dilated in appearance up to 3.2 cm AP. Similarly the left renal artery is prominent and dilated up to 1 cm in diameter. There are coarse calcifications noted in the left renal hilum and the possibility of an renal AV fistula or malformation accounting for this appearance is raised. The left renal vein is circumaortic. The right renal vein and artery are unremarkable. The IVC is flat and nondistended. No lymphadenopathy. Reproductive: Normal size prostate and seminal vesicles. Other: Diffuse subcutaneous soft tissue edema consistent with anasarca is noted of the body wall and extending into the thighs. Moderate free fluid in the pelvis.  Musculoskeletal: Susceptibility artifacts from the patient's right hip arthroplasty with postop change accounting for a few dots of air along the anterior aspect of the thigh are identified. Faint hyperdensities noted within the thigh musculature are noted on series 2/119 felt secondary to streak artifacts and less likely due to hemorrhage. No hyperdense fluid is seen within the musculature or significant hematoma. Chronic anterior compression of T12. Multilevel thoracolumbar degenerative disc disease. IMPRESSION: 1. Unusually dilated left renal vein and left renal artery with dystrophic calcifications in the left renal hilum may represent the presence of a renal AV fistula or malformation. Conceivably this could contribute to heart failure and contribute to the development of soft tissue edema/anasarca and free fluid within the pelvis. Suggest CT angiogram of the abdomen to rule out a left renal AVF. 2. Postop change of the right thigh status post hip arthroplasty with postop soft tissue tiny foci of emphysema and edema. 3. Trace bilateral pleural effusions with atelectasis. Borderline cardiomegaly. Electronically Signed   By: Ashley Royalty M.D.   On: 03/26/2018 22:20    EKG: Independently reviewed. Widened qrs, rbbb, frequent pvcs. Appears stable from prior  Assessment/Plan Principal Problem:   Hyponatremia Active Problems:   Acromegaly ([place])   Hypogonadism male   Cervical spondylosis with myelopathy   Hypothyroidism   Chronic adrenal insufficiency ([facility])   Anasarca   Hyperbilirubinemia   # Dilated left renal vein and renal artery - radiology read says suspicious for AV fistula or malformation, and pt report of very dark urine also concerning. radiology advising CT angiogram of abdomen to further evaluation. Potential cause/contributor of edema and hypotension - holding on CT angiogram right now given recent contrast load, likely obtain in morning - hole acei, nsaids  # hyponatremia - normal  sodium (138) 4 days ago, today 128. Above disorder could be cause. Acute adrenal insufficiency also possible. Very edematous. chf is possible, if so favor right heart failure given no signs of pulmonary edema on cxr; bnp is only mildly elevated to 105. No hx liver disease and normal appearing liver on u/s. Other than fatigue, not particularly symptomatic, and thus will hold on acute treatment - stress dose steroids as below - echocardiogram - urine sodium, urine osm, serum osm, tsh - hold home eplerenone  # Adrenal insufficiency - treated for acute adrenal insufficiency. Here bp wnl, hyponatremic. Pt  does have fatigue and abdominal symptoms (ct neg for GI pathology). Think prudent to start stress-dose steroids - s/p hydrocortisone 100 mg IV, now ordered for 50 IV q6 - hold home cortef  # Edema - essentially anasarcic. Pt reports 40 or so pound weight gain. Upper and lower extremity venous dopplers neg last week for dvt. CHF is possible; pt does have sig heart history of non-ischemic cardiomyopathy with ef of 50% in 2018. Normal appearing liver on ct; transaminases normal but tbili elevated (see below) - tsh - urinalysis - urine protein  # hyperbilirubinemia - tbili 3.4. transaminases wnl. Normal appearing liver and gallbladder on CT. Etiology unclear at this time - f/u fractionated bilirubin, inr, hepatitis panel - repeat cmp - consider ruq u/s if persistent or worsening  # Anemia - 10.1, improving from hospitalization where suffered acute blood loss anemia - ctm  # hypothyroidism - cont home levo; f/u tsh  # acromegaly - home octreotide dosed q 28 days  # hypogonadism - home testosterone dosed q 14 days, says had last dose this morning     DVT prophylaxis: lovenox Code Status: full  Family Communication: mother [email or web address] cerny [phone]  Disposition Plan: tbd  Consults called: none  Admission status: med/surg    Desma Maxim MD [facility] Pager [phone]  If  7PM-7AM, please contact night-coverage [email or web address] Password [facility]  03/26/2018, 11:56 PM

## 2018-03-26 NOTE — ED Notes (Signed)
Patient transported to X-ray 

## 2018-03-26 NOTE — ED Triage Notes (Signed)
Pt with bilateral leg edema and fluid retention. Pt states he weighed 241 on 03/15/2018, 276 on 3/3/202 and 286 today. Pt states legs are painful and tight and difficulty ambulating with so much fluid on lower extremities. Pt states his L arm is also edematous and pt was seen at [facility] urgent care today and they drew blood.

## 2018-03-26 NOTE — ED Provider Notes (Signed)
[facility] DEPT Provider Note   CSN: [id number] Arrival date & time: 03/26/18  1418    History   Chief Complaint Chief Complaint  Patient presents with  . Leg Swelling    HPI Mark Gallagher is a 48 y.o. 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  States that he has been ambulating slowly since his discharge from the hospital.  He noted that he had progressive worsening swelling of bilateral lower extremities and left arm.  Endorses decreased appetite and PO intake. He had bilateral lower extremity and upper extremity ultrasounds to rule out DVT during his hospitalization which were negative.  States that he is also having some shortness of breath "from carrying all this extra weight."  When he was discharged from his hospitalization, his weight was 241 pounds.  He went to the urgent care today and found that his weight was 286 pounds.  He was told to come to the ED for this worsening swelling, possible jaundice.  He denies any pain currently.  He has had issues with fluid retention in the past which gradually improved ?when he was treated for pneumonia. Denies chest pain, abdominal pain, vomiting.  He has not had a bowel movement for the past 4 days but has been passing gas. Has been taking his home medications as previously prescribed since surgery. Was told during his hospitalization that his fluid would improve over time. He believes his last cardiac echo was done Oct 2019. He is followed by cardiology at [facility]. Denies alcohol use.     HPI  Past Medical History:  Diagnosis Date  . Acromegaly ([place])   . Arthritis   . Cancer ([place])   . History of pituitary tumor   . Hypertension    . Hypothyroidism   . Knee pain, bilateral   . Pneumonia   . Testosterone deficiency   . Thyroid disease     Patient Active Problem List   Diagnosis Date Noted  . 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 Primary localized osteoarthritis of hip 03/16/2018  . S/P total hip arthroplasty 03/16/2018  . Hypothyroidism 02/22/2018  . History of fusion of cervical spine 02/22/2018  . Iron deficiency anemia 02/22/2018  . Acquired left foot drop 02/22/2018  . Primary osteoarthritis of right hip 02/22/2018  . AVNRT (AV nodal re-entry tachycardia) ([place]) 02/22/2018  . Constipation 12/15/2016  . Traumatic closed fracture of distal clavicle with minimal displacement, left, with routine healing, subsequent encounter 03/24/2016  . Left knee pain 09/25/2015  . Adjustment disorder with depressed mood   . Tetraplegia ([place]) 09/19/2014  . Acute blood loss anemia 09/19/2014  . Cervical spondylosis with myelopathy 09/18/2014  . Radiculopathy of arm 08/28/2014  . Hypogonadism male 09/08/2013  . Acromegaly ([place]) 04/01/2011  . Cardiomyopathy 04/01/2011  . Wolff-Parkinson-White (WPW) syndrome 04/01/2011    Past Surgical History:  Procedure Laterality Date  . CARDIAC ELECTROPHYSIOLOGY STUDY AND ABLATION    . CERVICAL DISC SURGERY  2007   Anterior C4/5 vertebrectomy and c3-C6 laminoplasty with plating  . CRANIOTOMY FOR TUMOR     pituitary adenoma  . TOTAL HIP ARTHROPLASTY Right 03/16/2018   Procedure: TOTAL HIP ARTHROPLASTY Anterior;  Surgeon: Renette Butters, MD;  Location: [facility] ORS;  Service: Orthopedics;  Laterality: Right;        Home Medications    Prior to Admission medications   Medication Sig Start Date End Date Taking? Authorizing Provider  aspirin EC 81 MG tablet Take 1 tablet (81 mg total) by mouth 2 (two) times daily. For DVT prophylaxis for 30 days after surgery. 03/16/18  Yes Prudencio Burly III, PA-C  atorvastatin (LIPITOR) 40 MG tablet Take 40 mg by mouth daily.   Yes [provider]  Cholecalciferol (VITAMIN D3) 25 MCG (1000 UT) CAPS Take 2,000 Units by mouth daily.    Yes [provider]  docusate sodium (COLACE) 250 MG capsule Take 250 mg by mouth daily.   Yes [provider]  eplerenone (INSPRA) 50 MG tablet Take 50 mg by mouth daily.   Yes [provider]  ferrous sulfate 325 (65 FE) MG tablet Take 1 tablet (325 mg total) by mouth 2 (two) times daily with a meal. 10/05/14  Yes Love, Ivan Anchors, PA-C  gabapentin (NEURONTIN) 300 MG capsule Take 1 capsule (300 mg total) by mouth 2 (two) times daily for 14 days. For 2 weeks post op for pain. 03/16/18 03/30/18 Yes Martensen, Charna Elizabeth III, PA-C  HYDROcodone-acetaminophen (NORCO) 7.5-325 MG tablet Take 1-2 tablets by mouth every 6 (six) hours as needed for moderate pain.   Yes [provider]  hydrocortisone (CORTEF) 10 MG tablet Take 1 tablet daily.  You may take 2 tablets daily with excessive weakness and tiredness and lethargy. Patient taking differently: Take 10 mg by mouth daily.  03/23/18  Yes Georgette Shell, MD  levothyroxine (SYNTHROID, LEVOTHROID) 200 MCG tablet Take 1 tablet (200 mcg total) by mouth daily. 10/05/14  Yes Love, Ivan Anchors, PA-C  methocarbamol (ROBAXIN-750) 750 MG tablet Take 1 tablet (750 mg total) by mouth every 6 (six) hours as needed for muscle spasms. 03/16/18  Yes Martensen, Charna Elizabeth III, PA-C  Multiple Vitamins-Minerals (CENTRUM SILVER PO) Take 1 tablet by mouth daily.   Yes [provider]  naproxen sodium (ALEVE) 220 MG tablet Take 220-440 mg by mouth daily as needed (for pain or headache).   Yes [provider]  octreotide (SANDOSTATIN LAR) 30 MG injection Inject 30 mg into the muscle every 28 (twenty-eight) days.    Yes [provider]  ondansetron (ZOFRAN) 4 MG tablet Take 1 tablet (4 mg total) by mouth every 8 (eight) hours as needed for nausea or vomiting. 03/16/18  Yes Martensen, Charna Elizabeth III, PA-C  polyethylene  glycol (MIRALAX / GLYCOLAX) packet Take 17 g by mouth daily as needed for mild constipation.   Yes [provider]  simethicone (MYLICON) 80 MG chewable tablet Chew 80 mg by mouth every 6 (six) hours as needed for flatulence.   Yes [provider]  testosterone cypionate (DEPOTESTOSTERONE CYPIONATE) 200 MG/ML injection Inject 1 mL (200 mg total) into the muscle every 14 (fourteen) days. 12/22/15  Yes Wardell Honour, MD  losartan (COZAAR) 25 MG tablet Take 25 mg by mouth daily.    [provider]  metoprolol succinate (TOPROL-XL) 25 MG 24 hr tablet Take 25 mg by mouth daily.    [provider]    Family History Family History  Problem Relation Age of Onset  . High blood pressure Mother   . Acute myelogenous leukemia Brother     Social History Social History   Tobacco Use  . Smoking status: Never Smoker  . Smokeless tobacco: Never Used  Substance Use Topics  .  Alcohol use: No  . Drug use: No     Allergies   Oxycodone   Review of Systems Review of Systems  Constitutional: Negative for appetite change, chills and fever.  HENT: Negative for ear pain, rhinorrhea, sneezing and sore throat.   Eyes: Negative for photophobia and visual disturbance.  Respiratory: Positive for shortness of breath. Negative for cough, chest tightness and wheezing.   Cardiovascular: Positive for leg swelling. Negative for chest pain and palpitations.  Gastrointestinal: Negative for abdominal pain, blood in stool, constipation, diarrhea, nausea and vomiting.  Genitourinary: Negative for dysuria, hematuria and urgency.  Musculoskeletal: Negative for myalgias.  Skin: Negative for rash.  Neurological: Negative for dizziness, weakness and light-headedness.     Physical Exam Updated Vital Signs BP 118/72   Pulse 67   Temp 97.9 F (36.6 C) (Oral)   Resp 15   Ht 6\' 6"  (1.981 m)   Wt 129.7 kg   SpO2 97%   BMI 33.05 kg/m   Physical Exam Vitals signs and nursing  note reviewed.  Constitutional:      General: He is not in acute distress.    Appearance: He is well-developed.  HENT:     Head: Normocephalic and atraumatic.     Nose: Nose normal.  Eyes:     General: Scleral icterus present.        Right eye: No discharge.        Left eye: No discharge.     Conjunctiva/sclera: Conjunctivae normal.  Neck:     Musculoskeletal: Normal range of motion and neck supple.  Cardiovascular:     Rate and Rhythm: Normal rate and regular rhythm.     Heart sounds: Normal heart sounds. No murmur. No friction rub. No gallop.   Pulmonary:     Effort: Pulmonary effort is normal. No respiratory distress.     Breath sounds: Normal breath sounds.  Abdominal:     General: Bowel sounds are normal. There is no distension.     Palpations: Abdomen is soft.     Tenderness: There is no abdominal tenderness. There is no guarding.  Musculoskeletal: Normal range of motion.        General: No deformity.     Right lower leg: Edema present.     Left lower leg: Edema present.     Comments: 1+ pitting edema in bilateral lower extremities. Mild edema noted of LUE.  Skin:    General: Skin is warm and dry.     Findings: No rash.  Neurological:     Mental Status: He is alert.     Motor: No abnormal muscle tone.     Coordination: Coordination normal.      ED Treatments / Results  Labs (all labs ordered are listed, but only abnormal results are displayed) Labs Reviewed  COMPREHENSIVE METABOLIC PANEL - Abnormal; Notable for the following components:      Result Value   Sodium 128 (*)    Chloride 93 (*)    Glucose, Bld 113 (*)    BUN 22 (*)    Calcium 8.2 (*)    Total Protein 5.6 (*)    Albumin 3.0 (*)    Alkaline Phosphatase 153 (*)    Total Bilirubin 3.4 (*)    All other components within normal limits  CBC WITH DIFFERENTIAL/PLATELET - Abnormal; Notable for the following components:   RBC 3.24 (*)    Hemoglobin 10.1 (*)    HCT 32.9 (*)    MCV 101.5 (*)  Neutro  Abs 8.2 (*)    Monocytes Absolute 1.1 (*)    Abs Immature Granulocytes 0.08 (*)    All other components within normal limits  BRAIN NATRIURETIC PEPTIDE - Abnormal; Notable for the following components:   B Natriuretic Peptide 105.5 (*)    All other components within normal limits  LIPASE, BLOOD  HEPATITIS PANEL, ACUTE    EKG EKG Interpretation  Date/Time:  Friday March 26 2018 17:19:42 EST Ventricular Rate:  100 PR Interval:    QRS Duration: 168 QT Interval:  427 QTC Calculation: 551 R Axis:   -39 Text Interpretation:  Sinus tachycardia Multiform ventricular premature complexes IVCD, consider atypical RBBB frequent pvc Otherwise no significant change Confirmed by Deno Etienne [id number]) on 03/26/2018 6:00:54 PM   Radiology Dg Chest 2 View  Result Date: 03/26/2018 CLINICAL DATA:  Lower extremity edema and shortness of breath EXAM: CHEST - 2 VIEW COMPARISON:  None. FINDINGS: There is left basilar atelectasis. Cardiomediastinal contours are normal. No pleural effusion or pneumothorax. No pulmonary edema or focal airspace consolidation. IMPRESSION: Left basilar atelectasis. Electronically Signed   By: Ulyses Jarred M.D.   On: 03/26/2018 16:57    Procedures Procedures (including critical care time)  Medications Ordered in ED Medications - No data to display   Initial Impression / Assessment and Plan / ED Course  I have reviewed the triage vital signs and the nursing notes.  Pertinent labs & imaging results that were available during my care of the patient were reviewed by me and considered in my medical decision making (see chart for details).  Clinical Course as of Mar 26 1823  Fri Mar 26, 2018  1514 Decreased from 138 at hospital discharge on 3/3.  Sodium(!): 128 [HK]  1514 Hemoglobin(!): 10.1 [HK]  1514 Total Bilirubin(!): 3.4 [HK]  1545 B Natriuretic Peptide(!): 105.5 [HK]    Clinical Course User Index [HK] Delia Heady, PA-C       48 year old male with a past medical  history of acromegaly secondary to pituitary tumor, CAD, hypertension, status post total right hip arthroplasty done on 03/16/2018, discharged on 03/23/2018 with a postop course complicated by hypotension acute blood loss anemia resulting in multiple transfusions presents to ED for leg swelling.  He also reports shortness of breath, 40 pound weight gain in the past 2 weeks, decreased appetite and decreased p.o. intake and concern for jaundice.  Patient has been bearing weight as tolerated since the surgery.  He was told that his bilateral lower extremity edema would improve over time.  Negative DVT ultrasounds of all 4 extremities done last week.  On my exam abdomen is soft, nontender nondistended. There is edema of bilateral lower extremities symmetrically without calf tenderness. Some nonpitting edema of LUE. He notes SOB from carrying extra weight. No liver disease that he is aware of. Vital signs are within normal limits. Labwork significant for sodium of 128, T bili of 3.4, BNP of 105.  Chest x-ray with no acute findings.  Patient will need to be admitted for ongoing evaluation and rehydration, which is difficult in this setting of peripheral edema and known chronic illnesses. Hospitalist to admit.    Portions of this note were generated with Lobbyist. Dictation errors may occur despite best attempts at proofreading.   Final Clinical Impressions(s) / ED Diagnoses   Final diagnoses:  Peripheral edema  Hyponatremia  Total bilirubin, elevated  Dehydration    ED Discharge Orders    None  Delia Heady, PA-C 03/26/18 [place], [place], DO 03/26/18 2218

## 2018-03-26 NOTE — ED Notes (Signed)
Kennedy in the lab to add on urine sodium and osmolality

## 2018-03-26 NOTE — ED Notes (Signed)
Family at bedside. 

## 2018-03-26 NOTE — Patient Instructions (Addendum)
Unfortunately with the increased fluid in the legs and overall swelling, as well as elevated heart rate and borderline blood pressure, further evaluation and possible treatment in the hospital may be needed.  Please proceed to the emergency room for further evaluation today. I also see some possible signs of jaundice today, so will likely need to have some liver testing performed as well when you are in the ER.   After you have been seen in the ER or  hospitalization, I am happy to follow-up with you further. If you have lab work done today you will be contacted with your lab results within the next 2 weeks.  If you have not heard from [place] then please contact us. The fastest way to get your results is to register for My Chart.   IF you received an x-ray today, you will receive an invoice from [facility]. Please contact [facility] at [phone] with questions or concerns regarding your invoice.   IF you received labwork today, you will receive an invoice from [facility]. Please contact [facility] at [phone] with questions or concerns regarding your invoice.   Our billing staff will not be able to assist you with questions regarding bills from these companies.  You will be contacted with the lab results as soon as they are available. The fastest way to get your results is to activate your My Chart account. Instructions are located on the last page of this paperwork. If you have not heard from [place] regarding the results in 2 weeks, please contact this office.

## 2018-03-27 ENCOUNTER — Other Ambulatory Visit: Payer: Self-pay

## 2018-03-27 ENCOUNTER — Inpatient Hospital Stay (HOSPITAL_COMMUNITY): Payer: BLUE CROSS/BLUE SHIELD

## 2018-03-27 ENCOUNTER — Encounter (HOSPITAL_COMMUNITY): Payer: Self-pay | Admitting: *Deleted

## 2018-03-27 DIAGNOSIS — E291 Testicular hypofunction: Secondary | ICD-10-CM

## 2018-03-27 DIAGNOSIS — E039 Hypothyroidism, unspecified: Secondary | ICD-10-CM

## 2018-03-27 DIAGNOSIS — E220 Acromegaly and pituitary gigantism: Secondary | ICD-10-CM

## 2018-03-27 DIAGNOSIS — M4712 Other spondylosis with myelopathy, cervical region: Secondary | ICD-10-CM

## 2018-03-27 DIAGNOSIS — E2740 Unspecified adrenocortical insufficiency: Secondary | ICD-10-CM

## 2018-03-27 DIAGNOSIS — I340 Nonrheumatic mitral (valve) insufficiency: Secondary | ICD-10-CM

## 2018-03-27 LAB — CREATININE, SERUM
Creatinine, Ser: 0.65 mg/dL (ref 0.61–1.24)
GFR calc Af Amer: >60 mL/min (ref >60)
GFR calc non Af Amer: >60 mL/min (ref >60)

## 2018-03-27 LAB — ECHOCARDIOGRAM COMPLETE
Height: 78 in
Weight: 5276.93 oz

## 2018-03-27 LAB — CBC
HCT: 31.2 % — ABNORMAL LOW (ref 39.0–52.0)
Hemoglobin: 9.8 g/dL — ABNORMAL LOW (ref 13.0–17.0)
MCH: 31.4 pg (ref 26.0–34.0)
MCHC: 31.4 g/dL (ref 30.0–36.0)
MCV: 100 fL (ref 80.0–100.0)
Platelets: 243 10*3/uL (ref 150–400)
RBC: 3.12 MIL/uL — ABNORMAL LOW (ref 4.22–5.81)
RDW: 15.3 % (ref 11.5–15.5)
WBC: 9.3 10*3/uL (ref 4.0–10.5)
nRBC: 0 % (ref 0.0–0.2)

## 2018-03-27 LAB — URINALYSIS, ROUTINE W REFLEX MICROSCOPIC
Bacteria, UA: NONE SEEN
Glucose, UA: NEGATIVE mg/dL
Ketones, ur: 20 mg/dL — AB
Leukocytes,Ua: NEGATIVE
Nitrite: NEGATIVE
Protein, ur: NEGATIVE mg/dL
Specific Gravity, Urine: 1.041 — ABNORMAL HIGH (ref 1.005–1.030)
pH: 5 (ref 5.0–8.0)

## 2018-03-27 LAB — HEPATITIS PANEL, ACUTE
HCV Ab: <0.1 s/co ratio (ref 0.0–0.9)
Hep A IgM: NEGATIVE
Hep B C IgM: NEGATIVE
Hepatitis B Surface Ag: NEGATIVE

## 2018-03-27 LAB — BILIRUBIN, FRACTIONATED(TOT/DIR/INDIR)
Bilirubin, Direct: 2 mg/dL — ABNORMAL HIGH (ref 0.0–0.2)
Bilirubin, Direct: 1.3 mg/dL — ABNORMAL HIGH (ref 0.0–0.2)
Indirect Bilirubin: 2.5 mg/dL — ABNORMAL HIGH (ref 0.3–0.9)
Indirect Bilirubin: 2.4 mg/dL — ABNORMAL HIGH (ref 0.3–0.9)
Total Bilirubin: 4.5 mg/dL — ABNORMAL HIGH (ref 0.3–1.2)
Total Bilirubin: 3.7 mg/dL — ABNORMAL HIGH (ref 0.3–1.2)

## 2018-03-27 LAB — COMPREHENSIVE METABOLIC PANEL
ALT: 38 U/L (ref 0–44)
AST: 32 U/L (ref 15–41)
Albumin: 3.2 g/dL — ABNORMAL LOW (ref 3.5–5.0)
Alkaline Phosphatase: 162 U/L — ABNORMAL HIGH (ref 38–126)
Anion gap: 12 (ref 5–15)
BUN: 18 mg/dL (ref 6–20)
CO2: 22 mmol/L (ref 22–32)
Calcium: 8 mg/dL — ABNORMAL LOW (ref 8.9–10.3)
Chloride: 95 mmol/L — ABNORMAL LOW (ref 98–111)
Creatinine, Ser: 0.64 mg/dL (ref 0.61–1.24)
GFR calc Af Amer: >60 mL/min (ref >60)
GFR calc non Af Amer: >60 mL/min (ref >60)
Glucose, Bld: 110 mg/dL — ABNORMAL HIGH (ref 70–99)
Potassium: 3.6 mmol/L (ref 3.5–5.1)
Sodium: 129 mmol/L — ABNORMAL LOW (ref 135–145)
Total Bilirubin: 4.5 mg/dL — ABNORMAL HIGH (ref 0.3–1.2)
Total Protein: 5.6 g/dL — ABNORMAL LOW (ref 6.5–8.1)

## 2018-03-27 LAB — OSMOLALITY: Osmolality: 270 mOsm/kg — ABNORMAL LOW (ref 275–295)

## 2018-03-27 LAB — PROTEIN / CREATININE RATIO, URINE
Creatinine, Urine: 24.75 mg/dL
Protein Creatinine Ratio: 0.16 mg/mg{Cre} — ABNORMAL HIGH (ref 0.00–0.15)
Total Protein, Urine: <6 mg/dL

## 2018-03-27 LAB — TSH: TSH: 0.019 u[IU]/mL — ABNORMAL LOW (ref 0.350–4.500)

## 2018-03-27 LAB — PROTIME-INR
INR: 1.2 (ref 0.8–1.2)
Prothrombin Time: 15.3 seconds — ABNORMAL HIGH (ref 11.4–15.2)

## 2018-03-27 LAB — OSMOLALITY, URINE: Osmolality, Ur: 692 mOsm/kg (ref 300–900)

## 2018-03-27 LAB — HIV ANTIBODY (ROUTINE TESTING W REFLEX): HIV Screen 4th Generation wRfx: NONREACTIVE

## 2018-03-27 MED ORDER — IOHEXOL 350 MG/ML SOLN
100.0000 mL | Freq: Once | INTRAVENOUS | Status: AC | PRN
  Administered 2018-03-27: 100 mL via INTRAVENOUS

## 2018-03-27 MED ORDER — POLYETHYLENE GLYCOL 3350 17 G PO PACK
17.0000 g | PACK | Freq: Every day | ORAL | Status: DC | PRN
  Administered 2018-03-27: 17 g via ORAL
  Filled 2018-03-27: qty 1

## 2018-03-27 MED ORDER — BISACODYL 10 MG RE SUPP: 10.0000 mg | Freq: Every day | RECTAL | Status: DC | PRN

## 2018-03-27 MED ORDER — SIMETHICONE 80 MG PO CHEW
80.0000 mg | CHEWABLE_TABLET | Freq: Four times a day (QID) | ORAL | Status: DC | PRN
  Administered 2018-03-27 – 2018-03-28 (×2): 80 mg via ORAL
  Filled 2018-03-27 (×2): qty 1

## 2018-03-27 MED ORDER — SODIUM CHLORIDE (PF) 0.9 % IJ SOLN
INTRAMUSCULAR | Status: AC
  Filled 2018-03-27: qty 100

## 2018-03-27 MED ORDER — SODIUM CHLORIDE 0.9% FLUSH
3.0000 mL | Freq: Two times a day (BID) | INTRAVENOUS | Status: DC
  Administered 2018-03-27 – 2018-04-01 (×11): 3 mL via INTRAVENOUS

## 2018-03-27 MED ORDER — HYDROCODONE-ACETAMINOPHEN 7.5-325 MG PO TABS
1.0000 | ORAL_TABLET | Freq: Four times a day (QID) | ORAL | Status: DC | PRN
  Administered 2018-03-30: 1 via ORAL
  Filled 2018-03-27: qty 1

## 2018-03-27 MED ORDER — POLYETHYLENE GLYCOL 3350 17 G PO PACK
17.0000 g | PACK | Freq: Two times a day (BID) | ORAL | Status: DC
  Administered 2018-03-27: 17 g via ORAL
  Filled 2018-03-27 (×3): qty 1

## 2018-03-27 MED ORDER — SODIUM CHLORIDE (PF) 0.9 % IJ SOLN
INTRAMUSCULAR | Status: AC
  Filled 2018-03-27: qty 50

## 2018-03-27 MED ORDER — HYDROCORTISONE NA SUCCINATE PF 100 MG IJ SOLR
50.0000 mg | Freq: Four times a day (QID) | INTRAMUSCULAR | Status: DC
  Administered 2018-03-27 (×2): 50 mg via INTRAVENOUS
  Filled 2018-03-27 (×3): qty 1

## 2018-03-27 MED ORDER — ENOXAPARIN SODIUM 40 MG/0.4ML ~~LOC~~ SOLN
40.0000 mg | Freq: Every day | SUBCUTANEOUS | Status: DC
  Administered 2018-03-27 – 2018-03-29 (×3): 40 mg via SUBCUTANEOUS
  Filled 2018-03-27 (×3): qty 0.4

## 2018-03-27 MED ORDER — SODIUM CHLORIDE 0.9 % IV SOLN: 250.0000 mL | INTRAVENOUS | Status: DC | PRN

## 2018-03-27 MED ORDER — DOCUSATE SODIUM 50 MG PO CAPS
250.0000 mg | ORAL_CAPSULE | Freq: Every day | ORAL | Status: DC
  Administered 2018-03-27 – 2018-03-29 (×3): 250 mg via ORAL
  Filled 2018-03-27 (×3): qty 1

## 2018-03-27 MED ORDER — FERROUS SULFATE 325 (65 FE) MG PO TABS
325.0000 mg | ORAL_TABLET | Freq: Two times a day (BID) | ORAL | Status: DC
  Administered 2018-03-27 – 2018-04-01 (×10): 325 mg via ORAL
  Filled 2018-03-27 (×10): qty 1

## 2018-03-27 MED ORDER — GABAPENTIN 300 MG PO CAPS
300.0000 mg | ORAL_CAPSULE | Freq: Two times a day (BID) | ORAL | Status: DC
  Administered 2018-03-27 – 2018-04-01 (×12): 300 mg via ORAL
  Filled 2018-03-27 (×12): qty 1

## 2018-03-27 MED ORDER — LEVOTHYROXINE SODIUM 100 MCG PO TABS
200.0000 ug | ORAL_TABLET | Freq: Every day | ORAL | Status: DC
  Administered 2018-03-27 – 2018-04-01 (×6): 200 ug via ORAL
  Filled 2018-03-27 (×7): qty 2

## 2018-03-27 MED ORDER — ONDANSETRON HCL 4 MG/2ML IJ SOLN
4.0000 mg | Freq: Four times a day (QID) | INTRAMUSCULAR | Status: DC | PRN
  Administered 2018-03-27 – 2018-03-28 (×3): 4 mg via INTRAVENOUS
  Filled 2018-03-27 (×2): qty 2

## 2018-03-27 MED ORDER — SENNA 8.6 MG PO TABS
2.0000 | ORAL_TABLET | Freq: Every day | ORAL | Status: DC
  Administered 2018-03-27 – 2018-03-29 (×2): 17.2 mg via ORAL
  Filled 2018-03-27 (×4): qty 2

## 2018-03-27 MED ORDER — SODIUM CHLORIDE 0.9% FLUSH: 3.0000 mL | INTRAVENOUS | Status: DC | PRN

## 2018-03-27 MED ORDER — HYDROCORTISONE NA SUCCINATE PF 100 MG IJ SOLR
50.0000 mg | Freq: Every day | INTRAMUSCULAR | Status: DC
  Administered 2018-03-28 – 2018-03-29 (×2): 50 mg via INTRAVENOUS
  Filled 2018-03-27 (×2): qty 1

## 2018-03-27 NOTE — Plan of Care (Signed)
Pt stable though remains weak, tired, and pale overall. Rn answered questions in room as appropriate. No needs at this time. No changes needed to care plans. Will  continue to monitor.

## 2018-03-27 NOTE — ED Notes (Signed)
ED TO INPATIENT HANDOFF REPORT  ED Nurse Name and Phone #: Clarise Cruz [phone]  S Name/Age/Gender Mark Gallagher 48 y.o. male Room/Bed: WA24/WA24  Code Status   Code Status: Prior  Home/SNF/Other Home Patient oriented to: self Is this baseline? Yes   Triage Complete: Triage complete  Chief Complaint fliud on legs, hip replacement  Triage Note Pt with bilateral leg edema and fluid retention. Pt states he weighed 241 on 03/15/2018, 276 on 3/3/202 and 286 today. Pt states legs are painful and tight and difficulty ambulating with so much fluid on lower extremities. Pt states his L arm is also edematous and pt was seen at [facility] urgent care today and they drew blood.    Allergies Allergies  Allergen Reactions  . Oxycodone Nausea Only    Level of Care/Admitting Diagnosis ED Disposition    ED Disposition Condition [facility] Area: [facility] [[id number]]  Level of Care: Med-Surg [16]  Diagnosis: Hyponatremia [426834]  Admitting Physician: Eston Esters  Attending Physician: Gwynne Edinger [[id number]]  Estimated length of stay: past midnight tomorrow  Certification:: I certify this patient will need inpatient services for at least 2 midnights  PT Class (Do Not Modify): Inpatient [101]  PT Acc Code (Do Not Modify): Private [1]       B Medical/Surgery History Past Medical History:  Diagnosis Date  . 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 Arthritis   . Cancer ([place])   . History of pituitary tumor   . Hypertension   . Hypothyroidism   . Knee pain, bilateral   . Pneumonia   . Testosterone deficiency   . Thyroid disease    Past Surgical History:  Procedure Laterality Date  . CARDIAC ELECTROPHYSIOLOGY STUDY AND ABLATION    . CERVICAL DISC SURGERY  2007   Anterior C4/5 vertebrectomy and c3-C6 laminoplasty with plating  . CRANIOTOMY FOR TUMOR     pituitary adenoma  . TOTAL HIP ARTHROPLASTY Right 03/16/2018   Procedure: TOTAL HIP ARTHROPLASTY  Anterior;  Surgeon: Renette Butters, MD;  Location: [facility] ORS;  Service: Orthopedics;  Laterality: Right;     A IV Location/Drains/Wounds Patient Lines/Drains/Airways Status   Active Line/Drains/Airways    Name:   Placement date:   Placement time:   Site:   Days:   Peripheral IV 03/26/18 Right Antecubital   03/26/18    1516    Antecubital   1   Incision (Closed) 03/16/18 Hip Right   03/16/18    1339     11          Intake/Output Last 24 hours  Intake/Output Summary (Last 24 hours) at 03/27/2018 0010 Last data filed at 03/27/2018 0008 Gross per 24 hour  Intake -  Output 775 ml  Net -775 ml    Labs/Imaging Results for orders placed or performed during the hospital encounter of 03/26/18 (from the past 48 hour(s))  Comprehensive metabolic panel     Status: Abnormal   Collection Time: 03/26/18  3:14 PM  Result Value Ref Range   Sodium 128 (L) 135 - 145 mmol/L   Potassium 3.6 3.5 - 5.1 mmol/L   Chloride 93 (L) 98 - 111 mmol/L   CO2 25 22 - 32 mmol/L   Glucose, Bld 113 (H) 70 - 99 mg/dL   BUN 22 (H) 6 - 20 mg/dL   Creatinine, Ser 0.75 0.61 - 1.24 mg/dL   Calcium 8.2 (L) 8.9 - 10.3 mg/dL   Total Protein  5.6 (L) 6.5 - 8.1 g/dL   Albumin 3.0 (L) 3.5 - 5.0 g/dL   AST 22 15 - 41 U/L   ALT 24 0 - 44 U/L   Alkaline Phosphatase 153 (H) 38 - 126 U/L   Total Bilirubin 3.4 (H) 0.3 - 1.2 mg/dL   GFR calc non Af Amer >60 >60 mL/min   GFR calc Af Amer >60 >60 mL/min   Anion gap 10 5 - 15    Comment: Performed at [facility], [place] [address], [place], [place] [address]  CBC with Differential     Status: Abnormal   Collection Time: 03/26/18  3:14 PM  Result Value Ref Range   WBC 10.4 4.0 - 10.5 K/uL   RBC 3.24 (L) 4.22 - 5.81 MIL/uL   Hemoglobin 10.1 (L) 13.0 - 17.0 g/dL   HCT 32.9 (L) 39.0 - 52.0 %   MCV 101.5 (H) 80.0 - 100.0 fL   MCH 31.2 26.0 - 34.0 pg   MCHC 30.7 30.0 - 36.0 g/dL   RDW 15.4 11.5 - 15.5 %   Platelets 300 150 - 400 K/uL   nRBC 0.0 0.0 - 0.2 %    Neutrophils Relative % 80 %   Neutro Abs 8.2 (H) 1.7 - 7.7 K/uL   Lymphocytes Relative 8 %   Lymphs Abs 0.9 0.7 - 4.0 K/uL   Monocytes Relative 10 %   Monocytes Absolute 1.1 (H) 0.1 - 1.0 K/uL   Eosinophils Relative 1 %   Eosinophils Absolute 0.1 0.0 - 0.5 K/uL   Basophils Relative 0 %   Basophils Absolute 0.0 0.0 - 0.1 K/uL   Immature Granulocytes 1 %   Abs Immature Granulocytes 0.08 (H) 0.00 - 0.07 K/uL    Comment: Performed at [facility], [place] [address], [place], [place] [address]  Lipase, blood     Status: None   Collection Time: 03/26/18  3:14 PM  Result Value Ref Range   Lipase 24 11 - 51 U/L    Comment: Performed at [facility], [place] [address] [place], [place] [address]  Brain natriuretic peptide     Status: Abnormal   Collection Time: 03/26/18  3:14 PM  Result Value Ref Range   B Natriuretic Peptide 105.5 (H) 0.0 - 100.0 pg/mL    Comment: Performed at [facility], [place] [address], [place], [place] [address]  Sodium, urine, random     Status: None   Collection Time: 03/26/18  9:29 PM  Result Value Ref Range   Sodium, Ur <10 mmol/L    Comment: Performed at [facility], [place] [address], [place], [place] [address]   Dg Chest 2 View  Result Date: 03/26/2018 CLINICAL DATA:  Lower extremity edema and shortness of breath EXAM: CHEST - 2 VIEW COMPARISON:  None. FINDINGS: There is left basilar atelectasis. Cardiomediastinal contours are normal. No pleural effusion or pneumothorax. No pulmonary edema or focal airspace consolidation. IMPRESSION: Left basilar atelectasis. Electronically Signed   By: Ulyses Jarred M.D.   On: 03/26/2018 16:57   Ct Abdomen Pelvis W Contrast  Result Date: 03/26/2018 CLINICAL DATA:  Postop right hip arthroplasty on 03/16/2018. Patient presents with bilateral leg edema and fluid retention. Soft tissue edema diffusely. EXAM: CT ABDOMEN AND PELVIS WITH CONTRAST TECHNIQUE:  Multidetector CT imaging of the abdomen and pelvis was performed using the standard protocol following bolus administration of intravenous contrast. CONTRAST:  146mL ISOVUE-300 IOPAMIDOL (ISOVUE-300) INJECTION 61% COMPARISON:  Lumbar  spine MRI 01/21/2018 FINDINGS: Lower chest: Borderline cardiomegaly without pericardial effusion. Small hiatal hernia. Bibasilar atelectasis left greater than right. Trace bilateral pleural effusions. Hepatobiliary: No focal liver abnormality is seen. No gallstones, gallbladder wall thickening, or biliary dilatation. Pancreas: Unremarkable. No pancreatic ductal dilatation or surrounding inflammatory changes. Spleen: Normal in size without focal abnormality. Adrenals/Urinary Tract: Normal bilateral adrenal glands. No nephrolithiasis is identified no obstructive uropathy or enhancing renal mass. The urinary bladder is physiologically distended without focal mural thickening or calculi. Stomach/Bowel: There is a small hiatal hernia. Contrast is noted within the stomach without significant distention. The duodenal bulb, sweep and ligament of Treitz are normal. No significant small bowel dilatation is seen. Moderate to large amount of retained stool is seen within the right colon with gaseous distention of the transverse and proximal descending colon. No mechanical large bowel obstruction is noted. Vascular/Lymphatic: There is an unusual appearance of the left renal vein which is slightly hyperdense and dilated in appearance up to 3.2 cm AP. Similarly the left renal artery is prominent and dilated up to 1 cm in diameter. There are coarse calcifications noted in the left renal hilum and the possibility of an renal AV fistula or malformation accounting for this appearance is raised. The left renal vein is circumaortic. The right renal vein and artery are unremarkable. The IVC is flat and nondistended. No lymphadenopathy. Reproductive: Normal size prostate and seminal vesicles. Other: Diffuse  subcutaneous soft tissue edema consistent with anasarca is noted of the body wall and extending into the thighs. Moderate free fluid in the pelvis. Musculoskeletal: Susceptibility artifacts from the patient's right hip arthroplasty with postop change accounting for a few dots of air along the anterior aspect of the thigh are identified. Faint hyperdensities noted within the thigh musculature are noted on series 2/119 felt secondary to streak artifacts and less likely due to hemorrhage. No hyperdense fluid is seen within the musculature or significant hematoma. Chronic anterior compression of T12. Multilevel thoracolumbar degenerative disc disease. IMPRESSION: 1. Unusually dilated left renal vein and left renal artery with dystrophic calcifications in the left renal hilum may represent the presence of a renal AV fistula or malformation. Conceivably this could contribute to heart failure and contribute to the development of soft tissue edema/anasarca and free fluid within the pelvis. Suggest CT angiogram of the abdomen to rule out a left renal AVF. 2. Postop change of the right thigh status post hip arthroplasty with postop soft tissue tiny foci of emphysema and edema. 3. Trace bilateral pleural effusions with atelectasis. Borderline cardiomegaly. Electronically Signed   By: Ashley Royalty M.D.   On: 03/26/2018 22:20    Pending Labs Unresulted Labs (From admission, onward)    Start     Ordered   03/26/18 2356  Comprehensive metabolic panel  ONCE - STAT,   STAT    Question:  Specimen collection method  Answer:  Unit=Unit collect   03/26/18 2355   03/26/18 2344  Protein / creatinine ratio, urine  ONCE - STAT,   STAT     03/26/18 2343   03/26/18 2343  Urinalysis, Routine w reflex microscopic  Once,   R     03/26/18 2342   03/26/18 2129  Osmolality, urine  Once,   R     03/26/18 2129   03/26/18 1941  Osmolality  Add-on,   R     03/26/18 1943   03/26/18 1941  TSH  Add-on,   R     03/26/18 1943   03/26/18  1941  Bilirubin, fractionated(tot/dir/indir)  Add-on,   R     03/26/18 1943   03/26/18 1941  Protime-INR  Add-on,   R     03/26/18 1943   03/26/18 1615  Hepatitis panel, acute  ONCE - STAT,   STAT     03/26/18 1614   Signed and Held  HIV antibody (Routine Testing)  Once,   R     Signed and Held   Signed and Held  CBC  (enoxaparin (LOVENOX)    CrCl >/= 30 ml/min)  Once,   R    Comments:  Baseline for enoxaparin therapy IF NOT ALREADY DRAWN.  Notify MD if PLT < 100 K.    Signed and Held   Signed and Held  Creatinine, serum  (enoxaparin (LOVENOX)    CrCl >/= 30 ml/min)  Once,   R    Comments:  Baseline for enoxaparin therapy IF NOT ALREADY DRAWN.    Signed and Held   Signed and Held  Creatinine, serum  (enoxaparin (LOVENOX)    CrCl >/= 30 ml/min)  Weekly,   R    Comments:  while on enoxaparin therapy    Signed and Held          Vitals/Pain Today's Vitals   03/26/18 2100 03/26/18 2215 03/26/18 2300 03/26/18 2345  BP: 117/89 107/69 117/87 117/71  Pulse: (!) 53 83 91 84  Resp: (!) 21 15 17 16   Temp:      TempSrc:      SpO2: 98% 95% 94% 96%  Weight:      Height:      PainSc:        Isolation Precautions No active isolations  Medications Medications  sodium chloride (PF) 0.9 % injection (has no administration in time range)  iohexol (OMNIPAQUE) 300 MG/ML solution 30 mL (has no administration in time range)  hydrocortisone sodium succinate (SOLU-CORTEF) 100 MG injection 100 mg (100 mg Intravenous Given 03/26/18 2020)  iopamidol (ISOVUE-300) 61 % injection 100 mL (100 mLs Intravenous Contrast Given 03/26/18 2123)    Mobility non-ambulatory Low fall risk   Focused Assessments: Gastrointestinal   R Recommendations: See Admitting Provider Note  Report given to:   Additional Notes:  Patient with recent right hip replacement. Non-ambulatory with ED. Using urinal for restroom with assistance. Patient has acromegaly.

## 2018-03-27 NOTE — Progress Notes (Signed)
  Echocardiogram 2D Echocardiogram has been performed.  Mark Gallagher 03/27/2018, 12:26 PM

## 2018-03-27 NOTE — Progress Notes (Signed)
Nursing staff unable to locate ted hose in pt size. SCDs applied to pt to replaced Ted hose.

## 2018-03-27 NOTE — Plan of Care (Signed)
Care plan initiated, Call bell reviewed with pt for needs and safety

## 2018-03-27 NOTE — Progress Notes (Signed)
Pt back from CT vomiting with no other associated symptoms. RN notified md and awaiting orders.

## 2018-03-27 NOTE — Progress Notes (Signed)
PROGRESS NOTE  Mark Gallagher  ZJI:[id number] DOB: Oct 17, 1970 DOA: 03/26/2018 PCP: Wendie Agreste, MD  Outpatient Specialists: Dr. Pleas Patricia, cardiology; Dr. Jerl Mina and Manley Mason, [place] endocrinology; Dr. Derrill Memo, neurosurgery/oncology; Dr. Agapito Games, orthopedics Brief Narrative: Mark Gallagher is a 48 y.o. 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 He had been admitted 2/25 for routine right THA by Dr. Percell Miller. Despite empiric stress steroids, he was hypotensive postoperatively, EBL mentioned to be 1.1L, started on neo, given 2u PRBCs and albumin, and transferred to the ICU. Stress steroids were continued and ultimately he was weaned from pressors, required another unit of PRBCs, but ultimately anemia stabilized. Hydrocortisone was weaned from 2/29 through 3/2. He had leg swelling, LUE swelling which was worked up with negative doppler U/S, but by the day of discharge he was up about 30lbs (276lbs on 3/3 and documented 242lbs at office visit on 2/18). He was discharged 3/3, holding losartan and metoprolol pending cardiology follow up, placed on aspirin 81mg  BID for DVT prophylaxis and back on home hydrocortisone 10mg  daily in addition to aleve, hydrocodone, and robaxin with home medications. He was feeling ok at discharge, had had a BM, but had poor appetite, noticed continued swelling. He was going to follow up with cardiology in [place] but felt too nauseated to travel, so followed up with his PCP 3/6 where he reported continued swelling, up to 285lbs, referred to the ED.   In the ED he appeared with anasarca slight jaundice, afebrile, with hyponatremia at 128 down from 138 at recent discharge, creatinine stable, WBC 10.4k, Hgb 10.1g/dl up from  8.1g/dl at recent discharge. Albumin was 3, total bilirubin was 3.4 with normal AST, ALT, and modestly elevated alkaline phosphatase at 153. Urinalysis showed elevated specific gravity with urine sodium < 10, 0-5 WBCs and RBCs on microscopy, there were ketones but no protein or bacteria. CT abd/pelvis demonstrated diffuse anasarca, borderline cardiomegaly with trace pleural effusions, normal hepatobiliary system, flat IVC, moderate-large stool burden, and abnormal dilation of the left renal artery and vein. 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 Slightly hypoalbuminemic which is likely contributing. Hepatobiliary structure normal on CT, no proteinuria on urinalysis. He did receive a substantial amount of steroids during the time of these symptoms which is a leading possibility in the differential. No DVT on U/S previously. - Echocardiogram. He has diffuse third spacing of fluid without pulmonary edema on CXR or JVD, flat IVC on CT. History of NICM (likely dilated due to acromegaly) though previous echos have showed preserved LVEF.  - Deescalate steroids - Consider lasix +/- albumin depending on echo. - Daily weights, strict I/O  Nausea, vomiting: Uncertain, could be due to overly rapid tapering of steroids or conversely due to the previous protracted stress dose steroids causing fluid retention and inta-abdominal swelling - Encourage po, especially protein intake.  - Antiemetics prn.  Panhypopituitarism s/p pituitary tumor resection:  - Continue testosterone 100mg  q2weeks at PCP (last 3/6) - Continue synthroid 219mcg daily  for now. TSH is low as would be expected, check free T3 and free T4.   - Continue octreotide 30mg  q28d - Has remained normotensive prior to stress steroids. Hyponatremia noted without hyperkalemia or metabolic acidosis. I do not believe he currently requires stress steroids.   Hyperbilirubinemia: Cholestatic pattern with normal AST, ALT, though no hepatobiliary ductal dilatation or parenchymal abnormalities on CT. Urine has been darker.  - Fractionated bilirubin. Doubt hemolysis based on stable hgb, platelets.  - ?Due to congestive hepatopathy. Will continue monitoring daily  Left renal vascular malformation: Abnormal imaging at admission. Discussed with Dr. Anselm Pancoast who reviewed images. Feels the CTA would be significantly higher yield than U/S.  - CTA renal with arterial and venous flow evaluation.  Hyponatremia: Multifactorial, though with hypoosmolar state and low UNa, suspect due primarily to dehydration in setting of N/V/poor po. Possibly related to opioids, pain.  - Intravascularly depleted but peripherally overloaded, so will not give IVF's at this time.  - Monitor with AM labs  Acute blood loss anemia: Stable/improved from prior admission.  - Monitor for bleeding - Trend CBC.  Right hip OA s/p THA 2/25:  - Routine postoperative care - DVT ppx in place - Will get up with assistance - Needs routine orthopedic follow up   History of AFlutter, WPW: Currently in sinus rhythm.  - Monitor HR  Cervical spondylosis: s/p fusions x2, no current myelopathy.  - Continue pain medications prn - Kpad prn  DVT prophylaxis: Lovenox Code Status: Full Family Communication: Multiple at bedside Disposition Plan: Uncertain.  Consultants:   Radiology, Dr. Anselm Pancoast 3/7.  Procedures:   Echocardiogram 03/27/2018:  1. The left ventricle has normal systolic function, with an ejection fraction of 55-60%. The cavity size was normal. Left ventricular diastolic Doppler parameters are indeterminate.  2. Small pericardial effusion.  3. The pericardial effusion is posterior  to the left ventricle (small) and surrounding the apex (moderate).  4. The aortic valve is tricuspid. Mild aortic annular calcification noted. No definitive aortic regurgitation.  5. The mitral valve is normal in structure. Mild thickening of the mitral valve leaflet.  6. There is moderate to severe dilatation of the aortic root. Measures 51 mm in the parasternal views. Suggest further imaging if not already assessed (CTA).  Antimicrobials:  None   Subjective: Swelling is stable, appetite slightly improved, still weak due to heaviness of legs. No dyspnea, chest pain, abdominal pain. No EtOH ever, no smoking, no illicit drugs. Has not missed or added doses of any medications.   Objective: Vitals:   03/27/18 0109 03/27/18 0417 03/27/18 0721 03/27/18 1445  BP: 119/76 117/72  121/75  Pulse: 91 90  (!) 101  Resp: 15 17  16   Temp: 98.3 F (36.8 C) 98 F (36.7 C)  98.6 F (37 C)  TempSrc: Oral Oral    SpO2: 96% 96%    Weight:   (!) 149.6 kg   Height:   6\' 6"  (1.981 m)     Intake/Output Summary (Last 24 hours) at 03/27/2018 1553 Last data filed at 03/27/2018 1438 Gross per 24 hour  Intake 1570 ml  Output 3400 ml  Net -1830 ml   Filed Weights   03/26/18 1425 03/27/18 0721  Weight: 129.7 kg (!) 149.6 kg    Gen: Acromegalic habitus, pleasant 48yo male in no distress Pulm: Non-labored breathing room air. Clear to auscultation bilaterally.  CV: Regular rate and rhythm. II/VI SEM, no rub, or gallop. No JVD, 3+ diffuse nonpitting edema in legs extending  onto abdomen GI: Abdomen soft, non-tender, non-distended, with normoactive bowel sounds. No organomegaly or masses felt. Ext: Warm, no deformities. Sensation and motor function intact RLE, brisk cap refill. Skin: Right anterior hip dressing c/d/i, no induration, erythema, hemorrhage. Slight pallor and jaundice noted. Neuro: Alert and oriented. No focal neurological deficits. Psych: Judgement and insight appear normal. Mood & affect  appropriate.   Data Reviewed: I have personally reviewed following labs and imaging studies  CBC: Recent Labs  Lab 03/21/18 0349 03/22/18 0357 03/26/18 1212 03/26/18 1514 03/27/18 0218  WBC 6.8 7.9 11.7* 10.4 9.3  NEUTROABS  --   --   --  8.2*  --   HGB 8.0* 8.1* 10.2* 10.1* 9.8*  HCT 25.6* 27.2* 30.4 32.9* 31.2*  MCV 99.6 102.6* 95.5 101.5* 100.0  PLT 139* 154  --  300 413   Basic Metabolic Panel: Recent Labs  Lab 03/21/18 0349 03/22/18 0357 03/26/18 1514 03/27/18 0218  NA 138 138 128* 129*  K 3.6 3.7 3.6 3.6  CL 104 105 93* 95*  CO2 27 28 25 22   GLUCOSE 129* 115* 113* 110*  BUN 20 21* 22* 18  CREATININE 0.57* 0.65 0.75 0.64  0.65  CALCIUM 8.3* 8.1* 8.2* 8.0*   GFR: Estimated Creatinine Clearance: 183.2 mL/min (by C-G formula based on SCr of 0.64 mg/dL). Liver Function Tests: Recent Labs  Lab 03/21/18 0349 03/26/18 1514 03/27/18 0218 03/27/18 1428  AST 15 22 32  --   ALT 19 24 38  --   ALKPHOS 46 153* 162*  --   BILITOT 1.5* 3.4* 4.5*  4.5* 3.7*  PROT 5.5* 5.6* 5.6*  --   ALBUMIN 3.2* 3.0* 3.2*  --    Recent Labs  Lab 03/26/18 1514  LIPASE 24   No results for input(s): AMMONIA in the last 168 hours. Coagulation Profile: Recent Labs  Lab 03/27/18 0218  INR 1.2   Cardiac Enzymes: No results for input(s): CKTOTAL, CKMB, CKMBINDEX, TROPONINI in the last 168 hours. BNP (last 3 results) No results for input(s): PROBNP in the last 8760 hours. HbA1C: No results for input(s): HGBA1C in the last 72 hours. CBG: Recent Labs  Lab 03/22/18 2111 03/23/18 0036 03/23/18 0434 03/23/18 0744 03/23/18 1234  GLUCAP 134* 104* 100* 97 103*   Lipid Profile: No results for input(s): CHOL, HDL, LDLCALC, TRIG, CHOLHDL, LDLDIRECT in the last 72 hours. Thyroid Function Tests: Recent Labs    03/27/18 0218  TSH 0.019*   Anemia Panel: No results for input(s): VITAMINB12, FOLATE, FERRITIN, TIBC, IRON, RETICCTPCT in the last 72 hours. Urine analysis:      Component Value Date/Time   COLORURINE AMBER (A) 03/26/2018 2343   APPEARANCEUR CLEAR 03/26/2018 2343   LABSPEC 1.041 (H) 03/26/2018 2343   PHURINE 5.0 03/26/2018 2343   GLUCOSEU NEGATIVE 03/26/2018 2343   HGBUR SMALL (A) 03/26/2018 2343   BILIRUBINUR SMALL (A) 03/26/2018 2343   KETONESUR 20 (A) 03/26/2018 2343   PROTEINUR NEGATIVE 03/26/2018 2343   NITRITE NEGATIVE 03/26/2018 2343   LEUKOCYTESUR NEGATIVE 03/26/2018 2343   No results found for this or any previous visit (from the past 240 hour(s)).    Radiology Studies: Dg Chest 2 View  Result Date: 03/26/2018 CLINICAL DATA:  Lower extremity edema and shortness of breath EXAM: CHEST - 2 VIEW COMPARISON:  None. FINDINGS: There is left basilar atelectasis. Cardiomediastinal contours are normal. No pleural effusion or pneumothorax. No pulmonary edema or focal airspace consolidation. IMPRESSION: Left basilar atelectasis. Electronically Signed   By: Lennette Bihari  [place] M.D.   On: 03/26/2018 16:57   Ct Abdomen Pelvis W Contrast  Result Date: 03/26/2018 CLINICAL DATA:  Postop right hip arthroplasty on 03/16/2018. Patient presents with bilateral leg edema and fluid retention. Soft tissue edema diffusely. EXAM: CT ABDOMEN AND PELVIS WITH CONTRAST TECHNIQUE: Multidetector CT imaging of the abdomen and pelvis was performed using the standard protocol following bolus administration of intravenous contrast. CONTRAST:  124mL ISOVUE-300 IOPAMIDOL (ISOVUE-300) INJECTION 61% COMPARISON:  Lumbar spine MRI 01/21/2018 FINDINGS: Lower chest: Borderline cardiomegaly without pericardial effusion. Small hiatal hernia. Bibasilar atelectasis left greater than right. Trace bilateral pleural effusions. Hepatobiliary: No focal liver abnormality is seen. No gallstones, gallbladder wall thickening, or biliary dilatation. Pancreas: Unremarkable. No pancreatic ductal dilatation or surrounding inflammatory changes. Spleen: Normal in size without focal abnormality. Adrenals/Urinary  Tract: Normal bilateral adrenal glands. No nephrolithiasis is identified no obstructive uropathy or enhancing renal mass. The urinary bladder is physiologically distended without focal mural thickening or calculi. Stomach/Bowel: There is a small hiatal hernia. Contrast is noted within the stomach without significant distention. The duodenal bulb, sweep and ligament of Treitz are normal. No significant small bowel dilatation is seen. Moderate to large amount of retained stool is seen within the right colon with gaseous distention of the transverse and proximal descending colon. No mechanical large bowel obstruction is noted. Vascular/Lymphatic: There is an unusual appearance of the left renal vein which is slightly hyperdense and dilated in appearance up to 3.2 cm AP. Similarly the left renal artery is prominent and dilated up to 1 cm in diameter. There are coarse calcifications noted in the left renal hilum and the possibility of an renal AV fistula or malformation accounting for this appearance is raised. The left renal vein is circumaortic. The right renal vein and artery are unremarkable. The IVC is flat and nondistended. No lymphadenopathy. Reproductive: Normal size prostate and seminal vesicles. Other: Diffuse subcutaneous soft tissue edema consistent with anasarca is noted of the body wall and extending into the thighs. Moderate free fluid in the pelvis. Musculoskeletal: Susceptibility artifacts from the patient's right hip arthroplasty with postop change accounting for a few dots of air along the anterior aspect of the thigh are identified. Faint hyperdensities noted within the thigh musculature are noted on series 2/119 felt secondary to streak artifacts and less likely due to hemorrhage. No hyperdense fluid is seen within the musculature or significant hematoma. Chronic anterior compression of T12. Multilevel thoracolumbar degenerative disc disease. IMPRESSION: 1. Unusually dilated left renal vein and left  renal artery with dystrophic calcifications in the left renal hilum may represent the presence of a renal AV fistula or malformation. Conceivably this could contribute to heart failure and contribute to the development of soft tissue edema/anasarca and free fluid within the pelvis. Suggest CT angiogram of the abdomen to rule out a left renal AVF. 2. Postop change of the right thigh status post hip arthroplasty with postop soft tissue tiny foci of emphysema and edema. 3. Trace bilateral pleural effusions with atelectasis. Borderline cardiomegaly. Electronically Signed   By: Ashley Royalty M.D.   On: 03/26/2018 22:20    Scheduled Meds: . docusate sodium  250 mg Oral Daily  . enoxaparin (LOVENOX) injection  40 mg Subcutaneous Daily  . ferrous sulfate  325 mg Oral BID WC  . gabapentin  300 mg Oral BID  . [START ON 03/28/2018] hydrocortisone sod succinate (SOLU-CORTEF) inj  50 mg Intravenous Daily  . levothyroxine  200 mcg Oral Daily  . polyethylene glycol  17  g Oral BID  . senna  2 tablet Oral Daily  . sodium chloride flush  3 mL Intravenous Q12H   Continuous Infusions: . sodium chloride       LOS: 1 day   Time spent: 35 minutes.  Patrecia Pour, MD [facility] [email or web address] Password [facility] 03/27/2018, 3:53 PM

## 2018-03-28 ENCOUNTER — Inpatient Hospital Stay (HOSPITAL_COMMUNITY): Payer: BLUE CROSS/BLUE SHIELD

## 2018-03-28 DIAGNOSIS — I361 Nonrheumatic tricuspid (valve) insufficiency: Secondary | ICD-10-CM

## 2018-03-28 LAB — ECHOCARDIOGRAM LIMITED
Height: 78 in
Weight: 5418.02 oz

## 2018-03-28 LAB — CBC
HCT: 27.7 % — ABNORMAL LOW (ref 39.0–52.0)
Hemoglobin: 8.8 g/dL — ABNORMAL LOW (ref 13.0–17.0)
MCH: 31.8 pg (ref 26.0–34.0)
MCHC: 31.8 g/dL (ref 30.0–36.0)
MCV: 100 fL (ref 80.0–100.0)
Platelets: 277 10*3/uL (ref 150–400)
RBC: 2.77 MIL/uL — ABNORMAL LOW (ref 4.22–5.81)
RDW: 15.9 % — ABNORMAL HIGH (ref 11.5–15.5)
WBC: 8 10*3/uL (ref 4.0–10.5)
nRBC: 0 % (ref 0.0–0.2)

## 2018-03-28 LAB — COMPREHENSIVE METABOLIC PANEL
ALT: 35 U/L (ref 0–44)
AST: 21 U/L (ref 15–41)
Albumin: 2.8 g/dL — ABNORMAL LOW (ref 3.5–5.0)
Alkaline Phosphatase: 130 U/L — ABNORMAL HIGH (ref 38–126)
Anion gap: 9 (ref 5–15)
BUN: 13 mg/dL (ref 6–20)
CO2: 25 mmol/L (ref 22–32)
Calcium: 7.9 mg/dL — ABNORMAL LOW (ref 8.9–10.3)
Chloride: 97 mmol/L — ABNORMAL LOW (ref 98–111)
Creatinine, Ser: 0.71 mg/dL (ref 0.61–1.24)
GFR calc Af Amer: >60 mL/min (ref >60)
GFR calc non Af Amer: >60 mL/min (ref >60)
Glucose, Bld: 128 mg/dL — ABNORMAL HIGH (ref 70–99)
Potassium: 3 mmol/L — ABNORMAL LOW (ref 3.5–5.1)
Sodium: 131 mmol/L — ABNORMAL LOW (ref 135–145)
Total Bilirubin: 2.1 mg/dL — ABNORMAL HIGH (ref 0.3–1.2)
Total Protein: 5 g/dL — ABNORMAL LOW (ref 6.5–8.1)

## 2018-03-28 LAB — LACTATE DEHYDROGENASE: LDH: 213 U/L — ABNORMAL HIGH (ref 98–192)

## 2018-03-28 LAB — T4, FREE: Free T4: 0.71 ng/dL — ABNORMAL LOW (ref 0.82–1.77)

## 2018-03-28 MED ORDER — PERFLUTREN LIPID MICROSPHERE
1.0000 mL | INTRAVENOUS | Status: AC | PRN
  Administered 2018-03-28: 2 mL via INTRAVENOUS
  Filled 2018-03-28: qty 10

## 2018-03-28 MED ORDER — FUROSEMIDE 10 MG/ML IJ SOLN
40.0000 mg | Freq: Two times a day (BID) | INTRAMUSCULAR | Status: DC
  Administered 2018-03-28 – 2018-04-01 (×9): 40 mg via INTRAVENOUS
  Filled 2018-03-28 (×9): qty 4

## 2018-03-28 MED ORDER — POTASSIUM CHLORIDE CRYS ER 20 MEQ PO TBCR
40.0000 meq | EXTENDED_RELEASE_TABLET | Freq: Two times a day (BID) | ORAL | Status: DC
  Administered 2018-03-28 – 2018-03-29 (×4): 40 meq via ORAL
  Filled 2018-03-28 (×4): qty 2

## 2018-03-28 MED ORDER — NITROGLYCERIN 0.4 MG SL SUBL
SUBLINGUAL_TABLET | SUBLINGUAL | Status: AC
  Filled 2018-03-28: qty 1

## 2018-03-28 NOTE — Plan of Care (Signed)
Pt stable this am though anxious about test results and plan of care. No changes need to current plan of care. Pt continues to be weak and tired overall. Family at bedside.

## 2018-03-28 NOTE — Evaluation (Signed)
Physical Therapy Evaluation Patient Details Name: Mark Gallagher MRN: [id number] DOB: 11-04-1970 Today's Date: 03/28/2018   History of Present Illness  48 year old male with a history of acromegaly, panhypopituitarism,  remote resection of pituitary tumor, spinal fusion, L foot drop, atrial flutter and WPW status post ablation x2, history of nonischemic cardiomyopathy and cervical stenosis. recent admission for R DA THA on 03/16/18 per Dr. Percell Miller-- complicated by hypotension and significant anasarca, now re-admitted 03/26/18 with  hyponatremia, Anasarca,  Remains unclear etiology  Clinical Impression  Pt admitted with above diagnosis. Pt currently with functional limitations due to the deficits listed below (see PT Problem List).  Pt known to me from previous admission; will benefit from continued HHPT at d/c, will follow in acute setting  Pt will benefit from skilled PT to increase their independence and safety with mobility to allow discharge to the venue listed below.       Follow Up Recommendations Follow surgeon's recommendation for DC plan and follow-up therapies;Home health PT;Supervision for mobility/OOB    Equipment Recommendations  None recommended by PT    Recommendations for Other Services       Precautions / Restrictions Precautions Precautions: Fall Precaution Comments: L AFO at baseline, has been walking w/o Restrictions Weight Bearing Restrictions: No Other Position/Activity Restrictions: WBAT      Mobility  Bed Mobility Overal bed mobility: Needs Assistance Bed Mobility: Supine to Sit     Supine to sit: Min assist;Mod assist     General bed mobility comments: assist with BLE  Transfers Overall transfer level: Needs assistance Equipment used: Rolling walker (2 wheeled) Transfers: Sit to/from [facility] Sit to Stand: From elevated surface;Min assist;+2 physical assistance;+2 safety/equipment Stand pivot transfers: Min assist;+2  safety/equipment       General transfer comment: min A +2 for stand and steady for peri care, +2 mostly for safety; verbal cues and light assist to complete pivotal steps from bed to chair  Ambulation/Gait             General Gait Details: deferred d/t faitgue  Stairs            Wheelchair Mobility    Modified Rankin (Stroke Patients Only)       Balance Overall balance assessment: Needs assistance Sitting-balance support: Feet supported Sitting balance-Leahy Scale: Good     Standing balance support: Bilateral upper extremity supported;During functional activity Standing balance-Leahy Scale: Poor Standing balance comment: reliant on BUEs this date                             Pertinent Vitals/Pain Pain Assessment: Faces Faces Pain Scale: Hurts a little bit Pain Location: R thigh, generalized Pain Descriptors / Indicators: Aching;Sore Pain Intervention(s): Limited activity within patient's tolerance;Monitored during session;Repositioned    Home Living Family/patient expects to be discharged to:: Private residence Living Arrangements: Parent Available Help at Discharge: Family Type of Home: (townhouse) Home Access: Stairs to enter   Technical brewer of Steps: 1 + 6 + 1 and flight to bedroom  Home Layout: Two level Home Equipment: Tub bench;Grab bars - toilet;Walker - 2 wheels;Cane - quad;Other (comment) Additional Comments: parents are nearby and involved with care, cand d/c to parents home if needed, one level    Prior Function Level of Independence: Needs assistance   Gait / Transfers Assistance Needed: amb with RW since THA  ADL's / Homemaking Assistance Needed: needing inc assist post hip surgery for short period home  Comments: help with R shoe and sock; walks with quad cane and L AFO. Pt drives     Hand Dominance        Extremity/Trunk Assessment   Upper Extremity Assessment Upper Extremity Assessment: Defer to OT  evaluation;Overall WFL for tasks assessed    Lower Extremity Assessment Lower Extremity Assessment: Defer to PT evaluation RLE Deficits / Details: grossly 2+ to 3/5 LLE Deficits / Details: h/o L foot drop       Communication   Communication: No difficulties  Cognition Arousal/Alertness: Awake/alert Behavior During Therapy: WFL for tasks assessed/performed Overall Cognitive Status: Within Functional Limits for tasks assessed                                        General Comments      Exercises Total Joint Exercises Ankle Circles/Pumps: AROM;Right;10 reps   Assessment/Plan    PT Assessment Patient needs continued PT services  PT Problem List Decreased range of motion;Decreased strength;Decreased mobility;Decreased balance;Pain;Decreased knowledge of use of DME;Decreased activity tolerance       PT Treatment Interventions DME instruction;Gait training;Functional mobility training;Stair training;Therapeutic exercise;Therapeutic activities;Patient/family education    PT Goals (Current goals can be found in the Care Plan section)  Acute Rehab PT Goals Patient Stated Goal: sit up in chair PT Goal Formulation: With patient Time For Goal Achievement: 03/28/18 Potential to Achieve Goals: Good    Frequency Min 3X/week   Barriers to discharge        Co-evaluation PT/OT/SLP Co-Evaluation/Treatment: Yes Reason for Co-Treatment: For patient/therapist safety;To address functional/ADL transfers PT goals addressed during session: Mobility/safety with mobility;Proper use of DME OT goals addressed during session: ADL's and self-care       AM-PAC PT "6 Clicks" Mobility  Outcome Measure Help needed turning from your back to your side while in a flat bed without using bedrails?: A Little Help needed moving from lying on your back to sitting on the side of a flat bed without using bedrails?: A Little Help needed moving to and from a bed to a chair (including a  wheelchair)?: A Little Help needed standing up from a chair using your arms (e.g., wheelchair or bedside chair)?: A Little Help needed to walk in hospital room?: A Lot Help needed climbing 3-5 steps with a railing? : A Lot 6 Click Score: 16    End of Session Equipment Utilized During Treatment: Gait belt Activity Tolerance: Patient tolerated treatment well Patient left: in chair;with call bell/phone within reach;with SCD's reapplied   PT Visit Diagnosis: Muscle weakness (generalized) (M62.81);Difficulty in walking, not elsewhere classified (R26.2)    Time: 2683-4196 PT Time Calculation (min) (ACUTE ONLY): 18 min   Charges:   PT Evaluation $PT Eval Low Complexity: 1 Low          Kenyon Ana, PT  Pager: [phone] Acute Rehab Dept [facility]): [phone]   03/28/2018   [facility] 03/28/2018, 1:09 PM

## 2018-03-28 NOTE — Progress Notes (Addendum)
Texted Hospitalist Blount with the verbal report I received from [facility] regarding the prior CT scan.  What I understood him to say was, "Left kidney vascular malformation with nivus 3.1-4.9 cm at hilum.  Early feeling of circum aortic left renal vein on anterior phase"    No orders received.

## 2018-03-28 NOTE — Evaluation (Signed)
Occupational Therapy Evaluation Patient Details Name: Mark Gallagher MRN: [id number] DOB: 1970-10-13 Today's Date: 03/28/2018    History of Present Illness Mark Gallagher is a 48 y/o male who presents with recent R DA-THA (03/16/2018) with postop hypotension; PMH of acromegaly, spinal fusion, L foot drop (wears AFO). He is readmitted for swelling, fatigue, abd distention, vomiting   Clinical Impression   Pt admitted with above diagnoses, with R hip pain and general debility limiting ability to engage in BADL at desired level of ind. Pt with recent d/c from hospital after R DA-THA, sent home with home health. Pt returning due to complications listed above. Pt is ~6'7". This date, pt c/o fatigue and R hip pain/generailzed pain. Bed mobility being completed at min-mod A to sit EOB. Min A +2 to stand with RW and complete peri hygiene at max A. Pt up in chair with additional chair places at feet with pillow support for added length to increase comfort on legs.    Follow Up Recommendations  Home health OT;Supervision/Assistance - 24 hour    Equipment Recommendations  Other (comment)(TBD)    Recommendations for Other Services       Precautions / Restrictions Precautions Precautions: Fall Precaution Comments: L AFO at baseline, has been walking w/o Restrictions Weight Bearing Restrictions: No Other Position/Activity Restrictions: WBAT      Mobility Bed Mobility Overal bed mobility: Needs Assistance Bed Mobility: Supine to Sit;Sit to Supine     Supine to sit: Min assist;Mod assist     General bed mobility comments: a with BLE  Transfers Overall transfer level: Needs assistance Equipment used: Rolling walker (2 wheeled) Transfers: Sit to/from [facility] Sit to Stand: From elevated surface;Min assist;+2 physical assistance;+2 safety/equipment         General transfer comment: min A +2 for stand and steady for peri care, +2 mostly for size/safety    Balance  Overall balance assessment: Needs assistance Sitting-balance support: Feet supported Sitting balance-Leahy Scale: Good     Standing balance support: Bilateral upper extremity supported;During functional activity Standing balance-Leahy Scale: Poor Standing balance comment: reliant on BUEs this date                           ADL either performed or assessed with clinical judgement   ADL Overall ADL's : Needs assistance/impaired Eating/Feeding: Set up;Sitting   Grooming: Set up;Sitting   Upper Body Bathing: Set up;Sitting   Lower Body Bathing: Maximal assistance;Sitting/lateral leans;Sit to/from stand;Cueing for compensatory techniques;With adaptive equipment   Upper Body Dressing : Set up;Sitting   Lower Body Dressing: Maximal assistance;Sit to/from stand;Cueing for sequencing;Cueing for safety;With adaptive equipment;Cueing for compensatory techniques   Toilet Transfer: Minimal assistance;Ambulation;BSC;RW   Toileting- Clothing Manipulation and Hygiene: Maximal assistance;Sit to/from stand Toileting - Clothing Manipulation Details (indicate cue type and reason): Max A this date to clean peri area while standing after episode of incontinence Tub/ Shower Transfer: Moderate assistance;Shower seat;3 in Art therapist mobility during ADLs: Minimal assistance;Rolling walker;Cueing for safety;Cueing for sequencing;+2 for physical assistance General ADL Comments: increased time, with increased fatigue this date     Vision Patient Visual Report: No change from baseline Additional Comments: dysconjugate gaze, baseline     Perception     Praxis      Pertinent Vitals/Pain Pain Assessment: Faces Faces Pain Scale: Hurts a little bit Pain Location: R thigh, generalized Pain Descriptors / Indicators: Aching;Sore Pain Intervention(s): Limited activity within patient's tolerance;Monitored during  session;Repositioned     Hand Dominance     Extremity/Trunk  Assessment Upper Extremity Assessment Upper Extremity Assessment: Overall WFL for tasks assessed   Lower Extremity Assessment Lower Extremity Assessment: Defer to PT evaluation       Communication Communication Communication: No difficulties   Cognition Arousal/Alertness: Awake/alert Behavior During Therapy: WFL for tasks assessed/performed Overall Cognitive Status: Within Functional Limits for tasks assessed                                     General Comments       Exercises     Shoulder Instructions      Home Living Family/patient expects to be discharged to:: Private residence Living Arrangements: Parent Available Help at Discharge: Family   Home Access: Stairs to enter Technical brewer of Steps: 1 + 6 + 1 and flight to bedroom         Bathroom Shower/Tub: Teacher, early years/pre: Handicapped height     Home Equipment: Tub bench;Grab bars - toilet;Walker - 2 wheels;Cane - quad;Other (comment)   Additional Comments: parents are nearby and involved with care      Prior Functioning/Environment Level of Independence: Needs assistance    ADL's / Homemaking Assistance Needed: needing inc assist post hip surgery for short period home            OT Problem List: Decreased strength;Impaired balance (sitting and/or standing);Decreased knowledge of use of DME or AE;Cardiopulmonary status limiting activity;Pain;Decreased activity tolerance      OT Treatment/Interventions: Self-care/ADL training;DME and/or AE instruction;Patient/family education;Balance training;Therapeutic activities;Therapeutic exercise    OT Goals(Current goals can be found in the care plan section) Acute Rehab OT Goals Patient Stated Goal: sit up in chair OT Goal Formulation: With patient Time For Goal Achievement: 04/11/18 Potential to Achieve Goals: Good  OT Frequency: Min 2X/week   Barriers to D/C:            Co-evaluation PT/OT/SLP  Co-Evaluation/Treatment: Yes Reason for Co-Treatment: For patient/therapist safety;To address functional/ADL transfers PT goals addressed during session: Mobility/safety with mobility;Proper use of DME OT goals addressed during session: ADL's and self-care      AM-PAC OT "6 Clicks" Daily Activity     Outcome Measure Help from another person eating meals?: None Help from another person taking care of personal grooming?: A Little Help from another person toileting, which includes using toliet, bedpan, or urinal?: A Little Help from another person bathing (including washing, rinsing, drying)?: A Lot Help from another person to put on and taking off regular upper body clothing?: None Help from another person to put on and taking off regular lower body clothing?: A Lot 6 Click Score: 18   End of Session Equipment Utilized During Treatment: Rolling walker;Gait belt Nurse Communication: Mobility status  Activity Tolerance: Patient tolerated treatment well Patient left: in chair;with call bell/phone within reach;with chair alarm set;with nursing/sitter in room  OT Visit Diagnosis: Other abnormalities of gait and mobility (R26.89);Unsteadiness on feet (R26.81);Pain Pain - Right/Left: Right Pain - part of body: Hip                Time: 1130-1144 OT Time Calculation (min): 14 min Charges:  OT General Charges $OT Visit: 1 Visit OT Evaluation $OT Eval Moderate Complexity: [place], MSOT, OTR/L Behavioral Health OT/ Acute Relief OT [facility] Office: [phone]  Zenovia Jarred 03/28/2018, 12:55 PM

## 2018-03-28 NOTE — Consult Note (Signed)
CONSULTATION NOTE   Patient Name: Mark Gallagher Date of Encounter: 03/28/2018 Cardiologist: No primary care provider on file.  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  He was admitted for right total hip arthroplasty and it was complicated by hypotension and significant anasarca.  HPI   Mark Gallagher is a 48 y.o. male who is being seen today for the evaluation of edema at the request of Dr. Bonner Puna. 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 He has a history of nonischemic cardiomyopathy and cervical stenosis.  He is followed by Dr. Pleas Patricia at [facility].  He was admitted for right total hip arthroplasty and it was complicated by hypotension and significant anasarca.  Apparently he had estimated blood loss of 1.5 L and required both colloid and crystalloid replacement. Echocardiogram was performed yesterday which showed LVEF 55 to 60% and indeterminate diastolic function.  A small pericardial effusion was noted posteriorly and the RV could not be well visualized.  The aortic root was significantly dilated, measuring up to 5.1 cm.  The IVC was also not visualized.  A CT angios of the abdomen demonstrated normal aortic caliber.  Cardiology is asked to make recommendations regarding diuresis.  At this point he appears to be slightly net negative today and overall 1.7 L negative recorded.  Weight however has increased from 150 kg to 154 kg apparently over the past day.  PMHx   Past Medical History:  Diagnosis Date  . 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 Arthritis   . Cancer ([place])   . History of pituitary tumor   . Hypertension   . Hypothyroidism   .  Knee pain, bilateral   . Pneumonia   . Testosterone deficiency   . Thyroid disease     Past Surgical History:  Procedure Laterality Date  . CARDIAC ELECTROPHYSIOLOGY STUDY AND ABLATION    . CERVICAL DISC SURGERY  2007   Anterior C4/5 vertebrectomy and c3-C6 laminoplasty with plating  . CRANIOTOMY FOR TUMOR     pituitary adenoma  . TOTAL HIP ARTHROPLASTY Right 03/16/2018   Procedure: TOTAL HIP ARTHROPLASTY Anterior;  Surgeon: Renette Butters, MD;  Location: [facility];  Service: Orthopedics;  Laterality: Right;    FAMHx   Family History  Problem Relation Age of Onset  . High blood pressure Mother   . Acute myelogenous leukemia Brother     SOCHx    reports that he has never smoked. He has never used smokeless tobacco. He reports that he does not drink alcohol or use drugs.  Outpatient Medications   No current facility-administered medications on file prior to encounter.    Current Outpatient Medications on File Prior to Encounter  Medication Sig Dispense Refill  . aspirin EC 81 MG tablet Take 1 tablet (81 mg total) by mouth 2 (two) times daily. For DVT prophylaxis for 30 days after surgery. 60 tablet 0  . atorvastatin (LIPITOR) 40 MG tablet Take 40 mg by mouth daily.    . Cholecalciferol (VITAMIN D3) 25 MCG (1000 UT) CAPS Take 2,000 Units by mouth daily.     Marland Kitchen docusate sodium (COLACE) 250 MG capsule Take 250 mg by mouth daily.    Marland Kitchen  eplerenone (INSPRA) 50 MG tablet Take 50 mg by mouth daily.    . ferrous sulfate 325 (65 FE) MG tablet Take 1 tablet (325 mg total) by mouth 2 (two) times daily with a meal. 60 tablet 1  . gabapentin (NEURONTIN) 300 MG capsule Take 1 capsule (300 mg total) by mouth 2 (two) times daily for 14 days. For 2 weeks post op for pain. 28 capsule 0  . HYDROcodone-acetaminophen (NORCO) 7.5-325 MG tablet Take 1-2 tablets by mouth every 6 (six) hours as needed for moderate pain.    . hydrocortisone (CORTEF) 10 MG tablet Take 1 tablet daily.  You may take 2  tablets daily with excessive weakness and tiredness and lethargy. (Patient taking differently: Take 10 mg by mouth daily. ) 60 tablet 0  . levothyroxine (SYNTHROID, LEVOTHROID) 200 MCG tablet Take 1 tablet (200 mcg total) by mouth daily.    . methocarbamol (ROBAXIN-750) 750 MG tablet Take 1 tablet (750 mg total) by mouth every 6 (six) hours as needed for muscle spasms. 20 tablet 0  . Multiple Vitamins-Minerals (CENTRUM SILVER PO) Take 1 tablet by mouth daily.    . naproxen sodium (ALEVE) 220 MG tablet Take 220-440 mg by mouth daily as needed (for pain or headache).    Marland Kitchen octreotide (SANDOSTATIN LAR) 30 MG injection Inject 30 mg into the muscle every 28 (twenty-eight) days.     . ondansetron (ZOFRAN) 4 MG tablet Take 1 tablet (4 mg total) by mouth every 8 (eight) hours as needed for nausea or vomiting. 20 tablet 0  . polyethylene glycol (MIRALAX / GLYCOLAX) packet Take 17 g by mouth daily as needed for mild constipation.    . simethicone (MYLICON) 80 MG chewable tablet Chew 80 mg by mouth every 6 (six) hours as needed for flatulence.    . testosterone cypionate (DEPOTESTOSTERONE CYPIONATE) 200 MG/ML injection Inject 1 mL (200 mg total) into the muscle every 14 (fourteen) days. 10 mL 0  . losartan (COZAAR) 25 MG tablet Take 25 mg by mouth daily.    . metoprolol succinate (TOPROL-XL) 25 MG 24 hr tablet Take 25 mg by mouth daily.      Inpatient Medications    Scheduled Meds: . docusate sodium  250 mg Oral Daily  . enoxaparin (LOVENOX) injection  40 mg Subcutaneous Daily  . ferrous sulfate  325 mg Oral BID WC  . furosemide  40 mg Intravenous BID  . gabapentin  300 mg Oral BID  . hydrocortisone sod succinate (SOLU-CORTEF) inj  50 mg Intravenous Daily  . levothyroxine  200 mcg Oral Daily  . polyethylene glycol  17 g Oral BID  . potassium chloride  40 mEq Oral BID  . senna  2 tablet Oral Daily  . sodium chloride flush  3 mL Intravenous Q12H    Continuous Infusions: . sodium chloride      PRN  Meds: sodium chloride, bisacodyl, HYDROcodone-acetaminophen, ondansetron (ZOFRAN) IV, simethicone, sodium chloride flush   ALLERGIES   Allergies  Allergen Reactions  . Oxycodone Nausea Only    ROS   Pertinent items noted in HPI and remainder of comprehensive ROS otherwise negative.  Vitals   Vitals:   03/27/18 1445 03/27/18 2216 03/28/18 0514 03/28/18 0651  BP: 121/75 115/76 119/77   Pulse: (!) 101 (!) 104 76   Resp: 16 15 15    Temp: 98.6 F (37 C) 98.5 F (36.9 C) 98.8 F (37.1 C)   TempSrc:  Oral Oral   SpO2:  95% 93%  Weight:    (!) 153.6 kg  Height:        Intake/Output Summary (Last 24 hours) at 03/28/2018 1107 Last data filed at 03/28/2018 7412 Gross per 24 hour  Intake 1560 ml  Output 1500 ml  Net 60 ml   Filed Weights   03/26/18 1425 03/27/18 0721 03/28/18 0651  Weight: 129.7 kg (!) 149.6 kg (!) 153.6 kg    Physical Exam   General appearance: alert, no distress and Significantly tall with long extremities and deep voice Neck: no carotid bruit, no JVD and thyroid not enlarged, symmetric, no tenderness/mass/nodules Lungs: clear to auscultation bilaterally Heart: regular rate and rhythm Abdomen: Mildly protuberant, firm, no fluid wave, mild tender to palpation, no rebound or guarding Extremities: edema Trace pedal edema, positive scrotal edema Pulses: 2+ and symmetric Skin: Pale, mild jaundice Neurologic: Mental status: Alert, oriented, thought content appropriate Psych: Pleasant  Labs   Results for orders placed or performed during the hospital encounter of 03/26/18 (from the past 48 hour(s))  Comprehensive metabolic panel     Status: Abnormal   Collection Time: 03/26/18  3:14 PM  Result Value Ref Range   Sodium 128 (L) 135 - 145 mmol/L   Potassium 3.6 3.5 - 5.1 mmol/L   Chloride 93 (L) 98 - 111 mmol/L   CO2 25 22 - 32 mmol/L   Glucose, Bld 113 (H) 70 - 99 mg/dL   BUN 22 (H) 6 - 20 mg/dL   Creatinine, Ser 0.75 0.61 - 1.24 mg/dL   Calcium 8.2 (L)  8.9 - 10.3 mg/dL   Total Protein 5.6 (L) 6.5 - 8.1 g/dL   Albumin 3.0 (L) 3.5 - 5.0 g/dL   AST 22 15 - 41 U/L   ALT 24 0 - 44 U/L   Alkaline Phosphatase 153 (H) 38 - 126 U/L   Total Bilirubin 3.4 (H) 0.3 - 1.2 mg/dL   GFR calc non Af Amer >60 >60 mL/min   GFR calc Af Amer >60 >60 mL/min   Anion gap 10 5 - 15    Comment: Performed at [facility], [place] [address] [place], [place] [address]  CBC with Differential     Status: Abnormal   Collection Time: 03/26/18  3:14 PM  Result Value Ref Range   WBC 10.4 4.0 - 10.5 K/uL   RBC 3.24 (L) 4.22 - 5.81 MIL/uL   Hemoglobin 10.1 (L) 13.0 - 17.0 g/dL   HCT 32.9 (L) 39.0 - 52.0 %   MCV 101.5 (H) 80.0 - 100.0 fL   MCH 31.2 26.0 - 34.0 pg   MCHC 30.7 30.0 - 36.0 g/dL   RDW 15.4 11.5 - 15.5 %   Platelets 300 150 - 400 K/uL   nRBC 0.0 0.0 - 0.2 %   Neutrophils Relative % 80 %   Neutro Abs 8.2 (H) 1.7 - 7.7 K/uL   Lymphocytes Relative 8 %   Lymphs Abs 0.9 0.7 - 4.0 K/uL   Monocytes Relative 10 %   Monocytes Absolute 1.1 (H) 0.1 - 1.0 K/uL   Eosinophils Relative 1 %   Eosinophils Absolute 0.1 0.0 - 0.5 K/uL   Basophils Relative 0 %   Basophils Absolute 0.0 0.0 - 0.1 K/uL   Immature Granulocytes 1 %   Abs Immature Granulocytes 0.08 (H) 0.00 - 0.07 K/uL    Comment: Performed at [facility], [place] [address] [place], [place] [address]  Lipase, blood     Status: None   Collection Time:  03/26/18  3:14 PM  Result Value Ref Range   Lipase 24 11 - 51 U/L    Comment: Performed at [facility], [place] [address], [place], [place] [address]  Brain natriuretic peptide     Status: Abnormal   Collection Time: 03/26/18  3:14 PM  Result Value Ref Range   B Natriuretic Peptide 105.5 (H) 0.0 - 100.0 pg/mL    Comment: Performed at [facility], [place] [address], [place], [place] [address]  Hepatitis panel, acute     Status: None   Collection Time: 03/26/18  3:14 PM  Result Value Ref  Range   Hepatitis B Surface Ag Negative Negative   HCV Ab <0.1 0.0 - 0.9 s/co ratio    Comment: (NOTE)                                  Negative:     < 0.8                             Indeterminate: 0.8 - 0.9                                  Positive:     > 0.9 The CDC recommends that a positive HCV antibody result be followed up with a HCV Nucleic Acid Amplification test (174944). Performed At: [facility] [place], [place] [id number] Rush Farmer MD [id number]    Hep A IgM Negative Negative   Hep B C IgM Negative Negative  Sodium, urine, random     Status: None   Collection Time: 03/26/18  9:29 PM  Result Value Ref Range   Sodium, Ur <10 mmol/L    Comment: Performed at [facility], [place] [address], [place], [place] [address]  Osmolality, urine     Status: None   Collection Time: 03/26/18  9:29 PM  Result Value Ref Range   Osmolality, Ur 692 300 - 900 mOsm/kg    Comment: Performed at [place] [address] [place], [place] [address]  Urinalysis, Routine w reflex microscopic     Status: Abnormal   Collection Time: 03/26/18 11:43 PM  Result Value Ref Range   Color, Urine AMBER (A) YELLOW    Comment: BIOCHEMICALS MAY BE AFFECTED BY COLOR   APPearance CLEAR CLEAR   Specific Gravity, Urine 1.041 (H) 1.005 - 1.030   pH 5.0 5.0 - 8.0   Glucose, UA NEGATIVE NEGATIVE mg/dL   Hgb urine dipstick SMALL (A) NEGATIVE   Bilirubin Urine SMALL (A) NEGATIVE   Ketones, ur 20 (A) NEGATIVE mg/dL   Protein, ur NEGATIVE NEGATIVE mg/dL   Nitrite NEGATIVE NEGATIVE   Leukocytes,Ua NEGATIVE NEGATIVE   RBC / HPF 0-5 0 - 5 RBC/hpf   WBC, UA 0-5 0 - 5 WBC/hpf   Bacteria, UA NONE SEEN NONE SEEN   Mucus PRESENT     Comment: Performed at [facility], [place] [address] [place], [place] [address]  Osmolality     Status: Abnormal   Collection Time: 03/27/18  2:18 AM  Result Value Ref Range   Osmolality 270 (L) 275 - 295  mOsm/kg    Comment: Performed at [facility], [place] [address], [place], [place] [address]  TSH  Status: Abnormal   Collection Time: 03/27/18  2:18 AM  Result Value Ref Range   TSH 0.019 (L) 0.350 - 4.500 uIU/mL    Comment: Performed by a 3rd Generation assay with a functional sensitivity of <=0.01 uIU/mL. Performed at [facility], [place] [address] [place], [place] [address]   Bilirubin, fractionated(tot/dir/indir)     Status: Abnormal   Collection Time: 03/27/18  2:18 AM  Result Value Ref Range   Total Bilirubin 4.5 (H) 0.3 - 1.2 mg/dL   Bilirubin, Direct 2.0 (H) 0.0 - 0.2 mg/dL   Indirect Bilirubin 2.5 (H) 0.3 - 0.9 mg/dL    Comment: Performed at [facility], [place] [address], [place], [place] [address]  Protime-INR     Status: Abnormal   Collection Time: 03/27/18  2:18 AM  Result Value Ref Range   Prothrombin Time 15.3 (H) 11.4 - 15.2 seconds   INR 1.2 0.8 - 1.2    Comment: (NOTE) INR goal varies based on device and disease states. Performed at [facility], [place] [address] [place], [place] [address]   Comprehensive metabolic panel     Status: Abnormal   Collection Time: 03/27/18  2:18 AM  Result Value Ref Range   Sodium 129 (L) 135 - 145 mmol/L   Potassium 3.6 3.5 - 5.1 mmol/L   Chloride 95 (L) 98 - 111 mmol/L   CO2 22 22 - 32 mmol/L   Glucose, Bld 110 (H) 70 - 99 mg/dL   BUN 18 6 - 20 mg/dL   Creatinine, Ser 0.64 0.61 - 1.24 mg/dL   Calcium 8.0 (L) 8.9 - 10.3 mg/dL   Total Protein 5.6 (L) 6.5 - 8.1 g/dL   Albumin 3.2 (L) 3.5 - 5.0 g/dL   AST 32 15 - 41 U/L   ALT 38 0 - 44 U/L   Alkaline Phosphatase 162 (H) 38 - 126 U/L   Total Bilirubin 4.5 (H) 0.3 - 1.2 mg/dL   GFR calc non Af Amer >60 >60 mL/min   GFR calc Af Amer >60 >60 mL/min   Anion gap 12 5 - 15    Comment: Performed at [facility], [place] [address] [place], [place] [address]  HIV antibody (Routine Testing)     Status: None    Collection Time: 03/27/18  2:18 AM  Result Value Ref Range   HIV Screen 4th Generation wRfx Non Reactive Non Reactive    Comment: (NOTE) Performed At: [facility] [place], [place] [id number] Rush Farmer MD VF:[id number]   CBC     Status: Abnormal   Collection Time: 03/27/18  2:18 AM  Result Value Ref Range   WBC 9.3 4.0 - 10.5 K/uL   RBC 3.12 (L) 4.22 - 5.81 MIL/uL   Hemoglobin 9.8 (L) 13.0 - 17.0 g/dL   HCT 31.2 (L) 39.0 - 52.0 %   MCV 100.0 80.0 - 100.0 fL   MCH 31.4 26.0 - 34.0 pg   MCHC 31.4 30.0 - 36.0 g/dL   RDW 15.3 11.5 - 15.5 %   Platelets 243 150 - 400 K/uL   nRBC 0.0 0.0 - 0.2 %    Comment: Performed at [facility], [place] [address], [place], [place] [address]  Creatinine, serum     Status: None   Collection Time: 03/27/18  2:18 AM  Result Value Ref Range   Creatinine, Ser 0.65 0.61 - 1.24 mg/dL   GFR calc non Af Amer >60 >60  mL/min   GFR calc Af Amer >60 >60 mL/min    Comment: Performed at [facility], [place] [address] [place], [place] [address]  Protein / creatinine ratio, urine     Status: Abnormal   Collection Time: 03/27/18  4:24 AM  Result Value Ref Range   Creatinine, Urine 24.75 mg/dL   Total Protein, Urine <6 mg/dL   Protein Creatinine Ratio 0.16 (H) 0.00 - 0.15 mg/mg[Cre]    Comment: Performed at [facility], [place] [address] [place], [place] [address]  Bilirubin, fractionated(tot/dir/indir)     Status: Abnormal   Collection Time: 03/27/18  2:28 PM  Result Value Ref Range   Total Bilirubin 3.7 (H) 0.3 - 1.2 mg/dL   Bilirubin, Direct 1.3 (H) 0.0 - 0.2 mg/dL   Indirect Bilirubin 2.4 (H) 0.3 - 0.9 mg/dL    Comment: Performed at [facility], [place] [address] [place], [place] [address]  T4, free     Status: Abnormal   Collection Time: 03/27/18  2:28 PM  Result Value Ref Range   Free T4 0.71 (L) 0.82 - 1.77 ng/dL    Comment: (NOTE) Biotin ingestion may  interfere with free T4 tests. If the results are inconsistent with the TSH level, previous test results, or the clinical presentation, then consider biotin interference. If needed, order repeat testing after stopping biotin. Performed at [facility], [place] [address], [place], [place] [address]   Comprehensive metabolic panel     Status: Abnormal   Collection Time: 03/28/18  4:07 AM  Result Value Ref Range   Sodium 131 (L) 135 - 145 mmol/L   Potassium 3.0 (L) 3.5 - 5.1 mmol/L   Chloride 97 (L) 98 - 111 mmol/L   CO2 25 22 - 32 mmol/L   Glucose, Bld 128 (H) 70 - 99 mg/dL   BUN 13 6 - 20 mg/dL   Creatinine, Ser 0.71 0.61 - 1.24 mg/dL   Calcium 7.9 (L) 8.9 - 10.3 mg/dL   Total Protein 5.0 (L) 6.5 - 8.1 g/dL   Albumin 2.8 (L) 3.5 - 5.0 g/dL   AST 21 15 - 41 U/L   ALT 35 0 - 44 U/L   Alkaline Phosphatase 130 (H) 38 - 126 U/L   Total Bilirubin 2.1 (H) 0.3 - 1.2 mg/dL   GFR calc non Af Amer >60 >60 mL/min   GFR calc Af Amer >60 >60 mL/min   Anion gap 9 5 - 15    Comment: Performed at [facility], [place] [address], [place], [place] [address]  CBC     Status: Abnormal   Collection Time: 03/28/18  4:07 AM  Result Value Ref Range   WBC 8.0 4.0 - 10.5 K/uL   RBC 2.77 (L) 4.22 - 5.81 MIL/uL   Hemoglobin 8.8 (L) 13.0 - 17.0 g/dL   HCT 27.7 (L) 39.0 - 52.0 %   MCV 100.0 80.0 - 100.0 fL   MCH 31.8 26.0 - 34.0 pg   MCHC 31.8 30.0 - 36.0 g/dL   RDW 15.9 (H) 11.5 - 15.5 %   Platelets 277 150 - 400 K/uL   nRBC 0.0 0.0 - 0.2 %    Comment: Performed at [facility], [place] [address], [place], [place] [address]  Lactate dehydrogenase     Status: Abnormal   Collection Time: 03/28/18  4:07 AM  Result Value Ref Range   LDH 213 (H) 98 - 192 U/L    Comment:  Performed at [facility], [place] [address] [place], [place] [address]    ECG   Sinus tachycardia with multifocal PVCs, RBBB- Personally Reviewed  Telemetry   N/A  Radiology    Dg Chest 2 View  Result Date: 03/26/2018 CLINICAL DATA:  Lower extremity edema and shortness of breath EXAM: CHEST - 2 VIEW COMPARISON:  None. FINDINGS: There is left basilar atelectasis. Cardiomediastinal contours are normal. No pleural effusion or pneumothorax. No pulmonary edema or focal airspace consolidation. IMPRESSION: Left basilar atelectasis. Electronically Signed   By: Ulyses Jarred M.D.   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 Multiplanar reconstructed images and MIPs were obtained and reviewed to evaluate the vascular anatomy. CONTRAST:  125mL OMNIPAQUE IOHEXOL 350 MG/ML SOLN COMPARISON:  03/26/2018 FINDINGS: VASCULAR Aorta: Intact aorta and normal caliber. Negative for aneurysm, dissection, occlusive process or retroperitoneal hemorrhage. Celiac: Remains patent with slight proximal tortuosity. Splenic, left gastric, and hepatic vasculature all patent. SMA: Widely patent including its branches Renals: Normal appearing right renal artery. No accessory renal vasculature. Left renal artery is widely patent and enlarged. Within the left hilum, there is an abnormal tortuous vascular structure with some scattered wall calcifications measuring 3.1 x 4.9 cm. There is and associated very large dilated draining renal vein. Renal vein is also circumaortic in configuration. Appearance is compatible with a chronic appearing left renal vascular malformation, most consistent with a renal AVF. No evidence of acute bleeding or embolic process. No renal infarcts. IMA: Widely patent off the distal aorta including its branches Inflow: Not assessed Veins: No veno-occlusive process Review of the MIP images confirms the above findings.  NON-VASCULAR Lower chest: Bibasilar atelectasis and small pleural effusions. Hepatobiliary: No focal liver abnormality is seen. No gallstones, gallbladder wall thickening, or biliary dilatation. Pancreas: Unremarkable. No pancreatic ductal dilatation or surrounding inflammatory changes. Spleen: Normal in size without focal abnormality. Adrenals/Urinary Tract: Normal adrenal glands. No renal obstruction, hydronephrosis, acute obstructive uropathy. No hydroureter. Left renal hilum vascular malformation as described above. Stomach/Bowel: Similar moderate colonic distension and large volume of retained stool in the right colon. Small bowel nondilated. No definite obstruction pattern. No free air, fluid collection, abscess, or ascites. Lymphatic: No bulky adenopathy. Other: Intact abdominal wall. No hernia. Body subcutaneous edema evident compatible with anasarca. Musculoskeletal: Degenerative changes of the spine. 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 No evidence of acute arterial bleeding or renal embolic process. NON-VASCULAR No other acute intra-abdominal vascular finding. Nonspecific colonic distension Bibasilar atelectasis and trace pleural effusions Body anasarca Electronically Signed   By: Jerilynn Mages.  Shick M.D.   On: 03/28/2018 09:05   Ct Abdomen Pelvis W Contrast  Result Date: 03/26/2018 CLINICAL DATA:  Postop right hip arthroplasty on 03/16/2018. Patient presents with bilateral leg edema and fluid retention. Soft tissue edema diffusely. EXAM: CT ABDOMEN AND PELVIS WITH CONTRAST TECHNIQUE: Multidetector CT imaging of the abdomen and pelvis was performed using the standard protocol following bolus administration of intravenous contrast. CONTRAST:  145mL ISOVUE-300 IOPAMIDOL (ISOVUE-300) INJECTION 61% COMPARISON:  Lumbar spine MRI 01/21/2018 FINDINGS: Lower  chest: Borderline cardiomegaly without pericardial effusion. Small hiatal hernia.  Bibasilar atelectasis left greater than right. Trace bilateral pleural effusions. Hepatobiliary: No focal liver abnormality is seen. No gallstones, gallbladder wall thickening, or biliary dilatation. Pancreas: Unremarkable. No pancreatic ductal dilatation or surrounding inflammatory changes. Spleen: Normal in size without focal abnormality. Adrenals/Urinary Tract: Normal bilateral adrenal glands. No nephrolithiasis is identified no obstructive uropathy or enhancing renal mass. The urinary bladder is physiologically distended without focal mural thickening or calculi. Stomach/Bowel: There is a small hiatal hernia. Contrast is noted within the stomach without significant distention. The duodenal bulb, sweep and ligament of Treitz are normal. No significant small bowel dilatation is seen. Moderate to large amount of retained stool is seen within the right colon with gaseous distention of the transverse and proximal descending colon. No mechanical large bowel obstruction is noted. Vascular/Lymphatic: There is an unusual appearance of the left renal vein which is slightly hyperdense and dilated in appearance up to 3.2 cm AP. Similarly the left renal artery is prominent and dilated up to 1 cm in diameter. There are coarse calcifications noted in the left renal hilum and the possibility of an renal AV fistula or malformation accounting for this appearance is raised. The left renal vein is circumaortic. The right renal vein and artery are unremarkable. The IVC is flat and nondistended. No lymphadenopathy. Reproductive: Normal size prostate and seminal vesicles. Other: Diffuse subcutaneous soft tissue edema consistent with anasarca is noted of the body wall and extending into the thighs. Moderate free fluid in the pelvis. Musculoskeletal: Susceptibility artifacts from the patient's right hip arthroplasty with postop change accounting for a few  dots of air along the anterior aspect of the thigh are identified. Faint hyperdensities noted within the thigh musculature are noted on series 2/119 felt secondary to streak artifacts and less likely due to hemorrhage. No hyperdense fluid is seen within the musculature or significant hematoma. Chronic anterior compression of T12. Multilevel thoracolumbar degenerative disc disease. IMPRESSION: 1. Unusually dilated left renal vein and left renal artery with dystrophic calcifications in the left renal hilum may represent the presence of a renal AV fistula or malformation. Conceivably this could contribute to heart failure and contribute to the development of soft tissue edema/anasarca and free fluid within the pelvis. Suggest CT angiogram of the abdomen to rule out a left renal AVF. 2. Postop change of the right thigh status post hip arthroplasty with postop soft tissue tiny foci of emphysema and edema. 3. Trace bilateral pleural effusions with atelectasis. Borderline cardiomegaly. Electronically Signed   By: Ashley Royalty M.D.   On: 03/26/2018 22:20    Cardiac Studies   Echo 03/27/2018  1. The left ventricle has normal systolic function, with an ejection fraction of 55-60%. The cavity size was normal. Left ventricular diastolic Doppler parameters are indeterminate.  2. Small pericardial effusion.  3. The pericardial effusion is posterior to the left ventricle (small) and surrounding the apex (moderate).  4. The aortic valve is tricuspid. Mild aortic annular calcification noted. No definitive aortic regurgitation.  5. The mitral valve is normal in structure. Mild thickening of the mitral valve leaflet.  6. There is moderate to severe dilatation of the aortic root. Measures 51 mm in the parasternal views. Suggest further imaging if not already assessed (CTA).   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 Clinical picture is consistent with whole body fluid overload with likely intravascular volume depletion.  BNP is low and although the IVC was not evaluated by echo, it was seen on CT angios of the abdomen and noted to be collapsed.  On physical exam, his edema is primarily scrotal and pelvic which is likely mostly postoperative, related to hip surgery or possibly blood loss which was noted.  JVP is not elevated and his lungs are clear.  Overall findings are not consistent with heart failure.  Although the right heart was not well visualized, I suspect that this is not right heart failure.  We will get a limited echo with contrast today to evaluate the right heart.  In addition he was noted to have a severely dilated aortic root without definitive aortic regurgitation.  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  The proximal ascending aorta was not well visualized.  CT angiogram of the aortic root and a sending aorta is recommended at some point.  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  I am not sure how this may be contributing to his symptoms.  I would more likely attribute his anasarca picture to hypoalbuminemia and high-dose stress steroids, in combination with postoperative fluid resuscitation that has not mobilized.  It is surprising that he has very little lower extremity edema.  Agree with the plan for diuretics and possibly additional albumin.  Thanks for the consultation.  Cardiology will follow-up his echo tomorrow.  Time Spent Directly with Patient:  I have spent a total of 75 minutes with the patient reviewing hospital notes, telemetry, EKGs, labs and examining the patient as well as establishing an assessment and plan that was discussed  personally with the patient.  > 50% of time was spent in direct patient care.  Length of Stay:  LOS: 2 days   Pixie Casino, MD, [facility], [place] Director of the [facility] Lipid [facility] &  Card[facility] Diplomate of the American Board of Clinical Lipidology Attending Cardiologist  Direct Dial: [phone]  Fax: [phone]  Website:  [email or web address] Elowyn Raupp 03/28/2018, 11:07 AM

## 2018-03-28 NOTE — Progress Notes (Signed)
PROGRESS NOTE  Mark Gallagher  TMH:[id number] DOB: 1970-08-06 DOA: 03/26/2018 PCP: Wendie Agreste, MD  Outpatient Specialists: Dr. Pleas Patricia, cardiology; Dr. Jerl Mina and Manley Mason, [place] endocrinology; Dr. Derrill Memo, neurosurgery/oncology; Dr. Agapito Games, orthopedics Brief Narrative: Mark Gallagher is a 48 y.o. 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 He had been admitted 2/25 for routine right THA by Dr. Percell Miller. Despite empiric stress steroids, he was hypotensive postoperatively, EBL mentioned to be 1.1L, started on neo, given 2u PRBCs and albumin, and transferred to the ICU. Stress steroids were continued and ultimately he was weaned from pressors, required another unit of PRBCs, but ultimately anemia stabilized. Hydrocortisone was weaned from 2/29 through 3/2. He had leg swelling, LUE swelling which was worked up with negative doppler U/S, but by the day of discharge he was up about 30lbs (276lbs on 3/3 and documented 242lbs at office visit on 2/18). He was discharged 3/3, holding losartan and metoprolol pending cardiology follow up, placed on aspirin 81mg  BID for DVT prophylaxis and back on home hydrocortisone 10mg  daily in addition to aleve, hydrocodone, and robaxin with home medications. He was feeling ok at discharge, had had a BM, but had poor appetite, noticed continued swelling. He was going to follow up with cardiology in [place] but felt too nauseated to travel, so followed up with his PCP 3/6 where he reported continued swelling, up to 285lbs, referred to the ED.   In the ED he appeared with anasarca slight jaundice, afebrile, with hyponatremia at 128 down from 138 at recent discharge, creatinine stable, WBC 10.4k, Hgb 10.1g/dl up from  8.1g/dl at recent discharge. Albumin was 3, total bilirubin was 3.4 with normal AST, ALT, and modestly elevated alkaline phosphatase at 153. Urinalysis showed elevated specific gravity with urine sodium < 10, 0-5 WBCs and RBCs on microscopy, there were ketones but no protein or bacteria. CT abd/pelvis demonstrated diffuse anasarca, borderline cardiomegaly with trace pleural effusions, normal hepatobiliary system, flat IVC, moderate-large stool burden, and abnormal dilation of the left renal artery and vein. He was admitted for further work up, restarted on stress dose steroids.  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 Slightly hypoalbuminemic which is likely contributing. Hepatobiliary structure normal on CT, no proteinuria on urinalysis. No DVT on U/S previously. - Echocardiogram not convincing for CHF. Discussed with Dr. Debara Pickett for assistance with diuresis recommendations. Will get definity-enhanced limited echo for RV. - Deescalate steroids. Will decrease further 3/9, watching closely for adrenal crisis. Will discuss this further with pt's endocrinologist, Dr. Almyra Free. - Start lasix and monitor UOP. If not diuresing, would need albumin to help mobilize extravascular fluid.  - Daily weights, strict I/O  Nausea, vomiting: Uncertain, could be due to overly rapid tapering of steroids or conversely due to the previous protracted stress dose steroids causing fluid retention and inta-abdominal swelling - Encourage po, especially protein intake.  - Antiemetics prn. - Discussed that TPN is currently not indicated.  Constipation: Resolved.  - Continue regular bowel  regimen.  Panhypopituitarism  s/p pituitary macroadenoma resection:  - Continue testosterone 100mg  q2weeks at PCP (last 3/6) - Continue synthroid 216mcg daily for now. TSH is low as would be expected, free T4 also low, possibly related to sick syndrome. Will discuss with endocrinology.  - Continue octreotide 30mg  q28d - Has remained normotensive prior to stress steroids. Hyponatremia noted without hyperkalemia or metabolic acidosis. I do not believe he currently requires stress steroids, decreasing dose as above.   Hyperbilirubinemia: Cholestatic pattern with normal AST, ALT, though no hepatobiliary ductal dilatation or parenchymal abnormalities on CT. Urine has been darker.  - Fractionated bilirubin with mixed picture, overall improving. Doubt hemolysis based on stable hgb, platelets.  - ?Due to congestive hepatopathy. Will continue monitoring daily.  Left renal vascular malformation/fistula: Abnormal imaging at admission. Discussed with Dr. Anselm Pancoast who reviewed images, recommended CTA which demonstrated enlarged renal vein with contrast present during arterial phase consistent with AVF. No recent instrumentation. Discussed with vascular surgery, Dr. Carlis Abbott, who recommends just monitoring for now in the setting of normal kidney function, no hematuria/proetinuria. Suspicion is that this is a chronic finding.  - Renal artery duplex ordered per discussion with vascular surgery to get an idea of flow. An indication for intervention may be refractory CHF due to the AVM.  Aortic root dilatation: 5.1cm on echo 03/27/2018. On review of echo report from [place] 11/05/2016, 5.1cm aorta sin. is noted; noted to be 5.5cm 08/07/2015. Severe cardiac enlargement noted as well with EF 50%, normal RV. Also referred to in report of CTA chest 09/12/2005 "prominence of the ascending aorta at the sinuses of Valsalva."  - Will need surveillance. Discussed with family.  Hyponatremia: Multifactorial, though with hypoosmolar state and  low UNa, suspect due primarily to dehydration in setting of N/V/poor po. Possibly related to opioids, pain.  - Intravascularly depleted but peripherally overloaded, so will not give IVF's at this time.  - Monitor with AM labs during diuresis  Acute blood loss anemia: Stable/improved from prior admission.  - Monitor for bleeding - Trend CBC.  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   History of AFlutter, WPW: Currently in sinus rhythm.  - Monitor HR. Do not feel telemetry currently needed.  Cervical spondylosis: s/p fusions x2, no current myelopathy.  - Continue pain medications prn - Kpad prn  DVT prophylaxis: Lovenox Code Status: Full Family Communication: Mother at bedside this AM Disposition Plan: Likely DC home once volume status improving and work up completed.  Consultants:   Radiology, Dr. Anselm Pancoast 3/7.  Cardiology, Dr. Debara Pickett.  Vascular surgery, Dr. Carlis Abbott 3/8.  Procedures:   Echocardiogram 03/27/2018:  1. The left ventricle has normal systolic function, with an ejection fraction of 55-60%. The cavity size was normal. Left ventricular diastolic Doppler parameters are indeterminate.  2. Small pericardial effusion.  3. The pericardial effusion is posterior to the left ventricle (small) and surrounding the apex (moderate).  4. The aortic valve is tricuspid. Mild aortic annular calcification noted. No definitive aortic regurgitation.  5. The mitral valve is normal in structure. Mild thickening of the mitral valve leaflet.  6. There is moderate to severe dilatation of the aortic root. Measures 51 mm in the parasternal views. Suggest further imaging if not already assessed (CTA).  Antimicrobials:  None   Subjective: Had relief of constipation last night with diarrhea in the bed, also had condom cath dislodge. Did  not get good sleep. Was not a good night.  Denies dyspnea or chest pain. No change in UOP.  Objective: Vitals:   03/27/18 1445 03/27/18 2216 03/28/18 0514 03/28/18 0651  BP: 121/75 115/76 119/77   Pulse: (!) 101 (!) 104 76   Resp: 16 15 15    Temp: 98.6 F (37 C) 98.5 F (36.9 C) 98.8 F (37.1 C)   TempSrc:  Oral Oral   SpO2:  95% 93%   Weight:    (!) 153.6 kg  Height:        Intake/Output Summary (Last 24 hours) at 03/28/2018 1547 Last data filed at 03/28/2018 1541 Gross per 24 hour  Intake 1680 ml  Output 3500 ml  Net -1820 ml   Filed Weights   03/26/18 1425 03/27/18 0721 03/28/18 0651  Weight: 129.7 kg (!) 149.6 kg (!) 153.6 kg   Gen: Acromegalic, pleasant male in no distress Pulm: Nonlabored breathing room air. Clear. CV: Regular rate and rhythm. No murmur, rub, or gallop. No JVD, 3+ nonpitting dependent edema. GI: Abdomen firm, uncomfortable diffusely without rebound or guarding. +BS.  Ext: Warm, no deformities. RLE warm, sensorimotor exam intact. Skin: No new rashes, lesions or ulcers on visualized skin. Neuro: Alert and oriented. Weak on his feet, no focal neurological deficits. Psych: Judgement and insight appear fair. Mood euthymic & affect congruent. Behavior is appropriate.    Data Reviewed: I have personally reviewed following labs and imaging studies  CBC: Recent Labs  Lab 03/22/18 0357 03/26/18 1212 03/26/18 1514 03/27/18 0218 03/28/18 0407  WBC 7.9 11.7* 10.4 9.3 8.0  NEUTROABS  --   --  8.2*  --   --   HGB 8.1* 10.2* 10.1* 9.8* 8.8*  HCT 27.2* 30.4 32.9* 31.2* 27.7*  MCV 102.6* 95.5 101.5* 100.0 100.0  PLT 154  --  300 243 161   Basic Metabolic Panel: Recent Labs  Lab 03/22/18 0357 03/26/18 1514 03/27/18 0218 03/28/18 0407  NA 138 128* 129* 131*  K 3.7 3.6 3.6 3.0*  CL 105 93* 95* 97*  CO2 28 25 22 25   GLUCOSE 115* 113* 110* 128*  BUN 21* 22* 18 13  CREATININE 0.65 0.75 0.64  0.65 0.71  CALCIUM 8.1* 8.2* 8.0* 7.9*   GFR: Estimated Creatinine Clearance: 185.8 mL/min (by C-G  formula based on SCr of 0.71 mg/dL). Liver Function Tests: Recent Labs  Lab 03/26/18 1514 03/27/18 0218 03/27/18 1428 03/28/18 0407  AST 22 32  --  21  ALT 24 38  --  35  ALKPHOS 153* 162*  --  130*  BILITOT 3.4* 4.5*  4.5* 3.7* 2.1*  PROT 5.6* 5.6*  --  5.0*  ALBUMIN 3.0* 3.2*  --  2.8*   Recent Labs  Lab 03/26/18 1514  LIPASE 24   No results for input(s): AMMONIA in the last 168 hours. Coagulation Profile: Recent Labs  Lab 03/27/18 0218  INR 1.2   Cardiac Enzymes: No results for input(s): CKTOTAL, CKMB, CKMBINDEX, TROPONINI in the last 168 hours. BNP (last 3 results) No results for input(s): PROBNP in the last 8760 hours. HbA1C: No results for input(s): HGBA1C in the last 72 hours. CBG: Recent Labs  Lab 03/22/18 2111 03/23/18 0036 03/23/18 0434 03/23/18 0744 03/23/18 1234  GLUCAP 134* 104* 100* 97 103*   Lipid Profile: No results for input(s): CHOL, HDL, LDLCALC, TRIG, CHOLHDL, LDLDIRECT in the last 72 hours. Thyroid Function Tests: Recent Labs    03/27/18 0218 03/27/18 1428  TSH 0.019*  --  FREET4  --  0.71*   Anemia Panel: No results for input(s): VITAMINB12, FOLATE, FERRITIN, TIBC, IRON, RETICCTPCT in the last 72 hours. Urine analysis:    Component Value Date/Time   COLORURINE AMBER (A) 03/26/2018 2343   APPEARANCEUR CLEAR 03/26/2018 2343   LABSPEC 1.041 (H) 03/26/2018 2343   PHURINE 5.0 03/26/2018 2343   GLUCOSEU NEGATIVE 03/26/2018 2343   HGBUR SMALL (A) 03/26/2018 2343   BILIRUBINUR SMALL (A) 03/26/2018 2343   KETONESUR 20 (A) 03/26/2018 2343   PROTEINUR NEGATIVE 03/26/2018 2343   NITRITE NEGATIVE 03/26/2018 2343   LEUKOCYTESUR NEGATIVE 03/26/2018 2343   No results found for this or any previous visit (from the past 240 hour(s)).    Radiology Studies: Dg Chest 2 View  Result Date: 03/26/2018 CLINICAL DATA:  Lower extremity edema and shortness of breath EXAM: CHEST - 2 VIEW COMPARISON:  None. FINDINGS: There is left basilar  atelectasis. Cardiomediastinal contours are normal. No pleural effusion or pneumothorax. No pulmonary edema or focal airspace consolidation. IMPRESSION: Left basilar atelectasis. Electronically Signed   By: Ulyses Jarred M.D.   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 Multiplanar reconstructed images and MIPs were obtained and reviewed to evaluate the vascular anatomy. CONTRAST:  159mL OMNIPAQUE IOHEXOL 350 MG/ML SOLN COMPARISON:  03/26/2018 FINDINGS: VASCULAR Aorta: Intact aorta and normal caliber. Negative for aneurysm, dissection, occlusive process or retroperitoneal hemorrhage. Celiac: Remains patent with slight proximal tortuosity. Splenic, left gastric, and hepatic vasculature all patent. SMA: Widely patent including its branches Renals: Normal appearing right renal artery. No accessory renal vasculature. Left renal artery is widely patent and enlarged. Within the left hilum, there is an abnormal tortuous vascular structure with some scattered wall calcifications measuring 3.1 x 4.9 cm. There is and associated very large dilated draining renal vein. Renal vein is also circumaortic in configuration. Appearance is compatible with a chronic appearing left renal vascular malformation, most consistent with a renal AVF. No evidence of acute bleeding or embolic process. No renal infarcts. IMA: Widely patent off the distal aorta including its branches Inflow: Not assessed Veins: No veno-occlusive process Review of the MIP images confirms the above findings. NON-VASCULAR Lower chest: Bibasilar atelectasis and small pleural effusions. Hepatobiliary: No focal liver abnormality is seen. No gallstones, gallbladder wall thickening, or biliary  dilatation. Pancreas: Unremarkable. No pancreatic ductal dilatation or surrounding inflammatory changes. Spleen: Normal in size without focal abnormality. Adrenals/Urinary Tract: Normal adrenal glands. No renal obstruction, hydronephrosis, acute obstructive uropathy. No hydroureter. Left renal hilum vascular malformation as described above. Stomach/Bowel: Similar moderate colonic distension and large volume of retained stool in the right colon. Small bowel nondilated. No definite obstruction pattern. No free air, fluid collection, abscess, or ascites. Lymphatic: No bulky adenopathy. Other: Intact abdominal wall. No hernia. Body subcutaneous edema evident compatible with anasarca. Musculoskeletal: Degenerative changes of the spine. 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 No evidence of acute arterial bleeding or renal embolic process. NON-VASCULAR No other acute intra-abdominal vascular finding. Nonspecific colonic distension Bibasilar atelectasis and trace pleural effusions Body anasarca Electronically Signed   By: Jerilynn Mages.  Shick M.D.   On: 03/28/2018  09:05   Ct Abdomen Pelvis W Contrast  Result Date: 03/26/2018 CLINICAL DATA:  Postop right hip arthroplasty on 03/16/2018. Patient presents with bilateral leg edema and fluid retention. Soft tissue edema diffusely. EXAM: CT ABDOMEN AND PELVIS WITH CONTRAST TECHNIQUE: Multidetector CT imaging of the abdomen and pelvis was performed using the standard protocol following bolus administration of intravenous contrast. CONTRAST:  184mL ISOVUE-300 IOPAMIDOL (ISOVUE-300) INJECTION 61% COMPARISON:  Lumbar spine MRI 01/21/2018 FINDINGS: Lower chest: Borderline cardiomegaly without pericardial effusion. Small hiatal hernia. Bibasilar atelectasis left greater than right. Trace bilateral pleural effusions. Hepatobiliary: No  focal liver abnormality is seen. No gallstones, gallbladder wall thickening, or biliary dilatation. Pancreas: Unremarkable. No pancreatic ductal dilatation or surrounding inflammatory changes. Spleen: Normal in size without focal abnormality. Adrenals/Urinary Tract: Normal bilateral adrenal glands. No nephrolithiasis is identified no obstructive uropathy or enhancing renal mass. The urinary bladder is physiologically distended without focal mural thickening or calculi. Stomach/Bowel: There is a small hiatal hernia. Contrast is noted within the stomach without significant distention. The duodenal bulb, sweep and ligament of Treitz are normal. No significant small bowel dilatation is seen. Moderate to large amount of retained stool is seen within the right colon with gaseous distention of the transverse and proximal descending colon. No mechanical large bowel obstruction is noted. Vascular/Lymphatic: There is an unusual appearance of the left renal vein which is slightly hyperdense and dilated in appearance up to 3.2 cm AP. Similarly the left renal artery is prominent and dilated up to 1 cm in diameter. There are coarse calcifications noted in the left renal hilum and the possibility of an renal AV fistula or malformation accounting for this appearance is raised. The left renal vein is circumaortic. The right renal vein and artery are unremarkable. The IVC is flat and nondistended. No lymphadenopathy. Reproductive: Normal size prostate and seminal vesicles. Other: Diffuse subcutaneous soft tissue edema consistent with anasarca is noted of the body wall and extending into the thighs. Moderate free fluid in the pelvis. Musculoskeletal: Susceptibility artifacts from the patient's right hip arthroplasty with postop change accounting for a few dots of air along the anterior aspect of the thigh are identified. Faint hyperdensities noted within the thigh musculature are noted on series 2/119 felt secondary to streak  artifacts and less likely due to hemorrhage. No hyperdense fluid is seen within the musculature or significant hematoma. Chronic anterior compression of T12. Multilevel thoracolumbar degenerative disc disease. IMPRESSION: 1. Unusually dilated left renal vein and left renal artery with dystrophic calcifications in the left renal hilum may represent the presence of a renal AV fistula or malformation. Conceivably this could contribute to heart failure and contribute to the development of soft tissue edema/anasarca and free fluid within the pelvis. Suggest CT angiogram of the abdomen to rule out a left renal AVF. 2. Postop change of the right thigh status post hip arthroplasty with postop soft tissue tiny foci of emphysema and edema. 3. Trace bilateral pleural effusions with atelectasis. Borderline cardiomegaly. Electronically Signed   By: Ashley Royalty M.D.   On: 03/26/2018 22:20    Scheduled Meds: . docusate sodium  250 mg Oral Daily  . enoxaparin (LOVENOX) injection  40 mg Subcutaneous Daily  . ferrous sulfate  325 mg Oral BID WC  . furosemide  40 mg Intravenous BID  . gabapentin  300 mg Oral BID  . hydrocortisone sod succinate (SOLU-CORTEF) inj  50 mg Intravenous Daily  . levothyroxine  200 mcg Oral Daily  . nitroGLYCERIN      .  polyethylene glycol  17 g Oral BID  . potassium chloride  40 mEq Oral BID  . senna  2 tablet Oral Daily  . sodium chloride flush  3 mL Intravenous Q12H   Continuous Infusions: . sodium chloride       LOS: 2 days   Time spent: 35 minutes.  Patrecia Pour, MD [facility] [email or web address] Password [facility] 03/28/2018, 3:47 PM

## 2018-03-29 ENCOUNTER — Inpatient Hospital Stay (HOSPITAL_COMMUNITY): Payer: BLUE CROSS/BLUE SHIELD

## 2018-03-29 DIAGNOSIS — E871 Hypo-osmolality and hyponatremia: Principal | ICD-10-CM

## 2018-03-29 DIAGNOSIS — Q2730 Arteriovenous malformation, site unspecified: Secondary | ICD-10-CM

## 2018-03-29 DIAGNOSIS — R601 Generalized edema: Secondary | ICD-10-CM

## 2018-03-29 LAB — COMPREHENSIVE METABOLIC PANEL
ALT: 31 U/L (ref 0–44)
AST: 18 U/L (ref 15–41)
Albumin: 3 g/dL — ABNORMAL LOW (ref 3.5–5.0)
Alkaline Phosphatase: 125 U/L (ref 38–126)
Anion gap: 9 (ref 5–15)
BUN: 11 mg/dL (ref 6–20)
CO2: 30 mmol/L (ref 22–32)
Calcium: 7.9 mg/dL — ABNORMAL LOW (ref 8.9–10.3)
Chloride: 95 mmol/L — ABNORMAL LOW (ref 98–111)
Creatinine, Ser: 0.62 mg/dL (ref 0.61–1.24)
GFR calc Af Amer: >60 mL/min (ref >60)
GFR calc non Af Amer: >60 mL/min (ref >60)
Glucose, Bld: 99 mg/dL (ref 70–99)
Potassium: 3.2 mmol/L — ABNORMAL LOW (ref 3.5–5.1)
Sodium: 134 mmol/L — ABNORMAL LOW (ref 135–145)
Total Bilirubin: 2.3 mg/dL — ABNORMAL HIGH (ref 0.3–1.2)
Total Protein: 5.3 g/dL — ABNORMAL LOW (ref 6.5–8.1)

## 2018-03-29 LAB — CBC
HCT: 28.7 % — ABNORMAL LOW (ref 39.0–52.0)
Hemoglobin: 8.8 g/dL — ABNORMAL LOW (ref 13.0–17.0)
MCH: 31.1 pg (ref 26.0–34.0)
MCHC: 30.7 g/dL (ref 30.0–36.0)
MCV: 101.4 fL — ABNORMAL HIGH (ref 80.0–100.0)
Platelets: 273 10*3/uL (ref 150–400)
RBC: 2.83 MIL/uL — ABNORMAL LOW (ref 4.22–5.81)
RDW: 16 % — ABNORMAL HIGH (ref 11.5–15.5)
WBC: 6 10*3/uL (ref 4.0–10.5)
nRBC: 0 % (ref 0.0–0.2)

## 2018-03-29 MED ORDER — MAGNESIUM HYDROXIDE 400 MG/5ML PO SUSP
30.0000 mL | Freq: Once | ORAL | Status: AC
  Administered 2018-03-29: 30 mL via ORAL
  Filled 2018-03-29: qty 30

## 2018-03-29 MED ORDER — ENOXAPARIN SODIUM 80 MG/0.8ML ~~LOC~~ SOLN
80.0000 mg | Freq: Every day | SUBCUTANEOUS | Status: DC
  Administered 2018-03-30 – 2018-04-01 (×3): 80 mg via SUBCUTANEOUS
  Filled 2018-03-29 (×3): qty 0.8

## 2018-03-29 MED ORDER — HYDROCORTISONE 20 MG PO TABS: 20.0000 mg | ORAL_TABLET | Freq: Every day | ORAL | Status: DC

## 2018-03-29 MED ORDER — HYDROCORTISONE 10 MG PO TABS
10.0000 mg | ORAL_TABLET | Freq: Every day | ORAL | Status: DC
  Administered 2018-03-30 – 2018-04-01 (×3): 10 mg via ORAL
  Filled 2018-03-29 (×3): qty 1

## 2018-03-29 MED ORDER — METHOCARBAMOL 1000 MG/10ML IJ SOLN
500.0000 mg | Freq: Three times a day (TID) | INTRAVENOUS | Status: DC | PRN
  Filled 2018-03-29 (×2): qty 5

## 2018-03-29 MED ORDER — LIP MEDEX EX OINT
TOPICAL_OINTMENT | CUTANEOUS | Status: AC
  Filled 2018-03-29: qty 7

## 2018-03-29 MED ORDER — MUSCLE RUB 10-15 % EX CREA
TOPICAL_CREAM | CUTANEOUS | Status: DC | PRN
  Administered 2018-03-31 (×2): via TOPICAL
  Filled 2018-03-29: qty 85

## 2018-03-29 MED ORDER — METHOCARBAMOL 500 MG PO TABS
500.0000 mg | ORAL_TABLET | Freq: Three times a day (TID) | ORAL | Status: DC | PRN
  Administered 2018-03-29 – 2018-03-31 (×5): 500 mg via ORAL
  Filled 2018-03-29 (×5): qty 1

## 2018-03-29 NOTE — Progress Notes (Signed)
Pharmacy: [facility] for VTE px Dose adjusted to 0.5 mg/kg/day for BMI > 30  Eudelia Bunch, Pharm.D [phone] 03/29/2018 2:34 PM

## 2018-03-29 NOTE — Progress Notes (Signed)
Discussed with primary hospitalist service.  Echo with preserved ventricular function and again seen is dilated ascending aorta.   Dr. Bonner Puna plans to arrange outpatient follow up, and patient has a primary cardiologist at [facility]. CTA of the aorta should be performed as an outpatient in close follow up to determine exact dimensions of ascending aortic aneurysm for timing and appropriateness for intervention.   Cardiology will sign off, but we are available for consultation as needed.

## 2018-03-29 NOTE — Progress Notes (Addendum)
Occupational Therapy Treatment Patient Details Name: Mark Gallagher MRN: [id number] DOB: [birth year] Today's Date: 03/29/2018    History of present illness 48 year old male with a history of acromegaly, panhypopituitarism,  remote resection of pituitary tumor, spinal fusion, L foot drop, atrial flutter and WPW status post ablation x2, history of nonischemic cardiomyopathy and cervical stenosis. recent admission for R DA THA on 03/16/18 per Dr. Percell Miller-- complicated by hypotension and significant anasarca, now re-admitted 03/26/18 with  hyponatremia, Anasarca,  Remains unclear etiology   OT comments  No commode in room that pt could sit on.  OT called portable for wide BSC  Follow Up Recommendations  Home health OT;Supervision/Assistance - 24 hour    Equipment Recommendations  Other (comment)(TBD)    Recommendations for Other Services      Precautions / Restrictions Precautions Precautions: Fall Precaution Comments: L AFO at baseline, has been walking w/o Restrictions Weight Bearing Restrictions: No Other Position/Activity Restrictions: WBAT       Mobility Bed Mobility               General bed mobility comments: pt in chair  Transfers Overall transfer level: Needs assistance Equipment used: Rolling walker (2 wheeled) Transfers: Sit to/from Stand   Stand pivot transfers: Max assist;From elevated surface            Balance Overall balance assessment: Needs assistance Sitting-balance support: Feet supported Sitting balance-Leahy Scale: Good     Standing balance support: Bilateral upper extremity supported;During functional activity Standing balance-Leahy Scale: Poor Standing balance comment: reliant on BUEs this date                           ADL either performed or assessed with clinical judgement   ADL Overall ADL's : Needs assistance/impaired                             Toileting- Clothing Manipulation and Hygiene: Maximal  assistance;Sit to/from stand;Total assistance Toileting - Clothing Manipulation Details (indicate cue type and reason): from chair. Total A with hygiene after having accident       General ADL Comments: pt had BM accident.  No BSC in room. Pt returned to chair. OT called portable for wife BSC.               Cognition Arousal/Alertness: Awake/alert   Overall Cognitive Status: Within Functional Limits for tasks assessed                                                     Pertinent Vitals/ Pain       Pain Assessment: Faces Pain Score: 5  Faces Pain Scale: Hurts a little bit Pain Location: R thigh, generalized Pain Descriptors / Indicators: Aching;Sore Pain Intervention(s): Repositioned;Monitored during session         Frequency  Min 2X/week        Progress Toward Goals  OT Goals(current goals can now be found in the care plan section)  Progress towards OT goals: Progressing toward goals     Plan Discharge plan remains appropriate    Co-evaluation                 AM-PAC OT "6 Clicks" Daily Activity     Outcome Measure  Help from another person eating meals?: None Help from another person taking care of personal grooming?: A Little Help from another person toileting, which includes using toliet, bedpan, or urinal?: A Little Help from another person bathing (including washing, rinsing, drying)?: A Lot Help from another person to put on and taking off regular upper body clothing?: None Help from another person to put on and taking off regular lower body clothing?: A Lot 6 Click Score: 18    End of Session Equipment Utilized During Treatment: Rolling walker;Gait belt  OT Visit Diagnosis: Other abnormalities of gait and mobility (R26.89);Unsteadiness on feet (R26.81);Pain Pain - Right/Left: Right Pain - part of body: Hip   Activity Tolerance Patient tolerated treatment well   Patient Left in chair;with call bell/phone within  reach;with chair alarm set   Nurse Communication Mobility status        Time: 1456-1531 OT Time Calculation (min): 35 min  Charges: OT General Charges $OT Visit: 1 Visit OT Treatments $Self Care/Home Management : 23-37 mins  Kari Baars, [place] [phone] Office- [place], Mark Gallagher 03/29/2018, 4:21 PM

## 2018-03-29 NOTE — Progress Notes (Signed)
Left renal artery duplex has been completed. Preliminary results can be found in CV Proc through chart review.   03/29/18 9:40 AM Mark Gallagher RVT

## 2018-03-29 NOTE — Progress Notes (Signed)
PROGRESS NOTE  Mark Gallagher  YTK:[id number] DOB: Sep 06, 1970 DOA: 03/26/2018 PCP: Wendie Agreste, MD  Outpatient Specialists: Dr. Pleas Patricia, cardiology; Dr. Jerl Mina and Manley Mason, [place] endocrinology; Dr. Derrill Memo, neurosurgery/oncology; Dr. Agapito Games, orthopedics Brief Narrative: Mark Gallagher is a 48 y.o. 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 He had been admitted 2/25 for routine right THA by Dr. Percell Miller. Despite empiric stress steroids, he was hypotensive postoperatively, EBL mentioned to be 1.1L, started on neo, given 2u PRBCs and albumin, and transferred to the ICU. Stress steroids were continued and ultimately he was weaned from pressors, required another unit of PRBCs, but ultimately anemia stabilized. Hydrocortisone was weaned from 2/29 through 3/2. He had leg swelling, LUE swelling which was worked up with negative doppler U/S, but by the day of discharge he was up about 30lbs (276lbs on 3/3 and documented 242lbs at office visit on 2/18). He was discharged 3/3, holding losartan and metoprolol pending cardiology follow up, placed on aspirin 81mg  BID for DVT prophylaxis and back on home hydrocortisone 10mg  daily in addition to aleve, hydrocodone, and robaxin with home medications. He was feeling ok at discharge, had had a BM, but had poor appetite, noticed continued swelling. He was going to follow up with cardiology in [place] but felt too nauseated to travel, so followed up with his PCP 3/6 where he reported continued swelling, up to 285lbs, referred to the ED.   In the ED he appeared with anasarca slight jaundice, afebrile, with hyponatremia at 128 down from 138 at recent discharge, creatinine stable, WBC 10.4k, Hgb 10.1g/dl up from  8.1g/dl at recent discharge. Albumin was 3, total bilirubin was 3.4 with normal AST, ALT, and modestly elevated alkaline phosphatase at 153. Urinalysis showed elevated specific gravity with urine sodium < 10, 0-5 WBCs and RBCs on microscopy, there were ketones but no protein or bacteria. CT abd/pelvis demonstrated diffuse anasarca, borderline cardiomegaly with trace pleural effusions, normal hepatobiliary system, flat IVC, moderate-large stool burden, and abnormal dilation of the left renal artery and vein. He was admitted for further work up, restarted on stress dose steroids.  Assessment & Plan: Principal Problem:   Hyponatremia Active Problems:   Acromegaly ([place])   Hypogonadism male   Cervical spondylosis with myelopathy   Hypothyroidism   Chronic adrenal insufficiency (HCC)   Anasarca   Hyperbilirubinemia  Anasarca: Primarily due to prolonged stress steroids and perioperative volume resuscitation during recent admission. Slightly hypoalbuminemic which is likely contributing. Hepatobiliary structure normal on CT, no proteinuria on urinalysis, no evidence of CHF on echo. No DVT on U/S previously. - Continue lasix 40mg  IV BID. 5L UOP over past 24 hours, creatinine stable. Weights are not accurate, need standing weights, d/w RN. Continue strict I/O.  - Cardiology evaluated the patient 3/8. - Deescalate steroids. Down to home dose cortef 10mg  po daily 3/10. Will discuss this further with pt's endocrinologist, Dr. Almyra Free. Was unable to get answer this morning.  Nausea, vomiting: Likely due to gut edema.   - Encourage po, especially protein intake.  - Antiemetics prn.  Constipation: Resolved.  - Now having loose stools. Will stop bowel regimen and attempt to refrain from imodium, etc. as well.  Panhypopituitarism s/p pituitary macroadenoma resection:  - Continue testosterone 100mg  q2weeks at PCP (last 3/6) -  Continue synthroid 225mcg daily for now. TSH is low as would be expected, free T4 also  low, possibly related to sick syndrome. Will discuss with endocrinology.  - Continue octreotide 30mg  q28d - Has remained normotensive prior to stress steroids. Hyponatremia noted without hyperkalemia or metabolic acidosis. I do not believe he currently requires stress steroids, decreasing dose as above.   Hyperbilirubinemia: Cholestatic pattern with normal AST, ALT, though no hepatobiliary ductal dilatation or parenchymal abnormalities on CT. Urine has been darker.  - Fractionated bilirubin with mixed picture, overall improving. Doubt hemolysis based on stable hgb, platelets.  - ?Due to congestive hepatopathy. Will continue monitoring daily.  Left renal vascular malformation/fistula: Abnormal imaging at admission. Discussed with Dr. Anselm Pancoast who reviewed images, recommended CTA which demonstrated enlarged renal vein with contrast present during arterial phase consistent with AVF. No recent instrumentation. Discussed with vascular surgery, Dr. Carlis Abbott, who recommends just monitoring for now in the setting of normal kidney function, no hematuria/proetinuria. Suspicion is that this is a chronic finding.  - Renal artery duplex ordered per discussion with vascular surgery to get an idea of flow. An indication for intervention may be refractory CHF due to the AVM.  Aortic root dilatation: 5.1cm on echo 03/27/2018. On review of echo report from [facility] 11/05/2016, 5.1cm aorta sin. is noted; noted to be 5.5cm 08/07/2015. Severe cardiac enlargement noted as well with EF 50%, normal RV. Also referred to in report of CTA chest 09/12/2005 "prominence of the ascending aorta at the sinuses of Valsalva."  - Will need surveillance/CT angio as outpatient. Discussed with family.  Hyponatremia: Multifactorial, though with hypoosmolar state and low UNa, suspect due primarily to dehydration in setting of N/V/poor po. Possibly related to opioids, pain.  - Intravascularly depleted but peripherally overloaded. - Monitor during  diuresis  Acute blood loss anemia: Stable/improved from prior admission.  - Monitor for bleeding - Trend CBC.  Right hip OA s/p THA 2/25:  - Routine postoperative care. Spoke with Dr. Percell Miller who can evaluate the patient routinely tomorrow. - DVT ppx in place - Will get up with assistance, PT, OT consulted  History of AFlutter, WPW: Currently in sinus rhythm.  - Monitor HR. Do not feel telemetry currently needed.  Cervical spondylosis: s/p fusions x2, no current myelopathy.  - Continue pain medications prn - Kpad prn  DVT prophylaxis: Lovenox Code Status: Full Family Communication: Mother at bedside this AM, also discussed with daughter by phone today. Disposition Plan: Likely DC home once volume status improving and work up completed.  Consultants:   Radiology, Dr. Anselm Pancoast 3/7.  Cardiology, Dr. Debara Pickett.  Vascular surgery, Dr. Carlis Abbott 3/8.  Procedures:   Echocardiogram 03/27/2018:  1. The left ventricle has normal systolic function, with an ejection fraction of 55-60%. The cavity size was normal. Left ventricular diastolic Doppler parameters are indeterminate.  2. Small pericardial effusion.  3. The pericardial effusion is posterior to the left ventricle (small) and surrounding the apex (moderate).  4. The aortic valve is tricuspid. Mild aortic annular calcification noted. No definitive aortic regurgitation.  5. The mitral valve is normal in structure. Mild thickening of the mitral valve leaflet.  6. There is moderate to severe dilatation of the aortic root. Measures 51 mm in the parasternal views. Suggest further imaging if not already assessed (CTA).  Antimicrobials:  None   Subjective: Now having diarrhea, no dyspnea or chest pain. Feels like he's improved a lot, urinating a lot, abdominal swelling slightly down. Nausea is better. Pain is controlled.  Objective: Vitals:   03/28/18 0651 03/28/18 2201 03/29/18 0601 03/29/18 1359  BP:  114/75 118/75 110/79  Pulse:  96 (!)  48 (!) 51  Resp:  18 17 17   Temp:  99.9 F (37.7 C) 98.6 F (37 C) 98.6 F (37 C)  TempSrc:  Oral Oral Oral  SpO2:  97% 96% 94%  Weight: (!) 153.6 kg     Height:        Intake/Output Summary (Last 24 hours) at 03/29/2018 1513 Last data filed at 03/29/2018 1453 Gross per 24 hour  Intake 1420 ml  Output 6800 ml  Net -5380 ml   Filed Weights   03/26/18 1425 03/27/18 0721 03/28/18 0651  Weight: 129.7 kg (!) 149.6 kg (!) 153.6 kg   Gen: Pleasant acromegalic male in no distress Pulm: Nonlabored breathing room air. Clear. CV: Regular rate and rhythm. No murmur, rub, or gallop. No JVD, 2+ nonpitting edema in legs, abdominal wall. GI: Abdomen firm, no focal tenderness, with hyperactive bowel sounds.  Ext: Warm, no deformities Skin: No rashes, lesions or ulcers on visualized skin. Neuro: Alert and oriented. No focal neurological deficits. Psych: Judgement and insight appear fair. Mood euthymic & affect congruent. Behavior is appropriate.    Data Reviewed: I have personally reviewed following labs and imaging studies  CBC: Recent Labs  Lab 03/26/18 1212 03/26/18 1514 03/27/18 0218 03/28/18 0407 03/29/18 0344  WBC 11.7* 10.4 9.3 8.0 6.0  NEUTROABS  --  8.2*  --   --   --   HGB 10.2* 10.1* 9.8* 8.8* 8.8*  HCT 30.4 32.9* 31.2* 27.7* 28.7*  MCV 95.5 101.5* 100.0 100.0 101.4*  PLT  --  300 243 277 657   Basic Metabolic Panel: Recent Labs  Lab 03/26/18 1514 03/27/18 0218 03/28/18 0407 03/29/18 0344  NA 128* 129* 131* 134*  K 3.6 3.6 3.0* 3.2*  CL 93* 95* 97* 95*  CO2 25 22 25 30   GLUCOSE 113* 110* 128* 99  BUN 22* 18 13 11   CREATININE 0.75 0.64  0.65 0.71 0.62  CALCIUM 8.2* 8.0* 7.9* 7.9*   GFR: Estimated Creatinine Clearance: 185.8 mL/min (by C-G formula based on SCr of 0.62 mg/dL). Liver Function Tests: Recent Labs  Lab 03/26/18 1514 03/27/18 0218 03/27/18 1428 03/28/18 0407 03/29/18 0344  AST 22 32  --  21 18  ALT 24 38  --  35 31  ALKPHOS 153* 162*  --   130* 125  BILITOT 3.4* 4.5*  4.5* 3.7* 2.1* 2.3*  PROT 5.6* 5.6*  --  5.0* 5.3*  ALBUMIN 3.0* 3.2*  --  2.8* 3.0*   Recent Labs  Lab 03/26/18 1514  LIPASE 24   No results for input(s): AMMONIA in the last 168 hours. Coagulation Profile: Recent Labs  Lab 03/27/18 0218  INR 1.2   Cardiac Enzymes: No results for input(s): CKTOTAL, CKMB, CKMBINDEX, TROPONINI in the last 168 hours. BNP (last 3 results) No results for input(s): PROBNP in the last 8760 hours. HbA1C: No results for input(s): HGBA1C in the last 72 hours. CBG: Recent Labs  Lab 03/22/18 2111 03/23/18 0036 03/23/18 0434 03/23/18 0744 03/23/18 1234  GLUCAP 134* 104* 100* 97 103*   Lipid Profile: No results for input(s): CHOL, HDL, LDLCALC, TRIG, CHOLHDL, LDLDIRECT in the last 72 hours. Thyroid Function Tests: Recent Labs    03/27/18 0218 03/27/18 1428  TSH 0.019*  --   FREET4  --  0.71*   Anemia Panel: No results for input(s): VITAMINB12, FOLATE, FERRITIN, TIBC,  IRON, RETICCTPCT in the last 72 hours. Urine analysis:    Component Value Date/Time   COLORURINE AMBER (A) 03/26/2018 2343   APPEARANCEUR CLEAR 03/26/2018 2343   LABSPEC 1.041 (H) 03/26/2018 2343   PHURINE 5.0 03/26/2018 2343   GLUCOSEU NEGATIVE 03/26/2018 2343   HGBUR SMALL (A) 03/26/2018 2343   BILIRUBINUR SMALL (A) 03/26/2018 2343   KETONESUR 20 (A) 03/26/2018 2343   PROTEINUR NEGATIVE 03/26/2018 2343   NITRITE NEGATIVE 03/26/2018 2343   LEUKOCYTESUR NEGATIVE 03/26/2018 2343   No results found for this or any previous visit (from the past 240 hour(s)).    Radiology Studies: Ct Angio Abdomen W &/or Wo Contrast  Result Date: 03/28/2018 CLINICAL DATA:  Left renal vascular malformation by CT 03/26/2018 EXAM: CT ANGIOGRAPHY ABDOMEN TECHNIQUE: Multidetector CT imaging of the abdomen was performed using the standard protocol during bolus administration of intravenous contrast. Multiplanar reconstructed images and MIPs were obtained and reviewed  to evaluate the vascular anatomy. CONTRAST:  160mL OMNIPAQUE IOHEXOL 350 MG/ML SOLN COMPARISON:  03/26/2018 FINDINGS: VASCULAR Aorta: Intact aorta and normal caliber. Negative for aneurysm, dissection, occlusive process or retroperitoneal hemorrhage. Celiac: Remains patent with slight proximal tortuosity. Splenic, left gastric, and hepatic vasculature all patent. SMA: Widely patent including its branches Renals: Normal appearing right renal artery. No accessory renal vasculature. Left renal artery is widely patent and enlarged. Within the left hilum, there is an abnormal tortuous vascular structure with some scattered wall calcifications measuring 3.1 x 4.9 cm. There is and associated very large dilated draining renal vein. Renal vein is also circumaortic in configuration. Appearance is compatible with a chronic appearing left renal vascular malformation, most consistent with a renal AVF. No evidence of acute bleeding or embolic process. No renal infarcts. IMA: Widely patent off the distal aorta including its branches Inflow: Not assessed Veins: No veno-occlusive process Review of the MIP images confirms the above findings. NON-VASCULAR Lower chest: Bibasilar atelectasis and small pleural effusions. Hepatobiliary: No focal liver abnormality is seen. No gallstones, gallbladder wall thickening, or biliary dilatation. Pancreas: Unremarkable. No pancreatic ductal dilatation or surrounding inflammatory changes. Spleen: Normal in size without focal abnormality. Adrenals/Urinary Tract: Normal adrenal glands. No renal obstruction, hydronephrosis, acute obstructive uropathy. No hydroureter. Left renal hilum vascular malformation as described above. Stomach/Bowel: Similar moderate colonic distension and large volume of retained stool in the right colon. Small bowel nondilated. No definite obstruction pattern. No free air, fluid collection, abscess, or ascites. Lymphatic: No bulky adenopathy. Other: Intact abdominal wall. No  hernia. Body subcutaneous edema evident compatible with anasarca. Musculoskeletal: Degenerative changes of the spine. 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 No evidence of acute arterial bleeding or renal embolic process. NON-VASCULAR No other acute intra-abdominal vascular finding. Nonspecific colonic distension Bibasilar atelectasis and trace pleural effusions Body anasarca Electronically Signed   By: Jerilynn Mages.  Shick M.D.   On: 03/28/2018 09:05   Vas US Renal Artery Duplex  Result Date: 03/29/2018 ABDOMINAL VISCERAL Limitations: Air/bowel gas and patient discomfort. Performing Technologist: Oliver Hum RVT  Examination Guidelines: A complete evaluation includes B-mode imaging, spectral Doppler, color Doppler, and power Doppler as needed of all accessible portions of each vessel. Bilateral testing is considered an integral part of a complete examination. Limited examinations for reoccurring indications may be performed as noted.  Duplex Findings: +----------+--------+--------+------+--------+ MesentericPSV cm/sEDV cm/sPlaqueComments +----------+--------+--------+------+--------+ Aorta Mid   115  35                  +----------+--------+--------+------+--------+  +-----------------+--------+--------+-------+ Left Renal ArteryPSV cm/sEDV cm/sComment +-----------------+--------+--------+-------+ Origin              55      23           +-----------------+--------+--------+-------+ Proximal            81      35           +-----------------+--------+--------+-------+ Mid                 45      28           +-----------------+--------+--------+-------+ Distal              68      36           +-----------------+--------+--------+-------+ Technologist observations Renal Artery(s):Prox, mid, and distal renal vein waveforms  appear pulsatile. +------------+--------+--------+--+-----------+--------+--------+----+ Right KidneyPSV cm/sEDV cm/sRILeft KidneyPSV cm/sEDV cm/sRI   +------------+--------+--------+--+-----------+--------+--------+----+ Upper Pole                    Upper Pole 123     61      0.51 +------------+--------+--------+--+-----------+--------+--------+----+ Mid                           Mid        146     88      0.40 +------------+--------+--------+--+-----------+--------+--------+----+ Lower Pole                    Lower Pole 39      8       0.78 +------------+--------+--------+--+-----------+--------+--------+----+ Hilar                         Hilar      139     80      0.42 +------------+--------+--------+--+-----------+--------+--------+----+ +------------------++------------------+-----+ Right Kidney      Left Kidney             +------------------++------------------+-----+ RAR               RAR                     +------------------++------------------+-----+ RAR (manual)      RAR (manual)      0.7   +------------------++------------------+-----+ Cortex            Cortex                  +------------------++------------------+-----+ Cortex thickness  Corex thickness         +------------------++------------------+-----+ Kidney length (cm)Kidney length (cm)11.50 +------------------++------------------+-----+   Summary: Renal:  Left: Normal size of left kidney. Normal left Resistive Index. 1-59%       stenosis of the left renal artery.  *See table(s) above for measurements and observations.     Preliminary     Scheduled Meds: . docusate sodium  250 mg Oral Daily  . [START ON 03/30/2018] enoxaparin (LOVENOX) injection  80 mg Subcutaneous Daily  . ferrous sulfate  325 mg Oral BID WC  . furosemide  40 mg Intravenous BID  . gabapentin  300 mg Oral BID  . hydrocortisone sod succinate (SOLU-CORTEF) inj  50 mg Intravenous Daily  .  levothyroxine  200 mcg Oral Daily  . lip balm      .  polyethylene glycol  17 g Oral BID  . potassium chloride  40 mEq Oral BID  . senna  2 tablet Oral Daily  . sodium chloride flush  3 mL Intravenous Q12H   Continuous Infusions: . sodium chloride    . methocarbamol (ROBAXIN) IV       LOS: 3 days   Time spent: 35 minutes.  Patrecia Pour, MD [facility] [email or web address] Password [email or web address] 03/29/2018, 3:13 PM

## 2018-03-30 DIAGNOSIS — I770 Arteriovenous fistula, acquired: Secondary | ICD-10-CM

## 2018-03-30 LAB — COMPREHENSIVE METABOLIC PANEL
ALT: 36 U/L (ref 0–44)
AST: 24 U/L (ref 15–41)
Albumin: 3.3 g/dL — ABNORMAL LOW (ref 3.5–5.0)
Alkaline Phosphatase: 122 U/L (ref 38–126)
Anion gap: 12 (ref 5–15)
BUN: 10 mg/dL (ref 6–20)
CO2: 29 mmol/L (ref 22–32)
Calcium: 8.2 mg/dL — ABNORMAL LOW (ref 8.9–10.3)
Chloride: 93 mmol/L — ABNORMAL LOW (ref 98–111)
Creatinine, Ser: 0.59 mg/dL — ABNORMAL LOW (ref 0.61–1.24)
GFR calc Af Amer: >60 mL/min (ref >60)
GFR calc non Af Amer: >60 mL/min (ref >60)
Glucose, Bld: 97 mg/dL (ref 70–99)
Potassium: 3.3 mmol/L — ABNORMAL LOW (ref 3.5–5.1)
Sodium: 134 mmol/L — ABNORMAL LOW (ref 135–145)
Total Bilirubin: 2.4 mg/dL — ABNORMAL HIGH (ref 0.3–1.2)
Total Protein: 5.7 g/dL — ABNORMAL LOW (ref 6.5–8.1)

## 2018-03-30 LAB — CBC
HCT: 28.6 % — ABNORMAL LOW (ref 39.0–52.0)
Hemoglobin: 9.1 g/dL — ABNORMAL LOW (ref 13.0–17.0)
MCH: 31.7 pg (ref 26.0–34.0)
MCHC: 31.8 g/dL (ref 30.0–36.0)
MCV: 99.7 fL (ref 80.0–100.0)
Platelets: 294 10*3/uL (ref 150–400)
RBC: 2.87 MIL/uL — ABNORMAL LOW (ref 4.22–5.81)
RDW: 16.1 % — ABNORMAL HIGH (ref 11.5–15.5)
WBC: 6 10*3/uL (ref 4.0–10.5)
nRBC: 0 % (ref 0.0–0.2)

## 2018-03-30 LAB — T3, FREE: T3, Free: 0.9 pg/mL — ABNORMAL LOW (ref 2.0–4.4)

## 2018-03-30 MED ORDER — POTASSIUM CHLORIDE CRYS ER 20 MEQ PO TBCR
40.0000 meq | EXTENDED_RELEASE_TABLET | Freq: Three times a day (TID) | ORAL | Status: DC
  Administered 2018-03-30 – 2018-04-01 (×7): 40 meq via ORAL
  Filled 2018-03-30 (×7): qty 2

## 2018-03-30 MED ORDER — MAGNESIUM SULFATE 2 GM/50ML IV SOLN
2.0000 g | Freq: Once | INTRAVENOUS | Status: AC
  Administered 2018-03-30: 2 g via INTRAVENOUS
  Filled 2018-03-30: qty 50

## 2018-03-30 MED ORDER — PSYLLIUM 95 % PO PACK
1.0000 | PACK | Freq: Every day | ORAL | Status: DC
  Administered 2018-03-30 – 2018-03-31 (×2): 1 via ORAL
  Filled 2018-03-30 (×2): qty 1

## 2018-03-30 NOTE — Progress Notes (Signed)
Physical Therapy Treatment Patient Details Name: Mark Gallagher MRN: [id number] DOB: [birth year] Today's Date: 03/30/2018    History of Present Illness 48 year old male with a history of acromegaly, panhypopituitarism,  remote resection of pituitary tumor, spinal fusion, L foot drop, atrial flutter and WPW status post ablation x2, history of nonischemic cardiomyopathy and cervical stenosis. recent admission for R DA THA on 03/16/18 per Dr. Percell Miller-- complicated by hypotension and significant anasarca, now re-admitted 03/26/18 with  hyponatremia, Anasarca,  Remains unclear etiology    PT Comments    Pt able to ambulate in the hallway with recliner follow.  He c/o low back pain from being in the bed.   Follow Up Recommendations  Follow surgeon's recommendation for DC plan and follow-up therapies;Home health PT;Supervision for mobility/OOB     Equipment Recommendations  None recommended by PT    Recommendations for Other Services       Precautions / Restrictions Precautions Precautions: Fall Precaution Comments: L AFO at baseline, has been walking w/o Restrictions Weight Bearing Restrictions: No    Mobility  Bed Mobility Overal bed mobility: Needs Assistance Bed Mobility: Supine to Sit     Supine to sit: Min assist;Mod assist     General bed mobility comments: A for trunk and used pads to get hips turned  Transfers Overall transfer level: Needs assistance Equipment used: Rolling walker (2 wheeled)   Sit to Stand: From elevated surface;Min assist;+2 physical assistance;+2 safety/equipment         General transfer comment: cues for hand placement. He flexed forward first then extended trunk  Ambulation/Gait Ambulation/Gait assistance: Min guard;+2 safety/equipment Gait Distance (Feet): 150 Feet Assistive device: Rolling walker (2 wheeled) Gait Pattern/deviations: Step-through pattern;Trunk flexed     General Gait Details: As he fatigued he flexed more, but would  correct with cueing.  He took 1 standing rest break and had recliner follow.   Stairs             Wheelchair Mobility    Modified Rankin (Stroke Patients Only)       Balance Overall balance assessment: Needs assistance Sitting-balance support: Feet supported Sitting balance-Leahy Scale: Good     Standing balance support: Bilateral upper extremity supported;During functional activity Standing balance-Leahy Scale: Poor Standing balance comment: requires UE support                            Cognition Arousal/Alertness: Awake/alert Behavior During Therapy: WFL for tasks assessed/performed Overall Cognitive Status: Within Functional Limits for tasks assessed                                        Exercises      General Comments        Pertinent Vitals/Pain Pain Assessment: Faces Faces Pain Scale: Hurts even more Pain Location: back Pain Descriptors / Indicators: Aching;Sore Pain Intervention(s): Limited activity within patient's tolerance;Monitored during session;Repositioned;Heat applied    Home Living                      Prior Function            PT Goals (current goals can now be found in the care plan section) Acute Rehab PT Goals PT Goal Formulation: With patient Time For Goal Achievement: 04/13/18 Potential to Achieve Goals: Good Progress towards PT goals: Progressing toward goals  Frequency    Min 3X/week      PT Plan Current plan remains appropriate    Co-evaluation              AM-PAC PT "6 Clicks" Mobility   Outcome Measure  Help needed turning from your back to your side while in a flat bed without using bedrails?: A Little Help needed moving from lying on your back to sitting on the side of a flat bed without using bedrails?: A Little Help needed moving to and from a bed to a chair (including a wheelchair)?: A Little Help needed standing up from a chair using your arms (e.g.,  wheelchair or bedside chair)?: A Little Help needed to walk in hospital room?: A Little Help needed climbing 3-5 steps with a railing? : A Lot 6 Click Score: 17    End of Session Equipment Utilized During Treatment: Gait belt Activity Tolerance: Patient tolerated treatment well Patient left: in chair;with call bell/phone within reach;with family/visitor present(pillows and blankets in chair to make surface higher) Nurse Communication: Mobility status PT Visit Diagnosis: Muscle weakness (generalized) (M62.81);Difficulty in walking, not elsewhere classified (R26.2) Pain - Right/Left: Left Pain - part of body: Hip     Time: 1103(no time charged for time Nurse came in to give meds)-1144 PT Time Calculation (min) (ACUTE ONLY): 41 min  Charges:  $Gait Training: 8-22 mins $Therapeutic Activity: 8-22 mins                     Mark Gallagher L. Tamala Julian, [place] Pager [phone] 03/30/2018    Mark Gallagher 03/30/2018, 12:22 PM

## 2018-03-30 NOTE — Progress Notes (Addendum)
PROGRESS NOTE  [name]  ZDG:[id number] DOB: [birth year] DOA: 03/26/2018 PCP: Wendie Agreste, MD  Outpatient Specialists: Dr. Pleas Patricia, cardiology; Dr. Jerl Mina and Manley Mason, [place] endocrinology; Dr. Derrill Memo, neurosurgery/oncology; Dr. Agapito Games, orthopedics Brief Narrative: Mark Gallagher is a 48 y.o. 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 He had been admitted 2/25 for routine right THA by Dr. Percell Miller. Despite empiric stress steroids, he was hypotensive postoperatively, EBL mentioned to be 1.1L, started on neo, given 2u PRBCs and albumin, and transferred to the ICU. Stress steroids were continued and ultimately he was weaned from pressors, required another unit of PRBCs, but ultimately anemia stabilized. Hydrocortisone was weaned from 2/29 through 3/2. He had leg swelling, LUE swelling which was worked up with negative doppler U/S, but by the day of discharge he was up about 30lbs (276lbs on 3/3 and documented 242lbs at office visit on 2/18). He was discharged 3/3, holding losartan and metoprolol pending cardiology follow up, placed on aspirin 81mg  BID for DVT prophylaxis and back on home hydrocortisone 10mg  daily in addition to aleve, hydrocodone, and robaxin with home medications. He was feeling ok at discharge, had had a BM, but had poor appetite, noticed continued swelling. He was going to follow up with cardiology in [place] but felt too nauseated to travel, so followed up with his PCP 3/6 where he reported continued swelling, up to 285lbs, referred to the ED.   In the ED he appeared with anasarca slight jaundice, afebrile, with hyponatremia at 128 down from 138 at recent discharge, creatinine stable, WBC 10.4k, Hgb 10.1g/dl up from  8.1g/dl at recent discharge. Albumin was 3, total bilirubin was 3.4 with normal AST, ALT, and modestly elevated alkaline phosphatase at 153. Urinalysis showed elevated specific gravity with urine sodium < 10, 0-5 WBCs and RBCs on microscopy, there were ketones but no protein or bacteria. CT abd/pelvis demonstrated diffuse anasarca, borderline cardiomegaly with trace pleural effusions, normal hepatobiliary system, flat IVC, moderate-large stool burden, and abnormal dilation of the left renal artery and vein. He was admitted for further work up, restarted on stress dose steroids.  Assessment & Plan: Principal Problem:   Hyponatremia Active Problems:   Acromegaly ([place])   Hypogonadism male   Cervical spondylosis with myelopathy   Hypothyroidism   Chronic adrenal insufficiency (H[facility])   Anasarca   Hyperbilirubinemia  Anasarca: Primarily due to prolonged stress steroids and perioperative volume resuscitation during recent admission. Slightly hypoalbuminemic which is likely contributing. Hepatobiliary structure normal on CT, no proteinuria on urinalysis, no evidence of CHF on echo. No DVT on U/S previously. - Will continue lasix 40mg  IV BID. 5L UOP again over past 24 hours, creatinine stable. Weights may be more accurate now, still >20lbs above EDW. Cardiology evaluated the patient 3/8, not now following. - Strict I/O. Will have unmeasured UOP as condom cath came loose earlier. - Deescalated steroids. Down to home dose cortef 10mg  po daily 3/10. Called patient's endocrinologist, Dr. Almyra Free, for further recommendations. Left voicemail on nurse triage line with my cell phone call back number and alternatively the number for the 5th floor west was also left.   Left renal vascular malformation/fistula: Abnormal imaging at admission. Discussed with Dr. Anselm Pancoast who reviewed images, recommended CTA which demonstrated enlarged renal vein with contrast present  during arterial phase consistent with AVF. No recent  instrumentation. Discussed with vascular surgery, Dr. Carlis Abbott, who recommends just monitoring for now in the setting of normal kidney function, no hematuria/proetinuria and obtaining duplex scan.  - Discussed with vascular surgery, Dr. Scot Dock. He has agreed to evaluate the patient to make further recommendations. I appreciate his assistance in the care of this patient.   Nausea, vomiting: Likely due to gut edema.   - Encourage po, especially protein intake.  - Antiemetics prn.  Constipation: Resolved.  - Now having loose stools. Stopped laxatives, prefer to avoid imodium to minimize risk of over correction.  Panhypopituitarism s/p pituitary macroadenoma resection:  - Continue testosterone 100mg  q2weeks at PCP (last 3/6) - Continue synthroid 217mcg daily for now. TSH is low as would be expected, free T4 and free T3 also low, possibly related to sick thyroid syndrome. He does not have clinical evidence of overt hypo/hyperthyroidism. Plan to discuss further with endocrinology, as above.  - Continue octreotide 30mg  q28d - Has remained normotensive prior to stress steroids. Hyponatremia noted without hyperkalemia or metabolic acidosis. I do not believe he currently requires stress steroids, decreasing dose as above.   Hyperbilirubinemia: Cholestatic pattern with normal AST, ALT, though no hepatobiliary ductal dilatation or parenchymal abnormalities on CT. Urine has been darker.  - Fractionated bilirubin with mixed picture, overall improving. Doubt hemolysis based on stable hgb, platelets.  - ?Due to congestive hepatopathy. Will continue monitoring daily.  Aortic root dilatation: 5.1cm on echo 03/27/2018. On review of echo report from [place] 11/05/2016, 5.1cm aorta sin. is noted; noted to be 5.5cm 08/07/2015. Severe cardiac enlargement noted as well with EF 50%, normal RV. Also referred to in report of CTA chest 09/12/2005 "prominence of the ascending aorta at the sinuses of Valsalva."  - Will need  surveillance/CT angio as outpatient. Discussed with family.  Hyponatremia: Multifactorial, though with hypoosmolar state and low UNa, suspect due primarily to intravascular depletion in setting of N/V/poor po. Possibly related to opioids, pain.  - Improving with diuresis, improving po intake.  Hypokalemia:  - Replace as needed with diuresis. Will increase to 33mEq po TID and recheck daily.   Acute blood loss anemia: Stable/improved from prior admission.  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 Dr. Harvie Heck saw patient 3/10, made routin follow up recommendations.   - DVT ppx in place - Will get up with assistance, PT, OT consulted  History of AFlutter, WPW: Currently in sinus rhythm.  - Monitor HR. Do not feel telemetry currently needed.  Cervical spondylosis: s/p fusions x2, no current myelopathy.  - Continue pain medications prn - Kpad prn  DVT prophylaxis: Lovenox Code Status: Full Family Communication: Parents and sister at bedside this AM Disposition Plan: Likely DC home once volume status improving and work up completed.  Consultants:   Radiology, Dr. Anselm Pancoast 3/7.  Cardiology, Dr. Debara Pickett.  Vascular surgery, Dr. Carlis Abbott 3/8. Spoke with Dr. Scot Dock 3/10.  Procedures:   Echocardiogram 03/27/2018:  1. The left ventricle has normal systolic function, with an ejection fraction of 55-60%. The cavity size was normal. Left ventricular diastolic Doppler parameters are indeterminate.  2. Small pericardial effusion.  3. The pericardial effusion is posterior to the left ventricle (small) and surrounding the apex (moderate).  4. The aortic valve is tricuspid. Mild aortic annular calcification noted. No definitive aortic regurgitation.  5. The mitral valve is normal in structure. Mild thickening of the mitral valve leaflet.  6.  There is moderate to severe dilatation of the aortic root. Measures 51 mm in the parasternal views. Suggest further  imaging if not already assessed (CTA).  Antimicrobials:  None   Subjective: Diarrhea from yesterday stable, none overnight. Abdominal swelling is improving, decreasing. No dyspnea or chest pain. Feels increasing lower back pain (chronic problem) due to laying in bed so much. Wants to get up today. Wants to try a different mattress. Kept down some breakfast.  Objective: Vitals:   03/29/18 0601 03/29/18 1359 03/29/18 2214 03/30/18 0548  BP: 118/75 110/79 132/85 109/82  Pulse: (!) 48 (!) 51 (!) 105 87  Resp: 17 17 17 17   Temp: 98.6 F (37 C) 98.6 F (37 C) (!) 97.5 F (36.4 C) 98.6 F (37 C)  TempSrc: Oral Oral Oral Oral  SpO2: 96% 94% 96% 96%  Weight:   118.9 kg 118.9 kg  Height:        Intake/Output Summary (Last 24 hours) at 03/30/2018 1349 Last data filed at 03/30/2018 1056 Gross per 24 hour  Intake 550 ml  Output 3450 ml  Net -2900 ml   Filed Weights   03/28/18 0651 03/29/18 2214 03/30/18 0548  Weight: (!) 153.6 kg 118.9 kg 118.9 kg   Gen: Pleasant acromegalic male in no distress Pulm: Nonlabored breathing room air. Clear. CV: Regular rate and rhythm. No murmur, rub, or gallop. No JVD, 1+ not significantly pitting dependent edema in legs and abdomen. GI: Abdomen less firm, generally uncomfortable without focal tenderness. +BS. Ext: Warm, no deformities Skin: No rashes, lesions or ulcers on visualized skin. No longer appears jaundiced. Neuro: Alert and oriented. No focal neurological deficits. Psych: Judgement and insight appear fair. Mood euthymic & affect congruent. Behavior is appropriate.    Data Reviewed: I have personally reviewed following labs and imaging studies  CBC: Recent Labs  Lab 03/26/18 1514 03/27/18 0218 03/28/18 0407 03/29/18 0344 03/30/18 0422  WBC 10.4 9.3 8.0 6.0 6.0  NEUTROABS 8.2*  --   --   --   --   HGB 10.1* 9.8* 8.8* 8.8* 9.1*  HCT 32.9* 31.2* 27.7* 28.7* 28.6*  MCV 101.5* 100.0 100.0 101.4* 99.7  PLT 300 243 277 273 220   Basic  Metabolic Panel: Recent Labs  Lab 03/26/18 1514 03/27/18 0218 03/28/18 0407 03/29/18 0344 03/30/18 0422  NA 128* 129* 131* 134* 134*  K 3.6 3.6 3.0* 3.2* 3.3*  CL 93* 95* 97* 95* 93*  CO2 25 22 25 30 29   GLUCOSE 113* 110* 128* 99 97  BUN 22* 18 13 11 10   CREATININE 0.75 0.64  0.65 0.71 0.62 0.59*  CALCIUM 8.2* 8.0* 7.9* 7.9* 8.2*   GFR: Estimated Creatinine Clearance: 163.6 mL/min (A) (by C-G formula based on SCr of 0.59 mg/dL (L)). Liver Function Tests: Recent Labs  Lab 03/26/18 1514 03/27/18 0218 03/27/18 1428 03/28/18 0407 03/29/18 0344 03/30/18 0422  AST 22 32  --  21 18 24   ALT 24 38  --  35 31 36  ALKPHOS 153* 162*  --  130* 125 122  BILITOT 3.4* 4.5*  4.5* 3.7* 2.1* 2.3* 2.4*  PROT 5.6* 5.6*  --  5.0* 5.3* 5.7*  ALBUMIN 3.0* 3.2*  --  2.8* 3.0* 3.3*   Recent Labs  Lab 03/26/18 1514  LIPASE 24   No results for input(s): AMMONIA in the last 168 hours. Coagulation Profile: Recent Labs  Lab 03/27/18 0218  INR 1.2   Cardiac Enzymes: No results for input(s): CKTOTAL, CKMB, CKMBINDEX, TROPONINI in  the last 168 hours. BNP (last 3 results) No results for input(s): PROBNP in the last 8760 hours. HbA1C: No results for input(s): HGBA1C in the last 72 hours. CBG: No results for input(s): GLUCAP in the last 168 hours. Lipid Profile: No results for input(s): CHOL, HDL, LDLCALC, TRIG, CHOLHDL, LDLDIRECT in the last 72 hours. Thyroid Function Tests: Recent Labs    03/27/18 1428 03/29/18 0344  FREET4 0.71*  --   T3FREE  --  0.9*   Anemia Panel: No results for input(s): VITAMINB12, FOLATE, FERRITIN, TIBC, IRON, RETICCTPCT in the last 72 hours. Urine analysis:    Component Value Date/Time   COLORURINE AMBER (A) 03/26/2018 2343   APPEARANCEUR CLEAR 03/26/2018 2343   LABSPEC 1.041 (H) 03/26/2018 2343   PHURINE 5.0 03/26/2018 2343   GLUCOSEU NEGATIVE 03/26/2018 2343   HGBUR SMALL (A) 03/26/2018 2343   BILIRUBINUR SMALL (A) 03/26/2018 2343   KETONESUR 20  (A) 03/26/2018 2343   PROTEINUR NEGATIVE 03/26/2018 2343   NITRITE NEGATIVE 03/26/2018 2343   LEUKOCYTESUR NEGATIVE 03/26/2018 2343   No results found for this or any previous visit (from the past 240 hour(s)).    Radiology Studies: Vas US Renal Artery Duplex  Result Date: 03/29/2018 ABDOMINAL VISCERAL Limitations: Air/bowel gas and patient discomfort. Performing Technologist: Oliver Hum RVT  Examination Guidelines: A complete evaluation includes B-mode imaging, spectral Doppler, color Doppler, and power Doppler as needed of all accessible portions of each vessel. Bilateral testing is considered an integral part of a complete examination. Limited examinations for reoccurring indications may be performed as noted.  Duplex Findings: +----------+--------+--------+------+--------+ MesentericPSV cm/sEDV cm/sPlaqueComments +----------+--------+--------+------+--------+ Aorta Mid   115      35                  +----------+--------+--------+------+--------+  +-----------------+--------+--------+-------+ Left Renal ArteryPSV cm/sEDV cm/sComment +-----------------+--------+--------+-------+ Origin              55      23           +-----------------+--------+--------+-------+ Proximal            81      35           +-----------------+--------+--------+-------+ Mid                 45      28           +-----------------+--------+--------+-------+ Distal              68      36           +-----------------+--------+--------+-------+ Technologist observations Renal Artery(s):Prox, mid, and distal renal vein waveforms appear pulsatile. +------------+--------+--------+--+-----------+--------+--------+----+ Right KidneyPSV cm/sEDV cm/sRILeft KidneyPSV cm/sEDV cm/sRI   +------------+--------+--------+--+-----------+--------+--------+----+ Upper Pole                    Upper Pole 123     61      0.51  +------------+--------+--------+--+-----------+--------+--------+----+ Mid                           Mid        146     88      0.40 +------------+--------+--------+--+-----------+--------+--------+----+ Lower Pole                    Lower Pole 39      8       0.78 +------------+--------+--------+--+-----------+--------+--------+----+ Hilar  Hilar      139     80      0.42 +------------+--------+--------+--+-----------+--------+--------+----+ +------------------++------------------+-----+ Right Kidney      Left Kidney             +------------------++------------------+-----+ RAR               RAR                     +------------------++------------------+-----+ RAR (manual)      RAR (manual)      0.7   +------------------++------------------+-----+ Cortex            Cortex                  +------------------++------------------+-----+ Cortex thickness  Corex thickness         +------------------++------------------+-----+ Kidney length (cm)Kidney length (cm)11.50 +------------------++------------------+-----+  Summary: Renal:  Left: Normal size of left kidney. Normal left Resistive Index. 1-59%       stenosis of the left renal artery.  *See table(s) above for measurements and observations.  Diagnosing physician: Ruta Hinds MD  Electronically signed by Ruta Hinds MD on 03/29/2018 at 5:06:39 PM.    Final     Scheduled Meds: . enoxaparin (LOVENOX) injection  80 mg Subcutaneous Daily  . ferrous sulfate  325 mg Oral BID WC  . furosemide  40 mg Intravenous BID  . gabapentin  300 mg Oral BID  . hydrocortisone  10 mg Oral Daily  . levothyroxine  200 mcg Oral Daily  . potassium chloride  40 mEq Oral TID  . psyllium  1 packet Oral Daily  . sodium chloride flush  3 mL Intravenous Q12H   Continuous Infusions: . sodium chloride    . methocarbamol (ROBAXIN) IV       LOS: 4 days   Time spent: 35 minutes.  Patrecia Pour,  MD [facility] [email or web address] Password [email or web address] 03/30/2018, 1:49 PM

## 2018-03-30 NOTE — Consult Note (Signed)
REASON FOR CONSULT:    AV malformation left kidney.  The consult was requested byDr. 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  This is asymptomatic and based on its appearance appears to be chronic.  From a vascular standpoint, if this enlarged or became symptomatic this is not something that we would be able to repair or address.  I think his only option if this enlarge significantly or became symptomatic would be a nephrectomy.  Based on its location looks like it would be very difficult for IR to embolize this without compromising the entire kidney.  Given that this is asymptomatic therefore I would not recommend an aggressive approach to this.  I think this should be followed by urology.  I would recommend urologic consultation or an outpatient visit so that he can be plugged in for long-term follow-up of this unusual problem.  Vascular surgery will be available as needed.   Deitra Mayo, MD, FACS Beeper [phone] Office: [phone]   HPI:   Mark Gallagher is a pleasant 48 y.o. male, who underwent a right hip replacement on 03/16/2018.  Patient had a complicated postoperative course secondary to hypotension.  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  He also had radiation therapy.  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  He is also had cervical spine stenosis with fusions in the past.  He ultimately was discharged home 8 days after his hip replacement but returned and was readmitted with bilateral lower extremity swelling.  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  CT angiogram confirmed what appears to be an AV malformation in the hilum of the left kidney.  For this reason vascular surgery was consulted.  On my history  he denies any history of abdominal pain.  He has not had any back pain until recently he has had some pain he thinks attributable to his hospital bed.  He denies any hematuria.  He said no specific injury to his kidneys that he is aware of.  He really does not have any risk factors for peripheral vascular disease.  He denies any history of diabetes, hypertension, hypercholesterolemia, family history of premature cardiovascular disease or history of tobacco use.  Past Medical History:  Diagnosis Date  . 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 Arthritis   . Cancer ([place])   . History of pituitary tumor   . Hypertension   . Hypothyroidism   . Knee pain, bilateral   . Pneumonia   . Testosterone deficiency   . Thyroid disease     Family History  Problem Relation Age of Onset  . High blood pressure Mother   . Acute myelogenous leukemia Brother     SOCIAL HISTORY: Social History   Socioeconomic History  . Marital status: Single    Spouse name: Not on file  . Number of children: Not on file  . Years of education: Not on file  . Highest education level: Not on file  Occupational History  . Not on file  Social Needs  . Financial resource strain: Not on file  . Food insecurity:    Worry: Not on file    Inability: Not on file  . Transportation needs:    Medical: Not  on file    Non-medical: Not on file  Tobacco Use  . Smoking status: Never Smoker  . Smokeless tobacco: Never Used  Substance and Sexual Activity  . Alcohol use: No  . Drug use: No  . Sexual activity: Not on file  Lifestyle  . Physical activity:    Days per week: Not on file    Minutes per session: Not on file  . Stress: Not on file  Relationships  . Social connections:    Talks on phone: Not on file    Gets together: Not on file    Attends religious service: Not on file    Active member of club or organization: Not on file    Attends meetings of clubs or organizations: Not on file    Relationship status: Not on file  .  Intimate partner violence:    Fear of current or ex partner: Not on file    Emotionally abused: Not on file    Physically abused: Not on file    Forced sexual activity: Not on file  Other Topics Concern  . Not on file  Social History Narrative  . Not on file    Allergies  Allergen Reactions  . Oxycodone Nausea Only    Current Facility-Administered Medications  Medication Dose Route Frequency Provider Last Rate Last Dose  . 0.9 %  sodium chloride infusion  250 mL Intravenous PRN Wouk, Ailene Rud, MD      . bisacodyl (DULCOLAX) suppository 10 mg  10 mg Rectal Daily PRN Patrecia Pour, MD      . enoxaparin (LOVENOX) injection 80 mg  80 mg Subcutaneous Daily Eudelia Bunch, RPH      . ferrous sulfate tablet 325 mg  325 mg Oral BID WC Gwynne Edinger, MD   325 mg at 03/30/18 0850  . furosemide (LASIX) injection 40 mg  40 mg Intravenous BID Patrecia Pour, MD   40 mg at 03/30/18 0850  . gabapentin (NEURONTIN) capsule 300 mg  300 mg Oral BID Gwynne Edinger, MD   300 mg at 03/29/18 2242  . HYDROcodone-acetaminophen (NORCO) 7.5-325 MG per tablet 1-2 tablet  1-2 tablet Oral Q6H PRN Wouk, Ailene Rud, MD      . hydrocortisone (CORTEF) tablet 10 mg  10 mg Oral Daily Patrecia Pour, MD      . levothyroxine (SYNTHROID, LEVOTHROID) tablet 200 mcg  200 mcg Oral Daily Gwynne Edinger, MD   200 mcg at 03/30/18 0554  . methocarbamol (ROBAXIN) 500 mg in dextrose 5 % 50 mL IVPB  500 mg Intravenous Q8H PRN Patrecia Pour, MD       Or  . methocarbamol (ROBAXIN) tablet 500 mg  500 mg Oral Q8H PRN Patrecia Pour, MD   500 mg at 03/30/18 0014  . Muscle Rub CREA   Topical PRN Patrecia Pour, MD      . ondansetron [facility]) injection 4 mg  4 mg Intravenous Q6H PRN Patrecia Pour, MD   4 mg at 03/28/18 0027  . potassium chloride SA (K-DUR,KLOR-CON) CR tablet 40 mEq  40 mEq Oral TID Patrecia Pour, MD      . simethicone [facility]) chewable tablet 80 mg  80 mg Oral Q6H PRN Gwynne Edinger, MD   80 mg at  03/28/18 2030  . sodium chloride flush (NS) 0.9 % injection 3 mL  3 mL Intravenous Q12H Wouk, Ailene Rud, MD   3 mL at 03/29/18 2248  .  sodium chloride flush (NS) 0.9 % injection 3 mL  3 mL Intravenous PRN Wouk, Ailene Rud, MD        REVIEW OF SYSTEMS:  [X]  denotes positive finding, [ ]  denotes negative finding Cardiac  Comments:  Chest pain or chest pressure:    Shortness of breath upon exertion:    Short of breath when lying flat:    Irregular heart rhythm:        Vascular    Pain in calf, thigh, or hip brought on by ambulation:    Pain in feet at night that wakes you up from your sleep:     Blood clot in your veins:    Leg swelling:  x       Pulmonary    Oxygen at home:    Productive cough:     Wheezing:         Neurologic    Sudden weakness in arms or legs:     Sudden numbness in arms or legs:     Sudden onset of difficulty speaking or slurred speech:    Temporary loss of vision in one eye:     Problems with dizziness:         Gastrointestinal    Blood in stool:     Vomited blood:         Genitourinary    Burning when urinating:     Blood in urine:        Psychiatric    Major depression:         Hematologic    Bleeding problems:    Problems with blood clotting too easily:        Skin    Rashes or ulcers:        Constitutional    Fever or chills:     PHYSICAL EXAM:   Vitals:   03/29/18 0601 03/29/18 1359 03/29/18 2214 03/30/18 0548  BP: 118/75 110/79 132/85 109/82  Pulse: (!) 48 (!) 51 (!) 105 87  Resp: 17 17 17 17   Temp: 98.6 F (37 C) 98.6 F (37 C) (!) 97.5 F (36.4 C) 98.6 F (37 C)  TempSrc: Oral Oral Oral Oral  SpO2: 96% 94% 96% 96%  Weight:   118.9 kg 118.9 kg  Height:       GENERAL: The patient is a well-nourished male, in no acute distress. The vital signs are documented above. CARDIAC: There is a regular rate and rhythm.  VASCULAR: I do not detect carotid bruits. He has palpable femoral and popliteal pulses. He has significant  bilateral lower extremity swelling and I cannot palpate pedal pulses.  However both feet are warm and well-perfused. PULMONARY: There is good air exchange bilaterally without wheezing or rales. ABDOMEN: Soft and non-tender with normal pitched bowel sounds.  I do not appreciate an abdominal bruit. MUSCULOSKELETAL: There are no major deformities or cyanosis. NEUROLOGIC: No focal weakness or paresthesias are detected. SKIN: There are no ulcers or rashes noted. PSYCHIATRIC: The patient has a normal affect.  DATA:    CT ANGIOGRAM ABDOMEN: I reviewed the CT angiogram that was done on 03/27/2018.  This shows a large left renal vascular malformation within the hilum of the left kidney which measures 3.1 cm x 4.9 cm.  The left renal artery is widely patent and is enlarged.   This was interpreted as showing a circumaortic left renal vein however I do not appreciate this on my review of the films.  The left renal vein appears to  be anterior.   This appears to be a chronic AV malformation or AV fistula.  There is no evidence of bleeding or embolic process.  RENAL ARTERY DUPLEX: I have reviewed the renal artery duplex that was done yesterday.  This shows a normal sized left kidney.  There is minimal stenosis of the left renal artery.  LABS: Creatinine is 0.59.  GFR is greater than 60.

## 2018-03-30 NOTE — Progress Notes (Signed)
    Subjective: Patient reports pain as mild.  Still mobilizing well.  Objective:   VITALS:   Vitals:   03/29/18 0601 03/29/18 1359 03/29/18 2214 03/30/18 0548  BP: 118/75 110/79 132/85 109/82  Pulse: (!) 48 (!) 51 (!) 105 87  Resp: 17 17 17 17   Temp: 98.6 F (37 C) 98.6 F (37 C) (!) 97.5 F (36.4 C) 98.6 F (37 C)  TempSrc: Oral Oral Oral Oral  SpO2: 96% 94% 96% 96%  Weight:   118.9 kg 118.9 kg  Height:       CBC Latest Ref Rng & Units 03/30/2018 03/29/2018 03/28/2018  WBC 4.0 - 10.5 K/uL 6.0 6.0 8.0  Hemoglobin 13.0 - 17.0 g/dL 9.1(L) 8.8(L) 8.8(L)  Hematocrit 39.0 - 52.0 % 28.6(L) 28.7(L) 27.7(L)  Platelets 150 - 400 K/uL 294 273 277   BMP Latest Ref Rng & Units 03/30/2018 03/29/2018 03/28/2018  Glucose 70 - 99 mg/dL 97 99 128(H)  BUN 6 - 20 mg/dL 10 11 13   Creatinine 0.61 - 1.24 mg/dL 0.59(L) 0.62 0.71  Sodium 135 - 145 mmol/L 134(L) 134(L) 131(L)  Potassium 3.5 - 5.1 mmol/L 3.3(L) 3.2(L) 3.0(L)  Chloride 98 - 111 mmol/L 93(L) 95(L) 97(L)  CO2 22 - 32 mmol/L 29 30 25   Calcium 8.9 - 10.3 mg/dL 8.2(L) 7.9(L) 7.9(L)   Intake/Output      03/09 0701 - 03/10 0700 03/10 0701 - 03/11 0700   P.O. 660    I.V. (mL/kg) 0 (0)    Total Intake(mL/kg) 660 (5.6)    Urine (mL/kg/hr) 5050 (1.8)    Stool 0    Total Output 5050    Net -4390         Stool Occurrence 5 x       Physical Exam: General: NAD.  Upright in chair on arrival.  Calm, conversant. MSK RLE: Dressing intact in good condition.  Removed today.  Underlying incision healing Steri-Strips in place.  No surrounding erythema induration or sign of infection. Neurovascularly intact distally Sensation intact distally Feet warm with 2+ edema to calves Dorsiflexion/Plantar flexion intact  Assessment: 2 weeks S/P Right total hip arthroplasty by Dr. Ernesta Amble. Murphy on 03/16/2018  Principal Problem:   Hyponatremia Active Problems:   Acromegaly [facility])   Hypogonadism male   Cervical spondylosis with myelopathy  Hypothyroidism   Chronic adrenal insufficiency (HCC)   Anasarca   Hyperbilirubinemia   Primary osteoarthritis, status post total hip arthroplasty 03/16/2018 Doing well from an orthopedic perspective Pain controlled Continues to mobilize well  Plan: Up with therapy Incentive Spirometry Continue care for Anasarca / medical issues per primary team Plan for transition to OPPT after discharge. Follow up in the office with Dr. Alain Marion in 2 weeks.  Please call with questions.  Weight Bearing: Weight Bearing as Tolerated (WBAT) RLE Dressings: Incision okay left open to air.  Shower okay-clean with soap and water and pat dry.  No submerging incision. VTE prophylaxis: Lovenox while inpatient, ambulation   Prudencio Burly III, PA-C 03/30/2018, 7:25 AM

## 2018-03-31 DIAGNOSIS — Q2730 Arteriovenous malformation, site unspecified: Secondary | ICD-10-CM

## 2018-03-31 LAB — COMPREHENSIVE METABOLIC PANEL
ALT: 34 U/L (ref 0–44)
AST: 24 U/L (ref 15–41)
Albumin: 3 g/dL — ABNORMAL LOW (ref 3.5–5.0)
Alkaline Phosphatase: 115 U/L (ref 38–126)
Anion gap: 10 (ref 5–15)
BUN: 7 mg/dL (ref 6–20)
CO2: 29 mmol/L (ref 22–32)
Calcium: 8.3 mg/dL — ABNORMAL LOW (ref 8.9–10.3)
Chloride: 92 mmol/L — ABNORMAL LOW (ref 98–111)
Creatinine, Ser: 0.58 mg/dL — ABNORMAL LOW (ref 0.61–1.24)
GFR calc Af Amer: >60 mL/min (ref >60)
GFR calc non Af Amer: >60 mL/min (ref >60)
Glucose, Bld: 91 mg/dL (ref 70–99)
Potassium: 3.8 mmol/L (ref 3.5–5.1)
Sodium: 131 mmol/L — ABNORMAL LOW (ref 135–145)
Total Bilirubin: 1.9 mg/dL — ABNORMAL HIGH (ref 0.3–1.2)
Total Protein: 5.2 g/dL — ABNORMAL LOW (ref 6.5–8.1)

## 2018-03-31 LAB — CBC
HCT: 29.4 % — ABNORMAL LOW (ref 39.0–52.0)
Hemoglobin: 8.9 g/dL — ABNORMAL LOW (ref 13.0–17.0)
MCH: 31.4 pg (ref 26.0–34.0)
MCHC: 30.3 g/dL (ref 30.0–36.0)
MCV: 103.9 fL — ABNORMAL HIGH (ref 80.0–100.0)
Platelets: 321 10*3/uL (ref 150–400)
RBC: 2.83 MIL/uL — ABNORMAL LOW (ref 4.22–5.81)
RDW: 16.3 % — ABNORMAL HIGH (ref 11.5–15.5)
WBC: 4.9 10*3/uL (ref 4.0–10.5)
nRBC: 0 % (ref 0.0–0.2)

## 2018-03-31 LAB — MAGNESIUM: Magnesium: 2 mg/dL (ref 1.7–2.4)

## 2018-03-31 MED ORDER — PHENOL 1.4 % MT LIQD
1.0000 | OROMUCOSAL | Status: DC | PRN
  Filled 2018-03-31: qty 177

## 2018-03-31 NOTE — Progress Notes (Signed)
PROGRESS NOTE    Mark Gallagher  BPZ:[id number] DOB: [birth year] DOA: 03/26/2018 PCP: Wendie Agreste, MD    Brief Narrative:  48 y.o. 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 He had been admitted 2/25 for routine right THA by Dr. Percell Miller. Despite empiric stress steroids, he was hypotensive postoperatively, EBL mentioned to be 1.1L, started on neo, given 2u PRBCs and albumin, and transferred to the ICU. Stress steroids were continued and ultimately he was weaned from pressors, required another unit of PRBCs, but ultimately anemia stabilized. Hydrocortisone was weaned from 2/29 through 3/2. He had leg swelling, LUE swelling which was worked up with negative doppler U/S, but by the day of discharge he was up about 30lbs (276lbs on 3/3 and documented 242lbs at office visit on 2/18). He was discharged 3/3, holding losartan and metoprolol pending cardiology follow up, placed on aspirin 81mg  BID for DVT prophylaxis and back on home hydrocortisone 10mg  daily in addition to aleve, hydrocodone, and robaxin with home medications. He was feeling ok at discharge, had had a BM, but had poor appetite, noticed continued swelling. He was going to follow up with cardiology in [place] but felt too nauseated to travel, so followed up with his PCP 3/6 where he reported continued swelling, up to 285lbs, referred to the ED.   In the ED he appeared with anasarca slight jaundice, afebrile, with hyponatremia at 128 down from 138 at recent discharge, creatinine stable, WBC 10.4k, Hgb 10.1g/dl up from 8.1g/dl at recent discharge. Albumin was 3, total bilirubin was 3.4 with normal AST, ALT, and modestly elevated alkaline phosphatase at 153. Urinalysis showed elevated specific gravity with urine sodium <  10, 0-5 WBCs and RBCs on microscopy, there were ketones but no protein or bacteria. CT abd/pelvis demonstrated diffuse anasarca, borderline cardiomegaly with trace pleural effusions, normal hepatobiliary system, flat IVC, moderate-large stool burden, and abnormal dilation of the left renal artery and vein. He was admitted for further work up, restarted on stress dose steroids.   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  - no evidence of CHF on echo. No DVT on U/S previously. - Will continue lasix 40mg  IV BID, diuresing well - Cardiology evaluated the patient 3/8, not now following. - Deescalated steroids. Down to home dose cortef 10mg  po daily 3/10.  - diuresing well with over 3L out thus far. Renal function remains stable - Repeat bmet in AM  Left renal vascular malformation/fistula:  - Noted to have abnormal imaging at admission.  - CTA demonstrated enlarged renal vein with contrast present during arterial phase consistent with AVF. No recent instrumentation.  - Dr. Bonner Puna discussed with vascular surgery, Dr. Carlis Abbott, who recommends just monitoring for now in the setting of normal kidney function, no hematuria/proetinuria and obtaining duplex scan.  - Dr. Bonner Puna discussed with vascular surgery, Dr. Scot Dock. Recommendation for Urology follow up, plan for outpatient referral  Nausea, vomiting: Likely due to gut edema.   - Encourage po, especially protein intake.  - Antiemetics  prn.  Constipation: Resolved.  - Now having loose stools. Stopped laxatives, prefer to avoid imodium to minimize risk of over correction. - Stable at present  Panhypopituitarism s/p  pituitary macroadenoma resection:  - Continue testosterone 100mg  q2weeks at PCP (last 3/6) - Continue synthroid 229mcg daily for now. TSH is low as would be expected, free T4 and free T3 also low, possibly related to sick thyroid syndrome. He does not have clinical evidence of overt hypo/hyperthyroidism. Plan to discuss further with endocrinology, as above.  - Continue octreotide 30mg  q28d - Has remained normotensive prior to stress steroids. Hyponatremia noted without hyperkalemia or metabolic acidosis. - Seems stable at present  Hyperbilirubinemia: Cholestatic pattern with normal AST, ALT, though no hepatobiliary ductal dilatation or parenchymal abnormalities on CT. Urine has been darker.  - Fractionated bilirubin with mixed picture, overall improving. Doubt hemolysis based on stable hgb, platelets.  - ?Due to congestive hepatopathy. Will continue monitoring daily. - Seems stable at present  Aortic root dilatation: 5.1cm on echo 03/27/2018. Per echo report from [place] 11/05/2016, 5.1cm aorta sin. is noted; noted to be 5.5cm 08/07/2015. Severe cardiac enlargement noted as well with EF 50%, normal RV. Also referred to in report of CTA chest 09/12/2005 "prominence of the ascending aorta at the sinuses of Valsalva."  - Will need surveillance/CT angio as outpatient - Currently stable.  Hyponatremia: Multifactorial, though with hypoosmolar state and low UNa, suspect due primarily to intravascular depletion in setting of N/V/poor po. Possibly related to opioids, pain.  - Improving with diuresis, improving po intake. - Repeat bmet in AM  Hypokalemia:  - Replace as needed with diuresis. Currently on 13mEq po KCl replacement TID - Repeat bmet in AM  Acute blood loss anemia: Stable/improved from prior admission.  - Monitor for bleeding - CBC reviewed  Right hip OA s/p THA 2/25:  - Routine postoperative care. Dr. Harvie Heck saw patient 3/10, made routine follow up recommendations.   - Cont DVT  prophylaxis - continue per PT recommendations  History of AFlutter, WPW: Currently in sinus rhythm.  - Presently stable.  Cervical spondylosis: s/p fusions x2, no current myelopathy.  - Continue pain medications prn - Kpad prn - Currently stable   DVT prophylaxis: Lovenox subQ Code Status: Full Family Communication: Pt in room, family at bedside Disposition Plan: Uncertain at this time  Consultants:   Vascular surgery  Procedures:     Antimicrobials: Anti-infectives (From admission, onward)   None       Subjective: Reports feeling better. Denies sob  Objective: Vitals:   03/30/18 2111 03/31/18 0000 03/31/18 0436 03/31/18 1346  BP: (!) 140/92  106/67 129/88  Pulse: 98  88 87  Resp: 18  18 18   Temp: 98.1 F (36.7 C)  98.4 F (36.9 C) 98.6 F (37 C)  TempSrc: Oral  Oral Oral  SpO2: 100%  94% 95%  Weight:  112.9 kg    Height:        Intake/Output Summary (Last 24 hours) at 03/31/2018 1503 Last data filed at 03/31/2018 1416 Gross per 24 hour  Intake 1260 ml  Output 6652 ml  Net -5392 ml   Filed Weights   03/29/18 2214 03/30/18 0548 03/31/18 0000  Weight: 118.9 kg 118.9 kg 112.9 kg    Examination:  General exam: Appears calm and comfortable  Respiratory system: Clear to auscultation. Respiratory effort normal. Cardiovascular system: S1 & S2 heard, RRR. Gastrointestinal system: Abdomen is nondistended, soft and nontender. No organomegaly or masses felt. Normal bowel sounds heard.  Central nervous system: Alert and oriented. No focal neurological deficits. Extremities: Symmetric 5 x 5 power, BLE edema without pitting Skin: No rashes, lesions Psychiatry: Judgement and insight appear normal. Mood & affect appropriate.   Data Reviewed: I have personally reviewed following labs and imaging studies  CBC: Recent Labs  Lab 03/26/18 1514 03/27/18 0218 03/28/18 0407 03/29/18 0344 03/30/18 0422 03/31/18 0529  WBC 10.4 9.3 8.0 6.0 6.0 4.9  NEUTROABS  8.2*  --   --   --   --   --   HGB 10.1* 9.8* 8.8* 8.8* 9.1* 8.9*  HCT 32.9* 31.2* 27.7* 28.7* 28.6* 29.4*  MCV 101.5* 100.0 100.0 101.4* 99.7 103.9*  PLT 300 243 277 273 294 784   Basic Metabolic Panel: Recent Labs  Lab 03/27/18 0218 03/28/18 0407 03/29/18 0344 03/30/18 0422 03/31/18 0529  NA 129* 131* 134* 134* 131*  K 3.6 3.0* 3.2* 3.3* 3.8  CL 95* 97* 95* 93* 92*  CO2 22 25 30 29 29   GLUCOSE 110* 128* 99 97 91  BUN 18 13 11 10 7   CREATININE 0.64  0.65 0.71 0.62 0.59* 0.58*  CALCIUM 8.0* 7.9* 7.9* 8.2* 8.3*  MG  --   --   --   --  2.0   GFR: Estimated Creatinine Clearance: 159.7 mL/min (A) (by C-G formula based on SCr of 0.58 mg/dL (L)). Liver Function Tests: Recent Labs  Lab 03/27/18 0218 03/27/18 1428 03/28/18 0407 03/29/18 0344 03/30/18 0422 03/31/18 0529  AST 32  --  21 18 24 24   ALT 38  --  35 31 36 34  ALKPHOS 162*  --  130* 125 122 115  BILITOT 4.5*  4.5* 3.7* 2.1* 2.3* 2.4* 1.9*  PROT 5.6*  --  5.0* 5.3* 5.7* 5.2*  ALBUMIN 3.2*  --  2.8* 3.0* 3.3* 3.0*   Recent Labs  Lab 03/26/18 1514  LIPASE 24   No results for input(s): AMMONIA in the last 168 hours. Coagulation Profile: Recent Labs  Lab 03/27/18 0218  INR 1.2   Cardiac Enzymes: No results for input(s): CKTOTAL, CKMB, CKMBINDEX, TROPONINI in the last 168 hours. BNP (last 3 results) No results for input(s): PROBNP in the last 8760 hours. HbA1C: No results for input(s): HGBA1C in the last 72 hours. CBG: No results for input(s): GLUCAP in the last 168 hours. Lipid Profile: No results for input(s): CHOL, HDL, LDLCALC, TRIG, CHOLHDL, LDLDIRECT in the last 72 hours. Thyroid Function Tests: Recent Labs    03/29/18 0344  T3FREE 0.9*   Anemia Panel: No results for input(s): VITAMINB12, FOLATE, FERRITIN, TIBC, IRON, RETICCTPCT in the last 72 hours. Sepsis Labs: No results for input(s): PROCALCITON, LATICACIDVEN in the last 168 hours.  No results found for this or any previous visit (from  the past 240 hour(s)).   Radiology Studies: No results found.  Scheduled Meds: . enoxaparin (LOVENOX) injection  80 mg Subcutaneous Daily  . ferrous sulfate  325 mg Oral BID WC  . furosemide  40 mg Intravenous BID  . gabapentin  300 mg Oral BID  . hydrocortisone  10 mg Oral Daily  . levothyroxine  200 mcg Oral Daily  . potassium chloride  40 mEq Oral TID  . psyllium  1 packet Oral Daily  . sodium chloride flush  3 mL Intravenous Q12H   Continuous Infusions: . sodium chloride    . methocarbamol (ROBAXIN) IV       LOS: 5 days   Marylu Lund, MD [facility] Pager On Amion  If 7PM-7AM, please contact night-coverage 03/31/2018, 3:03 PM

## 2018-03-31 NOTE — Progress Notes (Signed)
OT Cancellation Note  Patient Details Name: ERUBIEL MANASCO MRN: [id number] DOB: [birth year]   Cancelled Treatment:    Reason Eval/Treat Not Completed: Fatigue/lethargy limiting ability to participate  Pt back in bed and ask OT to return at a later time or next day Kari Baars, [place] [phone] Office- [phone], Edwena Felty D 03/31/2018, 3:24 PM

## 2018-04-01 LAB — COMPREHENSIVE METABOLIC PANEL
ALT: 28 U/L (ref 0–44)
AST: 19 U/L (ref 15–41)
Albumin: 3.1 g/dL — ABNORMAL LOW (ref 3.5–5.0)
Alkaline Phosphatase: 127 U/L — ABNORMAL HIGH (ref 38–126)
Anion gap: 10 (ref 5–15)
BUN: 6 mg/dL (ref 6–20)
CO2: 29 mmol/L (ref 22–32)
Calcium: 8.2 mg/dL — ABNORMAL LOW (ref 8.9–10.3)
Chloride: 90 mmol/L — ABNORMAL LOW (ref 98–111)
Creatinine, Ser: 0.67 mg/dL (ref 0.61–1.24)
GFR calc Af Amer: >60 mL/min (ref >60)
GFR calc non Af Amer: >60 mL/min (ref >60)
Glucose, Bld: 94 mg/dL (ref 70–99)
Potassium: 3.9 mmol/L (ref 3.5–5.1)
Sodium: 129 mmol/L — ABNORMAL LOW (ref 135–145)
Total Bilirubin: 1.8 mg/dL — ABNORMAL HIGH (ref 0.3–1.2)
Total Protein: 5.5 g/dL — ABNORMAL LOW (ref 6.5–8.1)

## 2018-04-01 MED ORDER — FUROSEMIDE 40 MG PO TABS: 40.0000 mg | ORAL_TABLET | Freq: Every day | ORAL | 0 refills | Status: DC

## 2018-04-01 NOTE — Progress Notes (Signed)
Reviewed d/c instructions with patient and his parents. All verbalized understanding of instructions. Patient w/ d/c to home when ride available, patient does not fit comfortably in parent's car. Will cont to monitor.

## 2018-04-01 NOTE — Discharge Summary (Addendum)
Physician Discharge Summary  Mark Gallagher ZOX:[id number] DOB: August 06, 1970 DOA: 03/26/2018  PCP: Wendie Agreste, MD  Admit date: 03/26/2018 Discharge date: 04/01/2018  Admitted From: Home Disposition:  Home  Recommendations for Outpatient Follow-up:  1. Follow up with PCP in 1-2 weeks 2. Recommend follow up with Urology 3. 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 He had been admitted 2/25 for routine right THA by Dr. Percell Miller. Despite empiric stress steroids, he was hypotensive postoperatively, EBL mentioned to be 1.1L, started on neo, given 2u PRBCs and albumin, and transferred to the ICU. Stress steroids were continued and ultimately he was weaned from pressors, required another unit of PRBCs, but ultimately anemia stabilized. Hydrocortisone was weaned from 2/29 through 3/2. He had leg swelling, LUE swelling which was worked up with negative doppler U/S, but by the day of discharge he was up about 30lbs (276lbs on 3/3 and documented 242lbs at office visit on 2/18). He was discharged 3/3, holding losartan and metoprolol pending cardiology follow up, placed on aspirin 81mg  BID for DVT prophylaxis and back on home hydrocortisone 10mg  daily in addition to aleve, hydrocodone, and robaxin with home medications. He was feeling ok at discharge, had had a BM, but had poor appetite, noticed continued swelling. He was going to follow up with cardiology in [place] but felt too nauseated to travel, so followed up with his PCP 3/6  where he reported continued swelling, up to 285lbs, referred to the ED.   In the ED he appeared with anasarca slight jaundice, afebrile, with hyponatremia at 128 down from 138 at recent discharge, creatinine stable, WBC 10.4k, Hgb 10.1g/dl up from 8.1g/dl at recent discharge. Albumin was 3, total bilirubin was 3.4 with normal AST, ALT, and modestly elevated alkaline phosphatase at 153. Urinalysis showed elevated specific gravity with urine sodium < 10, 0-5 WBCs and RBCs on microscopy, there were ketones but no protein or bacteria. CT abd/pelvis demonstrated diffuse anasarca, borderline cardiomegaly with trace pleural effusions, normal hepatobiliary system, flat IVC, moderate-large stool burden, and abnormal dilation of the left renal artery and vein. He was admitted for further work up, restarted on stress dose steroids.  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  - no evidence of CHF on echo. No DVT on U/S previously. -Will continue lasix 40mg  IV BID, diuresing well - Cardiology evaluated the patient 3/8, not now following. - Deescalatedsteroids. Down to home dose cortef 10mg  po daily 3/10.  - diuresed very well with IV lasix. Renal function remains stable - Will discharge home with PO lasix  Left renal vascular malformation/fistula:  - Noted to have abnormal imaging at admission.  - CTA demonstrated enlarged renal vein with contrast present during arterial phase consistent with AVF. No recent instrumentation.  - Dr. Bonner Puna discussed with  vascular surgery, Dr. Carlis Abbott, who recommends just monitoring for now in the setting of normal kidney function, no  hematuria/proetinuriaand obtaining duplex scan. - Dr. Bonner Puna discussed with vascular surgery, Dr. Scot Dock. Recommendation for Urology follow up, plan for outpatient referral. Pt is asymptomatic. If patient later becomes symptomatic, Vascular surgery recommends nephrectomy at that time  Nausea, vomiting: Likely due to gut edema.  - Encourage po, especially protein intake.  - Antiemetics prn.  Constipation: Resolved.  - Now having loose stools.Stopped laxatives, prefer to avoid imodium to minimize risk of over correction. - Stable at present  Panhypopituitarism s/p pituitary macroadenoma resection:  - Continue testosterone 100mg  q2weeks at PCP (last 3/6) - Continue synthroid 259mcg daily for now. 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 Plan todiscuss further with endocrinology, as above.  - Continue octreotide 30mg  q28d - Has remained normotensive prior to stress steroids. Hyponatremia noted without hyperkalemia or metabolic acidosis. - Seems stable at present  Hyperbilirubinemia: Cholestatic pattern with normal AST, ALT, though no hepatobiliary ductal dilatation or parenchymal abnormalities on CT. Urine has been darker.  - Fractionated bilirubin with mixed picture, overall improving. Doubt hemolysis based on stable hgb, platelets.  - ?Due to congestive hepatopathy. Will continue monitoring daily. - Seems stable at present  Aortic root dilatation: 5.1cm on echo 03/27/2018. Per echo report from [facility] 11/05/2016, 5.1cm aorta sin. is noted; noted to be 5.5cm 08/07/2015. Severe cardiac enlargement noted as well with EF 50%, normal RV. Also referred to in report of CTA chest 09/12/2005 "prominence of the ascending aorta at the sinuses of Valsalva."  - Will need surveillance/CT angio as outpatient - Currently stable.  Hyponatremia: Multifactorial, though with  hypoosmolar state and low UNa, suspect due primarily tointravascular depletionin setting of N/V/poor po. Possibly related to opioids, pain.  -Improving with diuresis, improving po intake.  Hypokalemia:  - Replace as needed with diuresis. Currently on 43mEq po KCl replacement TID  Acute blood loss anemia: Stable/improved from prior admission.  - Monitor for bleeding - CBC reviewed  Right hip OA s/p THA 2/25:  - Routine postoperative care.Dr. Harvie Heck saw patient 3/10, made routine follow up recommendations. - Cont DVT prophylaxis - continue per PT recommendations. Recommendations for home with home health. Patient has declined and instead had already arranged outpatient PT prior to admit and plans to follow through  History of AFlutter, clinically undetermined, WPW: Currently in sinus rhythm.  - Presently stable.  Cervical spondylosis: s/p fusions x2, no current myelopathy.  - Continue pain medications prn - Kpad prn - Currently stable   Discharge Instructions   Allergies as of 04/01/2018      Reactions   Oxycodone Nausea Only      Medication List    STOP taking these medications   losartan 25 MG tablet Commonly known as:  COZAAR   metoprolol succinate 25 MG 24 hr tablet Commonly known as:  TOPROL-XL     TAKE these medications   aspirin EC 81 MG tablet Take 1 tablet (81 mg total) by mouth 2 (two) times daily. For DVT prophylaxis for 30 days after surgery.   atorvastatin 40 MG tablet Commonly known as:  LIPITOR Take 40 mg by mouth daily.   CENTRUM SILVER PO Take 1 tablet by mouth daily.   docusate sodium 250 MG capsule Commonly known as:  COLACE Take 250 mg by mouth daily.   eplerenone 50 MG tablet Commonly  known as:  INSPRA Take 50 mg by mouth daily.   ferrous sulfate 325 (65 FE) MG tablet Take 1 tablet (325 mg total) by mouth 2 (two) times daily with a meal.   furosemide 40 MG tablet Commonly known as:  Lasix Take 1 tablet (40 mg total) by  mouth daily for 30 days.   gabapentin 300 MG capsule Commonly known as:  Neurontin Take 1 capsule (300 mg total) by mouth 2 (two) times daily for 14 days. For 2 weeks post op for pain.   HYDROcodone-acetaminophen 7.5-325 MG tablet Commonly known as:  NORCO Take 1-2 tablets by mouth every 6 (six) hours as needed for moderate pain.   hydrocortisone 10 MG tablet Commonly known as:  CORTEF Take 1 tablet daily.  You may take 2 tablets daily with excessive weakness and tiredness and lethargy. What changed:    how much to take  how to take this  when to take this  additional instructions   levothyroxine 200 MCG tablet Commonly known as:  SYNTHROID, LEVOTHROID Take 1 tablet (200 mcg total) by mouth daily.   methocarbamol 750 MG tablet Commonly known as:  Robaxin-750 Take 1 tablet (750 mg total) by mouth every 6 (six) hours as needed for muscle spasms.   naproxen sodium 220 MG tablet Commonly known as:  ALEVE Take 220-440 mg by mouth daily as needed (for pain or headache).   octreotide 30 MG injection Commonly known as:  SANDOSTATIN LAR Inject 30 mg into the muscle every 28 (twenty-eight) days.   ondansetron 4 MG tablet Commonly known as:  Zofran Take 1 tablet (4 mg total) by mouth every 8 (eight) hours as needed for nausea or vomiting.   polyethylene glycol packet Commonly known as:  MIRALAX / GLYCOLAX Take 17 g by mouth daily as needed for mild constipation.   simethicone 80 MG chewable tablet Commonly known as:  MYLICON Chew 80 mg by mouth every 6 (six) hours as needed for flatulence.   testosterone cypionate 200 MG/ML injection Commonly known as:  DEPOTESTOSTERONE CYPIONATE Inject 1 mL (200 mg total) into the muscle every 14 (fourteen) days.   Vitamin D3 25 MCG (1000 UT) Caps Take 2,000 Units by mouth daily.      Follow-up Information    Wendie Agreste, MD. Schedule an appointment as soon as possible for a visit in 1 week(s).   Specialties:  Family  Medicine, Sports Medicine Contact information: [place] [address] [phone]          Allergies  Allergen Reactions  . Oxycodone Nausea Only    Consultations:  Vascular Surgery  Cardiology  Procedures/Studies: Dg Chest 2 View  Result Date: 03/26/2018 CLINICAL DATA:  Lower extremity edema and shortness of breath EXAM: CHEST - 2 VIEW COMPARISON:  None. FINDINGS: There is left basilar atelectasis. Cardiomediastinal contours are normal. No pleural effusion or pneumothorax. No pulmonary edema or focal airspace consolidation. IMPRESSION: Left basilar atelectasis. Electronically Signed   By: Ulyses Jarred M.D.   On: 03/26/2018 16:57   Dg Pelvis 1-2 Views  Result Date: 03/17/2018 CLINICAL DATA:  Arthroplasty, pelvic pain EXAM: PELVIS - 1-2 VIEW COMPARISON:  03/16/2018 FINDINGS: There appears to be some irregularity in the inferolateral iliac bone just superior to the acetabulum on one view. Cannot exclude fracture. Recommend dedicated right hip images. Changes of right hip replacement partially imaged. Moderate to advanced degenerative changes in the left hip. IMPRESSION: Changes of right hip replacement. Cortical irregularity in the lateral inferior  right iliac bone just superior to the acetabulum. Cannot exclude fracture. Recommend dedicated right hip images. Electronically Signed   By: Rolm Baptise M.D.   On: 03/17/2018 18:59   Ct Angio Abdomen W &/or Wo Contrast  Result Date: 03/28/2018 CLINICAL DATA:  Left renal vascular malformation by CT 03/26/2018 EXAM: CT ANGIOGRAPHY ABDOMEN TECHNIQUE: Multidetector CT imaging of the abdomen was performed using the standard protocol during bolus administration of intravenous contrast. Multiplanar reconstructed images and MIPs were obtained and reviewed to evaluate the vascular anatomy. CONTRAST:  129mL OMNIPAQUE IOHEXOL 350 MG/ML SOLN COMPARISON:  03/26/2018 FINDINGS: VASCULAR Aorta: Intact aorta and normal caliber. Negative for  aneurysm, dissection, occlusive process or retroperitoneal hemorrhage. Celiac: Remains patent with slight proximal tortuosity. Splenic, left gastric, and hepatic vasculature all patent. SMA: Widely patent including its branches Renals: Normal appearing right renal artery. No accessory renal vasculature. Left renal artery is widely patent and enlarged. Within the left hilum, there is an abnormal tortuous vascular structure with some scattered wall calcifications measuring 3.1 x 4.9 cm. There is and associated very large dilated draining renal vein. Renal vein is also circumaortic in configuration. Appearance is compatible with a chronic appearing left renal vascular malformation, most consistent with a renal AVF. No evidence of acute bleeding or embolic process. No renal infarcts. IMA: Widely patent off the distal aorta including its branches Inflow: Not assessed Veins: No veno-occlusive process Review of the MIP images confirms the above findings. NON-VASCULAR Lower chest: Bibasilar atelectasis and small pleural effusions. Hepatobiliary: No focal liver abnormality is seen. No gallstones, gallbladder wall thickening, or biliary dilatation. Pancreas: Unremarkable. No pancreatic ductal dilatation or surrounding inflammatory changes. Spleen: Normal in size without focal abnormality. Adrenals/Urinary Tract: Normal adrenal glands. No renal obstruction, hydronephrosis, acute obstructive uropathy. No hydroureter. Left renal hilum vascular malformation as described above. Stomach/Bowel: Similar moderate colonic distension and large volume of retained stool in the right colon. Small bowel nondilated. No definite obstruction pattern. No free air, fluid collection, abscess, or ascites. Lymphatic: No bulky adenopathy. Other: Intact abdominal wall. No hernia. Body subcutaneous edema evident compatible with anasarca. Musculoskeletal: Degenerative changes of the spine. 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 No evidence of acute arterial bleeding or renal embolic process. NON-VASCULAR No other acute intra-abdominal vascular finding. Nonspecific colonic distension Bibasilar atelectasis and trace pleural effusions Body anasarca Electronically Signed   By: Jerilynn Mages.  Shick M.D.   On: 03/28/2018 09:05   Ct Abdomen Pelvis W Contrast  Result Date: 03/26/2018 CLINICAL DATA:  Postop right hip arthroplasty on 03/16/2018. Patient presents with bilateral leg edema and fluid retention. Soft tissue edema diffusely. EXAM: CT ABDOMEN AND PELVIS WITH CONTRAST TECHNIQUE: Multidetector CT imaging of the abdomen and pelvis was performed using the standard protocol following bolus administration of intravenous contrast. CONTRAST:  144mL ISOVUE-300 IOPAMIDOL (ISOVUE-300) INJECTION 61% COMPARISON:  Lumbar spine MRI 01/21/2018 FINDINGS: Lower chest: Borderline cardiomegaly without pericardial effusion. Small hiatal hernia. Bibasilar atelectasis left greater than right. Trace bilateral pleural effusions. Hepatobiliary: No focal liver abnormality is seen. No gallstones, gallbladder wall thickening, or biliary dilatation. Pancreas: Unremarkable. No pancreatic ductal dilatation or surrounding inflammatory changes. Spleen: Normal in size without focal abnormality. Adrenals/Urinary Tract: Normal bilateral adrenal glands. No nephrolithiasis is identified no obstructive uropathy or enhancing renal mass. The urinary bladder is physiologically distended without focal mural thickening or calculi. Stomach/Bowel: There  is a small hiatal hernia. Contrast is noted within the stomach without significant distention. The duodenal bulb, sweep and ligament of Treitz are normal. No significant small bowel dilatation is seen. Moderate to large amount of retained stool is seen within the right colon with gaseous distention of the  transverse and proximal descending colon. No mechanical large bowel obstruction is noted. Vascular/Lymphatic: There is an unusual appearance of the left renal vein which is slightly hyperdense and dilated in appearance up to 3.2 cm AP. Similarly the left renal artery is prominent and dilated up to 1 cm in diameter. There are coarse calcifications noted in the left renal hilum and the possibility of an renal AV fistula or malformation accounting for this appearance is raised. The left renal vein is circumaortic. The right renal vein and artery are unremarkable. The IVC is flat and nondistended. No lymphadenopathy. Reproductive: Normal size prostate and seminal vesicles. Other: Diffuse subcutaneous soft tissue edema consistent with anasarca is noted of the body wall and extending into the thighs. Moderate free fluid in the pelvis. Musculoskeletal: Susceptibility artifacts from the patient's right hip arthroplasty with postop change accounting for a few dots of air along the anterior aspect of the thigh are identified. Faint hyperdensities noted within the thigh musculature are noted on series 2/119 felt secondary to streak artifacts and less likely due to hemorrhage. No hyperdense fluid is seen within the musculature or significant hematoma. Chronic anterior compression of T12. Multilevel thoracolumbar degenerative disc disease. IMPRESSION: 1. Unusually dilated left renal vein and left renal artery with dystrophic calcifications in the left renal hilum may represent the presence of a renal AV fistula or malformation. Conceivably this could contribute to heart failure and contribute to the development of soft tissue edema/anasarca and free fluid within the pelvis. Suggest CT angiogram of the abdomen to rule out a left renal AVF. 2. Postop change of the right thigh status post hip arthroplasty with postop soft tissue tiny foci of emphysema and edema. 3. Trace bilateral pleural effusions with atelectasis. Borderline  cardiomegaly. Electronically Signed   By: Ashley Royalty M.D.   On: 03/26/2018 22:20   Dg C-arm 1-60 Min-no Report  Result Date: 03/16/2018 CLINICAL DATA:  48 year old male for right hip replacement. Initial encounter. EXAM: OPERATIVE right HIP (WITH PELVIS IF PERFORMED) 2 VIEWS TECHNIQUE: Fluoroscopic spot image(s) were submitted for interpretation post-operatively. Fluoroscopic time: 28 seconds. COMPARISON:  12/15/2016. FINDINGS: Two intraoperative C-arm views submitted for review after surgery. This reveals right hip prosthesis in place which appears in satisfactory position without complication noted on frontal imaging. This can be assessed on follow-up. IMPRESSION: Post total right hip replacement. Electronically Signed   By: Genia Del M.D.   On: 03/16/2018 14:06   Dg Hip Operative Unilat W Or W/o Pelvis Right  Result Date: 03/16/2018 CLINICAL DATA:  48 year old male for right hip replacement. Initial encounter. EXAM: OPERATIVE right HIP (WITH PELVIS IF PERFORMED) 2 VIEWS TECHNIQUE: Fluoroscopic spot image(s) were submitted for interpretation post-operatively. Fluoroscopic time: 28 seconds. COMPARISON:  12/15/2016. FINDINGS: Two intraoperative C-arm views submitted for review after surgery. This reveals right hip prosthesis in place which appears in satisfactory position without complication noted on frontal imaging. This can be assessed on follow-up. IMPRESSION: Post total right hip replacement. Electronically Signed   By: Genia Del M.D.   On: 03/16/2018 14:06   Vas [place] Lower Extremity Venous (dvt)  Result Date: 03/20/2018  Lower Venous Study Indications: Pain, and Swelling.  Risk Factors: Surgery Right Hip. Performing Technologist:  SLAUGHTER, [place], D  Examination Guidelines: A complete evaluation includes B-mode imaging, spectral Doppler, color Doppler, and power Doppler as needed of all accessible portions of each vessel. Bilateral testing is considered an integral part of a complete  examination. Limited examinations for reoccurring indications may be performed as noted.  Right Venous Findings: +---------+---------------+---------+-----------+----------+------------------+          CompressibilityPhasicitySpontaneityPropertiesSummary            +---------+---------------+---------+-----------+----------+------------------+ CFV      Full           Yes      Yes                                     +---------+---------------+---------+-----------+----------+------------------+ SFJ      Full                                                            +---------+---------------+---------+-----------+----------+------------------+ FV Prox  Full           Yes      Yes                                     +---------+---------------+---------+-----------+----------+------------------+ FV Mid   Full                                                            +---------+---------------+---------+-----------+----------+------------------+ FV DistalFull           Yes      Yes                                     +---------+---------------+---------+-----------+----------+------------------+ PFV      Full           Yes      Yes                                     +---------+---------------+---------+-----------+----------+------------------+ POP      Full           Yes      Yes                                     +---------+---------------+---------+-----------+----------+------------------+ PTV      Full                                                            +---------+---------------+---------+-----------+----------+------------------+ PERO     Full  Difficult to image +---------+---------------+---------+-----------+----------+------------------+  Left Venous Findings: +---------+---------------+---------+-----------+----------+-------------------+           CompressibilityPhasicitySpontaneityPropertiesSummary             +---------+---------------+---------+-----------+----------+-------------------+ CFV      Full           Yes      Yes                                      +---------+---------------+---------+-----------+----------+-------------------+ SFJ      Full                                                             +---------+---------------+---------+-----------+----------+-------------------+ FV Prox  Full           Yes      Yes                                      +---------+---------------+---------+-----------+----------+-------------------+ FV Mid   Full                                                             +---------+---------------+---------+-----------+----------+-------------------+ FV DistalFull           Yes      Yes                                      +---------+---------------+---------+-----------+----------+-------------------+ PFV      Full           Yes      Yes                                      +---------+---------------+---------+-----------+----------+-------------------+ POP      Full           Yes      Yes                                      +---------+---------------+---------+-----------+----------+-------------------+ PTV      Full                                                             +---------+---------------+---------+-----------+----------+-------------------+ PERO                                                  Unable to image  well enough to                                                            evaluate            +---------+---------------+---------+-----------+----------+-------------------+  Left Technical Findings: Not visualized segments include peroneal. Unable to image well enough to fully evaluate.   Summary: Right: There is no evidence of deep vein thrombosis  in the lower extremity. No cystic structure found in the popliteal fossa. Left: There is no evidence of deep vein thrombosis in the veins able to be fully evaluated in the lower extremity. No cystic structure found in the popliteal fossa. See technical findings listed above.  *See table(s) above for measurements and observations. Electronically signed by Servando Snare MD on 03/20/2018 at 1:24:05 PM.    Final    Vas US Renal Artery Duplex  Result Date: 03/29/2018 ABDOMINAL VISCERAL Limitations: Air/bowel gas and patient discomfort. Performing Technologist: Oliver Hum RVT  Examination Guidelines: A complete evaluation includes B-mode imaging, spectral Doppler, color Doppler, and power Doppler as needed of all accessible portions of each vessel. Bilateral testing is considered an integral part of a complete examination. Limited examinations for reoccurring indications may be performed as noted.  Duplex Findings: +----------+--------+--------+------+--------+ MesentericPSV cm/sEDV cm/sPlaqueComments +----------+--------+--------+------+--------+ Aorta Mid   115      35                  +----------+--------+--------+------+--------+  +-----------------+--------+--------+-------+ Left Renal ArteryPSV cm/sEDV cm/sComment +-----------------+--------+--------+-------+ Origin              55      23           +-----------------+--------+--------+-------+ Proximal            81      35           +-----------------+--------+--------+-------+ Mid                 45      28           +-----------------+--------+--------+-------+ Distal              68      36           +-----------------+--------+--------+-------+ Technologist observations Renal Artery(s):Prox, mid, and distal renal vein waveforms appear pulsatile. +------------+--------+--------+--+-----------+--------+--------+----+ Right KidneyPSV cm/sEDV cm/sRILeft KidneyPSV cm/sEDV cm/sRI    +------------+--------+--------+--+-----------+--------+--------+----+ Upper Pole                    Upper Pole 123     61      0.51 +------------+--------+--------+--+-----------+--------+--------+----+ Mid                           Mid        146     88      0.40 +------------+--------+--------+--+-----------+--------+--------+----+ Lower Pole                    Lower Pole 39      8       0.78 +------------+--------+--------+--+-----------+--------+--------+----+ Hilar                         Hilar      139  80      0.42 +------------+--------+--------+--+-----------+--------+--------+----+ +------------------++------------------+-----+ Right Kidney      Left Kidney             +------------------++------------------+-----+ RAR               RAR                     +------------------++------------------+-----+ RAR (manual)      RAR (manual)      0.7   +------------------++------------------+-----+ Cortex            Cortex                  +------------------++------------------+-----+ Cortex thickness  Corex thickness         +------------------++------------------+-----+ Kidney length (cm)Kidney length (cm)11.50 +------------------++------------------+-----+  Summary: Renal:  Left: Normal size of left kidney. Normal left Resistive Index. 1-59%       stenosis of the left renal artery.  *See table(s) above for measurements and observations.  Diagnosing physician: Ruta Hinds MD  Electronically signed by Ruta Hinds MD on 03/29/2018 at 5:06:39 PM.    Final    Vas [place] Upper Extremity Venous Duplex  Result Date: 03/20/2018 UPPER VENOUS STUDY  Indications: Swelling Performing Technologist: Toma Copier RVS  Examination Guidelines: A complete evaluation includes B-mode imaging, spectral Doppler, color Doppler, and power Doppler as needed of all accessible portions of each vessel. Bilateral testing is considered an integral part of a  complete examination. Limited examinations for reoccurring indications may be performed as noted.  Left Findings: +--------+------------+----------+---------+-----------+-------+ LEFT    CompressiblePropertiesPhasicitySpontaneousSummary +--------+------------+----------+---------+-----------+-------+ IJV         Full                 Yes       Yes            +--------+------------+----------+---------+-----------+-------+ Axillary    Full                 Yes       Yes            +--------+------------+----------+---------+-----------+-------+ Brachial    Full                 Yes       Yes            +--------+------------+----------+---------+-----------+-------+ Radial      Full                                          +--------+------------+----------+---------+-----------+-------+ Ulnar       Full                                          +--------+------------+----------+---------+-----------+-------+ Cephalic    Full                                          +--------+------------+----------+---------+-----------+-------+ Basilic     Full                                          +--------+------------+----------+---------+-----------+-------+  Summary:  Left: No evidence of deep vein thrombosis in the upper extremity. No evidence of superficial vein thrombosis in the upper extremity. No evidence of thrombosis in the subclavian.  *See table(s) above for measurements and observations.  Diagnosing physician: Servando Snare MD Electronically signed by Servando Snare MD on 03/20/2018 at 1:23:41 PM.    Final      Subjective: Eager to go home  Discharge Exam: Vitals:   03/31/18 2047 04/01/18 0628  BP: (!) 127/92 124/73  Pulse: (!) 102 83  Resp: 18 18  Temp: 99.1 F (37.3 C) 98.2 F (36.8 C)  SpO2: 96% 94%   Vitals:   03/31/18 1346 03/31/18 2047 04/01/18 0315 04/01/18 0628  BP: 129/88 (!) 127/92  124/73  Pulse: 87 (!) 102  83  Resp: 18 18  18   Temp:  98.6 F (37 C) 99.1 F (37.3 C)  98.2 F (36.8 C)  TempSrc: Oral Oral  Oral  SpO2: 95% 96%  94%  Weight:  111.9 kg 109.8 kg   Height:        General: Pt is alert, awake, not in acute distress Cardiovascular: RRR, S1/S2 +, no rubs, no gallops Respiratory: CTA bilaterally, no wheezing, no rhonchi Abdominal: Soft, NT, ND, bowel sounds + Extremities: no edema, no cyanosis   The results of significant diagnostics from this hospitalization (including imaging, microbiology, ancillary and laboratory) are listed below for reference.     Microbiology: No results found for this or any previous visit (from the past 240 hour(s)).   Labs: BNP (last 3 results) Recent Labs    03/26/18 1514  BNP 626.9*   Basic Metabolic Panel: Recent Labs  Lab 03/28/18 0407 03/29/18 0344 03/30/18 0422 03/31/18 0529 04/01/18 0455  NA 131* 134* 134* 131* 129*  K 3.0* 3.2* 3.3* 3.8 3.9  CL 97* 95* 93* 92* 90*  CO2 25 30 29 29 29   GLUCOSE 128* 99 97 91 94  BUN 13 11 10 7 6   CREATININE 0.71 0.62 0.59* 0.58* 0.67  CALCIUM 7.9* 7.9* 8.2* 8.3* 8.2*  MG  --   --   --  2.0  --    Liver Function Tests: Recent Labs  Lab 03/28/18 0407 03/29/18 0344 03/30/18 0422 03/31/18 0529 04/01/18 0455  AST 21 18 24 24 19   ALT 35 31 36 34 28  ALKPHOS 130* 125 122 115 127*  BILITOT 2.1* 2.3* 2.4* 1.9* 1.8*  PROT 5.0* 5.3* 5.7* 5.2* 5.5*  ALBUMIN 2.8* 3.0* 3.3* 3.0* 3.1*   Recent Labs  Lab 03/26/18 1514  LIPASE 24   No results for input(s): AMMONIA in the last 168 hours. CBC: Recent Labs  Lab 03/26/18 1514 03/27/18 0218 03/28/18 0407 03/29/18 0344 03/30/18 0422 03/31/18 0529  WBC 10.4 9.3 8.0 6.0 6.0 4.9  NEUTROABS 8.2*  --   --   --   --   --   HGB 10.1* 9.8* 8.8* 8.8* 9.1* 8.9*  HCT 32.9* 31.2* 27.7* 28.7* 28.6* 29.4*  MCV 101.5* 100.0 100.0 101.4* 99.7 103.9*  PLT 300 243 277 273 294 321   Cardiac Enzymes: No results for input(s): CKTOTAL, CKMB, CKMBINDEX, TROPONINI in the last 168  hours. BNP: Invalid input(s): POCBNP CBG: No results for input(s): GLUCAP in the last 168 hours. D-Dimer No results for input(s): DDIMER in the last 72 hours. Hgb A1c No results for input(s): HGBA1C in the last 72 hours. Lipid Profile No results for input(s): CHOL, HDL, LDLCALC, TRIG, CHOLHDL, LDLDIRECT in the last 72 hours. Thyroid function  studies No results for input(s): TSH, T4TOTAL, T3FREE, THYROIDAB in the last 72 hours.  Invalid input(s): FREET3 Anemia work up No results for input(s): VITAMINB12, FOLATE, FERRITIN, TIBC, IRON, RETICCTPCT in the last 72 hours. Urinalysis    Component Value Date/Time   COLORURINE AMBER (A) 03/26/2018 2343   APPEARANCEUR CLEAR 03/26/2018 2343   LABSPEC 1.041 (H) 03/26/2018 2343   PHURINE 5.0 03/26/2018 2343   GLUCOSEU NEGATIVE 03/26/2018 2343   HGBUR SMALL (A) 03/26/2018 2343   BILIRUBINUR SMALL (A) 03/26/2018 2343   KETONESUR 20 (A) 03/26/2018 2343   PROTEINUR NEGATIVE 03/26/2018 2343   NITRITE NEGATIVE 03/26/2018 2343   LEUKOCYTESUR NEGATIVE 03/26/2018 2343   Sepsis Labs Invalid input(s): PROCALCITONIN,  WBC,  LACTICIDVEN Microbiology No results found for this or any previous visit (from the past 240 hour(s)).  Time spent: 30 min  SIGNED:   Marylu Lund, MD  [facility] 04/01/2018, 12:33 PM  If 7PM-7AM, please contact night-coverage

## 2018-04-01 NOTE — Progress Notes (Signed)
Patient discharged to home w/ family. Given all belongings, instructions, prescriptions, equipment. Escorted to pov via w/c.

## 2018-04-06 DIAGNOSIS — Q2734 Arteriovenous malformation of renal vessel: Secondary | ICD-10-CM | POA: Diagnosis not present

## 2018-04-06 DIAGNOSIS — I7789 Other specified disorders of arteries and arterioles: Secondary | ICD-10-CM | POA: Insufficient documentation

## 2018-04-07 DIAGNOSIS — I471 Supraventricular tachycardia: Secondary | ICD-10-CM | POA: Diagnosis not present

## 2018-04-07 DIAGNOSIS — R0602 Shortness of breath: Secondary | ICD-10-CM | POA: Diagnosis not present

## 2018-04-07 DIAGNOSIS — I7789 Other specified disorders of arteries and arterioles: Secondary | ICD-10-CM | POA: Diagnosis not present

## 2018-04-07 DIAGNOSIS — I428 Other cardiomyopathies: Secondary | ICD-10-CM | POA: Diagnosis not present

## 2018-04-07 DIAGNOSIS — I483 Typical atrial flutter: Secondary | ICD-10-CM | POA: Diagnosis not present

## 2018-04-09 ENCOUNTER — Ambulatory Visit (INDEPENDENT_AMBULATORY_CARE_PROVIDER_SITE_OTHER): Payer: BLUE CROSS/BLUE SHIELD | Admitting: Family Medicine

## 2018-04-09 ENCOUNTER — Other Ambulatory Visit: Payer: Self-pay

## 2018-04-09 DIAGNOSIS — E220 Acromegaly and pituitary gigantism: Secondary | ICD-10-CM

## 2018-04-09 DIAGNOSIS — E291 Testicular hypofunction: Secondary | ICD-10-CM

## 2018-04-09 MED ORDER — TESTOSTERONE CYPIONATE 200 MG/ML IM SOLN
100.0000 mg | Freq: Once | INTRAMUSCULAR | Status: AC
  Administered 2018-04-09: 100 mg via INTRAMUSCULAR

## 2018-04-09 MED ORDER — OCTREOTIDE ACETATE 30 MG IM KIT
30.0000 mg | PACK | Freq: Once | INTRAMUSCULAR | Status: AC
  Administered 2018-04-09: 30 mg via INTRAMUSCULAR

## 2018-04-09 NOTE — Progress Notes (Signed)
Patient for injections of Sandostatin LAR 30 mg and testosterone 0.5 mls. Administered Sandostatin to the right upper outer quadrant and testosterone 0.5 mls left upper outer quadrant. Patient tolerated the injections well.

## 2018-04-12 DIAGNOSIS — M1611 Unilateral primary osteoarthritis, right hip: Secondary | ICD-10-CM | POA: Diagnosis not present

## 2018-04-13 DIAGNOSIS — E220 Acromegaly and pituitary gigantism: Secondary | ICD-10-CM | POA: Diagnosis not present

## 2018-04-14 DIAGNOSIS — M1611 Unilateral primary osteoarthritis, right hip: Secondary | ICD-10-CM | POA: Diagnosis not present

## 2018-04-14 DIAGNOSIS — M6281 Muscle weakness (generalized): Secondary | ICD-10-CM | POA: Diagnosis not present

## 2018-04-14 DIAGNOSIS — M25551 Pain in right hip: Secondary | ICD-10-CM | POA: Diagnosis not present

## 2018-04-14 DIAGNOSIS — R262 Difficulty in walking, not elsewhere classified: Secondary | ICD-10-CM | POA: Diagnosis not present

## 2018-04-16 DIAGNOSIS — M6281 Muscle weakness (generalized): Secondary | ICD-10-CM | POA: Diagnosis not present

## 2018-04-16 DIAGNOSIS — M1611 Unilateral primary osteoarthritis, right hip: Secondary | ICD-10-CM | POA: Diagnosis not present

## 2018-04-16 DIAGNOSIS — R262 Difficulty in walking, not elsewhere classified: Secondary | ICD-10-CM | POA: Diagnosis not present

## 2018-04-16 DIAGNOSIS — M25551 Pain in right hip: Secondary | ICD-10-CM | POA: Diagnosis not present

## 2018-04-20 DIAGNOSIS — M25662 Stiffness of left knee, not elsewhere classified: Secondary | ICD-10-CM | POA: Diagnosis not present

## 2018-04-20 DIAGNOSIS — M25562 Pain in left knee: Secondary | ICD-10-CM | POA: Diagnosis not present

## 2018-04-20 DIAGNOSIS — R262 Difficulty in walking, not elsewhere classified: Secondary | ICD-10-CM | POA: Diagnosis not present

## 2018-04-20 DIAGNOSIS — M6281 Muscle weakness (generalized): Secondary | ICD-10-CM | POA: Diagnosis not present

## 2018-04-22 DIAGNOSIS — M6281 Muscle weakness (generalized): Secondary | ICD-10-CM | POA: Diagnosis not present

## 2018-04-22 DIAGNOSIS — M25551 Pain in right hip: Secondary | ICD-10-CM | POA: Diagnosis not present

## 2018-04-22 DIAGNOSIS — R262 Difficulty in walking, not elsewhere classified: Secondary | ICD-10-CM | POA: Diagnosis not present

## 2018-04-22 DIAGNOSIS — M1611 Unilateral primary osteoarthritis, right hip: Secondary | ICD-10-CM | POA: Diagnosis not present

## 2018-04-23 ENCOUNTER — Telehealth: Payer: Self-pay | Admitting: *Deleted

## 2018-04-23 ENCOUNTER — Telehealth: Payer: Self-pay | Admitting: Family Medicine

## 2018-04-23 ENCOUNTER — Ambulatory Visit: Payer: BLUE CROSS/BLUE SHIELD

## 2018-04-23 NOTE — Telephone Encounter (Signed)
Called pt to see if he can be here by 1:00 . His preferred nurse will not be available after 1:00 to give him Injection FR

## 2018-04-26 ENCOUNTER — Other Ambulatory Visit: Payer: Self-pay

## 2018-04-26 ENCOUNTER — Ambulatory Visit (INDEPENDENT_AMBULATORY_CARE_PROVIDER_SITE_OTHER): Payer: BLUE CROSS/BLUE SHIELD | Admitting: Family Medicine

## 2018-04-26 DIAGNOSIS — E291 Testicular hypofunction: Secondary | ICD-10-CM

## 2018-04-26 MED ORDER — TESTOSTERONE CYPIONATE 200 MG/ML IM SOLN
100.0000 mg | Freq: Once | INTRAMUSCULAR | Status: AC
  Administered 2018-04-26: 100 mg via INTRAMUSCULAR

## 2018-04-27 DIAGNOSIS — R262 Difficulty in walking, not elsewhere classified: Secondary | ICD-10-CM | POA: Diagnosis not present

## 2018-04-27 DIAGNOSIS — M6281 Muscle weakness (generalized): Secondary | ICD-10-CM | POA: Diagnosis not present

## 2018-04-27 DIAGNOSIS — M25551 Pain in right hip: Secondary | ICD-10-CM | POA: Diagnosis not present

## 2018-04-27 DIAGNOSIS — M1611 Unilateral primary osteoarthritis, right hip: Secondary | ICD-10-CM | POA: Diagnosis not present

## 2018-04-27 NOTE — Progress Notes (Signed)
Nurse Visit for injection.  Did not see provider.

## 2018-04-28 NOTE — Progress Notes (Signed)
Patient here for testosterone cypionate 0.5 mg injection only. Injection to the right upper outer quadrant, patient tolerated injection well.

## 2018-04-28 NOTE — Telephone Encounter (Signed)
Patient was given testosterone cypionate 0.5 mg to the right upper outer quadrant. Patient supplied the medication.

## 2018-04-29 DIAGNOSIS — M1611 Unilateral primary osteoarthritis, right hip: Secondary | ICD-10-CM | POA: Diagnosis not present

## 2018-04-29 DIAGNOSIS — R262 Difficulty in walking, not elsewhere classified: Secondary | ICD-10-CM | POA: Diagnosis not present

## 2018-04-29 DIAGNOSIS — M6281 Muscle weakness (generalized): Secondary | ICD-10-CM | POA: Diagnosis not present

## 2018-04-29 DIAGNOSIS — M25551 Pain in right hip: Secondary | ICD-10-CM | POA: Diagnosis not present

## 2018-05-03 DIAGNOSIS — M25551 Pain in right hip: Secondary | ICD-10-CM | POA: Diagnosis not present

## 2018-05-03 DIAGNOSIS — R262 Difficulty in walking, not elsewhere classified: Secondary | ICD-10-CM | POA: Diagnosis not present

## 2018-05-03 DIAGNOSIS — M6281 Muscle weakness (generalized): Secondary | ICD-10-CM | POA: Diagnosis not present

## 2018-05-03 DIAGNOSIS — M1611 Unilateral primary osteoarthritis, right hip: Secondary | ICD-10-CM | POA: Diagnosis not present

## 2018-05-05 DIAGNOSIS — Z96641 Presence of right artificial hip joint: Secondary | ICD-10-CM | POA: Diagnosis not present

## 2018-05-05 DIAGNOSIS — M6281 Muscle weakness (generalized): Secondary | ICD-10-CM | POA: Diagnosis not present

## 2018-05-05 DIAGNOSIS — R262 Difficulty in walking, not elsewhere classified: Secondary | ICD-10-CM | POA: Diagnosis not present

## 2018-05-05 DIAGNOSIS — M25551 Pain in right hip: Secondary | ICD-10-CM | POA: Diagnosis not present

## 2018-05-10 ENCOUNTER — Encounter: Payer: Self-pay | Admitting: Family Medicine

## 2018-05-10 ENCOUNTER — Ambulatory Visit: Payer: BLUE CROSS/BLUE SHIELD | Admitting: Family Medicine

## 2018-05-10 ENCOUNTER — Other Ambulatory Visit: Payer: Self-pay

## 2018-05-10 VITALS — BP 111/61 | HR 47 | Temp 98.5°F | Resp 14 | Ht 78.0 in | Wt 234.8 lb

## 2018-05-10 DIAGNOSIS — R600 Localized edema: Secondary | ICD-10-CM

## 2018-05-10 DIAGNOSIS — R609 Edema, unspecified: Secondary | ICD-10-CM

## 2018-05-10 DIAGNOSIS — I4892 Unspecified atrial flutter: Secondary | ICD-10-CM | POA: Diagnosis not present

## 2018-05-10 DIAGNOSIS — IMO0002 Reserved for concepts with insufficient information to code with codable children: Secondary | ICD-10-CM

## 2018-05-10 DIAGNOSIS — E230 Hypopituitarism: Secondary | ICD-10-CM

## 2018-05-10 DIAGNOSIS — R17 Unspecified jaundice: Secondary | ICD-10-CM

## 2018-05-10 DIAGNOSIS — I4891 Unspecified atrial fibrillation: Secondary | ICD-10-CM

## 2018-05-10 DIAGNOSIS — D649 Anemia, unspecified: Secondary | ICD-10-CM

## 2018-05-10 DIAGNOSIS — E871 Hypo-osmolality and hyponatremia: Secondary | ICD-10-CM | POA: Diagnosis not present

## 2018-05-10 NOTE — Patient Instructions (Addendum)
  I will check the anemia test, sodium level and other electrolytes as we discussed.  Blood to hear that you are doing better and the swelling has improved.  Continue follow-up with cardiology as planned.  I will see if they can review your EKG from today.  If any new heart palpitations, lightheadedness, dizziness or return of swelling, I recommend being seen right away.  Thyroid test is likely going to be rechecked next month with your endocrinologist.  No change in other medicines at this time.  Plan a follow-up visit in 6 weeks, potentially may be a telemedicine visit at that time depending on COVID-19.  Return to the clinic or go to the nearest emergency room if any of your symptoms worsen or new symptoms occur.    If you have lab work done today you will be contacted with your lab results within the next 2 weeks.  If you have not heard from [place] then please contact us. The fastest way to get your results is to register for My Chart.   IF you received an x-ray today, you will receive an invoice from [facility]. Please contact [facility] at [phone] with questions or concerns regarding your invoice.   IF you received labwork today, you will receive an invoice from [facility]. Please contact [facility] at [phone] with questions or concerns regarding your invoice.   Our billing staff will not be able to assist you with questions regarding bills from these companies.  You will be contacted with the lab results as soon as they are available. The fastest way to get your results is to activate your My Chart account. Instructions are located on the last page of this paperwork. If you have not heard from [place] regarding the results in 2 weeks, please contact this office.

## 2018-05-10 NOTE — Progress Notes (Signed)
Subjective:    Patient ID: Mark Gallagher, male    DOB: 1970-04-29, 48 y.o.   MRN: [id number]  HPI Mark Gallagher is a 48 y.o. male Presents today for: Chief Complaint  Patient presents with  . Post-op Follow-up    right hip- Had a hip replacement 8 weeks ago- hip still sore.   Last seen March 6, at that time was hospital follow-up from right total hip surgery on February 25.  Ultimately required stress dose steroids, pressors for hypotension and transfusion for blood loss.  At his follow-up visit he had significant edema, anasarca, and jaundice.  weight had increased 9 pounds in the matter 3 days, and was up 43 pounds from February 18.  Sent to ER for evaluation, admitted that day through March 12.   Has been feeling better. Still some pain in R hip. Has been walking with 4 pronged cane in house. S/p 8 session PT. Walking outside last Friday. Ortho - Dr. Fredonia Highland.   Anasarca: Thought to be due to prolonged stress steroids and perioperative volume resuscitation.  No evidence of CHF on echo no DVT on previous ultrasound.  Treated with Lasix in hospital and discharged on p.o. Lasix.  Was down to home dose of Cortef at 10 mg p.o. daily as of March 10.   Off lasix since few days after leaving hospital. No recurrence of swelling. Has been doing foot pumps to help with swelling.  Leg swelling on side of hip surgery.  No new chest pains. Min shortness of breath has improved.  Wt Readings from Last 3 Encounters:  05/10/18 234 lb 12.8 oz (106.5 kg)  04/01/18 242 lb 1.6 oz (109.8 kg)  03/26/18 285 lb (129.3 kg)    Left renal vascular malformation/fistula.  Planned outpatient monitoring and was seen by urology.  Asymptomatic.  If symptomatic, was recommended nephrectomy by vascular surgery.  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  Of note his TSH was low, possibly related to sick thyroid  syndrome.  There was no clinical evidence of overt hypo-or hyperthyroidism and plan for outpatient follow-up with endocrinology.  He was also continued on octreotide 80 mg every 28 days., cortef 10mg  BID.  Has labs planned in mid May with endocrine.   Hyponatremia: -was noted without hyperkalemia or metabolic acidosis.   Hypokalemia also replaced as needed with diuresis. Last sodium 129 on March 12, potassium 3.9 on March 12. On longer on potassium as off lasix.   Hyperbilirubinemia: Cholestatic pattern with normal AST, ALT.  There were no hepatobiliary ductal dilatation or parenchymal abnormalities on CT.  Fractionated bilirubin with a mixed picture but was overall improving.  Thought to be possible congestive hepatopathy. Most recent bilirubin 1.8 on March 12.  Cardiac,Aortic root dilatation: 5.1 cm on March 7 echo.  Compared to report at [facility] in October 2018 5.1, 2017 5.5 cm.  Plan for continued monitoring outpatient. Has appt with cardiology on July 20th with repeat CT at [facility]. on metoprolol - denies new lightheadedness. Hx of WPW, atrial fib/flutter. Seen by cardiology March 18th.  Off losartan, takes metoprolol 12.5mg  (1/2 of 25mg ). Plan for holter monitor. Occasional ectopy noted that visit.   Anemia: Lab Results  Component Value Date   WBC 4.9 03/31/2018   HGB 8.9 (L) 03/31/2018   HCT 29.4 (L) 03/31/2018   MCV 103.9 (H) 03/31/2018   PLT 321 03/31/2018  Hemoglobin 13.1 on February 18 preoperatively.  Has required transfusion as above.  Most recent CBC has above with hemoglobin 8.9 on March 11.  Energy level improving.  Over 10 min chart review   Patient Active Problem List   Diagnosis Date Noted  . Chronic adrenal insufficiency ([place]) 03/26/2018  . Hyponatremia 03/26/2018  . Anasarca 03/26/2018  . Hyperbilirubinemia 03/26/2018  . 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 Primary localized osteoarthritis of hip 03/16/2018  . S/P total hip arthroplasty 03/16/2018  . Hypothyroidism  02/22/2018  . History of fusion of cervical spine 02/22/2018  . Iron deficiency anemia 02/22/2018  . Acquired left foot drop 02/22/2018  . Primary osteoarthritis of right hip 02/22/2018  . AVNRT (AV nodal re-entry tachycardia) ([place]) 02/22/2018  . Constipation 12/15/2016  . Traumatic closed fracture of distal clavicle with minimal displacement, left, with routine healing, subsequent encounter 03/24/2016  . Left knee pain 09/25/2015  . Adjustment disorder with depressed mood   . Tetraplegia ([place]) 09/19/2014  . Acute blood loss anemia 09/19/2014  . Cervical spondylosis with myelopathy 09/18/2014  . Radiculopathy of arm 08/28/2014  . Hypogonadism male 09/08/2013  . 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 Cardiomyopathy 04/01/2011  . Wolff-Parkinson-White (WPW) syndrome 04/01/2011   Past Medical History:  Diagnosis Date  . 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 Arthritis   . Cancer ([place])   . History of pituitary tumor   . Hypertension   . Hypothyroidism   . Knee pain, bilateral   . Pneumonia   . Testosterone deficiency   . Thyroid disease    Past Surgical History:  Procedure Laterality Date  . CARDIAC ELECTROPHYSIOLOGY STUDY AND ABLATION    . CERVICAL DISC SURGERY  2007   Anterior C4/5 vertebrectomy and c3-C6 laminoplasty with plating  . CRANIOTOMY FOR TUMOR     pituitary adenoma  . TOTAL HIP ARTHROPLASTY Right 03/16/2018   Procedure: TOTAL HIP ARTHROPLASTY Anterior;  Surgeon: Renette Butters, MD;  Location: [facility];  Service: Orthopedics;  Laterality: Right;   Allergies  Allergen Reactions  . Oxycodone Nausea Only   Prior to Admission medications   Medication Sig Start Date End Date Taking? Authorizing Provider  atorvastatin (LIPITOR) 40 MG tablet Take 40 mg by mouth daily.   Yes [provider]  Cholecalciferol (VITAMIN D3) 25 MCG (1000 UT) CAPS Take 2,000 Units by mouth daily.    Yes [provider]  docusate sodium (COLACE) 250 MG capsule Take 250 mg by mouth daily.   Yes  [provider]  eplerenone (INSPRA) 50 MG tablet Take 50 mg by mouth daily.   Yes [provider]  ferrous sulfate 325 (65 FE) MG tablet Take 1 tablet (325 mg total) by mouth 2 (two) times daily with a meal. 10/05/14  Yes Love, Ivan Anchors, PA-C  hydrocortisone (CORTEF) 10 MG tablet Take 1 tablet daily.  You may take 2 tablets daily with excessive weakness and tiredness and lethargy. Patient taking differently: Take 10 mg by mouth daily.  03/23/18  Yes Georgette Shell, MD  levothyroxine (SYNTHROID, LEVOTHROID) 200 MCG tablet Take 1 tablet (200 mcg total) by mouth daily. 10/05/14  Yes Love, Ivan Anchors, PA-C  Multiple Vitamins-Minerals (CENTRUM SILVER PO) Take 1 tablet by mouth daily.   Yes [provider]  octreotide (SANDOSTATIN LAR) 30 MG injection Inject 30 mg into the muscle every 28 (twenty-eight) days.    Yes [provider]  polyethylene glycol (MIRALAX / GLYCOLAX) packet Take 17 g by mouth daily as needed for mild constipation.   Yes [provider]  testosterone cypionate (DEPOTESTOSTERONE CYPIONATE) 200 MG/ML injection Inject 1 mL (200 mg total) into the muscle every 14 (fourteen) days. 12/22/15  Yes Wardell Honour, MD  naproxen sodium (ALEVE) 220 MG tablet Take 220-440 mg by mouth daily as needed (for pain or headache).    [provider]   Social History   Socioeconomic History  . Marital status: Single    Spouse name: Not on file  . Number of children: Not on file  . Years of education: Not on file  . Highest education level: Not on file  Occupational History  . Not on file  Social Needs  . Financial resource strain: Not on file  . Food insecurity:    Worry: Not on file    Inability: Not on file  . Transportation needs:    Medical: Not on file    Non-medical: Not on file  Tobacco Use  . Smoking status: Never Smoker  . Smokeless tobacco: Never Used  Substance and Sexual Activity  . Alcohol use: No  . Drug use: No  .  Sexual activity: Not on file  Lifestyle  . Physical activity:    Days per week: Not on file    Minutes per session: Not on file  . Stress: Not on file  Relationships  . Social connections:    Talks on phone: Not on file    Gets together: Not on file    Attends religious service: Not on file    Active member of club or organization: Not on file    Attends meetings of clubs or organizations: Not on file    Relationship status: Not on file  . Intimate partner violence:    Fear of current or ex partner: Not on file    Emotionally abused: Not on file    Physically abused: Not on file    Forced sexual activity: Not on file  Other Topics Concern  . Not on file  Social History Narrative  . Not on file    Review of Systems  Constitutional: Negative for fever and unexpected weight change.  Respiratory: Negative for shortness of breath (decreased).   Cardiovascular: Negative for chest pain and palpitations.  Gastrointestinal: Negative for blood in stool and constipation.   other per HPI.     Objective:   Physical Exam Vitals signs reviewed.  Constitutional:      Appearance: He is well-developed.  HENT:     Head: Normocephalic and atraumatic.  Eyes:     Pupils: Pupils are equal, round, and reactive to light.  Neck:     Vascular: No carotid bruit or JVD.  Cardiovascular:     Rate and Rhythm: Normal rate. Rhythm irregular.     Heart sounds: Normal heart sounds.     Comments: Few ectopic beats, irregular. ? Faint gallop.  Pulmonary:     Effort: Pulmonary effort is normal.     Breath sounds: Normal breath sounds. No rales.  Skin:    General: Skin is warm and dry.  Neurological:     Mental Status: He is alert and oriented to person, place, and time.    Vitals:   05/10/18 1027  BP: 111/61  Pulse: (!) 47  Resp: 14  Temp: 98.5 F (36.9 C)  TempSrc: Oral  SpO2: 98%  Weight: 234 lb 12.8 oz (106.5 kg)  Height: 6\' 6"  (1.981 m)   Wt Readings from Last 3 Encounters:   05/10/18 234 lb 12.8 oz (106.5 kg)  04/01/18 242 lb 1.6 oz (109.8 kg)  03/26/18 285 lb (129.3 kg)   Results for orders placed or performed in visit on 05/10/18  Comprehensive metabolic panel  Result Value Ref Range   Glucose 93 65 - 99 mg/dL   BUN 14 6 - 24 mg/dL   Creatinine, Ser 0.66 (L) 0.76 - 1.27 mg/dL   GFR calc non Af Amer 114 >59 mL/min/1.73   GFR calc Af Amer 132 >59 mL/min/1.73   BUN/Creatinine Ratio 21 (H) 9 - 20   Sodium 137 134 - 144 mmol/L   Potassium 4.3 3.5 - 5.2 mmol/L   Chloride 95 (L) 96 - 106 mmol/L   CO2 21 20 - 29 mmol/L   Calcium 9.6 8.7 - 10.2 mg/dL   Total Protein 7.0 6.0 - 8.5 g/dL   Albumin 4.6 4.0 - 5.0 g/dL   Globulin, Total 2.4 1.5 - 4.5 g/dL   Albumin/Globulin Ratio 1.9 1.2 - 2.2   Bilirubin Total 0.6 0.0 - 1.2 mg/dL   Alkaline Phosphatase 115 39 - 117 IU/L   AST 7 0 - 40 IU/L   ALT 8 0 - 44 IU/L  CBC  Result Value Ref Range   WBC 5.7 3.4 - 10.8 x10E3/uL   RBC 4.30 4.14 - 5.80 x10E6/uL   Hemoglobin 13.4 13.0 - 17.7 g/dL   Hematocrit 40.5 37.5 - 51.0 %   MCV 94 79 - 97 fL   MCH 31.2 26.6 - 33.0 pg   MCHC 33.1 31.5 - 35.7 g/dL   RDW 13.1 11.6 - 15.4 %   Platelets 232 150 - 450 x10E3/uL       Assessment & Plan:  Mark Gallagher is a 48 y.o. male Peripheral edema - Plan: Comprehensive metabolic panel  -Significant improvement, no appreciable edema at this time.  Repeat lytes obtained = stable.   Hyponatremia - Plan: Comprehensive metabolic panel  -Resolved on repeat testing.  Elevated bilirubin - Plan: Comprehensive metabolic panel  -Normal on repeat testing.  Anemia, unspecified type - Plan: CBC  -Now stabilized on repeat testing.  Atrial fibrillation/flutter ([place]) - Plan: EKG 12-Lead  -Few ectopic beats throughout office visit, hemodynamically stable.  Has ongoing follow-up with cardiology.  Did send message/phone call to review most recent EKG with cardiology.  Panhypopituitarism ([place])  -Followed by endocrinology, tolerating  current dose of hydrocortisone.  Prior abnormal TSH, likely sick thyroid syndrome, plan for repeat testing with endocrinology.  Postoperative pain improving, has ongoing follow-up with orthopedics.  No orders of the defined types were placed in this encounter.  Patient Instructions    I will check the anemia test, sodium level and other electrolytes as we discussed.  Blood to hear that you are doing better and the swelling has improved.  Continue follow-up with cardiology as planned.  I will see if they can review your EKG from today.  If any new heart palpitations, lightheadedness, dizziness or return of swelling, I recommend being seen right away.  Thyroid test is likely going to be rechecked next month with your endocrinologist.  No change in other medicines at this time.  Plan a follow-up visit in 6 weeks, potentially may be a telemedicine visit at that time depending on COVID-19.  Return to the clinic or go to the nearest emergency room if any of your symptoms worsen or new symptoms occur.    If you have lab work done today you will be contacted with your lab results within the next 2 weeks.  If you  have not heard from [place] then please contact us. The fastest way to get your results is to register for My Chart.   IF you received an x-ray today, you will receive an invoice from [facility]. Please contact [facility] at [phone] with questions or concerns regarding your invoice.   IF you received labwork today, you will receive an invoice from [facility]. Please contact [facility] at [phone] with questions or concerns regarding your invoice.   Our billing staff will not be able to assist you with questions regarding bills from these companies.  You will be contacted with the lab results as soon as they are available. The fastest way to get your results is to activate your My Chart account. Instructions are located on the last page of this paperwork. If you have  not heard from [place] regarding the results in 2 weeks, please contact this office.       Signed,   [name], MD [facility].  05/13/18 11:41 AM

## 2018-05-11 LAB — CBC
Hematocrit: 40.5 % (ref 37.5–51.0)
Hemoglobin: 13.4 g/dL (ref 13.0–17.7)
MCH: 31.2 pg (ref 26.6–33.0)
MCHC: 33.1 g/dL (ref 31.5–35.7)
MCV: 94 fL (ref 79–97)
Platelets: 232 10*3/uL (ref 150–450)
RBC: 4.3 x10E6/uL (ref 4.14–5.80)
RDW: 13.1 % (ref 11.6–15.4)
WBC: 5.7 10*3/uL (ref 3.4–10.8)

## 2018-05-11 LAB — COMPREHENSIVE METABOLIC PANEL
ALT: 8 IU/L (ref 0–44)
AST: 7 IU/L (ref 0–40)
Albumin/Globulin Ratio: 1.9 (ref 1.2–2.2)
Albumin: 4.6 g/dL (ref 4.0–5.0)
Alkaline Phosphatase: 115 IU/L (ref 39–117)
BUN/Creatinine Ratio: 21 — ABNORMAL HIGH (ref 9–20)
BUN: 14 mg/dL (ref 6–24)
Bilirubin Total: 0.6 mg/dL (ref 0.0–1.2)
CO2: 21 mmol/L (ref 20–29)
Calcium: 9.6 mg/dL (ref 8.7–10.2)
Chloride: 95 mmol/L — ABNORMAL LOW (ref 96–106)
Creatinine, Ser: 0.66 mg/dL — ABNORMAL LOW (ref 0.76–1.27)
GFR calc Af Amer: 132 mL/min/{1.73_m2} (ref >59)
GFR calc non Af Amer: 114 mL/min/{1.73_m2} (ref >59)
Globulin, Total: 2.4 g/dL (ref 1.5–4.5)
Glucose: 93 mg/dL (ref 65–99)
Potassium: 4.3 mmol/L (ref 3.5–5.2)
Sodium: 137 mmol/L (ref 134–144)
Total Protein: 7 g/dL (ref 6.0–8.5)

## 2018-05-12 ENCOUNTER — Ambulatory Visit (INDEPENDENT_AMBULATORY_CARE_PROVIDER_SITE_OTHER): Payer: BLUE CROSS/BLUE SHIELD | Admitting: Family Medicine

## 2018-05-12 ENCOUNTER — Other Ambulatory Visit: Payer: Self-pay

## 2018-05-12 DIAGNOSIS — E220 Acromegaly and pituitary gigantism: Secondary | ICD-10-CM | POA: Diagnosis not present

## 2018-05-12 DIAGNOSIS — E291 Testicular hypofunction: Secondary | ICD-10-CM

## 2018-05-12 MED ORDER — TESTOSTERONE CYPIONATE 200 MG/ML IM SOLN
100.0000 mg | Freq: Once | INTRAMUSCULAR | Status: AC
  Administered 2018-05-12: 100 mg via INTRAMUSCULAR

## 2018-05-12 MED ORDER — OCTREOTIDE ACETATE 30 MG IM KIT
30.0000 mg | PACK | Freq: Once | INTRAMUSCULAR | Status: AC
  Administered 2018-05-12: 30 mg via INTRAMUSCULAR

## 2018-05-12 NOTE — Progress Notes (Signed)
Patient was here for injections for Sandostatin LAR 30 mg injected in the left upper outer quadrant and Testosterone Cypionate 0.5 ml to the right upper outer quadrant. Patient felt a uncomfortable with the Sandostatin injection, he had a little pain. Patient tolerated the Testosterone injection.

## 2018-05-13 ENCOUNTER — Encounter: Payer: Self-pay | Admitting: Family Medicine

## 2018-05-13 DIAGNOSIS — M6281 Muscle weakness (generalized): Secondary | ICD-10-CM | POA: Diagnosis not present

## 2018-05-13 DIAGNOSIS — M25551 Pain in right hip: Secondary | ICD-10-CM | POA: Diagnosis not present

## 2018-05-13 DIAGNOSIS — Z96641 Presence of right artificial hip joint: Secondary | ICD-10-CM | POA: Diagnosis not present

## 2018-05-13 DIAGNOSIS — R262 Difficulty in walking, not elsewhere classified: Secondary | ICD-10-CM | POA: Diagnosis not present

## 2018-05-17 DIAGNOSIS — M6281 Muscle weakness (generalized): Secondary | ICD-10-CM | POA: Diagnosis not present

## 2018-05-17 DIAGNOSIS — M25551 Pain in right hip: Secondary | ICD-10-CM | POA: Diagnosis not present

## 2018-05-17 DIAGNOSIS — R262 Difficulty in walking, not elsewhere classified: Secondary | ICD-10-CM | POA: Diagnosis not present

## 2018-05-17 DIAGNOSIS — Z96641 Presence of right artificial hip joint: Secondary | ICD-10-CM | POA: Diagnosis not present

## 2018-05-19 DIAGNOSIS — R262 Difficulty in walking, not elsewhere classified: Secondary | ICD-10-CM | POA: Diagnosis not present

## 2018-05-19 DIAGNOSIS — M25551 Pain in right hip: Secondary | ICD-10-CM | POA: Diagnosis not present

## 2018-05-19 DIAGNOSIS — M6281 Muscle weakness (generalized): Secondary | ICD-10-CM | POA: Diagnosis not present

## 2018-05-19 DIAGNOSIS — Z96641 Presence of right artificial hip joint: Secondary | ICD-10-CM | POA: Diagnosis not present

## 2018-05-24 DIAGNOSIS — M6281 Muscle weakness (generalized): Secondary | ICD-10-CM | POA: Diagnosis not present

## 2018-05-24 DIAGNOSIS — R262 Difficulty in walking, not elsewhere classified: Secondary | ICD-10-CM | POA: Diagnosis not present

## 2018-05-24 DIAGNOSIS — M25551 Pain in right hip: Secondary | ICD-10-CM | POA: Diagnosis not present

## 2018-05-24 DIAGNOSIS — Z96641 Presence of right artificial hip joint: Secondary | ICD-10-CM | POA: Diagnosis not present

## 2018-05-26 ENCOUNTER — Ambulatory Visit (INDEPENDENT_AMBULATORY_CARE_PROVIDER_SITE_OTHER): Payer: BLUE CROSS/BLUE SHIELD | Admitting: Family Medicine

## 2018-05-26 ENCOUNTER — Other Ambulatory Visit: Payer: Self-pay

## 2018-05-26 ENCOUNTER — Telehealth: Payer: Self-pay | Admitting: Family Medicine

## 2018-05-26 DIAGNOSIS — M6281 Muscle weakness (generalized): Secondary | ICD-10-CM | POA: Diagnosis not present

## 2018-05-26 DIAGNOSIS — Z96641 Presence of right artificial hip joint: Secondary | ICD-10-CM | POA: Diagnosis not present

## 2018-05-26 DIAGNOSIS — E291 Testicular hypofunction: Secondary | ICD-10-CM | POA: Diagnosis not present

## 2018-05-26 DIAGNOSIS — R262 Difficulty in walking, not elsewhere classified: Secondary | ICD-10-CM | POA: Diagnosis not present

## 2018-05-26 DIAGNOSIS — M25551 Pain in right hip: Secondary | ICD-10-CM | POA: Diagnosis not present

## 2018-05-27 MED ORDER — TESTOSTERONE CYPIONATE 200 MG/ML IM SOLN
100.0000 mg | Freq: Once | INTRAMUSCULAR | Status: AC
  Administered 2018-05-26: 100 mg via INTRAMUSCULAR

## 2018-05-31 DIAGNOSIS — M6281 Muscle weakness (generalized): Secondary | ICD-10-CM | POA: Diagnosis not present

## 2018-05-31 DIAGNOSIS — Z96641 Presence of right artificial hip joint: Secondary | ICD-10-CM | POA: Diagnosis not present

## 2018-05-31 DIAGNOSIS — M25551 Pain in right hip: Secondary | ICD-10-CM | POA: Diagnosis not present

## 2018-05-31 DIAGNOSIS — R262 Difficulty in walking, not elsewhere classified: Secondary | ICD-10-CM | POA: Diagnosis not present

## 2018-06-02 DIAGNOSIS — Z96641 Presence of right artificial hip joint: Secondary | ICD-10-CM | POA: Diagnosis not present

## 2018-06-02 DIAGNOSIS — R262 Difficulty in walking, not elsewhere classified: Secondary | ICD-10-CM | POA: Diagnosis not present

## 2018-06-02 DIAGNOSIS — M6281 Muscle weakness (generalized): Secondary | ICD-10-CM | POA: Diagnosis not present

## 2018-06-02 DIAGNOSIS — M25551 Pain in right hip: Secondary | ICD-10-CM | POA: Diagnosis not present

## 2018-06-07 DIAGNOSIS — M6281 Muscle weakness (generalized): Secondary | ICD-10-CM | POA: Diagnosis not present

## 2018-06-07 DIAGNOSIS — Z96641 Presence of right artificial hip joint: Secondary | ICD-10-CM | POA: Diagnosis not present

## 2018-06-07 DIAGNOSIS — R262 Difficulty in walking, not elsewhere classified: Secondary | ICD-10-CM | POA: Diagnosis not present

## 2018-06-07 DIAGNOSIS — M25551 Pain in right hip: Secondary | ICD-10-CM | POA: Diagnosis not present

## 2018-06-09 ENCOUNTER — Ambulatory Visit (INDEPENDENT_AMBULATORY_CARE_PROVIDER_SITE_OTHER): Payer: BLUE CROSS/BLUE SHIELD | Admitting: Family Medicine

## 2018-06-09 ENCOUNTER — Other Ambulatory Visit: Payer: Self-pay

## 2018-06-09 DIAGNOSIS — E291 Testicular hypofunction: Secondary | ICD-10-CM | POA: Diagnosis not present

## 2018-06-09 DIAGNOSIS — Z96641 Presence of right artificial hip joint: Secondary | ICD-10-CM | POA: Diagnosis not present

## 2018-06-09 DIAGNOSIS — E220 Acromegaly and pituitary gigantism: Secondary | ICD-10-CM | POA: Diagnosis not present

## 2018-06-09 DIAGNOSIS — M25551 Pain in right hip: Secondary | ICD-10-CM | POA: Diagnosis not present

## 2018-06-09 DIAGNOSIS — M6281 Muscle weakness (generalized): Secondary | ICD-10-CM | POA: Diagnosis not present

## 2018-06-09 DIAGNOSIS — R262 Difficulty in walking, not elsewhere classified: Secondary | ICD-10-CM | POA: Diagnosis not present

## 2018-06-09 MED ORDER — OCTREOTIDE ACETATE 30 MG IM KIT
30.0000 mg | PACK | Freq: Once | INTRAMUSCULAR | Status: AC
  Administered 2018-06-09: 30 mg via INTRAMUSCULAR

## 2018-06-09 MED ORDER — TESTOSTERONE CYPIONATE 200 MG/ML IM SOLN
100.0000 mg | Freq: Once | INTRAMUSCULAR | Status: AC
  Administered 2018-06-09: 100 mg via INTRAMUSCULAR

## 2018-06-11 DIAGNOSIS — E890 Postprocedural hypothyroidism: Secondary | ICD-10-CM | POA: Diagnosis not present

## 2018-06-11 DIAGNOSIS — E220 Acromegaly and pituitary gigantism: Secondary | ICD-10-CM | POA: Diagnosis not present

## 2018-06-11 DIAGNOSIS — D497 Neoplasm of unspecified behavior of endocrine glands and other parts of nervous system: Secondary | ICD-10-CM | POA: Diagnosis not present

## 2018-06-11 DIAGNOSIS — E2740 Unspecified adrenocortical insufficiency: Secondary | ICD-10-CM | POA: Diagnosis not present

## 2018-06-11 DIAGNOSIS — E349 Endocrine disorder, unspecified: Secondary | ICD-10-CM | POA: Diagnosis not present

## 2018-06-16 DIAGNOSIS — M25551 Pain in right hip: Secondary | ICD-10-CM | POA: Diagnosis not present

## 2018-06-16 DIAGNOSIS — Z96641 Presence of right artificial hip joint: Secondary | ICD-10-CM | POA: Diagnosis not present

## 2018-06-16 DIAGNOSIS — M6281 Muscle weakness (generalized): Secondary | ICD-10-CM | POA: Diagnosis not present

## 2018-06-16 DIAGNOSIS — R262 Difficulty in walking, not elsewhere classified: Secondary | ICD-10-CM | POA: Diagnosis not present

## 2018-06-21 DIAGNOSIS — M25551 Pain in right hip: Secondary | ICD-10-CM | POA: Diagnosis not present

## 2018-06-21 DIAGNOSIS — Z96641 Presence of right artificial hip joint: Secondary | ICD-10-CM | POA: Diagnosis not present

## 2018-06-21 DIAGNOSIS — R262 Difficulty in walking, not elsewhere classified: Secondary | ICD-10-CM | POA: Diagnosis not present

## 2018-06-21 DIAGNOSIS — M6281 Muscle weakness (generalized): Secondary | ICD-10-CM | POA: Diagnosis not present

## 2018-06-23 ENCOUNTER — Other Ambulatory Visit: Payer: Self-pay

## 2018-06-23 ENCOUNTER — Ambulatory Visit (INDEPENDENT_AMBULATORY_CARE_PROVIDER_SITE_OTHER): Payer: BC Managed Care – PPO | Admitting: Family Medicine

## 2018-06-23 VITALS — HR 93 | Temp 97.5°F

## 2018-06-23 DIAGNOSIS — E291 Testicular hypofunction: Secondary | ICD-10-CM

## 2018-06-23 MED ORDER — TESTOSTERONE CYPIONATE 200 MG/ML IM SOLN
100.0000 mg | Freq: Once | INTRAMUSCULAR | Status: AC
  Administered 2018-06-23: 100 mg via INTRAMUSCULAR

## 2018-06-28 DIAGNOSIS — M25551 Pain in right hip: Secondary | ICD-10-CM | POA: Diagnosis not present

## 2018-06-28 DIAGNOSIS — Z96641 Presence of right artificial hip joint: Secondary | ICD-10-CM | POA: Diagnosis not present

## 2018-06-28 DIAGNOSIS — M6281 Muscle weakness (generalized): Secondary | ICD-10-CM | POA: Diagnosis not present

## 2018-06-28 DIAGNOSIS — R262 Difficulty in walking, not elsewhere classified: Secondary | ICD-10-CM | POA: Diagnosis not present

## 2018-06-29 NOTE — Telephone Encounter (Signed)
Has been in

## 2018-07-07 ENCOUNTER — Other Ambulatory Visit: Payer: Self-pay

## 2018-07-07 ENCOUNTER — Ambulatory Visit: Payer: BC Managed Care – PPO

## 2018-07-09 ENCOUNTER — Ambulatory Visit: Payer: BC Managed Care – PPO

## 2018-07-12 ENCOUNTER — Ambulatory Visit (INDEPENDENT_AMBULATORY_CARE_PROVIDER_SITE_OTHER): Payer: BC Managed Care – PPO | Admitting: Family Medicine

## 2018-07-12 ENCOUNTER — Other Ambulatory Visit: Payer: Self-pay

## 2018-07-12 DIAGNOSIS — E291 Testicular hypofunction: Secondary | ICD-10-CM | POA: Diagnosis not present

## 2018-07-12 DIAGNOSIS — E2740 Unspecified adrenocortical insufficiency: Secondary | ICD-10-CM

## 2018-07-12 DIAGNOSIS — E220 Acromegaly and pituitary gigantism: Secondary | ICD-10-CM

## 2018-07-12 NOTE — Progress Notes (Signed)
Nurse visit only.  Did not meet with provider.

## 2018-07-16 DIAGNOSIS — D485 Neoplasm of uncertain behavior of skin: Secondary | ICD-10-CM | POA: Diagnosis not present

## 2018-07-16 DIAGNOSIS — D224 Melanocytic nevi of scalp and neck: Secondary | ICD-10-CM | POA: Diagnosis not present

## 2018-07-16 DIAGNOSIS — D229 Melanocytic nevi, unspecified: Secondary | ICD-10-CM | POA: Diagnosis not present

## 2018-07-21 DIAGNOSIS — M25551 Pain in right hip: Secondary | ICD-10-CM | POA: Diagnosis not present

## 2018-07-27 ENCOUNTER — Other Ambulatory Visit: Payer: Self-pay

## 2018-07-27 ENCOUNTER — Ambulatory Visit (INDEPENDENT_AMBULATORY_CARE_PROVIDER_SITE_OTHER): Payer: BC Managed Care – PPO | Admitting: Family Medicine

## 2018-07-27 DIAGNOSIS — E291 Testicular hypofunction: Secondary | ICD-10-CM | POA: Diagnosis not present

## 2018-07-27 DIAGNOSIS — E220 Acromegaly and pituitary gigantism: Secondary | ICD-10-CM

## 2018-07-27 NOTE — Progress Notes (Signed)
Nurse visit for injection only.  Did not see provider.

## 2018-08-09 DIAGNOSIS — I7789 Other specified disorders of arteries and arterioles: Secondary | ICD-10-CM | POA: Diagnosis not present

## 2018-08-09 DIAGNOSIS — I712 Thoracic aortic aneurysm, without rupture: Secondary | ICD-10-CM | POA: Diagnosis not present

## 2018-08-10 ENCOUNTER — Ambulatory Visit: Payer: BC Managed Care – PPO

## 2018-08-11 ENCOUNTER — Other Ambulatory Visit: Payer: Self-pay

## 2018-08-11 ENCOUNTER — Ambulatory Visit (INDEPENDENT_AMBULATORY_CARE_PROVIDER_SITE_OTHER): Payer: BC Managed Care – PPO | Admitting: Family Medicine

## 2018-08-11 DIAGNOSIS — E291 Testicular hypofunction: Secondary | ICD-10-CM

## 2018-08-11 DIAGNOSIS — E220 Acromegaly and pituitary gigantism: Secondary | ICD-10-CM | POA: Diagnosis not present

## 2018-08-11 DIAGNOSIS — E2740 Unspecified adrenocortical insufficiency: Secondary | ICD-10-CM

## 2018-08-13 DIAGNOSIS — E220 Acromegaly and pituitary gigantism: Secondary | ICD-10-CM | POA: Diagnosis not present

## 2018-08-17 ENCOUNTER — Telehealth: Payer: Self-pay | Admitting: Family Medicine

## 2018-08-17 DIAGNOSIS — E220 Acromegaly and pituitary gigantism: Secondary | ICD-10-CM | POA: Diagnosis not present

## 2018-08-17 NOTE — Telephone Encounter (Signed)
Discussed that we will not be able to continue octreotide prescription here due to the fact that it is not prescribed by a provider here.  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 since 48 years old  Explained that the policies have changed and we are trying to provide transparent, consistent care.  We are not monitoring this medication but at this point we can discuss ways to get hiim care within [facility] such as going to [facility].  He voiced his concern about the inconvenience this will cause.  He will discuss with his Endocrinology.  He reports that Dr. Carlota Raspberry is an exceptionally good physician. This provider encouraged him to continue his relationship and keep this his medical home but to see a specialty practice for this care.  He will consider and discuss with Endocrinology and let us know.   Will mail a copy of a letter to document this conversation.

## 2018-08-18 ENCOUNTER — Telehealth: Payer: Self-pay

## 2018-08-18 NOTE — Telephone Encounter (Signed)
Letter from Dr. Pamella Pert sent to pt via mail.  Copy sent to be scanned into chart.

## 2018-08-24 NOTE — Telephone Encounter (Signed)
Edit:  Letter was from Dr. Nolon Rod

## 2018-08-25 ENCOUNTER — Other Ambulatory Visit: Payer: Self-pay

## 2018-08-25 ENCOUNTER — Ambulatory Visit (INDEPENDENT_AMBULATORY_CARE_PROVIDER_SITE_OTHER): Payer: BC Managed Care – PPO | Admitting: Registered Nurse

## 2018-08-25 DIAGNOSIS — E291 Testicular hypofunction: Secondary | ICD-10-CM | POA: Diagnosis not present

## 2018-08-25 NOTE — Patient Instructions (Signed)
Injection given in the right glut, he tolerated well.

## 2018-09-08 ENCOUNTER — Other Ambulatory Visit: Payer: Self-pay

## 2018-09-08 ENCOUNTER — Ambulatory Visit (INDEPENDENT_AMBULATORY_CARE_PROVIDER_SITE_OTHER): Payer: BC Managed Care – PPO | Admitting: Family Medicine

## 2018-09-08 DIAGNOSIS — E291 Testicular hypofunction: Secondary | ICD-10-CM

## 2018-09-08 NOTE — Patient Instructions (Signed)
Injection given in the right glut. He tolerated well.

## 2018-09-22 ENCOUNTER — Other Ambulatory Visit: Payer: Self-pay

## 2018-09-22 ENCOUNTER — Ambulatory Visit (INDEPENDENT_AMBULATORY_CARE_PROVIDER_SITE_OTHER): Payer: BC Managed Care – PPO | Admitting: Family Medicine

## 2018-09-22 DIAGNOSIS — E291 Testicular hypofunction: Secondary | ICD-10-CM

## 2018-09-22 NOTE — Progress Notes (Signed)
Nurse visit only

## 2018-10-06 ENCOUNTER — Ambulatory Visit: Payer: BC Managed Care – PPO | Admitting: Family Medicine

## 2018-10-06 ENCOUNTER — Other Ambulatory Visit: Payer: Self-pay

## 2018-10-06 DIAGNOSIS — E291 Testicular hypofunction: Secondary | ICD-10-CM

## 2018-10-06 MED ORDER — TESTOSTERONE CYPIONATE 200 MG/ML IM SOLN
100.0000 mg | INTRAMUSCULAR | Status: AC
  Administered 2018-10-06: 100 mg via INTRAMUSCULAR
  Administered 2018-11-19: 11:00:00 200 mg via INTRAMUSCULAR
  Administered 2018-12-17: 100 mg via INTRAMUSCULAR

## 2018-10-11 DIAGNOSIS — E785 Hyperlipidemia, unspecified: Secondary | ICD-10-CM | POA: Insufficient documentation

## 2018-10-11 DIAGNOSIS — I10 Essential (primary) hypertension: Secondary | ICD-10-CM | POA: Insufficient documentation

## 2018-10-13 DIAGNOSIS — Z7952 Long term (current) use of systemic steroids: Secondary | ICD-10-CM | POA: Diagnosis not present

## 2018-10-13 DIAGNOSIS — Z981 Arthrodesis status: Secondary | ICD-10-CM | POA: Diagnosis not present

## 2018-10-13 DIAGNOSIS — J90 Pleural effusion, not elsewhere classified: Secondary | ICD-10-CM | POA: Diagnosis not present

## 2018-10-13 DIAGNOSIS — Z01810 Encounter for preprocedural cardiovascular examination: Secondary | ICD-10-CM | POA: Diagnosis not present

## 2018-10-13 DIAGNOSIS — Z79899 Other long term (current) drug therapy: Secondary | ICD-10-CM | POA: Diagnosis not present

## 2018-10-13 DIAGNOSIS — I10 Essential (primary) hypertension: Secondary | ICD-10-CM | POA: Diagnosis not present

## 2018-10-13 DIAGNOSIS — Z452 Encounter for adjustment and management of vascular access device: Secondary | ICD-10-CM | POA: Diagnosis not present

## 2018-10-13 DIAGNOSIS — I712 Thoracic aortic aneurysm, without rupture: Secondary | ICD-10-CM | POA: Diagnosis not present

## 2018-10-13 DIAGNOSIS — T380X5A Adverse effect of glucocorticoids and synthetic analogues, initial encounter: Secondary | ICD-10-CM | POA: Diagnosis not present

## 2018-10-13 DIAGNOSIS — E230 Hypopituitarism: Secondary | ICD-10-CM | POA: Diagnosis not present

## 2018-10-13 DIAGNOSIS — R918 Other nonspecific abnormal finding of lung field: Secondary | ICD-10-CM | POA: Diagnosis not present

## 2018-10-13 DIAGNOSIS — Z7901 Long term (current) use of anticoagulants: Secondary | ICD-10-CM | POA: Diagnosis not present

## 2018-10-13 DIAGNOSIS — I719 Aortic aneurysm of unspecified site, without rupture: Secondary | ICD-10-CM | POA: Diagnosis not present

## 2018-10-13 DIAGNOSIS — Q2543 Congenital aneurysm of aorta: Secondary | ICD-10-CM | POA: Diagnosis not present

## 2018-10-13 DIAGNOSIS — E2749 Other adrenocortical insufficiency: Secondary | ICD-10-CM | POA: Diagnosis not present

## 2018-10-13 DIAGNOSIS — E162 Hypoglycemia, unspecified: Secondary | ICD-10-CM | POA: Diagnosis not present

## 2018-10-13 DIAGNOSIS — J939 Pneumothorax, unspecified: Secondary | ICD-10-CM | POA: Diagnosis not present

## 2018-10-13 DIAGNOSIS — E785 Hyperlipidemia, unspecified: Secondary | ICD-10-CM | POA: Diagnosis not present

## 2018-10-13 DIAGNOSIS — F329 Major depressive disorder, single episode, unspecified: Secondary | ICD-10-CM | POA: Diagnosis not present

## 2018-10-13 DIAGNOSIS — E220 Acromegaly and pituitary gigantism: Secondary | ICD-10-CM | POA: Diagnosis not present

## 2018-10-13 DIAGNOSIS — I428 Other cardiomyopathies: Secondary | ICD-10-CM | POA: Diagnosis not present

## 2018-10-13 DIAGNOSIS — Z9889 Other specified postprocedural states: Secondary | ICD-10-CM | POA: Diagnosis not present

## 2018-10-13 DIAGNOSIS — I7789 Other specified disorders of arteries and arterioles: Secondary | ICD-10-CM | POA: Diagnosis not present

## 2018-10-13 DIAGNOSIS — E2740 Unspecified adrenocortical insufficiency: Secondary | ICD-10-CM | POA: Diagnosis not present

## 2018-10-13 DIAGNOSIS — I351 Nonrheumatic aortic (valve) insufficiency: Secondary | ICD-10-CM | POA: Diagnosis not present

## 2018-10-13 DIAGNOSIS — Z888 Allergy status to other drugs, medicaments and biological substances status: Secondary | ICD-10-CM | POA: Diagnosis not present

## 2018-10-13 DIAGNOSIS — Z7982 Long term (current) use of aspirin: Secondary | ICD-10-CM | POA: Diagnosis not present

## 2018-10-13 DIAGNOSIS — Z885 Allergy status to narcotic agent status: Secondary | ICD-10-CM | POA: Diagnosis not present

## 2018-10-13 DIAGNOSIS — I4892 Unspecified atrial flutter: Secondary | ICD-10-CM | POA: Diagnosis not present

## 2018-10-14 DIAGNOSIS — D696 Thrombocytopenia, unspecified: Secondary | ICD-10-CM | POA: Insufficient documentation

## 2018-10-17 DIAGNOSIS — R066 Hiccough: Secondary | ICD-10-CM | POA: Insufficient documentation

## 2018-10-20 NOTE — Progress Notes (Unsigned)
Nurse visit.  Did not see provider at this visit.

## 2018-10-21 ENCOUNTER — Ambulatory Visit: Payer: BC Managed Care – PPO | Admitting: Family Medicine

## 2018-10-21 ENCOUNTER — Other Ambulatory Visit: Payer: Self-pay

## 2018-10-21 ENCOUNTER — Encounter: Payer: Self-pay | Admitting: Family Medicine

## 2018-10-21 VITALS — BP 120/77 | HR 70 | Temp 98.8°F | Wt 252.1 lb

## 2018-10-21 DIAGNOSIS — Z5181 Encounter for therapeutic drug level monitoring: Secondary | ICD-10-CM | POA: Diagnosis not present

## 2018-10-21 DIAGNOSIS — E291 Testicular hypofunction: Secondary | ICD-10-CM

## 2018-10-21 DIAGNOSIS — Z7901 Long term (current) use of anticoagulants: Secondary | ICD-10-CM | POA: Diagnosis not present

## 2018-10-21 DIAGNOSIS — Z952 Presence of prosthetic heart valve: Secondary | ICD-10-CM

## 2018-10-21 MED ORDER — OXYCODONE HCL 5 MG PO TABS
5.00 | ORAL_TABLET | ORAL | Status: DC
Start: ? — End: 2018-10-21

## 2018-10-21 MED ORDER — POTASSIUM CHLORIDE ER 20 MEQ PO TBCR
20.00 | EXTENDED_RELEASE_TABLET | ORAL | Status: DC
Start: ? — End: 2018-10-21

## 2018-10-21 MED ORDER — PANTOPRAZOLE SODIUM 40 MG PO TBEC
40.00 | DELAYED_RELEASE_TABLET | ORAL | Status: DC
Start: 2018-10-20 — End: 2018-10-21

## 2018-10-21 MED ORDER — GENERIC EXTERNAL MEDICATION
12.50 | Status: DC
Start: 2018-10-19 — End: 2018-10-21

## 2018-10-21 MED ORDER — METOCLOPRAMIDE HCL 5 MG/ML IJ SOLN
10.00 | INTRAMUSCULAR | Status: DC
Start: 2018-10-19 — End: 2018-10-21

## 2018-10-21 MED ORDER — LIDOCAINE 5 % EX PTCH
2.00 | MEDICATED_PATCH | CUTANEOUS | Status: DC
Start: 2018-10-20 — End: 2018-10-21

## 2018-10-21 MED ORDER — ONDANSETRON HCL 4 MG/2ML IJ SOLN
4.00 | INTRAMUSCULAR | Status: DC
Start: ? — End: 2018-10-21

## 2018-10-21 MED ORDER — GABAPENTIN 100 MG PO CAPS
100.00 | ORAL_CAPSULE | ORAL | Status: DC
Start: 2018-10-19 — End: 2018-10-21

## 2018-10-21 MED ORDER — GLUCAGON HCL RDNA (DIAGNOSTIC) 1 MG IJ SOLR
1.00 | INTRAMUSCULAR | Status: DC
Start: ? — End: 2018-10-21

## 2018-10-21 MED ORDER — SENNOSIDES-DOCUSATE SODIUM 8.6-50 MG PO TABS
2.00 | ORAL_TABLET | ORAL | Status: DC
Start: 2018-10-19 — End: 2018-10-21

## 2018-10-21 MED ORDER — GENERIC EXTERNAL MEDICATION
200.00 | Status: DC
Start: 2018-10-20 — End: 2018-10-21

## 2018-10-21 MED ORDER — ASPIRIN 81 MG PO CHEW
81.00 | CHEWABLE_TABLET | ORAL | Status: DC
Start: 2018-10-20 — End: 2018-10-21

## 2018-10-21 MED ORDER — WARFARIN SODIUM 5 MG PO TABS
5.00 | ORAL_TABLET | ORAL | Status: DC
Start: ? — End: 2018-10-21

## 2018-10-21 MED ORDER — HYDROCORTISONE 10 MG PO TABS
10.00 | ORAL_TABLET | ORAL | Status: DC
Start: 2018-10-19 — End: 2018-10-21

## 2018-10-21 MED ORDER — GENERIC EXTERNAL MEDICATION
Status: DC
Start: ? — End: 2018-10-21

## 2018-10-21 MED ORDER — BISACODYL 10 MG RE SUPP
10.00 | RECTAL | Status: DC
Start: ? — End: 2018-10-21

## 2018-10-21 MED ORDER — HYDROCORTISONE 10 MG PO TABS
20.00 | ORAL_TABLET | ORAL | Status: DC
Start: 2018-10-20 — End: 2018-10-21

## 2018-10-21 MED ORDER — ATORVASTATIN CALCIUM 40 MG PO TABS
40.00 | ORAL_TABLET | ORAL | Status: DC
Start: 2018-10-19 — End: 2018-10-21

## 2018-10-21 MED ORDER — TESTOSTERONE CYPIONATE 200 MG/ML IM SOLN
100.0000 mg | Freq: Once | INTRAMUSCULAR | Status: AC
  Administered 2018-10-21: 100 mg via INTRAMUSCULAR

## 2018-10-21 MED ORDER — MELATONIN 3 MG PO TABS
6.00 | ORAL_TABLET | ORAL | Status: DC
Start: 2018-10-19 — End: 2018-10-21

## 2018-10-21 MED ORDER — DEXTROSE 50 % IV SOLN
12.50 | INTRAVENOUS | Status: DC
Start: ? — End: 2018-10-21

## 2018-10-21 MED ORDER — POLYETHYLENE GLYCOL 3350 17 G PO PACK
17.00 | PACK | ORAL | Status: DC
Start: 2018-10-20 — End: 2018-10-21

## 2018-10-21 MED ORDER — FUROSEMIDE 40 MG PO TABS
40.00 | ORAL_TABLET | ORAL | Status: DC
Start: ? — End: 2018-10-21

## 2018-10-21 NOTE — Patient Instructions (Addendum)
I will refer you to coumadin clinic, but I will check PT/INR today.  Plan for checking INR weekly for 4 weeks, then biweekly for 2 weeks, then monthly if stable (or more often if not stable).  Recheck with me in 2 weeks.    Return to the clinic or go to the nearest emergency room if any of your symptoms worsen or new symptoms occur.   If you have lab work done today you will be contacted with your lab results within the next 2 weeks.  If you have not heard from [place] then please contact us. The fastest way to get your results is to register for My Chart.   IF you received an x-ray today, you will receive an invoice from [facility]. Please contact [facility] at [phone] with questions or concerns regarding your invoice.   IF you received labwork today, you will receive an invoice from [facility]. Please contact [facility] at [phone] with questions or concerns regarding your invoice.   Our billing staff will not be able to assist you with questions regarding bills from these companies.  You will be contacted with the lab results as soon as they are available. The fastest way to get your results is to activate your My Chart account. Instructions are located on the last page of this paperwork. If you have not heard from [place] regarding the results in 2 weeks, please contact this office.

## 2018-10-21 NOTE — Progress Notes (Signed)
Subjective:    Patient ID: Mark Gallagher, male    DOB: 04-20-1970, 48 y.o.   MRN: [id number]  HPI Mark Gallagher is a 48 y.o. male Presents today for: Chief Complaint  Patient presents with  . 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 Doing much better. Still have chest pain from the surgery.   Aortic arch aneurysm: Here for hospital follow-up.  History of aortic arch aneurysm, aortic valve regurgitation.  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  Surgery on September 24 at [facility], Dr. Ysidro Evert.  Discharged 2 days ago, September 29, with home health and home health physical therapy. Anticoagulation, still discharged home on Coumadin with an INR goal of 2-3 for mechanical aortic valve(also noted can be decreased INR to 1.5-2.0 goal after 3 months postop).  Plan for checking INR weekly for 4 weeks, then biweekly for 2 weeks, then monthly with stable or more often if not stable.  Plan for INR to be managed by me. Plans on cardiology checking INR in 1 week, but otherwise plans on having that managed here.   Coumadin dosing: 5mg  on t/th/sat, then 2.5mg  on mon, wed, fri, sun.  No missed doses.  No new bleeding - minimal with shaving.   Sternal precautions were given including avoiding pushing/pulling objects with arms for 6 weeks, avoid stretching arms backwards for 10 days from surgery.  Avoid lifting with one arm for 6 weeks from surgery.  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  Stress dose steroids were given transition to oral steroids at time of discharge.  Transition to current outpatient regimen of Sandostatin, Somavert,  testosterone, and inpatient regimen with hydrocortisone 20 mg / 10 mg.  Plan for outpatient endocrinology follow-up.  No increased swelling. Weight has been decreasing - 250 on 9/29,  250 yesterday, 249 today. Still taking lasix.   BM normal today. On oxycodone for pain. miralax for BM.    Patient Active Problem List   Diagnosis Date Noted  . Chronic adrenal insufficiency ([place]) 03/26/2018  . Hyponatremia 03/26/2018  . Anasarca 03/26/2018  . Hyperbilirubinemia 03/26/2018  . Shock circulatory ([place])   . Primary localized osteoarthritis of hip 03/16/2018  . S/P total hip arthroplasty 03/16/2018  . Hypothyroidism 02/22/2018  . History of fusion of cervical spine 02/22/2018  . Iron deficiency anemia 02/22/2018  . Acquired left foot drop 02/22/2018  . Primary osteoarthritis of right hip 02/22/2018  . 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 Constipation 12/15/2016  . Traumatic closed fracture of distal clavicle with minimal displacement, left, with routine healing, subsequent encounter 03/24/2016  . Left knee pain 09/25/2015  . Adjustment disorder with depressed mood   . Tetraplegia ([place]) 09/19/2014  . Acute blood loss anemia 09/19/2014  . Cervical spondylosis with myelopathy 09/18/2014  . Radiculopathy of arm 08/28/2014  . Hypogonadism male 09/08/2013  . 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 Cardiomyopathy 04/01/2011  . Wolff-Parkinson-White (WPW) syndrome 04/01/2011   Past Medical History:  Diagnosis Date  . 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 Arthritis   . Cancer ([place])   . History of pituitary tumor   . Hypertension   . Hypothyroidism   . Knee pain, bilateral   . Pneumonia   . Testosterone deficiency   . Thyroid  disease    Past Surgical History:  Procedure Laterality Date  . CARDIAC ELECTROPHYSIOLOGY STUDY AND ABLATION    . CERVICAL DISC SURGERY  2007   Anterior C4/5 vertebrectomy and c3-C6 laminoplasty with plating  . CRANIOTOMY FOR TUMOR     pituitary adenoma  . TOTAL  HIP ARTHROPLASTY Right 03/16/2018   Procedure: TOTAL HIP ARTHROPLASTY Anterior;  Surgeon: Renette Butters, MD;  Location: [facility];  Service: Orthopedics;  Laterality: Right;   Allergies  Allergen Reactions  . Oxycodone Nausea Only   Prior to Admission medications   Medication Sig Start Date End Date Taking? Authorizing Provider  atorvastatin (LIPITOR) 40 MG tablet Take 40 mg by mouth daily.   Yes [provider]  Cholecalciferol (VITAMIN D3) 25 MCG (1000 UT) CAPS Take 2,000 Units by mouth daily.    Yes [provider]  docusate sodium (COLACE) 250 MG capsule Take 250 mg by mouth daily.   Yes [provider]  eplerenone (INSPRA) 50 MG tablet Take 50 mg by mouth daily.   Yes [provider]  ferrous sulfate 325 (65 FE) MG tablet Take 1 tablet (325 mg total) by mouth 2 (two) times daily with a meal. 10/05/14  Yes Love, Ivan Anchors, PA-C  hydrocortisone (CORTEF) 10 MG tablet Take 1 tablet daily.  You may take 2 tablets daily with excessive weakness and tiredness and lethargy. Patient taking differently: Take 10 mg by mouth daily.  03/23/18  Yes Georgette Shell, MD  levothyroxine (SYNTHROID, LEVOTHROID) 200 MCG tablet Take 1 tablet (200 mcg total) by mouth daily. 10/05/14  Yes Love, Ivan Anchors, PA-C  Multiple Vitamins-Minerals (CENTRUM SILVER PO) Take 1 tablet by mouth daily.   Yes [provider]  octreotide (SANDOSTATIN LAR) 30 MG injection Inject 30 mg into the muscle every 28 (twenty-eight) days.    Yes [provider]  polyethylene glycol (MIRALAX / GLYCOLAX) packet Take 17 g by mouth daily as needed for mild constipation.   Yes [provider]  warfarin (COUMADIN) 5 MG tablet Take 5 mg by mouth daily. 5mg  on Tues, Thur and Sat then 0.5mg  Mon, Wed, Fri, Sun   Yes [provider]   Social History   Socioeconomic History  . Marital status: Single    Spouse name: Not on file  . Number of children: Not on file  . Years of  education: Not on file  . Highest education level: Not on file  Occupational History  . Not on file  Social Needs  . Financial resource strain: Not on file  . Food insecurity    Worry: Not on file    Inability: Not on file  . Transportation needs    Medical: Not on file    Non-medical: Not on file  Tobacco Use  . Smoking status: Never Smoker  . Smokeless tobacco: Never Used  Substance and Sexual Activity  . Alcohol use: No  . Drug use: No  . Sexual activity: Not on file  Lifestyle  . Physical activity    Days per week: Not on file    Minutes per session: Not on file  . Stress: Not on file  Relationships  . Social Herbalist on phone: Not on file    Gets together: Not on file    Attends religious service: Not on file    Active member of club or organization: Not on file    Attends meetings of clubs or organizations: Not on file  Relationship status: Not on file  . Intimate partner violence    Fear of current or ex partner: Not on file    Emotionally abused: Not on file    Physically abused: Not on file    Forced sexual activity: Not on file  Other Topics Concern  . Not on file  Social History Narrative  . Not on file    Review of Systems Per HPI    Objective:   Physical Exam Vitals signs reviewed.  Constitutional:      Appearance: He is well-developed.  HENT:     Head: Normocephalic and atraumatic.  Eyes:     Pupils: Pupils are equal, round, and reactive to light.  Neck:     Vascular: No carotid bruit or JVD.  Cardiovascular:     Rate and Rhythm: Normal rate and regular rhythm.     Heart sounds: Normal heart sounds. No murmur.  Pulmonary:     Effort: Pulmonary effort is normal.     Breath sounds: Normal breath sounds. No rales.  Skin:    General: Skin is warm and dry.       Neurological:     Mental Status: He is alert and oriented to person, place, and time.    Vitals:   10/21/18 1646  BP: 120/77  Pulse: 70  Temp: 98.8 F (37.1 C)   TempSrc: Oral  SpO2: 96%  Weight: 252 lb 1.6 oz (114.4 kg)   Over 15 min chart review.     Assessment & Plan:   Mark Gallagher is a 48 y.o. male Anticoagulation goal of INR 2 to 3 - Plan: Protime-INR, Ambulatory referral to Anticoagulation Monitoring S/P AVR (aortic valve replacement) and aortoplasty  -Currently on Coumadin 5 mg daily 3 days/week, 2.5 mg on other 4 days.  -Check INR today.  Will follow locally at this time, but referral was placed to coagulation clinic for ease of monitoring.  -Plan for repeat INR at cardiology/cardiothoracic surgeon next week, then follow-up with me in 2 weeks for INR/adjustments.  Continued bowel regimen discussed, pain appears to be controlled.  No signs of anasarca/edema that he experienced after previous surgery  Monitoring home weights, and currently on diuretics.  Followed by endocrinology for panhypopituitarism.  Hypogonadism in male - Plan: testosterone cypionate (DEPOTESTOSTERONE CYPIONATE) injection 100 mg   Meds ordered this encounter  Medications  . testosterone cypionate (DEPOTESTOSTERONE CYPIONATE) injection 100 mg   Patient Instructions   I will refer you to coumadin clinic, but I will check PT/INR today.  Plan for checking INR weekly for 4 weeks, then biweekly for 2 weeks, then monthly if stable (or more often if not stable).  Recheck with me in 2 weeks.    Return to the clinic or go to the nearest emergency room if any of your symptoms worsen or new symptoms occur.   If you have lab work done today you will be contacted with your lab results within the next 2 weeks.  If you have not heard from [place] then please contact us. The fastest way to get your results is to register for My Chart.   IF you received an x-ray today, you will receive an invoice from [facility]. Please contact [facility] at [phone] with questions or concerns regarding your invoice.   IF you received labwork today, you will receive an  invoice from [facility]. Please contact [facility] at [phone] with questions or concerns regarding your invoice.   Our billing staff will not be  able to assist you with questions regarding bills from these companies.  You will be contacted with the lab results as soon as they are available. The fastest way to get your results is to activate your My Chart account. Instructions are located on the last page of this paperwork. If you have not heard from [place] regarding the results in 2 weeks, please contact this office.       Signed,   [name], MD [facility] [place].  10/21/18 7:03 PM

## 2018-10-22 LAB — PROTIME-INR
INR: 1.4 — ABNORMAL HIGH (ref 0.8–1.2)
Prothrombin Time: 14.8 s — ABNORMAL HIGH (ref 9.1–12.0)

## 2018-10-25 ENCOUNTER — Other Ambulatory Visit: Payer: Self-pay | Admitting: Family Medicine

## 2018-10-25 DIAGNOSIS — E220 Acromegaly and pituitary gigantism: Secondary | ICD-10-CM | POA: Diagnosis not present

## 2018-10-25 DIAGNOSIS — Z5181 Encounter for therapeutic drug level monitoring: Secondary | ICD-10-CM

## 2018-10-25 DIAGNOSIS — Z952 Presence of prosthetic heart valve: Secondary | ICD-10-CM | POA: Diagnosis not present

## 2018-10-25 DIAGNOSIS — Z7901 Long term (current) use of anticoagulants: Secondary | ICD-10-CM

## 2018-10-26 ENCOUNTER — Ambulatory Visit: Payer: BC Managed Care – PPO

## 2018-10-28 DIAGNOSIS — J90 Pleural effusion, not elsewhere classified: Secondary | ICD-10-CM | POA: Diagnosis not present

## 2018-10-28 DIAGNOSIS — Z9889 Other specified postprocedural states: Secondary | ICD-10-CM | POA: Diagnosis not present

## 2018-10-28 DIAGNOSIS — Z952 Presence of prosthetic heart valve: Secondary | ICD-10-CM | POA: Diagnosis not present

## 2018-10-28 DIAGNOSIS — I517 Cardiomegaly: Secondary | ICD-10-CM | POA: Diagnosis not present

## 2018-10-28 DIAGNOSIS — Z8679 Personal history of other diseases of the circulatory system: Secondary | ICD-10-CM | POA: Diagnosis not present

## 2018-10-29 ENCOUNTER — Telehealth: Payer: Self-pay | Admitting: Family Medicine

## 2018-10-29 ENCOUNTER — Ambulatory Visit (INDEPENDENT_AMBULATORY_CARE_PROVIDER_SITE_OTHER): Payer: BC Managed Care – PPO | Admitting: Family Medicine

## 2018-10-29 ENCOUNTER — Other Ambulatory Visit: Payer: Self-pay

## 2018-10-29 DIAGNOSIS — Z23 Encounter for immunization: Secondary | ICD-10-CM

## 2018-10-29 NOTE — Telephone Encounter (Signed)
Here for vaccine, but update on INR discussed.   Saw [facility] yesterday. INR 1.2.  Had been taking 5mg  coumadin daily prior 4 days.  Recommended 10mg  yesterday. Then alternating b/t 5 and 7.5 mg every other day, with repeat testing in 4 days.   NP Melissa with Dr. Ysidro Evert at [facility] called today. She also tried to refer to coumadin clinic locally - [facility], was advised that needs provider in that practice to follow. He would like to remain with INR checking here.  Plan to repeat INR in 4 days - advised to call me if results not received by end of next day.  However, I may still need him to be followed by Coumadin clinic locally, especially if difficulty with control.   [facility] Mychart account says he needs pneumovax? Not sure. He will clarify with endocrinologist and if needed - ok to give pneumovax at next visit.

## 2018-11-02 ENCOUNTER — Encounter: Payer: Self-pay | Admitting: Family Medicine

## 2018-11-02 ENCOUNTER — Ambulatory Visit (INDEPENDENT_AMBULATORY_CARE_PROVIDER_SITE_OTHER): Payer: BC Managed Care – PPO | Admitting: Family Medicine

## 2018-11-02 ENCOUNTER — Other Ambulatory Visit: Payer: Self-pay

## 2018-11-02 DIAGNOSIS — Z5181 Encounter for therapeutic drug level monitoring: Secondary | ICD-10-CM | POA: Diagnosis not present

## 2018-11-02 DIAGNOSIS — E2740 Unspecified adrenocortical insufficiency: Secondary | ICD-10-CM | POA: Diagnosis not present

## 2018-11-02 DIAGNOSIS — Z7901 Long term (current) use of anticoagulants: Secondary | ICD-10-CM

## 2018-11-02 NOTE — Progress Notes (Signed)
Nurse visit for labs only

## 2018-11-03 ENCOUNTER — Other Ambulatory Visit: Payer: Self-pay | Admitting: Family Medicine

## 2018-11-03 DIAGNOSIS — Z7901 Long term (current) use of anticoagulants: Secondary | ICD-10-CM

## 2018-11-03 DIAGNOSIS — Z952 Presence of prosthetic heart valve: Secondary | ICD-10-CM

## 2018-11-03 DIAGNOSIS — Z5181 Encounter for therapeutic drug level monitoring: Secondary | ICD-10-CM

## 2018-11-03 LAB — PROTIME-INR
INR: 1.2 (ref 0.9–1.2)
Prothrombin Time: 12.8 s — ABNORMAL HIGH (ref 9.1–12.0)

## 2018-11-04 ENCOUNTER — Ambulatory Visit (INDEPENDENT_AMBULATORY_CARE_PROVIDER_SITE_OTHER): Payer: BC Managed Care – PPO | Admitting: Family Medicine

## 2018-11-04 ENCOUNTER — Ambulatory Visit: Payer: BC Managed Care – PPO

## 2018-11-04 ENCOUNTER — Other Ambulatory Visit: Payer: Self-pay

## 2018-11-04 DIAGNOSIS — Z952 Presence of prosthetic heart valve: Secondary | ICD-10-CM

## 2018-11-04 DIAGNOSIS — E291 Testicular hypofunction: Secondary | ICD-10-CM | POA: Diagnosis not present

## 2018-11-04 DIAGNOSIS — Z5181 Encounter for therapeutic drug level monitoring: Secondary | ICD-10-CM

## 2018-11-04 DIAGNOSIS — Z7901 Long term (current) use of anticoagulants: Secondary | ICD-10-CM

## 2018-11-04 MED ORDER — TESTOSTERONE CYPIONATE 200 MG/ML IM SOLN
100.0000 mg | INTRAMUSCULAR | Status: DC
  Administered 2018-11-04 – 2019-08-04 (×12): 100 mg via INTRAMUSCULAR

## 2018-11-04 NOTE — Patient Instructions (Signed)
Injection given in the right glut, he tolerated well.  

## 2018-11-05 ENCOUNTER — Ambulatory Visit (INDEPENDENT_AMBULATORY_CARE_PROVIDER_SITE_OTHER): Payer: BC Managed Care – PPO | Admitting: Family Medicine

## 2018-11-05 DIAGNOSIS — Z7901 Long term (current) use of anticoagulants: Secondary | ICD-10-CM | POA: Diagnosis not present

## 2018-11-05 DIAGNOSIS — Z5181 Encounter for therapeutic drug level monitoring: Secondary | ICD-10-CM

## 2018-11-05 LAB — PROTIME-INR
INR: 1.2 (ref 0.9–1.2)
Prothrombin Time: 12.9 s — ABNORMAL HIGH (ref 9.1–12.0)

## 2018-11-05 NOTE — Progress Notes (Signed)
LVMTCB and schedule lab visit

## 2018-11-08 ENCOUNTER — Other Ambulatory Visit: Payer: Self-pay

## 2018-11-08 ENCOUNTER — Telehealth: Payer: Self-pay

## 2018-11-08 ENCOUNTER — Ambulatory Visit (INDEPENDENT_AMBULATORY_CARE_PROVIDER_SITE_OTHER): Payer: BC Managed Care – PPO | Admitting: Family Medicine

## 2018-11-08 ENCOUNTER — Other Ambulatory Visit: Payer: Self-pay | Admitting: Family Medicine

## 2018-11-08 DIAGNOSIS — Z5181 Encounter for therapeutic drug level monitoring: Secondary | ICD-10-CM | POA: Diagnosis not present

## 2018-11-08 DIAGNOSIS — Z7901 Long term (current) use of anticoagulants: Secondary | ICD-10-CM

## 2018-11-08 LAB — PROTIME-INR
INR: 1.3 — ABNORMAL HIGH (ref 0.9–1.2)
Prothrombin Time: 14 s — ABNORMAL HIGH (ref 9.1–12.0)

## 2018-11-08 MED ORDER — WARFARIN SODIUM 5 MG PO TABS: 10.0000 mg | ORAL_TABLET | Freq: Every day | ORAL | 0 refills | Status: DC

## 2018-11-08 NOTE — Progress Notes (Signed)
See lab notes

## 2018-11-08 NOTE — Telephone Encounter (Signed)
Thayer Headings with [facility] calling with STAT labs : INR 1.3 High and Prothrombin Time  14.0  High. They fax a copy as well. No answer in the practice.

## 2018-11-09 ENCOUNTER — Telehealth: Payer: Self-pay | Admitting: Family Medicine

## 2018-11-09 NOTE — Telephone Encounter (Signed)
Attempted to arrange local coag clinic for anticoagulation Cardiology practice asked for patient to transfer care in order for him to be followed. [facility] does not have [place] clinic, and no anticoag clinic through hematology.  Ultimately called [facility] for management through Anticoagulation Management Service, [facility].    Lab Results  Component Value Date   INR 1.3 (H) 11/08/2018   INR 1.2 11/05/2018   INR 1.2 11/02/2018  Goal INR 2-3 with his specific heart valve.  Increased coumadin to 10mg  QD as of last night.   Discussed with Dr.Ortel at [facility]. They will reach out to Mark Gallagher and plan for possible fingerstick INR tomorrow, as well as setting him up with POC monitor at home.  Left message for patient  that their office should be reaching out to him today and I will try to reach him later again to review any questions

## 2018-11-09 NOTE — Telephone Encounter (Signed)
Results give to [place].  She will give to Dr. Carlota Raspberry.  Will forward this message also.

## 2018-11-10 ENCOUNTER — Telehealth: Payer: Self-pay | Admitting: Family Medicine

## 2018-11-10 DIAGNOSIS — Z5181 Encounter for therapeutic drug level monitoring: Secondary | ICD-10-CM | POA: Diagnosis not present

## 2018-11-10 DIAGNOSIS — Z7901 Long term (current) use of anticoagulants: Secondary | ICD-10-CM | POA: Diagnosis not present

## 2018-11-10 DIAGNOSIS — Z981 Arthrodesis status: Secondary | ICD-10-CM | POA: Diagnosis not present

## 2018-11-10 DIAGNOSIS — Z952 Presence of prosthetic heart valve: Secondary | ICD-10-CM | POA: Diagnosis not present

## 2018-11-10 DIAGNOSIS — M4712 Other spondylosis with myelopathy, cervical region: Secondary | ICD-10-CM | POA: Diagnosis not present

## 2018-11-10 DIAGNOSIS — M4802 Spinal stenosis, cervical region: Secondary | ICD-10-CM | POA: Diagnosis not present

## 2018-11-10 DIAGNOSIS — M542 Cervicalgia: Secondary | ICD-10-CM | POA: Diagnosis not present

## 2018-11-10 NOTE — Telephone Encounter (Signed)
Called pt to check status. Mark Gallagher at [place] clinic today. INR today. 1.7 today. Kept on Coumadin 10mg  QD, then 7.5mg  Mondays and Fridays.  Home monitor not an option until in control for 3 months.   Has appt with Dr. Gwenlyn Found at cardiology locally next Tuesday to evaluate for coag clinic.   Appt with cardiology in [facility] on Friday, planning on INR then.   Advised to let me know if I need to coordinate care further or review INR this Friday.

## 2018-11-10 NOTE — Progress Notes (Signed)
Nurse visit. patient not seen.

## 2018-11-12 DIAGNOSIS — I471 Supraventricular tachycardia: Secondary | ICD-10-CM | POA: Diagnosis not present

## 2018-11-12 DIAGNOSIS — I483 Typical atrial flutter: Secondary | ICD-10-CM | POA: Diagnosis not present

## 2018-11-12 DIAGNOSIS — I10 Essential (primary) hypertension: Secondary | ICD-10-CM | POA: Diagnosis not present

## 2018-11-12 DIAGNOSIS — I428 Other cardiomyopathies: Secondary | ICD-10-CM | POA: Diagnosis not present

## 2018-11-16 ENCOUNTER — Ambulatory Visit (INDEPENDENT_AMBULATORY_CARE_PROVIDER_SITE_OTHER): Payer: BC Managed Care – PPO | Admitting: Pharmacist Clinician (PhC)/ Clinical Pharmacy Specialist

## 2018-11-16 ENCOUNTER — Encounter: Payer: Self-pay | Admitting: Cardiovascular Disease

## 2018-11-16 ENCOUNTER — Other Ambulatory Visit: Payer: Self-pay

## 2018-11-16 ENCOUNTER — Ambulatory Visit (INDEPENDENT_AMBULATORY_CARE_PROVIDER_SITE_OTHER): Payer: BC Managed Care – PPO | Admitting: Cardiovascular Disease

## 2018-11-16 VITALS — BP 128/67 | HR 85 | Ht 78.0 in | Wt 238.0 lb

## 2018-11-16 DIAGNOSIS — Z5181 Encounter for therapeutic drug level monitoring: Secondary | ICD-10-CM

## 2018-11-16 DIAGNOSIS — I712 Thoracic aortic aneurysm, without rupture, unspecified: Secondary | ICD-10-CM | POA: Insufficient documentation

## 2018-11-16 DIAGNOSIS — Z7901 Long term (current) use of anticoagulants: Secondary | ICD-10-CM

## 2018-11-16 DIAGNOSIS — Z008 Encounter for other general examination: Secondary | ICD-10-CM

## 2018-11-16 LAB — POCT INR: INR: 1.5 — AB (ref 2.0–3.0)

## 2018-11-16 NOTE — Assessment & Plan Note (Signed)
Mr. Hindley had a 5.7 cm thoracic aortic aneurysm status post resection and grafting with aortic valve replacement with a mechanical valve.  He apparently had cardiac catheterization prior to that that showed normal coronary arteries.  This was done by Dr. Ysidro Evert at [facility] on 10/14/2018.  He is on Coumadin anticoagulation with subtherapeutic INRs.  He is followed at [facility].

## 2018-11-16 NOTE — Progress Notes (Signed)
11/16/2018 Mark Gallagher   18-Jul-1970  [id number]  Primary Physician Mark Agreste, MD Primary Cardiologist: Mark Harp MD Mark Gallagher, [place], [place]  HPI:  Mark Gallagher is a 48 y.o. thin and tall appearing single Caucasian male with no children accompanied by his mother Mark Gallagher today.  He was referred by Dr. Shea Gallagher at primary care [place] to be established in our Coumadin clinic.  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  He is retired from working at [employer] for 25 years on 7/18.  He had thoracic aortic aneurysm resection and grafting with mechanical aortic valve replacement by Dr. He is at [facility] 10/14/2018.  He is on Coumadin anticoagulation.  He is recovered nicely.  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   Current Meds  Medication Sig  . atorvastatin (LIPITOR) 40 MG tablet Take 40 mg by mouth daily.  . Cholecalciferol (VITAMIN D3) 25 MCG (1000 UT) CAPS Take 2,000 Units by mouth daily.   Marland Kitchen docusate sodium (COLACE) 250 MG capsule Take 250 mg by mouth daily.  Marland Kitchen eplerenone (INSPRA) 50 MG tablet Take 50 mg by mouth daily.  . ferrous sulfate 325 (65 FE) MG tablet Take 1 tablet (325 mg total) by mouth 2 (two) times daily with a meal.  . hydrocortisone (CORTEF) 10 MG tablet Take 1 tablet daily.  You may take 2 tablets daily with excessive weakness and tiredness and lethargy. (Patient taking differently: Take 10 mg by mouth daily. )  . levothyroxine (SYNTHROID, LEVOTHROID) 200 MCG tablet Take 1 tablet (200 mcg total) by mouth daily.  . Multiple Vitamins-Minerals (CENTRUM SILVER PO) Take 1 tablet by mouth daily.  Marland Kitchen octreotide (SANDOSTATIN LAR) 30 MG injection Inject 30 mg into the muscle every 28 (twenty-eight) days.   . polyethylene glycol (MIRALAX / GLYCOLAX) packet Take 17 g by mouth daily as needed for mild constipation.  Marland Kitchen warfarin (COUMADIN) 5 MG tablet Take 2 tablets (10 mg total) by mouth daily.   Current Facility-Administered  Medications for the 11/16/18 encounter (Office Visit) with Mark Harp, MD  Medication  . octreotide (SANDOSTATIN LAR) IM injection 30 mg  . testosterone cypionate (DEPOTESTOSTERONE CYPIONATE) injection 100 mg  . testosterone cypionate (DEPOTESTOSTERONE CYPIONATE) injection 100 mg     Allergies  Allergen Reactions  . Metoprolol Other (See Comments)    Back and knee pain 8/10 Pt reports he is currently taking, but must take a lower dose. Higher dose causes Back and knee pain   . Oxycodone Nausea Only    Social History   Socioeconomic History  . Marital status: Single    Spouse name: Not on file  . Number of children: Not on file  . Years of education: Not on file  . Highest education level: Not on file  Occupational History  . Not on file  Social Needs  . Financial resource strain: Not on file  . Food insecurity    Worry: Not on file    Inability: Not on file  . Transportation needs    Medical: Not on file    Non-medical: Not on file  Tobacco Use  . Smoking status: Never Smoker  . Smokeless tobacco: Never Used  Substance and Sexual Activity  . Alcohol use: No  . Drug use: No  . Sexual activity: Not on file  Lifestyle  . Physical activity    Days per week: Not on file    Minutes per session: Not  on file  . Stress: Not on file  Relationships  . Social Herbalist on phone: Not on file    Gets together: Not on file    Attends religious service: Not on file    Active member of club or organization: Not on file    Attends meetings of clubs or organizations: Not on file    Relationship status: Not on file  . Intimate partner violence    Fear of current or ex partner: Not on file    Emotionally abused: Not on file    Physically abused: Not on file    Forced sexual activity: Not on file  Other Topics Concern  . Not on file  Social History Narrative  . Not on file     Review of Systems: General: negative for chills, fever, night sweats or weight  changes.  Cardiovascular: negative for chest pain, dyspnea on exertion, edema, orthopnea, palpitations, paroxysmal nocturnal dyspnea or shortness of breath Dermatological: negative for rash Respiratory: negative for cough or wheezing Urologic: negative for hematuria Abdominal: negative for nausea, vomiting, diarrhea, bright red blood per rectum, melena, or hematemesis Neurologic: negative for visual changes, syncope, or dizziness All other systems reviewed and are otherwise negative except as noted above.    Blood pressure 128/67, pulse 85, height 6\' 6"  (1.981 m), weight 238 lb (108 kg), SpO2 96 %.  General appearance: alert and no distress Neck: no adenopathy, no carotid bruit, no JVD, supple, symmetrical, trachea midline and thyroid not enlarged, symmetric, no tenderness/mass/nodules Lungs: clear to auscultation bilaterally Heart: Crisp prosthetic aortic valve sounds Extremities: extremities normal, atraumatic, no cyanosis or edema Pulses: 2+ and symmetric Skin: Skin color, texture, turgor normal. No rashes or lesions Neurologic: Alert and oriented X 3, normal strength and tone. Normal symmetric reflexes. Normal coordination and gait  EKG sinus rhythm at 85 with right bundle branch block.  I personally reviewed this EKG.  ASSESSMENT AND PLAN:   Anticoagulation goal of INR 2 to 3 Ms. Rode is referred by Dr. Nyoka Cowden for management of his Coumadin anticoagulation in the setting of a mechanical aortic valve.  I referred him to our Coumadin clinic for management of this.  Thoracic aortic aneurysm [facility]) Mark Gallagher had a 5.7 cm thoracic aortic aneurysm status post resection and grafting with aortic valve replacement with a mechanical valve.  He apparently had cardiac catheterization prior to that that showed normal coronary arteries.  This was done by Dr. Ysidro Gallagher at [facility] on 10/14/2018.  He is on Coumadin anticoagulation with subtherapeutic INRs.  He is followed at [facility].      Mark Harp MD FACP,FACC,FAHA, [facility] 11/16/2018 4:40 PM

## 2018-11-16 NOTE — Assessment & Plan Note (Signed)
Ms. Mark Gallagher is referred by Dr. Nyoka Cowden for management of his Coumadin anticoagulation in the setting of a mechanical aortic valve.  I referred him to our Coumadin clinic for management of this.

## 2018-11-16 NOTE — Patient Instructions (Signed)
Medication Instructions:  Your physician recommends that you continue on your current medications as directed. Please refer to the Current Medication list given to you today.  If you need a refill on your cardiac medications before your next appointment, please call your pharmacy.   Lab work: none If you have labs (blood work) drawn today and your tests are completely normal, you will receive your results only by: Marland Kitchen MyChart Message (if you have MyChart) OR . A paper copy in the mail If you have any lab test that is abnormal or we need to change your treatment, we will call you to review the results.  Testing/Procedures: none  Follow-Up: At [facility], you and your health needs are our priority.  As part of our continuing mission to provide you with exceptional heart care, we have created designated Provider Care Teams.  These Care Teams include your primary Cardiologist (physician) and Advanced Practice Providers (APPs -  Physician Assistants and Nurse Practitioners) who all work together to provide you with the care you need, when you need it. You will need a follow up appointment in 18 months with Dr. Quay Burow.  Please call our office 2 months in advance to schedule this appointment.

## 2018-11-19 ENCOUNTER — Other Ambulatory Visit: Payer: Self-pay

## 2018-11-19 ENCOUNTER — Ambulatory Visit (INDEPENDENT_AMBULATORY_CARE_PROVIDER_SITE_OTHER): Payer: BC Managed Care – PPO | Admitting: Family Medicine

## 2018-11-19 DIAGNOSIS — E291 Testicular hypofunction: Secondary | ICD-10-CM | POA: Diagnosis not present

## 2018-11-19 NOTE — Progress Notes (Signed)
Gave pt 200 mg which is 1 ml. RUOQ  He will call provider to see if one month supply is okay.

## 2018-11-22 ENCOUNTER — Other Ambulatory Visit: Payer: Self-pay

## 2018-11-22 ENCOUNTER — Ambulatory Visit (INDEPENDENT_AMBULATORY_CARE_PROVIDER_SITE_OTHER): Payer: BC Managed Care – PPO | Admitting: Pharmacist Clinician (PhC)/ Clinical Pharmacy Specialist

## 2018-11-22 DIAGNOSIS — Z5181 Encounter for therapeutic drug level monitoring: Secondary | ICD-10-CM | POA: Diagnosis not present

## 2018-11-22 DIAGNOSIS — Z952 Presence of prosthetic heart valve: Secondary | ICD-10-CM | POA: Insufficient documentation

## 2018-11-22 DIAGNOSIS — Z7901 Long term (current) use of anticoagulants: Secondary | ICD-10-CM

## 2018-11-22 LAB — POCT INR: INR: 2.2 (ref 2.0–3.0)

## 2018-11-30 ENCOUNTER — Other Ambulatory Visit: Payer: Self-pay

## 2018-11-30 ENCOUNTER — Other Ambulatory Visit: Payer: Self-pay | Admitting: Pharmacist Clinician (PhC)/ Clinical Pharmacy Specialist

## 2018-11-30 ENCOUNTER — Ambulatory Visit (INDEPENDENT_AMBULATORY_CARE_PROVIDER_SITE_OTHER): Payer: BC Managed Care – PPO | Admitting: Pharmacist Clinician (PhC)/ Clinical Pharmacy Specialist

## 2018-11-30 DIAGNOSIS — Z5181 Encounter for therapeutic drug level monitoring: Secondary | ICD-10-CM | POA: Diagnosis not present

## 2018-11-30 DIAGNOSIS — Z7901 Long term (current) use of anticoagulants: Secondary | ICD-10-CM | POA: Diagnosis not present

## 2018-11-30 DIAGNOSIS — Z952 Presence of prosthetic heart valve: Secondary | ICD-10-CM | POA: Diagnosis not present

## 2018-11-30 LAB — POCT INR: INR: 2.8 (ref 2.0–3.0)

## 2018-11-30 MED ORDER — WARFARIN SODIUM 5 MG PO TABS: ORAL_TABLET | ORAL | 3 refills | Status: DC

## 2018-12-10 DIAGNOSIS — D234 Other benign neoplasm of skin of scalp and neck: Secondary | ICD-10-CM | POA: Diagnosis not present

## 2018-12-14 ENCOUNTER — Ambulatory Visit (INDEPENDENT_AMBULATORY_CARE_PROVIDER_SITE_OTHER): Payer: BC Managed Care – PPO | Admitting: Pharmacist Clinician (PhC)/ Clinical Pharmacy Specialist

## 2018-12-14 ENCOUNTER — Other Ambulatory Visit: Payer: Self-pay

## 2018-12-14 DIAGNOSIS — Z5181 Encounter for therapeutic drug level monitoring: Secondary | ICD-10-CM

## 2018-12-14 DIAGNOSIS — Z952 Presence of prosthetic heart valve: Secondary | ICD-10-CM | POA: Diagnosis not present

## 2018-12-14 DIAGNOSIS — Z7901 Long term (current) use of anticoagulants: Secondary | ICD-10-CM | POA: Diagnosis not present

## 2018-12-14 LAB — POCT INR: INR: 2.1 (ref 2.0–3.0)

## 2018-12-17 ENCOUNTER — Other Ambulatory Visit: Payer: Self-pay

## 2018-12-17 ENCOUNTER — Ambulatory Visit (INDEPENDENT_AMBULATORY_CARE_PROVIDER_SITE_OTHER): Payer: BC Managed Care – PPO | Admitting: Family Medicine

## 2018-12-17 DIAGNOSIS — E291 Testicular hypofunction: Secondary | ICD-10-CM | POA: Diagnosis not present

## 2018-12-17 DIAGNOSIS — E039 Hypothyroidism, unspecified: Secondary | ICD-10-CM

## 2018-12-28 DIAGNOSIS — M4712 Other spondylosis with myelopathy, cervical region: Secondary | ICD-10-CM | POA: Diagnosis not present

## 2018-12-28 DIAGNOSIS — M4802 Spinal stenosis, cervical region: Secondary | ICD-10-CM | POA: Diagnosis not present

## 2018-12-28 DIAGNOSIS — D497 Neoplasm of unspecified behavior of endocrine glands and other parts of nervous system: Secondary | ICD-10-CM | POA: Diagnosis not present

## 2018-12-31 ENCOUNTER — Other Ambulatory Visit: Payer: Self-pay

## 2018-12-31 ENCOUNTER — Ambulatory Visit (INDEPENDENT_AMBULATORY_CARE_PROVIDER_SITE_OTHER): Payer: BC Managed Care – PPO | Admitting: Family Medicine

## 2018-12-31 DIAGNOSIS — E291 Testicular hypofunction: Secondary | ICD-10-CM

## 2018-12-31 NOTE — Progress Notes (Signed)
Testosterone shot given. RUOQ Well tolerated.

## 2019-01-07 ENCOUNTER — Other Ambulatory Visit: Payer: Self-pay

## 2019-01-07 ENCOUNTER — Ambulatory Visit (INDEPENDENT_AMBULATORY_CARE_PROVIDER_SITE_OTHER): Payer: BC Managed Care – PPO | Admitting: Pharmacist

## 2019-01-07 ENCOUNTER — Ambulatory Visit: Payer: BC Managed Care – PPO

## 2019-01-07 DIAGNOSIS — Z7901 Long term (current) use of anticoagulants: Secondary | ICD-10-CM

## 2019-01-07 DIAGNOSIS — Z5181 Encounter for therapeutic drug level monitoring: Secondary | ICD-10-CM

## 2019-01-07 DIAGNOSIS — Z952 Presence of prosthetic heart valve: Secondary | ICD-10-CM | POA: Diagnosis not present

## 2019-01-07 LAB — POCT INR: INR: 2.3 (ref 2.0–3.0)

## 2019-01-13 DIAGNOSIS — M47816 Spondylosis without myelopathy or radiculopathy, lumbar region: Secondary | ICD-10-CM | POA: Diagnosis not present

## 2019-01-13 DIAGNOSIS — E220 Acromegaly and pituitary gigantism: Secondary | ICD-10-CM | POA: Diagnosis not present

## 2019-01-13 DIAGNOSIS — Q8740 Marfan's syndrome, unspecified: Secondary | ICD-10-CM | POA: Diagnosis not present

## 2019-01-13 DIAGNOSIS — M48061 Spinal stenosis, lumbar region without neurogenic claudication: Secondary | ICD-10-CM | POA: Diagnosis not present

## 2019-01-13 DIAGNOSIS — M5126 Other intervertebral disc displacement, lumbar region: Secondary | ICD-10-CM | POA: Diagnosis not present

## 2019-01-13 DIAGNOSIS — M4807 Spinal stenosis, lumbosacral region: Secondary | ICD-10-CM | POA: Diagnosis not present

## 2019-01-13 DIAGNOSIS — D497 Neoplasm of unspecified behavior of endocrine glands and other parts of nervous system: Secondary | ICD-10-CM | POA: Diagnosis not present

## 2019-01-17 ENCOUNTER — Other Ambulatory Visit: Payer: Self-pay

## 2019-01-17 ENCOUNTER — Ambulatory Visit (INDEPENDENT_AMBULATORY_CARE_PROVIDER_SITE_OTHER): Payer: BC Managed Care – PPO | Admitting: Family Medicine

## 2019-01-17 DIAGNOSIS — E291 Testicular hypofunction: Secondary | ICD-10-CM | POA: Diagnosis not present

## 2019-01-24 ENCOUNTER — Encounter (HOSPITAL_COMMUNITY): Payer: Self-pay | Admitting: *Deleted

## 2019-01-24 NOTE — Progress Notes (Signed)
Received referral notification from Dr. 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  Reviewed medical history in epic and care everywhere.  Pt has completed his follow up with Dr. Ysidro Evert - heart surgeon, Burnett Kanaris Povsic PA with Dr. Pleas Patricia and Dr. Almyra Free with endocrinology for his adrenal insufficiency.  Pt seen locally by Dr. Gwenlyn Found for establishment in the coumadin clinic for continued management.  MD referral form sent to Dr. Ysidro Evert for his signature and request for 12 lead ekg tracing.  Will advise pt that currently we have paused onsite cardiac rehab and will transition new referrals to our virtual platform after onsite initial assessment. Will forward to support staff to contact pt for interest and ability to participate virtually either by app or telephonic visits. Cherre Huger, BSN Cardiac and Training and development officer

## 2019-02-01 ENCOUNTER — Ambulatory Visit (INDEPENDENT_AMBULATORY_CARE_PROVIDER_SITE_OTHER): Payer: BC Managed Care – PPO | Admitting: Family Medicine

## 2019-02-01 ENCOUNTER — Other Ambulatory Visit: Payer: Self-pay

## 2019-02-01 DIAGNOSIS — E291 Testicular hypofunction: Secondary | ICD-10-CM

## 2019-02-01 MED ORDER — TESTOSTERONE CYPIONATE 200 MG/ML IM SOLN
200.0000 mg | INTRAMUSCULAR | Status: DC
  Administered 2019-02-01 – 2020-05-09 (×7): 200 mg via INTRAMUSCULAR

## 2019-02-04 ENCOUNTER — Ambulatory Visit (INDEPENDENT_AMBULATORY_CARE_PROVIDER_SITE_OTHER): Payer: BC Managed Care – PPO | Admitting: Pharmacist

## 2019-02-04 ENCOUNTER — Other Ambulatory Visit: Payer: Self-pay

## 2019-02-04 DIAGNOSIS — Z7901 Long term (current) use of anticoagulants: Secondary | ICD-10-CM

## 2019-02-04 DIAGNOSIS — Z5181 Encounter for therapeutic drug level monitoring: Secondary | ICD-10-CM | POA: Diagnosis not present

## 2019-02-04 DIAGNOSIS — Z952 Presence of prosthetic heart valve: Secondary | ICD-10-CM

## 2019-02-04 LAB — POCT INR: INR: 2.2 (ref 2.0–3.0)

## 2019-02-15 ENCOUNTER — Ambulatory Visit: Payer: BC Managed Care – PPO

## 2019-02-15 ENCOUNTER — Telehealth (HOSPITAL_COMMUNITY): Payer: Self-pay | Admitting: *Deleted

## 2019-02-15 NOTE — Telephone Encounter (Signed)
Cardiac Rehab  Received referral for cardiac rehab. Contacted patient for scheduling but patient declined participation at this time. Patient states he's walking about a mile 4-5 days/week at home and is doing fine. He doesn't feel that he needs to enroll in cardiac rehab at this time. I advised patient that if he should change his mind to let Dr. Ysidro Evert' office know, and he is agreeable to this.  Sol Passer, MS, ACSM CEP

## 2019-02-16 ENCOUNTER — Ambulatory Visit: Payer: BC Managed Care – PPO

## 2019-02-17 ENCOUNTER — Ambulatory Visit (INDEPENDENT_AMBULATORY_CARE_PROVIDER_SITE_OTHER): Payer: BC Managed Care – PPO | Admitting: Family Medicine

## 2019-02-17 ENCOUNTER — Other Ambulatory Visit: Payer: Self-pay

## 2019-02-17 DIAGNOSIS — E291 Testicular hypofunction: Secondary | ICD-10-CM

## 2019-03-04 ENCOUNTER — Other Ambulatory Visit: Payer: Self-pay

## 2019-03-04 ENCOUNTER — Ambulatory Visit (INDEPENDENT_AMBULATORY_CARE_PROVIDER_SITE_OTHER): Payer: BC Managed Care – PPO | Admitting: Family Medicine

## 2019-03-04 DIAGNOSIS — E039 Hypothyroidism, unspecified: Secondary | ICD-10-CM

## 2019-03-04 NOTE — Progress Notes (Signed)
RUOQ Testosterone shot given. Tolerated well. 100mg  1/2 ml.

## 2019-03-11 ENCOUNTER — Ambulatory Visit (INDEPENDENT_AMBULATORY_CARE_PROVIDER_SITE_OTHER): Payer: BC Managed Care – PPO | Admitting: Pharmacist Clinician (PhC)/ Clinical Pharmacy Specialist

## 2019-03-11 ENCOUNTER — Other Ambulatory Visit: Payer: Self-pay

## 2019-03-11 DIAGNOSIS — Z5181 Encounter for therapeutic drug level monitoring: Secondary | ICD-10-CM | POA: Diagnosis not present

## 2019-03-11 DIAGNOSIS — Z952 Presence of prosthetic heart valve: Secondary | ICD-10-CM | POA: Diagnosis not present

## 2019-03-11 DIAGNOSIS — Z7901 Long term (current) use of anticoagulants: Secondary | ICD-10-CM | POA: Diagnosis not present

## 2019-03-11 LAB — POCT INR: INR: 1.5 — AB (ref 2.0–3.0)

## 2019-03-15 DIAGNOSIS — I471 Supraventricular tachycardia: Secondary | ICD-10-CM | POA: Diagnosis not present

## 2019-03-15 DIAGNOSIS — I10 Essential (primary) hypertension: Secondary | ICD-10-CM | POA: Diagnosis not present

## 2019-03-15 DIAGNOSIS — I428 Other cardiomyopathies: Secondary | ICD-10-CM | POA: Diagnosis not present

## 2019-03-15 DIAGNOSIS — I483 Typical atrial flutter: Secondary | ICD-10-CM | POA: Diagnosis not present

## 2019-03-18 ENCOUNTER — Other Ambulatory Visit: Payer: Self-pay

## 2019-03-18 ENCOUNTER — Ambulatory Visit (INDEPENDENT_AMBULATORY_CARE_PROVIDER_SITE_OTHER): Payer: BC Managed Care – PPO | Admitting: Family Medicine

## 2019-03-18 DIAGNOSIS — E291 Testicular hypofunction: Secondary | ICD-10-CM

## 2019-03-18 NOTE — Patient Instructions (Signed)
° ° ° °  If you have lab work done today you will be contacted with your lab results within the next 2 weeks.  If you have not heard from us then please contact us. The fastest way to get your results is to register for My Chart. ° ° °IF you received an x-ray today, you will receive an invoice from [facility]. Please contact [facility] at [phone] with questions or concerns regarding your invoice.  ° °IF you received labwork today, you will receive an invoice from [facility]. Please contact [facility] at [phone] with questions or concerns regarding your invoice.  ° °Our billing staff will not be able to assist you with questions regarding bills from these companies. ° °You will be contacted with the lab results as soon as they are available. The fastest way to get your results is to activate your My Chart account. Instructions are located on the last page of this paperwork. If you have not heard from us regarding the results in 2 weeks, please contact this office. °  ° ° ° °

## 2019-03-25 DIAGNOSIS — D239 Other benign neoplasm of skin, unspecified: Secondary | ICD-10-CM | POA: Diagnosis not present

## 2019-03-28 ENCOUNTER — Other Ambulatory Visit: Payer: Self-pay | Admitting: Cardiovascular Disease

## 2019-03-29 DIAGNOSIS — D497 Neoplasm of unspecified behavior of endocrine glands and other parts of nervous system: Secondary | ICD-10-CM | POA: Diagnosis not present

## 2019-03-29 DIAGNOSIS — Z8679 Personal history of other diseases of the circulatory system: Secondary | ICD-10-CM | POA: Diagnosis not present

## 2019-04-01 ENCOUNTER — Other Ambulatory Visit: Payer: Self-pay

## 2019-04-01 ENCOUNTER — Ambulatory Visit (INDEPENDENT_AMBULATORY_CARE_PROVIDER_SITE_OTHER): Payer: BC Managed Care – PPO | Admitting: Family Medicine

## 2019-04-01 DIAGNOSIS — E291 Testicular hypofunction: Secondary | ICD-10-CM

## 2019-04-01 NOTE — Progress Notes (Signed)
Testosterone shot given Right Ventrogluteal Tolerated well

## 2019-04-15 ENCOUNTER — Ambulatory Visit (INDEPENDENT_AMBULATORY_CARE_PROVIDER_SITE_OTHER): Payer: BC Managed Care – PPO | Admitting: Family Medicine

## 2019-04-15 ENCOUNTER — Other Ambulatory Visit: Payer: Self-pay

## 2019-04-15 DIAGNOSIS — E291 Testicular hypofunction: Secondary | ICD-10-CM

## 2019-04-22 ENCOUNTER — Ambulatory Visit (INDEPENDENT_AMBULATORY_CARE_PROVIDER_SITE_OTHER): Payer: BC Managed Care – PPO | Admitting: Pharmacist Clinician (PhC)/ Clinical Pharmacy Specialist

## 2019-04-22 ENCOUNTER — Other Ambulatory Visit: Payer: Self-pay

## 2019-04-22 DIAGNOSIS — Z952 Presence of prosthetic heart valve: Secondary | ICD-10-CM | POA: Diagnosis not present

## 2019-04-22 DIAGNOSIS — Z7901 Long term (current) use of anticoagulants: Secondary | ICD-10-CM

## 2019-04-22 DIAGNOSIS — Z5181 Encounter for therapeutic drug level monitoring: Secondary | ICD-10-CM

## 2019-04-22 LAB — POCT INR: INR: 1.5 — AB (ref 2.0–3.0)

## 2019-04-26 DIAGNOSIS — E349 Endocrine disorder, unspecified: Secondary | ICD-10-CM | POA: Diagnosis not present

## 2019-04-26 DIAGNOSIS — Z79899 Other long term (current) drug therapy: Secondary | ICD-10-CM | POA: Diagnosis not present

## 2019-04-26 DIAGNOSIS — E220 Acromegaly and pituitary gigantism: Secondary | ICD-10-CM | POA: Diagnosis not present

## 2019-04-26 DIAGNOSIS — E291 Testicular hypofunction: Secondary | ICD-10-CM | POA: Diagnosis not present

## 2019-04-26 DIAGNOSIS — E890 Postprocedural hypothyroidism: Secondary | ICD-10-CM | POA: Diagnosis not present

## 2019-04-26 DIAGNOSIS — E2740 Unspecified adrenocortical insufficiency: Secondary | ICD-10-CM | POA: Diagnosis not present

## 2019-04-29 ENCOUNTER — Other Ambulatory Visit: Payer: Self-pay

## 2019-04-29 ENCOUNTER — Ambulatory Visit (INDEPENDENT_AMBULATORY_CARE_PROVIDER_SITE_OTHER): Payer: BC Managed Care – PPO | Admitting: Family Medicine

## 2019-04-29 DIAGNOSIS — E291 Testicular hypofunction: Secondary | ICD-10-CM

## 2019-04-29 DIAGNOSIS — E039 Hypothyroidism, unspecified: Secondary | ICD-10-CM

## 2019-05-02 ENCOUNTER — Other Ambulatory Visit: Payer: Self-pay | Admitting: Cardiovascular Disease

## 2019-05-12 ENCOUNTER — Ambulatory Visit: Payer: BC Managed Care – PPO

## 2019-05-13 ENCOUNTER — Ambulatory Visit (INDEPENDENT_AMBULATORY_CARE_PROVIDER_SITE_OTHER): Payer: BC Managed Care – PPO | Admitting: Family Medicine

## 2019-05-13 ENCOUNTER — Other Ambulatory Visit: Payer: Self-pay

## 2019-05-13 DIAGNOSIS — E291 Testicular hypofunction: Secondary | ICD-10-CM

## 2019-05-13 NOTE — Patient Instructions (Signed)
° ° ° °  If you have lab work done today you will be contacted with your lab results within the next 2 weeks.  If you have not heard from us then please contact us. The fastest way to get your results is to register for My Chart. ° ° °IF you received an x-ray today, you will receive an invoice from [facility]. Please contact [facility] at [phone] with questions or concerns regarding your invoice.  ° °IF you received labwork today, you will receive an invoice from [facility]. Please contact [facility] at [phone] with questions or concerns regarding your invoice.  ° °Our billing staff will not be able to assist you with questions regarding bills from these companies. ° °You will be contacted with the lab results as soon as they are available. The fastest way to get your results is to activate your My Chart account. Instructions are located on the last page of this paperwork. If you have not heard from us regarding the results in 2 weeks, please contact this office. °  ° ° ° °

## 2019-05-27 ENCOUNTER — Ambulatory Visit (INDEPENDENT_AMBULATORY_CARE_PROVIDER_SITE_OTHER): Payer: BC Managed Care – PPO | Admitting: Family Medicine

## 2019-05-27 ENCOUNTER — Other Ambulatory Visit: Payer: Self-pay

## 2019-05-27 DIAGNOSIS — E291 Testicular hypofunction: Secondary | ICD-10-CM

## 2019-05-27 NOTE — Patient Instructions (Signed)
° ° ° °  If you have lab work done today you will be contacted with your lab results within the next 2 weeks.  If you have not heard from us then please contact us. The fastest way to get your results is to register for My Chart. ° ° °IF you received an x-ray today, you will receive an invoice from [facility]. Please contact [facility] at [phone] with questions or concerns regarding your invoice.  ° °IF you received labwork today, you will receive an invoice from [facility]. Please contact [facility] at [phone] with questions or concerns regarding your invoice.  ° °Our billing staff will not be able to assist you with questions regarding bills from these companies. ° °You will be contacted with the lab results as soon as they are available. The fastest way to get your results is to activate your My Chart account. Instructions are located on the last page of this paperwork. If you have not heard from us regarding the results in 2 weeks, please contact this office. °  ° ° ° °

## 2019-05-27 NOTE — Progress Notes (Signed)
Pt tolerated well. Med was given in right ventral gluteal.

## 2019-05-30 ENCOUNTER — Other Ambulatory Visit: Payer: Self-pay

## 2019-05-30 ENCOUNTER — Telehealth: Payer: Self-pay | Admitting: Family Medicine

## 2019-05-30 ENCOUNTER — Other Ambulatory Visit: Payer: Self-pay | Admitting: Cardiovascular Disease

## 2019-05-30 DIAGNOSIS — Z1211 Encounter for screening for malignant neoplasm of colon: Secondary | ICD-10-CM

## 2019-05-30 NOTE — Telephone Encounter (Signed)
Pt would like a referral for a colonoscopy. He would prefer not to have to come into the office. Please advise at [phone].

## 2019-05-30 NOTE — Telephone Encounter (Signed)
*  STAT* If patient is at the pharmacy, call can be transferred to refill team.   1. Which medications need to be refilled? (please list name of each medication and dose if known) warfarin (COUMADIN) 5 MG tablet  2. Which pharmacy/location (including street and city if local pharmacy) is medication to be sent to? [place], [place]  3. Do they need a 30 day or 90 day supply? [place]

## 2019-05-31 MED ORDER — WARFARIN SODIUM 5 MG PO TABS: ORAL_TABLET | ORAL | 0 refills | Status: DC

## 2019-05-31 NOTE — Telephone Encounter (Signed)
I do not mind referring him without an office visit if this is for screening colonoscopy as he will be turning 49 in June.  If he is having any symptoms or any specific concerns, and then should be evaluated.  Please obtain more information.

## 2019-05-31 NOTE — Telephone Encounter (Signed)
It is just because he is turning 49 no concerns

## 2019-05-31 NOTE — Telephone Encounter (Signed)
Pt requesting a referral for Colonoscopy, last visit was 05/27/2019 with you.

## 2019-06-01 ENCOUNTER — Encounter: Payer: Self-pay | Admitting: Gastroenterology

## 2019-06-03 ENCOUNTER — Ambulatory Visit (INDEPENDENT_AMBULATORY_CARE_PROVIDER_SITE_OTHER): Payer: BC Managed Care – PPO | Admitting: Pharmacist Clinician (PhC)/ Clinical Pharmacy Specialist

## 2019-06-03 ENCOUNTER — Other Ambulatory Visit: Payer: Self-pay

## 2019-06-03 DIAGNOSIS — Z5181 Encounter for therapeutic drug level monitoring: Secondary | ICD-10-CM | POA: Diagnosis not present

## 2019-06-03 DIAGNOSIS — Z7901 Long term (current) use of anticoagulants: Secondary | ICD-10-CM | POA: Diagnosis not present

## 2019-06-03 DIAGNOSIS — Z952 Presence of prosthetic heart valve: Secondary | ICD-10-CM | POA: Diagnosis not present

## 2019-06-03 LAB — POCT INR: INR: 1.8 — AB (ref 2.0–3.0)

## 2019-06-03 MED ORDER — WARFARIN SODIUM 5 MG PO TABS: ORAL_TABLET | ORAL | 0 refills | Status: DC

## 2019-06-10 ENCOUNTER — Other Ambulatory Visit: Payer: Self-pay

## 2019-06-10 ENCOUNTER — Ambulatory Visit (INDEPENDENT_AMBULATORY_CARE_PROVIDER_SITE_OTHER): Payer: BC Managed Care – PPO | Admitting: Family Medicine

## 2019-06-10 DIAGNOSIS — E291 Testicular hypofunction: Secondary | ICD-10-CM

## 2019-06-10 NOTE — Progress Notes (Signed)
Left Ventalgultal Pt tolerated well.

## 2019-06-21 DIAGNOSIS — E349 Endocrine disorder, unspecified: Secondary | ICD-10-CM | POA: Diagnosis not present

## 2019-06-21 DIAGNOSIS — E890 Postprocedural hypothyroidism: Secondary | ICD-10-CM | POA: Diagnosis not present

## 2019-06-21 DIAGNOSIS — E220 Acromegaly and pituitary gigantism: Secondary | ICD-10-CM | POA: Diagnosis not present

## 2019-06-21 DIAGNOSIS — E2740 Unspecified adrenocortical insufficiency: Secondary | ICD-10-CM | POA: Diagnosis not present

## 2019-06-23 ENCOUNTER — Ambulatory Visit (INDEPENDENT_AMBULATORY_CARE_PROVIDER_SITE_OTHER): Payer: BC Managed Care – PPO | Admitting: Family Medicine

## 2019-06-23 ENCOUNTER — Other Ambulatory Visit: Payer: Self-pay

## 2019-06-23 DIAGNOSIS — E291 Testicular hypofunction: Secondary | ICD-10-CM | POA: Diagnosis not present

## 2019-07-08 ENCOUNTER — Other Ambulatory Visit: Payer: Self-pay

## 2019-07-08 ENCOUNTER — Ambulatory Visit (INDEPENDENT_AMBULATORY_CARE_PROVIDER_SITE_OTHER): Payer: BC Managed Care – PPO | Admitting: Registered Nurse

## 2019-07-08 DIAGNOSIS — E291 Testicular hypofunction: Secondary | ICD-10-CM

## 2019-07-08 NOTE — Progress Notes (Signed)
Right upper Outer Quad.  Pt tolerated well.

## 2019-07-11 DIAGNOSIS — G5631 Lesion of radial nerve, right upper limb: Secondary | ICD-10-CM | POA: Diagnosis not present

## 2019-07-15 ENCOUNTER — Ambulatory Visit (INDEPENDENT_AMBULATORY_CARE_PROVIDER_SITE_OTHER): Payer: BC Managed Care – PPO | Admitting: Pharmacist Clinician (PhC)/ Clinical Pharmacy Specialist

## 2019-07-15 ENCOUNTER — Other Ambulatory Visit: Payer: Self-pay

## 2019-07-15 DIAGNOSIS — Z952 Presence of prosthetic heart valve: Secondary | ICD-10-CM | POA: Diagnosis not present

## 2019-07-15 DIAGNOSIS — Z5181 Encounter for therapeutic drug level monitoring: Secondary | ICD-10-CM

## 2019-07-15 DIAGNOSIS — Z7901 Long term (current) use of anticoagulants: Secondary | ICD-10-CM | POA: Diagnosis not present

## 2019-07-15 LAB — POCT INR: INR: 1.5 — AB (ref 2.0–3.0)

## 2019-07-22 ENCOUNTER — Ambulatory Visit (INDEPENDENT_AMBULATORY_CARE_PROVIDER_SITE_OTHER): Payer: BC Managed Care – PPO | Admitting: Registered Nurse

## 2019-07-22 ENCOUNTER — Other Ambulatory Visit: Payer: Self-pay

## 2019-07-22 DIAGNOSIS — E291 Testicular hypofunction: Secondary | ICD-10-CM

## 2019-07-22 NOTE — Progress Notes (Signed)
Pt tolerated well. RUOQ and had 100mg  of it.   SPZ:98022-179-81 Lot: [id number] EXP:5/23

## 2019-08-04 ENCOUNTER — Ambulatory Visit (INDEPENDENT_AMBULATORY_CARE_PROVIDER_SITE_OTHER): Payer: BC Managed Care – PPO | Admitting: Family Medicine

## 2019-08-04 ENCOUNTER — Other Ambulatory Visit: Payer: Self-pay

## 2019-08-04 DIAGNOSIS — E291 Testicular hypofunction: Secondary | ICD-10-CM

## 2019-08-04 NOTE — Progress Notes (Signed)
Pt here for testosterone injection, 0.5 mg injection given on Lt side

## 2019-08-05 ENCOUNTER — Ambulatory Visit: Payer: BC Managed Care – PPO

## 2019-08-05 DIAGNOSIS — I7789 Other specified disorders of arteries and arterioles: Secondary | ICD-10-CM | POA: Diagnosis not present

## 2019-08-05 DIAGNOSIS — I428 Other cardiomyopathies: Secondary | ICD-10-CM | POA: Diagnosis not present

## 2019-08-05 DIAGNOSIS — Z952 Presence of prosthetic heart valve: Secondary | ICD-10-CM | POA: Diagnosis not present

## 2019-08-05 DIAGNOSIS — Z48812 Encounter for surgical aftercare following surgery on the circulatory system: Secondary | ICD-10-CM | POA: Diagnosis not present

## 2019-08-05 DIAGNOSIS — E220 Acromegaly and pituitary gigantism: Secondary | ICD-10-CM | POA: Diagnosis not present

## 2019-08-05 DIAGNOSIS — I371 Nonrheumatic pulmonary valve insufficiency: Secondary | ICD-10-CM | POA: Diagnosis not present

## 2019-08-05 DIAGNOSIS — Z9889 Other specified postprocedural states: Secondary | ICD-10-CM | POA: Diagnosis not present

## 2019-08-05 DIAGNOSIS — I719 Aortic aneurysm of unspecified site, without rupture: Secondary | ICD-10-CM | POA: Diagnosis not present

## 2019-08-05 DIAGNOSIS — Z8679 Personal history of other diseases of the circulatory system: Secondary | ICD-10-CM | POA: Diagnosis not present

## 2019-08-05 DIAGNOSIS — I517 Cardiomegaly: Secondary | ICD-10-CM | POA: Diagnosis not present

## 2019-08-10 ENCOUNTER — Encounter: Payer: BC Managed Care – PPO | Admitting: Gastroenterology

## 2019-08-18 ENCOUNTER — Ambulatory Visit: Payer: BC Managed Care – PPO | Admitting: Physician Assistant

## 2019-08-18 ENCOUNTER — Other Ambulatory Visit: Payer: Self-pay

## 2019-08-18 ENCOUNTER — Encounter: Payer: Self-pay | Admitting: Physician Assistant

## 2019-08-18 DIAGNOSIS — Z1283 Encounter for screening for malignant neoplasm of skin: Secondary | ICD-10-CM | POA: Diagnosis not present

## 2019-08-18 DIAGNOSIS — Z86018 Personal history of other benign neoplasm: Secondary | ICD-10-CM | POA: Diagnosis not present

## 2019-08-18 DIAGNOSIS — D225 Melanocytic nevi of trunk: Secondary | ICD-10-CM | POA: Diagnosis not present

## 2019-08-18 DIAGNOSIS — Z87898 Personal history of other specified conditions: Secondary | ICD-10-CM

## 2019-08-18 DIAGNOSIS — D229 Melanocytic nevi, unspecified: Secondary | ICD-10-CM

## 2019-08-18 NOTE — Progress Notes (Addendum)
   Follow up Visit  Subjective  Mark Gallagher is a 49 y.o. male who presents for the following: Annual Exam (concerns right ankle patient wants looked at. ). Spot above ankle that he recently noticed. History of atypical moles  Objective  Well appearing patient in no apparent distress; mood and affect are within normal limits.  A full examination was performed including head, eyes, ears, nose, lips, neck, chest, axillae, abdomen, back, buttocks, bilateral upper extremities, bilateral lower extremities, hands, fingers fingernails. All findings within normal limits unless otherwise noted below. No suspicious moles noted on back.   Objective  Right Anterior Neck: Severe atypia.Dyspigmented scar.  5 other atypical moles since 2016.  Objective  Neck - Anterior: Total body skin exam  Objective  Left Lower Back, Right Lower Back: Tan-brown symmetric macules and papules.   Assessment & Plan  History of atypical nevus Right Anterior Neck  Continue yearly skin checks.  Skin exam for malignant neoplasm Neck - Anterior  Nevus (2) Left Lower Back; Right Lower Back  No treatment needed.

## 2019-08-19 ENCOUNTER — Ambulatory Visit (INDEPENDENT_AMBULATORY_CARE_PROVIDER_SITE_OTHER): Payer: BC Managed Care – PPO | Admitting: Family Medicine

## 2019-08-19 ENCOUNTER — Other Ambulatory Visit: Payer: Self-pay

## 2019-08-19 DIAGNOSIS — E291 Testicular hypofunction: Secondary | ICD-10-CM | POA: Diagnosis not present

## 2019-08-19 MED ORDER — TESTOSTERONE CYPIONATE 200 MG/ML IM SOLN
200.0000 mg | INTRAMUSCULAR | Status: DC
  Administered 2019-09-02 – 2019-10-19 (×4): 200 mg via INTRAMUSCULAR

## 2019-08-24 DIAGNOSIS — M4712 Other spondylosis with myelopathy, cervical region: Secondary | ICD-10-CM | POA: Diagnosis not present

## 2019-08-24 DIAGNOSIS — M542 Cervicalgia: Secondary | ICD-10-CM | POA: Diagnosis not present

## 2019-08-24 DIAGNOSIS — M4802 Spinal stenosis, cervical region: Secondary | ICD-10-CM | POA: Diagnosis not present

## 2019-08-26 ENCOUNTER — Ambulatory Visit (INDEPENDENT_AMBULATORY_CARE_PROVIDER_SITE_OTHER): Payer: BC Managed Care – PPO

## 2019-08-26 ENCOUNTER — Other Ambulatory Visit: Payer: Self-pay

## 2019-08-26 DIAGNOSIS — Z5181 Encounter for therapeutic drug level monitoring: Secondary | ICD-10-CM

## 2019-08-26 DIAGNOSIS — Z952 Presence of prosthetic heart valve: Secondary | ICD-10-CM | POA: Diagnosis not present

## 2019-08-26 DIAGNOSIS — Z7901 Long term (current) use of anticoagulants: Secondary | ICD-10-CM

## 2019-08-26 LAB — POCT INR: INR: 1.4 — AB (ref 2.0–3.0)

## 2019-08-26 NOTE — Patient Instructions (Signed)
Take 3 tablets today and then continue taking  3 tablets (15 mg) daily except 2 tablets (10 mg) each Monday, Wednesday and Friday.  Repeat INR in 4 weeks. (NEW GOAL 1.5 to 2 - On-X valve)

## 2019-09-02 ENCOUNTER — Ambulatory Visit (INDEPENDENT_AMBULATORY_CARE_PROVIDER_SITE_OTHER): Payer: BC Managed Care – PPO | Admitting: Family Medicine

## 2019-09-02 ENCOUNTER — Ambulatory Visit: Payer: BC Managed Care – PPO

## 2019-09-02 ENCOUNTER — Encounter: Payer: Self-pay | Admitting: Family Medicine

## 2019-09-02 VITALS — BP 121/78 | HR 74 | Temp 97.6°F | Ht 78.0 in | Wt 247.6 lb

## 2019-09-02 DIAGNOSIS — E291 Testicular hypofunction: Secondary | ICD-10-CM

## 2019-09-02 DIAGNOSIS — N62 Hypertrophy of breast: Secondary | ICD-10-CM | POA: Diagnosis not present

## 2019-09-02 NOTE — Progress Notes (Signed)
Patient ID: Mark Gallagher, male    DOB: [birth year]  Age: 49 y.o. MRN: [id number]  Chief Complaint  Patient presents with  . Breast Mass    Pt stated that he has been feeling some lumps in both breast for the past week    Subjective:  Patient has a history of developing some lumps in both breasts over the last several weeks.  He has not noticed this before.  It is nonpainful.  He does not have any galactorrhea.  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  He does have an endocrinologist at [facility].  Current allergies, medications, problem list, past/family and social histories reviewed.  Objective:  BP 121/78 (BP Location: Right Arm, Patient Position: Sitting, Cuff Size: Normal)   Pulse 74   Temp 97.6 F (36.4 C) (Temporal)   Ht 6\' 6"  (1.981 m)   Wt 247 lb 9.6 oz (112.3 kg)   SpO2 95%   BMI 28.61 kg/m   No axillary nodes.  Bilateral gynecomastia about 4 to 5 cm in diameter and 1 cm or so thick.  Nontender.  No galactorrhea.  Assessment & Plan:   Assessment: 1. Hypogonadism, male   2. Gynecomastia       Plan: We will probably refer him back to his endocrinologist but will check a couple of labs first.  Discussed briefly with Dr. Carlota Raspberry who is his primary care doctor.  Orders Placed This Encounter  Procedures  . Comprehensive metabolic panel  . TSH  . Prolactin    No orders of the defined types were placed in this encounter.        Patient Instructions    Labs are being checked.  After the labs are available referral will probably be made back to your endocrinologist at [facility].  Return if problems.   If you have lab work done today you will be contacted with your lab results within the next 2 weeks.  If you have not heard from [place] then please contact us. The fastest way to get your results is to register for My Chart.   IF you received an x-ray today, you will receive an invoice from [facility]. Please  contact [facility] at [phone] with questions or concerns regarding your invoice.   IF you received labwork today, you will receive an invoice from [place]. Please contact [facility] at [phone] with questions or concerns regarding your invoice.   Our billing staff will not be able to assist you with questions regarding bills from these companies.  You will be contacted with the lab results as soon as they are available. The fastest way to get your results is to activate your My Chart account. Instructions are located on the last page of this paperwork. If you have not heard from [place] regarding the results in 2 weeks, please contact this office.        Return if symptoms worsen or fail to improve.   Ruben Reason, MD 09/02/2019

## 2019-09-02 NOTE — Patient Instructions (Addendum)
  Labs are being checked.  After the labs are available referral will probably be made back to your endocrinologist at [facility].  Return if problems.   If you have lab work done today you will be contacted with your lab results within the next 2 weeks.  If you have not heard from [place] then please contact us. The fastest way to get your results is to register for My Chart.   IF you received an x-ray today, you will receive an invoice from [facility]. Please contact [facility] at [phone] with questions or concerns regarding your invoice.   IF you received labwork today, you will receive an invoice from [facility]. Please contact [facility] at [phone] with questions or concerns regarding your invoice.   Our billing staff will not be able to assist you with questions regarding bills from these companies.  You will be contacted with the lab results as soon as they are available. The fastest way to get your results is to activate your My Chart account. Instructions are located on the last page of this paperwork. If you have not heard from [place] regarding the results in 2 weeks, please contact this office.

## 2019-09-03 LAB — COMPREHENSIVE METABOLIC PANEL
ALT: 21 IU/L (ref 0–44)
AST: 13 IU/L (ref 0–40)
Albumin/Globulin Ratio: 2.2 (ref 1.2–2.2)
Albumin: 4.7 g/dL (ref 4.0–5.0)
Alkaline Phosphatase: 93 IU/L (ref 48–121)
BUN/Creatinine Ratio: 22 — ABNORMAL HIGH (ref 9–20)
BUN: 15 mg/dL (ref 6–24)
Bilirubin Total: 1 mg/dL (ref 0.0–1.2)
CO2: 24 mmol/L (ref 20–29)
Calcium: 9.9 mg/dL (ref 8.7–10.2)
Chloride: 103 mmol/L (ref 96–106)
Creatinine, Ser: 0.67 mg/dL — ABNORMAL LOW (ref 0.76–1.27)
GFR calc Af Amer: 130 mL/min/{1.73_m2} (ref >59)
GFR calc non Af Amer: 112 mL/min/{1.73_m2} (ref >59)
Globulin, Total: 2.1 g/dL (ref 1.5–4.5)
Glucose: 84 mg/dL (ref 65–99)
Potassium: 4.3 mmol/L (ref 3.5–5.2)
Sodium: 140 mmol/L (ref 134–144)
Total Protein: 6.8 g/dL (ref 6.0–8.5)

## 2019-09-03 LAB — TSH: TSH: 0.066 u[IU]/mL — ABNORMAL LOW (ref 0.450–4.500)

## 2019-09-03 LAB — PROLACTIN: Prolactin: 27 ng/mL — ABNORMAL HIGH (ref 4.0–15.2)

## 2019-09-06 ENCOUNTER — Other Ambulatory Visit: Payer: Self-pay

## 2019-09-07 ENCOUNTER — Ambulatory Visit: Payer: BC Managed Care – PPO | Admitting: Gastroenterology

## 2019-09-07 ENCOUNTER — Telehealth: Payer: Self-pay | Admitting: Gastroenterology

## 2019-09-07 ENCOUNTER — Encounter: Payer: Self-pay | Admitting: Gastroenterology

## 2019-09-07 VITALS — BP 92/70 | HR 80 | Ht 78.0 in | Wt 249.0 lb

## 2019-09-07 DIAGNOSIS — E220 Acromegaly and pituitary gigantism: Secondary | ICD-10-CM

## 2019-09-07 DIAGNOSIS — Z1212 Encounter for screening for malignant neoplasm of rectum: Secondary | ICD-10-CM

## 2019-09-07 DIAGNOSIS — Z1211 Encounter for screening for malignant neoplasm of colon: Secondary | ICD-10-CM | POA: Diagnosis not present

## 2019-09-07 DIAGNOSIS — Z7901 Long term (current) use of anticoagulants: Secondary | ICD-10-CM

## 2019-09-07 NOTE — Progress Notes (Signed)
History of Present Illness: This is a 49 year old male referred by Mark Agreste, MD for the evaluation of CRC screening.  He is accompanied by his mother.  He has acromegaly.  Anemia was noted in 2015.  Hemoccult negative stool was documented in 2016 and 2018.  He relates treatment with iron since 2015 however the cause of his iron deficiency is not clear.  He states he has had constipation since beginning iron which has been well controlled with MiraLAX and stool softeners.  He underwent aortic valve replacement and aortic root aneurysm repair at [facility] in September 2020.  He has been maintained on Coumadin since that time.  He apparently has had an remarkable postoperative course.  He was seen in follow-up by thoracic surgery on July 16.  He follows with multiple specialists including orthopedics, cardiology, cardiovascular surgery, endocrinology and family medicine.  No family history of colon polyps, colon cancer. Denies weight loss, abdominal pain, diarrhea, change in stool caliber, melena, hematochezia, nausea, vomiting, dysphagia, reflux symptoms, chest pain.    Allergies  Allergen Reactions  . Oxycodone Nausea Only   Outpatient Medications Prior to Visit  Medication Sig Dispense Refill  . atorvastatin (LIPITOR) 40 MG tablet Take 40 mg by mouth daily.    . Cholecalciferol (VITAMIN D3) 25 MCG (1000 UT) CAPS Take 2,000 Units by mouth daily.     Marland Kitchen docusate sodium (COLACE) 250 MG capsule Take 250 mg by mouth daily.    Marland Kitchen eplerenone (INSPRA) 50 MG tablet Take 50 mg by mouth daily.    . ferrous sulfate 325 (65 FE) MG tablet Take 1 tablet (325 mg total) by mouth 2 (two) times daily with a meal. 60 tablet 1  . hydrocortisone (CORTEF) 10 MG tablet Take 1 tablet daily.  You may take 2 tablets daily with excessive weakness and tiredness and lethargy. (Patient taking differently: Take 10 mg by mouth daily. ) 60 tablet 0  . levothyroxine (SYNTHROID, LEVOTHROID) 200 MCG tablet Take 1 tablet (200 mcg  total) by mouth daily.    . metoprolol succinate (TOPROL-XL) 25 MG 24 hr tablet Take 25 mg by mouth daily.    . Multiple Vitamins-Minerals (CENTRUM SILVER PO) Take 1 tablet by mouth daily.    . Pegvisomant 10 MG SOLR Give 40 mg (4 injections) on day one. Everyday afterwards, give 10mg  once daily.    . polyethylene glycol (MIRALAX / GLYCOLAX) packet Take 17 g by mouth daily as needed for mild constipation.    Marland Kitchen warfarin (COUMADIN) 5 MG tablet Take 2 to 3 tablets daily as directed by coumadin clinic 270 tablet 0   Facility-Administered Medications Prior to Visit  Medication Dose Route Frequency Provider Last Rate Last Admin  . octreotide (SANDOSTATIN LAR) IM injection 30 mg  30 mg Intramuscular Q28 days Mark Agreste, MD   30 mg at 03/13/17 1609  . testosterone cypionate (DEPOTESTOSTERONE CYPIONATE) injection 100 mg  100 mg Intramuscular Q14 Days Mark Agreste, MD   100 mg at 12/17/18 1115  . testosterone cypionate (DEPOTESTOSTERONE CYPIONATE) injection 100 mg  100 mg Intramuscular Q14 Days Mark Agreste, MD   100 mg at 08/04/19 1049  . testosterone cypionate (DEPOTESTOSTERONE CYPIONATE) injection 200 mg  200 mg Intramuscular Q14 Days Rutherford Guys, MD   200 mg at 08/19/19 1118  . testosterone cypionate (DEPOTESTOSTERONE CYPIONATE) injection 200 mg  200 mg Intramuscular Q14 Days Mark Agreste, MD   200 mg at 09/02/19 1202   Past Medical  History:  Diagnosis Date  . Acromegaly ([place])   . Arthritis   . Atypical nevus 07/13/2014   moderate atypia - left post knee - widershave (free)  . Atypical nevus 08/17/2014   moderate atypia - right mid back   . Atypical nevus 12/20/2015   moderate to severe atypia - upper mid back - widershave  . Atypical nevus 09/18/2017   mild atypia - left anterior back  . Atypical nevus 09/18/2017   moderate atypia - left posterior back  . Atypical nevus 07/16/2018   marked atypical - right neck, lower  . Cancer ([place])   . History of pituitary  tumor   . Hypertension   . Hypothyroidism   . Knee pain, bilateral   . Pneumonia   . Testosterone deficiency   . Thyroid disease    Past Surgical History:  Procedure Laterality Date  . CARDIAC ELECTROPHYSIOLOGY STUDY AND ABLATION    . CERVICAL DISC SURGERY  2007   Anterior C4/5 vertebrectomy and c3-C6 laminoplasty with plating  . CRANIOTOMY FOR TUMOR     pituitary adenoma  . TOTAL HIP ARTHROPLASTY Right 03/16/2018   Procedure: TOTAL HIP ARTHROPLASTY Anterior;  Surgeon: Renette Butters, MD;  Location: [facility];  Service: Orthopedics;  Laterality: Right;   Social History   Socioeconomic History  . Marital status: Single    Spouse name: Not on file  . Number of children: Not on file  . Years of education: Not on file  . Highest education level: Not on file  Occupational History  . Not on file  Tobacco Use  . Smoking status: Never Smoker  . Smokeless tobacco: Never Used  Vaping Use  . Vaping Use: Never used  Substance and Sexual Activity  . Alcohol use: No  . Drug use: No  . Sexual activity: Not on file  Other Topics Concern  . Not on file  Social History Narrative  . Not on file   Social Determinants of Health   Financial Resource Strain:   . Difficulty of Paying Living Expenses:   Food Insecurity:   . Worried About Charity fundraiser in the Last Year:   . Arboriculturist in the Last Year:   Transportation Needs:   . Film/video editor (Medical):   Marland Kitchen Lack of Transportation (Non-Medical):   Physical Activity:   . Days of Exercise per Week:   . Minutes of Exercise per Session:   Stress:   . Feeling of Stress :   Social Connections:   . Frequency of Communication with Friends and Family:   . Frequency of Social Gatherings with Friends and Family:   . Attends Religious Services:   . Active Member of Clubs or Organizations:   . Attends Archivist Meetings:   Marland Kitchen Marital Status:    Family History  Problem Relation Age of Onset  . High blood  pressure Mother   . Acute myelogenous leukemia Brother      Review of Systems: Pertinent positive and negative review of systems were noted in the above HPI section. All other review of systems were otherwise negative.   Physical Exam: General: Well developed, well nourished, acromegalic features, no acute distress Head: Normocephalic and atraumatic Eyes:  sclerae anicteric, EOMI Ears: Normal auditory acuity Mouth: Not examined, mask on during Covid-19 pandemic Neck: Supple, no masses or thyromegaly Lungs: Clear throughout to auscultation Heart: Regular rate and rhythm; no murmurs, rubs or bruits Abdomen: Soft, non tender and non distended. No  masses, hepatosplenomegaly or hernias noted. Normal Bowel sounds Rectal: Not done Musculoskeletal: Symmetrical with no gross deformities  Skin: No lesions on visible extremities Pulses:  Normal pulses noted Extremities: No clubbing, cyanosis, edema or deformities noted Neurological: Alert oriented x 4, grossly nonfocal Cervical Nodes:  No significant cervical adenopathy Inguinal Nodes: No significant inguinal adenopathy Psychological:  Alert and cooperative. Normal mood and affect   Assessment and Recommendations:  1. CRC screening, average risk.  Given his need for anticoagulation with an aortic valve replacement in September 2020 we discussed Cologuard and colonoscopy as options.  After a thorough discussion of the risks, benefits and alternatives to both options he prefers to proceed with colonoscopy.  Prior to scheduling we need clearance from his cardiac surgeon or cardiologist for colonoscopy and for periprocedure anticoagulation management. The risks (including bleeding, perforation, infection, missed lesions, medication reactions and possible hospitalization or surgery if complications occur), benefits, and alternatives to colonoscopy with possible biopsy and possible polypectomy were discussed with the patient and they consent to proceed.   No need for prophylactic antibiotics for colonoscopy.  2. Constipation.  Continue MiraLAX daily and Colace daily.  3. IDA.  Etiology unclear.  4. 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  He is maintained on Coumadin anticoagulation.  We will request clearance to proceed with colonoscopy from his cardiovascular surgeon or cardiologist and if cleared will ask them to manage his periprocedure anticoagulation regimen.  Hold Coumadin 5 days before procedure - will instruct when and how to resume after procedure. Low but real risk of cardiovascular event such as heart attack, stroke, embolism, thrombosis or ischemia/infarct of other organs off Coumadin explained and need to seek urgent help if this occurs. The patient consents to proceed. Will communicate by phone or EMR with patient's prescribing provider to confirm that holding Coumadin is reasonable in this case.     cc: Mark Agreste, MD [address] [place],   [address]

## 2019-09-07 NOTE — Telephone Encounter (Signed)
Faxed letter to [facility] Joretta Bachelor, NP) to ask for permission for patient to medically have Colonoscopy and anticoagulant clearance.

## 2019-09-07 NOTE — Telephone Encounter (Signed)
Pt states that he spoke with his cardiologist (Dr. Ysidro Evert) at [facility] who told pt that he can have procedure. Pt would like to schedule procedure as soon as possible and stated that Dr. Fuller Plan needed this information.

## 2019-09-07 NOTE — Patient Instructions (Addendum)
Please contact your surgeons office to get medical clearance and anticoagulant clearance for a Colonoscopy. We will not schedule at this time until they have given you clearance. We will forward this office note to their office. Please contact us back after you have discussed this with Melissa Burkett's office.  Thank you for choosing me and [facility].  Pricilla Riffle. Dagoberto Ligas., MD., Marval Regal

## 2019-09-08 ENCOUNTER — Telehealth: Payer: Self-pay | Admitting: Cardiovascular Disease

## 2019-09-08 DIAGNOSIS — E220 Acromegaly and pituitary gigantism: Secondary | ICD-10-CM | POA: Diagnosis not present

## 2019-09-08 NOTE — Progress Notes (Signed)
Call patient:His labs show his prolactin level to be high, which means his pituitary gland is probably causing his breast to enlarge.  His thyroid hormone level also is overactive.  It is important that he see his endocrinologist back.  Patient should contact the endocrinologist office.  If he needs a referral let us know.Mark Gallagher. Linna Darner, MD

## 2019-09-08 NOTE — Telephone Encounter (Signed)
Leave message for pt to call back to schedule an appointment

## 2019-09-08 NOTE — Telephone Encounter (Signed)
Patient is returning [name]'s call. Advised the patient that Mark Gallagher will call him back.

## 2019-09-08 NOTE — Telephone Encounter (Signed)
   Primary Cardiologist:No primary care provider on file.  Chart reviewed as part of pre-operative protocol coverage. Patient has not been seen in the last 6 months. Therefore, pre protocol,  he will require a follow-up visit in order to better assess preoperative cardiovascular risk.  Pre-op covering staff: - Please schedule appointment and call patient to inform them. If patient already had an upcoming appointment within acceptable timeframe, please add "pre-op clearance" to the appointment notes so provider is aware. - Please contact requesting surgeon's office via preferred method (i.e, phone, fax) to inform them of need for appointment prior to surgery.   Darreld Mclean, PA-C  09/08/2019, 11:51 AM

## 2019-09-08 NOTE — Telephone Encounter (Signed)
Received fax from Joretta Bachelor, NP office stating to please contact patients coumadin clinic and that there office does not give medical clearance to have procedure.   Left message for patient to return my call to find out name of coumadin clinic at [facility].

## 2019-09-08 NOTE — Telephone Encounter (Signed)
[facility] Pre-operative Risk Assessment     Request for surgical clearance:     Endoscopy Procedure  What type of surgery is being performed?     Colonoscopy  When is this surgery scheduled?     TBD  What type of clearance is required ?   Pharmacy and Medical  Are there any medications that need to be held prior to surgery and how long? Warfarin x 5 days  Practice name and name of physician performing surgery?      [facility]  What is your office phone and fax number?      Phone- [phone]  [phone]  Anesthesia type (None, local, MAC, general) ?       MAC

## 2019-09-08 NOTE — Telephone Encounter (Signed)
Returned call to patient no answer.LMTC. 

## 2019-09-08 NOTE — Telephone Encounter (Signed)
Call and spoke with Nurse at Dr Ysidro Evert office who stated Dr Ysidro Evert doesn't give medical clearances, I will call pt and schedule appointment for him to see Dr Gwenlyn Found or one of the APP

## 2019-09-08 NOTE — Telephone Encounter (Signed)
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  Procedure: colonoscopy Date of procedure: TBD  Per office protocol, patient can hold warfarin for 5 days prior to procedure. He does NOT require bridging with Lovenox.

## 2019-09-08 NOTE — Telephone Encounter (Signed)
Pharmacy, can you please comment on how long patient can hold Coumadin for upcoming procedure?  Thank you!  

## 2019-09-08 NOTE — Telephone Encounter (Signed)
Spoke to patient he stated he already spoke to Dr.Hughe's PA yesterday and she cleared him for upcoming colonoscopy.

## 2019-09-08 NOTE — Telephone Encounter (Signed)
Patient is returning call to follow up regarding request for clearance for colonoscopy. Per phone note, I made him aware that he needs an appointment with Dr. Gloriajean Dell for preop clearance. However, he insists that an appointment will not be necessary because Dr. Ysidro Evert already cleared him for colonoscopy. I attempted to explain that Dr. Ysidro Evert does not provide medical clearance, but he still does not want to schedule at this time.

## 2019-09-08 NOTE — Telephone Encounter (Signed)
Pre-op covering staff, patient's primary Cardiologist is at [facility] (Dr. Ysidro Evert). He really just sees [place] so he can be followed in our Coumadin Clinic. Our pharmacist gave recommendations for holding Coumadin. It looks like there is a note about [facility] not providing medical clearance but then there is also a note that mentions Dr. Ysidro Evert told patient he is OK to have procedure. Can we just clarify whether patient needs medical clearance from [place]? If so, I would just get him an office visit since he has not been seen in the about 10 months.   Thank you!

## 2019-09-16 ENCOUNTER — Ambulatory Visit: Payer: BC Managed Care – PPO

## 2019-09-20 ENCOUNTER — Ambulatory Visit (INDEPENDENT_AMBULATORY_CARE_PROVIDER_SITE_OTHER): Payer: BC Managed Care – PPO | Admitting: Registered Nurse

## 2019-09-20 ENCOUNTER — Other Ambulatory Visit: Payer: Self-pay

## 2019-09-20 DIAGNOSIS — E291 Testicular hypofunction: Secondary | ICD-10-CM

## 2019-09-20 NOTE — Patient Instructions (Signed)
° ° ° °  If you have lab work done today you will be contacted with your lab results within the next 2 weeks.  If you have not heard from us then please contact us. The fastest way to get your results is to register for My Chart. ° ° °IF you received an x-ray today, you will receive an invoice from [facility]. Please contact [facility] at [phone] with questions or concerns regarding your invoice.  ° °IF you received labwork today, you will receive an invoice from [facility]. Please contact [facility] at [phone] with questions or concerns regarding your invoice.  ° °Our billing staff will not be able to assist you with questions regarding bills from these companies. ° °You will be contacted with the lab results as soon as they are available. The fastest way to get your results is to activate your My Chart account. Instructions are located on the last page of this paperwork. If you have not heard from us regarding the results in 2 weeks, please contact this office. °  ° ° ° °

## 2019-09-20 NOTE — Progress Notes (Signed)
Pt here for Biweekly testosterone injection

## 2019-09-23 ENCOUNTER — Ambulatory Visit (INDEPENDENT_AMBULATORY_CARE_PROVIDER_SITE_OTHER): Payer: BC Managed Care – PPO

## 2019-09-23 ENCOUNTER — Other Ambulatory Visit: Payer: Self-pay

## 2019-09-23 DIAGNOSIS — Z7901 Long term (current) use of anticoagulants: Secondary | ICD-10-CM

## 2019-09-23 DIAGNOSIS — Z952 Presence of prosthetic heart valve: Secondary | ICD-10-CM

## 2019-09-23 DIAGNOSIS — Z5181 Encounter for therapeutic drug level monitoring: Secondary | ICD-10-CM

## 2019-09-23 LAB — POCT INR: INR: 1.4 — AB (ref 2.0–3.0)

## 2019-09-23 NOTE — Patient Instructions (Signed)
Take 3 tablets today and then increase to  3 tablets (15 mg) daily except 2 tablets (10 mg) each Monday and Wednesday.  Repeat INR in 3 weeks. (NEW GOAL 1.5 to 2 - On-X valve)

## 2019-10-04 ENCOUNTER — Other Ambulatory Visit: Payer: Self-pay

## 2019-10-04 ENCOUNTER — Ambulatory Visit (INDEPENDENT_AMBULATORY_CARE_PROVIDER_SITE_OTHER): Payer: BC Managed Care – PPO | Admitting: Registered Nurse

## 2019-10-04 ENCOUNTER — Telehealth: Payer: Self-pay | Admitting: Family Medicine

## 2019-10-04 DIAGNOSIS — E291 Testicular hypofunction: Secondary | ICD-10-CM | POA: Diagnosis not present

## 2019-10-04 NOTE — Telephone Encounter (Signed)
Called pt and LM that Dr Carlota Raspberry is currently out of office with an unspecified return date and that we will call him when the form is complete for him but that it is unlikely going to be done by tomorrow

## 2019-10-04 NOTE — Telephone Encounter (Signed)
10/04/2019 - PATIENT DROPPED OFF A ACCESSIBLE COLLECTION SERVICE APPLICATION FROM THE CITY [place] FOR DR. Carlota Raspberry TO FILL OUT. HE STATES HE CAN NOT PUSH, PULL OR LIFT MORE THAN 20 POUNDS. HE WOULD LIKE TO PICK IT UP ON Wednesday (10/05/2019). PLEASE CALL HIM WHEN IT IS READY.  I WILL PLACE THE FORM IN DR. Vonna Kotyk CUBBY HOLE AT [place].  PATIENT'S PHONE IS [phone]  [place]

## 2019-10-07 ENCOUNTER — Telehealth: Payer: Self-pay | Admitting: Family Medicine

## 2019-10-07 DIAGNOSIS — M79644 Pain in right finger(s): Secondary | ICD-10-CM | POA: Diagnosis not present

## 2019-10-07 NOTE — Telephone Encounter (Signed)
Please F/U with this.  Thank you  

## 2019-10-07 NOTE — Telephone Encounter (Signed)
Pt really needs this paperwork / It is becoming urgent , because his trash is building up and needs this note . He has no one to do for him and really needs the note /     Patient would like to have it by Monday    Please contact pt when paperwork is ready to pick up

## 2019-10-07 NOTE — Telephone Encounter (Signed)
Called and spoke with pt regarding his paper work to gather the info that [place] requested. I have wrote the info down and attached it to the pt's paper work tobe given back to the provider. Will call pt once paper work is done.

## 2019-10-07 NOTE — Telephone Encounter (Signed)
Pt is calling to check on status of his forms being filled out. He would like this to be added to his note-he is not suppose to lift, pull or push more than 20lbs for this could injury his spine. Please advise pt when forms are completed, he would like to pick them up. [phone].

## 2019-10-07 NOTE — Telephone Encounter (Signed)
Sent pt a message thru MyChart to let us know why he is not able to use the automatic container service. Waiting on response.

## 2019-10-14 ENCOUNTER — Other Ambulatory Visit: Payer: Self-pay

## 2019-10-14 ENCOUNTER — Ambulatory Visit (INDEPENDENT_AMBULATORY_CARE_PROVIDER_SITE_OTHER): Payer: BC Managed Care – PPO

## 2019-10-14 DIAGNOSIS — Z7901 Long term (current) use of anticoagulants: Secondary | ICD-10-CM

## 2019-10-14 DIAGNOSIS — Z5181 Encounter for therapeutic drug level monitoring: Secondary | ICD-10-CM

## 2019-10-14 DIAGNOSIS — Z952 Presence of prosthetic heart valve: Secondary | ICD-10-CM

## 2019-10-14 LAB — POCT INR: INR: 1.2 — AB (ref 2.0–3.0)

## 2019-10-14 MED ORDER — WARFARIN SODIUM 5 MG PO TABS: ORAL_TABLET | ORAL | 0 refills | Status: DC

## 2019-10-14 NOTE — Patient Instructions (Signed)
Take 4 tablets today and then continue taking  3 tablets (15 mg) daily except 2 tablets (10 mg) each Monday and Wednesday.  Repeat INR in 3 weeks. (NEW GOAL 1.5 to 2 - On-X valve) Hold Warfarin 10/1-10/6 for Colonoscopy

## 2019-10-14 NOTE — Telephone Encounter (Signed)
Pt. Called in to inquire on the status of the paper work. I spoke with CMA Karac about the issue who asserted it was currently in the doctor's hands.

## 2019-10-18 ENCOUNTER — Ambulatory Visit: Payer: BC Managed Care – PPO

## 2019-10-19 ENCOUNTER — Ambulatory Visit (INDEPENDENT_AMBULATORY_CARE_PROVIDER_SITE_OTHER): Payer: BC Managed Care – PPO | Admitting: Registered Nurse

## 2019-10-19 ENCOUNTER — Telehealth: Payer: Self-pay | Admitting: *Deleted

## 2019-10-19 ENCOUNTER — Other Ambulatory Visit: Payer: Self-pay

## 2019-10-19 DIAGNOSIS — E291 Testicular hypofunction: Secondary | ICD-10-CM

## 2019-10-19 NOTE — Telephone Encounter (Signed)
As you can see per my note, cardiology wanted patient seen in the office before giving clearance. I never scheduled a colonoscopy for this reason. Patient must have called back and scheduled his procedure on his own. I am assuming someone scheduled and did not look at the notes from Cardiology. Patient needs to be cancelled until he has seen by cardiology and cleared or has been cleared by Dr. Ysidro Evert office. Cardiology and I have both reached out to Dr. Ysidro Evert office that has stated they do not give medical clearance for procedures despite what patient is saying. Contacted patient and explained that we did not receive clearance from Dr. Ysidro Evert office or Cardiology prior to him scheduling his Colonoscopy and that we need to reschedule at this time until we get clearance. Per patient, Dr. Fuller Plan stated at the appt in August that he just had to get clearance from the PA [facility]) that works in Dr. Ysidro Evert office. Patient states that this PA is more familiar with his medical history and per patient Dr. Fuller Plan said that was ok. Per patient, Dr. Ysidro Evert office gave him clearance over the telephone several months ago. Patient states he is going to contact Dr. Ysidro Evert office today and see if they can reach out to our office regarding clearance. Informed patient we will need an answer today since his nurse visit is scheduled for tomorrow and colonoscopy next week.

## 2019-10-19 NOTE — Telephone Encounter (Signed)
Dr.Stark,  Please review. Per TE on 09/07/19 we did coumadin hold for 5 days, but per TE on 09/08/2019 the patient was suppose to see Dr.Berry for cardiac clearance but the patient states Dr.Hughes gave him clearance for the colonoscopy so he did not make an OV to see Dr.Berry.  Colonoscopy is scheduled on 10/26/2019, okay to proceed? Thanks, Criag Wicklund pv

## 2019-10-19 NOTE — Telephone Encounter (Signed)
Estill Bamberg please review Robin's note and 8/18 office visit. Clarify clearance and recommendations for anticoagulation hold.

## 2019-10-19 NOTE — Telephone Encounter (Signed)
Patient called back and states he wanted to know why we waited til the last minute to inform him that he needed medical clearance before his procedure. Informed patient that Dr Fuller Plan told him that he needed clearance back in August at his appt. Patient states Dr. Fuller Plan did tell him he needed clearance and patient states he did get clearance already verbally by Joretta Bachelor, NP. Informed patient that if Dr. Fuller Plan is performing the colonoscopy, that he will need to also be informed if he received clearance. Patient states he didn't know Dr. Fuller Plan needed to know he had clearance. Informed patient that we need it in writing and unfortunately when we sent the letter to Joretta Bachelor, NP at Dr. Ysidro Evert office, they did not give [place] clearance. Also, that [facility] cardiology also needed him see in the office before giving you clearance to hold your warfarin. Patient states he once again was upset that we waited to tell him that he needed clearance. Informed patient again that we told him he needed clearance in August and he made his own appt for his colonoscopy so we did not know until now that he was even scheduled. Patient states he will figure it out and hung up on me.

## 2019-10-19 NOTE — Telephone Encounter (Signed)
My note on 09/07/2019 is clear on our routine practice for clearance, anticoagulation mgmt by his cardiac surgeon or by his cardiologist.

## 2019-10-19 NOTE — Telephone Encounter (Signed)
Will route to callback to follow for updates and assist with making appointment if clearance is needed from our office.

## 2019-10-20 NOTE — Telephone Encounter (Signed)
Per the patient's and I coversation yesterday, we will cancel nurse visit and colonoscopy until patient can get clearance either from Joretta Bachelor, NP or [facility] cardiology.

## 2019-10-21 ENCOUNTER — Telehealth: Payer: Self-pay | Admitting: Cardiovascular Disease

## 2019-10-21 NOTE — Telephone Encounter (Signed)
Primary Cardiologist:No primary care provider on file.  Chart reviewed as part of pre-operative protocol coverage. Because of PEACE JOST past medical history and time since last visit, he/she will require a follow-up visit in order to better assess preoperative cardiovascular risk.  Pre-op covering staff: - Please schedule appointment and call patient to inform them. - Please contact requesting surgeon's office via preferred method (i.e, phone, fax) to inform them of need for appointment prior to surgery.  If applicable, this message will also be routed to pharmacy pool and/or primary cardiologist for input on holding anticoagulant/antiplatelet agent as requested below so that this information is available at time of patient's appointment.   Deberah Pelton, NP  10/21/2019, 12:55 PM

## 2019-10-21 NOTE — Telephone Encounter (Signed)
Will send message to NL scheduling team to help set up appt for pre op clearance with Dr. Gwenlyn Found or APP.

## 2019-10-21 NOTE — Telephone Encounter (Signed)
Patient notified that he will need an appt with Cardiology to obtain medical clearance and clearance that he will not need a bridge.  He will contact Dr. Kennon Holter office for an appt.

## 2019-10-21 NOTE — Telephone Encounter (Signed)
Mark Gallagher is calling in regards to his clearance for his colonoscopy that was previously discussed in the telephone note on 09/08/19. He states the PA that stated he would clear him that is documented in the previous telephone note can only advise him on stopping warfarin, but the office performing the surgery still needs Cardiac clearance. Please advise.

## 2019-10-21 NOTE — Telephone Encounter (Signed)
Received fax from Joretta Bachelor, NP at [facility] stating "patient is ok to come off warfarin x 5 days. No bridge is required from our perspective but we do not manage INR. Bridge will have to be decided per [facility] cardiology."

## 2019-10-21 NOTE — Telephone Encounter (Signed)
Left message for patient to call back  

## 2019-10-21 NOTE — Telephone Encounter (Signed)
Patient with diagnosis of on-X aortic valve replacement 10/14/18 on warfarin for anticoagulation (INR goal range 1.5-2).    Procedure: colonoscopy Date of procedure: TBD  Per office protocol, patient can hold warfarin for 5 days prior to procedure.  Patient will NOT need bridging with Lovenox (enoxaparin) around procedure.

## 2019-10-21 NOTE — Telephone Encounter (Signed)
Next opening at [facility] is going to be 12/01/19, can we wait this long? Or, I can check at [facility] if necessary.

## 2019-10-21 NOTE — Telephone Encounter (Signed)
   [facility] Pre-operative Risk Assessment    HEARTCARE STAFF: - Please ensure there is not already an duplicate clearance open for this procedure. - Under Visit Info/Reason for Call, type in Other and utilize the format Clearance MM/DD/YY or Clearance TBD. Do not use dashes or single digits. - If request is for dental extraction, please clarify the # of teeth to be extracted.  Request for surgical clearance:  1. What type of surgery is being performed? COLONOSCOPY   2. When is this surgery scheduled? TBD   3. What type of clearance is required (medical clearance vs. Pharmacy clearance to hold med vs. Both)? BOTH  4. Are there any medications that need to be held prior to surgery and how long? WARFARIN; see notes from 9/29 and 8/19  5. Practice name and name of physician performing surgery? [facility]   6. What is the office phone number? [phone]   7.   What is the office fax number? [phone]  8.   Anesthesia type (None, local, MAC, general) ? PROPOFOL   Julaine Hua 10/21/2019, 11:27 AM  _________________________________________________________________   (provider comments below)

## 2019-10-21 NOTE — Telephone Encounter (Signed)
Operator states left message for call to make pre op appt.

## 2019-10-21 NOTE — Telephone Encounter (Signed)
Ok to check [employer]. If needed

## 2019-10-26 ENCOUNTER — Encounter: Payer: BC Managed Care – PPO | Admitting: Gastroenterology

## 2019-10-26 NOTE — Telephone Encounter (Signed)
Have asked for the [facility] Scheduling team to try and reach out to the pt to set up pre op appt.

## 2019-10-26 NOTE — Telephone Encounter (Signed)
Pt has appt 12/14/19 with Dr. Gwenlyn Found. Pt only wanted to see Dr. Gwenlyn Found for his pre op. I will send FYI to requesting office pt has appt 12/14/19. I will remove from the pre op call back pool.

## 2019-10-28 NOTE — Telephone Encounter (Signed)
Patient called today to advise he has an upcoming appointment with Dr. Kennon Holter office and would like to proceed with scheduling

## 2019-10-31 NOTE — Telephone Encounter (Signed)
Please contact patient and assist him in scheduling a colonoscopy with pre-visit in the [place]. Previsit and the colonoscopy need to be after 11/24

## 2019-11-02 DIAGNOSIS — M79644 Pain in right finger(s): Secondary | ICD-10-CM | POA: Diagnosis not present

## 2019-11-03 ENCOUNTER — Other Ambulatory Visit: Payer: Self-pay

## 2019-11-03 ENCOUNTER — Ambulatory Visit (INDEPENDENT_AMBULATORY_CARE_PROVIDER_SITE_OTHER): Payer: BC Managed Care – PPO | Admitting: Family Medicine

## 2019-11-03 DIAGNOSIS — E291 Testicular hypofunction: Secondary | ICD-10-CM | POA: Diagnosis not present

## 2019-11-03 MED ORDER — TESTOSTERONE CYPIONATE 200 MG/ML IM SOLN
100.0000 mg | INTRAMUSCULAR | Status: DC
  Administered 2019-11-03 – 2020-03-13 (×8): 100 mg via INTRAMUSCULAR
  Administered 2020-03-27: 50 mg via INTRAMUSCULAR
  Administered 2020-04-10 – 2020-06-22 (×5): 100 mg via INTRAMUSCULAR

## 2019-11-04 ENCOUNTER — Ambulatory Visit (INDEPENDENT_AMBULATORY_CARE_PROVIDER_SITE_OTHER): Payer: BC Managed Care – PPO

## 2019-11-04 DIAGNOSIS — Z7901 Long term (current) use of anticoagulants: Secondary | ICD-10-CM

## 2019-11-04 DIAGNOSIS — Z5181 Encounter for therapeutic drug level monitoring: Secondary | ICD-10-CM

## 2019-11-04 DIAGNOSIS — Z952 Presence of prosthetic heart valve: Secondary | ICD-10-CM | POA: Diagnosis not present

## 2019-11-04 LAB — POCT INR: INR: 1.3 — AB (ref 2.0–3.0)

## 2019-11-04 NOTE — Patient Instructions (Signed)
Take 4 tablets today and then increase to   3 tablets (15 mg) daily except 2 tablets (10 mg) each  Wednesday.  Repeat INR in 3 weeks.

## 2019-11-07 DIAGNOSIS — E2740 Unspecified adrenocortical insufficiency: Secondary | ICD-10-CM | POA: Diagnosis not present

## 2019-11-07 DIAGNOSIS — Z6828 Body mass index (BMI) 28.0-28.9, adult: Secondary | ICD-10-CM | POA: Diagnosis not present

## 2019-11-07 DIAGNOSIS — E663 Overweight: Secondary | ICD-10-CM | POA: Diagnosis not present

## 2019-11-07 DIAGNOSIS — E291 Testicular hypofunction: Secondary | ICD-10-CM | POA: Diagnosis not present

## 2019-11-07 DIAGNOSIS — E220 Acromegaly and pituitary gigantism: Secondary | ICD-10-CM | POA: Diagnosis not present

## 2019-11-07 DIAGNOSIS — E890 Postprocedural hypothyroidism: Secondary | ICD-10-CM | POA: Diagnosis not present

## 2019-11-10 DIAGNOSIS — E220 Acromegaly and pituitary gigantism: Secondary | ICD-10-CM | POA: Diagnosis not present

## 2019-11-17 ENCOUNTER — Ambulatory Visit: Payer: BC Managed Care – PPO

## 2019-11-18 ENCOUNTER — Other Ambulatory Visit: Payer: Self-pay

## 2019-11-18 ENCOUNTER — Ambulatory Visit (INDEPENDENT_AMBULATORY_CARE_PROVIDER_SITE_OTHER): Payer: BC Managed Care – PPO | Admitting: Family Medicine

## 2019-11-18 DIAGNOSIS — E291 Testicular hypofunction: Secondary | ICD-10-CM | POA: Diagnosis not present

## 2019-11-18 DIAGNOSIS — Z23 Encounter for immunization: Secondary | ICD-10-CM

## 2019-11-18 NOTE — Patient Instructions (Signed)
° ° ° °  If you have lab work done today you will be contacted with your lab results within the next 2 weeks.  If you have not heard from us then please contact us. The fastest way to get your results is to register for My Chart. ° ° °IF you received an x-ray today, you will receive an invoice from [facility]. Please contact [facility] at [phone] with questions or concerns regarding your invoice.  ° °IF you received labwork today, you will receive an invoice from [facility]. Please contact [facility] at [phone] with questions or concerns regarding your invoice.  ° °Our billing staff will not be able to assist you with questions regarding bills from these companies. ° °You will be contacted with the lab results as soon as they are available. The fastest way to get your results is to activate your My Chart account. Instructions are located on the last page of this paperwork. If you have not heard from us regarding the results in 2 weeks, please contact this office. °  ° ° ° °

## 2019-11-25 ENCOUNTER — Other Ambulatory Visit: Payer: Self-pay

## 2019-11-25 ENCOUNTER — Ambulatory Visit (INDEPENDENT_AMBULATORY_CARE_PROVIDER_SITE_OTHER): Payer: BC Managed Care – PPO

## 2019-11-25 DIAGNOSIS — Z952 Presence of prosthetic heart valve: Secondary | ICD-10-CM | POA: Diagnosis not present

## 2019-11-25 DIAGNOSIS — Z5181 Encounter for therapeutic drug level monitoring: Secondary | ICD-10-CM | POA: Diagnosis not present

## 2019-11-25 DIAGNOSIS — Z7901 Long term (current) use of anticoagulants: Secondary | ICD-10-CM | POA: Diagnosis not present

## 2019-11-25 LAB — POCT INR: INR: 1.7 — AB (ref 2.0–3.0)

## 2019-11-25 NOTE — Patient Instructions (Signed)
Continue taking  3 tablets (15 mg) daily except 2 tablets (10 mg) each  Wednesday.  Repeat INR in 3 weeks.

## 2019-12-01 DIAGNOSIS — I493 Ventricular premature depolarization: Secondary | ICD-10-CM | POA: Diagnosis not present

## 2019-12-01 DIAGNOSIS — I471 Supraventricular tachycardia: Secondary | ICD-10-CM | POA: Diagnosis not present

## 2019-12-01 DIAGNOSIS — I428 Other cardiomyopathies: Secondary | ICD-10-CM | POA: Diagnosis not present

## 2019-12-01 DIAGNOSIS — I10 Essential (primary) hypertension: Secondary | ICD-10-CM | POA: Diagnosis not present

## 2019-12-02 ENCOUNTER — Other Ambulatory Visit: Payer: Self-pay

## 2019-12-02 ENCOUNTER — Ambulatory Visit (INDEPENDENT_AMBULATORY_CARE_PROVIDER_SITE_OTHER): Payer: BC Managed Care – PPO | Admitting: Family Medicine

## 2019-12-02 ENCOUNTER — Encounter: Payer: Self-pay | Admitting: Family Medicine

## 2019-12-02 DIAGNOSIS — E291 Testicular hypofunction: Secondary | ICD-10-CM

## 2019-12-02 NOTE — Progress Notes (Signed)
Nurse visit

## 2019-12-14 ENCOUNTER — Ambulatory Visit (INDEPENDENT_AMBULATORY_CARE_PROVIDER_SITE_OTHER): Payer: BC Managed Care – PPO

## 2019-12-14 ENCOUNTER — Ambulatory Visit (INDEPENDENT_AMBULATORY_CARE_PROVIDER_SITE_OTHER): Payer: BC Managed Care – PPO | Admitting: Cardiovascular Disease

## 2019-12-14 ENCOUNTER — Encounter: Payer: Self-pay | Admitting: Cardiovascular Disease

## 2019-12-14 ENCOUNTER — Other Ambulatory Visit: Payer: Self-pay

## 2019-12-14 VITALS — BP 102/76 | HR 62 | Ht 78.0 in | Wt 251.0 lb

## 2019-12-14 DIAGNOSIS — I712 Thoracic aortic aneurysm, without rupture, unspecified: Secondary | ICD-10-CM

## 2019-12-14 DIAGNOSIS — Z952 Presence of prosthetic heart valve: Secondary | ICD-10-CM

## 2019-12-14 DIAGNOSIS — Z5181 Encounter for therapeutic drug level monitoring: Secondary | ICD-10-CM | POA: Diagnosis not present

## 2019-12-14 DIAGNOSIS — R0989 Other specified symptoms and signs involving the circulatory and respiratory systems: Secondary | ICD-10-CM

## 2019-12-14 DIAGNOSIS — Z7901 Long term (current) use of anticoagulants: Secondary | ICD-10-CM | POA: Diagnosis not present

## 2019-12-14 LAB — POCT INR: INR: 1.4 — AB (ref 2.0–3.0)

## 2019-12-14 MED ORDER — WARFARIN SODIUM 5 MG PO TABS: ORAL_TABLET | ORAL | 0 refills | Status: DC

## 2019-12-14 NOTE — Patient Instructions (Signed)
Take 3 tablets today only and then increase to 3 tablets (15 mg) daily.  Repeat INR in 3 weeks.

## 2019-12-14 NOTE — Progress Notes (Signed)
12/14/2019 Mark Gallagher   [birth year]  [id number]  Primary Physician Wendie Agreste, MD Primary Cardiologist: Lorretta Harp MD FACP, [place], [place], [place]  HPI:  Mark Gallagher is a 49 y.o.  thin and tall appearing single Caucasian male with no children who I last saw in the office 11/16/2018. He was referred by Dr. Shea Stakes at primary care [facility] to be established in our Coumadin clinic.  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  He had thoracic aortic aneurysm resection and grafting with mechanical aortic valve replacement by Dr. Ysidro Evert is at [facility] 10/14/2018.  He is on Coumadin anticoagulation.  He is recovered nicely.  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  Since I saw him a year ago he is done well.  He is very active and has a goal of walking 450 miles a year as well on the way to accomplishing this.  He is asymptomatic.  He continues on warfarin.  He is scheduled for colonoscopy at the end of December for which he will hold his Coumadin for 5 days.  He had a follow-up at [facility] this past July and saw his cardiothoracic surgeon and cardiologist there as well who did an MRI and 2D echo.  He apparently may be enrolled in a apixaban trial for his St. Jude's valve.   Current Meds  Medication Sig  . atorvastatin (LIPITOR) 40 MG tablet Take 40 mg by mouth daily.  . Cholecalciferol (VITAMIN D3) 25 MCG (1000 UT) CAPS Take 2,000 Units by mouth daily.   Marland Kitchen docusate sodium (COLACE) 250 MG capsule Take 250 mg by mouth daily.  Marland Kitchen eplerenone (INSPRA) 50 MG tablet Take 50 mg by mouth daily.  . ferrous sulfate 325 (65 FE) MG tablet Take 1 tablet (325 mg total) by mouth 2 (two) times daily with a meal.  . hydrocortisone (CORTEF) 10 MG tablet Take 1 tablet daily.  You may take 2 tablets daily with excessive weakness and tiredness and lethargy. (Patient taking differently: Take 10 mg by mouth daily. )  .  levothyroxine (SYNTHROID, LEVOTHROID) 200 MCG tablet Take 1 tablet (200 mcg total) by mouth daily.  . metoprolol succinate (TOPROL-XL) 25 MG 24 hr tablet Take 25 mg by mouth daily.  . Multiple Vitamins-Minerals (CENTRUM SILVER PO) Take 1 tablet by mouth daily.  . Pegvisomant 10 MG SOLR Give 40 mg (4 injections) on day one. Everyday afterwards, give 10mg  once daily.  . polyethylene glycol (MIRALAX / GLYCOLAX) packet Take 17 g by mouth daily as needed for mild constipation.  . [DISCONTINUED] warfarin (COUMADIN) 5 MG tablet Take 2 to 3 tablets daily as directed by coumadin clinic   Current Facility-Administered Medications for the 12/14/19 encounter (Office Visit) with Lorretta Harp, MD  Medication  . octreotide (SANDOSTATIN LAR) IM injection 30 mg  . testosterone cypionate (DEPOTESTOSTERONE CYPIONATE) injection 100 mg  . testosterone cypionate (DEPOTESTOSTERONE CYPIONATE) injection 100 mg  . testosterone cypionate (DEPOTESTOSTERONE CYPIONATE) injection 200 mg  . testosterone cypionate (DEPOTESTOSTERONE CYPIONATE) injection 200 mg     Allergies  Allergen Reactions  . Oxycodone Nausea Only    Social History   Socioeconomic History  . Marital status: Single    Spouse name: Not on file  . Number of children: Not on file  . Years of education: Not on file  . Highest education level: Not on  file  Occupational History  . Not on file  Tobacco Use  . Smoking status: Never Smoker  . Smokeless tobacco: Never Used  Vaping Use  . Vaping Use: Never used  Substance and Sexual Activity  . Alcohol use: No  . Drug use: No  . Sexual activity: Not on file  Other Topics Concern  . Not on file  Social History Narrative  . Not on file   Social Determinants of Health   Financial Resource Strain:   . Difficulty of Paying Living Expenses: Not on file  Food Insecurity:   . Worried About Charity fundraiser in the Last Year: Not on file  . Ran Out of Food in the Last Year: Not on file    Transportation Needs:   . Lack of Transportation (Medical): Not on file  . Lack of Transportation (Non-Medical): Not on file  Physical Activity:   . Days of Exercise per Week: Not on file  . Minutes of Exercise per Session: Not on file  Stress:   . Feeling of Stress : Not on file  Social Connections:   . Frequency of Communication with Friends and Family: Not on file  . Frequency of Social Gatherings with Friends and Family: Not on file  . Attends Religious Services: Not on file  . Active Member of Clubs or Organizations: Not on file  . Attends Archivist Meetings: Not on file  . Marital Status: Not on file  Intimate Partner Violence:   . Fear of Current or Ex-Partner: Not on file  . Emotionally Abused: Not on file  . Physically Abused: Not on file  . Sexually Abused: Not on file     Review of Systems: General: negative for chills, fever, night sweats or weight changes.  Cardiovascular: negative for chest pain, dyspnea on exertion, edema, orthopnea, palpitations, paroxysmal nocturnal dyspnea or shortness of breath Dermatological: negative for rash Respiratory: negative for cough or wheezing Urologic: negative for hematuria Abdominal: negative for nausea, vomiting, diarrhea, bright red blood per rectum, melena, or hematemesis Neurologic: negative for visual changes, syncope, or dizziness All other systems reviewed and are otherwise negative except as noted above.    Blood pressure 102/76, pulse 62, height 6\' 6"  (1.981 m), weight 251 lb (113.9 kg).  General appearance: alert and no distress Neck: no adenopathy, no JVD, supple, symmetrical, trachea midline, thyroid not enlarged, symmetric, no tenderness/mass/nodules and High-pitched left carotid bruit Lungs: clear to auscultation bilaterally Heart: Crisp prosthetic aortic valve sounds Extremities: extremities normal, atraumatic, no cyanosis or edema Pulses: 2+ and symmetric Skin: Skin color, texture, turgor normal.  No rashes or lesions Neurologic: Alert and oriented X 3, normal strength and tone. Normal symmetric reflexes. Normal coordination and gait  EKG sinus rhythm at 62 with right bundle branch block.  I personally reviewed this EKG.  ASSESSMENT AND PLAN:   Thoracic aortic aneurysm [facility]) Status post thoracic aortic aneurysm resection and grafting with [facility] Jude aortic valve replacement.  He is followed up with Dr. Ysidro Evert and Dr. Sheppard Coil at [facility] who is done of thorough follow-up this past July.  He is completely asymptomatic.  He is on warfarin oral anticoagulation and has been offered the opportunity to go on a Eliquis trial for his valve which I endorsed.      Lorretta Harp MD FACP,FACC,FAHA, [facility] 12/14/2019 2:47 PM

## 2019-12-14 NOTE — Assessment & Plan Note (Signed)
Status post thoracic aortic aneurysm resection and grafting with [facility] Jude aortic valve replacement.  He is followed up with Dr. Ysidro Evert and Dr. Sheppard Coil at [facility] who is done of thorough follow-up this past July.  He is completely asymptomatic.  He is on warfarin oral anticoagulation and has been offered the opportunity to go on a Eliquis trial for his valve which I endorsed.

## 2019-12-14 NOTE — Patient Instructions (Signed)
Medication Instructions:  Your physician recommends that you continue on your current medications as directed. Please refer to the Current Medication list given to you today.  *If you need a refill on your cardiac medications before your next appointment, please call your pharmacy*  Follow-Up: At [facility], you and your health needs are our priority.  As part of our continuing mission to provide you with exceptional heart care, we have created designated Provider Care Teams.  These Care Teams include your primary Cardiologist (physician) and Advanced Practice Providers (APPs -  Physician Assistants and Nurse Practitioners) who all work together to provide you with the care you need, when you need it.  We recommend signing up for the patient portal called "MyChart".  Sign up information is provided on this After Visit Summary.  MyChart is used to connect with patients for Virtual Visits (Telemedicine).  Patients are able to view lab/test results, encounter notes, upcoming appointments, etc.  Non-urgent messages can be sent to your provider as well.   To learn more about what you can do with MyChart, go to [email or web address].    Your next appointment:   2 year(s)  The format for your next appointment:   In Person  Provider:   You may see Dr. Gwenlyn Found or one of the following Advanced Practice Providers on your designated Care Team:    Kerin Ransom, PA-C  [place], [place]  Coletta Memos, [place]    Other Instructions  You are cleared for colonoscopy from a cardiac standpoint

## 2019-12-19 ENCOUNTER — Other Ambulatory Visit: Payer: Self-pay

## 2019-12-19 ENCOUNTER — Ambulatory Visit (INDEPENDENT_AMBULATORY_CARE_PROVIDER_SITE_OTHER): Payer: BC Managed Care – PPO | Admitting: Family Medicine

## 2019-12-19 DIAGNOSIS — E291 Testicular hypofunction: Secondary | ICD-10-CM | POA: Diagnosis not present

## 2019-12-19 NOTE — Patient Instructions (Signed)
° ° ° °  If you have lab work done today you will be contacted with your lab results within the next 2 weeks.  If you have not heard from us then please contact us. The fastest way to get your results is to register for My Chart. ° ° °IF you received an x-ray today, you will receive an invoice from [facility]. Please contact [facility] at [phone] with questions or concerns regarding your invoice.  ° °IF you received labwork today, you will receive an invoice from [facility]. Please contact [facility] at [phone] with questions or concerns regarding your invoice.  ° °Our billing staff will not be able to assist you with questions regarding bills from these companies. ° °You will be contacted with the lab results as soon as they are available. The fastest way to get your results is to activate your My Chart account. Instructions are located on the last page of this paperwork. If you have not heard from us regarding the results in 2 weeks, please contact this office. °  ° ° ° °

## 2019-12-22 ENCOUNTER — Ambulatory Visit (HOSPITAL_COMMUNITY)
Admission: RE | Admit: 2019-12-22 | Discharge: 2019-12-22 | Disposition: A | Discharge status: Home / Self Care | Payer: BC Managed Care – PPO | Source: Ambulatory Visit | Attending: Cardiovascular Disease | Admitting: Cardiovascular Disease

## 2019-12-22 ENCOUNTER — Other Ambulatory Visit: Payer: Self-pay

## 2019-12-22 DIAGNOSIS — R0989 Other specified symptoms and signs involving the circulatory and respiratory systems: Secondary | ICD-10-CM | POA: Diagnosis not present

## 2019-12-23 ENCOUNTER — Telehealth: Payer: Self-pay | Admitting: *Deleted

## 2019-12-23 NOTE — Telephone Encounter (Signed)
[facility] Pre-operative Risk Assessment     Request for surgical clearance:     Endoscopy Procedure  What type of surgery is being performed?     Colonoscopy  When is this surgery scheduled?     01/10/20  What type of clearance is required ?   Pharmacy  Are there any medications that need to be held prior to surgery and how long? Eliquis x 2 days  Practice name and name of physician performing surgery?      [facility]  What is your office phone and fax number?      Phone- [phone]  [phone]  Anesthesia type (None, local, MAC, general) ?       MAC

## 2019-12-23 NOTE — Telephone Encounter (Signed)
See note, pt is not currently on Eliquis. We already have clearance for coumadin. Thanks

## 2019-12-23 NOTE — Telephone Encounter (Signed)
We have Coumadin clearance on this pt for 5 days however he  has entered into an Eliquis Study and will need to hold this with his Coumadin for his colon 12-21.  Please obtain hold for the Eliquis - Dr Gwenlyn Found endorsed this trial at the 12-14-2019 OV - pt saw [facility] 12-01-2019.  PV is 12-7 Tuesday   ThanksLelan Pons [facility]

## 2019-12-23 NOTE — Telephone Encounter (Signed)
Patient may hold warfarin for 5 days prior to procedure. He may resume the evening of the procedure as long as ok with GI.

## 2019-12-23 NOTE — Telephone Encounter (Deleted)
Is patient on Eliquis?  Med list says warfarin for aortic valve replacement

## 2019-12-23 NOTE — Telephone Encounter (Signed)
Patient is enrolled in an anticoagulation study through [employer].  Unclear what the study protocol is.  He could be taking warfarin or placebo, or Eliquis or placebo, or some other variation?  I am concerned instructing patient to hold warfarin, Eliquis, or both, without letting study investigators know could jeopardize the study protocol.  Recommend forwarding this message to [facility] for their input.  Will send to Dr. Gwenlyn Found for input as well.

## 2019-12-23 NOTE — Telephone Encounter (Signed)
Called and spoke with pt he states that he has not start the anticoagulation study yet he is going to start after his colonoscopy. He will need warfarin instructions. He states that at his appt with Dr Gwenlyn Found he was told by Dr Gwenlyn Found to stop his coumadin 5 days prior to procedure. Is this ok? Please advise

## 2019-12-23 NOTE — Telephone Encounter (Signed)
   Primary Cardiologist: Quay Burow, MD  Chart reviewed as part of pre-operative protocol coverage. Given past medical history and time since last visit, based on ACC/AHA guidelines, Mark Gallagher would be at acceptable risk for the planned procedure without further cardiovascular testing.   His warfarin may be held for 5 days prior to his procedure.  Please resume as soon as hemostasis is achieved.  Patient was advised that if he develops new symptoms prior to surgery to contact our office to arrange a follow-up appointment.  He verbalized understanding.  I will route this recommendation to the requesting party via Epic fax function and remove from pre-op pool.  Please call with questions.  Jossie Ng. Starlette Thurow NP-C    12/23/2019, 1:42 PM [facility] [address] Office [phone] Fax [phone]

## 2019-12-26 ENCOUNTER — Telehealth: Payer: Self-pay | Admitting: Cardiovascular Disease

## 2019-12-26 NOTE — Telephone Encounter (Signed)
Pt made aware of results no questions at this time.   

## 2019-12-26 NOTE — Progress Notes (Signed)
Pt made aware of results no questions at this time.   

## 2019-12-26 NOTE — Telephone Encounter (Signed)
Patient is requesting to review carotid results. Please return call to discuss.

## 2019-12-27 ENCOUNTER — Ambulatory Visit (AMBULATORY_SURGERY_CENTER): Payer: Self-pay | Admitting: *Deleted

## 2019-12-27 ENCOUNTER — Other Ambulatory Visit: Payer: Self-pay

## 2019-12-27 VITALS — Ht 78.0 in | Wt 243.0 lb

## 2019-12-27 DIAGNOSIS — Z1211 Encounter for screening for malignant neoplasm of colon: Secondary | ICD-10-CM

## 2019-12-27 MED ORDER — PLENVU 140 G PO SOLR: 1.0000 | ORAL | 0 refills | Status: DC

## 2019-12-27 NOTE — Progress Notes (Signed)
No egg or soy allergy known to patient    issues with past sedation with any surgeries or procedures of hypotension-   no intubation problems in the past  No FH of Malignant Hyperthermia  No diet pills per patient No home 02 use per patient  On Coumadin  blood thinners per patient - we have hold x 5 days- pt will start Eliquis trial after Colon   Pt denies issues with constipation  No A fib or A flutter  EMMI video to pt or via [place] 19 guidelines implemented in PV today with Pt and RN   Plenvu  Coupon given to pt in PV today , Code to Pharmacy   Due to the COVID-19 pandemic we are asking patients to follow these guidelines. Please only bring one care partner. Please be aware that your care partner may wait in the car in the parking lot or if they feel like they will be too hot to wait in the car, they may wait in the lobby on the 4th floor. All care partners are required to wear a mask the entire time (we do not have any that we can provide them), they need to practice social distancing, and we will do a Covid check for all patient's and care partners when you arrive. Also we will check their temperature and your temperature. If the care partner waits in their car they need to stay in the parking lot the entire time and we will call them on their cell phone when the patient is ready for discharge so they can bring the car to the front of the building. Also all patient's will need to wear a mask into building.

## 2019-12-28 ENCOUNTER — Ambulatory Visit (INDEPENDENT_AMBULATORY_CARE_PROVIDER_SITE_OTHER): Payer: BC Managed Care – PPO

## 2019-12-28 DIAGNOSIS — Z952 Presence of prosthetic heart valve: Secondary | ICD-10-CM

## 2019-12-28 DIAGNOSIS — Z7901 Long term (current) use of anticoagulants: Secondary | ICD-10-CM

## 2019-12-28 DIAGNOSIS — Z5181 Encounter for therapeutic drug level monitoring: Secondary | ICD-10-CM | POA: Diagnosis not present

## 2019-12-28 LAB — POCT INR: INR: 1.4 — AB (ref 2.0–3.0)

## 2019-12-28 NOTE — Patient Instructions (Signed)
Continue taking 3 tablets (15 mg) daily.  Repeat INR in 3 weeks. Hold Warfarin 12/16-12/20.  Starting Eliquis after Colonoscopy 12/21.

## 2019-12-29 ENCOUNTER — Encounter: Payer: Self-pay | Admitting: Gastroenterology

## 2020-01-02 ENCOUNTER — Other Ambulatory Visit: Payer: Self-pay

## 2020-01-02 ENCOUNTER — Ambulatory Visit (INDEPENDENT_AMBULATORY_CARE_PROVIDER_SITE_OTHER): Payer: BC Managed Care – PPO | Admitting: Family Medicine

## 2020-01-02 DIAGNOSIS — E291 Testicular hypofunction: Secondary | ICD-10-CM

## 2020-01-02 NOTE — Progress Notes (Signed)
Pt tolerated Well . Left side

## 2020-01-05 DIAGNOSIS — E220 Acromegaly and pituitary gigantism: Secondary | ICD-10-CM | POA: Diagnosis not present

## 2020-01-10 ENCOUNTER — Ambulatory Visit (AMBULATORY_SURGERY_CENTER): Payer: BC Managed Care – PPO | Admitting: Gastroenterology

## 2020-01-10 ENCOUNTER — Other Ambulatory Visit: Payer: Self-pay

## 2020-01-10 ENCOUNTER — Encounter: Payer: Self-pay | Admitting: Gastroenterology

## 2020-01-10 VITALS — BP 130/72 | HR 52 | Temp 97.6°F | Resp 10 | Ht 78.0 in | Wt 243.0 lb

## 2020-01-10 DIAGNOSIS — K5520 Angiodysplasia of colon without hemorrhage: Secondary | ICD-10-CM | POA: Diagnosis not present

## 2020-01-10 DIAGNOSIS — D509 Iron deficiency anemia, unspecified: Secondary | ICD-10-CM | POA: Diagnosis not present

## 2020-01-10 DIAGNOSIS — Z1211 Encounter for screening for malignant neoplasm of colon: Secondary | ICD-10-CM | POA: Diagnosis not present

## 2020-01-10 NOTE — Progress Notes (Signed)
Pt's states no medical or surgical changes since previsit or office visit.  VS CW  

## 2020-01-10 NOTE — Progress Notes (Signed)
pt tolerated well. VSS. awake and to recovery. Report given to RN.  

## 2020-01-10 NOTE — Op Note (Signed)
[facility] Patient Name: Mark Gallagher Procedure Date: 01/10/2020 9:52 AM MRN: [id number] Endoscopist: Ladene Artist , MD Age: 49 Referring MD:  Date of Birth: May 23, 1970 Gender: Male Account #: [id number] Procedure:                Colonoscopy Indications:              Iron deficiency anemia Medicines:                Monitored Anesthesia Care Procedure:                Pre-Anesthesia Assessment:                           - Prior to the procedure, a History and Physical                            was performed, and patient medications and                            allergies were reviewed. The patient's tolerance of                            previous anesthesia was also reviewed. The risks                            and benefits of the procedure and the sedation                            options and risks were discussed with the patient.                            All questions were answered, and informed consent                            was obtained. Prior Anticoagulants: The patient has                            taken Coumadin (warfarin), last dose was 5 days                            prior to procedure. ASA Grade Assessment: III - A                            patient with severe systemic disease. After                            reviewing the risks and benefits, the patient was                            deemed in satisfactory condition to undergo the                            procedure.  After obtaining informed consent, the colonoscope                            was passed under direct vision. Throughout the                            procedure, the patient's blood pressure, pulse, and                            oxygen saturations were monitored continuously. The                            Olympus [id number] was introduced                            through the anus and advanced to the the cecum,                             identified by appendiceal orifice and ileocecal                            valve. The ileocecal valve, appendiceal orifice,                            and rectum were photographed. The quality of the                            bowel preparation was adequate after extensive                            lavage and suction. The colonoscopy was technically                            difficult and complex due to inadequate bowel prep                            that improved to adequate, a redundant colon and a                            tortuous colon. The patient tolerated the procedure                            well. Scope In: 10:04:35 AM Scope Out: 10:50:26 AM Scope Withdrawal Time: 0 hours 23 minutes 14 seconds  Total Procedure Duration: 0 hours 45 minutes 51 seconds  Findings:                 The perianal and digital rectal examinations were                            normal.                           A single medium-sized localized angiodysplastic  lesion without bleeding was found in the proximal                            transverse colon.                           The exam was otherwise without abnormality on                            direct and retroflexion views. Complications:            No immediate complications. Estimated blood loss:                            None. Estimated Blood Loss:     Estimated blood loss: none. Impression:               - A single non-bleeding colonic angiodysplastic                            lesion.                           - The examination was otherwise normal on direct                            and retroflexion views.                           - No specimens collected. Recommendation:           - Repeat colonoscopy in 5 years for screening                            purposes with a more extensive bowel prep.                           - Resume Coumadin (warfarin) tomorrow at prior                            dose.  Refer to managing physician for further                            adjustment of therapy.                           - Patient has a contact number available for                            emergencies. The signs and symptoms of potential                            delayed complications were discussed with the                            patient. Return to normal activities tomorrow.  Written discharge instructions were provided to the                            patient.                           - Resume previous diet.                           - Continue present medications. Ladene Artist, MD 01/10/2020 10:55:53 AM This report has been signed electronically.

## 2020-01-10 NOTE — Progress Notes (Signed)
Patient's mom came to recovery , and helped him get dressed.

## 2020-01-10 NOTE — Patient Instructions (Signed)
You may resume your coumadin  Tomorrow at the prior dose.  Thank-you for choosing [place] for your healthcare needs today.  YOU HAD AN ENDOSCOPIC PROCEDURE TODAY AT [facility]:   Refer to the procedure report that was given to you for any specific questions about what was found during the examination.  If the procedure report does not answer your questions, please call your gastroenterologist to clarify.  If you requested that your care partner not be given the details of your procedure findings, then the procedure report has been included in a sealed envelope for you to review at your convenience later.  YOU SHOULD EXPECT: Some feelings of bloating in the abdomen. Passage of more gas than usual.  Walking can help get rid of the air that was put into your GI tract during the procedure and reduce the bloating. If you had a lower endoscopy (such as a colonoscopy or flexible sigmoidoscopy) you may notice spotting of blood in your stool or on the toilet paper. If you underwent a bowel prep for your procedure, you may not have a normal bowel movement for a few days.  Please Note:  You might notice some irritation and congestion in your nose or some drainage.  This is from the oxygen used during your procedure.  There is no need for concern and it should clear up in a day or so.  SYMPTOMS TO REPORT IMMEDIATELY:   Following lower endoscopy (colonoscopy or flexible sigmoidoscopy):  Excessive amounts of blood in the stool  Significant tenderness or worsening of abdominal pains  Swelling of the abdomen that is new, acute  Fever of 100F or higher   For urgent or emergent issues, a gastroenterologist can be reached at any hour by calling [phone]. Do not use MyChart messaging for urgent concerns.    DIET:  We do recommend a small meal at first, but then you may proceed to your regular diet.  Drink plenty of fluids but you should avoid alcoholic beverages for 24 hours.  ACTIVITY:  You  should plan to take it easy for the rest of today and you should NOT DRIVE or use heavy machinery until tomorrow (because of the sedation medicines used during the test).    FOLLOW UP: Our staff will call the number listed on your records 48-72 hours following your procedure to check on you and address any questions or concerns that you may have regarding the information given to you following your procedure. If we do not reach you, we will leave a message.  We will attempt to reach you two times.  During this call, we will ask if you have developed any symptoms of COVID 19. If you develop any symptoms (ie: fever, flu-like symptoms, shortness of breath, cough etc.) before then, please call [phone].  If you test positive for Covid 19 in the 2 weeks post procedure, please call and report this information to [place].     SIGNATURES/CONFIDENTIALITY: You and/or your care partner have signed paperwork which will be entered into your electronic medical record.  These signatures attest to the fact that that the information above on your After Visit Summary has been reviewed and is understood.  Full responsibility of the confidentiality of this discharge information lies with you and/or your care-partner.

## 2020-01-12 ENCOUNTER — Telehealth: Payer: Self-pay

## 2020-01-12 NOTE — Telephone Encounter (Signed)
  Follow up Call-  Call back number 01/10/2020  Post procedure Call Back phone  # [phone]  Permission to leave phone message Yes  Some recent data might be hidden     Patient questions:  Do you have a fever, pain , or abdominal swelling? No. Pain Score  0 *  Have you tolerated food without any problems? Yes.    Have you been able to return to your normal activities? Yes.    Do you have any questions about your discharge instructions: Diet   No. Medications  No. Follow up visit  No.  Do you have questions or concerns about your Care? No.  Actions: * If pain score is 4 or above: No action needed, pain <4.  1. Have you developed a fever since your procedure? no  2.   Have you had an respiratory symptoms (SOB or cough) since your procedure? no  3.   Have you tested positive for COVID 19 since your procedure no  4.   Have you had any family members/close contacts diagnosed with the COVID 19 since your procedure?  no   If yes to any of these questions please route to Joylene John, RN and Joella Prince, RN

## 2020-01-16 ENCOUNTER — Telehealth: Payer: Self-pay | Admitting: Cardiovascular Disease

## 2020-01-16 ENCOUNTER — Ambulatory Visit: Payer: BC Managed Care – PPO

## 2020-01-16 NOTE — Telephone Encounter (Signed)
Will route to MD as FYI.  Thanks!

## 2020-01-16 NOTE — Telephone Encounter (Signed)
Thanks

## 2020-01-16 NOTE — Telephone Encounter (Signed)
He told me at his recent visit

## 2020-01-16 NOTE — Telephone Encounter (Signed)
Pt c/o medication issue:  1. Name of Medication: apixaban (ELIQUIS) 5 MG TABS tablet  2. How are you currently taking this medication (dosage and times per day)? Pt started this medication 01/10/20  3. Are you having a reaction (difficulty breathing--STAT)? no  4. What is your medication issue? Patient wanted to let Dr. Allyson Sabal know that he is not taking the warfarin (COUMADIN) 5 MG tablet any longer and is taking the other medication now as advised by his Cardiologist at [facility]

## 2020-01-17 ENCOUNTER — Other Ambulatory Visit: Payer: Self-pay

## 2020-01-17 ENCOUNTER — Ambulatory Visit (INDEPENDENT_AMBULATORY_CARE_PROVIDER_SITE_OTHER): Payer: BC Managed Care – PPO | Admitting: Registered Nurse

## 2020-01-17 DIAGNOSIS — E291 Testicular hypofunction: Secondary | ICD-10-CM | POA: Diagnosis not present

## 2020-01-17 NOTE — Patient Instructions (Signed)
° ° ° °  If you have lab work done today you will be contacted with your lab results within the next 2 weeks.  If you have not heard from us then please contact us. The fastest way to get your results is to register for My Chart. ° ° °IF you received an x-ray today, you will receive an invoice from [facility]. Please contact [facility] at [phone] with questions or concerns regarding your invoice.  ° °IF you received labwork today, you will receive an invoice from [facility]. Please contact [facility] at [phone] with questions or concerns regarding your invoice.  ° °Our billing staff will not be able to assist you with questions regarding bills from these companies. ° °You will be contacted with the lab results as soon as they are available. The fastest way to get your results is to activate your My Chart account. Instructions are located on the last page of this paperwork. If you have not heard from us regarding the results in 2 weeks, please contact this office. °  ° ° ° °

## 2020-01-17 NOTE — Progress Notes (Signed)
Pt seen for Testosterone injection given on Rt side tolerated well pt did complain of some tenderness on rt side with injection advised massaging area to release knots or concentrated medication.   0.5 ml given with 20 G 1 inch needle to Rt upper outer quadrant of flank

## 2020-01-31 ENCOUNTER — Ambulatory Visit (INDEPENDENT_AMBULATORY_CARE_PROVIDER_SITE_OTHER): Payer: BC Managed Care – PPO | Admitting: Registered Nurse

## 2020-01-31 ENCOUNTER — Other Ambulatory Visit: Payer: Self-pay

## 2020-01-31 DIAGNOSIS — E291 Testicular hypofunction: Secondary | ICD-10-CM

## 2020-02-02 ENCOUNTER — Telehealth: Payer: Self-pay

## 2020-02-02 NOTE — Telephone Encounter (Signed)
lmom for overdue inr 

## 2020-02-02 NOTE — Telephone Encounter (Signed)
      I went in pt's chart to see who called her. The call was transferred to the [facility]

## 2020-02-08 ENCOUNTER — Telehealth: Payer: Self-pay

## 2020-02-08 NOTE — Telephone Encounter (Signed)
Unable to lmom for overdue inr 

## 2020-02-14 ENCOUNTER — Ambulatory Visit (INDEPENDENT_AMBULATORY_CARE_PROVIDER_SITE_OTHER): Payer: BC Managed Care – PPO | Admitting: Registered Nurse

## 2020-02-14 ENCOUNTER — Other Ambulatory Visit: Payer: Self-pay

## 2020-02-14 DIAGNOSIS — E291 Testicular hypofunction: Secondary | ICD-10-CM | POA: Diagnosis not present

## 2020-02-14 NOTE — Patient Instructions (Signed)
° ° ° °  If you have lab work done today you will be contacted with your lab results within the next 2 weeks.  If you have not heard from us then please contact us. The fastest way to get your results is to register for My Chart. ° ° °IF you received an x-ray today, you will receive an invoice from [facility]. Please contact [facility] at [phone] with questions or concerns regarding your invoice.  ° °IF you received labwork today, you will receive an invoice from [facility]. Please contact [facility] at [phone] with questions or concerns regarding your invoice.  ° °Our billing staff will not be able to assist you with questions regarding bills from these companies. ° °You will be contacted with the lab results as soon as they are available. The fastest way to get your results is to activate your My Chart account. Instructions are located on the last page of this paperwork. If you have not heard from us regarding the results in 2 weeks, please contact this office. °  ° ° ° °

## 2020-02-14 NOTE — Progress Notes (Signed)
Pt supplied testosterone for injection. Verified 0.5 ml from 1 ml vial. Pt reports feeling well no concerns. Given today Lt Ventrogluteal per pt request. 20 g 1 inch needle used

## 2020-02-28 ENCOUNTER — Ambulatory Visit (INDEPENDENT_AMBULATORY_CARE_PROVIDER_SITE_OTHER): Payer: BC Managed Care – PPO | Admitting: Emergency Medicine

## 2020-02-28 ENCOUNTER — Other Ambulatory Visit: Payer: Self-pay

## 2020-02-28 DIAGNOSIS — E871 Hypo-osmolality and hyponatremia: Secondary | ICD-10-CM

## 2020-03-08 DIAGNOSIS — M2042 Other hammer toe(s) (acquired), left foot: Secondary | ICD-10-CM | POA: Diagnosis not present

## 2020-03-08 DIAGNOSIS — L97512 Non-pressure chronic ulcer of other part of right foot with fat layer exposed: Secondary | ICD-10-CM | POA: Diagnosis not present

## 2020-03-08 DIAGNOSIS — M79672 Pain in left foot: Secondary | ICD-10-CM | POA: Diagnosis not present

## 2020-03-08 DIAGNOSIS — M79671 Pain in right foot: Secondary | ICD-10-CM | POA: Diagnosis not present

## 2020-03-08 DIAGNOSIS — M2041 Other hammer toe(s) (acquired), right foot: Secondary | ICD-10-CM | POA: Diagnosis not present

## 2020-03-13 ENCOUNTER — Ambulatory Visit (INDEPENDENT_AMBULATORY_CARE_PROVIDER_SITE_OTHER): Payer: BC Managed Care – PPO | Admitting: Family Medicine

## 2020-03-13 ENCOUNTER — Other Ambulatory Visit: Payer: Self-pay

## 2020-03-13 DIAGNOSIS — E291 Testicular hypofunction: Secondary | ICD-10-CM | POA: Diagnosis not present

## 2020-03-13 NOTE — Patient Instructions (Signed)
° ° ° °  If you have lab work done today you will be contacted with your lab results within the next 2 weeks.  If you have not heard from us then please contact us. The fastest way to get your results is to register for My Chart. ° ° °IF you received an x-ray today, you will receive an invoice from [facility]. Please contact [facility] at [phone] with questions or concerns regarding your invoice.  ° °IF you received labwork today, you will receive an invoice from [facility]. Please contact [facility] at [phone] with questions or concerns regarding your invoice.  ° °Our billing staff will not be able to assist you with questions regarding bills from these companies. ° °You will be contacted with the lab results as soon as they are available. The fastest way to get your results is to activate your My Chart account. Instructions are located on the last page of this paperwork. If you have not heard from us regarding the results in 2 weeks, please contact this office. °  ° ° ° °

## 2020-03-22 DIAGNOSIS — M71571 Other bursitis, not elsewhere classified, right ankle and foot: Secondary | ICD-10-CM | POA: Diagnosis not present

## 2020-03-22 DIAGNOSIS — M71572 Other bursitis, not elsewhere classified, left ankle and foot: Secondary | ICD-10-CM | POA: Diagnosis not present

## 2020-03-27 ENCOUNTER — Other Ambulatory Visit: Payer: Self-pay

## 2020-03-27 ENCOUNTER — Ambulatory Visit (INDEPENDENT_AMBULATORY_CARE_PROVIDER_SITE_OTHER): Payer: BC Managed Care – PPO | Admitting: Emergency Medicine

## 2020-03-27 DIAGNOSIS — E291 Testicular hypofunction: Secondary | ICD-10-CM

## 2020-04-10 ENCOUNTER — Ambulatory Visit (INDEPENDENT_AMBULATORY_CARE_PROVIDER_SITE_OTHER): Payer: BC Managed Care – PPO | Admitting: Emergency Medicine

## 2020-04-10 ENCOUNTER — Telehealth: Payer: Self-pay | Admitting: Family Medicine

## 2020-04-10 DIAGNOSIS — E291 Testicular hypofunction: Secondary | ICD-10-CM | POA: Diagnosis not present

## 2020-04-10 NOTE — Telephone Encounter (Signed)
Pt states he tried to call [facility] today and schedule his Testerone injection. But he was told no one there giving injections. He would like a call back to schedule his next appt in 2 weeks

## 2020-04-24 ENCOUNTER — Ambulatory Visit (INDEPENDENT_AMBULATORY_CARE_PROVIDER_SITE_OTHER): Payer: BC Managed Care – PPO | Admitting: Registered Nurse

## 2020-04-24 ENCOUNTER — Other Ambulatory Visit: Payer: Self-pay

## 2020-04-24 DIAGNOSIS — E291 Testicular hypofunction: Secondary | ICD-10-CM | POA: Diagnosis not present

## 2020-05-03 DIAGNOSIS — M71572 Other bursitis, not elsewhere classified, left ankle and foot: Secondary | ICD-10-CM | POA: Diagnosis not present

## 2020-05-08 ENCOUNTER — Ambulatory Visit: Payer: BC Managed Care – PPO

## 2020-05-08 DIAGNOSIS — Z79899 Other long term (current) drug therapy: Secondary | ICD-10-CM | POA: Diagnosis not present

## 2020-05-08 DIAGNOSIS — E220 Acromegaly and pituitary gigantism: Secondary | ICD-10-CM | POA: Diagnosis not present

## 2020-05-08 DIAGNOSIS — E349 Endocrine disorder, unspecified: Secondary | ICD-10-CM | POA: Diagnosis not present

## 2020-05-08 DIAGNOSIS — E2740 Unspecified adrenocortical insufficiency: Secondary | ICD-10-CM | POA: Diagnosis not present

## 2020-05-08 DIAGNOSIS — E890 Postprocedural hypothyroidism: Secondary | ICD-10-CM | POA: Diagnosis not present

## 2020-05-08 DIAGNOSIS — E291 Testicular hypofunction: Secondary | ICD-10-CM | POA: Diagnosis not present

## 2020-05-09 ENCOUNTER — Ambulatory Visit (INDEPENDENT_AMBULATORY_CARE_PROVIDER_SITE_OTHER): Payer: BC Managed Care – PPO | Admitting: Registered Nurse

## 2020-05-09 ENCOUNTER — Other Ambulatory Visit: Payer: Self-pay

## 2020-05-09 DIAGNOSIS — E291 Testicular hypofunction: Secondary | ICD-10-CM

## 2020-05-09 NOTE — Progress Notes (Signed)
Per Dr. Carlota Raspberry orders patient was given testosterone injection in RUQ. Patient tolerated well.

## 2020-05-15 DIAGNOSIS — M21372 Foot drop, left foot: Secondary | ICD-10-CM | POA: Diagnosis not present

## 2020-05-23 ENCOUNTER — Other Ambulatory Visit: Payer: Self-pay

## 2020-05-23 ENCOUNTER — Ambulatory Visit (INDEPENDENT_AMBULATORY_CARE_PROVIDER_SITE_OTHER): Payer: BC Managed Care – PPO | Admitting: *Deleted

## 2020-05-23 DIAGNOSIS — E291 Testicular hypofunction: Secondary | ICD-10-CM | POA: Diagnosis not present

## 2020-05-23 NOTE — Progress Notes (Signed)
Pt here for testosterone injection per Dr. Vonna Kotyk orders. The vial he brought has only 0.3 mls left in it. Pt requested to give him what's in the vial. Injection given IM in RV, tolerated well. Next injection scheduled for 5/18.

## 2020-06-01 DIAGNOSIS — M25572 Pain in left ankle and joints of left foot: Secondary | ICD-10-CM | POA: Diagnosis not present

## 2020-06-06 ENCOUNTER — Ambulatory Visit (INDEPENDENT_AMBULATORY_CARE_PROVIDER_SITE_OTHER): Payer: BC Managed Care – PPO | Admitting: Family Medicine

## 2020-06-06 ENCOUNTER — Other Ambulatory Visit: Payer: Self-pay

## 2020-06-06 DIAGNOSIS — E291 Testicular hypofunction: Secondary | ICD-10-CM

## 2020-06-06 NOTE — Progress Notes (Signed)
Per Dr. Carlota Raspberry patient received his Testosterone injection. Patient Tolerated well and will return in two weeks.

## 2020-06-12 DIAGNOSIS — E2740 Unspecified adrenocortical insufficiency: Secondary | ICD-10-CM | POA: Diagnosis not present

## 2020-06-12 DIAGNOSIS — E220 Acromegaly and pituitary gigantism: Secondary | ICD-10-CM | POA: Diagnosis not present

## 2020-06-12 DIAGNOSIS — E349 Endocrine disorder, unspecified: Secondary | ICD-10-CM | POA: Diagnosis not present

## 2020-06-12 DIAGNOSIS — E890 Postprocedural hypothyroidism: Secondary | ICD-10-CM | POA: Diagnosis not present

## 2020-06-19 ENCOUNTER — Telehealth: Payer: Self-pay | Admitting: Physician Assistant

## 2020-06-19 NOTE — Telephone Encounter (Signed)
Patient left message on office voice mail wanting to find out if there is a stronger deodorant that [facility], PA-C could recommend.  Patient says that with the hotter weather he is sweating more than usual.

## 2020-06-19 NOTE — Telephone Encounter (Signed)
Phone call to patient with Mark Gallagher recommendation to try Certain Dri nightly and use regular Deoderant during the day. Patient aware.

## 2020-06-20 ENCOUNTER — Ambulatory Visit: Payer: BC Managed Care – PPO

## 2020-06-22 ENCOUNTER — Other Ambulatory Visit: Payer: Self-pay

## 2020-06-22 ENCOUNTER — Ambulatory Visit (INDEPENDENT_AMBULATORY_CARE_PROVIDER_SITE_OTHER): Payer: BC Managed Care – PPO | Admitting: Family Medicine

## 2020-06-22 DIAGNOSIS — E291 Testicular hypofunction: Secondary | ICD-10-CM

## 2020-06-22 NOTE — Progress Notes (Signed)
Pt administered 0.5 mL testosterone injection per [name] standing order. 0.5 mL once every 14 days. tolerated well.

## 2020-07-06 ENCOUNTER — Ambulatory Visit (INDEPENDENT_AMBULATORY_CARE_PROVIDER_SITE_OTHER): Payer: BC Managed Care – PPO | Admitting: Family Medicine

## 2020-07-06 ENCOUNTER — Ambulatory Visit: Payer: BC Managed Care – PPO

## 2020-07-06 ENCOUNTER — Other Ambulatory Visit: Payer: Self-pay

## 2020-07-06 DIAGNOSIS — E291 Testicular hypofunction: Secondary | ICD-10-CM | POA: Diagnosis not present

## 2020-07-06 NOTE — Progress Notes (Signed)
Per orders of Dr.Green, injection of Testosterone200MG /ML. Given 0.5 ml  right hip  by Wilmon Arms Patient tolerated injection well.  Lot: [id number] EXP06/30/2023 [id number]

## 2020-07-10 NOTE — Progress Notes (Signed)
Agree with note and order.  Signed,   [name], MD [place] 07/10/20 12:21 PM

## 2020-07-20 ENCOUNTER — Other Ambulatory Visit: Payer: Self-pay

## 2020-07-20 ENCOUNTER — Telehealth: Payer: Self-pay

## 2020-07-20 ENCOUNTER — Ambulatory Visit (INDEPENDENT_AMBULATORY_CARE_PROVIDER_SITE_OTHER): Payer: BC Managed Care – PPO

## 2020-07-20 DIAGNOSIS — E291 Testicular hypofunction: Secondary | ICD-10-CM | POA: Diagnosis not present

## 2020-07-20 MED ORDER — TESTOSTERONE CYPIONATE 200 MG/ML IM SOLN
200.0000 mg | INTRAMUSCULAR | Status: DC
  Administered 2020-07-20: 200 mg via INTRAMUSCULAR

## 2020-07-20 NOTE — Telephone Encounter (Signed)
Return [facility] phone call

## 2020-07-20 NOTE — Progress Notes (Signed)
Patient presents to the office to receive his testosterone injection. Patient received 0.18ml of  testosterone cypionate in his left glute and tolerated well. Patient will return to the office in two weeks to receive another testosterone injection.

## 2020-07-24 NOTE — Telephone Encounter (Signed)
Message already taken care of

## 2020-08-02 DIAGNOSIS — M71572 Other bursitis, not elsewhere classified, left ankle and foot: Secondary | ICD-10-CM | POA: Diagnosis not present

## 2020-08-03 ENCOUNTER — Other Ambulatory Visit: Payer: Self-pay

## 2020-08-03 ENCOUNTER — Ambulatory Visit: Payer: BC Managed Care – PPO

## 2020-08-17 ENCOUNTER — Ambulatory Visit (INDEPENDENT_AMBULATORY_CARE_PROVIDER_SITE_OTHER): Payer: BC Managed Care – PPO

## 2020-08-17 ENCOUNTER — Other Ambulatory Visit: Payer: Self-pay

## 2020-08-17 DIAGNOSIS — E291 Testicular hypofunction: Secondary | ICD-10-CM

## 2020-08-17 MED ORDER — TESTOSTERONE CYPIONATE 200 MG/ML IM SOLN
100.0000 mg | INTRAMUSCULAR | Status: DC
  Administered 2020-08-17: 100 mg via INTRAMUSCULAR

## 2020-08-17 NOTE — Progress Notes (Signed)
Patient presents to the office to receive his testosterone injection. Patient was given 0.54m of testosterone cypionate in his right glute and tolerated welll. Patient will return in 2 weeks to receive another 0.572m

## 2020-08-23 ENCOUNTER — Ambulatory Visit: Payer: BC Managed Care – PPO | Admitting: Physician Assistant

## 2020-08-23 ENCOUNTER — Encounter: Payer: Self-pay | Admitting: Physician Assistant

## 2020-08-23 ENCOUNTER — Other Ambulatory Visit: Payer: Self-pay

## 2020-08-23 ENCOUNTER — Ambulatory Visit (INDEPENDENT_AMBULATORY_CARE_PROVIDER_SITE_OTHER): Payer: BC Managed Care – PPO | Admitting: Physician Assistant

## 2020-08-23 DIAGNOSIS — D239 Other benign neoplasm of skin, unspecified: Secondary | ICD-10-CM

## 2020-08-23 DIAGNOSIS — D2371 Other benign neoplasm of skin of right lower limb, including hip: Secondary | ICD-10-CM

## 2020-08-23 DIAGNOSIS — R61 Generalized hyperhidrosis: Secondary | ICD-10-CM | POA: Diagnosis not present

## 2020-08-23 MED ORDER — DRYSOL 20 % EX SOLN: Freq: Every day | CUTANEOUS | 6 refills | Status: DC

## 2020-08-29 ENCOUNTER — Encounter: Payer: Self-pay | Admitting: Physician Assistant

## 2020-08-29 DIAGNOSIS — M4322 Fusion of spine, cervical region: Secondary | ICD-10-CM | POA: Diagnosis not present

## 2020-08-29 DIAGNOSIS — M4802 Spinal stenosis, cervical region: Secondary | ICD-10-CM | POA: Diagnosis not present

## 2020-08-29 DIAGNOSIS — Z981 Arthrodesis status: Secondary | ICD-10-CM | POA: Diagnosis not present

## 2020-08-29 DIAGNOSIS — M4712 Other spondylosis with myelopathy, cervical region: Secondary | ICD-10-CM | POA: Diagnosis not present

## 2020-08-29 NOTE — Progress Notes (Signed)
   Follow-Up Visit   Subjective  Mark Gallagher is a 50 y.o. male who presents for the following: Annual Exam (Patient here today for yearly skin check. Personal history of atypical moles. No personal history of melanoma or non mole skin cancer. Per patient he has a lesion above his right ankle x 6-9 months no bleeding, no pain, change in size. No family history of atypical moles, melanoma of non mole skin cancer. ). Patient is also experiencing excessive sweating mostly in his axillary region. He is self conscious about this condition and would like to start treatment.   The following portions of the chart were reviewed this encounter and updated as appropriate:  Tobacco  Allergies  Meds  Problems  Med Hx  Surg Hx  Fam Hx      Objective  Well appearing patient in no apparent distress; mood and affect are within normal limits.  A full examination was performed including scalp, head, eyes, ears, nose, lips, neck, chest, axillae, abdomen, back, buttocks, bilateral upper extremities, bilateral lower extremities, hands, feet, fingers, toes, fingernails, and toenails. All findings within normal limits unless otherwise noted below.  Right Lower Leg - Anterior Hyperpigmented dense plaque  Left Axilla, Right Axilla Profuse sweating  Assessment & Plan  Dermatofibroma Right Lower Leg - Anterior  observe  Excessive sweating (2) Left Axilla; Right Axilla  aluminum chloride (DRYSOL) 20 % external solution - Left Axilla, Right Axilla Apply topically at bedtime.   I, Nada Godley, PA-C, have reviewed all documentation's for this visit.  The documentation on 08/29/20 for the exam, diagnosis, procedures and orders are all accurate and complete.

## 2020-08-31 ENCOUNTER — Ambulatory Visit: Payer: BC Managed Care – PPO

## 2020-09-03 ENCOUNTER — Ambulatory Visit (INDEPENDENT_AMBULATORY_CARE_PROVIDER_SITE_OTHER): Payer: BC Managed Care – PPO

## 2020-09-03 ENCOUNTER — Other Ambulatory Visit: Payer: Self-pay

## 2020-09-03 DIAGNOSIS — E291 Testicular hypofunction: Secondary | ICD-10-CM

## 2020-09-03 MED ORDER — TESTOSTERONE CYPIONATE 200 MG/ML IM SOLN
200.0000 mg | INTRAMUSCULAR | Status: DC
  Administered 2020-09-03: 200 mg via INTRAMUSCULAR

## 2020-09-03 NOTE — Progress Notes (Signed)
Patient presents to the office to receive his testosterone injection. Patient was injected with 0.50m of cypionate in his left glute. Patient tolerated well. Patient will return in 2 weeks to receive another round of testosterone.

## 2020-09-17 ENCOUNTER — Other Ambulatory Visit: Payer: Self-pay

## 2020-09-17 ENCOUNTER — Ambulatory Visit (INDEPENDENT_AMBULATORY_CARE_PROVIDER_SITE_OTHER): Payer: BC Managed Care – PPO

## 2020-09-17 DIAGNOSIS — Z23 Encounter for immunization: Secondary | ICD-10-CM

## 2020-09-17 DIAGNOSIS — E291 Testicular hypofunction: Secondary | ICD-10-CM | POA: Diagnosis not present

## 2020-09-17 MED ORDER — TESTOSTERONE CYPIONATE 200 MG/ML IM SOLN
200.0000 mg | INTRAMUSCULAR | Status: DC
  Administered 2020-09-17: 200 mg via INTRAMUSCULAR

## 2020-09-17 NOTE — Progress Notes (Signed)
Patient presents to the office to receive his testosterone injection. Patient received 8m of testosterone in his right glute and tolerated well. Patient also received his first shingles vaccine and tolerated well. Patient will return the office for his next testosterone injection in two weeks.

## 2020-10-01 ENCOUNTER — Ambulatory Visit: Payer: BC Managed Care – PPO

## 2020-10-02 ENCOUNTER — Ambulatory Visit: Payer: BC Managed Care – PPO

## 2020-10-02 ENCOUNTER — Other Ambulatory Visit: Payer: Self-pay

## 2020-10-02 ENCOUNTER — Ambulatory Visit (INDEPENDENT_AMBULATORY_CARE_PROVIDER_SITE_OTHER): Payer: BC Managed Care – PPO | Admitting: Family Medicine

## 2020-10-02 DIAGNOSIS — E291 Testicular hypofunction: Secondary | ICD-10-CM

## 2020-10-02 MED ORDER — TESTOSTERONE CYPIONATE 200 MG/ML IM SOLN
100.0000 mg | INTRAMUSCULAR | Status: DC
  Administered 2020-10-02: 100 mg via INTRAMUSCULAR

## 2020-10-02 NOTE — Progress Notes (Signed)
Patient was given 0.5 ml of Testosterone in his R Glute. Tolerated well. Will return in 14 days for his next injection

## 2020-10-08 NOTE — Progress Notes (Signed)
Agree with order.   Signed,   [name], MD [place] 10/08/20 6:25 PM

## 2020-10-12 DIAGNOSIS — M545 Low back pain, unspecified: Secondary | ICD-10-CM | POA: Diagnosis not present

## 2020-10-16 ENCOUNTER — Ambulatory Visit (INDEPENDENT_AMBULATORY_CARE_PROVIDER_SITE_OTHER): Payer: BC Managed Care – PPO | Admitting: Family Medicine

## 2020-10-16 ENCOUNTER — Other Ambulatory Visit: Payer: Self-pay

## 2020-10-16 DIAGNOSIS — E291 Testicular hypofunction: Secondary | ICD-10-CM | POA: Diagnosis not present

## 2020-10-16 NOTE — Progress Notes (Signed)
Patient has came in for his testosterone shot in his RUQ per DR greene. Patient tolerated the medication well and will return in 14 days.

## 2020-10-17 NOTE — Progress Notes (Signed)
Mistake for the Testosterone shot is fixed.

## 2020-10-19 ENCOUNTER — Telehealth: Payer: Self-pay | Admitting: Cardiovascular Disease

## 2020-10-19 NOTE — Telephone Encounter (Signed)
Pt c/o medication issue:  1. Name of Medication: warfarin  2. How are you currently taking this medication (dosage and times per day)?   3. Are you having a reaction (difficulty breathing--STAT)? no  4. What is your medication issue? Patients wants to know if he could be put back on to the medication. Please advise

## 2020-10-19 NOTE — Telephone Encounter (Signed)
Returned the call to the patient. He stated that he was having problems with regulating the INR's for the coumadin in the past so was placed in a study with [facility] where he could be on the Eliquis. According to the patient, the study is now being ended due to clots being found in patients on the study. The patient would like to remain on the Eliquis and wanted to know if Dr. Gwenlyn Found would prescribe it instead of the warfarin.

## 2020-10-23 NOTE — Telephone Encounter (Signed)
Left a message for the patient to call back.  

## 2020-10-23 NOTE — Telephone Encounter (Signed)
Patient returning call. Patient states he would like to know if he can stay on the eliquis. He says he is also traveling so if he does not answer he would like a detailed message left.

## 2020-10-23 NOTE — Telephone Encounter (Signed)
Called pt he to tell him Dr. Kennon Holter recommendations. No answer at this time, left a message.

## 2020-10-24 NOTE — Telephone Encounter (Signed)
Patient returning call from yesterday

## 2020-10-24 NOTE — Telephone Encounter (Signed)
Patient made aware. Appointment made for 11/1 with Dr. Gwenlyn Found to discuss warfarin.

## 2020-10-30 ENCOUNTER — Ambulatory Visit (INDEPENDENT_AMBULATORY_CARE_PROVIDER_SITE_OTHER): Payer: BC Managed Care – PPO | Admitting: Family Medicine

## 2020-10-30 ENCOUNTER — Other Ambulatory Visit: Payer: Self-pay

## 2020-10-30 DIAGNOSIS — Z23 Encounter for immunization: Secondary | ICD-10-CM | POA: Diagnosis not present

## 2020-10-30 DIAGNOSIS — E291 Testicular hypofunction: Secondary | ICD-10-CM | POA: Diagnosis not present

## 2020-10-30 MED ORDER — TESTOSTERONE CYPIONATE 200 MG/ML IM SOLN
100.0000 mg | INTRAMUSCULAR | Status: DC
  Administered 2020-10-30 – 2021-08-09 (×3): 100 mg via INTRAMUSCULAR

## 2020-11-13 ENCOUNTER — Ambulatory Visit (INDEPENDENT_AMBULATORY_CARE_PROVIDER_SITE_OTHER): Payer: BC Managed Care – PPO | Admitting: Registered Nurse

## 2020-11-13 ENCOUNTER — Other Ambulatory Visit: Payer: Self-pay

## 2020-11-13 DIAGNOSIS — E291 Testicular hypofunction: Secondary | ICD-10-CM | POA: Diagnosis not present

## 2020-11-13 MED ORDER — TESTOSTERONE CYPIONATE 200 MG/ML IM SOLN
100.0000 mg | INTRAMUSCULAR | Status: DC
  Administered 2020-12-11 – 2021-02-27 (×6): 100 mg via INTRAMUSCULAR

## 2020-11-13 NOTE — Progress Notes (Addendum)
Patient presents to the office to receive his testosterone injection. Patient received 0.78ml of testosterone cypionate in his left glute. Patient tolerated well. Patient will return to office in two weeks to receive his next injection.

## 2020-11-20 ENCOUNTER — Ambulatory Visit (INDEPENDENT_AMBULATORY_CARE_PROVIDER_SITE_OTHER): Payer: BC Managed Care – PPO | Admitting: *Deleted

## 2020-11-20 ENCOUNTER — Ambulatory Visit (INDEPENDENT_AMBULATORY_CARE_PROVIDER_SITE_OTHER): Payer: BC Managed Care – PPO | Admitting: Cardiovascular Disease

## 2020-11-20 ENCOUNTER — Other Ambulatory Visit: Payer: Self-pay

## 2020-11-20 ENCOUNTER — Encounter: Payer: Self-pay | Admitting: Cardiovascular Disease

## 2020-11-20 DIAGNOSIS — Z5181 Encounter for therapeutic drug level monitoring: Secondary | ICD-10-CM

## 2020-11-20 DIAGNOSIS — E782 Mixed hyperlipidemia: Secondary | ICD-10-CM

## 2020-11-20 DIAGNOSIS — I7121 Aneurysm of the ascending aorta, without rupture: Secondary | ICD-10-CM | POA: Diagnosis not present

## 2020-11-20 DIAGNOSIS — E785 Hyperlipidemia, unspecified: Secondary | ICD-10-CM | POA: Insufficient documentation

## 2020-11-20 MED ORDER — WARFARIN SODIUM 5 MG PO TABS: ORAL_TABLET | ORAL | 0 refills | Status: DC

## 2020-11-20 NOTE — Patient Instructions (Signed)

## 2020-11-20 NOTE — Assessment & Plan Note (Signed)
History of hyperlipidemia on statin therapy followed by his endocrinologist.

## 2020-11-20 NOTE — Assessment & Plan Note (Signed)
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 Ysidro Evert at [facility] 10/14/2018.  Dr. Ysidro Evert follows him on an annual basis with 2D echo and MRIs.  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  He was enrolled in an apixaban trial for his prosthetic valve which apparently was terminated.  He will need to go back on warfarin oral anticoagulation.

## 2020-11-20 NOTE — Progress Notes (Signed)
11/20/2020 Mark Gallagher   08/10/1970  [id number]  Primary Physician Wendie Agreste, MD Primary Cardiologist: Lorretta Harp MD FACP, [place], [place], [place]  HPI:  Mark Gallagher is a 50 y.o.    thin and tall appearing single Caucasian male with no children who I last saw in the office 12/14/2019. He was referred by Dr. Shea Stakes at primary care [place] to be established in our Coumadin clinic.  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  He had thoracic aortic aneurysm resection and grafting with mechanical aortic valve replacement by Dr. Ysidro Evert is at [facility] 10/14/2018.  He is on Coumadin anticoagulation.  He is recovered nicely.  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   Since I saw him a year ago he is done well.  He is very active and has a goal of walking 450 miles a year as well on the way to accomplishing this.   He was on an apixaban trial for his St. Jude's valve which was terminated because of adverse events.  He will be transition back to warfarin oral anticoagulation.  Since I saw him he is remained asymptomatic.  Dr. Ysidro Evert sees him on an annual basis and checks an MRI and 2D echo.  Current Meds  Medication Sig   apixaban (ELIQUIS) 5 MG TABS tablet Take by mouth.   atorvastatin (LIPITOR) 40 MG tablet Take 40 mg by mouth daily.   Cholecalciferol (VITAMIN D3) 25 MCG (1000 UT) CAPS Take 2,000 Units by mouth daily.    eplerenone (INSPRA) 25 MG tablet Take 25 mg by mouth daily.   ferrous sulfate 325 (65 FE) MG tablet Take 1 tablet (325 mg total) by mouth 2 (two) times daily with a meal.   hydrocortisone (CORTEF) 10 MG tablet Take 1 tablet daily.  You may take 2 tablets daily with excessive weakness and tiredness and lethargy. (Patient taking differently: Take 10 mg by mouth daily.)   levothyroxine (SYNTHROID, LEVOTHROID) 200 MCG tablet Take 1 tablet (200 mcg total) by mouth daily.    metoprolol succinate (TOPROL-XL) 25 MG 24 hr tablet Take 25 mg by mouth daily.   Multiple Vitamins-Minerals (CENTRUM SILVER PO) Take 1 tablet by mouth daily.   polyethylene glycol (MIRALAX / GLYCOLAX) packet Take 17 g by mouth daily as needed for mild constipation.   SOMAVERT 20 MG SOLR Inject into the skin.   testosterone cypionate (DEPOTESTOSTERONE CYPIONATE) 200 MG/ML injection SMARTSIG:0.5 Milliliter(s) IM Every 2 Weeks   Current Facility-Administered Medications for the 11/20/20 encounter (Office Visit) with Lorretta Harp, MD  Medication   octreotide (SANDOSTATIN LAR) IM injection 30 mg   testosterone cypionate (DEPOTESTOSTERONE CYPIONATE) injection 100 mg   testosterone cypionate (DEPOTESTOSTERONE CYPIONATE) injection 100 mg   testosterone cypionate (DEPOTESTOSTERONE CYPIONATE) injection 100 mg   testosterone cypionate (DEPOTESTOSTERONE CYPIONATE) injection 100 mg   testosterone cypionate (DEPOTESTOSTERONE CYPIONATE) injection 100 mg   testosterone cypionate (DEPOTESTOSTERONE CYPIONATE) injection 100 mg   testosterone cypionate (DEPOTESTOSTERONE CYPIONATE) injection 200 mg   testosterone cypionate (DEPOTESTOSTERONE CYPIONATE) injection 200 mg   testosterone cypionate (DEPOTESTOSTERONE CYPIONATE) injection 200 mg   testosterone cypionate (DEPOTESTOSTERONE CYPIONATE) injection 200 mg   testosterone cypionate (DEPOTESTOSTERONE CYPIONATE) injection 200 mg     Allergies  Allergen Reactions   Oxycodone Nausea Only    Social History   Socioeconomic History   Marital status: Single  Spouse name: Not on file   Number of children: Not on file   Years of education: Not on file   Highest education level: Not on file  Occupational History   Not on file  Tobacco Use   Smoking status: Never   Smokeless tobacco: Never  Vaping Use   Vaping Use: Never used  Substance and Sexual Activity   Alcohol use: No   Drug use: No   Sexual activity: Not on file  Other Topics Concern   Not  on file  Social History Narrative   Not on file   Social Determinants of Health   Financial Resource Strain: Not on file  Food Insecurity: Not on file  Transportation Needs: Not on file  Physical Activity: Not on file  Stress: Not on file  Social Connections: Not on file  Intimate Partner Violence: Not on file     Review of Systems: General: negative for chills, fever, night sweats or weight changes.  Cardiovascular: negative for chest pain, dyspnea on exertion, edema, orthopnea, palpitations, paroxysmal nocturnal dyspnea or shortness of breath Dermatological: negative for rash Respiratory: negative for cough or wheezing Urologic: negative for hematuria Abdominal: negative for nausea, vomiting, diarrhea, bright red blood per rectum, melena, or hematemesis Neurologic: negative for visual changes, syncope, or dizziness All other systems reviewed and are otherwise negative except as noted above.    Blood pressure 122/82, pulse 66, height 6\' 6"  (1.981 m), weight 255 lb (115.7 kg), SpO2 96 %.  General appearance: alert and no distress Neck: no adenopathy, no JVD, supple, symmetrical, trachea midline, thyroid not enlarged, symmetric, no tenderness/mass/nodules, and bilateral carotid bruits versus transmitted murmur Lungs: clear to auscultation bilaterally Heart: Crisp prosthetic valve sounds with a soft outflow tract murmur. Extremities: extremities normal, atraumatic, no cyanosis or edema Pulses: 2+ and symmetric Skin: Skin color, texture, turgor normal. No rashes or lesions Neurologic: Grossly normal  EKG sinus rhythm at 66 with right bundle branch block.  I personally reviewed this EKG.  ASSESSMENT AND PLAN:   Hyperlipidemia History of hyperlipidemia on statin therapy followed by his endocrinologist.  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 Ysidro Evert at [facility] 10/14/2018.  Dr. Ysidro Evert follows him on an annual basis with 2D echo and MRIs.  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  He was enrolled in an apixaban trial for his prosthetic valve which apparently was terminated.  He will need to go back on warfarin oral anticoagulation.     Lorretta Harp MD FACP,FACC,FAHA, [facility] 11/20/2020 10:45 AM

## 2020-11-20 NOTE — Patient Instructions (Addendum)
Description   -Start taking warfarin 15mg  daily.  -Stop taking Eliquis 11/4 -Recheck INR in 1 week.  A full discussion of the nature of anticoagulants has been carried out.  A benefit risk analysis has been presented to the patient, so that they understand the justification for choosing anticoagulation at this time. The need for frequent and regular monitoring, precise dosage adjustment and compliance is stressed.  Side effects of potential bleeding are discussed.  The patient should avoid any OTC items containing aspirin or ibuprofen, and should avoid great swings in general diet.  Avoid alcohol consumption.  Call if any signs of abnormal bleeding.

## 2020-11-21 ENCOUNTER — Encounter: Payer: Self-pay | Admitting: Family Medicine

## 2020-11-21 ENCOUNTER — Telehealth (INDEPENDENT_AMBULATORY_CARE_PROVIDER_SITE_OTHER): Payer: BC Managed Care – PPO | Admitting: Family Medicine

## 2020-11-21 ENCOUNTER — Telehealth: Payer: Self-pay

## 2020-11-21 VITALS — BP 122/80 | HR 68

## 2020-11-21 DIAGNOSIS — D497 Neoplasm of unspecified behavior of endocrine glands and other parts of nervous system: Secondary | ICD-10-CM | POA: Insufficient documentation

## 2020-11-21 DIAGNOSIS — J069 Acute upper respiratory infection, unspecified: Secondary | ICD-10-CM

## 2020-11-21 NOTE — Telephone Encounter (Signed)
Caller name:Stephan Pagan   On DPR? :Yes  Call back number:[phone]  Provider they see: Carlota Raspberry   Reason for call:Pt called back he tested with negative covid test

## 2020-11-21 NOTE — Progress Notes (Signed)
Virtual Visit via audio Note  I connected with Mark Gallagher on 11/21/20 at 1:13 PM by audio - unable to connect by video. verified that I am speaking with the correct person using two identifiers.  Patient location: home. By self.  My location: office - [place]   I discussed the limitations, risks, security and privacy concerns of performing an evaluation and management service by telephone and the availability of in person appointments. I also discussed with the patient that there may be a patient responsible charge related to this service. The patient expressed understanding and agreed to proceed, consent obtained  Chief complaint:  Chief Complaint  Patient presents with   URI    Patient is feeling great but has a sore throat, runny nose and little bit of cough and congestion. Patient has taken some cough drops.    History of Present Illness: Mark Gallagher is a 50 y.o. male  URI: Started sore throat yesterday am - min runny nose, min congestion, min cough.  Able to go for walk yesterday. Not feeling bad. No fever, no dyspnea or CP, no bodyache.  Drinking fluids ok.  Sleeping ok. Rare cough.  Tx: cough drops. Salt water gargles. Cepacol.   Had covid vaccine and 1 booster in January.  No sick contacts, no home covid test.  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   Hyperlipidemia 11/20/2020   Thoracic aortic aneurysm 11/16/2018   Hiccups 10/17/2018   Thrombocytopenia ([place]) 10/14/2018   Dyslipidemia 10/11/2018   HTN (hypertension) 10/11/2018   Enlargement of aortic root ([place]) 04/06/2018   Chronic adrenal insufficiency ([place]) 03/26/2018   Hyponatremia 03/26/2018   Anasarca 03/26/2018   Hyperbilirubinemia 03/26/2018   Shock circulatory ([place])    Primary localized osteoarthritis of hip 03/16/2018   S/P total hip arthroplasty 03/16/2018   Hypothyroidism 02/22/2018   History of fusion of cervical spine 02/22/2018   Iron  deficiency anemia 02/22/2018   Acquired left foot drop 02/22/2018   Primary osteoarthritis of right hip 02/22/2018   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   Constipation 12/15/2016   Traumatic closed fracture of distal clavicle with minimal displacement, left, with routine healing, subsequent encounter 03/24/2016   Left knee pain 09/25/2015   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   moderate atypia - left post knee - widershave (free)   Atypical nevus 08/17/2014   moderate atypia - right mid back    Atypical nevus 12/20/2015   moderate to severe atypia - upper mid back - widershave   Atypical nevus 09/18/2017   mild atypia - left anterior back   Atypical nevus 09/18/2017   moderate atypia - left posterior back   Atypical nevus 07/16/2018   marked atypical - right neck, lower   Blood transfusion without reported diagnosis  Cancer [facility])    atypical mole - no cancer per pt    History of pituitary tumor    Hypertension    pt denies - on metoprol for heart    Hypothyroidism    Knee pain, bilateral    Pneumonia    Testosterone deficiency    Thyroid disease    Past Surgical History:  Procedure Laterality Date   CARDIAC ELECTROPHYSIOLOGY STUDY AND ABLATION     CERVICAL [place] SURGERY  2007   Anterior C4/5 vertebrectomy and c3-C6 laminoplasty with  plating   CRANIOTOMY FOR TUMOR     pituitary adenoma   SPINAL FUSION     2007, 2016 c3- t 2    THORACIC AORTIC ANEURYSM REPAIR     with Aortic valve replacement    TOTAL HIP ARTHROPLASTY Right 03/16/2018   Procedure: TOTAL HIP ARTHROPLASTY Anterior;  Surgeon: Renette Butters, MD;  Location: [facility] ORS;  Service: Orthopedics;  Laterality: Right;   Allergies  Allergen Reactions   Oxycodone Nausea Only   Prior to Admission medications   Medication Sig Start Date End Date Taking? Authorizing Provider  atorvastatin (LIPITOR) 40 MG tablet Take 40 mg by mouth daily.   Yes [provider]  Cholecalciferol (VITAMIN D3) 25 MCG (1000 UT) CAPS Take 2,000 Units by mouth daily.    Yes [provider]  eplerenone (INSPRA) 25 MG tablet Take 25 mg by mouth daily. 07/09/20  Yes [provider]  ferrous sulfate 325 (65 FE) MG tablet Take 1 tablet (325 mg total) by mouth 2 (two) times daily with a meal. 10/05/14  Yes Love, Ivan Anchors, PA-C  hydrocortisone (CORTEF) 10 MG tablet Take 1 tablet daily.  You may take 2 tablets daily with excessive weakness and tiredness and lethargy. Patient taking differently: Take 10 mg by mouth daily. 03/23/18  Yes Georgette Shell, MD  levothyroxine (SYNTHROID, LEVOTHROID) 200 MCG tablet Take 1 tablet (200 mcg total) by mouth daily. 10/05/14  Yes Love, Ivan Anchors, PA-C  metoprolol succinate (TOPROL-XL) 25 MG 24 hr tablet Take 25 mg by mouth daily. 06/13/19  Yes [provider]  Multiple Vitamins-Minerals (CENTRUM SILVER PO) Take 1 tablet by mouth daily.   Yes [provider]  polyethylene glycol (MIRALAX / GLYCOLAX) packet Take 17 g by mouth daily as needed for mild constipation.   Yes [provider]  SOMAVERT 20 MG SOLR Inject into the skin. 07/26/20  Yes [provider]  testosterone cypionate (DEPOTESTOSTERONE CYPIONATE) 200 MG/ML injection SMARTSIG:0.5 Milliliter(s) IM Every 2 Weeks 08/12/19  Yes [provider]   warfarin (COUMADIN) 5 MG tablet Take 3 tablets by mouth daily as directed by the coumadin clinic 11/20/20  Yes Lorretta Harp, MD  aluminum chloride (DRYSOL) 20 % external solution Apply topically at bedtime. Patient not taking: No sig reported 08/23/20   Warren Danes, PA-C   Social History   Socioeconomic History   Marital status: Single    Spouse name: Not on file   Number of children: Not on file   Years of education: Not on file   Highest education level: Not on file  Occupational History   Not on file  Tobacco Use   Smoking status: Never   Smokeless tobacco: Never  Vaping Use   Vaping Use: Never used  Substance and Sexual Activity   Alcohol use: No   Drug use: No   Sexual activity: Not on file  Other Topics Concern   Not on file  Social  History Narrative   Not on file   Social Determinants of Health   Financial Resource Strain: Not on file  Food Insecurity: Not on file  Transportation Needs: Not on file  Physical Activity: Not on file  Stress: Not on file  Social Connections: Not on file  Intimate Partner Violence: Not on file    Observations/Objective: Vitals:   11/21/20 1254  BP: 122/80  Pulse: 68    No distress on phone, speaking full sentences, no audible wheeze or stridor.  No cough.  Appropriate responses.  Assessment and Plan: Upper respiratory tract infection, unspecified type Suspected viral upper respiratory infection, minimal symptoms.  Continue symptomatic care with Cepacol, fluids, rest, option of Tessalon Perles can be called in if more cough.  Plan for home COVID testing today, repeat in 36 hours and advise me of results although less likely COVID infection.  Urgent care/recheck precautions discussed.  Follow Up Instructions: As needed with RTC/urgent care precautions discussed   I discussed the assessment and treatment plan with the patient. The patient was provided an opportunity to ask questions and all were answered. The patient  agreed with the plan and demonstrated an understanding of the instructions.   The patient was advised to call back or seek an in-person evaluation if the symptoms worsen or if the condition fails to improve as anticipated.  I provided 9 minutes of non-face-to-face time during this encounter.   Wendie Agreste, MD

## 2020-11-21 NOTE — Progress Notes (Signed)
I connected with  Mark Gallagher on 11/21/20 by a video enabled telemedicine application and verified that I am speaking with the correct person using two identifiers.   I discussed the limitations of evaluation and management by telemedicine. The patient expressed understanding and agreed to proceed.

## 2020-11-22 ENCOUNTER — Telehealth: Payer: Self-pay

## 2020-11-22 NOTE — Telephone Encounter (Signed)
Caller name:Afnan Shira   On DPR? :Yes  Call back number:[phone]  Provider they see: Carlota Raspberry  Reason for call:Nazair called back with second Covid test that is negative and he said it was like getting in his chest now and wanted to see about something to called in pt is still coughing, runny nose and sore throat.

## 2020-11-23 ENCOUNTER — Telehealth: Payer: Self-pay

## 2020-11-23 DIAGNOSIS — R059 Cough, unspecified: Secondary | ICD-10-CM

## 2020-11-23 MED ORDER — BENZONATATE 100 MG PO CAPS: 100.0000 mg | ORAL_CAPSULE | Freq: Three times a day (TID) | ORAL | 0 refills | Status: DC | PRN

## 2020-11-23 NOTE — Telephone Encounter (Signed)
Message noted. Virtual visit 2 days ago.  At that time had started with a slight sore throat the day prior with minimal runny nose, congestion, cough.  No fever, dyspnea, chest pain or body ache.  Rare cough.  Suspected viral illness, early symptoms.  Recommended COVID testing, but unlikely COVID infection.  Continued symptomatic care discussed, and option of Tessalon Perles if needed if more persistent cough. I did receive message yesterday that he tested negative for COVID. Call yesterday afternoon, routed to me this morning.  Repeat test negative. More cough, and request for medication.  Called patient: Sore throat improved. Some increased cough - clear phlegm. No fever, no dyspnea,  some nasal congestion, runny nose. Some chest congestion in the morning, improves during the day. no chest pain. Feeling a little better today. More active today.   Tx: lozenges, water.   Encouraged about some of his improvement today and minimal chest congestion that improves during the day.  Unlikely bacterial illness at this time, and discussed reasons for not starting antibiotic at this time.  Recommended mucinex otc, tessalon perles sent to pharmacy.  Rest, activity as tolerated with slow resumption of activity.  Update by MyChart or phone call on Monday but ER/urgent care precautions given over the weekend.  Understanding of plan expressed.

## 2020-11-23 NOTE — Telephone Encounter (Signed)
See other call notes.

## 2020-11-23 NOTE — Telephone Encounter (Signed)
Mark Gallagher has called upset as he has reported negative COVID tests to the office and has been requesting abx last 2 days. See phone note 11/2 and 11/3 for record. Please review pt notes worsening and settling into his chest

## 2020-11-23 NOTE — Telephone Encounter (Signed)
Patient called back and would like an antibiotic called in because is still has the sore throat and he tested negative again for covid.  Patient would like for someone to call him back and let him know if the antibiotic is being called in.  Please advise.

## 2020-11-27 ENCOUNTER — Ambulatory Visit (INDEPENDENT_AMBULATORY_CARE_PROVIDER_SITE_OTHER): Payer: BC Managed Care – PPO | Admitting: Family Medicine

## 2020-11-27 DIAGNOSIS — E291 Testicular hypofunction: Secondary | ICD-10-CM

## 2020-11-28 ENCOUNTER — Telehealth: Payer: Self-pay | Admitting: Family Medicine

## 2020-11-28 NOTE — Telephone Encounter (Signed)
..  Caller name:  Johnchristopher   On DPR? :yes/no: Yes  Call back number:[phone]  Provider they see: Carlota Raspberry  Reason for call: Patient states that he mentioned to nurse yesterday that he was still not feeling well and would like to know what Dr. Carlota Raspberry suggests

## 2020-11-29 ENCOUNTER — Ambulatory Visit (INDEPENDENT_AMBULATORY_CARE_PROVIDER_SITE_OTHER): Payer: BC Managed Care – PPO

## 2020-11-29 ENCOUNTER — Ambulatory Visit (INDEPENDENT_AMBULATORY_CARE_PROVIDER_SITE_OTHER): Payer: BC Managed Care – PPO | Admitting: Family Medicine

## 2020-11-29 ENCOUNTER — Other Ambulatory Visit: Payer: Self-pay

## 2020-11-29 ENCOUNTER — Encounter: Payer: Self-pay | Admitting: Family Medicine

## 2020-11-29 VITALS — BP 118/72 | HR 60 | Temp 98.0°F | Resp 16

## 2020-11-29 DIAGNOSIS — B9689 Other specified bacterial agents as the cause of diseases classified elsewhere: Secondary | ICD-10-CM

## 2020-11-29 DIAGNOSIS — Z5181 Encounter for therapeutic drug level monitoring: Secondary | ICD-10-CM | POA: Diagnosis not present

## 2020-11-29 DIAGNOSIS — J329 Chronic sinusitis, unspecified: Secondary | ICD-10-CM

## 2020-11-29 LAB — POCT INR: INR: 3.1 — AB (ref 2.0–3.0)

## 2020-11-29 MED ORDER — PROMETHAZINE-DM 6.25-15 MG/5ML PO SYRP: 5.0000 mL | ORAL_SOLUTION | Freq: Four times a day (QID) | ORAL | 0 refills | Status: DC | PRN

## 2020-11-29 MED ORDER — AMOXICILLIN 875 MG PO TABS: 875.0000 mg | ORAL_TABLET | Freq: Two times a day (BID) | ORAL | 0 refills | Status: AC

## 2020-11-29 NOTE — Telephone Encounter (Signed)
I called patient.  He was seen by Dr. Birdie Riddle today, started on medication for sinusitis and medicine for cough.  Appreciative of her care.  No other concerns at this time.

## 2020-11-29 NOTE — Patient Instructions (Signed)
Hold tomorrow and the decrease to 3 tablets Daily, except 2 tablets on Wednesday.  INR 3 weeks

## 2020-11-29 NOTE — Progress Notes (Signed)
   Subjective:    Patient ID: Mark Gallagher, male    DOB: Jun 13, 1970, 50 y.o.   MRN: [id number]  HPI URI- pt was seen via video visit on 11/2.  Dx'd w/ URI.  COVID (-) x2.  Sore throat has improved.  + nasal congestion, cough.  Pt has had 3 bad nights this week due to cough.  Nasal drainage/congestion has been green.  Tm 99.  Sxs will wax and wane- 'at times I feel really rotten but then at times I feel fine'.  Denies sinus pain.  No ear pain.  No SOB or wheezing w/ cough.  This is day 10 of sxs.  Pt has been walking regularly.  Pt is concerned b/c he has hx of PNA   Review of Systems For ROS see HPI   This visit occurred during the SARS-CoV-2 public health emergency.  Safety protocols were in place, including screening questions prior to the visit, additional usage of staff PPE, and extensive cleaning of exam room while observing appropriate contact time as indicated for disinfecting solutions.      Objective:   Physical Exam Vitals reviewed.  Constitutional:      General: He is not in acute distress.    Appearance: He is not ill-appearing.  HENT:     Head:     Comments: Facies consistent w/ acromegaly    Right Ear: Tympanic membrane and ear canal normal. There is no impacted cerumen.     Left Ear: Tympanic membrane and ear canal normal. There is no impacted cerumen.     Nose: Congestion present.     Comments: TTP over R frontal and R maxillary sinuses Eyes:     Conjunctiva/sclera: Conjunctivae normal.     Pupils: Pupils are equal, round, and reactive to light.  Pulmonary:     Effort: Pulmonary effort is normal. No respiratory distress.     Breath sounds: Normal breath sounds. No wheezing or rales.     Comments: Wet cough Skin:    General: Skin is warm and dry.  Neurological:     Mental Status: He is alert and oriented to person, place, and time.  Psychiatric:        Mood and Affect: Mood normal.        Behavior: Behavior normal.        Thought Content: Thought content  normal.          Assessment & Plan:  Bacterial sinusitis- new.  Reviewed note from 11/2 and subsequent phone notes.  Pts illness is on day 10 and now developing sxs of sinusitis.  Suspect the cough and congestion are viral but he now may have a superimposed bacterial infxn.  Start Amox.  Cough syrup prn.  Reviewed supportive care and red flags that should prompt return.  Pt expressed understanding and is in agreement w/ plan.

## 2020-11-29 NOTE — Patient Instructions (Addendum)
Follow up as needed or as scheduled START the Amoxicillin twice daily- take w/ food Drink LOTS of fluids REST! Use the promethazine cough syrup as needed for cough and sleep You can use Robitussin or Delsym in between the prescription cough syrup Call with any questions or concerns Hang in there!

## 2020-12-06 NOTE — Progress Notes (Signed)
Pt received testosterone injection without issue, will return 1 month

## 2020-12-11 ENCOUNTER — Ambulatory Visit (INDEPENDENT_AMBULATORY_CARE_PROVIDER_SITE_OTHER): Payer: BC Managed Care – PPO | Admitting: Family Medicine

## 2020-12-11 DIAGNOSIS — E291 Testicular hypofunction: Secondary | ICD-10-CM | POA: Diagnosis not present

## 2020-12-11 NOTE — Progress Notes (Addendum)
Mark Gallagher is a 50 y.o. male presents to the office today for testosterone injections, per physician's orders.  [name]

## 2020-12-20 ENCOUNTER — Ambulatory Visit (INDEPENDENT_AMBULATORY_CARE_PROVIDER_SITE_OTHER): Payer: BC Managed Care – PPO

## 2020-12-20 ENCOUNTER — Other Ambulatory Visit: Payer: Self-pay

## 2020-12-20 DIAGNOSIS — Z5181 Encounter for therapeutic drug level monitoring: Secondary | ICD-10-CM | POA: Diagnosis not present

## 2020-12-20 LAB — POCT INR: INR: 2.1 (ref 2.0–3.0)

## 2020-12-20 NOTE — Patient Instructions (Signed)
Continue taking 3 tablets Daily, except 2 tablets on Wednesday.  INR 4 weeks. Eat greens tonight.

## 2020-12-24 DIAGNOSIS — Z7901 Long term (current) use of anticoagulants: Secondary | ICD-10-CM | POA: Diagnosis not present

## 2020-12-24 DIAGNOSIS — E890 Postprocedural hypothyroidism: Secondary | ICD-10-CM | POA: Diagnosis not present

## 2020-12-24 DIAGNOSIS — E291 Testicular hypofunction: Secondary | ICD-10-CM | POA: Diagnosis not present

## 2020-12-24 DIAGNOSIS — E220 Acromegaly and pituitary gigantism: Secondary | ICD-10-CM | POA: Diagnosis not present

## 2020-12-24 DIAGNOSIS — E2740 Unspecified adrenocortical insufficiency: Secondary | ICD-10-CM | POA: Diagnosis not present

## 2020-12-24 DIAGNOSIS — Z79890 Hormone replacement therapy (postmenopausal): Secondary | ICD-10-CM | POA: Diagnosis not present

## 2020-12-24 DIAGNOSIS — Z79899 Other long term (current) drug therapy: Secondary | ICD-10-CM | POA: Diagnosis not present

## 2020-12-25 ENCOUNTER — Ambulatory Visit (INDEPENDENT_AMBULATORY_CARE_PROVIDER_SITE_OTHER): Payer: BC Managed Care – PPO | Admitting: Family Medicine

## 2020-12-25 DIAGNOSIS — E291 Testicular hypofunction: Secondary | ICD-10-CM | POA: Diagnosis not present

## 2020-12-25 NOTE — Progress Notes (Signed)
Mark Gallagher is a 50 y.o. male presents to the office today for testosterone injections, per physician's orders.Patient tolerated well.  [name]

## 2021-01-07 ENCOUNTER — Telehealth (INDEPENDENT_AMBULATORY_CARE_PROVIDER_SITE_OTHER): Payer: BC Managed Care – PPO | Admitting: Family Medicine

## 2021-01-07 ENCOUNTER — Other Ambulatory Visit: Payer: Self-pay

## 2021-01-07 VITALS — Temp 100.0°F | Ht 78.0 in | Wt 245.0 lb

## 2021-01-07 DIAGNOSIS — B349 Viral infection, unspecified: Secondary | ICD-10-CM

## 2021-01-07 MED ORDER — PROMETHAZINE HCL 25 MG PO TABS: 25.0000 mg | ORAL_TABLET | Freq: Four times a day (QID) | ORAL | 0 refills | Status: DC | PRN

## 2021-01-07 NOTE — Progress Notes (Signed)
Virtual Visit via Video Note  Subjective  CC:  Chief Complaint  Patient presents with   Headache    Ongoing since 12/16, tested negative for covid 12/18   Emesis    Cannot keep anything down   Chills   Cough   Nasal Congestion    Had a 100.0 fever    Same day acute visit; PCP not available. New pt to me. Chart reviewed.   I connected with [name] on 01/07/21 at  3:30 PM EST by a video enabled telemedicine application and verified that I am speaking with the correct person using two identifiers. Location patient: Home Location provider: [facility] [place], Office Persons participating in the virtual visit: BANNER HUCKABA, Leamon Arnt, MD [place]  I discussed the limitations of evaluation and management by telemedicine and the availability of in person appointments. The patient expressed understanding and agreed to proceed. HPI: Mark Gallagher is a 50 y.o. male who was contacted today to address the problems listed above in the chief complaint. 50 year old male with history of pituitary tumor, hypothyroidism, hypogonadism, cortisol insufficiency on long-term anticoagulation presents via telehealth due to the above-noted symptoms.  He reports he noted symptoms 4 days ago, started with headache and congestion.  Has had low-grade fevers treated with Tylenol, nausea vomiting without diarrhea, no abdominal pain, cough and congestion with minimal sore throat.  He feels tired and weak but denies lightheadedness or palpitations.  No shortness of breath.  He is vaccinated for COVID and flu.  His mother had a similar illness but has not had GI symptoms.  He is unable to keep down much food at this time.  Sipping on beverages.  He took COVID test x2 and both were negative.  He denies urinary symptoms.  Assessment  1. Viral syndrome      Plan  Viral syndrome: Differential includes flu or other virus.  He denies orthostatic symptoms.  He is at risk  for dehydration.  Education given.  Recommend Phenergan and working on taking in fluids with some nutrition.  Monitor over the next 24 to 48 hours, if not improving, needs in person visit here or at higher level of care.  Recommend supportive care for cough and cold symptoms.  He may use Tylenol for low-grade fevers.  If he develops any worsening symptoms, abdominal pain, hematemesis or lightheadedness, he will seek in person care. I discussed the assessment and treatment plan with the patient. The patient was provided an opportunity to ask questions and all were answered. The patient agreed with the plan and demonstrated an understanding of the instructions.   The patient was advised to call back or seek an in-person evaluation if the symptoms worsen or if the condition fails to improve as anticipated. Follow up: As needed Visit date not found  Meds ordered this encounter  Medications   promethazine (PHENERGAN) 25 MG tablet    Sig: Take 1 tablet (25 mg total) by mouth every 6 (six) hours as needed for nausea or vomiting.    Dispense:  30 tablet    Refill:  0      I reviewed the patients updated PMH, FH, and SocHx.    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   Hyperlipidemia 11/20/2020   Thoracic aortic aneurysm 11/16/2018   Hiccups 10/17/2018   Thrombocytopenia ([place]) 10/14/2018   Dyslipidemia 10/11/2018   HTN (hypertension) 10/11/2018  Enlargement of aortic root ([place]) 04/06/2018   Chronic adrenal insufficiency (H[facility]) 03/26/2018   Hyponatremia 03/26/2018   Anasarca 03/26/2018   Hyperbilirubinemia 03/26/2018   Shock circulatory ([place])    Primary localized osteoarthritis of hip 03/16/2018   S/P total hip arthroplasty 03/16/2018   Hypothyroidism 02/22/2018   History of fusion of cervical spine 02/22/2018   Iron deficiency anemia 02/22/2018   Acquired left foot drop 02/22/2018   Primary osteoarthritis of right hip 02/22/2018   AVNRT (AV nodal  re-entry tachycardia) ([place]) 02/22/2018   Constipation 12/15/2016   Traumatic closed fracture of distal clavicle with minimal displacement, left, with routine healing, subsequent encounter 03/24/2016   Left knee pain 09/25/2015   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   atorvastatin (LIPITOR) 40 MG tablet Take 40 mg by mouth daily.   Cholecalciferol (VITAMIN D3) 25 MCG (1000 UT) CAPS Take 2,000 Units by mouth daily.    eplerenone (INSPRA) 25 MG tablet Take 25 mg by mouth daily.   ferrous sulfate 325 (65 FE) MG tablet Take 1 tablet (325 mg total) by mouth 2 (two) times daily with a meal.   hydrocortisone (CORTEF) 10 MG tablet Take 1 tablet daily.  You may take 2 tablets daily with excessive weakness and tiredness and lethargy. (Patient taking differently: Take 10 mg by mouth daily.)   levothyroxine (SYNTHROID, LEVOTHROID) 200 MCG tablet Take 1 tablet (200 mcg total) by mouth daily.   metoprolol succinate (TOPROL-XL) 25 MG 24 hr tablet Take 25 mg by mouth daily.   Multiple Vitamins-Minerals (CENTRUM SILVER PO) Take 1 tablet by mouth daily.   polyethylene glycol (MIRALAX / GLYCOLAX) packet Take 17 g by mouth daily as needed for mild constipation.   promethazine (PHENERGAN) 25 MG tablet Take 1 tablet (25 mg total) by mouth every 6 (six) hours as needed for nausea or vomiting.   SOMAVERT 20 MG SOLR Inject into the skin.   testosterone cypionate  (DEPOTESTOSTERONE CYPIONATE) 200 MG/ML injection SMARTSIG:0.5 Milliliter(s) IM Every 2 Weeks   warfarin (COUMADIN) 5 MG tablet Take 3 tablets by mouth daily as directed by the coumadin clinic   Current Facility-Administered Medications for the 01/07/21 encounter (Video Visit) with Leamon Arnt, MD  Medication   octreotide (SANDOSTATIN LAR) IM injection 30 mg   testosterone cypionate (DEPOTESTOSTERONE CYPIONATE) injection 100 mg   testosterone cypionate (DEPOTESTOSTERONE CYPIONATE) injection 100 mg   testosterone cypionate (DEPOTESTOSTERONE CYPIONATE) injection 100 mg   testosterone cypionate (DEPOTESTOSTERONE CYPIONATE) injection 100 mg   testosterone cypionate (DEPOTESTOSTERONE CYPIONATE) injection 100 mg   testosterone cypionate (DEPOTESTOSTERONE CYPIONATE) injection 100 mg   testosterone cypionate (DEPOTESTOSTERONE CYPIONATE) injection 200 mg   testosterone cypionate (DEPOTESTOSTERONE CYPIONATE) injection 200 mg   testosterone cypionate (DEPOTESTOSTERONE CYPIONATE) injection 200 mg   testosterone cypionate (DEPOTESTOSTERONE CYPIONATE) injection 200 mg   testosterone cypionate (DEPOTESTOSTERONE CYPIONATE) injection 200 mg    Allergies: Patient is allergic to oxycodone. Family History: Patient family history includes Acute myelogenous leukemia in his brother; Colon  polyps in his father; High blood pressure in his mother. Social History:  Patient  reports that he has never smoked. He has never used smokeless tobacco. He reports that he does not drink alcohol and does not use drugs.  Review of Systems: Constitutional: Negative for fever malaise or anorexia Cardiovascular: negative for chest pain Respiratory: negative for SOB or persistent cough Gastrointestinal: negative for abdominal pain  OBJECTIVE Vitals: Temp 100 F (37.8 C) (Temporal)    Ht 6\' 6"  (1.981 m)    Wt 245 lb (111.1 kg)    BMI 28.31 kg/m  General: no acute distress , A&Ox3, nontoxic-appearing no respiratory  distress  Leamon Arnt, MD

## 2021-01-08 ENCOUNTER — Ambulatory Visit: Payer: BC Managed Care – PPO

## 2021-01-10 ENCOUNTER — Encounter: Payer: Self-pay | Admitting: Family Medicine

## 2021-01-10 ENCOUNTER — Telehealth: Payer: Self-pay | Admitting: Family Medicine

## 2021-01-10 DIAGNOSIS — R059 Cough, unspecified: Secondary | ICD-10-CM

## 2021-01-10 DIAGNOSIS — B349 Viral infection, unspecified: Secondary | ICD-10-CM

## 2021-01-10 MED ORDER — BENZONATATE 100 MG PO CAPS: 100.0000 mg | ORAL_CAPSULE | Freq: Three times a day (TID) | ORAL | 0 refills | Status: DC | PRN

## 2021-01-10 NOTE — Telephone Encounter (Signed)
Noted virtual visit with Dr. Jonni Sanger.  Called patient.  Vomiting, body aches and sore throat have improved. 2 negative covid tests, did not have flu testing.  Still some cough/congestion. Min runny nose. Eating and drinking ok.  Taking robitussin cough/congestion past 3 days. Some cough at night, but sleeping overall going well.   Mucinex or mucinex DM for cough/congestion - has at home.  Tessalon perles sent if needed.

## 2021-01-10 NOTE — Telephone Encounter (Signed)
pt had a vv monday with andy, camille and currently still experiencing coughing and congestion. pt would like meds prescribed and sent to pharmacy. please advise.

## 2021-01-10 NOTE — Telephone Encounter (Signed)
Please advise 

## 2021-01-16 ENCOUNTER — Ambulatory Visit (INDEPENDENT_AMBULATORY_CARE_PROVIDER_SITE_OTHER): Payer: BC Managed Care – PPO | Admitting: Family Medicine

## 2021-01-16 DIAGNOSIS — Z23 Encounter for immunization: Secondary | ICD-10-CM | POA: Diagnosis not present

## 2021-01-16 DIAGNOSIS — E291 Testicular hypofunction: Secondary | ICD-10-CM

## 2021-01-16 NOTE — Progress Notes (Signed)
Pt was administered the testosterone injection to the Left upper outer quadrant without complication

## 2021-01-17 ENCOUNTER — Other Ambulatory Visit: Payer: Self-pay

## 2021-01-17 ENCOUNTER — Ambulatory Visit (INDEPENDENT_AMBULATORY_CARE_PROVIDER_SITE_OTHER): Payer: BC Managed Care – PPO | Admitting: *Deleted

## 2021-01-17 DIAGNOSIS — Z5181 Encounter for therapeutic drug level monitoring: Secondary | ICD-10-CM

## 2021-01-17 DIAGNOSIS — L97521 Non-pressure chronic ulcer of other part of left foot limited to breakdown of skin: Secondary | ICD-10-CM | POA: Diagnosis not present

## 2021-01-17 LAB — POCT INR: INR: 3.7 — AB (ref 2.0–3.0)

## 2021-01-17 MED ORDER — WARFARIN SODIUM 5 MG PO TABS: ORAL_TABLET | ORAL | 0 refills | Status: DC

## 2021-01-17 NOTE — Patient Instructions (Signed)
Description   Hold warfarin 12/30 and 12/31 and then START taking warfarin 15 mg daily except for 10 mg on Tuesday and Saturdays. Recheck INR 2 weeks. [facility] [phone].

## 2021-01-30 ENCOUNTER — Ambulatory Visit (INDEPENDENT_AMBULATORY_CARE_PROVIDER_SITE_OTHER): Payer: BC Managed Care – PPO | Admitting: Family Medicine

## 2021-01-30 DIAGNOSIS — E291 Testicular hypofunction: Secondary | ICD-10-CM

## 2021-01-30 NOTE — Progress Notes (Signed)
Pt was given 0.5 ml testosterone injection, well tolerated, this was last dose from this vial pt was advised to pick up another vial before next visit.

## 2021-01-31 ENCOUNTER — Ambulatory Visit (INDEPENDENT_AMBULATORY_CARE_PROVIDER_SITE_OTHER): Payer: BC Managed Care – PPO

## 2021-01-31 ENCOUNTER — Other Ambulatory Visit: Payer: Self-pay

## 2021-01-31 DIAGNOSIS — Z5181 Encounter for therapeutic drug level monitoring: Secondary | ICD-10-CM

## 2021-01-31 LAB — POCT INR: INR: 2.4 (ref 2.0–3.0)

## 2021-01-31 NOTE — Patient Instructions (Signed)
TAKE 2 TABLETS Friday only and then continue warfarin 3 tablets daily except for 2 tablets on Tuesday and Saturdays. Recheck INR 4 weeks. [facility] [phone].

## 2021-02-01 DIAGNOSIS — Z86018 Personal history of other benign neoplasm: Secondary | ICD-10-CM | POA: Diagnosis not present

## 2021-02-01 DIAGNOSIS — E039 Hypothyroidism, unspecified: Secondary | ICD-10-CM | POA: Diagnosis not present

## 2021-02-01 DIAGNOSIS — Z923 Personal history of irradiation: Secondary | ICD-10-CM | POA: Diagnosis not present

## 2021-02-01 DIAGNOSIS — Z8679 Personal history of other diseases of the circulatory system: Secondary | ICD-10-CM | POA: Diagnosis not present

## 2021-02-01 DIAGNOSIS — I7789 Other specified disorders of arteries and arterioles: Secondary | ICD-10-CM | POA: Diagnosis not present

## 2021-02-01 DIAGNOSIS — I081 Rheumatic disorders of both mitral and tricuspid valves: Secondary | ICD-10-CM | POA: Diagnosis not present

## 2021-02-01 DIAGNOSIS — Z79890 Hormone replacement therapy (postmenopausal): Secondary | ICD-10-CM | POA: Diagnosis not present

## 2021-02-01 DIAGNOSIS — Z7901 Long term (current) use of anticoagulants: Secondary | ICD-10-CM | POA: Diagnosis not present

## 2021-02-01 DIAGNOSIS — Z952 Presence of prosthetic heart valve: Secondary | ICD-10-CM | POA: Diagnosis not present

## 2021-02-01 DIAGNOSIS — Z9889 Other specified postprocedural states: Secondary | ICD-10-CM | POA: Diagnosis not present

## 2021-02-01 DIAGNOSIS — Z79899 Other long term (current) drug therapy: Secondary | ICD-10-CM | POA: Diagnosis not present

## 2021-02-01 DIAGNOSIS — Z48812 Encounter for surgical aftercare following surgery on the circulatory system: Secondary | ICD-10-CM | POA: Diagnosis not present

## 2021-02-13 ENCOUNTER — Ambulatory Visit (INDEPENDENT_AMBULATORY_CARE_PROVIDER_SITE_OTHER): Payer: BC Managed Care – PPO | Admitting: Family Medicine

## 2021-02-13 DIAGNOSIS — E291 Testicular hypofunction: Secondary | ICD-10-CM

## 2021-02-13 NOTE — Progress Notes (Addendum)
Pt was administered 0.5 ml per his bi weekly dosing instructions

## 2021-02-27 ENCOUNTER — Ambulatory Visit (INDEPENDENT_AMBULATORY_CARE_PROVIDER_SITE_OTHER): Payer: BC Managed Care – PPO | Admitting: Family Medicine

## 2021-02-27 DIAGNOSIS — E291 Testicular hypofunction: Secondary | ICD-10-CM | POA: Diagnosis not present

## 2021-02-27 NOTE — Progress Notes (Signed)
Pt presented to the office for his testosterone injection, no concerns, administered without complication.

## 2021-02-28 ENCOUNTER — Ambulatory Visit (INDEPENDENT_AMBULATORY_CARE_PROVIDER_SITE_OTHER): Payer: BC Managed Care – PPO

## 2021-02-28 ENCOUNTER — Other Ambulatory Visit: Payer: Self-pay

## 2021-02-28 DIAGNOSIS — Z5181 Encounter for therapeutic drug level monitoring: Secondary | ICD-10-CM | POA: Diagnosis not present

## 2021-02-28 LAB — POCT INR: INR: 1.7 — AB (ref 2.0–3.0)

## 2021-02-28 NOTE — Patient Instructions (Signed)
continue warfarin 3 tablets daily except for 2 tablets on Tuesday and Saturdays. Recheck INR 6 weeks. [facility] [phone].

## 2021-03-13 ENCOUNTER — Ambulatory Visit (INDEPENDENT_AMBULATORY_CARE_PROVIDER_SITE_OTHER): Payer: BC Managed Care – PPO | Admitting: Family Medicine

## 2021-03-13 DIAGNOSIS — E291 Testicular hypofunction: Secondary | ICD-10-CM

## 2021-03-13 MED ORDER — TESTOSTERONE CYPIONATE 200 MG/ML IM SOLN
100.0000 mg | INTRAMUSCULAR | Status: DC
  Administered 2021-03-13 – 2021-07-12 (×9): 100 mg via INTRAMUSCULAR

## 2021-03-13 NOTE — Progress Notes (Signed)
Mark Gallagher is a 51 y.o. male presents to the office today for testosterone injection [name]

## 2021-03-22 DIAGNOSIS — I483 Typical atrial flutter: Secondary | ICD-10-CM | POA: Diagnosis not present

## 2021-03-22 DIAGNOSIS — I428 Other cardiomyopathies: Secondary | ICD-10-CM | POA: Diagnosis not present

## 2021-03-22 DIAGNOSIS — I471 Supraventricular tachycardia: Secondary | ICD-10-CM | POA: Diagnosis not present

## 2021-03-22 DIAGNOSIS — I10 Essential (primary) hypertension: Secondary | ICD-10-CM | POA: Diagnosis not present

## 2021-03-27 ENCOUNTER — Ambulatory Visit: Payer: BC Managed Care – PPO

## 2021-03-28 ENCOUNTER — Ambulatory Visit (INDEPENDENT_AMBULATORY_CARE_PROVIDER_SITE_OTHER): Payer: BC Managed Care – PPO | Admitting: Family Medicine

## 2021-03-28 DIAGNOSIS — E291 Testicular hypofunction: Secondary | ICD-10-CM

## 2021-03-28 NOTE — Progress Notes (Signed)
Pt here for injection notes no issues with previous injections tolerated well  ?

## 2021-04-09 DIAGNOSIS — M71572 Other bursitis, not elsewhere classified, left ankle and foot: Secondary | ICD-10-CM | POA: Diagnosis not present

## 2021-04-11 ENCOUNTER — Other Ambulatory Visit: Payer: Self-pay

## 2021-04-11 ENCOUNTER — Ambulatory Visit (INDEPENDENT_AMBULATORY_CARE_PROVIDER_SITE_OTHER): Payer: BC Managed Care – PPO

## 2021-04-11 DIAGNOSIS — Z5181 Encounter for therapeutic drug level monitoring: Secondary | ICD-10-CM

## 2021-04-11 LAB — POCT INR: INR: 1.5 — AB (ref 2.0–3.0)

## 2021-04-11 NOTE — Patient Instructions (Signed)
continue warfarin 3 tablets daily except for 2 tablets on Tuesday and Saturdays. Recheck INR 6 weeks. [facility] Clinic [phone].  ?

## 2021-04-12 ENCOUNTER — Ambulatory Visit (INDEPENDENT_AMBULATORY_CARE_PROVIDER_SITE_OTHER): Payer: BC Managed Care – PPO | Admitting: Family Medicine

## 2021-04-12 DIAGNOSIS — E291 Testicular hypofunction: Secondary | ICD-10-CM

## 2021-04-13 ENCOUNTER — Other Ambulatory Visit: Payer: Self-pay | Admitting: Cardiovascular Disease

## 2021-04-15 NOTE — Progress Notes (Addendum)
Patient was here 04/13/2021 for testosterone shot per Dr. Carlota Raspberry.  ?

## 2021-04-23 ENCOUNTER — Telehealth: Payer: Self-pay | Admitting: Cardiovascular Disease

## 2021-04-23 DIAGNOSIS — Z5181 Encounter for therapeutic drug level monitoring: Secondary | ICD-10-CM

## 2021-04-23 DIAGNOSIS — I483 Typical atrial flutter: Secondary | ICD-10-CM

## 2021-04-23 MED ORDER — WARFARIN SODIUM 5 MG PO TABS: ORAL_TABLET | ORAL | 0 refills | Status: DC

## 2021-04-23 NOTE — Telephone Encounter (Signed)
?*  STAT* If patient is at the pharmacy, call can be transferred to refill team. ? ? ?1. Which medications need to be refilled? (please list name of each medication and dose if known)  ?warfarin (COUMADIN) 5 MG tablet ?2. Which pharmacy/location (including street and city if local pharmacy) is medication to be sent to? ?[place], [place] ? ?3. Do they need a 30 day or 90 day supply? 90 day ?

## 2021-04-23 NOTE — Telephone Encounter (Signed)
Please review refill , Thanks (:  ?

## 2021-04-29 ENCOUNTER — Ambulatory Visit: Payer: BC Managed Care – PPO

## 2021-04-30 ENCOUNTER — Ambulatory Visit (INDEPENDENT_AMBULATORY_CARE_PROVIDER_SITE_OTHER): Payer: BC Managed Care – PPO | Admitting: Registered Nurse

## 2021-04-30 DIAGNOSIS — E291 Testicular hypofunction: Secondary | ICD-10-CM

## 2021-04-30 NOTE — Progress Notes (Signed)
Pt here today for testosterone injection was administered to Lt last visit with no issue will administer to right today  ?

## 2021-05-09 ENCOUNTER — Encounter: Payer: Self-pay | Admitting: Podiatry

## 2021-05-09 ENCOUNTER — Ambulatory Visit (INDEPENDENT_AMBULATORY_CARE_PROVIDER_SITE_OTHER): Payer: BC Managed Care – PPO | Admitting: Podiatry

## 2021-05-09 ENCOUNTER — Ambulatory Visit: Payer: BC Managed Care – PPO

## 2021-05-09 DIAGNOSIS — M2040 Other hammer toe(s) (acquired), unspecified foot: Secondary | ICD-10-CM

## 2021-05-09 DIAGNOSIS — D2371 Other benign neoplasm of skin of right lower limb, including hip: Secondary | ICD-10-CM | POA: Diagnosis not present

## 2021-05-09 DIAGNOSIS — D2372 Other benign neoplasm of skin of left lower limb, including hip: Secondary | ICD-10-CM

## 2021-05-09 NOTE — Progress Notes (Signed)
?Subjective:  ?Patient ID: Mark Gallagher, male    DOB: [birth year],  MRN: [id number] ?HPI ?Chief Complaint  ?Patient presents with  ? Toe Pain  ?  Toes bilateral - hammer toe deformities x years, 4th toes bilateral (L>R) has corns, shoes are uncomfortable and has trouble walking comfortably, he has acromegly and has issues with gait, Dr. Barkley Bruns evaluated and recommended fusions of the toe, but patient is concerned about not being able to maintain mobility, as that will not be good for his other conditions, looking for a 2nd opinion  ? New Patient (Initial Visit)  ? ? ?51 y.o. male presents with the above complaint.  ? ?ROS: Denies fever chills nausea vomiting muscle aches pains calf pain back pain chest pain shortness of breath. ? ?Past Medical History:  ?Diagnosis Date  ? Acromegaly ([place])   ? Allergy   ? ? per pt - increased sneezing   ? Ambulates with cane   ? Arthritis   ? Atypical nevus 07/13/2014  ? moderate atypia - left post knee - widershave (free)  ? Atypical nevus 08/17/2014  ? moderate atypia - right mid back   ? Atypical nevus 12/20/2015  ? moderate to severe atypia - upper mid back - widershave  ? Atypical nevus 09/18/2017  ? mild atypia - left anterior back  ? Atypical nevus 09/18/2017  ? moderate atypia - left posterior back  ? Atypical nevus 07/16/2018  ? marked atypical - right neck, lower  ? Blood transfusion without reported diagnosis   ? Cancer [facility])   ? atypical mole - no cancer per pt   ? History of pituitary tumor   ? Hypertension   ? pt denies - on metoprol for heart   ? Hypothyroidism   ? Knee pain, bilateral   ? Pneumonia   ? Testosterone deficiency   ? Thyroid disease   ? ?Past Surgical History:  ?Procedure Laterality Date  ? CARDIAC ELECTROPHYSIOLOGY STUDY AND ABLATION    ? [place] SURGERY  2007  ? Anterior C4/5 vertebrectomy and c3-C6 laminoplasty with plating  ? CRANIOTOMY FOR TUMOR    ? pituitary adenoma  ? SPINAL FUSION    ? 2007, 2016 c3- t 2   ? THORACIC AORTIC ANEURYSM  REPAIR    ? with Aortic valve replacement   ? TOTAL HIP ARTHROPLASTY Right 03/16/2018  ? Procedure: TOTAL HIP ARTHROPLASTY Anterior;  Surgeon: Renette Butters, MD;  Location: [facility] ORS;  Service: Orthopedics;  Laterality: Right;  ? ? ?Current Outpatient Medications:  ?  aluminum chloride (DRYSOL) 20 % external solution, Apply topically at bedtime., Disp: 35 mL, Rfl: 6 ?  atorvastatin (LIPITOR) 40 MG tablet, Take 40 mg by mouth daily., Disp: , Rfl:  ?  benzonatate (TESSALON) 100 MG capsule, Take 1 capsule (100 mg total) by mouth 3 (three) times daily as needed for cough., Disp: 20 capsule, Rfl: 0 ?  Cholecalciferol (VITAMIN D3) 25 MCG (1000 UT) CAPS, Take 2,000 Units by mouth daily. , Disp: , Rfl:  ?  eplerenone (INSPRA) 25 MG tablet, Take 25 mg by mouth daily., Disp: , Rfl:  ?  ferrous sulfate 325 (65 FE) MG tablet, Take 1 tablet (325 mg total) by mouth 2 (two) times daily with a meal., Disp: 60 tablet, Rfl: 1 ?  hydrocortisone (CORTEF) 10 MG tablet, Take 1 tablet daily.  You may take 2 tablets daily with excessive weakness and tiredness and lethargy. (Patient taking differently: Take 10 mg by mouth daily.), Disp: 60  tablet, Rfl: 0 ?  levothyroxine (SYNTHROID, LEVOTHROID) 200 MCG tablet, Take 1 tablet (200 mcg total) by mouth daily., Disp: , Rfl:  ?  metoprolol succinate (TOPROL-XL) 25 MG 24 hr tablet, Take 25 mg by mouth daily., Disp: , Rfl:  ?  Multiple Vitamins-Minerals (CENTRUM SILVER PO), Take 1 tablet by mouth daily., Disp: , Rfl:  ?  polyethylene glycol (MIRALAX / GLYCOLAX) packet, Take 17 g by mouth daily as needed for mild constipation., Disp: , Rfl:  ?  promethazine (PHENERGAN) 25 MG tablet, Take 1 tablet (25 mg total) by mouth every 6 (six) hours as needed for nausea or vomiting., Disp: 30 tablet, Rfl: 0 ?  promethazine-dextromethorphan (PROMETHAZINE-DM) 6.25-15 MG/5ML syrup, Take 5 mLs by mouth 4 (four) times daily as needed. (Patient not taking: Reported on 01/07/2021), Disp: 180 mL, Rfl: 0 ?  SOMAVERT 20  MG SOLR, Inject into the skin., Disp: , Rfl:  ?  testosterone cypionate (DEPOTESTOSTERONE CYPIONATE) 200 MG/ML injection, SMARTSIG:0.5 Milliliter(s) IM Every 2 Weeks, Disp: , Rfl:  ?  warfarin (COUMADIN) 5 MG tablet, TAKE 3 TABLETS BY MOUTH ONCE DAILY AS DIRECTED BY THE COUMADIN CLINIC, Disp: 100 tablet, Rfl: 0 ? ?Current Facility-Administered Medications:  ?  octreotide (SANDOSTATIN LAR) IM injection 30 mg, 30 mg, Intramuscular, Q28 days, Wendie Agreste, MD, 30 mg at 03/13/17 1609 ?  testosterone cypionate (DEPOTESTOSTERONE CYPIONATE) injection 100 mg, 100 mg, Intramuscular, Q14 Days, Wendie Agreste, MD, 100 mg at 08/04/19 1049 ?  testosterone cypionate (DEPOTESTOSTERONE CYPIONATE) injection 100 mg, 100 mg, Intramuscular, Q14 Days, Wendie Agreste, MD, 100 mg at 06/22/20 1117 ?  testosterone cypionate (DEPOTESTOSTERONE CYPIONATE) injection 100 mg, 100 mg, Intramuscular, Q14 Days, Wendie Agreste, MD, 100 mg at 08/17/20 1544 ?  testosterone cypionate (DEPOTESTOSTERONE CYPIONATE) injection 100 mg, 100 mg, Intramuscular, Q14 Days, Midge Minium, MD, 100 mg at 10/02/20 1508 ?  testosterone cypionate (DEPOTESTOSTERONE CYPIONATE) injection 100 mg, 100 mg, Intramuscular, Q14 Days, Midge Minium, MD, 100 mg at 10/30/20 1057 ?  testosterone cypionate (DEPOTESTOSTERONE CYPIONATE) injection 100 mg, 100 mg, Intramuscular, Q14 Days, Maximiano Coss, NP, 100 mg at 02/27/21 1054 ?  testosterone cypionate (DEPOTESTOSTERONE CYPIONATE) injection 100 mg, 100 mg, Intramuscular, Q14 Days, Wendie Agreste, MD, 100 mg at 04/30/21 1043 ?  testosterone cypionate (DEPOTESTOSTERONE CYPIONATE) injection 200 mg, 200 mg, Intramuscular, Q14 Days, Jacelyn Pi, Irma M, MD, 200 mg at 05/09/20 1125 ?  testosterone cypionate (DEPOTESTOSTERONE CYPIONATE) injection 200 mg, 200 mg, Intramuscular, Q14 Days, Wendie Agreste, MD, 200 mg at 10/19/19 1116 ?  testosterone cypionate (DEPOTESTOSTERONE CYPIONATE) injection 200 mg, 200  mg, Intramuscular, Q14 Days, Wendie Agreste, MD, 200 mg at 07/20/20 1620 ?  testosterone cypionate (DEPOTESTOSTERONE CYPIONATE) injection 200 mg, 200 mg, Intramuscular, Q14 Days, Wendie Agreste, MD, 200 mg at 09/03/20 1649 ?  testosterone cypionate (DEPOTESTOSTERONE CYPIONATE) injection 200 mg, 200 mg, Intramuscular, Q14 Days, Wendie Agreste, MD, 200 mg at 09/17/20 1609 ? ?Allergies  ?Allergen Reactions  ? Oxycodone Nausea Only  ? Quinolones   ?  Other reaction(s): Other (See Comments) ?Fluroquinolone antibiotics should be avoided in patients with history of aortic aneurysm/dissection ([email or web address])  ? ?Review of Systems ?Objective:  ?There were no vitals filed for this visit. ? ?General: Well developed, nourished, in no acute distress, alert and oriented x3  ? ?Dermatological: Skin is warm, dry and supple bilateral. Nails x 10 are well maintained; remaining integument appears unremarkable at this time. There are no open sores, no preulcerative  lesions, no rash or signs of infection present.  Reactive hyperkeratotic lesion overlying the PIPJ fourth digit left foot.  No signs of infection.  No open lesions or wounds. ? ?Vascular: Dorsalis Pedis artery and Posterior Tibial artery pedal pulses are 2/4 bilateral with immedate capillary fill time. Pedal hair growth present. No varicosities and no lower extremity edema present bilateral.  ? ?Neruologic: Grossly intact via light touch bilateral. Vibratory intact via tuning fork bilateral. Protective threshold with Semmes Wienstein monofilament intact to all pedal sites bilateral. Patellar and Achilles deep tendon reflexes 2+ bilateral. No Babinski or clonus noted bilateral.  ? ?Musculoskeletal: No gross boney pedal deformities bilateral. No pain, crepitus, or limitation noted with foot and ankle range of motion bilateral. Muscular strength 5/5 in all groups tested bilateral.  Cavus foot deformity with flexible hammertoe  deformities 1 through 5 bilateral ? ?Gait: Unassisted, Nonantalgic.  ? ? ?Radiographs: ? ?None taken ? ?Assessment & Plan:  ? 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  Chronic wound to t

## 2021-05-14 ENCOUNTER — Ambulatory Visit (INDEPENDENT_AMBULATORY_CARE_PROVIDER_SITE_OTHER): Payer: BC Managed Care – PPO | Admitting: Registered Nurse

## 2021-05-14 DIAGNOSIS — E291 Testicular hypofunction: Secondary | ICD-10-CM | POA: Diagnosis not present

## 2021-05-14 NOTE — Progress Notes (Signed)
Patient given Testerone injection in left upper outer quadrant without issue .  ?

## 2021-05-23 ENCOUNTER — Ambulatory Visit (INDEPENDENT_AMBULATORY_CARE_PROVIDER_SITE_OTHER): Payer: BC Managed Care – PPO

## 2021-05-23 DIAGNOSIS — Z5181 Encounter for therapeutic drug level monitoring: Secondary | ICD-10-CM | POA: Diagnosis not present

## 2021-05-23 LAB — POCT INR: INR: 1.8 — AB (ref 2.0–3.0)

## 2021-05-23 NOTE — Patient Instructions (Signed)
continue warfarin 3 tablets daily except for 2 tablets on Tuesday and Saturdays. Recheck INR 6 weeks. [facility] [phone].  ?

## 2021-05-28 ENCOUNTER — Ambulatory Visit (INDEPENDENT_AMBULATORY_CARE_PROVIDER_SITE_OTHER): Payer: BC Managed Care – PPO | Admitting: Family Medicine

## 2021-05-28 DIAGNOSIS — E291 Testicular hypofunction: Secondary | ICD-10-CM

## 2021-05-28 NOTE — Progress Notes (Addendum)
Pt was administered his injection without complications  ?

## 2021-06-03 ENCOUNTER — Other Ambulatory Visit: Payer: Self-pay

## 2021-06-03 ENCOUNTER — Telehealth: Payer: Self-pay | Admitting: Cardiovascular Disease

## 2021-06-03 DIAGNOSIS — Z5181 Encounter for therapeutic drug level monitoring: Secondary | ICD-10-CM

## 2021-06-03 DIAGNOSIS — I483 Typical atrial flutter: Secondary | ICD-10-CM

## 2021-06-03 MED ORDER — WARFARIN SODIUM 5 MG PO TABS: ORAL_TABLET | ORAL | 3 refills | Status: DC

## 2021-06-03 NOTE — Telephone Encounter (Signed)
Patient is requesting Warfarin 90 day supply sent to [facility] on [address] ?

## 2021-06-03 NOTE — Telephone Encounter (Signed)
?*  STAT* If patient is at the pharmacy, call can be transferred to refill team. ? ? ?1. Which medications need to be refilled? (please list name of each medication and dose if known) New  prescription for Warfarin ? ?2. Which pharmacy/location (including street and city if local pharmacy) is medication to be sent to? [place], [place] ? ?3. Do they need a 30 day or 90 day supply? 90 days please and refills- he said the last 2 times, he did not get 90 days ? ?

## 2021-06-11 ENCOUNTER — Encounter: Payer: Self-pay | Admitting: Podiatry

## 2021-06-11 ENCOUNTER — Ambulatory Visit (INDEPENDENT_AMBULATORY_CARE_PROVIDER_SITE_OTHER): Payer: BC Managed Care – PPO | Admitting: Podiatry

## 2021-06-11 ENCOUNTER — Ambulatory Visit (INDEPENDENT_AMBULATORY_CARE_PROVIDER_SITE_OTHER): Payer: BC Managed Care – PPO | Admitting: Registered Nurse

## 2021-06-11 DIAGNOSIS — M205X2 Other deformities of toe(s) (acquired), left foot: Secondary | ICD-10-CM

## 2021-06-11 DIAGNOSIS — E291 Testicular hypofunction: Secondary | ICD-10-CM

## 2021-06-11 DIAGNOSIS — M205X9 Other deformities of toe(s) (acquired), unspecified foot: Secondary | ICD-10-CM

## 2021-06-11 NOTE — Progress Notes (Signed)
He presents today for a extensor tenotomy fourth toe left foot to help prevent a chronic painful lesion to the dorsal aspect of the PIPJ.  Past medical history is remarkable for acromegaly and aneurysm abdominal type.  Objective: Vital signs are stable alert and oriented x3.  Pulses are palpable.  At this point I isolated the fourth extensor tendon to his hammertoe.  I then localized it with lidocaine with epinephrine since he utilizes Coumadin.  I then prepped it and using an 18-gauge needle I was then able to feather the extensor tendon to allow a release which did allow the toe to drop down greater than eighth of an inch which is more than likely I will need to help prevent this lesion from recurring.  Assessment: Extensor tenotomy fourth slip left foot.  Plan: This was performed today after local anesthetic was administered utilizing 18-gauge needle and transected the long extensor tendon to the fourth digit dropping the toe down to prevent the lesion from ulcerating.  It was then sealed with Dermabond and dressed a compressive dressing was applied.  Follow-up with him in 1 week he will continue the use of his Darco shoe until that time.

## 2021-06-11 NOTE — Patient Instructions (Signed)
Leave bandage in place and dry. We will follow up with you in 1 week for recheck.

## 2021-06-11 NOTE — Progress Notes (Signed)
Pt here today for biweekly testosterone injection, no concerns pt is well

## 2021-06-11 NOTE — Addendum Note (Signed)
Addended by: Patrcia Dolly on: 06/11/2021 10:45 AM   Modules accepted: Level of Service

## 2021-06-20 ENCOUNTER — Encounter: Payer: Self-pay | Admitting: Podiatry

## 2021-06-20 ENCOUNTER — Ambulatory Visit (INDEPENDENT_AMBULATORY_CARE_PROVIDER_SITE_OTHER): Payer: BC Managed Care – PPO | Admitting: Podiatry

## 2021-06-20 DIAGNOSIS — Z9889 Other specified postprocedural states: Secondary | ICD-10-CM

## 2021-06-20 DIAGNOSIS — M205X9 Other deformities of toe(s) (acquired), unspecified foot: Secondary | ICD-10-CM

## 2021-06-22 NOTE — Progress Notes (Signed)
He presents today for follow-up of his tenotomy to his fourth toe left foot.  He states that he really does not hurt at all and extensor tenotomy was performed last week.  Denies fever chills nausea vomit muscle aches pains calf pain back pain chest pain shortness of breath.  Objective: Dressing intact was removed demonstrates no erythema edema salines drainage odor his fourth toe left foot does demonstrate a plantarflexion at the level of the metatarsal phalangeal joint which should help reduce the irritation of the dorsal PIPJ fourth toe left.  Assessment: Well-healing surgical foot.  Plan: Follow-up with me in about 1 month to make sure everything is doing better.

## 2021-06-25 DIAGNOSIS — E220 Acromegaly and pituitary gigantism: Secondary | ICD-10-CM | POA: Diagnosis not present

## 2021-06-25 DIAGNOSIS — E2740 Unspecified adrenocortical insufficiency: Secondary | ICD-10-CM | POA: Diagnosis not present

## 2021-06-25 DIAGNOSIS — E890 Postprocedural hypothyroidism: Secondary | ICD-10-CM | POA: Diagnosis not present

## 2021-06-25 DIAGNOSIS — E349 Endocrine disorder, unspecified: Secondary | ICD-10-CM | POA: Diagnosis not present

## 2021-06-26 ENCOUNTER — Ambulatory Visit (INDEPENDENT_AMBULATORY_CARE_PROVIDER_SITE_OTHER): Payer: BC Managed Care – PPO | Admitting: Family Medicine

## 2021-06-26 DIAGNOSIS — E291 Testicular hypofunction: Secondary | ICD-10-CM

## 2021-06-26 NOTE — Progress Notes (Signed)
Pt was given testosterone injection without issue, pt expressed no concerns

## 2021-07-04 ENCOUNTER — Ambulatory Visit (INDEPENDENT_AMBULATORY_CARE_PROVIDER_SITE_OTHER): Payer: BC Managed Care – PPO

## 2021-07-04 DIAGNOSIS — Z5181 Encounter for therapeutic drug level monitoring: Secondary | ICD-10-CM

## 2021-07-04 DIAGNOSIS — I483 Typical atrial flutter: Secondary | ICD-10-CM

## 2021-07-04 LAB — POCT INR: INR: 1.5 — AB (ref 2.0–3.0)

## 2021-07-04 NOTE — Patient Instructions (Signed)
Description   Continue on same dosage of Warfarin 3 tablets daily except for 2 tablets on Tuesdays and Saturdays. Recheck INR 6 weeks. [facility] [phone].       

## 2021-07-10 ENCOUNTER — Ambulatory Visit: Payer: BC Managed Care – PPO

## 2021-07-12 ENCOUNTER — Ambulatory Visit (INDEPENDENT_AMBULATORY_CARE_PROVIDER_SITE_OTHER): Payer: BC Managed Care – PPO | Admitting: Family Medicine

## 2021-07-12 DIAGNOSIS — E291 Testicular hypofunction: Secondary | ICD-10-CM | POA: Diagnosis not present

## 2021-07-12 DIAGNOSIS — E349 Endocrine disorder, unspecified: Secondary | ICD-10-CM

## 2021-07-25 ENCOUNTER — Ambulatory Visit (INDEPENDENT_AMBULATORY_CARE_PROVIDER_SITE_OTHER): Payer: BC Managed Care – PPO | Admitting: Family Medicine

## 2021-07-25 DIAGNOSIS — E291 Testicular hypofunction: Secondary | ICD-10-CM | POA: Diagnosis not present

## 2021-07-25 NOTE — Progress Notes (Signed)
Pt was administered the testosterone injection, no concerns

## 2021-08-06 ENCOUNTER — Ambulatory Visit (INDEPENDENT_AMBULATORY_CARE_PROVIDER_SITE_OTHER): Payer: BC Managed Care – PPO | Admitting: Podiatry

## 2021-08-06 ENCOUNTER — Encounter: Payer: Self-pay | Admitting: Podiatry

## 2021-08-06 DIAGNOSIS — M7752 Other enthesopathy of left foot: Secondary | ICD-10-CM

## 2021-08-06 DIAGNOSIS — D2372 Other benign neoplasm of skin of left lower limb, including hip: Secondary | ICD-10-CM | POA: Diagnosis not present

## 2021-08-06 MED ORDER — DEXAMETHASONE SODIUM PHOSPHATE 120 MG/30ML IJ SOLN
2.0000 mg | Freq: Once | INTRAMUSCULAR | Status: AC
  Administered 2021-08-06: 2 mg via INTRA_ARTICULAR

## 2021-08-06 NOTE — Progress Notes (Signed)
He presents today for follow-up of his tenotomy to his fourth toe he states that he still seems to be rubbing just about as much as it was before the procedure.  Objective: Vital signs are stable he is alert and oriented x3.  Fourth toe does appear to be sitting lower than it was previously but the scab has not sloughed off from the dorsal aspect of the PIPJ.  It also appears that there is some fluctuance within the site lesional area of that joint.  Assessment still has hammertoe deformities but some bursitis and painful callus still present.  Plan: I did recommend that he get back to his regular routine I debrided the callus for him today and also injected a small amount of dexamethasone for the bursitis.

## 2021-08-08 ENCOUNTER — Ambulatory Visit: Payer: BC Managed Care – PPO

## 2021-08-09 ENCOUNTER — Ambulatory Visit (INDEPENDENT_AMBULATORY_CARE_PROVIDER_SITE_OTHER): Payer: BC Managed Care – PPO | Admitting: Family Medicine

## 2021-08-09 DIAGNOSIS — E291 Testicular hypofunction: Secondary | ICD-10-CM

## 2021-08-09 NOTE — Progress Notes (Signed)
Pt here for testosterone injection given without issue no concerns

## 2021-08-15 ENCOUNTER — Ambulatory Visit (INDEPENDENT_AMBULATORY_CARE_PROVIDER_SITE_OTHER): Payer: BC Managed Care – PPO

## 2021-08-15 DIAGNOSIS — Z5181 Encounter for therapeutic drug level monitoring: Secondary | ICD-10-CM | POA: Diagnosis not present

## 2021-08-15 LAB — POCT INR: INR: 1.6 — AB (ref 2.0–3.0)

## 2021-08-15 NOTE — Patient Instructions (Signed)
Continue on same dosage of Warfarin 3 tablets daily except for 2 tablets on Tuesdays and Saturdays. Recheck INR 6 weeks. [facility] [phone].

## 2021-08-23 ENCOUNTER — Ambulatory Visit (INDEPENDENT_AMBULATORY_CARE_PROVIDER_SITE_OTHER): Payer: BC Managed Care – PPO | Admitting: Family Medicine

## 2021-08-23 DIAGNOSIS — E291 Testicular hypofunction: Secondary | ICD-10-CM

## 2021-08-23 MED ORDER — TESTOSTERONE CYPIONATE 200 MG/ML IM SOLN
200.0000 mg | INTRAMUSCULAR | Status: DC
  Administered 2021-08-23 – 2021-10-08 (×4): 200 mg via INTRAMUSCULAR

## 2021-08-23 NOTE — Progress Notes (Signed)
Pt came in for his Testerone injection . Advised to return to clinic in 14 days for another injection . Gave 22 mL in right upper outer hip

## 2021-08-27 ENCOUNTER — Ambulatory Visit: Payer: BC Managed Care – PPO | Admitting: Physician Assistant

## 2021-08-27 ENCOUNTER — Ambulatory Visit: Payer: BC Managed Care – PPO | Admitting: Podiatry

## 2021-08-29 ENCOUNTER — Ambulatory Visit (INDEPENDENT_AMBULATORY_CARE_PROVIDER_SITE_OTHER): Payer: BC Managed Care – PPO | Admitting: Podiatry

## 2021-08-29 ENCOUNTER — Encounter: Payer: Self-pay | Admitting: Podiatry

## 2021-08-29 DIAGNOSIS — D2371 Other benign neoplasm of skin of right lower limb, including hip: Secondary | ICD-10-CM | POA: Diagnosis not present

## 2021-08-29 DIAGNOSIS — D2372 Other benign neoplasm of skin of left lower limb, including hip: Secondary | ICD-10-CM

## 2021-08-29 NOTE — Progress Notes (Signed)
He presents today for follow-up of his tenotomy's he states that his toes are doing much better the calluses are feeling better and wearing regular shoes seems to be doing okay at this point.  Objective: Vital signs stable oriented x 3.  Pulses palpable.  His toes do appear to be improving particularly left foot where the tenotomy's were performed.  Debrided the reactive hyperkeratotic lesion.  Assessment: Benign skin lesions toes.  Plan: Debrided benign skin lesions.

## 2021-09-06 ENCOUNTER — Ambulatory Visit: Payer: BC Managed Care – PPO

## 2021-09-09 ENCOUNTER — Ambulatory Visit (INDEPENDENT_AMBULATORY_CARE_PROVIDER_SITE_OTHER): Payer: BC Managed Care – PPO | Admitting: Family Medicine

## 2021-09-09 DIAGNOSIS — E349 Endocrine disorder, unspecified: Secondary | ICD-10-CM

## 2021-09-09 DIAGNOSIS — E291 Testicular hypofunction: Secondary | ICD-10-CM

## 2021-09-09 NOTE — Progress Notes (Addendum)
Patient was in office for his Testosterone inject in [place] . Patient tolerated the medication well and will return in 2 weeks.

## 2021-09-12 DIAGNOSIS — M9902 Segmental and somatic dysfunction of thoracic region: Secondary | ICD-10-CM | POA: Diagnosis not present

## 2021-09-12 DIAGNOSIS — M4122 Other idiopathic scoliosis, cervical region: Secondary | ICD-10-CM | POA: Diagnosis not present

## 2021-09-12 DIAGNOSIS — M9901 Segmental and somatic dysfunction of cervical region: Secondary | ICD-10-CM | POA: Diagnosis not present

## 2021-09-12 DIAGNOSIS — M4124 Other idiopathic scoliosis, thoracic region: Secondary | ICD-10-CM | POA: Diagnosis not present

## 2021-09-16 DIAGNOSIS — M4122 Other idiopathic scoliosis, cervical region: Secondary | ICD-10-CM | POA: Diagnosis not present

## 2021-09-16 DIAGNOSIS — M9902 Segmental and somatic dysfunction of thoracic region: Secondary | ICD-10-CM | POA: Diagnosis not present

## 2021-09-16 DIAGNOSIS — M4124 Other idiopathic scoliosis, thoracic region: Secondary | ICD-10-CM | POA: Diagnosis not present

## 2021-09-16 DIAGNOSIS — M9901 Segmental and somatic dysfunction of cervical region: Secondary | ICD-10-CM | POA: Diagnosis not present

## 2021-09-18 DIAGNOSIS — M9902 Segmental and somatic dysfunction of thoracic region: Secondary | ICD-10-CM | POA: Diagnosis not present

## 2021-09-18 DIAGNOSIS — M9901 Segmental and somatic dysfunction of cervical region: Secondary | ICD-10-CM | POA: Diagnosis not present

## 2021-09-18 DIAGNOSIS — M4122 Other idiopathic scoliosis, cervical region: Secondary | ICD-10-CM | POA: Diagnosis not present

## 2021-09-18 DIAGNOSIS — M4124 Other idiopathic scoliosis, thoracic region: Secondary | ICD-10-CM | POA: Diagnosis not present

## 2021-09-20 NOTE — Progress Notes (Signed)
Note reviewed, and agree with documentation and plan Signed,   [name], MD [place] 09/20/21 5:45 PM .

## 2021-09-24 ENCOUNTER — Ambulatory Visit (INDEPENDENT_AMBULATORY_CARE_PROVIDER_SITE_OTHER): Payer: BC Managed Care – PPO | Admitting: Family Medicine

## 2021-09-24 DIAGNOSIS — M9901 Segmental and somatic dysfunction of cervical region: Secondary | ICD-10-CM | POA: Diagnosis not present

## 2021-09-24 DIAGNOSIS — E291 Testicular hypofunction: Secondary | ICD-10-CM | POA: Diagnosis not present

## 2021-09-24 DIAGNOSIS — M4124 Other idiopathic scoliosis, thoracic region: Secondary | ICD-10-CM | POA: Diagnosis not present

## 2021-09-24 DIAGNOSIS — M4122 Other idiopathic scoliosis, cervical region: Secondary | ICD-10-CM | POA: Diagnosis not present

## 2021-09-24 DIAGNOSIS — M9902 Segmental and somatic dysfunction of thoracic region: Secondary | ICD-10-CM | POA: Diagnosis not present

## 2021-09-24 NOTE — Progress Notes (Signed)
Pt presents for testosterone injection, no concerns

## 2021-09-25 DIAGNOSIS — Z981 Arthrodesis status: Secondary | ICD-10-CM | POA: Diagnosis not present

## 2021-09-25 DIAGNOSIS — M4712 Other spondylosis with myelopathy, cervical region: Secondary | ICD-10-CM | POA: Diagnosis not present

## 2021-09-25 DIAGNOSIS — E890 Postprocedural hypothyroidism: Secondary | ICD-10-CM | POA: Diagnosis not present

## 2021-09-25 DIAGNOSIS — M4802 Spinal stenosis, cervical region: Secondary | ICD-10-CM | POA: Diagnosis not present

## 2021-09-25 DIAGNOSIS — E349 Endocrine disorder, unspecified: Secondary | ICD-10-CM | POA: Diagnosis not present

## 2021-09-25 DIAGNOSIS — E2740 Unspecified adrenocortical insufficiency: Secondary | ICD-10-CM | POA: Diagnosis not present

## 2021-09-25 DIAGNOSIS — E220 Acromegaly and pituitary gigantism: Secondary | ICD-10-CM | POA: Diagnosis not present

## 2021-09-26 ENCOUNTER — Ambulatory Visit: Payer: BC Managed Care – PPO | Attending: Cardiovascular Disease

## 2021-09-26 DIAGNOSIS — Z5181 Encounter for therapeutic drug level monitoring: Secondary | ICD-10-CM

## 2021-09-26 DIAGNOSIS — M4124 Other idiopathic scoliosis, thoracic region: Secondary | ICD-10-CM | POA: Diagnosis not present

## 2021-09-26 DIAGNOSIS — M9902 Segmental and somatic dysfunction of thoracic region: Secondary | ICD-10-CM | POA: Diagnosis not present

## 2021-09-26 DIAGNOSIS — M9901 Segmental and somatic dysfunction of cervical region: Secondary | ICD-10-CM | POA: Diagnosis not present

## 2021-09-26 DIAGNOSIS — M4122 Other idiopathic scoliosis, cervical region: Secondary | ICD-10-CM | POA: Diagnosis not present

## 2021-09-26 LAB — POCT INR: INR: 1.5 — AB (ref 2.0–3.0)

## 2021-09-26 NOTE — Patient Instructions (Signed)
Description   Continue on same dosage of Warfarin 3 tablets daily except for 2 tablets on Tuesdays and Saturdays. Recheck INR 6 weeks. [facility] [phone].

## 2021-10-01 DIAGNOSIS — M9901 Segmental and somatic dysfunction of cervical region: Secondary | ICD-10-CM | POA: Diagnosis not present

## 2021-10-01 DIAGNOSIS — M9902 Segmental and somatic dysfunction of thoracic region: Secondary | ICD-10-CM | POA: Diagnosis not present

## 2021-10-01 DIAGNOSIS — M4124 Other idiopathic scoliosis, thoracic region: Secondary | ICD-10-CM | POA: Diagnosis not present

## 2021-10-01 DIAGNOSIS — M4122 Other idiopathic scoliosis, cervical region: Secondary | ICD-10-CM | POA: Diagnosis not present

## 2021-10-03 ENCOUNTER — Ambulatory Visit (INDEPENDENT_AMBULATORY_CARE_PROVIDER_SITE_OTHER): Payer: BC Managed Care – PPO | Admitting: Podiatrist

## 2021-10-03 DIAGNOSIS — D2372 Other benign neoplasm of skin of left lower limb, including hip: Secondary | ICD-10-CM

## 2021-10-03 DIAGNOSIS — D2371 Other benign neoplasm of skin of right lower limb, including hip: Secondary | ICD-10-CM

## 2021-10-03 DIAGNOSIS — M79676 Pain in unspecified toe(s): Secondary | ICD-10-CM | POA: Diagnosis not present

## 2021-10-03 DIAGNOSIS — B351 Tinea unguium: Secondary | ICD-10-CM

## 2021-10-03 DIAGNOSIS — M9902 Segmental and somatic dysfunction of thoracic region: Secondary | ICD-10-CM | POA: Diagnosis not present

## 2021-10-03 DIAGNOSIS — M4124 Other idiopathic scoliosis, thoracic region: Secondary | ICD-10-CM | POA: Diagnosis not present

## 2021-10-03 DIAGNOSIS — M4122 Other idiopathic scoliosis, cervical region: Secondary | ICD-10-CM | POA: Diagnosis not present

## 2021-10-03 DIAGNOSIS — M9901 Segmental and somatic dysfunction of cervical region: Secondary | ICD-10-CM | POA: Diagnosis not present

## 2021-10-08 ENCOUNTER — Ambulatory Visit (INDEPENDENT_AMBULATORY_CARE_PROVIDER_SITE_OTHER): Payer: BC Managed Care – PPO | Admitting: Family Medicine

## 2021-10-08 DIAGNOSIS — M9902 Segmental and somatic dysfunction of thoracic region: Secondary | ICD-10-CM | POA: Diagnosis not present

## 2021-10-08 DIAGNOSIS — M9901 Segmental and somatic dysfunction of cervical region: Secondary | ICD-10-CM | POA: Diagnosis not present

## 2021-10-08 DIAGNOSIS — E291 Testicular hypofunction: Secondary | ICD-10-CM

## 2021-10-08 DIAGNOSIS — M4124 Other idiopathic scoliosis, thoracic region: Secondary | ICD-10-CM | POA: Diagnosis not present

## 2021-10-08 DIAGNOSIS — M4122 Other idiopathic scoliosis, cervical region: Secondary | ICD-10-CM | POA: Diagnosis not present

## 2021-10-08 NOTE — Progress Notes (Signed)
Pt presented today for testosterone injection.

## 2021-10-10 ENCOUNTER — Encounter: Payer: Self-pay | Admitting: Podiatrist

## 2021-10-10 DIAGNOSIS — M4122 Other idiopathic scoliosis, cervical region: Secondary | ICD-10-CM | POA: Diagnosis not present

## 2021-10-10 DIAGNOSIS — M4124 Other idiopathic scoliosis, thoracic region: Secondary | ICD-10-CM | POA: Diagnosis not present

## 2021-10-10 DIAGNOSIS — M9901 Segmental and somatic dysfunction of cervical region: Secondary | ICD-10-CM | POA: Diagnosis not present

## 2021-10-10 DIAGNOSIS — M9902 Segmental and somatic dysfunction of thoracic region: Secondary | ICD-10-CM | POA: Diagnosis not present

## 2021-10-10 NOTE — Progress Notes (Signed)
Chief Complaint  Patient presents with   Follow-up    Callus f/u      HPI: Patient is 51 y.o. male who presents today for elongated toenails which are painful with shoes and ambulation as well as  hyperkeratotic skin lesions on the fourth toes of bilateral feet. He relates he has them trimmed routinely which helps with the pain.    Allergies  Allergen Reactions   Oxycodone Nausea Only   Quinolones     Other reaction(s): Other (See Comments) Fluroquinolone antibiotics should be avoided in patients with history of aortic aneurysm/dissection ([email or web address])    Review of systems is negative except as noted in the HPI.  Denies nausea/ vomiting/ fevers/ chills or night sweats.   Denies difficulty breathing, denies calf pain or tenderness  Physical Exam  Patient is awake, alert, and oriented x 3.  In no acute distress.    Vascular status is intact with palpable pedal pulses DP and PT bilateral and capillary refill time less than 3 seconds bilateral.  No edema or erythema noted.   Neurological exam reveals epicritic and protective sensation grossly intact bilateral.   Dermatological exam reveals skin is supple and dry to bilateral feet.  Hyperkeratotic lesions present dorsal fourth toes bilateral feet.  Digital nails are thick, discolored, dystrophic, brittle with subungual debris present and clinically mycotic x 10.     Musculoskeletal exam: Musculature intact with dorsiflexion, plantarflexion, inversion, eversion. Contracture fourth toes bilateral noted.     Assessment:   ICD-10-CM   1. Pain due to onychomycosis of toenail  B35.1    M79.676     2. Benign neoplasm of skin of left foot  D23.72     3. Benign neoplasm of skin of right foot  D23.71         Plan: Discussed exam findings with the patient.  Recommended a nail debridement.  This was carried out today with sterile nail nippers and a power burr without complication.  Periodic  routine nail debridement recommended every 3 months or as needed for follow-up.

## 2021-10-14 DIAGNOSIS — M4124 Other idiopathic scoliosis, thoracic region: Secondary | ICD-10-CM | POA: Diagnosis not present

## 2021-10-14 DIAGNOSIS — M9902 Segmental and somatic dysfunction of thoracic region: Secondary | ICD-10-CM | POA: Diagnosis not present

## 2021-10-14 DIAGNOSIS — M4122 Other idiopathic scoliosis, cervical region: Secondary | ICD-10-CM | POA: Diagnosis not present

## 2021-10-14 DIAGNOSIS — M9901 Segmental and somatic dysfunction of cervical region: Secondary | ICD-10-CM | POA: Diagnosis not present

## 2021-10-17 DIAGNOSIS — M9901 Segmental and somatic dysfunction of cervical region: Secondary | ICD-10-CM | POA: Diagnosis not present

## 2021-10-17 DIAGNOSIS — M4124 Other idiopathic scoliosis, thoracic region: Secondary | ICD-10-CM | POA: Diagnosis not present

## 2021-10-17 DIAGNOSIS — M9902 Segmental and somatic dysfunction of thoracic region: Secondary | ICD-10-CM | POA: Diagnosis not present

## 2021-10-17 DIAGNOSIS — M4122 Other idiopathic scoliosis, cervical region: Secondary | ICD-10-CM | POA: Diagnosis not present

## 2021-10-22 ENCOUNTER — Ambulatory Visit: Payer: BC Managed Care – PPO

## 2021-10-22 DIAGNOSIS — M4122 Other idiopathic scoliosis, cervical region: Secondary | ICD-10-CM | POA: Diagnosis not present

## 2021-10-22 DIAGNOSIS — M9901 Segmental and somatic dysfunction of cervical region: Secondary | ICD-10-CM | POA: Diagnosis not present

## 2021-10-22 DIAGNOSIS — M4124 Other idiopathic scoliosis, thoracic region: Secondary | ICD-10-CM | POA: Diagnosis not present

## 2021-10-22 DIAGNOSIS — M9902 Segmental and somatic dysfunction of thoracic region: Secondary | ICD-10-CM | POA: Diagnosis not present

## 2021-10-23 ENCOUNTER — Ambulatory Visit (INDEPENDENT_AMBULATORY_CARE_PROVIDER_SITE_OTHER): Payer: BC Managed Care – PPO | Admitting: Family Medicine

## 2021-10-23 DIAGNOSIS — E291 Testicular hypofunction: Secondary | ICD-10-CM

## 2021-10-23 MED ORDER — TESTOSTERONE CYPIONATE 200 MG/ML IM SOLN
200.0000 mg | INTRAMUSCULAR | Status: DC
  Administered 2021-10-23 – 2021-11-21 (×3): 200 mg via INTRAMUSCULAR

## 2021-10-23 NOTE — Patient Instructions (Signed)
Pt received his Testerone injection in right outer quadrant today . Tolerated well

## 2021-10-23 NOTE — Progress Notes (Signed)
Mark Gallagher is a 51 y.o. male presents to the office today for his Testerone injection . Gave injection in right upper outer quadrant. Pt tolerated well and advised to return to office in 14 days for next injection .   Eli Phillips

## 2021-10-28 ENCOUNTER — Other Ambulatory Visit: Payer: Self-pay

## 2021-10-28 ENCOUNTER — Telehealth: Payer: Self-pay | Admitting: Cardiovascular Disease

## 2021-10-28 DIAGNOSIS — Z5181 Encounter for therapeutic drug level monitoring: Secondary | ICD-10-CM

## 2021-10-28 DIAGNOSIS — I483 Typical atrial flutter: Secondary | ICD-10-CM

## 2021-10-28 MED ORDER — WARFARIN SODIUM 5 MG PO TABS: ORAL_TABLET | ORAL | 3 refills | Status: DC

## 2021-10-28 NOTE — Telephone Encounter (Signed)
I did not need this encounter. °

## 2021-10-28 NOTE — Telephone Encounter (Signed)
Spoke to patient he stated coumadin dose was changed.Stated [employer] will not approve saying too soon to refill.He only has enough to last 2 more days.I will send message to [facility] clinic.

## 2021-10-28 NOTE — Telephone Encounter (Signed)
New prescription sent to requested pharmacy. Made pt aware.

## 2021-10-28 NOTE — Telephone Encounter (Signed)
Prescription refill request received for warfarin Lov: 11/20/20 [name]: 11/07/21 Warfarin tablet strength: '5mg'$   Appropriate dose and refill sent to requested pharmacy.

## 2021-10-30 ENCOUNTER — Other Ambulatory Visit: Payer: Self-pay | Admitting: Cardiovascular Disease

## 2021-10-30 DIAGNOSIS — I483 Typical atrial flutter: Secondary | ICD-10-CM

## 2021-10-30 DIAGNOSIS — Z5181 Encounter for therapeutic drug level monitoring: Secondary | ICD-10-CM

## 2021-10-31 ENCOUNTER — Other Ambulatory Visit: Payer: Self-pay

## 2021-10-31 DIAGNOSIS — M4122 Other idiopathic scoliosis, cervical region: Secondary | ICD-10-CM | POA: Diagnosis not present

## 2021-10-31 DIAGNOSIS — M4124 Other idiopathic scoliosis, thoracic region: Secondary | ICD-10-CM | POA: Diagnosis not present

## 2021-10-31 DIAGNOSIS — Z5181 Encounter for therapeutic drug level monitoring: Secondary | ICD-10-CM

## 2021-10-31 DIAGNOSIS — M9902 Segmental and somatic dysfunction of thoracic region: Secondary | ICD-10-CM | POA: Diagnosis not present

## 2021-10-31 DIAGNOSIS — M9901 Segmental and somatic dysfunction of cervical region: Secondary | ICD-10-CM | POA: Diagnosis not present

## 2021-10-31 DIAGNOSIS — I483 Typical atrial flutter: Secondary | ICD-10-CM

## 2021-10-31 MED ORDER — WARFARIN SODIUM 5 MG PO TABS: ORAL_TABLET | ORAL | 1 refills | Status: DC

## 2021-10-31 NOTE — Telephone Encounter (Signed)
Called Pharmacy and sent new Warfarin refill. Also called pt and made him aware.

## 2021-10-31 NOTE — Telephone Encounter (Signed)
Caller stated they did not received the refill prescription and would like the refill re-faxed to them.

## 2021-10-31 NOTE — Telephone Encounter (Signed)
Pt called stating [facility] will not refill Warfarin because insurance stated it is too soon.  Called pharmacy and confirmed pt's current Warfarin dose.  Lov: 11/20/20 [name]: 11/07/21 Warfarin tablet strength: '5mg'$   Appropriate dose and new refill sent to requested pharmacy.

## 2021-11-04 DIAGNOSIS — L814 Other melanin hyperpigmentation: Secondary | ICD-10-CM | POA: Diagnosis not present

## 2021-11-04 DIAGNOSIS — L821 Other seborrheic keratosis: Secondary | ICD-10-CM | POA: Diagnosis not present

## 2021-11-04 DIAGNOSIS — L578 Other skin changes due to chronic exposure to nonionizing radiation: Secondary | ICD-10-CM | POA: Diagnosis not present

## 2021-11-04 DIAGNOSIS — D229 Melanocytic nevi, unspecified: Secondary | ICD-10-CM | POA: Diagnosis not present

## 2021-11-06 ENCOUNTER — Ambulatory Visit (INDEPENDENT_AMBULATORY_CARE_PROVIDER_SITE_OTHER): Payer: BC Managed Care – PPO | Admitting: Family Medicine

## 2021-11-06 DIAGNOSIS — E291 Testicular hypofunction: Secondary | ICD-10-CM

## 2021-11-06 DIAGNOSIS — Z23 Encounter for immunization: Secondary | ICD-10-CM

## 2021-11-06 NOTE — Progress Notes (Signed)
Patient presented today for biweekly testosterone injection, no concerns, scheduled for 2 weeks from today   Patient did also request flu vaccine while in the clinic today, ordered and administered, no concerns expressed regarding this injection either.

## 2021-11-07 ENCOUNTER — Ambulatory Visit: Payer: BC Managed Care – PPO | Attending: Cardiovascular Disease

## 2021-11-07 DIAGNOSIS — Z5181 Encounter for therapeutic drug level monitoring: Secondary | ICD-10-CM

## 2021-11-07 DIAGNOSIS — M4124 Other idiopathic scoliosis, thoracic region: Secondary | ICD-10-CM | POA: Diagnosis not present

## 2021-11-07 DIAGNOSIS — M4122 Other idiopathic scoliosis, cervical region: Secondary | ICD-10-CM | POA: Diagnosis not present

## 2021-11-07 DIAGNOSIS — M9901 Segmental and somatic dysfunction of cervical region: Secondary | ICD-10-CM | POA: Diagnosis not present

## 2021-11-07 DIAGNOSIS — M9902 Segmental and somatic dysfunction of thoracic region: Secondary | ICD-10-CM | POA: Diagnosis not present

## 2021-11-07 LAB — POCT INR: INR: 1.5 — AB (ref 2.0–3.0)

## 2021-11-07 NOTE — Patient Instructions (Signed)
Continue on same dosage of Warfarin 3 tablets daily except for 2 tablets on Tuesdays and Saturdays. Recheck INR 6 weeks. [facility]in Clinic [phone].

## 2021-11-14 DIAGNOSIS — M4122 Other idiopathic scoliosis, cervical region: Secondary | ICD-10-CM | POA: Diagnosis not present

## 2021-11-14 DIAGNOSIS — M4124 Other idiopathic scoliosis, thoracic region: Secondary | ICD-10-CM | POA: Diagnosis not present

## 2021-11-14 DIAGNOSIS — M9902 Segmental and somatic dysfunction of thoracic region: Secondary | ICD-10-CM | POA: Diagnosis not present

## 2021-11-14 DIAGNOSIS — M9901 Segmental and somatic dysfunction of cervical region: Secondary | ICD-10-CM | POA: Diagnosis not present

## 2021-11-19 DIAGNOSIS — M9901 Segmental and somatic dysfunction of cervical region: Secondary | ICD-10-CM | POA: Diagnosis not present

## 2021-11-19 DIAGNOSIS — M4124 Other idiopathic scoliosis, thoracic region: Secondary | ICD-10-CM | POA: Diagnosis not present

## 2021-11-19 DIAGNOSIS — M9902 Segmental and somatic dysfunction of thoracic region: Secondary | ICD-10-CM | POA: Diagnosis not present

## 2021-11-19 DIAGNOSIS — M4122 Other idiopathic scoliosis, cervical region: Secondary | ICD-10-CM | POA: Diagnosis not present

## 2021-11-20 ENCOUNTER — Ambulatory Visit: Payer: BC Managed Care – PPO

## 2021-11-21 ENCOUNTER — Ambulatory Visit (INDEPENDENT_AMBULATORY_CARE_PROVIDER_SITE_OTHER): Payer: BC Managed Care – PPO | Admitting: Family Medicine

## 2021-11-21 DIAGNOSIS — E291 Testicular hypofunction: Secondary | ICD-10-CM

## 2021-11-21 NOTE — Progress Notes (Signed)
Pt is here for Testerone injection, was administered without concern

## 2021-11-25 DIAGNOSIS — Z79890 Hormone replacement therapy (postmenopausal): Secondary | ICD-10-CM | POA: Diagnosis not present

## 2021-11-25 DIAGNOSIS — Z79899 Other long term (current) drug therapy: Secondary | ICD-10-CM | POA: Diagnosis not present

## 2021-11-25 DIAGNOSIS — E890 Postprocedural hypothyroidism: Secondary | ICD-10-CM | POA: Diagnosis not present

## 2021-11-25 DIAGNOSIS — E220 Acromegaly and pituitary gigantism: Secondary | ICD-10-CM | POA: Diagnosis not present

## 2021-11-25 DIAGNOSIS — Z7982 Long term (current) use of aspirin: Secondary | ICD-10-CM | POA: Diagnosis not present

## 2021-11-25 DIAGNOSIS — M8580 Other specified disorders of bone density and structure, unspecified site: Secondary | ICD-10-CM | POA: Diagnosis not present

## 2021-11-25 DIAGNOSIS — Z7901 Long term (current) use of anticoagulants: Secondary | ICD-10-CM | POA: Diagnosis not present

## 2021-11-28 DIAGNOSIS — M4124 Other idiopathic scoliosis, thoracic region: Secondary | ICD-10-CM | POA: Diagnosis not present

## 2021-11-28 DIAGNOSIS — M4122 Other idiopathic scoliosis, cervical region: Secondary | ICD-10-CM | POA: Diagnosis not present

## 2021-11-28 DIAGNOSIS — M9902 Segmental and somatic dysfunction of thoracic region: Secondary | ICD-10-CM | POA: Diagnosis not present

## 2021-11-28 DIAGNOSIS — M9901 Segmental and somatic dysfunction of cervical region: Secondary | ICD-10-CM | POA: Diagnosis not present

## 2021-12-05 ENCOUNTER — Ambulatory Visit (INDEPENDENT_AMBULATORY_CARE_PROVIDER_SITE_OTHER): Payer: BC Managed Care – PPO | Admitting: Family Medicine

## 2021-12-05 DIAGNOSIS — E291 Testicular hypofunction: Secondary | ICD-10-CM

## 2021-12-05 MED ORDER — TESTOSTERONE CYPIONATE 200 MG/ML IM SOLN
200.0000 mg | INTRAMUSCULAR | Status: DC
  Administered 2021-12-05: 200 mg via INTRAMUSCULAR

## 2021-12-05 NOTE — Progress Notes (Signed)
Pt came in today for his Testerone injection . Tolerated well advised to return to office in 14 days for next injection . Gave in Right upper out ter quadrant

## 2021-12-11 DIAGNOSIS — M4124 Other idiopathic scoliosis, thoracic region: Secondary | ICD-10-CM | POA: Diagnosis not present

## 2021-12-11 DIAGNOSIS — M4122 Other idiopathic scoliosis, cervical region: Secondary | ICD-10-CM | POA: Diagnosis not present

## 2021-12-11 DIAGNOSIS — M9902 Segmental and somatic dysfunction of thoracic region: Secondary | ICD-10-CM | POA: Diagnosis not present

## 2021-12-11 DIAGNOSIS — M9901 Segmental and somatic dysfunction of cervical region: Secondary | ICD-10-CM | POA: Diagnosis not present

## 2021-12-17 ENCOUNTER — Ambulatory Visit: Payer: BC Managed Care – PPO | Admitting: Podiatry

## 2021-12-18 ENCOUNTER — Ambulatory Visit (INDEPENDENT_AMBULATORY_CARE_PROVIDER_SITE_OTHER): Payer: BC Managed Care – PPO | Admitting: Family Medicine

## 2021-12-18 DIAGNOSIS — E291 Testicular hypofunction: Secondary | ICD-10-CM | POA: Diagnosis not present

## 2021-12-18 MED ORDER — TESTOSTERONE CYPIONATE 200 MG/ML IM SOLN
200.0000 mg | INTRAMUSCULAR | Status: DC
  Administered 2021-12-18 – 2022-01-15 (×3): 200 mg via INTRAMUSCULAR

## 2021-12-18 NOTE — Progress Notes (Signed)
Pt came in for his Testocerone injection . Gave in left upper outer quadrant . Pt tolerated well and advised to return to clinic in 14 days for next injection

## 2021-12-19 ENCOUNTER — Ambulatory Visit: Payer: BC Managed Care – PPO | Attending: Cardiology | Admitting: *Deleted

## 2021-12-19 DIAGNOSIS — Z5181 Encounter for therapeutic drug level monitoring: Secondary | ICD-10-CM | POA: Diagnosis not present

## 2021-12-19 LAB — POCT INR: INR: 1.9 — AB (ref 2.0–3.0)

## 2021-12-19 NOTE — Patient Instructions (Signed)
Description   Continue taking Warfarin 3 tablets daily except for 2 tablets on Tuesdays and Saturdays. Recheck INR 6 weeks. [facility]madin Clinic [phone].       

## 2022-01-01 ENCOUNTER — Ambulatory Visit (INDEPENDENT_AMBULATORY_CARE_PROVIDER_SITE_OTHER): Payer: Medicare Other | Admitting: Family Medicine

## 2022-01-01 DIAGNOSIS — E291 Testicular hypofunction: Secondary | ICD-10-CM

## 2022-01-01 NOTE — Progress Notes (Signed)
Patient presented today for testosterone injection was administered without concerns

## 2022-01-14 ENCOUNTER — Telehealth: Payer: Self-pay | Admitting: *Deleted

## 2022-01-14 NOTE — Patient Outreach (Signed)
  Care Coordination   01/14/2022 Name: Mark Gallagher MRN: [id number] DOB: 02-17-1970   Care Coordination Outreach Attempts:  An unsuccessful telephone outreach was attempted today to offer the patient information about available care coordination services as a benefit of their health plan.   Follow Up Plan:  Additional outreach attempts will be made to offer the patient care coordination information and services.   Encounter Outcome:  No Answer   Care Coordination Interventions:  No, not indicated    Raina Mina, RN Care Management Coordinator [place] Office [phone]

## 2022-01-15 ENCOUNTER — Ambulatory Visit (INDEPENDENT_AMBULATORY_CARE_PROVIDER_SITE_OTHER): Payer: Medicare Other

## 2022-01-15 DIAGNOSIS — E291 Testicular hypofunction: Secondary | ICD-10-CM

## 2022-01-15 NOTE — Progress Notes (Signed)
Pt presented today for testosterone injection, no concerns

## 2022-01-16 ENCOUNTER — Ambulatory Visit: Payer: Medicare Other | Admitting: Podiatry

## 2022-01-16 ENCOUNTER — Encounter: Payer: Self-pay | Admitting: Podiatry

## 2022-01-16 DIAGNOSIS — D2371 Other benign neoplasm of skin of right lower limb, including hip: Secondary | ICD-10-CM | POA: Diagnosis not present

## 2022-01-16 DIAGNOSIS — M79676 Pain in unspecified toe(s): Secondary | ICD-10-CM

## 2022-01-16 DIAGNOSIS — B351 Tinea unguium: Secondary | ICD-10-CM

## 2022-01-16 DIAGNOSIS — D2372 Other benign neoplasm of skin of left lower limb, including hip: Secondary | ICD-10-CM | POA: Diagnosis not present

## 2022-01-16 DIAGNOSIS — Z86018 Personal history of other benign neoplasm: Secondary | ICD-10-CM | POA: Insufficient documentation

## 2022-01-16 NOTE — Progress Notes (Signed)
He presents today chief complaint of painful elongated toenails and calluses bilateral.  Objective: Vital signs stable alert oriented x 3.  Pulses palpable clear is no erythema Emselex regular toenails are thick yellow dystrophic with mycotic immature deformities resulting in benign skin lesions dorsally and plantarly.  Assessment: Pain in limb secondary to hammertoe deformities and benign skin lesions as well as nail dystrophy.  Plan: Debridement of nails 1 through 5 bilateral debridement calluses bilateral.  Follow-up with him as needed

## 2022-01-21 ENCOUNTER — Ambulatory Visit (INDEPENDENT_AMBULATORY_CARE_PROVIDER_SITE_OTHER): Payer: BLUE CROSS/BLUE SHIELD | Admitting: Family

## 2022-01-21 ENCOUNTER — Telehealth: Payer: Self-pay | Admitting: Cardiovascular Disease

## 2022-01-21 ENCOUNTER — Encounter: Payer: Self-pay | Admitting: Family

## 2022-01-21 ENCOUNTER — Telehealth: Payer: Self-pay | Admitting: Family Medicine

## 2022-01-21 VITALS — BP 114/70 | HR 75 | Temp 98.1°F | Ht 78.0 in | Wt 252.6 lb

## 2022-01-21 DIAGNOSIS — J329 Chronic sinusitis, unspecified: Secondary | ICD-10-CM | POA: Diagnosis not present

## 2022-01-21 DIAGNOSIS — I483 Typical atrial flutter: Secondary | ICD-10-CM

## 2022-01-21 DIAGNOSIS — B9689 Other specified bacterial agents as the cause of diseases classified elsewhere: Secondary | ICD-10-CM

## 2022-01-21 DIAGNOSIS — Z5181 Encounter for therapeutic drug level monitoring: Secondary | ICD-10-CM

## 2022-01-21 MED ORDER — WARFARIN SODIUM 5 MG PO TABS: ORAL_TABLET | ORAL | 1 refills | Status: DC

## 2022-01-21 MED ORDER — METHYLPREDNISOLONE 4 MG PO TBPK: ORAL_TABLET | ORAL | 0 refills | Status: DC

## 2022-01-21 MED ORDER — AMOXICILLIN 500 MG PO CAPS: 500.0000 mg | ORAL_CAPSULE | Freq: Three times a day (TID) | ORAL | 0 refills | Status: AC

## 2022-01-21 NOTE — Telephone Encounter (Signed)
Prescription refill request received for warfarin Lov: 11/20/20 Mark Gallagher)  Next INR check: 01/29/22  Warfarin tablet strength: '5mg'$   Pt overdue to see cardiologist. Note placed on upcoming appt to have pt schedule appt with provider. Refill sent.

## 2022-01-21 NOTE — Telephone Encounter (Signed)
*  STAT* If patient is at the pharmacy, call can be transferred to refill team.   1. Which medications need to be refilled? (please list name of each medication and dose if known) warfarin (COUMADIN) 5 MG tablet   2. Which pharmacy/location (including street and city if local pharmacy) is medication to be sent to? [facility] - [place], [place]   3. Do they need a 30 day or 90 day supply? [place]

## 2022-01-22 NOTE — Progress Notes (Signed)
Acute Office Visit  Subjective:     Patient ID: Mark Gallagher, male    DOB: 1970-07-25, 52 y.o.   MRN: [id number]  Chief Complaint  Patient presents with   Cough    Pt states he has a runny nose, cough and congestion, pt had a sore throat but its gone now, pt has been taking mucinex, Robitussin cold and cough    HPI Patient is in today with runny nose, sore throat, congestion x 5 days and worsening. Has been using OTC Mucinex and Robitussion cold and cough that does not help much. No fever. No known sick contacts.   Review of Systems  Constitutional:  Negative for chills and fever.  HENT:  Positive for congestion and sore throat.   Respiratory:  Positive for cough.   Gastrointestinal: Negative.  Negative for nausea and vomiting.  Musculoskeletal: Negative.   Skin: Negative.   Neurological: Negative.   Endo/Heme/Allergies: Negative.   Psychiatric/Behavioral: Negative.      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   moderate atypia - left post knee - widershave (free)   Atypical nevus 08/17/2014   moderate atypia - right mid back    Atypical nevus 12/20/2015   moderate to severe atypia - upper mid back - widershave   Atypical nevus 09/18/2017   mild atypia - left anterior back   Atypical nevus 09/18/2017   moderate atypia - left posterior back   Atypical nevus 07/16/2018   marked atypical - right neck, lower   Blood transfusion without reported diagnosis    Cancer ([place])    atypical mole - no cancer per pt    History of pituitary tumor    Hypertension    pt denies - on metoprol for heart    Hypothyroidism    Knee pain, bilateral    Pneumonia    Testosterone deficiency    Thyroid disease     Social History   Socioeconomic History   Marital status: Single    Spouse name: Not on file   Number of children: Not on file   Years of  education: Not on file   Highest education level: Not on file  Occupational History   Not on file  Tobacco Use   Smoking status: Never   Smokeless tobacco: Never  Vaping Use   Vaping Use: Never used  Substance and Sexual Activity   Alcohol use: No   Drug use: No   Sexual activity: Not on file  Other Topics Concern   Not on file  Social History Narrative   Not on file   Social Determinants of Health   Financial Resource Strain: Not on file  Food Insecurity: Not on file  Transportation Needs: Not on file  Physical Activity: Not on file  Stress: Not on file  Social Connections: Not on file  Intimate Partner Violence: Not on file    Past Surgical History:  Procedure Laterality Date   [place] SURGERY  2007   Anterior C4/5 vertebrectomy and c3-C6 laminoplasty with plating   CRANIOTOMY FOR TUMOR     pituitary adenoma   SPINAL FUSION     2007, 2016 c3- t 2    THORACIC AORTIC ANEURYSM REPAIR     with Aortic valve  replacement    TOTAL HIP ARTHROPLASTY Right 03/16/2018   Procedure: TOTAL HIP ARTHROPLASTY Anterior;  Surgeon: Renette Butters, MD;  Location: [facility];  Service: Orthopedics;  Laterality: Right;    Family History  Problem Relation Age of Onset   High blood pressure Mother    Acute myelogenous leukemia Brother    Colon polyps Father    Colon cancer Neg Hx    Esophageal cancer Neg Hx    Rectal cancer Neg Hx    Stomach cancer Neg Hx     Allergies  Allergen Reactions   Oxycodone Nausea Only   Quinolones     Other reaction(s): Other (See Comments) Fluroquinolone antibiotics should be avoided in patients with history of aortic aneurysm/dissection ([email or web address])    Current Outpatient Medications on File Prior to Visit  Medication Sig Dispense Refill   aluminum chloride (DRYSOL) 20 % external solution Apply topically at bedtime. 35 mL 6   atorvastatin (LIPITOR) 40 MG  tablet Take 40 mg by mouth daily.     Cholecalciferol (VITAMIN D3) 25 MCG (1000 UT) CAPS Take 2,000 Units by mouth daily.      eplerenone (INSPRA) 25 MG tablet Take 25 mg by mouth daily.     ferrous sulfate 325 (65 FE) MG tablet Take 1 tablet (325 mg total) by mouth 2 (two) times daily with a meal. 60 tablet 1   hydrocortisone (CORTEF) 10 MG tablet Take 1 tablet daily.  You may take 2 tablets daily with excessive weakness and tiredness and lethargy. (Patient taking differently: Take 10 mg by mouth daily.) 60 tablet 0   levothyroxine (SYNTHROID, LEVOTHROID) 200 MCG tablet Take 1 tablet (200 mcg total) by mouth daily.     metoprolol succinate (TOPROL-XL) 25 MG 24 hr tablet Take 25 mg by mouth daily.     Multiple Vitamins-Minerals (CENTRUM SILVER PO) Take 1 tablet by mouth daily.     SOMAVERT 20 MG SOLR Inject into the skin.     benzonatate (TESSALON) 100 MG capsule Take 1 capsule (100 mg total) by mouth 3 (three) times daily as needed for cough. (Patient not taking: Reported on 12/19/2021) 20 capsule 0   polyethylene glycol (MIRALAX / GLYCOLAX) packet Take 17 g by mouth daily as needed for mild constipation. (Patient not taking: Reported on 12/19/2021)     promethazine (PHENERGAN) 25 MG tablet Take 1 tablet (25 mg total) by mouth every 6 (six) hours as needed for nausea or vomiting. (Patient not taking: Reported on 12/19/2021) 30 tablet 0   promethazine-dextromethorphan (PROMETHAZINE-DM) 6.25-15 MG/5ML syrup Take 5 mLs by mouth 4 (four) times daily as needed. (Patient not taking: Reported on 01/07/2021) 180 mL 0   Current Facility-Administered Medications on File Prior to Visit  Medication Dose Route Frequency Provider Last Rate Last Admin   octreotide (SANDOSTATIN LAR) IM injection 30 mg  30 mg Intramuscular Q28 days Wendie Agreste, MD   30 mg at 03/13/17 1609   testosterone cypionate (DEPOTESTOSTERONE CYPIONATE) injection 200 mg  200 mg Intramuscular Q14 Days Wendie Agreste, MD   200 mg at  10/08/21 1456   testosterone cypionate (DEPOTESTOSTERONE CYPIONATE) injection 200 mg  200 mg Intramuscular Q14 Days Wendie Agreste, MD   200 mg at 11/21/21 1113   testosterone cypionate (DEPOTESTOSTERONE CYPIONATE) injection 200 mg  200 mg Intramuscular Q14 Days Wendie Agreste, MD   200 mg at 12/05/21 1041   testosterone cypionate (DEPOTESTOSTERONE CYPIONATE) injection 200 mg  200 mg Intramuscular Q14 Days [name]  R, MD   200 mg at 01/15/22 1028    BP 114/70   Pulse 75   Temp 98.1 F (36.7 C)   Ht '6\' 6"'$  (1.981 m)   Wt 252 lb 9.6 oz (114.6 kg)   SpO2 97%   BMI 29.19 kg/m chart     Objective:    BP 114/70   Pulse 75   Temp 98.1 F (36.7 C)   Ht '6\' 6"'$  (1.981 m)   Wt 252 lb 9.6 oz (114.6 kg)   SpO2 97%   BMI 29.19 kg/m    Physical Exam  No results found for any visits on 01/21/22.      Assessment & Plan:   Problem List Items Addressed This Visit   None Visit Diagnoses     Bacterial sinusitis    -  Primary   Relevant Medications   amoxicillin (AMOXIL) 500 MG capsule   methylPREDNISolone (MEDROL DOSEPAK) 4 MG TBPK tablet       Meds ordered this encounter  Medications   amoxicillin (AMOXIL) 500 MG capsule    Sig: Take 1 capsule (500 mg total) by mouth 3 (three) times daily for 10 days.    Dispense:  30 capsule    Refill:  0    Order Specific Question:   Supervising Provider    Answer:   Penni Homans A [[id number]]   methylPREDNISolone (MEDROL DOSEPAK) 4 MG TBPK tablet    Sig: As directed    Dispense:  21 tablet    Refill:  0    Order Specific Question:   Supervising Provider    Answer:   Penni Homans A [[id number]]   Call the office is symptoms worsen or persist. Recheck as scheduled and sooner as needed.  No follow-ups on file.  Kennyth Arnold, FNP

## 2022-01-27 ENCOUNTER — Ambulatory Visit (INDEPENDENT_AMBULATORY_CARE_PROVIDER_SITE_OTHER): Payer: BLUE CROSS/BLUE SHIELD

## 2022-01-27 DIAGNOSIS — E291 Testicular hypofunction: Secondary | ICD-10-CM

## 2022-01-27 MED ORDER — TESTOSTERONE CYPIONATE 200 MG/ML IM SOLN
200.0000 mg | INTRAMUSCULAR | Status: DC
  Administered 2022-01-27: 200 mg via INTRAMUSCULAR

## 2022-01-27 NOTE — Progress Notes (Signed)
Pt came in for his testosterone injection . Tolerated injection well . Advised to return in 14 days for another injection . Gave 0.5 ml in right upper outer quadrant .

## 2022-01-28 ENCOUNTER — Ambulatory Visit: Payer: Medicare Other

## 2022-01-28 NOTE — Telephone Encounter (Signed)
error 

## 2022-01-29 ENCOUNTER — Ambulatory Visit: Payer: Medicare Other

## 2022-01-29 ENCOUNTER — Ambulatory Visit: Payer: BLUE CROSS/BLUE SHIELD

## 2022-02-03 ENCOUNTER — Ambulatory Visit: Payer: Medicare Other | Attending: Cardiology

## 2022-02-03 DIAGNOSIS — Z5181 Encounter for therapeutic drug level monitoring: Secondary | ICD-10-CM

## 2022-02-03 LAB — POCT INR: INR: 1.6 — AB (ref 2.0–3.0)

## 2022-02-03 NOTE — Patient Instructions (Signed)
Description   Continue taking Warfarin 3 tablets daily except for 2 tablets on Tuesdays and Saturdays. Recheck INR 6 weeks. [facility]madin Clinic [phone].       

## 2022-02-10 ENCOUNTER — Ambulatory Visit: Payer: BLUE CROSS/BLUE SHIELD

## 2022-02-11 ENCOUNTER — Ambulatory Visit (INDEPENDENT_AMBULATORY_CARE_PROVIDER_SITE_OTHER): Payer: Medicare Other

## 2022-02-11 DIAGNOSIS — E291 Testicular hypofunction: Secondary | ICD-10-CM

## 2022-02-11 MED ORDER — TESTOSTERONE CYPIONATE 200 MG/ML IM SOLN
200.0000 mg | INTRAMUSCULAR | Status: DC
  Administered 2022-02-11 – 2022-03-11 (×3): 200 mg via INTRAMUSCULAR

## 2022-02-11 NOTE — Progress Notes (Signed)
Pt came in for his Testosterone injection . Gave in left upper outer quadrant . Pt tolerated injection well

## 2022-02-25 ENCOUNTER — Encounter: Payer: Self-pay | Admitting: Podiatry

## 2022-02-25 ENCOUNTER — Ambulatory Visit: Payer: Medicare Other | Admitting: Podiatry

## 2022-02-25 ENCOUNTER — Ambulatory Visit (INDEPENDENT_AMBULATORY_CARE_PROVIDER_SITE_OTHER): Payer: Medicare Other

## 2022-02-25 DIAGNOSIS — E291 Testicular hypofunction: Secondary | ICD-10-CM

## 2022-02-25 DIAGNOSIS — D2372 Other benign neoplasm of skin of left lower limb, including hip: Secondary | ICD-10-CM

## 2022-02-25 DIAGNOSIS — M2040 Other hammer toe(s) (acquired), unspecified foot: Secondary | ICD-10-CM

## 2022-02-25 MED ORDER — TESTOSTERONE CYPIONATE 200 MG/ML IM SOLN
200.0000 mg | INTRAMUSCULAR | Status: DC
  Administered 2022-03-28 – 2022-05-30 (×5): 200 mg via INTRAMUSCULAR

## 2022-02-25 NOTE — Progress Notes (Signed)
  Subjective:  Patient ID: Mark Gallagher, male    DOB: 31-Mar-1970,   MRN: [id number]  Chief Complaint  Patient presents with   Toe Pain    Trim corn 4th toe left - any other options for this toe?    52 y.o. male presents for concern of lesion on the top of his fourth toe that he has been dealing with for years. Wondering about other treatment options . Denies any other pedal complaints. Denies n/v/f/c.   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   moderate atypia - left post knee - widershave (free)   Atypical nevus 08/17/2014   moderate atypia - right mid back    Atypical nevus 12/20/2015   moderate to severe atypia - upper mid back - widershave   Atypical nevus 09/18/2017   mild atypia - left anterior back   Atypical nevus 09/18/2017   moderate atypia - left posterior back   Atypical nevus 07/16/2018   marked atypical - right neck, lower   Blood transfusion without reported diagnosis    Cancer ([place])    atypical mole - no cancer per pt    History of pituitary tumor    Hypertension    pt denies - on metoprol for heart    Hypothyroidism    Knee pain, bilateral    Pneumonia    Testosterone deficiency    Thyroid disease     Objective:  Physical Exam: Vascular: DP/PT pulses 2/4 bilateral. CFT <3 seconds. Normal hair growth on digits. No edema.  Skin. No lacerations or abrasions bilateral feet. Hyperkeratotic cored lesion to dorsum of fourth digit.  Musculoskeletal: MMT 5/5 bilateral lower extremities in DF, PF, Inversion and Eversion. Deceased ROM in DF of ankle joint. Hammered digits 1-5 on the right Neurological: Sensation intact to light touch.   Assessment:   1. Benign neoplasm of skin of right foot   2. Hammer toe, unspecified laterality      Plan:  Patient was evaluated and treated and all questions answered. -Discussed lesions of  the skin with patient and treatment options.  -Hyperkeratotic tissue was debrided with chisel without incident.  -Applied salycylic acid treatment to area with dressing. Advised to remove bandaging tomorrow.  -Encouraged daily moisturizing -Discussed use of pumice stone -Toe cap provided.  -Advised good supportive shoes and inserts -Patient to return to office as needed or sooner if condition worsens.   Lorenda Peck, DPM

## 2022-02-25 NOTE — Progress Notes (Signed)
Pt came in for his testosterone injection . Gave injection in right upper outer quadrant pt tolerated well . Advised to return to clinic in 14 days for another injection

## 2022-03-11 ENCOUNTER — Ambulatory Visit (INDEPENDENT_AMBULATORY_CARE_PROVIDER_SITE_OTHER): Payer: Medicare Other

## 2022-03-11 DIAGNOSIS — E291 Testicular hypofunction: Secondary | ICD-10-CM | POA: Diagnosis not present

## 2022-03-11 NOTE — Progress Notes (Unsigned)
Pt presents for testosterone injection, administered without concern.

## 2022-03-12 ENCOUNTER — Ambulatory Visit: Payer: Medicare Other | Attending: Cardiovascular Disease | Admitting: Cardiovascular Disease

## 2022-03-12 ENCOUNTER — Encounter: Payer: Self-pay | Admitting: Cardiovascular Disease

## 2022-03-12 VITALS — BP 106/82 | HR 65 | Ht 78.0 in | Wt 254.0 lb

## 2022-03-12 DIAGNOSIS — E782 Mixed hyperlipidemia: Secondary | ICD-10-CM | POA: Diagnosis not present

## 2022-03-12 DIAGNOSIS — I7789 Other specified disorders of arteries and arterioles: Secondary | ICD-10-CM

## 2022-03-12 DIAGNOSIS — I10 Essential (primary) hypertension: Secondary | ICD-10-CM | POA: Diagnosis not present

## 2022-03-12 DIAGNOSIS — Z5181 Encounter for therapeutic drug level monitoring: Secondary | ICD-10-CM | POA: Diagnosis not present

## 2022-03-12 NOTE — Assessment & Plan Note (Signed)
History of essential hypertension with blood pressure measured today at 106/82.  He is not on antihypertensive medications.

## 2022-03-12 NOTE — Assessment & Plan Note (Signed)
History of hyperlipidemia on statin therapy with lipid profile performed 3 months ago revealing total cholesterol 115, LDL 53 and HDL 52.

## 2022-03-12 NOTE — Progress Notes (Signed)
03/12/2022 [name]   1970-02-16  [id number]  Primary Physician Mark Agreste, MD Primary Cardiologist: Mark Harp MD FACP, [place], [place], [place]  HPI:  Mark Gallagher is a 52 y.o.   thin and tall appearing single Caucasian male with no children who I last saw in the office 11/20/2020. He was referred by Dr. Shea Gallagher at primary care [place] to be established in our Coumadin clinic.  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  He had thoracic aortic aneurysm resection and grafting with mechanical aortic valve replacement by Dr. Ysidro Gallagher is at [facility] 10/14/2018.  He is on Coumadin anticoagulation.  He is recovered nicely.  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   Since I saw him a year ago he is done well.  He is very active and has a goal of walking 450 miles a year as well on the way to accomplishing this.   He was on an apixaban trial for his St. Jude's valve which was terminated because of adverse events.  He transitioned back to warfarin oral anticoagulation with INRs followed in our office..  Since I saw him he is remained asymptomatic.  Dr. Ysidro Gallagher sees him on an annual basis and checks an MRIs.    Current Meds  Medication Sig   aluminum chloride (DRYSOL) 20 % external solution Apply topically at bedtime.   atorvastatin (LIPITOR) 40 MG tablet Take 40 mg by mouth daily.   Cholecalciferol (VITAMIN D3) 25 MCG (1000 UT) CAPS Take 2,000 Units by mouth daily.    eplerenone (INSPRA) 25 MG tablet Take 25 mg by mouth daily.   ferrous sulfate 325 (65 FE) MG tablet Take 1 tablet (325 mg total) by mouth 2 (two) times daily with a meal.   hydrocortisone (CORTEF) 10 MG tablet Take 1 tablet daily.  You may take 2 tablets daily with excessive weakness and tiredness and lethargy. (Patient taking differently: Take 10 mg by mouth daily.)   levothyroxine (SYNTHROID, LEVOTHROID) 200 MCG tablet Take 1  tablet (200 mcg total) by mouth daily.   metoprolol succinate (TOPROL-XL) 25 MG 24 hr tablet Take 25 mg by mouth daily.   Multiple Vitamins-Minerals (CENTRUM SILVER PO) Take 1 tablet by mouth daily.   polyethylene glycol (MIRALAX / GLYCOLAX) packet Take 17 g by mouth daily as needed for mild constipation.   SOMAVERT 20 MG SOLR Inject into the skin.   warfarin (COUMADIN) 5 MG tablet TAKE 3 TABLETS BY MOUTH ONCE DAILY EXCEPT FOR  2 TABLETS ON TUESDAYS AND SATURDAYS   Current Facility-Administered Medications for the 03/12/22 encounter (Office Visit) with Mark Harp, MD  Medication   octreotide (SANDOSTATIN LAR) IM injection 30 mg   testosterone cypionate (DEPOTESTOSTERONE CYPIONATE) injection 200 mg   testosterone cypionate (DEPOTESTOSTERONE CYPIONATE) injection 200 mg   testosterone cypionate (DEPOTESTOSTERONE CYPIONATE) injection 200 mg     Allergies  Allergen Reactions   Oxycodone Nausea Only   Quinolones     Other reaction(s): Other (See Comments) Fluroquinolone antibiotics should be avoided in patients with history of aortic aneurysm/dissection ([email or web address])    Social History   Socioeconomic History   Marital status: Single    Spouse name: Not on file   Number of children: Not on file   Years of education: Not on file   Highest education level: Not on file  Occupational History  Not on file  Tobacco Use   Smoking status: Never   Smokeless tobacco: Never  Vaping Use   Vaping Use: Never used  Substance and Sexual Activity   Alcohol use: No   Drug use: No   Sexual activity: Not on file  Other Topics Concern   Not on file  Social History Narrative   Not on file   Social Determinants of Health   Financial Resource Strain: Not on file  Food Insecurity: Not on file  Transportation Needs: Not on file  Physical Activity: Not on file  Stress: Not on file  Social Connections: Not on file  Intimate Partner Violence: Not on  file     Review of Systems: General: negative for chills, fever, night sweats or weight changes.  Cardiovascular: negative for chest pain, dyspnea on exertion, edema, orthopnea, palpitations, paroxysmal nocturnal dyspnea or shortness of breath Dermatological: negative for rash Respiratory: negative for cough or wheezing Urologic: negative for hematuria Abdominal: negative for nausea, vomiting, diarrhea, bright red blood per rectum, melena, or hematemesis Neurologic: negative for visual changes, syncope, or dizziness All other systems reviewed and are otherwise negative except as noted above.    Blood pressure 106/82, pulse 65, height 6' 6"$  (1.981 m), weight 254 lb (115.2 kg), SpO2 97 %.  General appearance: alert and no distress Neck: no adenopathy, no JVD, supple, symmetrical, trachea midline, thyroid not enlarged, symmetric, no tenderness/mass/nodules, and soft bilateral carotid bruits versus transmitted murmur Lungs: clear to auscultation bilaterally Heart: Crisp mechanical prosthetic valve sounds with a soft outflow tract murmur. Extremities: extremities normal, atraumatic, no cyanosis or edema Pulses: 2+ and symmetric Skin: Skin color, texture, turgor normal. No rashes or lesions Neurologic: Grossly normal  EKG sinus rhythm at 65 with right bundle branch block unchanged from prior EKGs.  I personally reviewed this EKG.  ASSESSMENT AND PLAN:   Hyperlipidemia History of hyperlipidemia on statin therapy with lipid profile performed 3 months ago revealing total cholesterol 115, LDL 53 and HDL 52.  HTN (hypertension) History of essential hypertension with blood pressure measured today at 106/82.  He is not on antihypertensive medications.  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 Ysidro Gallagher at [facility] 10/10/2018.  He  is followed at [facility] on a regular basis with 2D echoes and MRIs.  The last MRI I saw was 02/01/2021 revealing a severely enlarged ventricle with an EF of 46% with a mildly enlarged RV, biatrial enlargement and a well-functioning aortic mechanical prosthesis.  There is mild MR.  The ascending graft measured 4.1 x 3.9 cm.     Mark Harp MD FACP,FACC,FAHA, [facility] 03/12/2022 11:14 AM

## 2022-03-12 NOTE — Assessment & Plan Note (Signed)
History of acromegaly with enlarged aortic root status post resection and grafting with mechanical aortic valve replacement using a Saint Jude valve by Dr. Ysidro Evert at [facility] 10/10/2018.  He is followed at [facility] on a regular basis with 2D echoes and MRIs.  The last MRI I saw was 02/01/2021 revealing a severely enlarged ventricle with an EF of 46% with a mildly enlarged RV, biatrial enlargement and a well-functioning aortic mechanical prosthesis.  There is mild MR.  The ascending graft measured 4.1 x 3.9 cm.

## 2022-03-12 NOTE — Patient Instructions (Signed)
Medication Instructions:  Your physician recommends that you continue on your current medications as directed. Please refer to the Current Medication list given to you today.  *If you need a refill on your cardiac medications before your next appointment, please call your pharmacy*   Follow-Up: At [facility], you and your health needs are our priority.  As part of our continuing mission to provide you with exceptional heart care, we have created designated Provider Care Teams.  These Care Teams include your primary Cardiologist (physician) and Advanced Practice Providers (APPs -  Physician Assistants and Nurse Practitioners) who all work together to provide you with the care you need, when you need it.  We recommend signing up for the patient portal called "MyChart".  Sign up information is provided on this After Visit Summary.  MyChart is used to connect with patients for Virtual Visits (Telemedicine).  Patients are able to view lab/test results, encounter notes, upcoming appointments, etc.  Non-urgent messages can be sent to your provider as well.   To learn more about what you can do with MyChart, go to [email or web address].    Your next appointment:   2 year(s)  Provider:   Quay Burow, MD

## 2022-03-17 ENCOUNTER — Ambulatory Visit: Payer: Medicare Other | Attending: Cardiovascular Disease

## 2022-03-17 DIAGNOSIS — Z5181 Encounter for therapeutic drug level monitoring: Secondary | ICD-10-CM

## 2022-03-17 LAB — POCT INR: INR: 1.7 — AB (ref 2.0–3.0)

## 2022-03-17 NOTE — Patient Instructions (Addendum)
Description   Continue taking Warfarin 3 tablets daily except for 2 tablets on Tuesdays and Saturdays. Recheck INR 6 weeks. Coumadin Clinic [phone].

## 2022-03-25 DIAGNOSIS — I428 Other cardiomyopathies: Secondary | ICD-10-CM | POA: Diagnosis not present

## 2022-03-25 DIAGNOSIS — I483 Typical atrial flutter: Secondary | ICD-10-CM | POA: Diagnosis not present

## 2022-03-25 DIAGNOSIS — I10 Essential (primary) hypertension: Secondary | ICD-10-CM | POA: Diagnosis not present

## 2022-03-25 DIAGNOSIS — E785 Hyperlipidemia, unspecified: Secondary | ICD-10-CM | POA: Diagnosis not present

## 2022-03-26 ENCOUNTER — Ambulatory Visit: Payer: Medicare Other

## 2022-03-27 ENCOUNTER — Encounter: Payer: Self-pay | Admitting: Podiatry

## 2022-03-27 ENCOUNTER — Ambulatory Visit: Payer: Medicare Other | Admitting: Podiatry

## 2022-03-27 DIAGNOSIS — M79676 Pain in unspecified toe(s): Secondary | ICD-10-CM | POA: Diagnosis not present

## 2022-03-27 DIAGNOSIS — D2372 Other benign neoplasm of skin of left lower limb, including hip: Secondary | ICD-10-CM

## 2022-03-27 DIAGNOSIS — D2371 Other benign neoplasm of skin of right lower limb, including hip: Secondary | ICD-10-CM

## 2022-03-27 DIAGNOSIS — B351 Tinea unguium: Secondary | ICD-10-CM

## 2022-03-27 NOTE — Progress Notes (Signed)
Mark Gallagher presents today for a chief complaint of painful elongated toenails and painful callus fourth digit of the left foot states that since he has purchased new tennis shoes and wears a silicone sleeve he is doing much better.  Objective: Vital signs stable alert oriented x 3 there is no erythema cellulitis drainage or hemorrhage deformity still present bilateral but much decrease in their height from previous evaluations.  Toenails are long thick yellow dystrophic onychomycotic painful callus on PIPJ dorsal aspect fourth toe left foot.  Assessment: Pain in limb secondary onychomycosis benign skin lesion.  Plan: Debridement of benign skin lesion previous toenails 1 through 5 bilateral

## 2022-03-28 ENCOUNTER — Ambulatory Visit (INDEPENDENT_AMBULATORY_CARE_PROVIDER_SITE_OTHER): Payer: Medicare Other

## 2022-03-28 DIAGNOSIS — E291 Testicular hypofunction: Secondary | ICD-10-CM | POA: Diagnosis not present

## 2022-03-28 MED ORDER — TESTOSTERONE CYPIONATE 200 MG/ML IM SOLN: 200.0000 mg | Freq: Once | INTRAMUSCULAR | 0 refills | Status: DC

## 2022-03-28 NOTE — Progress Notes (Signed)
Pt came in for his trestolone injection . Gave injection upper outer left quadrant . Pt advised to return in 14 days to receive his injection .

## 2022-04-02 ENCOUNTER — Telehealth: Payer: Self-pay | Admitting: Family Medicine

## 2022-04-02 NOTE — Telephone Encounter (Signed)
Contacted Mark Gallagher to schedule their annual wellness visit. Welcome to Medicare visit Due by  12/21/2022 .  Thank you,  [place] Direct dial  [phone]

## 2022-04-11 ENCOUNTER — Ambulatory Visit: Payer: Medicare Other

## 2022-04-14 ENCOUNTER — Ambulatory Visit (INDEPENDENT_AMBULATORY_CARE_PROVIDER_SITE_OTHER): Payer: Medicare Other

## 2022-04-14 DIAGNOSIS — E291 Testicular hypofunction: Secondary | ICD-10-CM | POA: Diagnosis not present

## 2022-04-14 NOTE — Progress Notes (Signed)
Patient presented today for biweekly testosterone injection, no concerns, patient is well

## 2022-04-28 ENCOUNTER — Ambulatory Visit: Payer: Medicare Other | Attending: Cardiovascular Disease | Admitting: *Deleted

## 2022-04-28 DIAGNOSIS — Z5181 Encounter for therapeutic drug level monitoring: Secondary | ICD-10-CM

## 2022-04-28 LAB — POCT INR: POC INR: 2.4

## 2022-04-28 NOTE — Patient Instructions (Signed)
Description   Take 1/2 a tablet of warfarin tomorrow, then continue taking Warfarin 3 tablets daily except for 2 tablets on Tuesdays and Saturdays. Recheck INR 4 weeks. Coumadin Clinic [phone].

## 2022-04-29 ENCOUNTER — Ambulatory Visit (INDEPENDENT_AMBULATORY_CARE_PROVIDER_SITE_OTHER): Payer: Medicare Other

## 2022-04-29 DIAGNOSIS — E291 Testicular hypofunction: Secondary | ICD-10-CM | POA: Diagnosis not present

## 2022-04-29 NOTE — Progress Notes (Signed)
Patient presented for injection no issues

## 2022-05-01 ENCOUNTER — Encounter: Payer: Self-pay | Admitting: Podiatry

## 2022-05-01 ENCOUNTER — Ambulatory Visit: Payer: Medicare Other | Admitting: Podiatry

## 2022-05-01 DIAGNOSIS — D2372 Other benign neoplasm of skin of left lower limb, including hip: Secondary | ICD-10-CM

## 2022-05-01 DIAGNOSIS — B351 Tinea unguium: Secondary | ICD-10-CM | POA: Diagnosis not present

## 2022-05-01 DIAGNOSIS — D2371 Other benign neoplasm of skin of right lower limb, including hip: Secondary | ICD-10-CM | POA: Diagnosis not present

## 2022-05-01 DIAGNOSIS — M79676 Pain in unspecified toe(s): Secondary | ICD-10-CM

## 2022-05-01 NOTE — Progress Notes (Signed)
He presents today chief complaint of painful elongated toenails and calluses bilaterally.  Objective: Vital signs stable he is alert and oriented x 3 pulses are palpable.  Hammertoe deformities resulting in dorsal lesions at the level of the PIPJ to the third digit of the left foot at the fourth digit of the right foot.  No signs of infection.  Toenails are long thick yellow dystrophic onychomycotic.  Assessment: Pain secondary to onychomycosis benign skin lesions.  Plan: Debrided nails 1 through 5 bilateral and debrided benign skin lesions.  The patient open

## 2022-05-13 ENCOUNTER — Ambulatory Visit: Payer: Medicare Other

## 2022-05-15 ENCOUNTER — Ambulatory Visit: Payer: Medicare Other

## 2022-05-16 ENCOUNTER — Ambulatory Visit (INDEPENDENT_AMBULATORY_CARE_PROVIDER_SITE_OTHER): Payer: Medicare Other

## 2022-05-16 DIAGNOSIS — E291 Testicular hypofunction: Secondary | ICD-10-CM

## 2022-05-16 NOTE — Progress Notes (Signed)
Pt presented for testosterone injection today, no concerns

## 2022-05-26 ENCOUNTER — Ambulatory Visit: Payer: Medicare Other | Attending: Cardiovascular Disease

## 2022-05-26 DIAGNOSIS — Z5181 Encounter for therapeutic drug level monitoring: Secondary | ICD-10-CM | POA: Diagnosis not present

## 2022-05-26 LAB — POCT INR: INR: 1.7 — AB (ref 2.0–3.0)

## 2022-05-26 NOTE — Patient Instructions (Signed)
Description   Continue taking Warfarin 3 tablets daily except for 2 tablets on Tuesdays and Saturdays. Recheck INR 5 weeks.  Coumadin Clinic [phone].

## 2022-05-27 ENCOUNTER — Ambulatory Visit: Payer: Medicare Other

## 2022-05-30 ENCOUNTER — Ambulatory Visit: Payer: Medicare Other

## 2022-05-30 ENCOUNTER — Ambulatory Visit (INDEPENDENT_AMBULATORY_CARE_PROVIDER_SITE_OTHER): Payer: Medicare Other | Admitting: Family Medicine

## 2022-05-30 ENCOUNTER — Encounter: Payer: Self-pay | Admitting: Family Medicine

## 2022-05-30 VITALS — BP 110/78 | HR 60 | Temp 98.8°F | Ht 78.0 in | Wt 250.6 lb

## 2022-05-30 DIAGNOSIS — J309 Allergic rhinitis, unspecified: Secondary | ICD-10-CM

## 2022-05-30 DIAGNOSIS — E291 Testicular hypofunction: Secondary | ICD-10-CM

## 2022-05-30 DIAGNOSIS — H938X1 Other specified disorders of right ear: Secondary | ICD-10-CM | POA: Diagnosis not present

## 2022-05-30 MED ORDER — FLUTICASONE PROPIONATE 50 MCG/ACT NA SUSP: 1.0000 | Freq: Every day | NASAL | 2 refills | Status: DC

## 2022-05-30 NOTE — Patient Instructions (Addendum)
You may have a small amount of fluid behind the ear - may be from allergies. Try flonase nasal spray for next week. Let me know if that does not help and I can refer you to ENT. Keep me updated.

## 2022-05-30 NOTE — Progress Notes (Signed)
Subjective:  Patient ID: Mark Gallagher, male    DOB: 06-19-1970  Age: 51 y.o. MRN: [id number]  CC:  Chief Complaint  Patient presents with   Ear Pain    Pt states ear discomfort, the feeling of water being trapped inside or insect.     HPI Mark Gallagher presents for   R ear discomfort: ? Water or wax buildup.  No pain, just feels full - past 8 days.  Minimal change in hearing - blocks and unblocks.  No attempted tx No fever/jaw pain.  Some sneezing, allergy symptoms, nasal congestion/runny nose. No current allergy meds.  Hx of WPW. Avoiding decongestants.   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   Hyperlipidemia 11/20/2020   Thoracic aortic aneurysm (HCC) 11/16/2018   Hiccups 10/17/2018   Thrombocytopenia (HCC) 10/14/2018   Dyslipidemia 10/11/2018   HTN (hypertension) 10/11/2018   Enlargement of aortic root (HCC) 04/06/2018   Chronic adrenal insufficiency (HCC) 03/26/2018   Hyponatremia 03/26/2018   Anasarca 03/26/2018   Hyperbilirubinemia 03/26/2018   Shock circulatory (HCC)    Primary localized osteoarthritis of hip 03/16/2018   S/P total hip arthroplasty 03/16/2018   Hypothyroidism 02/22/2018   History of fusion of cervical spine 02/22/2018   Iron deficiency anemia 02/22/2018   Acquired left foot drop 02/22/2018   Primary osteoarthritis of right hip 02/22/2018   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   Constipation 12/15/2016   Traumatic closed fracture of distal clavicle with minimal displacement, left, with routine healing, subsequent encounter 03/24/2016   Left knee pain 09/25/2015   Frequent PVCs 03/13/2015   Instability of left knee joint 10/20/2014   Adjustment disorder with depressed mood    Tetraplegia (HCC) 09/19/2014   Acute blood loss anemia 09/19/2014   Cervical spondylosis with myelopathy 09/18/2014   Typical atrial flutter (HCC) 09/14/2014    Radiculopathy of arm 08/28/2014   Hypogonadism male 09/08/2013   Testosterone deficiency 01/29/2012   Acromegaly (HCC) 04/01/2011   Cardiomyopathy 04/01/2011   Wolff-Parkinson-White (WPW) syndrome 04/01/2011   Past Medical History:  Diagnosis Date   Acromegaly (HCC)    Allergy    ? 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   moderate atypia - left post knee - widershave (free)   Atypical nevus 08/17/2014   moderate atypia - right mid back    Atypical nevus 12/20/2015   moderate to severe atypia - upper mid back - widershave   Atypical nevus 09/18/2017   mild atypia - left anterior back   Atypical nevus 09/18/2017   moderate atypia - left posterior back   Atypical nevus 07/16/2018   marked atypical - right neck, lower   Blood transfusion without reported diagnosis    Cancer (HCC)    atypical mole - no cancer per pt    History of pituitary tumor    Hypertension    pt denies - on metoprol for heart    Hypothyroidism    Knee pain, bilateral    Pneumonia    Testosterone deficiency    Thyroid disease    Past Surgical History:  Procedure Laterality Date   CARDIAC ELECTROPHYSIOLOGY STUDY AND ABLATION     CERVICAL DISC SURGERY  2007   Anterior C4/5 vertebrectomy and c3-C6 laminoplasty with plating   CRANIOTOMY FOR TUMOR     pituitary adenoma  SPINAL FUSION     2007, 2016 c3- t 2    THORACIC AORTIC ANEURYSM REPAIR     with Aortic valve replacement    TOTAL HIP ARTHROPLASTY Right 03/16/2018   Procedure: TOTAL HIP ARTHROPLASTY Anterior;  Surgeon: Sheral Apley, MD;  Location: [facility];  Service: Orthopedics;  Laterality: Right;   Allergies  Allergen Reactions   Oxycodone Nausea Only   Quinolones     Other reaction(s): Other (See Comments) Fluroquinolone antibiotics should be avoided in patients with history of aortic aneurysm/dissection ([email or web address])   Prior to Admission  medications   Medication Sig Start Date End Date Taking? Authorizing Provider  aluminum chloride (DRYSOL) 20 % external solution Apply topically at bedtime. 08/23/20  Yes Sheffield, Kelli R, PA-C  atorvastatin (LIPITOR) 40 MG tablet Take 40 mg by mouth daily.   Yes [provider]  benzonatate (TESSALON) 100 MG capsule Take 1 capsule (100 mg total) by mouth 3 (three) times daily as needed for cough. 01/10/21  Yes Shade Flood, MD  Cholecalciferol (VITAMIN D3) 25 MCG (1000 UT) CAPS Take 2,000 Units by mouth daily.    Yes [provider]  eplerenone (INSPRA) 25 MG tablet Take 25 mg by mouth daily. 07/09/20  Yes [provider]  ferrous sulfate 325 (65 FE) MG tablet Take 1 tablet (325 mg total) by mouth 2 (two) times daily with a meal. 10/05/14  Yes Love, Evlyn Kanner, PA-C  hydrocortisone (CORTEF) 10 MG tablet Take 1 tablet daily.  You may take 2 tablets daily with excessive weakness and tiredness and lethargy. Patient taking differently: Take 10 mg by mouth daily. 03/23/18  Yes Alwyn Ren, MD  levothyroxine (SYNTHROID, LEVOTHROID) 200 MCG tablet Take 1 tablet (200 mcg total) by mouth daily. 10/05/14  Yes Love, Evlyn Kanner, PA-C  methylPREDNISolone (MEDROL DOSEPAK) 4 MG TBPK tablet As directed 01/21/22  Yes Worthy Rancher B, FNP  metoprolol succinate (TOPROL-XL) 25 MG 24 hr tablet Take 25 mg by mouth daily. 06/13/19  Yes [provider]  Multiple Vitamins-Minerals (CENTRUM SILVER PO) Take 1 tablet by mouth daily.   Yes [provider]  polyethylene glycol (MIRALAX / GLYCOLAX) packet Take 17 g by mouth daily as needed for mild constipation.   Yes [provider]  promethazine (PHENERGAN) 25 MG tablet Take 1 tablet (25 mg total) by mouth every 6 (six) hours as needed for nausea or vomiting. 01/07/21  Yes Willow Ora, MD  promethazine-dextromethorphan (PROMETHAZINE-DM) 6.25-15 MG/5ML syrup Take 5 mLs by mouth 4 (four) times daily as needed. 11/29/20   Yes Sheliah Hatch, MD  SOMAVERT 20 MG SOLR Inject into the skin. 07/26/20  Yes [provider]  warfarin (COUMADIN) 5 MG tablet TAKE 3 TABLETS BY MOUTH ONCE DAILY EXCEPT FOR  2 TABLETS ON TUESDAYS AND SATURDAYS 01/21/22  Yes Runell Gess, MD   Social History   Socioeconomic History   Marital status: Single    Spouse name: Not on file   Number of children: Not on file   Years of education: Not on file   Highest education level: Not on file  Occupational History   Not on file  Tobacco Use   Smoking status: Never   Smokeless tobacco: Never  Vaping Use   Vaping Use: Never used  Substance and Sexual Activity   Alcohol use: No   Drug use: No   Sexual activity: Not on file  Other Topics Concern   Not on file  Social History Narrative  Not on file   Social Determinants of Health   Financial Resource Strain: Not on file  Food Insecurity: Not on file  Transportation Needs: Not on file  Physical Activity: Not on file  Stress: Not on file  Social Connections: Not on file  Intimate Partner Violence: Not on file    Review of Systems Per HPI  Objective:   Vitals:   05/30/22 1127  BP: 110/78  Pulse: 60  Temp: 98.8 F (37.1 C)  TempSrc: Temporal  SpO2: 97%  Weight: 250 lb 9.6 oz (113.7 kg)  Height: 6\' 6"  (1.981 m)     Physical Exam Vitals reviewed.  Constitutional:      Appearance: He is well-developed.  HENT:     Head: Normocephalic and atraumatic.     Right Ear: Tympanic membrane, ear canal and external ear normal.     Left Ear: Tympanic membrane, ear canal and external ear normal.     Ears:     Comments: Weber - localizes left - minimal difference.  Rinne - AC>BC on R  (10s vs 5s).  R TM slightly dull, no erythema, difficulty visualizing base of TM.     Nose: No rhinorrhea.     Mouth/Throat:     Pharynx: No oropharyngeal exudate or posterior oropharyngeal erythema.  Eyes:     Conjunctiva/sclera: Conjunctivae normal.     Pupils: Pupils are  equal, round, and reactive to light.  Cardiovascular:     Rate and Rhythm: Normal rate and regular rhythm.     Heart sounds: Normal heart sounds. No murmur heard. Pulmonary:     Effort: Pulmonary effort is normal.     Breath sounds: Normal breath sounds. No wheezing, rhonchi or rales.  Abdominal:     Palpations: Abdomen is soft.     Tenderness: There is no abdominal tenderness.  Musculoskeletal:     Cervical back: Neck supple.  Lymphadenopathy:     Cervical: No cervical adenopathy.  Skin:    General: Skin is warm and dry.     Findings: No rash.  Neurological:     Mental Status: He is alert and oriented to person, place, and time.  Psychiatric:        Behavior: Behavior normal.    Assessment & Plan:  Mark Gallagher is a 52 y.o. male . Allergic rhinitis, unspecified seasonality, unspecified trigger - Plan: fluticasone (FLONASE) 50 MCG/ACT nasal spray  Ear fullness, right  Suspected eustachian tube dysfunction, effusion as cause of intermittent ear symptoms.  Canal clear, no erythema or discolored fluid seen within the visualized portion of TM but somewhat difficult exam.  Intermittent slight change in hearing.  Recent allergy symptoms likely contributing.  Trial of Flonase, consider ENT eval if not improving.  RTC precautions, update in the next 1 week.  Meds ordered this encounter  Medications   fluticasone (FLONASE) 50 MCG/ACT nasal spray    Sig: Place 1-2 sprays into both nostrils daily.    Dispense:  16 g    Refill:  2   Patient Instructions  You may have a small amount of fluid behind the ear - may be from allergies. Try flonase nasal spray for next week. Let me know if that does not help and I can refer you to ENT. Keep me updated.       Signed,   Meredith Staggers, MD [facility], [facility] 05/30/22 1:55 PM

## 2022-06-05 ENCOUNTER — Encounter: Payer: Self-pay | Admitting: Podiatry

## 2022-06-05 ENCOUNTER — Ambulatory Visit: Payer: Medicare Other | Admitting: Podiatry

## 2022-06-05 DIAGNOSIS — D2371 Other benign neoplasm of skin of right lower limb, including hip: Secondary | ICD-10-CM

## 2022-06-05 DIAGNOSIS — M79676 Pain in unspecified toe(s): Secondary | ICD-10-CM

## 2022-06-05 DIAGNOSIS — D2372 Other benign neoplasm of skin of left lower limb, including hip: Secondary | ICD-10-CM | POA: Diagnosis not present

## 2022-06-05 DIAGNOSIS — B351 Tinea unguium: Secondary | ICD-10-CM | POA: Diagnosis not present

## 2022-06-05 NOTE — Progress Notes (Signed)
He presents today chief complaint of painful elongated toenails and calluses bilateral foot.  Objective: Vital signs stable tellurian x 3.  Palpable.  The toenails are thick yellow dystrophic mycotic benign skin lesions dorsal aspect of the PIPJ's secondary to hammertoe deformities bilateral.  Assessment: Pain in limb secondary to onychomycosis and hammertoe deformities with benign skin lesions.  Plan: Debridement of benign skin lesion debridement of toenails toenails 1 through 5 bilateral.  Follow-up with him in 5 weeks

## 2022-06-13 ENCOUNTER — Ambulatory Visit: Payer: Medicare Other

## 2022-06-17 ENCOUNTER — Ambulatory Visit (INDEPENDENT_AMBULATORY_CARE_PROVIDER_SITE_OTHER): Payer: Medicare Other

## 2022-06-17 DIAGNOSIS — E291 Testicular hypofunction: Secondary | ICD-10-CM | POA: Diagnosis not present

## 2022-06-17 MED ORDER — TESTOSTERONE CYPIONATE 200 MG/ML IM SOLN
200.0000 mg | INTRAMUSCULAR | Status: DC
Start: 2022-06-17 — End: 2023-03-05
  Administered 2022-06-17 – 2022-08-29 (×6): 200 mg via INTRAMUSCULAR

## 2022-06-17 NOTE — Progress Notes (Signed)
Gave pt 0.5 mL of testosterone injection in right upper outer quadrant . Pt tolerated well . Pt states he did not want this injection in his Vastus lateris any more due to pain . Advised pt to return to clinic in 14 days for his next injection

## 2022-06-19 DIAGNOSIS — K080 Exfoliation of teeth due to systemic causes: Secondary | ICD-10-CM | POA: Diagnosis not present

## 2022-06-30 ENCOUNTER — Ambulatory Visit: Payer: Medicare Other | Attending: Internal Medicine

## 2022-06-30 DIAGNOSIS — Z5181 Encounter for therapeutic drug level monitoring: Secondary | ICD-10-CM | POA: Diagnosis not present

## 2022-06-30 LAB — POCT INR: INR: 1.9 — AB (ref 2.0–3.0)

## 2022-06-30 NOTE — Patient Instructions (Signed)
Continue taking Warfarin 3 tablets daily except for 2 tablets on Tuesdays and Saturdays. Recheck INR 6 weeks.  Coumadin Clinic [phone].

## 2022-07-01 ENCOUNTER — Ambulatory Visit (INDEPENDENT_AMBULATORY_CARE_PROVIDER_SITE_OTHER): Payer: Medicare Other | Admitting: Family Medicine

## 2022-07-01 DIAGNOSIS — E291 Testicular hypofunction: Secondary | ICD-10-CM | POA: Diagnosis not present

## 2022-07-01 NOTE — Progress Notes (Signed)
Patient presented today for testosterone injection no concerns expressed. Patient elected to go back to ventral gluteal injection rather than the outer aspect of the thigh. Notes this is more comfortable.

## 2022-07-10 ENCOUNTER — Ambulatory Visit: Payer: Medicare Other | Admitting: Family Medicine

## 2022-07-10 ENCOUNTER — Ambulatory Visit: Payer: Medicare Other | Admitting: Podiatry

## 2022-07-15 ENCOUNTER — Ambulatory Visit (INDEPENDENT_AMBULATORY_CARE_PROVIDER_SITE_OTHER): Payer: Medicare Other

## 2022-07-15 DIAGNOSIS — E291 Testicular hypofunction: Secondary | ICD-10-CM | POA: Diagnosis not present

## 2022-07-15 NOTE — Progress Notes (Signed)
Gave 1 ml of Testerone left upper -outer quadrant   Pt had no concerns or complaints

## 2022-07-17 ENCOUNTER — Other Ambulatory Visit: Payer: Self-pay | Admitting: *Deleted

## 2022-07-17 ENCOUNTER — Telehealth: Payer: Self-pay | Admitting: Cardiovascular Disease

## 2022-07-17 DIAGNOSIS — I483 Typical atrial flutter: Secondary | ICD-10-CM

## 2022-07-17 DIAGNOSIS — Z5181 Encounter for therapeutic drug level monitoring: Secondary | ICD-10-CM

## 2022-07-17 MED ORDER — WARFARIN SODIUM 5 MG PO TABS
ORAL_TABLET | ORAL | 1 refills | Status: DC
Start: 2022-07-17 — End: 2023-01-12

## 2022-07-17 NOTE — Telephone Encounter (Signed)
Warfarin refill sent; unable to send a year supply but 6 months supply sent at this time just like previous.

## 2022-07-17 NOTE — Telephone Encounter (Signed)
*  STAT* If patient is at the pharmacy, call can be transferred to refill team.   1. Which medications need to be refilled? (please list name of each medication and dose if known) warfarin (COUMADIN) 5 MG tablet  2. Which pharmacy/location (including street and city if local pharmacy) is medication to be sent to? [facility] - [place], [place] - [address]  3. Do they need a 30 day or 90 day supply? 90 day  Pt states that he gets a year supply of the above medication and would like another refill for a year supply.  Please advise.

## 2022-07-17 NOTE — Telephone Encounter (Signed)
Warfarin 5mg  Aortic Valve replaced Last INR 06/30/22 Last OV 03/12/22

## 2022-07-30 ENCOUNTER — Ambulatory Visit: Payer: Medicare Other

## 2022-07-31 ENCOUNTER — Ambulatory Visit (INDEPENDENT_AMBULATORY_CARE_PROVIDER_SITE_OTHER): Payer: Medicare Other | Admitting: Family Medicine

## 2022-07-31 DIAGNOSIS — E291 Testicular hypofunction: Secondary | ICD-10-CM | POA: Diagnosis not present

## 2022-07-31 NOTE — Progress Notes (Signed)
Pt presented today for testosterone injection.

## 2022-08-04 DIAGNOSIS — E291 Testicular hypofunction: Secondary | ICD-10-CM

## 2022-08-07 DIAGNOSIS — H524 Presbyopia: Secondary | ICD-10-CM | POA: Diagnosis not present

## 2022-08-12 ENCOUNTER — Ambulatory Visit: Payer: Medicare Other | Admitting: *Deleted

## 2022-08-12 DIAGNOSIS — Z952 Presence of prosthetic heart valve: Secondary | ICD-10-CM | POA: Diagnosis not present

## 2022-08-12 DIAGNOSIS — Z5181 Encounter for therapeutic drug level monitoring: Secondary | ICD-10-CM

## 2022-08-12 LAB — POCT INR: INR: 2.1 (ref 2.0–3.0)

## 2022-08-12 NOTE — Patient Instructions (Signed)
Description   Tomorrow take 2 tablets of warfarin then continue taking Warfarin 3 tablets daily except for 2 tablets on Tuesdays and Saturdays. Recheck INR 6 weeks.  Coumadin Clinic [phone].

## 2022-08-14 ENCOUNTER — Ambulatory Visit: Payer: Medicare Other | Admitting: Podiatry

## 2022-08-14 ENCOUNTER — Ambulatory Visit: Payer: Medicare Other

## 2022-08-15 ENCOUNTER — Ambulatory Visit (INDEPENDENT_AMBULATORY_CARE_PROVIDER_SITE_OTHER): Payer: Medicare Other

## 2022-08-15 DIAGNOSIS — E291 Testicular hypofunction: Secondary | ICD-10-CM

## 2022-08-15 NOTE — Progress Notes (Signed)
Patient presented for testosterone injection today no concerns expressed

## 2022-08-19 ENCOUNTER — Ambulatory Visit: Payer: Medicare Other | Admitting: Podiatry

## 2022-08-19 DIAGNOSIS — D2371 Other benign neoplasm of skin of right lower limb, including hip: Secondary | ICD-10-CM | POA: Diagnosis not present

## 2022-08-19 DIAGNOSIS — D2372 Other benign neoplasm of skin of left lower limb, including hip: Secondary | ICD-10-CM | POA: Diagnosis not present

## 2022-08-19 DIAGNOSIS — M79676 Pain in unspecified toe(s): Secondary | ICD-10-CM | POA: Diagnosis not present

## 2022-08-19 DIAGNOSIS — B351 Tinea unguium: Secondary | ICD-10-CM | POA: Diagnosis not present

## 2022-08-19 NOTE — Progress Notes (Signed)
He presents today chief complaint of painful elongated toenails and calluses fourth toes bilateral.  Objective: Vital signs are stable alert oriented x 3.  Hammertoe deformities are noted bilateral small superficial lacerations between his toes most likely secondary to keeping the toes to moist interdigitally.  Otherwise his toenails are long thick yellow dystrophic and clinically mycotic.  Multiple reactive hyper keratomas plantar aspect of the bilateral foot.  Assessment: Pain in limb secondary to hammertoe deformities benign skin lesions and painful mycotic nails.  Plan: Debridement of benign skin lesions debridement of toenails 1 through 5 bilaterally I placed Castellani's pain on small areas of laceration.  Follow-up with Mark Gallagher in 5 weeks

## 2022-08-29 ENCOUNTER — Ambulatory Visit (INDEPENDENT_AMBULATORY_CARE_PROVIDER_SITE_OTHER): Payer: Medicare Other

## 2022-08-29 ENCOUNTER — Telehealth: Payer: Self-pay

## 2022-08-29 DIAGNOSIS — E349 Endocrine disorder, unspecified: Secondary | ICD-10-CM

## 2022-08-29 DIAGNOSIS — E291 Testicular hypofunction: Secondary | ICD-10-CM | POA: Diagnosis not present

## 2022-08-29 MED ORDER — TESTOSTERONE CYPIONATE 200 MG/ML IM SOLN
200.0000 mg | INTRAMUSCULAR | Status: DC
Start: 2022-08-29 — End: 2023-03-05

## 2022-08-29 NOTE — Progress Notes (Signed)
Pt was given Testerone 1mL  Left upper outer Pt tolerated , no issues

## 2022-08-29 NOTE — Telephone Encounter (Signed)
Left vm

## 2022-09-12 ENCOUNTER — Telehealth: Payer: Self-pay | Admitting: Family Medicine

## 2022-09-12 ENCOUNTER — Ambulatory Visit (INDEPENDENT_AMBULATORY_CARE_PROVIDER_SITE_OTHER): Payer: Medicare Other

## 2022-09-12 DIAGNOSIS — E291 Testicular hypofunction: Secondary | ICD-10-CM

## 2022-09-12 MED ORDER — TESTOSTERONE CYPIONATE 200 MG/ML IM SOLN
200.0000 mg | INTRAMUSCULAR | Status: DC
Start: 2022-09-12 — End: 2023-03-05
  Administered 2022-09-12 – 2023-01-23 (×8): 200 mg via INTRAMUSCULAR
  Administered 2023-02-27: 100 mg via INTRAMUSCULAR

## 2022-09-12 NOTE — Telephone Encounter (Signed)
I spoke to Dr. Neva Seat regarding this patient's testosterone injections and follow ups. He has not seen Dr. Neva Seat for a regular follow up and/or physical in over a year and his testosterone level has not been checked in over a year. I confirmed with Dr. Neva Seat that he would like to/needed to see this patient before we can continue to administer his testosterone injections. Patient stated at his last nurse visit, per Archie Patten, that he sees endocrinology and was told by Dr. Neva Seat that as long as he was doing that he only needed to see him for acute visits.I did let the patient know that per Dr. Neva Seat he would at least like to see him once a year for a physical to evaluate his all around care since endocrinology specializes in one area. Patient understood and agreed to an appt. I have set him up an appt in September and he stated he also had an appt on 9/19 with endo. I have made Dr. Neva Seat aware.

## 2022-09-18 NOTE — Progress Notes (Signed)
Pt received testosterone injection tolerated well

## 2022-09-23 ENCOUNTER — Ambulatory Visit: Payer: Medicare Other

## 2022-09-24 DIAGNOSIS — M4712 Other spondylosis with myelopathy, cervical region: Secondary | ICD-10-CM | POA: Diagnosis not present

## 2022-09-24 DIAGNOSIS — Z981 Arthrodesis status: Secondary | ICD-10-CM | POA: Diagnosis not present

## 2022-09-24 DIAGNOSIS — M4802 Spinal stenosis, cervical region: Secondary | ICD-10-CM | POA: Diagnosis not present

## 2022-09-25 ENCOUNTER — Ambulatory Visit: Payer: Medicare Other | Admitting: Podiatry

## 2022-09-26 ENCOUNTER — Ambulatory Visit: Payer: Medicare Other

## 2022-09-29 ENCOUNTER — Ambulatory Visit: Payer: Medicare Other | Admitting: Family Medicine

## 2022-09-29 ENCOUNTER — Encounter: Payer: Self-pay | Admitting: Family Medicine

## 2022-09-29 ENCOUNTER — Ambulatory Visit: Payer: Medicare Other

## 2022-09-29 ENCOUNTER — Ambulatory Visit (INDEPENDENT_AMBULATORY_CARE_PROVIDER_SITE_OTHER): Payer: Medicare Other | Admitting: Family Medicine

## 2022-09-29 VITALS — BP 118/70 | HR 71 | Temp 97.8°F | Ht 78.0 in | Wt 249.2 lb

## 2022-09-29 DIAGNOSIS — M7912 Myalgia of auxiliary muscles, head and neck: Secondary | ICD-10-CM

## 2022-09-29 DIAGNOSIS — E220 Acromegaly and pituitary gigantism: Secondary | ICD-10-CM | POA: Diagnosis not present

## 2022-09-29 DIAGNOSIS — M4712 Other spondylosis with myelopathy, cervical region: Secondary | ICD-10-CM

## 2022-09-29 DIAGNOSIS — E890 Postprocedural hypothyroidism: Secondary | ICD-10-CM

## 2022-09-29 DIAGNOSIS — E785 Hyperlipidemia, unspecified: Secondary | ICD-10-CM | POA: Diagnosis not present

## 2022-09-29 DIAGNOSIS — E291 Testicular hypofunction: Secondary | ICD-10-CM

## 2022-09-29 DIAGNOSIS — Z7901 Long term (current) use of anticoagulants: Secondary | ICD-10-CM | POA: Diagnosis not present

## 2022-09-29 DIAGNOSIS — Z952 Presence of prosthetic heart valve: Secondary | ICD-10-CM

## 2022-09-29 DIAGNOSIS — J309 Allergic rhinitis, unspecified: Secondary | ICD-10-CM

## 2022-09-29 DIAGNOSIS — E2740 Unspecified adrenocortical insufficiency: Secondary | ICD-10-CM

## 2022-09-29 NOTE — Progress Notes (Unsigned)
Subjective:  Patient ID: Mark Gallagher, male    DOB: [birth year]  Age: 52 y.o. MRN: [id number]  CC:  Chief Complaint  Patient presents with   Follow-up    Pt is here today to F/U Chronic mgnt. Pt reports no concerns Pt reports he is not FASTING    HPI Mark Gallagher presents for   Follow-up to review chronic conditions.  Last visit for acute issue of allergies in May. No new health concerns today.  Living by self for months. Doing well. Here with girlfriend today.   Allergic rhinitis Flonase recommend in May.  Trigger avoidance.  Cardiac History of aortic valve replacement, thoracic aortic aneurysm resection and grafting with mechanical aortic valve by Dr. Kizzie Bane at [facility] in September 2020.  He is on Coumadin for anticoagulation.  History of Wolff-Parkinson-White syndrome.  Local cardiologist Dr.Berry, appointment in February.  Annual exam with Dr. Kizzie Bane with MRI.  Hyperlipidemia treated with statin.  Cardiology note from March 5.  Typical atrial flutter, hypertension, cardiomyopathy, nonischemic and dyslipidemia.  Reportedly EF was measured by cardiac MRI in January 2023, just below normal.  Euvolemic, continued on eplerenone, beta-blocker, option of addition of ACE inhibitor if trending systolic blood pressure.  Continued on atorvastatin 40 mg for hyperlipidemia.  No myalgias/side effects.  Coumadin for valve and metoprolol as well. No new bleeding or med side effects. INR monitoring at cardiology - appt tomorrow.  Repeat MRI and visit with Dr. Kizzie Bane in January.   Endocrine History of acromegaly, chronic adrenal insufficiency, hypothyroidism, hypogonadism treated with testosterone supplementation.  We have given that here, but he is followed by specialist through [facility].  [facility].  Last visit noted from 11/25/2021.  Treated with pegvisomant 20mg  every day, recommend annual MRI while on this medication but was deferred at that visit due to financial  cost and changing coverage. Igf levels have improved.  Continued on testosterone 100 mg every 14 days.  Levothyroxine 200 mcg status post postoperative hypothyroidism. 10 mg hydrocortisone in the morning and evening for adrenal insufficiency with sick day rules discussed. Last testosterone level 165 on 06/25/2021,Normal free T4 of 0.88 at that time.  A1c of 5.5, borderline low ALT of 12, bilirubin of 1.6 but otherwise CMP was normal. Will be seeing endocrinologist in 2 weeks. Was supposed to see in May and had to reschedule.  Will see neurosurgeon next year - 5 year follow up planned.   Ortho History of cervical spine stenosis/spondylosis followed by [facility], Dr. Senaida Ores.  Appointment September 4, Neck x-rays  stable, neurologic was unchanged.  1 year follow-up planned per that visit. No pain. Uses 4 point cane for ambulation. Walking 9-10 miles per week.  Some soreness in front neck muscles, past few days. NKI.   Allergies doing better  - not needing flonase recently.   Defers flu vaccine to procedure visit in 2 weeks.  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   Hyperlipidemia 11/20/2020   Thoracic aortic aneurysm (HCC) 11/16/2018   Hiccups 10/17/2018   Thrombocytopenia (HCC) 10/14/2018   Dyslipidemia 10/11/2018   HTN (hypertension) 10/11/2018   Enlargement of aortic root (HCC) 04/06/2018   Chronic adrenal insufficiency (HCC) 03/26/2018   Hyponatremia 03/26/2018   Anasarca 03/26/2018   Hyperbilirubinemia 03/26/2018   Shock circulatory (HCC)    Primary localized osteoarthritis of hip 03/16/2018   S/P total hip arthroplasty 03/16/2018  Hypothyroidism 02/22/2018   History of fusion of cervical spine 02/22/2018   Iron deficiency anemia 02/22/2018   Acquired left foot drop 02/22/2018   Primary osteoarthritis of right hip 02/22/2018   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   Constipation  12/15/2016   Traumatic closed fracture of distal clavicle with minimal displacement, left, with routine healing, subsequent encounter 03/24/2016   Left knee pain 09/25/2015   Frequent PVCs 03/13/2015   Instability of left knee joint 10/20/2014   Adjustment disorder with depressed mood    Tetraplegia (HCC) 09/19/2014   Acute blood loss anemia 09/19/2014   Cervical spondylosis with myelopathy 09/18/2014   Typical atrial flutter (HCC) 09/14/2014   Radiculopathy of arm 08/28/2014   Hypogonadism male 09/08/2013   Testosterone deficiency 01/29/2012   Acromegaly (HCC) 04/01/2011   Cardiomyopathy 04/01/2011   Wolff-Parkinson-White (WPW) syndrome 04/01/2011   Past Medical History:  Diagnosis Date   Acromegaly (HCC)    Allergy    ? 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   moderate atypia - left post knee - widershave (free)   Atypical nevus 08/17/2014   moderate atypia - right mid back    Atypical nevus 12/20/2015   moderate to severe atypia - upper mid back - widershave   Atypical nevus 09/18/2017   mild atypia - left anterior back   Atypical nevus 09/18/2017   moderate atypia - left posterior back   Atypical nevus 07/16/2018   marked atypical - right neck, lower   Blood transfusion without reported diagnosis    Cancer (HCC)    atypical mole - no cancer per pt    History of pituitary tumor    Hypertension    pt denies - on metoprol for heart    Hypothyroidism    Knee pain, bilateral    Pneumonia    Testosterone deficiency    Thyroid disease    Past Surgical History:  Procedure Laterality Date   CARDIAC ELECTROPHYSIOLOGY STUDY AND ABLATION     CERVICAL DISC SURGERY  2007   Anterior C4/5 vertebrectomy and c3-C6 laminoplasty with plating   CRANIOTOMY FOR TUMOR     pituitary adenoma   SPINAL FUSION     2007, 2016 c3- t 2    THORACIC AORTIC ANEURYSM REPAIR     with Aortic valve replacement    TOTAL HIP ARTHROPLASTY  Right 03/16/2018   Procedure: TOTAL HIP ARTHROPLASTY Anterior;  Surgeon: Sheral Apley, MD;  Location: [facility];  Service: Orthopedics;  Laterality: Right;   Allergies  Allergen Reactions   Oxycodone Nausea Only   Quinolones     Other reaction(s): Other (See Comments) Fluroquinolone antibiotics should be avoided in patients with history of aortic aneurysm/dissection ([email or web address])   Prior to Admission medications   Medication Sig Start Date End Date Taking? Authorizing Provider  aluminum chloride (DRYSOL) 20 % external solution Apply topically at bedtime. 08/23/20  Yes Sheffield, Kelli R, PA-C  atorvastatin (LIPITOR) 40 MG tablet Take 40 mg by mouth daily.   Yes [provider]  Cholecalciferol (VITAMIN D3) 25 MCG (1000 UT) CAPS Take 2,000 Units by mouth daily.    Yes [provider]  eplerenone (INSPRA) 25 MG tablet Take 25 mg by mouth daily. 07/09/20  Yes [provider]  ferrous sulfate 325 (65 FE) MG tablet Take 1 tablet (325 mg total) by mouth 2 (two) times daily with a meal. 10/05/14  Yes Love,  Evlyn Kanner, PA-C  fluticasone (FLONASE) 50 MCG/ACT nasal spray Place 1-2 sprays into both nostrils daily. 05/30/22  Yes Shade Flood, MD  hydrocortisone (CORTEF) 10 MG tablet Take 1 tablet daily.  You may take 2 tablets daily with excessive weakness and tiredness and lethargy. Patient taking differently: Take 10 mg by mouth daily. 03/23/18  Yes Alwyn Ren, MD  levothyroxine (SYNTHROID, LEVOTHROID) 200 MCG tablet Take 1 tablet (200 mcg total) by mouth daily. 10/05/14  Yes Love, Evlyn Kanner, PA-C  methylPREDNISolone (MEDROL DOSEPAK) 4 MG TBPK tablet As directed 01/21/22  Yes Worthy Rancher B, FNP  metoprolol succinate (TOPROL-XL) 25 MG 24 hr tablet Take 25 mg by mouth daily. 06/13/19  Yes [provider]  Multiple Vitamins-Minerals (CENTRUM SILVER PO) Take 1 tablet by mouth daily.   Yes [provider]  polyethylene  glycol (MIRALAX / GLYCOLAX) packet Take 17 g by mouth daily as needed for mild constipation.   Yes [provider]  promethazine (PHENERGAN) 25 MG tablet Take 1 tablet (25 mg total) by mouth every 6 (six) hours as needed for nausea or vomiting. 01/07/21  Yes Willow Ora, MD  promethazine-dextromethorphan (PROMETHAZINE-DM) 6.25-15 MG/5ML syrup Take 5 mLs by mouth 4 (four) times daily as needed. 11/29/20  Yes Sheliah Hatch, MD  SOMAVERT 20 MG SOLR Inject into the skin. 07/26/20  Yes [provider]  warfarin (COUMADIN) 5 MG tablet TAKE 3 TABLETS BY MOUTH ONCE DAILY EXCEPT FOR  2 TABLETS ON TUESDAYS AND SATURDAYS 07/17/22  Yes Runell Gess, MD  benzonatate (TESSALON) 100 MG capsule Take 1 capsule (100 mg total) by mouth 3 (three) times daily as needed for cough. Patient not taking: Reported on 09/29/2022 01/10/21   Shade Flood, MD   Social History   Socioeconomic History   Marital status: Single    Spouse name: Not on file   Number of children: Not on file   Years of education: Not on file   Highest education level: Not on file  Occupational History   Not on file  Tobacco Use   Smoking status: Never   Smokeless tobacco: Never  Vaping Use   Vaping status: Never Used  Substance and Sexual Activity   Alcohol use: No   Drug use: No   Sexual activity: Not on file  Other Topics Concern   Not on file  Social History Narrative   Not on file   Social Determinants of Health   Financial Resource Strain: Low Risk  (06/24/2021)   Received [facility], [facility]   Overall Financial Resource Strain (CARDIA)    Difficulty of Paying Living Expenses: Not very hard  Food Insecurity: No Food Insecurity (06/24/2021)   Received from [facility], [facility]   Hunger Vital Sign    Worried About Running Out of Food in the Last Year: Never true    Ran Out of Food in the Last Year: Never true   Transportation Needs: No Transportation Needs (06/24/2021)   Received from [facility], [facility]   PRAPARE - Transportation    Lack of Transportation (Medical): No    Lack of Transportation (Non-Medical): No  Physical Activity: Sufficiently Active (12/22/2020)   Received from [facility], [facility]   Exercise Vital Sign    Days of Exercise per Week: 6 days    Minutes of Exercise per Session: 50 min  Stress:  No Stress Concern Present (12/22/2020)   Received from [facility], [facility]   [employer] of Occupational Health - Occupational Stress Questionnaire    Feeling of Stress : Not at all  Social Connections: Not on file  Intimate Partner Violence: Not on file    Review of Systems Per HPI.   Objective:   Vitals:   09/29/22 0942  BP: 118/70  Pulse: 71  Temp: 97.8 F (36.6 C)  SpO2: 99%  Weight: 249 lb 4 oz (113.1 kg)  Height: 6\' 6"  (1.981 m)     Physical Exam Vitals reviewed.  Constitutional:      Appearance: He is well-developed.  HENT:     Head: Normocephalic and atraumatic.  Neck:     Vascular: No carotid bruit or JVD.     Comments: Slight discomfort over the mid SCM on left and right, left greater than right.  Somewhat limited range of motion, no soft tissue swelling or erythema. Cardiovascular:     Rate and Rhythm: Normal rate and regular rhythm.     Heart sounds: Murmur (S2 click, intermittent ectopy.  Regular rate.) heard.  Pulmonary:     Effort: Pulmonary effort is normal.     Breath sounds: Normal breath sounds. No rales.  Musculoskeletal:     Right lower leg: No edema.     Left lower leg: No edema.  Skin:    General: Skin is warm and dry.  Neurological:     Mental Status: He is alert and oriented to person, place, and time.  Psychiatric:        Mood and Affect: Mood normal.     51 minutes spent during visit, including chart  review, specialist note and most recent lab review, counseling and assimilation of information, exam, discussion of plan, and chart completion.     Assessment & Plan:  Mark Gallagher is a 52 y.o. male . History of aortic valve replacement Chronic anticoagulation  -Tolerate anticoagulation, asymptomatic, continue routine follow-up with cardiology.   Hyperlipidemia, unspecified hyperlipidemia type  -Tolerating current dose statin.  Upcoming appointment with endocrinology.  Recommended specific lab work in patient instructions, including lipid panel, CMP.  Lab only visit if needed, not ordered through their visit.  Continue same dose statin for now.  Acromegaly (HCC)  -Ongoing follow-up with neurosurgery, endocrinology, continue same.  Hypogonadism male  -Tolerating testosterone, planned upcoming lab work with endocrinology, specific labs recommended on handout.  Lab only visit if needed.  Postoperative hypothyroidism  -Managed by endocrinology, continue follow-up.  Chronic adrenal insufficiency (HCC)  -As above, follow-up with endocrinology.  Allergic rhinitis, unspecified seasonality, unspecified trigger  -Continue Flonase, symptomatic care, RTC precautions.  Cervical spondylosis with myelopathy  -Overall stable, orthopedic follow-up as needed.  Sternocleidomastoid muscle tenderness  -Reproducible over SCM, gentle range of motion discussed, no known injury, hold on imaging for now.  No distal arm symptoms, RTC precautions.  No orders of the defined types were placed in this encounter.  Patient Instructions  Keep follow up with endocrinology in next 2 weeks. They will likely do blood work, but I recommend monitoring testosterone levels, PSA, blood counts, lipids, electrolytes with use of testosterone. I can order labs if needed separately - let me know. Keep follow up with endocrinology as planned with monitoring every 6 months, and routine follow up with your other specialists as  planned.  No med changes from me at this time.  Follow-up if the soreness on the neck muscles  is not improving in the next week or 2, sooner if worse.  Gentle range of motion is okay for now. Recheck in 6 months for a physical. Happy to see you sooner if needed. Take care!      Signed,   Meredith Staggers, MD [facility], [facility] 09/29/22 10:35 AM

## 2022-09-29 NOTE — Patient Instructions (Addendum)
Keep follow up with endocrinology in next 2 weeks. They will likely do blood work, but I recommend monitoring testosterone levels, PSA, blood counts, lipids, electrolytes with use of testosterone. I can order labs if needed separately - let me know. Keep follow up with endocrinology as planned with monitoring every 6 months, and routine follow up with your other specialists as planned.  No med changes from me at this time.  Follow-up if the soreness on the neck muscles is not improving in the next week or 2, sooner if worse.  Gentle range of motion is okay for now. Recheck in 6 months for a physical. Happy to see you sooner if needed. Take care!

## 2022-09-30 ENCOUNTER — Encounter: Payer: Self-pay | Admitting: Family Medicine

## 2022-09-30 ENCOUNTER — Ambulatory Visit: Payer: Medicare Other | Attending: Cardiology

## 2022-09-30 DIAGNOSIS — Z5181 Encounter for therapeutic drug level monitoring: Secondary | ICD-10-CM | POA: Diagnosis not present

## 2022-09-30 LAB — POCT INR: INR: 2 (ref 2.0–3.0)

## 2022-09-30 NOTE — Patient Instructions (Signed)
continue taking Warfarin 3 tablets daily except for 2 tablets on Tuesdays and Saturdays. Recheck INR 6 weeks.  [facility] [phone].

## 2022-10-07 ENCOUNTER — Ambulatory Visit: Payer: Medicare Other | Admitting: Podiatry

## 2022-10-07 DIAGNOSIS — D2372 Other benign neoplasm of skin of left lower limb, including hip: Secondary | ICD-10-CM

## 2022-10-07 DIAGNOSIS — D2371 Other benign neoplasm of skin of right lower limb, including hip: Secondary | ICD-10-CM | POA: Diagnosis not present

## 2022-10-07 DIAGNOSIS — M79676 Pain in unspecified toe(s): Secondary | ICD-10-CM

## 2022-10-07 DIAGNOSIS — B351 Tinea unguium: Secondary | ICD-10-CM

## 2022-10-08 NOTE — Progress Notes (Signed)
He presents today chief complaint of painful elongated toenails.  Objective: Toenails are long thick yellow dystrophic clinically mycotic painful palpation as well as debridement.  Assessment: Pain in limb secondary to onychomycosis.  Plan: Debridement of toenails 1 through 5 bilateral.

## 2022-10-09 DIAGNOSIS — Z79890 Hormone replacement therapy (postmenopausal): Secondary | ICD-10-CM | POA: Diagnosis not present

## 2022-10-09 DIAGNOSIS — E2740 Unspecified adrenocortical insufficiency: Secondary | ICD-10-CM | POA: Diagnosis not present

## 2022-10-09 DIAGNOSIS — E349 Endocrine disorder, unspecified: Secondary | ICD-10-CM | POA: Diagnosis not present

## 2022-10-09 DIAGNOSIS — Z79899 Other long term (current) drug therapy: Secondary | ICD-10-CM | POA: Diagnosis not present

## 2022-10-09 DIAGNOSIS — E890 Postprocedural hypothyroidism: Secondary | ICD-10-CM | POA: Diagnosis not present

## 2022-10-09 DIAGNOSIS — E291 Testicular hypofunction: Secondary | ICD-10-CM | POA: Diagnosis not present

## 2022-10-09 DIAGNOSIS — E220 Acromegaly and pituitary gigantism: Secondary | ICD-10-CM | POA: Diagnosis not present

## 2022-10-09 DIAGNOSIS — M8580 Other specified disorders of bone density and structure, unspecified site: Secondary | ICD-10-CM | POA: Diagnosis not present

## 2022-10-13 ENCOUNTER — Ambulatory Visit (INDEPENDENT_AMBULATORY_CARE_PROVIDER_SITE_OTHER): Payer: Medicare Other

## 2022-10-13 DIAGNOSIS — Z23 Encounter for immunization: Secondary | ICD-10-CM

## 2022-10-13 NOTE — Progress Notes (Signed)
Mark Gallagher is a 52 y.o. male presents to the office today for Testosterone and  Influenza injections, per physician's orders. Original order: 10/13/22 Influenza (med),  IM was administered Lt Deltoid  (location) today. Patient tolerated injection. [name]

## 2022-10-27 ENCOUNTER — Ambulatory Visit: Payer: Medicare Other

## 2022-10-29 ENCOUNTER — Ambulatory Visit (INDEPENDENT_AMBULATORY_CARE_PROVIDER_SITE_OTHER): Payer: Medicare Other

## 2022-10-29 DIAGNOSIS — E291 Testicular hypofunction: Secondary | ICD-10-CM

## 2022-10-29 NOTE — Progress Notes (Addendum)
Mark Gallagher is a 52 y.o. male presents to the office today for Testosterone injections, per physician's orders.  Testosterone Cypionate 200 mg/ 0.5 mL administered IM to the Left upper outer quadrant of the gluteal muscle today. Patient tolerated injection. Patient due for follow up labs/provider appt: No Patient next injection due: 11/12/2022, appt made Yes  Mark Gallagher

## 2022-11-05 DIAGNOSIS — L821 Other seborrheic keratosis: Secondary | ICD-10-CM | POA: Diagnosis not present

## 2022-11-05 DIAGNOSIS — L814 Other melanin hyperpigmentation: Secondary | ICD-10-CM | POA: Diagnosis not present

## 2022-11-05 DIAGNOSIS — L578 Other skin changes due to chronic exposure to nonionizing radiation: Secondary | ICD-10-CM | POA: Diagnosis not present

## 2022-11-05 DIAGNOSIS — D229 Melanocytic nevi, unspecified: Secondary | ICD-10-CM | POA: Diagnosis not present

## 2022-11-11 ENCOUNTER — Ambulatory Visit: Payer: Medicare Other

## 2022-11-12 ENCOUNTER — Ambulatory Visit (INDEPENDENT_AMBULATORY_CARE_PROVIDER_SITE_OTHER): Payer: Medicare Other

## 2022-11-12 DIAGNOSIS — E291 Testicular hypofunction: Secondary | ICD-10-CM

## 2022-11-12 NOTE — Progress Notes (Signed)
Mark Gallagher is a 52 y.o. male presents to the office today for Testosterone cypionate injections, per physician's orders.  Testosterone Cypionate (med), 0.26ml (dose),  IM (route) was administered Rt upper outer quadrant (location) today. Patient tolerated injection. Patient due for follow up labs/provider appt: No.   [name]

## 2022-11-13 ENCOUNTER — Ambulatory Visit: Payer: Medicare Other | Attending: Cardiology

## 2022-11-13 DIAGNOSIS — Z5181 Encounter for therapeutic drug level monitoring: Secondary | ICD-10-CM | POA: Diagnosis not present

## 2022-11-13 LAB — POCT INR: INR: 2 (ref 2.0–3.0)

## 2022-11-13 NOTE — Patient Instructions (Signed)
Description   Continue taking Warfarin 3 tablets daily except for 2 tablets on Tuesdays and Saturdays. Recheck INR 6 weeks. [facility]madin Clinic [phone].       

## 2022-11-20 ENCOUNTER — Ambulatory Visit: Payer: Medicare Other | Admitting: Podiatry

## 2022-11-26 ENCOUNTER — Ambulatory Visit: Payer: Medicare Other

## 2022-11-26 DIAGNOSIS — E291 Testicular hypofunction: Secondary | ICD-10-CM

## 2022-11-26 NOTE — Progress Notes (Signed)
Mark Gallagher is a 52 y.o. male presents to the office today for Testosterone injection per physician's orders. Injection was administered Intramuscular Left upper quad. gluteus.   Patient's next injection due 12/10/2022, appt made? yes  [name]

## 2022-12-09 ENCOUNTER — Ambulatory Visit: Payer: Medicare Other | Admitting: Podiatry

## 2022-12-09 NOTE — Progress Notes (Signed)
Note reviewed, and agree with documentation and plan. I was available by phone or text if needed and available for questions or concerns regarding visit or follow-up issues to be addressed.  Signed,   Meredith Staggers, MD [facility], [facility] 12/09/22 11:16 AM

## 2022-12-10 ENCOUNTER — Ambulatory Visit (INDEPENDENT_AMBULATORY_CARE_PROVIDER_SITE_OTHER): Payer: Medicare Other

## 2022-12-10 DIAGNOSIS — E291 Testicular hypofunction: Secondary | ICD-10-CM

## 2022-12-10 NOTE — Progress Notes (Signed)
Mark Gallagher is a 52 y.o. male presents to the office today for Testosterone Cypionate injection per physician's orders. Injection was administered Intramuscular Right upper quad. gluteus.   Patient's next injection due 12/24/2022, appt made? yes  [name]

## 2022-12-23 ENCOUNTER — Ambulatory Visit: Payer: Medicare Other

## 2022-12-23 ENCOUNTER — Ambulatory Visit: Payer: Medicare Other | Attending: Cardiology

## 2022-12-23 DIAGNOSIS — Z5181 Encounter for therapeutic drug level monitoring: Secondary | ICD-10-CM | POA: Diagnosis not present

## 2022-12-23 LAB — POCT INR: INR: 1.9 — AB (ref 2.0–3.0)

## 2022-12-23 NOTE — Patient Instructions (Signed)
Continue taking Warfarin 3 tablets daily except for 2 tablets on Tuesdays and Saturdays. Recheck INR 6 weeks.  Coumadin Clinic [phone].

## 2022-12-24 ENCOUNTER — Ambulatory Visit: Payer: Medicare Other | Admitting: Family Medicine

## 2022-12-24 DIAGNOSIS — E291 Testicular hypofunction: Secondary | ICD-10-CM

## 2022-12-24 NOTE — Progress Notes (Signed)
Mark Gallagher is a 52 y.o. male presents to the office today for testosterone per physician's orders. Injection was administered  Right upper quad. gluteus.   Patient's next injection due in 14 days, appt made? Yes  [name]

## 2022-12-30 ENCOUNTER — Ambulatory Visit: Payer: Medicare Other | Admitting: Podiatry

## 2023-01-01 ENCOUNTER — Ambulatory Visit: Payer: Medicare Other | Admitting: Podiatry

## 2023-01-01 DIAGNOSIS — B351 Tinea unguium: Secondary | ICD-10-CM

## 2023-01-01 DIAGNOSIS — M79676 Pain in unspecified toe(s): Secondary | ICD-10-CM

## 2023-01-01 NOTE — Progress Notes (Signed)
  Subjective:  Patient ID: Mark Gallagher, male    DOB: 28-Apr-1970,  MRN: [id number]  No chief complaint on file.   52 y.o. male presents with the above complaint. History confirmed with patient.  He is here today for debridement and treatment of thickened elongated mycotic nails, he normally sees Dr. Al Corpus for this on a regular basis  Objective:  Physical Exam: warm, good capillary refill, no trophic changes or ulcerative lesions, normal DP and PT pulses, normal sensory exam, and thickened elongated dystrophic nails with subungual debris and yellow and white discoloration x 10.  Assessment:   1. Pain due to onychomycosis of toenail      Plan:  Patient was evaluated and treated and all questions answered.  Discussed the etiology and treatment options for the condition in detail with the patient. . Recommended debridement of the nails today. Sharp and mechanical debridement performed of all painful and mycotic nails today. Nails debrided in length and thickness using a nail nipper and mechanical bur to level of comfort. Discussed treatment options including appropriate shoe gear. Follow up as needed for painful nails.   Return in about 6 weeks (around 02/12/2023) for painful thick fungal nails.

## 2023-01-07 ENCOUNTER — Ambulatory Visit (INDEPENDENT_AMBULATORY_CARE_PROVIDER_SITE_OTHER): Payer: Medicare Other

## 2023-01-07 DIAGNOSIS — E291 Testicular hypofunction: Secondary | ICD-10-CM

## 2023-01-07 NOTE — Progress Notes (Signed)
Mark Gallagher is a 52 y.o. male presents to the office today for Testosterone Cypionate injection per physician's orders. Injection was administered Oral Left upper quad. gluteus.   Patient's next injection due 2 weeks , appt made? yes  [name]

## 2023-01-12 ENCOUNTER — Other Ambulatory Visit: Payer: Self-pay

## 2023-01-12 DIAGNOSIS — I483 Typical atrial flutter: Secondary | ICD-10-CM

## 2023-01-12 DIAGNOSIS — Z5181 Encounter for therapeutic drug level monitoring: Secondary | ICD-10-CM

## 2023-01-12 MED ORDER — WARFARIN SODIUM 5 MG PO TABS
ORAL_TABLET | ORAL | 3 refills | Status: DC
Start: 2023-01-12 — End: 2023-04-08

## 2023-01-22 ENCOUNTER — Ambulatory Visit: Payer: Medicare Other

## 2023-01-23 ENCOUNTER — Ambulatory Visit: Payer: Medicare Other

## 2023-01-23 DIAGNOSIS — E291 Testicular hypofunction: Secondary | ICD-10-CM | POA: Diagnosis not present

## 2023-01-23 NOTE — Progress Notes (Addendum)
 Mark Gallagher is a 53 y.o. male presents to the office today for Testosterone  Cypionate injection per physician's orders. Injection was administered Intramuscular Right upper quad. gluteus.   Patient's next injection due 2 weeks , appt made? yes  [name]

## 2023-01-27 NOTE — Telephone Encounter (Signed)
 Copied from CRM [id number]. Topic: General - Call Back - No Documentation >> Jan 26, 2023  4:59 PM Suzette B wrote: Reason for CRM: patient was returning call that he missed from clinic a little before 4pm cst. Patient request that someone please give him a call back

## 2023-02-03 ENCOUNTER — Ambulatory Visit: Payer: Medicare Other | Attending: Cardiology | Admitting: *Deleted

## 2023-02-03 DIAGNOSIS — Z5181 Encounter for therapeutic drug level monitoring: Secondary | ICD-10-CM

## 2023-02-03 LAB — POCT INR: INR: 2 (ref 2.0–3.0)

## 2023-02-03 NOTE — Patient Instructions (Signed)
Description   Continue taking Warfarin 3 tablets daily except for 2 tablets on Tuesdays and Saturdays. Recheck INR 6 weeks. [facility]madin Clinic [phone].       

## 2023-02-04 ENCOUNTER — Encounter: Payer: Self-pay | Admitting: Family Medicine

## 2023-02-04 ENCOUNTER — Ambulatory Visit: Payer: Medicare Other | Admitting: Family Medicine

## 2023-02-04 VITALS — BP 112/60 | HR 61 | Temp 98.3°F | Ht 78.0 in | Wt 252.6 lb

## 2023-02-04 DIAGNOSIS — E291 Testicular hypofunction: Secondary | ICD-10-CM | POA: Diagnosis not present

## 2023-02-04 DIAGNOSIS — E785 Hyperlipidemia, unspecified: Secondary | ICD-10-CM

## 2023-02-04 DIAGNOSIS — Z5181 Encounter for therapeutic drug level monitoring: Secondary | ICD-10-CM

## 2023-02-04 DIAGNOSIS — Z23 Encounter for immunization: Secondary | ICD-10-CM

## 2023-02-04 DIAGNOSIS — Z130 Encounter for screening for diseases of the blood and blood-forming organs and certain disorders involving the immune mechanism: Secondary | ICD-10-CM | POA: Diagnosis not present

## 2023-02-04 DIAGNOSIS — Z0000 Encounter for general adult medical examination without abnormal findings: Secondary | ICD-10-CM | POA: Diagnosis not present

## 2023-02-04 DIAGNOSIS — Z131 Encounter for screening for diabetes mellitus: Secondary | ICD-10-CM

## 2023-02-04 DIAGNOSIS — Z125 Encounter for screening for malignant neoplasm of prostate: Secondary | ICD-10-CM

## 2023-02-04 DIAGNOSIS — Z1329 Encounter for screening for other suspected endocrine disorder: Secondary | ICD-10-CM

## 2023-02-04 LAB — PSA: PSA: 0.22 ng/mL (ref 0.10–4.00)

## 2023-02-04 NOTE — Patient Instructions (Addendum)
 Pneumonia vaccine at nurse visit next week.  No med changes for now.  I will check PSA level, keep follow up with other specialists for ongoing care and other monitoring labs.   Thanks for coming in today and take care.  If any new symptoms or concerns, please schedule appointment.   Preventive Care 98-54 Years Old, Male Preventive care refers to lifestyle choices and visits with your health care provider that can promote health and wellness. Preventive care visits are also called wellness exams. What can I expect for my preventive care visit? Counseling During your preventive care visit, your health care provider may ask about your: Medical history, including: Past medical problems. Family medical history. Current health, including: Emotional well-being. Home life and relationship well-being. Sexual activity. Lifestyle, including: Alcohol, nicotine or tobacco, and drug use. Access to firearms. Diet, exercise, and sleep habits. Safety issues such as seatbelt and bike helmet use. Sunscreen use. Work and work Astronomer. Physical exam Your health care provider will check your: Height and weight. These may be used to calculate your BMI (body mass index). BMI is a measurement that tells if you are at a healthy weight. Waist circumference. This measures the distance around your waistline. This measurement also tells if you are at a healthy weight and may help predict your risk of certain diseases, such as type 2 diabetes and high blood pressure. Heart rate and blood pressure. Body temperature. Skin for abnormal spots. What immunizations do I need?  Vaccines are usually given at various ages, according to a schedule. Your health care provider will recommend vaccines for you based on your age, medical history, and lifestyle or other factors, such as travel or where you work. What tests do I need? Screening Your health care provider may recommend screening tests for certain conditions.  This may include: Lipid and cholesterol levels. Diabetes screening. This is done by checking your blood sugar (glucose) after you have not eaten for a while (fasting). Hepatitis B test. Hepatitis C test. HIV (human immunodeficiency virus) test. STI (sexually transmitted infection) testing, if you are at risk. Lung cancer screening. Prostate cancer screening. Colorectal cancer screening. Talk with your health care provider about your test results, treatment options, and if necessary, the need for more tests. Follow these instructions at home: Eating and drinking  Eat a diet that includes fresh fruits and vegetables, whole grains, lean protein, and low-fat dairy products. Take vitamin and mineral supplements as recommended by your health care provider. Do not drink alcohol if your health care provider tells you not to drink. If you drink alcohol: Limit how much you have to 0-2 drinks a day. Know how much alcohol is in your drink. In the [place] one drink equals one 12 oz bottle of beer (355 mL), one 5 oz glass of wine (148 mL), or one 1 oz glass of hard liquor (44 mL). Lifestyle Brush your teeth every morning and night with fluoride toothpaste. Floss one time each day. Exercise for at least 30 minutes 5 or more days each week. Do not use any products that contain nicotine or tobacco. These products include cigarettes, chewing tobacco, and vaping devices, such as e-cigarettes. If you need help quitting, ask your health care provider. Do not use drugs. If you are sexually active, practice safe sex. Use a condom or other form of protection to prevent STIs. Take aspirin  only as told by your health care provider. Make sure that you understand how much to take and what form to take.  Work with your health care provider to find out whether it is safe and beneficial for you to take aspirin  daily. Find healthy ways to manage stress, such as: Meditation, yoga, or listening to  music. Journaling. Talking to a trusted person. Spending time with friends and family. Minimize exposure to UV radiation to reduce your risk of skin cancer. Safety Always wear your seat belt while driving or riding in a vehicle. Do not drive: If you have been drinking alcohol. Do not ride with someone who has been drinking. When you are tired or distracted. While texting. If you have been using any mind-altering substances or drugs. Wear a helmet and other protective equipment during sports activities. If you have firearms in your house, make sure you follow all gun safety procedures. What's next? Go to your health care provider once a year for an annual wellness visit. Ask your health care provider how often you should have your eyes and teeth checked. Stay up to date on all vaccines. This information is not intended to replace advice given to you by your health care provider. Make sure you discuss any questions you have with your health care provider. Document Revised: 07/04/2020 Document Reviewed: 07/04/2020 [employer] Patient Education  2024 [employer].

## 2023-02-04 NOTE — Progress Notes (Signed)
 Subjective:  Patient ID: Mark Gallagher, male    DOB: Jul 23, 1970  Age: 53 y.o. MRN: [id number]  CC:  Chief Complaint  Patient presents with   Annual Exam    Pt wants to be examined for enlarged prostate, notes no changes in urinary sxs, pt is doing well otherwise     HPI Mark Gallagher presents for Annual Exam  Also with concern for possible enlarged prostate as above. No health changes.   PCP, me  Cardiac:  history of aortic valve replacement, thoracic aortic aneurysm resection and grafting with mechanical aortic valve by Dr. Frosty Jews at [facility] in September 2020.  On Coumadin  for anticoagulation, history of Wolff-Parkinson-White syndrome and local cardiologist Dr. Dean Every.  Annual exams with Dr. Frosty Jews with MRI.   Atrial flutter hypertension, cardiomyopathy, and dyslipidemia managed by cardiology.  Continued on eplerenone, beta-blocker and option of ACE inhibitor if trending systolic blood pressure when discussed in September.  Atorvastatin  40 mg daily for hyperlipidemia.  On metoprolol  and Coumadin  without new bleeding or medication side effects.  Followed by Coumadin  clinic for management of anticoagulation, appointment yesterday.  Appointment with Dr. Frosty Jews in 2 days.plan on MRI and 2 year check.   BP Readings from Last 3 Encounters:  02/04/23 112/60  09/29/22 118/70  05/30/22 110/78   Allergic rhinitis - controlled with Flonase .   Endocrine:  History of acromegaly, chronic adrenal insufficiency, hypothyroidism and hypogonadism treated with testosterone  supplementation and followed by endocrinologist through [facility].  [facility] [address] [facility], Thalia Filler, [place] appointment October 09, 2022.  Repeat IGF at that time and continued on pegvisomat 20mg .  Recommendations for MRI to monitor tumor, with plan for repeat MRI this year.  Plans to discuss with his neurosurgeon at appt this year with Dr. Reather Campbell this year.  Appt in April with endocrine - Dr .Blythe Winchester.   Testosterone  supplementation has been given here for ease of distance for treatment but is followed by endocrinology routinely with labs.   testosterone  100 mg q. 14 days, levothyroxine  200 mcg status post postoperative hypothyroidism.  10 mg hydrocortisone  in the morning and evening for adrenal insufficiency with 6-day rules discussed at endocrine. Outside labs reviewed from September 19.  Testosterone  268.  IGF normal 174.  A1c 5.3.  CMP normal except AST 13, BUN 21 but normal creatinine of 1.  Normal lipid panel.  Hemoglobin 14.1.  Borderline MCV of 101 but other CBC numbers look okay.  Ferritin 278.  No recent PSA. Lab Results  Component Value Date   PSA 0.17 01/16/2015   PSA CANCELED 12/07/2014   PSA 0.49 09/08/2013   Ortho: History of cervical spine stenosis and spondylosis, history of cervical spine fusion, followed by [facility], Dr. Wayna Hails.  4 point cane for ambulation.  Walking for exercise. Doing well.      05/30/2022   11:27 AM 01/21/2022   10:42 AM 11/21/2020   12:53 PM 09/02/2019   11:26 AM 10/21/2018    4:09 PM  Depression screen PHQ 2/9  Decreased Interest 0 0 0  0  Down, Depressed, Hopeless 0 0 0 0 0  PHQ - 2 Score 0 0 0 0 0  Altered sleeping 0 0 0    Tired, decreased energy 0 0 0    Change in appetite 1 0 0    Feeling bad or failure about yourself  0 0 0    Trouble concentrating 0 0 0    Moving slowly or fidgety/restless  0 0  Suicidal thoughts 0 0 0    PHQ-9 Score 1 0 0    Difficult doing work/chores Not difficult at all  Not difficult at all      Health Maintenance  Topic Date Due   Medicare Annual Wellness (AWV)  Never done   COVID-19 Vaccine (4 - 2024-25 season) 09/21/2022   Colonoscopy  01/09/2025   DTaP/Tdap/Td (2 - Td or Tdap) 10/31/2026   INFLUENZA VACCINE  Completed   Hepatitis C Screening  Completed   HIV Screening  Completed   Zoster Vaccines- Shingrix  Completed   HPV VACCINES  Aged Out  Colonoscopy 2021 - 5 year repeat.  Prostate: does not  have  family history of prostate cancer The natural history of prostate cancer and ongoing controversy regarding screening and potential treatment outcomes of prostate cancer has been discussed with the patient. The meaning of a false positive PSA and a false negative PSA has been discussed. He indicates understanding of the limitations of this screening test and wishes  to proceed with screening PSA testing, also with monitoring for testosterone  use as above. Denies new urinary symptoms. Some decreased erections but not sexually active, not planning until married with current partner.    Lab Results  Component Value Date   PSA 0.17 01/16/2015   PSA CANCELED 12/07/2014   PSA 0.49 09/08/2013   Immunization History  Administered Date(s) Administered   Influenza, Seasonal, Injecte, Preservative Fre 10/13/2022   Influenza,inj,Quad PF,6+ Mos 11/02/2014, 10/30/2016, 10/30/2017, 10/29/2018, 11/18/2019, 10/30/2020, 11/06/2021   Influenza-Unspecified 11/03/2016   PFIZER(Purple Top)SARS-COV-2 Vaccination 06/22/2019, 07/18/2019, 02/17/2020   Tdap 10/30/2016   Zoster Recombinant(Shingrix) 09/17/2020, 01/16/2021  Covid booster - declined.  Prevnar 20 recommended based on age. Plans on nurse visit next week.   No results found. Optho visit in July or August last year - looked good. No vision changes.   Dental: appt tomorrow  Alcohol:none  Tobacco: none  Exercise:walking  - 9 miles per week - 450 mi per year since March 2021.    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   Hyperlipidemia 11/20/2020   Thoracic aortic aneurysm (HCC) 11/16/2018   Hiccups 10/17/2018   Thrombocytopenia (HCC) 10/14/2018   Dyslipidemia 10/11/2018   HTN (hypertension) 10/11/2018   Enlargement of aortic root (HCC) 04/06/2018   Chronic adrenal insufficiency (HCC) 03/26/2018   Hyponatremia 03/26/2018   Anasarca 03/26/2018    Hyperbilirubinemia 03/26/2018   Shock circulatory (HCC)    Primary localized osteoarthritis of hip 03/16/2018   S/P total hip arthroplasty 03/16/2018   Hypothyroidism 02/22/2018   History of fusion of cervical spine 02/22/2018   Iron deficiency anemia 02/22/2018   Acquired left foot drop 02/22/2018   Primary osteoarthritis of right hip 02/22/2018   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   Constipation 12/15/2016   Traumatic closed fracture of distal clavicle with minimal displacement, left, with routine healing, subsequent encounter 03/24/2016   Left knee pain 09/25/2015   Frequent PVCs 03/13/2015   Instability of left knee joint 10/20/2014   Adjustment disorder with depressed mood    Tetraplegia (HCC) 09/19/2014   Acute blood loss anemia 09/19/2014   Cervical spondylosis with myelopathy 09/18/2014   Typical atrial flutter (HCC) 09/14/2014   Radiculopathy of arm 08/28/2014   Hypogonadism male 09/08/2013   Testosterone  deficiency 01/29/2012   Acromegaly (HCC) 04/01/2011   Cardiomyopathy (HCC) 04/01/2011   Wolff-Parkinson-White (WPW) syndrome 04/01/2011   Past  Medical History:  Diagnosis Date   Acromegaly [facility])    Allergy    ? 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   moderate atypia - left post knee - widershave (free)   Atypical nevus 08/17/2014   moderate atypia - right mid back    Atypical nevus 12/20/2015   moderate to severe atypia - upper mid back - widershave   Atypical nevus 09/18/2017   mild atypia - left anterior back   Atypical nevus 09/18/2017   moderate atypia - left posterior back   Atypical nevus 07/16/2018   marked atypical - right neck, lower   Blood transfusion without reported diagnosis    Cancer (HCC)    atypical mole - no cancer per pt    History of pituitary tumor    Hypertension    pt denies - on metoprol for heart    Hypothyroidism    Knee pain, bilateral    Pneumonia     Testosterone  deficiency    Thyroid  disease    Past Surgical History:  Procedure Laterality Date   CARDIAC ELECTROPHYSIOLOGY STUDY AND ABLATION     CERVICAL DISC SURGERY  2007   Anterior C4/5 vertebrectomy and c3-C6 laminoplasty with plating   CRANIOTOMY FOR TUMOR     pituitary adenoma   SPINAL FUSION     2007, 2016 c3- t 2    THORACIC AORTIC ANEURYSM REPAIR     with Aortic valve replacement    TOTAL HIP ARTHROPLASTY Right 03/16/2018   Procedure: TOTAL HIP ARTHROPLASTY Anterior;  Surgeon: Saundra Curl, MD;  Location: [facility] ORS;  Service: Orthopedics;  Laterality: Right;   Allergies  Allergen Reactions   Oxycodone  Nausea Only   Quinolones     Other reaction(s): Other (See Comments) Fluroquinolone antibiotics should be avoided in patients with history of aortic aneurysm/dissection ([email or web address])   Prior to Admission medications   Medication Sig Start Date End Date Taking? Authorizing Provider  atorvastatin  (LIPITOR) 40 MG tablet Take 40 mg by mouth daily.   Yes [provider]  Cholecalciferol (VITAMIN D3) 25 MCG (1000 UT) CAPS Take 2,000 Units by mouth daily.    Yes [provider]  eplerenone (INSPRA) 25 MG tablet Take 25 mg by mouth daily. 07/09/20  Yes [provider]  ferrous sulfate  325 (65 FE) MG tablet Take 1 tablet (325 mg total) by mouth 2 (two) times daily with a meal. 10/05/14  Yes Love, Renay Carota, PA-C  fluticasone  (FLONASE ) 50 MCG/ACT nasal spray Place 1-2 sprays into both nostrils daily. 05/30/22  Yes Benjiman Bras, MD  hydrocortisone  (CORTEF ) 10 MG tablet Take 1 tablet daily.  You may take 2 tablets daily with excessive weakness and tiredness and lethargy. Patient taking differently: Take 10 mg by mouth daily. 03/23/18  Yes Barbee Lew, MD  levothyroxine  (SYNTHROID , LEVOTHROID) 200 MCG tablet Take 1 tablet (200 mcg total) by mouth daily. 10/05/14  Yes Love, Renay Carota, PA-C  metoprolol  succinate  (TOPROL -XL) 25 MG 24 hr tablet Take 25 mg by mouth daily. 06/13/19  Yes [provider]  Multiple Vitamins-Minerals (CENTRUM SILVER PO) Take 1 tablet by mouth daily.   Yes [provider]  SOMAVERT 20 MG SOLR Inject into the skin. 07/26/20  Yes [provider]  warfarin (COUMADIN ) 5 MG tablet TAKE 3 TABLETS BY MOUTH ONCE DAILY EXCEPT FOR  2 TABLETS ON TUESDAYS AND SATURDAYS 01/12/23  Yes Lauro Portal  J, MD   Social History   Socioeconomic History   Marital status: Single    Spouse name: Not on file   Number of children: Not on file   Years of education: Not on file   Highest education level: Not on file  Occupational History   Not on file  Tobacco Use   Smoking status: Never   Smokeless tobacco: Never  Vaping Use   Vaping status: Never Used  Substance and Sexual Activity   Alcohol use: No   Drug use: No   Sexual activity: Not Currently  Other Topics Concern   Not on file  Social History Narrative   Not on file   Social Drivers of Health   Financial Resource Strain: Low Risk  (10/09/2022)   Received from [facility]   Overall Financial Resource Strain (CARDIA)    Difficulty of Paying Living Expenses: Not hard at all  Food Insecurity: No Food Insecurity (10/09/2022)   Received from [facility]   Hunger Vital Sign    Worried About Running Out of Food in the Last Year: Never true    Ran Out of Food in the Last Year: Never true  Transportation Needs: No Transportation Needs (10/09/2022)   [facility] - Transportation    In the past 12 months, has lack of transportation kept you from medical appointments or from getting medications?: No    Lack of Transportation (Non-Medical): No  Physical Activity: Sufficiently Active (12/22/2020)   Received from [facility], [facility]   Exercise Vital Sign    Days of Exercise per Week: 6 days     Minutes of Exercise per Session: 50 min  Stress: No Stress Concern Present (12/22/2020)   Received from [facility], [facility]   [employer] of Occupational Health - Occupational Stress Questionnaire    Feeling of Stress : Not at all  Social Connections: Not on file  Intimate Partner Violence: Not on file    Review of Systems  13 point review of systems per patient health survey noted.  Negative other than as indicated above or in HPI.   Objective:   Vitals:   02/04/23 1042  BP: 112/60  Pulse: 61  Temp: 98.3 F (36.8 C)  TempSrc: Temporal  SpO2: 99%  Weight: 252 lb 9.6 oz (114.6 kg)  Height: 6\' 6"  (1.981 m)     Physical Exam Vitals reviewed.  Constitutional:      Appearance: Normal appearance. He is well-developed. He is not ill-appearing.  HENT:     Head: Normocephalic and atraumatic.     Right Ear: External ear normal.     Left Ear: External ear normal.  Eyes:     Conjunctiva/sclera: Conjunctivae normal.     Pupils: Pupils are equal, round, and reactive to light.  Neck:     Thyroid : No thyromegaly.  Cardiovascular:     Rate and Rhythm: Normal rate and regular rhythm.     Heart sounds: Normal heart sounds.  Pulmonary:     Effort: Pulmonary effort is normal. No respiratory distress.     Breath sounds: Normal breath sounds. No wheezing.  Abdominal:     General: There is no distension.     Palpations: Abdomen is soft.     Tenderness: There is no abdominal tenderness.  Musculoskeletal:        General: No tenderness. Normal range of motion.  Cervical back: Normal range of motion and neck supple.  Lymphadenopathy:     Cervical: No cervical adenopathy.  Skin:    General: Skin is warm and dry.  Neurological:     Mental Status: He is alert and oriented to person, place, and time.     Deep Tendon Reflexes: Reflexes are normal and symmetric.  Psychiatric:        Behavior: Behavior normal.        Assessment &  Plan:  Mark Gallagher is a 53 y.o. male . Annual physical exam - Plan: PSA  - -anticipatory guidance as below in AVS, screening labs above. Health maintenance items as above in HPI discussed/recommended as applicable.   Hypogonadism male  -Monitored by endocrinology.  Receives injections here.  Recent labs noted by endocrinology, but added PSA.  4-month follow-ups discussed for ongoing monitoring as long as he is receiving his injections at our office.  Continue follow-up with endocrinology as planned.  Hyperlipidemia, unspecified hyperlipidemia type  -Tolerating statin, continue same.  Screening for deficiency anemia Screening for prostate cancer - Plan: PSA Screening for thyroid  disorder Screening for diabetes mellitus Medication monitoring encounter - Plan: PSA   -As above, followed by endocrinology with monitoring labs noted and plans on repeat office visit next few months.  Adjust med dosing accordingly, PSA obtained, reassuring.  RTC precautions if any urinary symptoms, denies any symptoms at this time.  Need for pneumococcal vaccination - Plan: Pneumococcal conjugate vaccine 20-valent (Prevnar 20)   No orders of the defined types were placed in this encounter.  Patient Instructions  Pneumonia vaccine at nurse visit next week.  No med changes for now.  I will check PSA level, keep follow up with other specialists for ongoing care and other monitoring labs.   Thanks for coming in today and take care.  If any new symptoms or concerns, please schedule appointment.   Preventive Care 64-72 Years Old, Male Preventive care refers to lifestyle choices and visits with your health care provider that can promote health and wellness. Preventive care visits are also called wellness exams. What can I expect for my preventive care visit? Counseling During your preventive care visit, your health care provider may ask about your: Medical history, including: Past medical problems. Family  medical history. Current health, including: Emotional well-being. Home life and relationship well-being. Sexual activity. Lifestyle, including: Alcohol, nicotine or tobacco, and drug use. Access to firearms. Diet, exercise, and sleep habits. Safety issues such as seatbelt and bike helmet use. Sunscreen use. Work and work Astronomer. Physical exam Your health care provider will check your: Height and weight. These may be used to calculate your BMI (body mass index). BMI is a measurement that tells if you are at a healthy weight. Waist circumference. This measures the distance around your waistline. This measurement also tells if you are at a healthy weight and may help predict your risk of certain diseases, such as type 2 diabetes and high blood pressure. Heart rate and blood pressure. Body temperature. Skin for abnormal spots. What immunizations do I need?  Vaccines are usually given at various ages, according to a schedule. Your health care provider will recommend vaccines for you based on your age, medical history, and lifestyle or other factors, such as travel or where you work. What tests do I need? Screening Your health care provider may recommend screening tests for certain conditions. This may include: Lipid and cholesterol levels. Diabetes screening. This is done by checking your blood sugar (  glucose) after you have not eaten for a while (fasting). Hepatitis B test. Hepatitis C test. HIV (human immunodeficiency virus) test. STI (sexually transmitted infection) testing, if you are at risk. Lung cancer screening. Prostate cancer screening. Colorectal cancer screening. Talk with your health care provider about your test results, treatment options, and if necessary, the need for more tests. Follow these instructions at home: Eating and drinking  Eat a diet that includes fresh fruits and vegetables, whole grains, lean protein, and low-fat dairy products. Take vitamin and  mineral supplements as recommended by your health care provider. Do not drink alcohol if your health care provider tells you not to drink. If you drink alcohol: Limit how much you have to 0-2 drinks a day. Know how much alcohol is in your drink. In the [place] one drink equals one 12 oz bottle of beer (355 mL), one 5 oz glass of wine (148 mL), or one 1 oz glass of hard liquor (44 mL). Lifestyle Brush your teeth every morning and night with fluoride toothpaste. Floss one time each day. Exercise for at least 30 minutes 5 or more days each week. Do not use any products that contain nicotine or tobacco. These products include cigarettes, chewing tobacco, and vaping devices, such as e-cigarettes. If you need help quitting, ask your health care provider. Do not use drugs. If you are sexually active, practice safe sex. Use a condom or other form of protection to prevent STIs. Take aspirin  only as told by your health care provider. Make sure that you understand how much to take and what form to take. Work with your health care provider to find out whether it is safe and beneficial for you to take aspirin  daily. Find healthy ways to manage stress, such as: Meditation, yoga, or listening to music. Journaling. Talking to a trusted person. Spending time with friends and family. Minimize exposure to UV radiation to reduce your risk of skin cancer. Safety Always wear your seat belt while driving or riding in a vehicle. Do not drive: If you have been drinking alcohol. Do not ride with someone who has been drinking. When you are tired or distracted. While texting. If you have been using any mind-altering substances or drugs. Wear a helmet and other protective equipment during sports activities. If you have firearms in your house, make sure you follow all gun safety procedures. What's next? Go to your health care provider once a year for an annual wellness visit. Ask your health care provider how often  you should have your eyes and teeth checked. Stay up to date on all vaccines. This information is not intended to replace advice given to you by your health care provider. Make sure you discuss any questions you have with your health care provider. Document Revised: 07/04/2020 Document Reviewed: 07/04/2020 Elsevier Patient Education  2024 Elsevier Inc.        Signed,   Caro Christmas, MD [facility], [facility] 02/04/23 10:56 AM

## 2023-02-05 ENCOUNTER — Telehealth: Payer: Self-pay | Admitting: Family Medicine

## 2023-02-05 DIAGNOSIS — K080 Exfoliation of teeth due to systemic causes: Secondary | ICD-10-CM | POA: Diagnosis not present

## 2023-02-05 NOTE — Telephone Encounter (Signed)
Copied from CRM [id number]. Topic: Clinical - Lab/Test Results >> Feb 05, 2023  8:48 AM Drema Balzarine wrote: Reason for CRM: Patient informed lab results from yesterday were normal per Dr. Chilton Si - patient would like a call back to discuss results today after 1pm.

## 2023-02-05 NOTE — Telephone Encounter (Signed)
Attempted to call patient to notify of normal results and to check if patient had any further questions. No answer. Left a message for patient to call back.

## 2023-02-05 NOTE — Telephone Encounter (Signed)
Called and spoke with Casimiro Needle and he feels better about his result at this time and will be in on Monday for Testosterone and pneumonia vaccine

## 2023-02-06 DIAGNOSIS — I10 Essential (primary) hypertension: Secondary | ICD-10-CM | POA: Diagnosis not present

## 2023-02-06 DIAGNOSIS — Z79890 Hormone replacement therapy (postmenopausal): Secondary | ICD-10-CM | POA: Diagnosis not present

## 2023-02-06 DIAGNOSIS — Z86018 Personal history of other benign neoplasm: Secondary | ICD-10-CM | POA: Diagnosis not present

## 2023-02-06 DIAGNOSIS — E039 Hypothyroidism, unspecified: Secondary | ICD-10-CM | POA: Diagnosis not present

## 2023-02-06 DIAGNOSIS — Z923 Personal history of irradiation: Secondary | ICD-10-CM | POA: Diagnosis not present

## 2023-02-06 DIAGNOSIS — Z952 Presence of prosthetic heart valve: Secondary | ICD-10-CM | POA: Diagnosis not present

## 2023-02-06 DIAGNOSIS — Z48812 Encounter for surgical aftercare following surgery on the circulatory system: Secondary | ICD-10-CM | POA: Diagnosis not present

## 2023-02-06 DIAGNOSIS — I7789 Other specified disorders of arteries and arterioles: Secondary | ICD-10-CM | POA: Diagnosis not present

## 2023-02-06 DIAGNOSIS — I428 Other cardiomyopathies: Secondary | ICD-10-CM | POA: Diagnosis not present

## 2023-02-06 DIAGNOSIS — I083 Combined rheumatic disorders of mitral, aortic and tricuspid valves: Secondary | ICD-10-CM | POA: Diagnosis not present

## 2023-02-09 ENCOUNTER — Ambulatory Visit: Payer: Medicare Other

## 2023-02-11 ENCOUNTER — Ambulatory Visit (INDEPENDENT_AMBULATORY_CARE_PROVIDER_SITE_OTHER): Payer: Medicare Other

## 2023-02-11 DIAGNOSIS — Z23 Encounter for immunization: Secondary | ICD-10-CM | POA: Diagnosis not present

## 2023-02-12 ENCOUNTER — Ambulatory Visit: Payer: Medicare Other | Admitting: Podiatry

## 2023-02-12 NOTE — Progress Notes (Signed)
Mark Gallagher is a 53 y.o. male presents to the office today for Testosterone injection as well as Prevnar 20 per physician's orders. Injection was administered Intramuscular Left deltoid.   Patient's next injection due 2 weeks for testosterone, appt made? yes  [name]

## 2023-02-25 ENCOUNTER — Ambulatory Visit: Payer: Medicare Other

## 2023-02-27 ENCOUNTER — Ambulatory Visit (INDEPENDENT_AMBULATORY_CARE_PROVIDER_SITE_OTHER): Payer: Medicare Other | Admitting: Family Medicine

## 2023-02-27 DIAGNOSIS — E291 Testicular hypofunction: Secondary | ICD-10-CM | POA: Diagnosis not present

## 2023-02-27 NOTE — Progress Notes (Signed)
 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 0.5 Ml given (100mg ) per rx instructions.  Injection was administered Intramuscular Left upper quad. gluteus.  NDC [id number] LOT [id number] EXP 03/19/2024 Patient's next injection due 03/13/2023, appt made? yes  Burnard Gaskins, CMA

## 2023-03-03 ENCOUNTER — Ambulatory Visit: Payer: Medicare Other | Admitting: Podiatry

## 2023-03-03 NOTE — Progress Notes (Signed)
Note reviewed, and agree with documentation and plan. Signed,   Meredith Staggers, MD [facility], [facility] 03/03/23 4:10 PM

## 2023-03-05 MED ORDER — TESTOSTERONE CYPIONATE 200 MG/ML IM SOLN: 100.0000 mg | INTRAMUSCULAR | Status: DC

## 2023-03-05 MED ORDER — TESTOSTERONE CYPIONATE 200 MG/ML IM SOLN
100.0000 mg | INTRAMUSCULAR | Status: AC
  Administered 2022-12-24 – 2024-02-26 (×24): 100 mg via INTRAMUSCULAR

## 2023-03-10 ENCOUNTER — Ambulatory Visit: Payer: Medicare Other | Admitting: Podiatry

## 2023-03-13 ENCOUNTER — Ambulatory Visit (INDEPENDENT_AMBULATORY_CARE_PROVIDER_SITE_OTHER): Payer: Medicare Other

## 2023-03-13 NOTE — Progress Notes (Signed)
Mark Gallagher is a 53 y.o. male presents to the office today for Testosterone Cypionate  per physician's orders. Injection was administered Intramuscular Right upper quad. gluteus.   Patient's next injection due 2 weeks, appt made? yes  [name]

## 2023-03-17 ENCOUNTER — Ambulatory Visit: Payer: Medicare Other | Attending: Cardiology

## 2023-03-17 DIAGNOSIS — Z5181 Encounter for therapeutic drug level monitoring: Secondary | ICD-10-CM | POA: Diagnosis not present

## 2023-03-17 LAB — POCT INR: INR: 1.6 — AB (ref 2.0–3.0)

## 2023-03-17 NOTE — Patient Instructions (Signed)
Continue taking Warfarin 3 tablets daily except for 2 tablets on Tuesdays and Saturdays. Recheck INR 6 weeks.  Coumadin Clinic [phone].

## 2023-03-19 ENCOUNTER — Ambulatory Visit: Payer: Medicare Other | Admitting: Podiatry

## 2023-03-19 DIAGNOSIS — D2372 Other benign neoplasm of skin of left lower limb, including hip: Secondary | ICD-10-CM

## 2023-03-19 DIAGNOSIS — M79676 Pain in unspecified toe(s): Secondary | ICD-10-CM | POA: Diagnosis not present

## 2023-03-19 DIAGNOSIS — B351 Tinea unguium: Secondary | ICD-10-CM | POA: Diagnosis not present

## 2023-03-19 DIAGNOSIS — D2371 Other benign neoplasm of skin of right lower limb, including hip: Secondary | ICD-10-CM | POA: Diagnosis not present

## 2023-03-19 NOTE — Progress Notes (Signed)
 He presents today chief complaint of painful elongated toenails and calluses bilaterally.  Objective: Pulses are strongly palpable.  Toenails are long thick yellow dystrophic with mycotic benign skin lesion plantar aspect subfifth metatarsal head of the right foot.  Assessment: Pain limb secondary to onychomycosis and benign skin lesions.  Plan: Debridement of benign skin lesions debridement of toenails 1 through 5 bilateral.

## 2023-03-23 ENCOUNTER — Encounter: Payer: Self-pay | Admitting: Family Medicine

## 2023-03-23 ENCOUNTER — Ambulatory Visit: Admitting: Family Medicine

## 2023-03-23 VITALS — BP 100/70 | HR 94 | Temp 97.9°F | Ht 78.0 in | Wt 251.0 lb

## 2023-03-23 DIAGNOSIS — R0981 Nasal congestion: Secondary | ICD-10-CM | POA: Diagnosis not present

## 2023-03-23 LAB — POC INFLUENZA A&B (BINAX/QUICKVUE)
Influenza A, POC: NEGATIVE
Influenza B, POC: NEGATIVE

## 2023-03-23 LAB — POC COVID19 BINAXNOW: SARS Coronavirus 2 Ag: NEGATIVE

## 2023-03-23 NOTE — Progress Notes (Signed)
   Subjective:    Patient ID: Mark Gallagher, male    DOB: 22-Dec-1970, 53 y.o.   MRN: [id number]  HPI URI- pt reports he woke up Saturday morning w/ runny nose and sore throat.  Then developed sneezing, watery eyes.  Sunday developed 'a little bit of congestion'.  No cough.  Mild HA.  No ear pain.  No documented fever- Tm 99.5.  + sick contacts.   Review of Systems For ROS see HPI     Objective:   Physical Exam Vitals reviewed.  Constitutional:      General: He is not in acute distress.    Appearance: He is well-developed.  HENT:     Head: Normocephalic and atraumatic.     Right Ear: Tympanic membrane normal.     Left Ear: Tympanic membrane normal.     Nose: Congestion present. No rhinorrhea.     Right Sinus: No maxillary sinus tenderness or frontal sinus tenderness.     Left Sinus: No maxillary sinus tenderness or frontal sinus tenderness.     Mouth/Throat:     Pharynx: No oropharyngeal exudate.  Cardiovascular:     Rate and Rhythm: Normal rate and regular rhythm.     Heart sounds: Normal heart sounds.  Pulmonary:     Effort: Pulmonary effort is normal. No respiratory distress.     Breath sounds: Normal breath sounds. No wheezing or rales.  Musculoskeletal:     Cervical back: Normal range of motion and neck supple.  Lymphadenopathy:     Cervical: No cervical adenopathy.  Skin:    General: Skin is warm.     Findings: No rash.  Neurological:     Mental Status: He is alert and oriented to person, place, and time. Mental status is at baseline.  Psychiatric:        Mood and Affect: Mood normal.        Behavior: Behavior normal.        Thought Content: Thought content normal.           Assessment & Plan:  Nasal congestion- new.  Appears to be combination of viral/allergic sxs.  No evidence of bacterial infxn on PE- no need for abx.  Reviewed supportive care and red flags that should prompt return.  Pt expressed understanding and is in agreement w/ plan.

## 2023-03-23 NOTE — Patient Instructions (Signed)
 Follow up as needed or as scheduled This appears to be a viral illness w/ some allergy contribution START daily Claritin or Zyrtec (store brand generic works just as well)- this will help w/ congestion and sneezing Make sure you are getting plenty of rest! Call with any questions or concerns- particularly if symptoms aren't improving Hang in there!!!

## 2023-03-25 ENCOUNTER — Other Ambulatory Visit: Payer: Self-pay

## 2023-03-25 ENCOUNTER — Ambulatory Visit: Payer: Self-pay | Admitting: Family Medicine

## 2023-03-25 NOTE — Telephone Encounter (Signed)
 I agree that this sounds like a mild viral infection, and it may take a little bit more time for symptoms to improve.  He does have a history of adrenal insufficiency. During certain minor illness, it is sometimes recommended to increase glucocorticoid dose slightly for a few days, this should be discussed and managed by his endocrinologist.  But on my reading through [employer], an increase in glucocorticoid dose is not necessary for mild symptoms such as congestion in the absence of fever.  Again he should discuss this with endocrinology.  They should be the ones to decide if any needed change in his steroid dosing or additional steroids.

## 2023-03-25 NOTE — Telephone Encounter (Signed)
 FYI: Called patient and discussed with him, he was not pleased as he feels he should be better by now I did express there is not a treatment for his illness to make it go away that we are trying to get him feeling better by treating sxs. Patient requested appt. I encouraged him to call back Friday if he feels he is worsening, patient insisted on appt for Friday stating he wants to feel better, see his family and go to [employer] this week

## 2023-03-25 NOTE — Telephone Encounter (Signed)
 Viral illnesses can last anywhere from 7-10 days and the congestion can last longer than that, but should be improving

## 2023-03-25 NOTE — Telephone Encounter (Signed)
 Nasal congestion is not likely to resolve in 2 days.  The message indicates that his sxs are showing some improvement.  At the time of his appt, he had a viral URI w/ minor symptoms that had started earlier that same day (nasal congestion).  I do not feel he needs prednisone but I am routing this to his PCP who is more familiar w/ his history to see if this is something he wants to prescribe

## 2023-03-25 NOTE — Telephone Encounter (Signed)
 Copied from CRM [id number]. Topic: Clinical - Pink Word Triage >> Mar 25, 2023 10:30 AM Fonda Kinder J wrote: Reason for Triage: Pt was seen on Monday and states he is still having congestion and doesn't know what to do   Chief Complaint: Sinus congestion  Symptoms: Sinus congestion, sore throat  Frequency: Constant  Disposition: [] ED /[] Urgent Care (no appt availability in office) / [] Appointment(In office/virtual)/ []  [facility]/ [] Home Care/ [] Refused Recommended Disposition /[] [facility]/ [x]  Follow-up with PCP Additional Notes: Patient reports he was seen 2 days ago for sinus congestion. He states he was instructed to call back if his symptoms have not improved. Patient reports minor improvement but is still experiencing symptoms. Patient is concerned about getting pneumonia and would like a prescription for Prednisone.   Patient would like a call with a response to his request today if possible.     Reason for Disposition  [1] Caller has NON-URGENT question (includes prescribed medication questions) AND [2] triager unable to answer  Answer Assessment - Initial Assessment Questions 1. LOCATION: "Where does it hurt?"      Sinus congestoin and pain  2. ONSET: "When did the sinus pain start?"  (e.g., hours, days)      4 days ago  3. SEVERITY: "How bad is the pain?"   (Scale 1-10; mild, moderate or severe)   - MILD (1-3): doesn't interfere with normal activities    - MODERATE (4-7): interferes with normal activities (e.g., work or school) or awakens from sleep   - SEVERE (8-10): excruciating pain and patient unable to do any normal activities        Mild 4. RECURRENT SYMPTOM: "Have you ever had sinus problems before?" If Yes, ask: "When was the last time?" and "What happened that time?"      Yes, usually placed on Prednisone  5. NASAL CONGESTION: "Is the nose blocked?" If Yes, ask: "Can you open it or must you breathe through your mouth?"     Yes 6. NASAL DISCHARGE:  "Do you have discharge from your nose?" If so ask, "What color?"     Clear  7. FEVER: "Do you have a fever?" If Yes, ask: "What is it, how was it measured, and when did it start?"      No 8. OTHER SYMPTOMS: "Do you have any other symptoms?" (e.g., sore throat, cough, earache, difficulty breathing)     Sore throat  Answer Assessment - Initial Assessment Questions 1. MAIN CONCERN OR SYMPTOM:  "What is your main concern right now?" "What question do you have?" "What's the main symptom you're worried about?" (e.g., breathing difficulty, cough, fever. pain)     Sinus congestion  2. ONSET: "When did the congestion start?"     4 days ago  3. BETTER-SAME-WORSE: "Are you getting better, staying the same, or getting worse compared to how you felt at your last visit to the doctor (most recent medical visit)?"     Improved but 4. VISIT DATE: "When were you seen?" (Date)     03/23/23 5. VISIT DOCTOR: "What is the name of the doctor taking care of you now?"     Dr. Beverely Low 6. VISIT DIAGNOSIS:  "What was the main symptom or problem that you were seen for?" "Were you given a diagnosis?"      Nasal congestion  7. VISIT MEDICINES: "Did the doctor order any new medicines for you to use?" If Yes, ask: "Have you filled the prescription and started taking the medicine?"  OTC antihistamines  8. NEXT APPOINTMENT: "Have you scheduled a follow-up appointment with your doctor?"     No 9. PAIN: "Is there any pain?" If Yes, ask: "How bad is it?"  (Scale 0-10; or mild, moderate, severe)    - NONE (0): no pain    - MILD (1-3): doesn't interfere with normal activities     - MODERATE (4-7): interferes with normal activities or awakens from sleep     - SEVERE (8-10): excruciating pain, unable to do any normal activities     Mild 10. FEVER: "Do you have a fever?" If Yes, ask: "What is it, how was it measured  and when did it start?"       No 11. OTHER SYMPTOMS: "Do you have any other symptoms?"       Sore  throat  Protocols used: Sinus Pain or Congestion-A-AH, Recent Medical Visit for Illness Follow-up Call-A-[facility]

## 2023-03-25 NOTE — Telephone Encounter (Signed)
 Pt states he had 5 days of congestion and he feels like the congestion should have cleared up by now. How long should he expect the congestion?

## 2023-03-26 NOTE — Telephone Encounter (Signed)
 Pt needs to hear Dr Paralee Cancel message and that his PCP is in agreement w/ the treatment plan.  I see that he demanded an appt, but this is again w/ me and unless there have been changes to his sxs, he still needs to give this more time.  I fear he will be disappointed with the outcome of tomorrow's appt, but I am happy to see him.

## 2023-03-26 NOTE — Telephone Encounter (Signed)
 Spoke with pt and he states he is having congestion and mucous . No new sx. Spoke with pt for 20 mins due to pt asking several times why he was not being treated with antibiotics.  He was very upset after talking he decided he would see Dr.Tabori on Monday for congestion. He asked if Monday he would be treated and I let him know that I could not say for sure. Pt appt has been notified

## 2023-03-27 ENCOUNTER — Ambulatory Visit: Payer: Medicare Other

## 2023-03-27 ENCOUNTER — Ambulatory Visit: Admitting: Family Medicine

## 2023-03-30 ENCOUNTER — Ambulatory Visit: Admitting: Family Medicine

## 2023-03-30 ENCOUNTER — Ambulatory Visit (INDEPENDENT_AMBULATORY_CARE_PROVIDER_SITE_OTHER)

## 2023-03-30 ENCOUNTER — Ambulatory Visit

## 2023-03-30 DIAGNOSIS — E291 Testicular hypofunction: Secondary | ICD-10-CM | POA: Diagnosis not present

## 2023-03-30 NOTE — Progress Notes (Signed)
 Mark Gallagher is a 53 y.o. male presents to the office today for Testosterone cypionate per physician's orders. Injection was administered Intramuscular Right upper quad. gluteus.   Patient's next injection due 14 days, appt made? yes  [employer]

## 2023-04-02 ENCOUNTER — Encounter: Payer: Medicare Other | Admitting: Family Medicine

## 2023-04-08 ENCOUNTER — Telehealth: Payer: Self-pay | Admitting: Cardiovascular Disease

## 2023-04-08 ENCOUNTER — Other Ambulatory Visit: Payer: Self-pay | Admitting: Cardiovascular Disease

## 2023-04-08 DIAGNOSIS — Z952 Presence of prosthetic heart valve: Secondary | ICD-10-CM

## 2023-04-08 DIAGNOSIS — Z5181 Encounter for therapeutic drug level monitoring: Secondary | ICD-10-CM

## 2023-04-08 DIAGNOSIS — I483 Typical atrial flutter: Secondary | ICD-10-CM

## 2023-04-08 MED ORDER — WARFARIN SODIUM 5 MG PO TABS: ORAL_TABLET | ORAL | 1 refills | Status: DC

## 2023-04-08 NOTE — Telephone Encounter (Signed)
*  STAT* If patient is at the pharmacy, call can be transferred to refill team.   1. Which medications need to be refilled? (please list name of each medication and dose if known)   warfarin (COUMADIN) 5 MG tablet   2. Would you like to learn more about the convenience, safety, & potential cost savings by using the [facility]?   3. Are you open to using the [facility] (Type [facility]. ).  4. Which pharmacy/location (including street and city if local pharmacy) is medication to be sent to?  [facility] - [place], [place] - [address]   5. Do they need a 30 day or 90 day supply?   Patient stated he currently has 8 days left of this medication and wants to get refill for 1 year.  Patient wants a call back to confirm prescription sent.

## 2023-04-08 NOTE — Telephone Encounter (Signed)
 Prescription refill request received for warfarin Lov: 02/20/22 Allyson Sabal)  Next INR check: 04/28/23 Warfarin tablet strength: 5mg   Per cardiology appt note on 03/12/22 "Your next appointment:  2 year(s)"  I called and spoke with pt. Made him aware since Warfarin dosage changes at times and requires frequent INR monitoring, we are unable to refill this medication for a year supply. However, I can send a 90 day supply to mail pharmacy. Pt verbalized understanding.

## 2023-04-08 NOTE — Telephone Encounter (Signed)
 Warfarin 5mg  refill  Last INR 03/17/23 Last OV 03/12/22 & due back in 2 years per last OV note  Refill sent to requested pharmacy at 1118am. Will not resend

## 2023-04-09 DIAGNOSIS — I428 Other cardiomyopathies: Secondary | ICD-10-CM | POA: Diagnosis not present

## 2023-04-09 DIAGNOSIS — Z8679 Personal history of other diseases of the circulatory system: Secondary | ICD-10-CM | POA: Diagnosis not present

## 2023-04-09 DIAGNOSIS — Z952 Presence of prosthetic heart valve: Secondary | ICD-10-CM | POA: Diagnosis not present

## 2023-04-09 DIAGNOSIS — I483 Typical atrial flutter: Secondary | ICD-10-CM | POA: Diagnosis not present

## 2023-04-13 ENCOUNTER — Ambulatory Visit (INDEPENDENT_AMBULATORY_CARE_PROVIDER_SITE_OTHER)

## 2023-04-13 DIAGNOSIS — E291 Testicular hypofunction: Secondary | ICD-10-CM | POA: Diagnosis not present

## 2023-04-13 NOTE — Progress Notes (Signed)
 Mark Gallagher is a 53 y.o. male presents to the office today for Testosterone Cypionate per physician's orders. Injection was administered Intramuscular Left upper quad. gluteus.   Patient's next injection due 04/27/2023, appt made? yes  Mark Gallagher

## 2023-04-27 DIAGNOSIS — E890 Postprocedural hypothyroidism: Secondary | ICD-10-CM | POA: Diagnosis not present

## 2023-04-27 DIAGNOSIS — E220 Acromegaly and pituitary gigantism: Secondary | ICD-10-CM | POA: Diagnosis not present

## 2023-04-27 DIAGNOSIS — E2740 Unspecified adrenocortical insufficiency: Secondary | ICD-10-CM | POA: Diagnosis not present

## 2023-04-28 ENCOUNTER — Ambulatory Visit: Payer: Medicare Other | Attending: Cardiology

## 2023-04-28 DIAGNOSIS — Z5181 Encounter for therapeutic drug level monitoring: Secondary | ICD-10-CM | POA: Diagnosis not present

## 2023-04-28 LAB — POCT INR: INR: 1.8 — AB (ref 2.0–3.0)

## 2023-04-28 NOTE — Patient Instructions (Signed)
Continue taking Warfarin 3 tablets daily except for 2 tablets on Tuesdays and Saturdays. Recheck INR 6 weeks.  Coumadin Clinic [phone].

## 2023-04-29 ENCOUNTER — Ambulatory Visit

## 2023-04-29 DIAGNOSIS — E291 Testicular hypofunction: Secondary | ICD-10-CM | POA: Diagnosis not present

## 2023-04-29 NOTE — Progress Notes (Signed)
 Mark Gallagher is a 53 y.o. male presents to the office today for Testosterone Cypionate injection per physician's orders. Injection was administered Intramuscular Right upper quad. gluteus.   Patient's next injection due 05/13/2023 , appt made? yes  [name]

## 2023-04-30 ENCOUNTER — Ambulatory Visit: Payer: Medicare Other | Admitting: Podiatry

## 2023-04-30 ENCOUNTER — Encounter: Payer: Self-pay | Admitting: Podiatry

## 2023-04-30 DIAGNOSIS — D2371 Other benign neoplasm of skin of right lower limb, including hip: Secondary | ICD-10-CM

## 2023-04-30 DIAGNOSIS — D2372 Other benign neoplasm of skin of left lower limb, including hip: Secondary | ICD-10-CM

## 2023-04-30 DIAGNOSIS — B351 Tinea unguium: Secondary | ICD-10-CM

## 2023-04-30 DIAGNOSIS — M79676 Pain in unspecified toe(s): Secondary | ICD-10-CM

## 2023-04-30 NOTE — Progress Notes (Signed)
 He presents today chief complaint of painful elongated toenails and calluses bilaterally.  Objective: Pulses are strongly palpable.  Toenails are long thick yellow dystrophic with mycotic benign skin lesion plantar aspect subfifth metatarsal head of the right foot.  Assessment: Pain limb secondary to onychomycosis and benign skin lesions.  Plan: Debridement of benign skin lesions debridement of toenails 1 through 5 bilateral.

## 2023-05-14 ENCOUNTER — Ambulatory Visit

## 2023-05-14 DIAGNOSIS — E291 Testicular hypofunction: Secondary | ICD-10-CM

## 2023-05-14 NOTE — Progress Notes (Addendum)
 Mark Gallagher is a 53 y.o. male presents to the office today for Testosterone  injection per physician's orders. Injection was administered Intramuscular Right upper quad. gluteus.   Patient's next injection due 2 weeks, appt made? yes  Mark Gallagher

## 2023-05-28 ENCOUNTER — Ambulatory Visit

## 2023-05-28 DIAGNOSIS — E291 Testicular hypofunction: Secondary | ICD-10-CM | POA: Diagnosis not present

## 2023-05-28 NOTE — Progress Notes (Addendum)
 Mark Gallagher is a 53 y.o. male presents to the office today for testosterone  injection per physician's orders. Injection was administered Intramuscular Left upper quad. gluteus.   Patient's next injection due 06/11/2023, appt made? yes  [facility] R [name]

## 2023-06-09 ENCOUNTER — Ambulatory Visit: Attending: Cardiovascular Disease | Admitting: *Deleted

## 2023-06-09 DIAGNOSIS — Z5181 Encounter for therapeutic drug level monitoring: Secondary | ICD-10-CM | POA: Diagnosis not present

## 2023-06-09 LAB — POCT INR: INR: 1.8 — AB (ref 2.0–3.0)

## 2023-06-09 NOTE — Patient Instructions (Signed)
Description   Continue taking Warfarin 3 tablets daily except for 2 tablets on Tuesdays and Saturdays. Recheck INR 6 weeks. [facility]madin Clinic [phone].       

## 2023-06-10 ENCOUNTER — Ambulatory Visit (INDEPENDENT_AMBULATORY_CARE_PROVIDER_SITE_OTHER)

## 2023-06-10 DIAGNOSIS — E291 Testicular hypofunction: Secondary | ICD-10-CM

## 2023-06-10 NOTE — Progress Notes (Signed)
 Mark Gallagher is a 53 y.o. male presents to the office today for testosterone  Injection per physician's orders. Injection was administered Intramuscular Left upper quad. gluteus.   Patient's next injection due 06/2023, appt made? yes  [facility]

## 2023-06-11 ENCOUNTER — Ambulatory Visit: Admitting: Podiatry

## 2023-06-11 DIAGNOSIS — D2372 Other benign neoplasm of skin of left lower limb, including hip: Secondary | ICD-10-CM | POA: Diagnosis not present

## 2023-06-11 DIAGNOSIS — B351 Tinea unguium: Secondary | ICD-10-CM

## 2023-06-11 DIAGNOSIS — D2371 Other benign neoplasm of skin of right lower limb, including hip: Secondary | ICD-10-CM | POA: Diagnosis not present

## 2023-06-11 DIAGNOSIS — M79676 Pain in unspecified toe(s): Secondary | ICD-10-CM

## 2023-06-11 NOTE — Progress Notes (Signed)
 He presents today chief complaint of painful elongated toenails and calluses bilaterally.  Objective: Pulses are strongly palpable.  Toenails are long thick yellow dystrophic with mycotic benign skin lesion plantar aspect subfifth metatarsal head of the right foot.  Assessment: Pain limb secondary to onychomycosis and benign skin lesions.  Plan: Debridement of benign skin lesions debridement of toenails 1 through 5 bilateral.

## 2023-06-16 NOTE — Progress Notes (Signed)
   NDC provided above

## 2023-06-25 ENCOUNTER — Ambulatory Visit (INDEPENDENT_AMBULATORY_CARE_PROVIDER_SITE_OTHER): Admitting: Family Medicine

## 2023-06-25 DIAGNOSIS — E349 Endocrine disorder, unspecified: Secondary | ICD-10-CM

## 2023-06-25 NOTE — Progress Notes (Signed)
 Mark Gallagher is a 53 y.o. male presents to the office today for Testosterone  per Dr.Greenes order.  Patient's next injection due 14 days , appt made? no  [name]

## 2023-07-09 ENCOUNTER — Ambulatory Visit

## 2023-07-09 DIAGNOSIS — E349 Endocrine disorder, unspecified: Secondary | ICD-10-CM | POA: Diagnosis not present

## 2023-07-09 DIAGNOSIS — E291 Testicular hypofunction: Secondary | ICD-10-CM | POA: Diagnosis not present

## 2023-07-09 NOTE — Progress Notes (Unsigned)
 Mark Gallagher is a 53 y.o. male presents to the office today for Testerone injection 0.5  per physician's orders. Injection was administered  Left upper quad. gluteus.   Patient's next injection due , appt made? yes  [name]

## 2023-07-09 NOTE — Progress Notes (Unsigned)
 Error

## 2023-07-16 ENCOUNTER — Encounter: Payer: Self-pay | Admitting: Podiatry

## 2023-07-16 ENCOUNTER — Ambulatory Visit: Admitting: Podiatry

## 2023-07-16 DIAGNOSIS — B351 Tinea unguium: Secondary | ICD-10-CM

## 2023-07-16 DIAGNOSIS — D2371 Other benign neoplasm of skin of right lower limb, including hip: Secondary | ICD-10-CM | POA: Diagnosis not present

## 2023-07-16 DIAGNOSIS — M79676 Pain in unspecified toe(s): Secondary | ICD-10-CM

## 2023-07-16 DIAGNOSIS — D2372 Other benign neoplasm of skin of left lower limb, including hip: Secondary | ICD-10-CM

## 2023-07-17 NOTE — Progress Notes (Signed)
 He presents today chief complaint of painful elongated toenails and calluses bilaterally.  Objective: Pulses are strongly palpable.  Toenails are long thick yellow dystrophic with mycotic benign skin lesion plantar aspect subfifth metatarsal head of the right foot.  Assessment: Pain limb secondary to onychomycosis and benign skin lesions.  Plan: Debridement of benign skin lesions debridement of toenails 1 through 5 bilateral.

## 2023-07-21 ENCOUNTER — Ambulatory Visit

## 2023-07-21 ENCOUNTER — Ambulatory Visit: Attending: Cardiovascular Disease

## 2023-07-21 DIAGNOSIS — Z5181 Encounter for therapeutic drug level monitoring: Secondary | ICD-10-CM

## 2023-07-21 LAB — POCT INR: INR: 1.9 — AB (ref 2.0–3.0)

## 2023-07-21 NOTE — Patient Instructions (Signed)
Continue taking Warfarin 3 tablets daily except for 2 tablets on Tuesdays and Saturdays. Recheck INR 6 weeks.  Coumadin Clinic [phone].

## 2023-07-21 NOTE — Progress Notes (Signed)
Please see anticoagulation encounter.

## 2023-07-23 ENCOUNTER — Ambulatory Visit

## 2023-07-27 ENCOUNTER — Ambulatory Visit

## 2023-07-28 ENCOUNTER — Ambulatory Visit

## 2023-07-28 DIAGNOSIS — E291 Testicular hypofunction: Secondary | ICD-10-CM | POA: Diagnosis not present

## 2023-07-28 NOTE — Progress Notes (Signed)
 Mark Gallagher is a 53 y.o. male presents to the office today for Testosterone  injection  per physician's orders. Injection was administered Intramuscular Right upper quad. gluteus.   Patient's next injection due 2 weeks, appt made? yes  Mark Gallagher

## 2023-08-05 ENCOUNTER — Ambulatory Visit (INDEPENDENT_AMBULATORY_CARE_PROVIDER_SITE_OTHER): Payer: Medicare Other | Admitting: Family Medicine

## 2023-08-05 ENCOUNTER — Encounter: Payer: Self-pay | Admitting: Family Medicine

## 2023-08-05 ENCOUNTER — Ambulatory Visit: Payer: Self-pay | Admitting: Family Medicine

## 2023-08-05 VITALS — BP 100/70 | HR 60 | Temp 97.9°F | Ht 78.0 in | Wt 250.0 lb

## 2023-08-05 DIAGNOSIS — Z952 Presence of prosthetic heart valve: Secondary | ICD-10-CM

## 2023-08-05 DIAGNOSIS — E2740 Unspecified adrenocortical insufficiency: Secondary | ICD-10-CM

## 2023-08-05 DIAGNOSIS — E890 Postprocedural hypothyroidism: Secondary | ICD-10-CM

## 2023-08-05 DIAGNOSIS — D696 Thrombocytopenia, unspecified: Secondary | ICD-10-CM

## 2023-08-05 DIAGNOSIS — Z7901 Long term (current) use of anticoagulants: Secondary | ICD-10-CM | POA: Diagnosis not present

## 2023-08-05 DIAGNOSIS — E291 Testicular hypofunction: Secondary | ICD-10-CM

## 2023-08-05 LAB — CBC
HCT: 42.8 % (ref 39.0–52.0)
Hemoglobin: 14.3 g/dL (ref 13.0–17.0)
MCHC: 33.4 g/dL (ref 30.0–36.0)
MCV: 94.9 fl (ref 78.0–100.0)
Platelets: 145 K/uL — ABNORMAL LOW (ref 150.0–400.0)
RBC: 4.51 Mil/uL (ref 4.22–5.81)
RDW: 13 % (ref 11.5–15.5)
WBC: 4.8 K/uL (ref 4.0–10.5)

## 2023-08-05 LAB — TESTOSTERONE: Testosterone: 386.57 ng/dL (ref 300.00–890.00)

## 2023-08-05 NOTE — Patient Instructions (Signed)
 Thanks for coming in today.  I will check your blood counts as the platelets were borderline low on most recent testing.  I am also checking a testosterone  level as that has not been checked since last year, and with injection about a week ago now is a good time to check that level.  Other labs can be checked at your upcoming appointment with endocrinology.  No med changes at this time.  I will see you in 6 months but let me know if there are questions in the meantime.  Sorry to hear about your dad's health.  Keep up the good work with walking, and it sounds like you have a good support system at this time, but please let me know if there are any ways I can help.  Hang in there!

## 2023-08-05 NOTE — Progress Notes (Signed)
 Subjective:  Patient ID: Mark Gallagher, male    DOB: 1970/09/29  Age: 53 y.o. MRN: [id number]  CC:  Chief Complaint  Patient presents with   Follow-up    58mo; doing great, no concerns nothing new    HPI Mark Gallagher presents for  Chronic condition follow-up.  Physical in January.  Endocrine Followed by endocrinology with history of acromegaly, chronic adrenal insufficiency, hypothyroidism, hypogonadism and treated with testosterone  supplementation.  He is followed through [facility] with endocrinology, but has received testosterone  through our office.  Followed by endocrinology routinely with labs.  He uses testosterone  100 mg every 2 weeks, levothyroxine  200 mcg daily for postoperative hypothyroidism, hydrocortisone  10 mg twice daily for adrenal insufficiency.   Appt with Dr. Phebe in April. MRI rain ordered - will see neurosurgery in November. Last testosterone  injection 8 days ago.   Lab Results  Component Value Date   PSA 0.22 02/04/2023   PSA 0.17 01/16/2015   PSA CANCELED 12/07/2014  Outside labs reviewed, CBC on 04/27/2023 with hemoglobin of 13.7, hematocrit 41.6, normal.  Borderline platelets at 138, 153 in September 2024.  Ferritin 218.  Insulin  like growth factor I normal at 154.  Borderline low AST at 13, BUN 22, but otherwise normal CMP.  Free T4 normal at 0.92, A1c normal at 5.2. No recent testosterone  level since September of last year, but normal at 268 at that time.  Last lipid panel in 2023, normal.  LDL 53. Appointment with endocrinology in September. Osteopenia on left hip prior bone density in September 2023, repeat this year.   Cardiac History of aortic valve replacement, thoracic aortic aneurysm resection and grafting with mechanical aortic valve by Dr. Vinita at [facility] in September 2020.  He is on Coumadin  for anticoagulation, history of WPW syndrome with local cardiologist Dr. Court.  Annual exams with Dr. Vinita with MRI.  Cardiac MRI, MRI in January. History of  atrial flutter, hypertension, cardiomyopathy and dyslipidemia managed by cardiology.  Treated with eplerenone, atorvastatin , metoprolol , Coumadin  without any new bleeding.  Followed by Coumadin  clinic for anticoagulation.  Prior plan for adding ACE inhibitor if trending systolic blood pressure elevations.  BP Readings from Last 3 Encounters:  08/05/23 100/70  03/23/23 100/70  02/04/23 112/60   Father in hospice. Hard to see initially, but doing ok now.  Walking has been helpful for managing stress, and reading through bible and that has been helpful. Attends [employer]. Has talked with pastor. Feels like he has resources needed at this time - declines additional assistance. Also has friend that had stroke a year ago, in rehab. Another stressor - sees him every 2-3 weeks.   HM: Declines covid booster. Will schedule AWV.   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   Hyperlipidemia 11/20/2020   Thoracic aortic aneurysm (HCC) 11/16/2018   Hiccups 10/17/2018   Thrombocytopenia (HCC) 10/14/2018   Dyslipidemia 10/11/2018   HTN (hypertension) 10/11/2018   Enlargement of aortic root (HCC) 04/06/2018   Chronic adrenal insufficiency (HCC) 03/26/2018   Hyponatremia 03/26/2018   Anasarca 03/26/2018   Hyperbilirubinemia 03/26/2018   Shock circulatory (HCC)    Primary localized osteoarthritis of hip 03/16/2018   S/P total hip arthroplasty 03/16/2018   Hypothyroidism 02/22/2018   History of fusion of cervical spine 02/22/2018   Iron deficiency anemia 02/22/2018   Acquired left foot drop 02/22/2018   Primary osteoarthritis of right hip 02/22/2018  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   Constipation 12/15/2016   Traumatic closed fracture of distal clavicle with minimal displacement, left, with routine healing, subsequent encounter 03/24/2016   Left knee pain 09/25/2015   Frequent  PVCs 03/13/2015   Instability of left knee joint 10/20/2014   Adjustment disorder with depressed mood    Tetraplegia (HCC) 09/19/2014   Acute blood loss anemia 09/19/2014   Cervical spondylosis with myelopathy 09/18/2014   Typical atrial flutter (HCC) 09/14/2014   Radiculopathy of arm 08/28/2014   Hypogonadism male 09/08/2013   Testosterone  deficiency 01/29/2012   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   moderate atypia - left post knee - widershave (free)   Atypical nevus 08/17/2014   moderate atypia - right mid back    Atypical nevus 12/20/2015   moderate to severe atypia - upper mid back - widershave   Atypical nevus 09/18/2017   mild atypia - left anterior back   Atypical nevus 09/18/2017   moderate atypia - left posterior back   Atypical nevus 07/16/2018   marked atypical - right neck, lower   Blood transfusion without reported diagnosis    Cancer (HCC)    atypical mole - no cancer per pt    History of pituitary tumor    Hypertension    pt denies - on metoprol for heart    Hypothyroidism    Knee pain, bilateral    Pneumonia    Testosterone  deficiency    Thyroid  disease    Past Surgical History:  Procedure Laterality Date   CARDIAC ELECTROPHYSIOLOGY STUDY AND ABLATION     CERVICAL DISC SURGERY  2007   Anterior C4/5 vertebrectomy and c3-C6 laminoplasty with plating   CRANIOTOMY FOR TUMOR     pituitary adenoma   SPINAL FUSION     2007, 2016 c3- t 2    THORACIC AORTIC ANEURYSM REPAIR     with Aortic valve replacement    TOTAL HIP ARTHROPLASTY Right 03/16/2018   Procedure: TOTAL HIP ARTHROPLASTY Anterior;  Surgeon: Beverley Evalene BIRCH, MD;  Location: [facility];  Service: Orthopedics;  Laterality: Right;   Allergies  Allergen  Reactions   Oxycodone  Nausea Only   Quinolones     Other reaction(s): Other (See Comments) Fluroquinolone antibiotics should be avoided in patients with history of aortic aneurysm/dissection ([email or web address])   Prior to Admission medications   Medication Sig Start Date End Date Taking? Authorizing Provider  atorvastatin  (LIPITOR) 40 MG tablet Take 40 mg by mouth daily.   Yes [provider]  Cholecalciferol (VITAMIN D3) 25 MCG (1000 UT) CAPS Take 2,000 Units by mouth daily.    Yes [provider]  eplerenone (INSPRA) 25 MG tablet Take 25 mg by mouth daily. 07/09/20  Yes [provider]  ferrous sulfate  325 (65 FE) MG tablet Take 1 tablet (325 mg total) by mouth 2 (two) times daily with a meal. 10/05/14  Yes Love, Sharlet RAMAN, PA-C  hydrocortisone  (CORTEF ) 10 MG tablet Take 1 tablet daily.  You may take 2 tablets daily with excessive weakness and tiredness and lethargy. Patient taking differently: Take 10 mg by mouth daily. 03/23/18  Yes Will Almarie MATSU, MD  levothyroxine  (SYNTHROID , LEVOTHROID) 200 MCG tablet Take  1 tablet (200 mcg total) by mouth daily. 10/05/14  Yes Love, Sharlet RAMAN, PA-C  metoprolol  succinate (TOPROL -XL) 25 MG 24 hr tablet Take 25 mg by mouth daily. 06/13/19  Yes [provider]  Multiple Vitamins-Minerals (CENTRUM SILVER PO) Take 1 tablet by mouth daily.   Yes [provider]  SOMAVERT 20 MG SOLR Inject into the skin. 07/26/20  Yes [provider]  warfarin (COUMADIN ) 5 MG tablet TAKE 2 TABLETS TO 3 TABLETS BY MOUTH ONCE DAILY AS DIRECTED BY COUMADIN  CLINIC 04/08/23  Yes Court Dorn PARAS, MD   Social History   Socioeconomic History   Marital status: Single    Spouse name: Not on file   Number of children: Not on file   Years of education: Not on file   Highest education level: Not on file  Occupational History   Not on file  Tobacco Use   Smoking status: Never   Smokeless tobacco:  Never  Vaping Use   Vaping status: Never Used  Substance and Sexual Activity   Alcohol use: No   Drug use: No   Sexual activity: Not Currently  Other Topics Concern   Not on file  Social History Narrative   Not on file   Social Drivers of Health   Financial Resource Strain: Low Risk  (10/09/2022)   Received from [facility]   Overall Financial Resource Strain (CARDIA)    Difficulty of Paying Living Expenses: Not hard at all  Food Insecurity: No Food Insecurity (04/27/2023)   Received from [facility]   Hunger Vital Sign    Within the past 12 months, you worried that your food would run out before you got the money to buy more.: Never true    Within the past 12 months, the food you bought just didn't last and you didn't have money to get more.: Never true  Transportation Needs: No Transportation Needs (04/27/2023)   Received from [facility] - Transportation    In the past 12 months, has lack of transportation kept you from medical appointments or from getting medications?: No    Lack of Transportation (Non-Medical): No  Physical Activity: Sufficiently Active (12/22/2020)   Received from [facility]   Exercise Vital Sign    On average, how many days per week do you engage in moderate to strenuous exercise (like a brisk walk)?: 6 days    On average, how many minutes do you engage in exercise at this level?: 50 min  Stress: No Stress Concern Present (12/22/2020)   Received from [facility] - Occupational Stress Questionnaire    Feeling of Stress : Not at all  Social Connections: Not on file  Intimate Partner Violence: Not on file    Review of Systems   Objective:   Vitals:   08/05/23 1017  BP: 100/70  Pulse: 60  Temp: 97.9 F (36.6 C)  SpO2: 97%  Weight: 250 lb (113.4 kg)  Height: 6' 6 (1.981 m)     Physical Exam Vitals  reviewed.  Constitutional:      Appearance: He is well-developed.  HENT:     Head: Normocephalic and atraumatic.  Neck:     Vascular: No carotid bruit or JVD.  Cardiovascular:     Rate and Rhythm: Normal rate and regular rhythm.     Heart sounds: Normal heart sounds. No murmur (S2 click.) heard. Pulmonary:  Effort: Pulmonary effort is normal.     Breath sounds: Normal breath sounds. No rales.  Musculoskeletal:     Right lower leg: No edema.     Left lower leg: No edema.  Skin:    General: Skin is warm and dry.  Neurological:     Mental Status: He is alert and oriented to person, place, and time.  Psychiatric:        Mood and Affect: Mood normal.        Assessment & Plan:  Mark Gallagher is a 53 y.o. male . Hypogonadism male - Plan: Testosterone   History of aortic valve replacement  Chronic anticoagulation - Plan: CBC  Chronic adrenal insufficiency (HCC)  Postoperative hypothyroidism  Thrombocytopenia (HCC) - Plan: CBC  Check updated testosterone  level as no recent testing and keep follow-up with endocrinology as planned for other labs/monitoring.  Followed by specialist for cardiac and endocrine concerns as above.  Continue planned follow-up, 75-month follow-up for physical.  Discussed unfortunate decline in his father's health, denies any additional needs and is handling stressors okay at this time.  Advised to let me know if I can help.   No orders of the defined types were placed in this encounter.  Patient Instructions  Thanks for coming in today.  I will check your blood counts as the platelets were borderline low on most recent testing.  I am also checking a testosterone  level as that has not been checked since last year, and with injection about a week ago now is a good time to check that level.  Other labs can be checked at your upcoming appointment with endocrinology.  No med changes at this time.  I will see you in 6 months but let me know if there are  questions in the meantime.  Sorry to hear about your dad's health.  Keep up the good work with walking, and it sounds like you have a good support system at this time, but please let me know if there are any ways I can help.  Hang in there!    Signed,   Reyes Pines, MD [facility], [facility] 08/05/23 11:16 AM

## 2023-08-10 ENCOUNTER — Ambulatory Visit

## 2023-08-11 ENCOUNTER — Ambulatory Visit (INDEPENDENT_AMBULATORY_CARE_PROVIDER_SITE_OTHER)

## 2023-08-11 DIAGNOSIS — E291 Testicular hypofunction: Secondary | ICD-10-CM

## 2023-08-11 NOTE — Progress Notes (Signed)
 Mark Gallagher is a 53 y.o. male presents to the office today for Testosterone  Cypionate injection per physician's orders. Injection was administered Intramuscular Left upper quad. gluteus.   Patient's next injection due 2 weeks, appt made? yes  [name]

## 2023-08-11 NOTE — Progress Notes (Signed)
 Patient verbalized understanding of labs. He did not have any questions or concerns at this time

## 2023-08-20 ENCOUNTER — Ambulatory Visit: Admitting: Podiatry

## 2023-08-20 ENCOUNTER — Encounter: Payer: Self-pay | Admitting: Podiatry

## 2023-08-20 DIAGNOSIS — M79676 Pain in unspecified toe(s): Secondary | ICD-10-CM

## 2023-08-20 DIAGNOSIS — B351 Tinea unguium: Secondary | ICD-10-CM

## 2023-08-20 NOTE — Progress Notes (Signed)
 He presents today chief complaint of painful elongated toenails and calluses bilaterally.  Objective: Pulses are strongly palpable.  Toenails are long thick yellow dystrophic with mycotic benign skin lesion plantar aspect subfifth metatarsal head of the right foot.  Assessment: Pain limb secondary to onychomycosis and benign skin lesions.  Plan: Debridement of benign skin lesions debridement of toenails 1 through 5 bilateral.    Remember to ask how his dad is.  He is sick with dementia and unresponsive.

## 2023-08-25 ENCOUNTER — Ambulatory Visit

## 2023-08-25 ENCOUNTER — Ambulatory Visit: Payer: Self-pay

## 2023-08-25 NOTE — Telephone Encounter (Signed)
 FYI

## 2023-08-25 NOTE — Telephone Encounter (Signed)
 Noted, appears that he just wanted to reschedule testosterone  injection, not evaluate neck pain but I am happy to see him for that if needed.

## 2023-08-25 NOTE — Telephone Encounter (Signed)
 Copied from CRM #[id number]. Topic:  FYI Only or Action Required?: FYI only for provider.  Patient was last seen in primary care on 08/05/2023 by Levora Reyes SAUNDERS, MD.  Called Nurse Triage reporting Neck Pain.  Symptoms began today.  Symptoms are: unchanged.  Triage Disposition: See PCP Within 2 Weeks  Patient/caregiver understands and will follow disposition?: No, refuses disposition       Clinical - Red Word Triage >> Aug 25, 2023 12:58 PM Suzen RAMAN wrote: Red Word that prompted transfer to Nurse Triage: back and shoulder pain        Reason for Disposition  Neck pain is a chronic symptom (recurrent or ongoing AND present > 4 weeks)  Answer Assessment - Initial Assessment Questions Patient reports pain is chronic and does not want to make an appointment to be seen for it. Patient calling in only to reschedule his testosterone  injection today.        1. ONSET: When did the pain begin?      Today  2. LOCATION: Where does it hurt?      Neck 3. PATTERN Does the pain come and go, or has it been constant since it started?      Constant  4. SEVERITY: How bad is the pain?  (Scale 0-10; or none or slight stiffness, mild, moderate, severe)     5/10 5. RADIATION: Does the pain go anywhere else, shoot into your arms?     Bilateral shoulders  6. CORD SYMPTOMS: Any weakness or numbness of the arms or legs?     No 7. CAUSE: What do you think is causing the neck pain?     History of a spinal fusion  8. NECK OVERUSE: Any recent activities that involved turning or twisting the neck?     No 9. OTHER SYMPTOMS: Do you have any other symptoms? (e.g., headache, fever, chest pain, difficulty breathing, neck swelling)     No  Protocols used: Neck Pain or Stiffness-A-AH

## 2023-08-26 DIAGNOSIS — H5212 Myopia, left eye: Secondary | ICD-10-CM | POA: Diagnosis not present

## 2023-08-27 ENCOUNTER — Ambulatory Visit

## 2023-08-27 ENCOUNTER — Ambulatory Visit (INDEPENDENT_AMBULATORY_CARE_PROVIDER_SITE_OTHER)

## 2023-08-27 DIAGNOSIS — E291 Testicular hypofunction: Secondary | ICD-10-CM

## 2023-08-27 NOTE — Progress Notes (Signed)
 Mark Gallagher is a 53 y.o. male presents to the office today for testosterone  injection per physician's orders. Injection was administered Intramuscular Right upper quad. gluteus.   Patient's next injection due 09/10/23, appt made? yes  [name]

## 2023-09-01 ENCOUNTER — Ambulatory Visit

## 2023-09-02 ENCOUNTER — Ambulatory Visit: Attending: Cardiovascular Disease

## 2023-09-02 DIAGNOSIS — Z5181 Encounter for therapeutic drug level monitoring: Secondary | ICD-10-CM

## 2023-09-02 LAB — POCT INR: INR: 2 (ref 2.0–3.0)

## 2023-09-02 NOTE — Progress Notes (Signed)
 INR 2.0 Please see anticoagulation encounter.

## 2023-09-02 NOTE — Patient Instructions (Signed)
Description   Continue taking Warfarin 3 tablets daily except for 2 tablets on Tuesdays and Saturdays. Recheck INR 6 weeks. [facility]madin Clinic [phone].       

## 2023-09-10 ENCOUNTER — Ambulatory Visit (INDEPENDENT_AMBULATORY_CARE_PROVIDER_SITE_OTHER)

## 2023-09-10 ENCOUNTER — Ambulatory Visit

## 2023-09-10 DIAGNOSIS — E291 Testicular hypofunction: Secondary | ICD-10-CM

## 2023-09-10 NOTE — Progress Notes (Addendum)
 Mark Gallagher is a 53 y.o. male presents to the office today for Testosterone  Injection per physician's orders. Injection was administered Intramuscular Right upper quad. gluteus.   Patient's next injection due 09/23/2023, appt made? yes  [facility] R Pavel Gadd

## 2023-09-23 ENCOUNTER — Ambulatory Visit

## 2023-09-23 DIAGNOSIS — E291 Testicular hypofunction: Secondary | ICD-10-CM

## 2023-09-23 NOTE — Progress Notes (Signed)
 Mark Gallagher is a 53 y.o. male presents to the office today for testosterone  injection per physician's orders. Injection was administered Intramuscular Right upper quad. gluteus.   Patient's next injection due 09/1723, appt made? yes  [name]

## 2023-09-29 ENCOUNTER — Ambulatory Visit (INDEPENDENT_AMBULATORY_CARE_PROVIDER_SITE_OTHER): Admitting: Podiatry

## 2023-09-29 ENCOUNTER — Encounter: Payer: Self-pay | Admitting: Podiatry

## 2023-09-29 DIAGNOSIS — M79676 Pain in unspecified toe(s): Secondary | ICD-10-CM | POA: Diagnosis not present

## 2023-09-29 DIAGNOSIS — D2371 Other benign neoplasm of skin of right lower limb, including hip: Secondary | ICD-10-CM | POA: Diagnosis not present

## 2023-09-29 DIAGNOSIS — B351 Tinea unguium: Secondary | ICD-10-CM

## 2023-09-29 DIAGNOSIS — D2372 Other benign neoplasm of skin of left lower limb, including hip: Secondary | ICD-10-CM | POA: Diagnosis not present

## 2023-09-29 NOTE — Progress Notes (Signed)
 He presents today chief complaint of painful elongated toenails and calluses bilaterally.  Objective: Pulses are strongly palpable.  Toenails are long thick yellow dystrophic with mycotic benign skin lesion plantar aspect subfifth metatarsal head of the right foot.  Assessment: Pain limb secondary to onychomycosis and benign skin lesions.  Plan: Debridement of benign skin lesions debridement of toenails 1 through 5 bilateral.   He states that his dad is getting worse.  He also goes on to say his niece is going to be married in [place] 

## 2023-09-30 DIAGNOSIS — M4802 Spinal stenosis, cervical region: Secondary | ICD-10-CM | POA: Diagnosis not present

## 2023-09-30 DIAGNOSIS — M4712 Other spondylosis with myelopathy, cervical region: Secondary | ICD-10-CM | POA: Diagnosis not present

## 2023-09-30 DIAGNOSIS — Z981 Arthrodesis status: Secondary | ICD-10-CM | POA: Diagnosis not present

## 2023-10-01 DIAGNOSIS — K080 Exfoliation of teeth due to systemic causes: Secondary | ICD-10-CM | POA: Diagnosis not present

## 2023-10-02 ENCOUNTER — Encounter: Payer: Self-pay | Admitting: Physician Assistant

## 2023-10-02 ENCOUNTER — Telehealth: Payer: Self-pay | Admitting: Cardiovascular Disease

## 2023-10-02 DIAGNOSIS — Z952 Presence of prosthetic heart valve: Secondary | ICD-10-CM

## 2023-10-02 MED ORDER — WARFARIN SODIUM 5 MG PO TABS: ORAL_TABLET | ORAL | 0 refills | Status: DC

## 2023-10-02 NOTE — Telephone Encounter (Signed)
 Error

## 2023-10-02 NOTE — Telephone Encounter (Signed)
*  STAT* If patient is at the pharmacy, call can be transferred to refill team.   1. Which medications need to be refilled? (please list name of each medication and dose if known) warfarin (COUMADIN ) 5 MG tablet  2. Which pharmacy/location (including street and city if local pharmacy) is medication to be sent to? [facility] [address] [place], [place] - [address]  3. Do they need a 30 day or 90 day supply? 90 day supply

## 2023-10-07 ENCOUNTER — Ambulatory Visit (INDEPENDENT_AMBULATORY_CARE_PROVIDER_SITE_OTHER)

## 2023-10-07 DIAGNOSIS — E291 Testicular hypofunction: Secondary | ICD-10-CM

## 2023-10-07 NOTE — Progress Notes (Signed)
 Mark Gallagher is a 53 y.o. male presents to the office today for testosterone  injection per physician's orders. Injection was administered Intramuscular Left upper quad. gluteus.   Patient's next injection due 10/21/23, appt made? yes  [name]

## 2023-10-14 ENCOUNTER — Ambulatory Visit: Attending: Cardiovascular Disease | Admitting: *Deleted

## 2023-10-14 DIAGNOSIS — Z5181 Encounter for therapeutic drug level monitoring: Secondary | ICD-10-CM | POA: Diagnosis not present

## 2023-10-14 LAB — POCT INR: INR: 1.4 — AB (ref 2.0–3.0)

## 2023-10-14 NOTE — Patient Instructions (Signed)
 Description   INR-1.4; Today take 1 more warfarin tablet then continue taking Warfarin 3 tablets daily except for 2 tablets on Tuesdays and Saturdays. Recheck INR 5 weeks.  Coumadin  Clinic [phone].

## 2023-10-14 NOTE — Progress Notes (Signed)
 Description   INR-1.4; Today take 1 more warfarin tablet then continue taking Warfarin 3 tablets daily except for 2 tablets on Tuesdays and Saturdays. Recheck INR 5 weeks.  Coumadin  Clinic [phone].

## 2023-10-15 DIAGNOSIS — M8580 Other specified disorders of bone density and structure, unspecified site: Secondary | ICD-10-CM | POA: Diagnosis not present

## 2023-10-15 DIAGNOSIS — E611 Iron deficiency: Secondary | ICD-10-CM | POA: Diagnosis not present

## 2023-10-15 DIAGNOSIS — E220 Acromegaly and pituitary gigantism: Secondary | ICD-10-CM | POA: Diagnosis not present

## 2023-10-15 DIAGNOSIS — M8589 Other specified disorders of bone density and structure, multiple sites: Secondary | ICD-10-CM | POA: Diagnosis not present

## 2023-10-15 DIAGNOSIS — Z79899 Other long term (current) drug therapy: Secondary | ICD-10-CM | POA: Diagnosis not present

## 2023-10-15 DIAGNOSIS — E890 Postprocedural hypothyroidism: Secondary | ICD-10-CM | POA: Diagnosis not present

## 2023-10-15 DIAGNOSIS — E2740 Unspecified adrenocortical insufficiency: Secondary | ICD-10-CM | POA: Diagnosis not present

## 2023-10-21 ENCOUNTER — Ambulatory Visit

## 2023-10-21 DIAGNOSIS — E291 Testicular hypofunction: Secondary | ICD-10-CM | POA: Diagnosis not present

## 2023-10-21 DIAGNOSIS — Z23 Encounter for immunization: Secondary | ICD-10-CM

## 2023-10-21 NOTE — Progress Notes (Signed)
 Patient is in office today for a nurse visit for Testosterone  Injection and Flu vaccine. Patient Injection was given in the  Right upper quad. gluteus. Patient tolerated injection well. Flu vaccine was given in the left deltoid.

## 2023-11-04 ENCOUNTER — Ambulatory Visit (INDEPENDENT_AMBULATORY_CARE_PROVIDER_SITE_OTHER)

## 2023-11-04 DIAGNOSIS — E291 Testicular hypofunction: Secondary | ICD-10-CM | POA: Diagnosis not present

## 2023-11-04 NOTE — Progress Notes (Signed)
 Patient is in office today for a nurse visit for Testosterone Injection. Patient Injection was given in the  Right upper quad. gluteus. Patient tolerated injection well.

## 2023-11-05 ENCOUNTER — Ambulatory Visit: Admitting: Podiatry

## 2023-11-05 DIAGNOSIS — D2372 Other benign neoplasm of skin of left lower limb, including hip: Secondary | ICD-10-CM | POA: Diagnosis not present

## 2023-11-05 DIAGNOSIS — M79676 Pain in unspecified toe(s): Secondary | ICD-10-CM | POA: Diagnosis not present

## 2023-11-05 DIAGNOSIS — B351 Tinea unguium: Secondary | ICD-10-CM | POA: Diagnosis not present

## 2023-11-05 NOTE — Progress Notes (Signed)
 He presents today chief complaint of painful elongated toenails and calluses bilaterally.  Objective: Pulses are strongly palpable.  Toenails are long thick yellow dystrophic with mycotic benign skin lesion plantar aspect subfifth metatarsal head of the right foot.  Assessment: Pain limb secondary to onychomycosis and benign skin lesions.  Plan: Debridement of benign skin lesions debridement of toenails 1 through 5 bilateral.

## 2023-11-11 DIAGNOSIS — D229 Melanocytic nevi, unspecified: Secondary | ICD-10-CM | POA: Diagnosis not present

## 2023-11-11 DIAGNOSIS — L821 Other seborrheic keratosis: Secondary | ICD-10-CM | POA: Diagnosis not present

## 2023-11-11 DIAGNOSIS — L814 Other melanin hyperpigmentation: Secondary | ICD-10-CM | POA: Diagnosis not present

## 2023-11-11 DIAGNOSIS — L578 Other skin changes due to chronic exposure to nonionizing radiation: Secondary | ICD-10-CM | POA: Diagnosis not present

## 2023-11-18 ENCOUNTER — Ambulatory Visit

## 2023-11-18 DIAGNOSIS — E291 Testicular hypofunction: Secondary | ICD-10-CM

## 2023-11-18 NOTE — Progress Notes (Signed)
 Patient is in office today for a nurse visit for Testosterone Injection. Patient Injection was given in the  Right upper quad. gluteus. Patient tolerated injection well.

## 2023-11-19 ENCOUNTER — Ambulatory Visit: Attending: Cardiovascular Disease

## 2023-11-19 DIAGNOSIS — Z5181 Encounter for therapeutic drug level monitoring: Secondary | ICD-10-CM | POA: Diagnosis not present

## 2023-11-19 LAB — POCT INR: INR: 1.7 — AB (ref 2.0–3.0)

## 2023-11-19 NOTE — Progress Notes (Signed)
 INR 1.7  Please see anticoagulation encounter continue taking Warfarin 3 tablets daily except for 2 tablets on Tuesdays and Saturdays. Recheck INR 6 weeks.  Coumadin  Clinic [phone].

## 2023-11-19 NOTE — Patient Instructions (Signed)
continue taking Warfarin 3 tablets daily except for 2 tablets on Tuesdays and Saturdays. Recheck INR 6 weeks.  [facility] [phone].

## 2023-11-26 ENCOUNTER — Ambulatory Visit: Admitting: Student in an Organized Health Care Education/Training Program

## 2023-12-01 ENCOUNTER — Encounter

## 2023-12-02 ENCOUNTER — Ambulatory Visit (INDEPENDENT_AMBULATORY_CARE_PROVIDER_SITE_OTHER)

## 2023-12-02 DIAGNOSIS — E349 Endocrine disorder, unspecified: Secondary | ICD-10-CM | POA: Diagnosis not present

## 2023-12-02 DIAGNOSIS — E291 Testicular hypofunction: Secondary | ICD-10-CM

## 2023-12-02 MED ORDER — TESTOSTERONE CYPIONATE 200 MG/ML IM SOLN: 200.0000 mg | INTRAMUSCULAR | Status: DC

## 2023-12-02 NOTE — Progress Notes (Signed)
 Mark Gallagher is a 53 y.o. male presents in office today for a nurse visit for Testosterone  Injection.   Patient Injection was given in the  Left upper quad. gluteus. Patient tolerated injection well.   Patient's next injection due 12/16/2023, appt made? yes  [facility] R Olia Hinderliter

## 2023-12-04 ENCOUNTER — Ambulatory Visit (INDEPENDENT_AMBULATORY_CARE_PROVIDER_SITE_OTHER): Admitting: Student in an Organized Health Care Education/Training Program

## 2023-12-04 VITALS — BP 132/78 | HR 73 | Temp 98.0°F | Ht 78.0 in | Wt 252.0 lb

## 2023-12-04 DIAGNOSIS — H6091 Unspecified otitis externa, right ear: Secondary | ICD-10-CM | POA: Insufficient documentation

## 2023-12-04 DIAGNOSIS — H6120 Impacted cerumen, unspecified ear: Secondary | ICD-10-CM | POA: Insufficient documentation

## 2023-12-04 DIAGNOSIS — H60501 Unspecified acute noninfective otitis externa, right ear: Secondary | ICD-10-CM

## 2023-12-04 DIAGNOSIS — H6123 Impacted cerumen, bilateral: Secondary | ICD-10-CM

## 2023-12-04 MED ORDER — NEOMYCIN-POLYMYXIN-HC 3.5-10000-1 OT SOLN: 4.0000 [drp] | Freq: Four times a day (QID) | OTIC | 0 refills | Status: AC

## 2023-12-04 NOTE — Assessment & Plan Note (Signed)
 He experiences intermittent sensations of water , echo, and vibration in the right ear. The ear canal is inflamed and bleeding following wax removal, suggesting possible otitis externa.  Unclear etiology of the otitis externa, patient does not swim.  The shape of the ear canal is affected by acromegaly. Prescribed eardrops for the right ear, to be used four times daily for one week. Advised to take ibuprofen  for discomfort. He should monitor for decreased hearing or worsening discomfort and report if symptoms persist.

## 2023-12-04 NOTE — Progress Notes (Signed)
 Acute Office Visit  Patient ID: Mark Gallagher, male    DOB: 01/19/1971, 53 y.o.   MRN: [id number]  PCP: Levora Reyes SAUNDERS, MD  Chief Complaint  Patient presents with   Ear Fullness    Notes some fullness in ears and would like an assessment on if this is from trapped water  or wax     Subjective:     HPI  Discussed the use of AI scribe software for clinical note transcription with the patient, who gave verbal consent to proceed.  History of Present Illness Mark Gallagher is a 53 year old male with acromegaly who presents with a sensation of water  in the right ear.  He experiences a sensation of water  in his right ear, described as an echo or vibration, occurring intermittently over the past week. There is no associated pain, recent illness, sinus symptoms, or congestion. He notes a slight decrease in hearing during these episodes, which resolves when the symptoms improve.  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 He recalls that surgical removal of the pituitary tumor was attempted, but the procedure was stopped due to excessive bleeding. He experienced significant growth during adolescence, reaching a height of 6'8 by college graduation. He underwent spinal fusion in 2007 and noted a decrease in height by two inches in 2016.  He maintains physical activity by walking nine to ten miles a week to prevent mobility issues. He does not swim, which is relevant to his current ear symptoms.      Objective:    BP 132/78   Pulse 73   Temp 98 F (36.7 C) (Temporal)   Ht 6' 6 (1.981 m)   Wt 252 lb (114.3 kg)   SpO2 97%   BMI 29.12 kg/m   Physical Exam  Gen: Tall man with classic features of acromegaly Ears: On the left ear was impacted with cerumen, this was removed with a curette, ear canal was very superior and long, tympanic membrane appears gray without effusion.  The right ear canal also is very superior and long.  There  is a small amount of cerumen that was removed with a curette.  The ear canal itself looked inflamed, there is a small amount of bleeding after even gentle curette.  Not much tenderness on exam or with a curette.  Tympanic membrane appeared gray, no effusion or erythema.     Assessment & Plan:   Problem List Items Addressed This Visit       Unprioritized   Otitis externa of right ear - Primary   He experiences intermittent sensations of water , echo, and vibration in the right ear. The ear canal is inflamed and bleeding following wax removal, suggesting possible otitis externa.  Unclear etiology of the otitis externa, patient does not swim.  The shape of the ear canal is affected by acromegaly. Prescribed eardrops for the right ear, to be used four times daily for one week. Advised to take ibuprofen  for discomfort. He should monitor for decreased hearing or worsening discomfort and report if symptoms persist.       Relevant Medications   neomycin-polymyxin-hydrocortisone  (CORTISPORIN) OTIC solution   Cerumen impaction   Procedure Note: Manual Removal of Impacted Cerumen Using a Curette   Indication:  Cerumen impaction causing symptoms (e.g., hearing loss, pain, tinnitus) or preventing assessment of the ear canal and tympanic membrane.  Procedure:  Explained the procedure to the patient and informed consent was obtained  Review  patient history for contraindications (e.g., nonintact tympanic membrane, history of ear surgery, anatomical abnormalities).  After a position of the patient's head upright, I visualized the ear canal and cerumen using an otoscope.  I gently inserted the curette into the ear canal avoiding contact with the canal walls, and carefully scooped the cerumen, removing it in small pieces.  I reassessed the ear canal and tympanic membrane, there was no residual cerumen nor signs of trauma.  Follow-Up:  I instructed the patient to report any persistent symptoms such as pain,  discharge, or hearing loss.  Schedule a follow-up appointment if necessary.        Meds ordered this encounter  Medications   neomycin-polymyxin-hydrocortisone  (CORTISPORIN) OTIC solution    Sig: Place 4 drops into the right ear 4 (four) times daily.    Dispense:  10 mL    Refill:  0    Return if symptoms worsen or fail to improve.  Cleatus Debby Specking, MD [facility]

## 2023-12-04 NOTE — Patient Instructions (Signed)
  VISIT SUMMARY: Today, you were seen for a sensation of water  in your right ear. 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 During the visit, we discussed your ear symptoms and reviewed your medical history.  YOUR PLAN: -OTITIS EXTERNA, RIGHT EAR: Otitis externa is an inflammation of the outer ear canal. You are experiencing intermittent sensations of water , echo, and vibration in your right ear, likely due to a viral infection or wax buildup. The shape of your ear canal, affected by acromegaly, may contribute to these symptoms. You have been prescribed eardrops to use four times daily for one week and advised to take ibuprofen  for any discomfort. Please monitor for any decreased hearing or worsening discomfort and report if symptoms persist.  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 You were diagnosed in 1994 and exhibit classic features of this condition. No changes to your current management plan were made today.  INSTRUCTIONS: Please use the prescribed eardrops for your right ear four times daily for one week and take ibuprofen  as needed for discomfort. Monitor for any decreased hearing or worsening discomfort and report if symptoms persist. Your prescription has been sent to [facility].

## 2023-12-04 NOTE — Assessment & Plan Note (Signed)
 Procedure Note: Manual Removal of Impacted Cerumen Using a Curette   Indication:  Cerumen impaction causing symptoms (e.g., hearing loss, pain, tinnitus) or preventing assessment of the ear canal and tympanic membrane.  Procedure:  Explained the procedure to the patient and informed consent was obtained  Review patient history for contraindications (e.g., nonintact tympanic membrane, history of ear surgery, anatomical abnormalities).  After a position of the patient's head upright, I visualized the ear canal and cerumen using an otoscope.  I gently inserted the curette into the ear canal avoiding contact with the canal walls, and carefully scooped the cerumen, removing it in small pieces.  I reassessed the ear canal and tympanic membrane, there was no residual cerumen nor signs of trauma.  Follow-Up:   I instructed the patient to report any persistent symptoms such as pain, discharge, or hearing loss.  Schedule a follow-up appointment if necessary.

## 2023-12-10 ENCOUNTER — Ambulatory Visit: Admitting: Podiatry

## 2023-12-15 DIAGNOSIS — D497 Neoplasm of unspecified behavior of endocrine glands and other parts of nervous system: Secondary | ICD-10-CM | POA: Diagnosis not present

## 2023-12-16 ENCOUNTER — Ambulatory Visit

## 2023-12-16 DIAGNOSIS — E291 Testicular hypofunction: Secondary | ICD-10-CM | POA: Diagnosis not present

## 2023-12-16 NOTE — Progress Notes (Signed)
 Patient is in office today for a nurse visit for Testosterone Injection. Patient Injection was given in the  Right upper quad. gluteus. Patient tolerated injection well.

## 2023-12-30 ENCOUNTER — Ambulatory Visit (INDEPENDENT_AMBULATORY_CARE_PROVIDER_SITE_OTHER)

## 2023-12-30 DIAGNOSIS — E291 Testicular hypofunction: Secondary | ICD-10-CM | POA: Diagnosis not present

## 2023-12-30 NOTE — Progress Notes (Addendum)
 Patient is in office today for a nurse visit for Testosterone  Injection. Patient Injection was given in the  Left upper quad. gluteus. Patient tolerated injection well.

## 2023-12-31 ENCOUNTER — Ambulatory Visit

## 2024-01-05 ENCOUNTER — Encounter: Payer: Self-pay | Admitting: Podiatry

## 2024-01-05 ENCOUNTER — Ambulatory Visit: Admitting: Podiatry

## 2024-01-05 DIAGNOSIS — D2372 Other benign neoplasm of skin of left lower limb, including hip: Secondary | ICD-10-CM | POA: Diagnosis not present

## 2024-01-05 DIAGNOSIS — D2371 Other benign neoplasm of skin of right lower limb, including hip: Secondary | ICD-10-CM

## 2024-01-05 DIAGNOSIS — M79676 Pain in unspecified toe(s): Secondary | ICD-10-CM | POA: Diagnosis not present

## 2024-01-05 DIAGNOSIS — B351 Tinea unguium: Secondary | ICD-10-CM

## 2024-01-05 NOTE — Progress Notes (Signed)
 He presents today chief complaint of painful elongated toenails and calluses bilaterally.  Objective: Pulses are strongly palpable.  Toenails are long thick yellow dystrophic with mycotic benign skin lesion plantar aspect subfifth metatarsal head of the right foot.  Assessment: Pain limb secondary to onychomycosis and benign skin lesions.  Plan: Debridement of benign skin lesions debridement of toenails 1 through 5 bilateral.

## 2024-01-12 ENCOUNTER — Ambulatory Visit

## 2024-01-12 ENCOUNTER — Telehealth: Payer: Self-pay

## 2024-01-12 DIAGNOSIS — Z952 Presence of prosthetic heart valve: Secondary | ICD-10-CM

## 2024-01-12 DIAGNOSIS — E291 Testicular hypofunction: Secondary | ICD-10-CM | POA: Diagnosis not present

## 2024-01-12 DIAGNOSIS — Z5181 Encounter for therapeutic drug level monitoring: Secondary | ICD-10-CM

## 2024-01-12 LAB — POCT INR: INR: 1.8 — AB (ref 2.0–3.0)

## 2024-01-12 MED ORDER — WARFARIN SODIUM 5 MG PO TABS: ORAL_TABLET | ORAL | 0 refills | Status: AC

## 2024-01-12 NOTE — Progress Notes (Signed)
 Mark Gallagher is a 53 y.o. male presents in office today for a nurse visit for Testosterone  Injection.   Patient Injection was given in the  Right upper quad. gluteus. Patient tolerated injection well.   Patient's next injection due 01/27/2024, appt made? yes  [name] R [name]

## 2024-01-12 NOTE — Progress Notes (Signed)
 Description   INR-1.8; Continue taking Warfarin 3 tablets daily except for 2 tablets on Tuesdays and Saturdays. Recheck INR 5 weeks with MD appt.  Coumadin  Clinic [phone].

## 2024-01-12 NOTE — Patient Instructions (Signed)
 Description   INR-1.8; Continue taking Warfarin 3 tablets daily except for 2 tablets on Tuesdays and Saturdays. Recheck INR 5 weeks with MD appt.  Coumadin  Clinic [phone].

## 2024-01-12 NOTE — Telephone Encounter (Signed)
 Called patient and gathered needed information to finish documenting patient injection

## 2024-01-12 NOTE — Telephone Encounter (Signed)
 Called patient, he was just in for testosterone  injection and I forgot to write down information from the vial, Left Vm for patient to return my call.  Information from vial that is needed is   NDC Number ( top of box or top of vial)   Lot number   Expiration date of vial   I can not complete nurse visit until I have this information.

## 2024-01-27 ENCOUNTER — Ambulatory Visit

## 2024-01-27 DIAGNOSIS — E291 Testicular hypofunction: Secondary | ICD-10-CM

## 2024-01-27 NOTE — Progress Notes (Signed)
 Mark Gallagher is a 54 y.o. male presents in office today for a nurse visit for Testosterone  Injection.   Patient Injection was given in the  Right upper quad. gluteus. Patient tolerated injection well.   Patient's next injection due 02/11/2024, appt made? yes  [facility] R Daanya Lanphier

## 2024-02-11 ENCOUNTER — Ambulatory Visit: Admitting: Podiatry

## 2024-02-11 ENCOUNTER — Ambulatory Visit

## 2024-02-11 ENCOUNTER — Encounter: Admitting: Family Medicine

## 2024-02-12 ENCOUNTER — Ambulatory Visit

## 2024-02-12 ENCOUNTER — Ambulatory Visit: Attending: Cardiovascular Disease

## 2024-02-12 DIAGNOSIS — E291 Testicular hypofunction: Secondary | ICD-10-CM

## 2024-02-12 DIAGNOSIS — Z5181 Encounter for therapeutic drug level monitoring: Secondary | ICD-10-CM

## 2024-02-12 LAB — POCT INR: INR: 1.5 — AB (ref 2.0–3.0)

## 2024-02-12 NOTE — Progress Notes (Signed)
 Patient is in office today for a nurse visit for Testosterone Injection. Patient Injection was given in the  Right upper quad. gluteus. Patient tolerated injection well.

## 2024-02-12 NOTE — Progress Notes (Signed)
 INR 1.5  Continue taking Warfarin 3 tablets daily except for 2 tablets on Tuesdays and Saturdays. Recheck INR 6 weeks Coumadin  Clinic [phone].

## 2024-02-12 NOTE — Patient Instructions (Signed)
Continue taking Warfarin 3 tablets daily except for 2 tablets on Tuesdays and Saturdays. Recheck INR 6 weeks.  Coumadin Clinic [phone].

## 2024-02-15 ENCOUNTER — Ambulatory Visit

## 2024-02-15 ENCOUNTER — Ambulatory Visit: Admitting: Cardiovascular Disease

## 2024-02-23 ENCOUNTER — Ambulatory Visit: Admitting: Podiatry

## 2024-02-26 ENCOUNTER — Ambulatory Visit

## 2024-02-26 NOTE — Progress Notes (Signed)
 Patient is in office today for a nurse visit for Testosterone  Injection. Patient Injection was given in the  Left upper quad. gluteus. Patient tolerated injection well.

## 2024-03-02 ENCOUNTER — Ambulatory Visit: Admitting: Cardiovascular Disease

## 2024-03-04 ENCOUNTER — Ambulatory Visit

## 2024-03-08 ENCOUNTER — Ambulatory Visit: Admitting: Podiatry

## 2024-03-11 ENCOUNTER — Ambulatory Visit

## 2024-03-24 ENCOUNTER — Ambulatory Visit

## 2024-04-15 ENCOUNTER — Encounter: Admitting: Family Medicine
